# Patient Record
Sex: Female | Born: 1950
Health system: Southern US, Community
[De-identification: ages and names within clinical notes are randomized; demographics above are authoritative.]

## PROBLEM LIST (undated history)

## (undated) DIAGNOSIS — Z9989 Dependence on other enabling machines and devices: Secondary | ICD-10-CM

## (undated) DIAGNOSIS — R413 Other amnesia: Secondary | ICD-10-CM

## (undated) DIAGNOSIS — I10 Essential (primary) hypertension: Secondary | ICD-10-CM

## (undated) DIAGNOSIS — R531 Weakness: Secondary | ICD-10-CM

## (undated) DIAGNOSIS — K279 Peptic ulcer, site unspecified, unspecified as acute or chronic, without hemorrhage or perforation: Secondary | ICD-10-CM

## (undated) DIAGNOSIS — Z972 Presence of dental prosthetic device (complete) (partial): Secondary | ICD-10-CM

## (undated) DIAGNOSIS — J302 Other seasonal allergic rhinitis: Secondary | ICD-10-CM

## (undated) DIAGNOSIS — I5020 Unspecified systolic (congestive) heart failure: Secondary | ICD-10-CM

## (undated) DIAGNOSIS — E119 Type 2 diabetes mellitus without complications: Secondary | ICD-10-CM

## (undated) DIAGNOSIS — I2510 Atherosclerotic heart disease of native coronary artery without angina pectoris: Secondary | ICD-10-CM

## (undated) HISTORY — PX: CHOLECYSTECTOMY: SHX55

---

## 2019-09-02 DIAGNOSIS — I639 Cerebral infarction, unspecified: Secondary | ICD-10-CM

## 2019-09-02 DIAGNOSIS — I219 Acute myocardial infarction, unspecified: Secondary | ICD-10-CM

## 2019-09-02 HISTORY — DX: Cerebral infarction, unspecified: I63.9

## 2019-09-02 HISTORY — DX: Acute myocardial infarction, unspecified: I21.9

## 2019-09-26 ENCOUNTER — Other Ambulatory Visit: Payer: Self-pay

## 2019-09-26 ENCOUNTER — Emergency Department: Payer: Medicare Other

## 2019-09-26 ENCOUNTER — Inpatient Hospital Stay
Admission: EM | Admit: 2019-09-26 | Discharge: 2019-09-27 | DRG: 281 | Disposition: A | Discharge status: Other Acute Inpatient Hospital | Payer: Medicare Other | Attending: Internal Medicine | Admitting: Internal Medicine

## 2019-09-26 DIAGNOSIS — I252 Old myocardial infarction: Secondary | ICD-10-CM

## 2019-09-26 DIAGNOSIS — E876 Hypokalemia: Secondary | ICD-10-CM | POA: Diagnosis present

## 2019-09-26 DIAGNOSIS — I214 Non-ST elevation (NSTEMI) myocardial infarction: Secondary | ICD-10-CM | POA: Diagnosis present

## 2019-09-26 DIAGNOSIS — Z79899 Other long term (current) drug therapy: Secondary | ICD-10-CM | POA: Diagnosis not present

## 2019-09-26 DIAGNOSIS — Z20822 Contact with and (suspected) exposure to covid-19: Secondary | ICD-10-CM | POA: Diagnosis present

## 2019-09-26 DIAGNOSIS — Z823 Family history of stroke: Secondary | ICD-10-CM

## 2019-09-26 DIAGNOSIS — Z8711 Personal history of peptic ulcer disease: Secondary | ICD-10-CM

## 2019-09-26 DIAGNOSIS — Z9049 Acquired absence of other specified parts of digestive tract: Secondary | ICD-10-CM | POA: Diagnosis not present

## 2019-09-26 DIAGNOSIS — Z8249 Family history of ischemic heart disease and other diseases of the circulatory system: Secondary | ICD-10-CM

## 2019-09-26 DIAGNOSIS — R112 Nausea with vomiting, unspecified: Secondary | ICD-10-CM | POA: Diagnosis present

## 2019-09-26 DIAGNOSIS — I5031 Acute diastolic (congestive) heart failure: Secondary | ICD-10-CM

## 2019-09-26 DIAGNOSIS — R778 Other specified abnormalities of plasma proteins: Secondary | ICD-10-CM

## 2019-09-26 DIAGNOSIS — I5022 Chronic systolic (congestive) heart failure: Secondary | ICD-10-CM | POA: Diagnosis present

## 2019-09-26 DIAGNOSIS — I25118 Atherosclerotic heart disease of native coronary artery with other forms of angina pectoris: Secondary | ICD-10-CM | POA: Diagnosis not present

## 2019-09-26 DIAGNOSIS — K30 Functional dyspepsia: Secondary | ICD-10-CM | POA: Diagnosis present

## 2019-09-26 DIAGNOSIS — I2510 Atherosclerotic heart disease of native coronary artery without angina pectoris: Secondary | ICD-10-CM | POA: Diagnosis present

## 2019-09-26 DIAGNOSIS — Z807 Family history of other malignant neoplasms of lymphoid, hematopoietic and related tissues: Secondary | ICD-10-CM

## 2019-09-26 DIAGNOSIS — R0789 Other chest pain: Secondary | ICD-10-CM

## 2019-09-26 HISTORY — DX: Peptic ulcer, site unspecified, unspecified as acute or chronic, without hemorrhage or perforation: K27.9

## 2019-09-26 HISTORY — DX: Other seasonal allergic rhinitis: J30.2

## 2019-09-26 LAB — PROTIME-INR
INR: 1.1 (ref 0.8–1.2)
Prothrombin Time: 13.9 seconds (ref 11.4–15.2)

## 2019-09-26 LAB — CBC
HCT: 44.8 % (ref 36.0–46.0)
Hemoglobin: 15.3 g/dL — ABNORMAL HIGH (ref 12.0–15.0)
MCH: 31.7 pg (ref 26.0–34.0)
MCHC: 34.2 g/dL (ref 30.0–36.0)
MCV: 92.8 fL (ref 80.0–100.0)
Platelets: 229 10*3/uL (ref 150–400)
RBC: 4.83 MIL/uL (ref 3.87–5.11)
RDW: 13.8 % (ref 11.5–15.5)
WBC: 4.1 10*3/uL (ref 4.0–10.5)
nRBC: 0 % (ref 0.0–0.2)

## 2019-09-26 LAB — COMPREHENSIVE METABOLIC PANEL
ALT: 30 U/L (ref 0–44)
AST: 68 U/L — ABNORMAL HIGH (ref 15–41)
Albumin: 3.2 g/dL — ABNORMAL LOW (ref 3.5–5.0)
Alkaline Phosphatase: 85 U/L (ref 38–126)
Anion gap: 16 — ABNORMAL HIGH (ref 5–15)
BUN: 10 mg/dL (ref 8–23)
CO2: 18 mmol/L — ABNORMAL LOW (ref 22–32)
Calcium: 9.2 mg/dL (ref 8.9–10.3)
Chloride: 105 mmol/L (ref 98–111)
Creatinine, Ser: 0.77 mg/dL (ref 0.44–1.00)
GFR calc Af Amer: >60 mL/min (ref >60)
GFR calc non Af Amer: >60 mL/min (ref >60)
Glucose, Bld: 128 mg/dL — ABNORMAL HIGH (ref 70–99)
Potassium: 3 mmol/L — ABNORMAL LOW (ref 3.5–5.1)
Sodium: 139 mmol/L (ref 135–145)
Total Bilirubin: 1.2 mg/dL (ref 0.3–1.2)
Total Protein: 6.3 g/dL — ABNORMAL LOW (ref 6.5–8.1)

## 2019-09-26 LAB — TROPONIN I (HIGH SENSITIVITY)
Troponin I (High Sensitivity): 674 ng/L (ref <18)
Troponin I (High Sensitivity): 567 ng/L (ref <18)

## 2019-09-26 LAB — HEMOGLOBIN A1C
Hgb A1c MFr Bld: 5.9 % — ABNORMAL HIGH (ref 4.8–5.6)
Mean Plasma Glucose: 122.63 mg/dL

## 2019-09-26 LAB — LIPASE, BLOOD: Lipase: 25 U/L (ref 11–51)

## 2019-09-26 LAB — APTT: aPTT: 40 seconds — ABNORMAL HIGH (ref 24–36)

## 2019-09-26 MED ORDER — IOHEXOL 300 MG/ML  SOLN
100.0000 mL | Freq: Once | INTRAMUSCULAR | Status: AC | PRN
  Administered 2019-09-26: 100 mL via INTRAVENOUS

## 2019-09-26 MED ORDER — SODIUM CHLORIDE 0.9 % IV SOLN: INTRAVENOUS | Status: DC

## 2019-09-26 MED ORDER — HEPARIN (PORCINE) 25000 UT/250ML-% IV SOLN
1000.0000 [IU]/h | INTRAVENOUS | Status: DC
  Administered 2019-09-26: 1000 [IU]/h via INTRAVENOUS
  Filled 2019-09-26 (×2): qty 250

## 2019-09-26 MED ORDER — ALPRAZOLAM 0.5 MG PO TABS: 0.2500 mg | ORAL_TABLET | Freq: Two times a day (BID) | ORAL | Status: DC | PRN

## 2019-09-26 MED ORDER — ONDANSETRON HCL 4 MG/2ML IJ SOLN
4.0000 mg | Freq: Once | INTRAMUSCULAR | Status: AC
  Administered 2019-09-26: 4 mg via INTRAVENOUS
  Filled 2019-09-26: qty 2

## 2019-09-26 MED ORDER — NITROGLYCERIN 0.4 MG SL SUBL: 0.4000 mg | SUBLINGUAL_TABLET | SUBLINGUAL | Status: DC | PRN

## 2019-09-26 MED ORDER — SODIUM CHLORIDE 0.9% FLUSH
3.0000 mL | Freq: Once | INTRAVENOUS | Status: AC
  Administered 2019-09-26: 3 mL via INTRAVENOUS

## 2019-09-26 MED ORDER — METOPROLOL TARTRATE 5 MG/5ML IV SOLN
5.0000 mg | Freq: Once | INTRAVENOUS | Status: AC
  Administered 2019-09-26: 5 mg via INTRAVENOUS
  Filled 2019-09-26: qty 5

## 2019-09-26 MED ORDER — ASPIRIN EC 81 MG PO TBEC
81.0000 mg | DELAYED_RELEASE_TABLET | Freq: Every day | ORAL | Status: DC
  Administered 2019-09-27: 81 mg via ORAL
  Filled 2019-09-26: qty 1

## 2019-09-26 MED ORDER — ZOLPIDEM TARTRATE 5 MG PO TABS: 5.0000 mg | ORAL_TABLET | Freq: Every evening | ORAL | Status: DC | PRN

## 2019-09-26 MED ORDER — SODIUM CHLORIDE 0.9 % IV BOLUS
500.0000 mL | Freq: Once | INTRAVENOUS | Status: AC
  Administered 2019-09-26: 500 mL via INTRAVENOUS

## 2019-09-26 MED ORDER — METOPROLOL TARTRATE 25 MG PO TABS
12.5000 mg | ORAL_TABLET | Freq: Two times a day (BID) | ORAL | Status: DC
  Administered 2019-09-26 – 2019-09-27 (×2): 12.5 mg via ORAL
  Filled 2019-09-26 (×2): qty 1

## 2019-09-26 MED ORDER — ACETAMINOPHEN 325 MG PO TABS: 650.0000 mg | ORAL_TABLET | ORAL | Status: DC | PRN

## 2019-09-26 MED ORDER — ONDANSETRON HCL 4 MG/2ML IJ SOLN: 4.0000 mg | Freq: Four times a day (QID) | INTRAMUSCULAR | Status: DC | PRN

## 2019-09-26 MED ORDER — PROMETHAZINE HCL 25 MG/ML IJ SOLN: 12.5000 mg | Freq: Four times a day (QID) | INTRAMUSCULAR | Status: DC | PRN

## 2019-09-26 MED ORDER — HEPARIN BOLUS VIA INFUSION
3800.0000 [IU] | Freq: Once | INTRAVENOUS | Status: AC
  Administered 2019-09-26: 3800 [IU] via INTRAVENOUS
  Filled 2019-09-26: qty 3800

## 2019-09-26 MED ORDER — POTASSIUM CHLORIDE CRYS ER 20 MEQ PO TBCR
40.0000 meq | EXTENDED_RELEASE_TABLET | Freq: Once | ORAL | Status: AC
  Administered 2019-09-26: 40 meq via ORAL
  Filled 2019-09-26: qty 2

## 2019-09-26 MED ORDER — ASPIRIN 81 MG PO CHEW
324.0000 mg | CHEWABLE_TABLET | Freq: Once | ORAL | Status: AC
  Administered 2019-09-26: 324 mg via ORAL
  Filled 2019-09-26: qty 4

## 2019-09-26 MED ORDER — ATORVASTATIN CALCIUM 80 MG PO TABS
80.0000 mg | ORAL_TABLET | Freq: Every day | ORAL | Status: DC
  Administered 2019-09-26 – 2019-09-27 (×2): 80 mg via ORAL
  Filled 2019-09-26: qty 1
  Filled 2019-09-26: qty 4

## 2019-09-26 NOTE — H&P (Addendum)
[place] [facility]   PATIENT NAME: Brittney Kennedy    MR#:  [id number]  DATE OF BIRTH:  [birth year]  DATE OF ADMISSION:  09/26/2019  PRIMARY CARE PHYSICIAN: Patient, No Pcp Per   REQUESTING/REFERRING PHYSICIAN: Willy Eddy, MD  CHIEF COMPLAINT:   Chief Complaint  Patient presents with  . Nausea  . Emesis    HISTORY OF PRESENT ILLNESS:  Brittney Kennedy  is a 69 y.o. Caucasian female with no known medical problems, who presented to the emergency room with cancer of intermittent nausea and vomiting over the last week as well as epigastric pain that started about a couple weeks ago and has been intermittent as well.  Symptoms have been significantly worse since yesterday.  She had nausea and vomiting x3 yesterday with significant diminished p.o. intake today.  She admits to dry cough without wheezing or dyspnea.  Her pain has been epigastric and substernal area with no radiation.  She denies any leg pain, recent travels or surgeries but does have lower leg and ankle edema.   No fever or chills.  She admits to constipation and denies any melena or bright red bleeding per rectum.  No other bleeding diathesis.  Upon presentation to the emergency room, blood pressure was 154/66 with otherwise normal vital signs.  Labs revealed low potassium of 3 and serum lipase was 25.  Anion gap was 16 and troponin 6.3 with albumin of 3.2.  Initial troponin I was 674 and later 567.  CBC was unremarkable.  INR is 1.1.  COVID-19 PCR is currently pending.  Two-view chest x-ray showed borderline cardiomegaly and bronchitic changes with minimal atelectasis and small pleural effusion at the left base.  EKG showed Sinus tachycardia with rate 103 with Q waves anteroseptally and T wave inversion laterally.  The patient was given 40 mEq p.o. potassium chloride and 4 of aspirin as well as 4 mg of IV Zofran and 500 mill IV normal saline bolus.  She was then started on IV heparin with bolus and drip after  results of her troponin I.  She will be admitted to a telemetry bed for further evaluation and management. PAST MEDICAL HISTORY:  History reviewed. No pertinent past medical history.  She denied any previous medical problems.  PAST SURGICAL HISTORY:   Past Surgical History:  Procedure Laterality Date  . CHOLECYSTECTOMY    Left rotator cuff tear repair.  SOCIAL HISTORY:   Social History   Tobacco Use  . Smoking status: Never Smoker  . Smokeless tobacco: Never Used  Substance Use Topics  . Alcohol use: Never    FAMILY HISTORY:  No family history on file. Her mother died from CVA.  Paternal grandmother had MI. DRUG ALLERGIES:   Allergies  Allergen Reactions  . No [employer] Food Allergies Anaphylaxis and Hives    Raw onions    REVIEW OF SYSTEMS:   ROS As per history of present illness. All pertinent systems were reviewed above. Constitutional,  HEENT, cardiovascular, respiratory, GI, GU, musculoskeletal, neuro, psychiatric, endocrine,  integumentary and hematologic systems were reviewed and are otherwise  negative/unremarkable except for positive findings mentioned above in the HPI.   MEDICATIONS AT HOME:   Prior to Admission medications   Medication Sig Start Date End Date Taking? Authorizing Provider  Ascorbic Acid (VITAMIN C) 1000 MG tablet Take 1,000 mg by mouth daily.   Yes [provider]  cholecalciferol (VITAMIN D3) 25 MCG (1000 UNIT) tablet Take 1,000 Units by mouth daily.  Yes [provider]  Echinacea 125 MG CAPS Take 2 capsules by mouth in the morning and at bedtime.   Yes [provider]  loratadine (CLARITIN) 10 MG tablet Take 10 mg by mouth daily.   Yes [provider]  Multiple Vitamin (MULTIVITAMIN) capsule Take 1 capsule by mouth daily.   Yes [provider]      VITAL SIGNS:  Blood pressure (!) 156/93, pulse (!) 111, temperature 97.9 F (36.6 C), temperature source Oral, resp. rate 20, height 5\' 2"   (1.575 m), weight 68 kg, SpO2 100 %.  PHYSICAL EXAMINATION:  Physical Exam  GENERAL:  69 y.o.-year-old Caucasian female patient lying in the bed with no acute distress.  EYES: Pupils equal, round, reactive to light and accommodation. No scleral icterus. Extraocular muscles intact.  HEENT: Head atraumatic, normocephalic. Oropharynx and nasopharynx clear.  NECK:  Supple, no jugular venous distention. No thyroid enlargement, no tenderness.  LUNGS: Normal breath sounds bilaterally, no wheezing, rales,rhonchi or crepitation. No use of accessory muscles of respiration.  CARDIOVASCULAR: Regular rate and rhythm, S1, S2 normal. No murmurs, rubs, or gallops.  ABDOMEN: Soft, nondistended, nontender. Bowel sounds present. No organomegaly or mass.  EXTREMITIES: 1-2+ bilateral ankle and lower leg pitting edema, with no cyanosis, or clubbing.  NEUROLOGIC: Cranial nerves II through XII are intact. Muscle strength 5/5 in all extremities. Sensation intact. Gait not checked.  PSYCHIATRIC: The patient is alert and oriented x 3.  Normal affect and good eye contact. SKIN: No obvious rash, lesion, or ulcer.   LABORATORY PANEL:   CBC Recent Labs  Lab 09/26/19 1647  WBC 4.1  HGB 15.3*  HCT 44.8  PLT 229   ------------------------------------------------------------------------------------------------------------------  Chemistries  Recent Labs  Lab 09/26/19 1647  NA 139  K 3.0*  CL 105  CO2 18*  GLUCOSE 128*  BUN 10  CREATININE 0.77  CALCIUM 9.2  AST 68*  ALT 30  ALKPHOS 85  BILITOT 1.2   ------------------------------------------------------------------------------------------------------------------  Cardiac Enzymes No results for input(s): TROPONINI in the last 168 hours. ------------------------------------------------------------------------------------------------------------------  RADIOLOGY:  DG Chest 2 View  Result Date: 09/26/2019 CLINICAL DATA:  Upper epigastric pain,  nausea, vomiting for couple weeks, unable to keep much down EXAM: CHEST - 2 VIEW COMPARISON:  None FINDINGS: Borderline enlargement of cardiac silhouette. Mediastinal contours and pulmonary vascularity normal. Atherosclerotic calcification aorta. Bronchitic changes without pulmonary infiltrate. Minimal atelectasis and pleural effusion at LEFT base. No pneumothorax. Bones demineralized. IMPRESSION: Borderline enlargement of cardiac silhouette. Bronchitic changes with minimal atelectasis and small pleural effusion at LEFT base. Electronically Signed   By: Lavonia Dana M.D.   On: 09/26/2019 20:19   CT ABDOMEN PELVIS W CONTRAST  Result Date: 09/26/2019 CLINICAL DATA:  69 year old female with nausea vomiting. Concern for bowel obstruction. EXAM: CT ABDOMEN AND PELVIS WITH CONTRAST TECHNIQUE: Multidetector CT imaging of the abdomen and pelvis was performed using the standard protocol following bolus administration of intravenous contrast. CONTRAST:  114mL OMNIPAQUE IOHEXOL 300 MG/ML  SOLN COMPARISON:  None. FINDINGS: Lower chest: Partially visualized small bilateral pleural effusions with associated partial compressive atelectasis the lower lobes. Pneumonia is not excluded. Clinical correlation is recommended. There is mild cardiomegaly. No intra-abdominal free air or free fluid. Hepatobiliary: Fatty infiltration of the liver with findings of early cirrhosis. No intrahepatic biliary ductal dilatation. Cholecystectomy. No retained calcified stone noted the central CBD. Pancreas: Unremarkable. No pancreatic ductal dilatation or surrounding inflammatory changes. Spleen: Normal in size without focal abnormality. Adrenals/Urinary Tract: The adrenal glands are unremarkable.  There is no hydronephrosis on either side. There is symmetric enhancement and excretion of contrast by both kidneys. The visualized ureters and urinary bladder appear unremarkable. Stomach/Bowel: There is no bowel obstruction or active inflammation. The  appendix is normal. Vascular/Lymphatic: Moderate aortoiliac atherosclerotic disease. IVC is unremarkable. No portal venous gas. Mildly enlarged lymph node in the porta hepatic measuring 14 mm. Reproductive: The uterus and ovaries are grossly unremarkable. Other: None Musculoskeletal: Osteopenia with degenerative changes of the spine. There is a transitional anatomy with grade 1 anterolisthesis. No acute osseous pathology. IMPRESSION: 1. No acute intra-abdominal or pelvic pathology. No bowel obstruction. Normal appendix. 2. Fatty liver with findings of early cirrhosis. 3. Partially visualized small bilateral pleural effusions and partial compressive atelectasis of the lower lobes. Pneumonia is not excluded. Clinical correlation is recommended. 4. Aortic Atherosclerosis (ICD10-I70.0). Electronically Signed   By: Elgie Collard M.D.   On: 09/26/2019 19:21      IMPRESSION AND PLAN:   1.  Non-ST elevation myocardial infarction. -The patient will be admitted to a telemetry bed. -We will continue her on IV heparin drip. -We will place her on aspirin as well as as needed sublingual nitroglycerin and morphine sulfate for pain. -We will add high-dose statin therapy and give beta-blocker therapy with IV and p.o. -2D echo will be obtained in a.m. -Cardiology consult will be obtained in a.m.  with [facility].  I notified Dr. Chilton Si about the patient.  2.  Hypokalemia. -Potassium will be replaced and magnesium level will be checked.  3.  Recurrent nausea and vomiting. -The patient will be placed on p.r.n. Antiemetics.  4.  Possible new onset acute CHF likely diastolic. -The patient will be placed on diuresis with IV Lasix. -We will check  BNP. -2D echo will be obtained and cardiology consult as mentioned above.  5.  DVT prophylaxis. -The patient will be placed on IV heparin drip as mentioned above.  All the records are reviewed and case discussed with ED provider. The plan of care was discussed  in details with the patient (and family). I answered all questions. The patient agreed to proceed with the above mentioned plan. Further management will depend upon hospital course.   CODE STATUS: Full.    The patient is admitted to inpatient status due to the intensity of medical problems, potential risks involved and management requirement that will necessitate more than 2 midnights.  TOTAL TIME TAKING CARE OF THIS PATIENT: 55 minutes.    Hannah Beat M.D on 09/26/2019 at 10:51 PM  [facility]   From 7 PM-7 AM, contact night-coverage [email or web address]  CC: Primary care physician; Patient, No Pcp Per   Note: This dictation was prepared with Dragon dictation along with smaller phrase technology. Any transcriptional errors that result from this process are unintentional.

## 2019-09-26 NOTE — Progress Notes (Signed)
Report called to shari 2A

## 2019-09-26 NOTE — ED Notes (Signed)
This RN attempted IV x2 for second line, unable to get access. Pt is difficult stick. IV team consult placed.

## 2019-09-26 NOTE — ED Triage Notes (Signed)
Pt comes via POV from home with c/o upper gastric pain, N/V. Pt states this has been going on for couple weeks now.  Pt states she hasn't been able to keep much of anything down. Pt not sure if it is her ulcer or what.

## 2019-09-26 NOTE — ED Notes (Signed)
Pt to CT

## 2019-09-26 NOTE — ED Provider Notes (Signed)
[facility] Emergency Department Provider Note    First MD Initiated Contact with Patient 09/26/19 1722     (approximate)  I have reviewed the triage vital signs and the nursing notes.   HISTORY  Chief Complaint Nausea and Emesis    HPI Zen Felling is a 69 y.o. female history of cholecystectomy presents the ER for little over a week of progressively worsening nausea and vomiting having difficulty keeping anything down associated with epigastric pain.  No diarrhea.  She is still passing gas.  Denies any flank pain.  No pain with taking deep inspiration.  Denies any chest pain.  Has not taken anything for discomfort or nausea at home.    History reviewed. No pertinent past medical history. No family history on file. Past Surgical History:  Procedure Laterality Date  . CHOLECYSTECTOMY     Patient Active Problem List   Diagnosis Date Noted  . NSTEMI (non-ST elevated myocardial infarction) (HCC) 09/26/2019      Prior to Admission medications   Medication Sig Start Date End Date Taking? Authorizing Provider  Ascorbic Acid (VITAMIN C) 1000 MG tablet Take 1,000 mg by mouth daily.   Yes [provider]  cholecalciferol (VITAMIN D3) 25 MCG (1000 UNIT) tablet Take 1,000 Units by mouth daily.   Yes [provider]  Echinacea 125 MG CAPS Take 2 capsules by mouth in the morning and at bedtime.   Yes [provider]  loratadine (CLARITIN) 10 MG tablet Take 10 mg by mouth daily.   Yes [provider]  Multiple Vitamin (MULTIVITAMIN) capsule Take 1 capsule by mouth daily.   Yes [provider]    Allergies No [employer]alt[employer] food allergies    Social History Social History   Tobacco Use  . Smoking status: Never Smoker  . Smokeless tobacco: Never Used  Substance Use Topics  . Alcohol use: Never  . Drug use: Never    Review of Systems Patient denies headaches, rhinorrhea, blurry vision, numbness, shortness of  breath, chest pain, edema, cough, abdominal pain, nausea, vomiting, diarrhea, dysuria, fevers, rashes or hallucinations unless otherwise stated above in HPI. ____________________________________________   PHYSICAL EXAM:  VITAL SIGNS: Vitals:   09/26/19 2227 09/26/19 2336  BP: (!) 156/93 (!) 135/93  Pulse: (!) 111 92  Resp: 20   Temp: 97.9 F (36.6 C) 98.5 F (36.9 C)  SpO2:  100%    Constitutional: Alert and oriented.  Eyes: Conjunctivae are normal.  Head: Atraumatic. Nose: No congestion/rhinnorhea. Mouth/Throat: Mucous membranes are moist.   Neck: No stridor. Painless ROM.  Cardiovascular: Normal rate, regular rhythm. Grossly normal heart sounds.  Good peripheral circulation. Respiratory: Normal respiratory effort.  No retractions. Lungs CTAB. Gastrointestinal: Soft , limited exam 2/2 obesity. No distention. No abdominal bruits. No CVA tenderness. Genitourinary:  Musculoskeletal: No lower extremity tenderness nor edema.  No joint effusions. Neurologic:  Normal speech and language. No gross focal neurologic deficits are appreciated. No facial droop Skin:  Skin is warm, dry and intact. No rash noted. Psychiatric: Mood and affect are normal. Speech and behavior are normal.  ____________________________________________   LABS (all labs ordered are listed, but only abnormal results are displayed)  Results for orders placed or performed during the hospital encounter of 09/26/19 (from the past 24 hour(s))  Lipase, blood     Status: None   Collection Time: 09/26/19  4:47 PM  Result Value Ref Range   Lipase 25 11 - 51 U/L  Comprehensive metabolic panel  Status: Abnormal   Collection Time: 09/26/19  4:47 PM  Result Value Ref Range   Sodium 139 135 - 145 mmol/L   Potassium 3.0 (L) 3.5 - 5.1 mmol/L   Chloride 105 98 - 111 mmol/L   CO2 18 (L) 22 - 32 mmol/L   Glucose, Bld 128 (H) 70 - 99 mg/dL   BUN 10 8 - 23 mg/dL   Creatinine, Ser 0.77 0.44 - 1.00 mg/dL   Calcium 9.2 8.9  - 10.3 mg/dL   Total Protein 6.3 (L) 6.5 - 8.1 g/dL   Albumin 3.2 (L) 3.5 - 5.0 g/dL   AST 68 (H) 15 - 41 U/L   ALT 30 0 - 44 U/L   Alkaline Phosphatase 85 38 - 126 U/L   Total Bilirubin 1.2 0.3 - 1.2 mg/dL   GFR calc non Af Amer >60 >60 mL/min   GFR calc Af Amer >60 >60 mL/min   Anion gap 16 (H) 5 - 15  CBC     Status: Abnormal   Collection Time: 09/26/19  4:47 PM  Result Value Ref Range   WBC 4.1 4.0 - 10.5 K/uL   RBC 4.83 3.87 - 5.11 MIL/uL   Hemoglobin 15.3 (H) 12.0 - 15.0 g/dL   HCT 44.8 36.0 - 46.0 %   MCV 92.8 80.0 - 100.0 fL   MCH 31.7 26.0 - 34.0 pg   MCHC 34.2 30.0 - 36.0 g/dL   RDW 13.8 11.5 - 15.5 %   Platelets 229 150 - 400 K/uL   nRBC 0.0 0.0 - 0.2 %  Troponin I (High Sensitivity)     Status: Abnormal   Collection Time: 09/26/19  6:48 PM  Result Value Ref Range   Troponin I (High Sensitivity) 674 (HH) <18 ng/L  Troponin I (High Sensitivity)     Status: Abnormal   Collection Time: 09/26/19  8:48 PM  Result Value Ref Range   Troponin I (High Sensitivity) 567 (HH) <18 ng/L  Hemoglobin A1c     Status: Abnormal   Collection Time: 09/26/19  8:48 PM  Result Value Ref Range   Hgb A1c MFr Bld 5.9 (H) 4.8 - 5.6 %   Mean Plasma Glucose 122.63 mg/dL  Protime-INR     Status: None   Collection Time: 09/26/19  8:48 PM  Result Value Ref Range   Prothrombin Time 13.9 11.4 - 15.2 seconds   INR 1.1 0.8 - 1.2  APTT     Status: Abnormal   Collection Time: 09/26/19  8:48 PM  Result Value Ref Range   aPTT 40 (H) 24 - 36 seconds   ____________________________________________  EKG My review and personal interpretation at Time: 16:50   Indication: nausea  Rate: 100  Rhythm: sinus Axis: normal Other: poor r wave progression, non specific st abn ____________________________________________  RADIOLOGY  I personally reviewed all radiographic images ordered to evaluate for the above acute complaints and reviewed radiology reports and findings.  These findings were personally  discussed with the patient.  Please see medical record for radiology report.  ____________________________________________   PROCEDURES  Procedure(s) performed:  .Critical Care Performed by: Merlyn Lot, MD Authorized by: Merlyn Lot, MD   Critical care provider statement:    Critical care time (minutes):  30   Critical care time was exclusive of:  Separately billable procedures and treating other patients   Critical care was necessary to treat or prevent imminent or life-threatening deterioration of the following conditions:  Cardiac failure   Critical care was time  spent personally by me on the following activities:  Development of treatment plan with patient or surrogate, discussions with consultants, evaluation of patient's response to treatment, examination of patient, obtaining history from patient or surrogate, ordering and performing treatments and interventions, ordering and review of laboratory studies, ordering and review of radiographic studies, pulse oximetry, re-evaluation of patient's condition and review of old charts      Critical Care performed: yes ____________________________________________   INITIAL IMPRESSION / ASSESSMENT AND PLAN / ED COURSE  Pertinent labs & imaging results that were available during my care of the patient were reviewed by me and considered in my medical decision making (see chart for details).   DDX: enteritis, gastritis, pud, colitis, sbo, diverticulitis  Brittney Kennedy is a 69 y.o. who presents to the ED with symptoms as described above.  Patient presented initially with complaint nausea vomiting epigastric pain since yesterday.  Blood work reassuring.  She denied any chest pain on my initial evaluation therefore CT imaging was ordered to evaluate for acute intra-abdominal process.  Troponin was added on to blood work.  CT imaging does not show any evidence of acute abnormality troponin was elevated at 600.  Further questioning  she is stating that she was having some chest discomfort yesterday related to the nausea and epigastric discomfort.  I suspect this is some atypical presentation of ACS will give aspirin as well as heparinize.  She is currently chest pain-free.  Has some nonspecific changes on EKG initially.  Clinical Course as of Mar 26 0001  Thu Sep 26, 2019  5456 Repeat EKG does not show any significant changes from prior but there are some depressions in lateral leads concerning for NSTEMI given her presentation.  She is current pain-free.  States that she was having some chest pain and pressure yesterday.  CT imaging is reassuring.  Will order aspirin as well as heparin.  Will discuss with hospitalist.   [PR]    Clinical Course User Index [PR] Willy Eddy, MD    The patient was evaluated in Emergency Department today for the symptoms described in the history of present illness. He/she was evaluated in the context of the global COVID-19 pandemic, which necessitated consideration that the patient might be at risk for infection with the SARS-CoV-2 virus that causes COVID-19. Institutional protocols and algorithms that pertain to the evaluation of patients at risk for COVID-19 are in a state of rapid change based on information released by regulatory bodies including the CDC and federal and state organizations. These policies and algorithms were followed during the patient's care in the ED.  As part of my medical decision making, I reviewed the following data within the electronic MEDICAL RECORD NUMBER Nursing notes reviewed and incorporated, Labs reviewed, notes from prior ED visits and [place] Controlled Substance Database   ____________________________________________   FINAL CLINICAL IMPRESSION(S) / ED DIAGNOSES  Final diagnoses:  Nausea and vomiting, intractability of vomiting not specified, unspecified vomiting type  Atypical chest pain  Elevated troponin I level      NEW MEDICATIONS STARTED DURING  THIS VISIT:  Current Discharge Medication List       Note:  This document was prepared using Dragon voice recognition software and may include unintentional dictation errors.    Willy Eddy, MD 09/27/19 0001

## 2019-09-26 NOTE — Progress Notes (Signed)
ANTICOAGULATION CONSULT NOTE - Initial Consult  Pharmacy Consult for Heparin  Indication: chest pain/ACS  Not on File  Patient Measurements: Height: 5\' 2"  (157.5 cm) Weight: 150 lb (68 kg) IBW/kg (Calculated) : 50.1 Heparin Dosing Weight: 64.2 kg   Vital Signs: Temp: 97.9 F (36.6 C) (03/25 1644) BP: 144/90 (03/25 2028) Pulse Rate: 101 (03/25 2028)  Labs: Recent Labs    09/26/19 1647 09/26/19 1848  HGB 15.3*  --   HCT 44.8  --   PLT 229  --   CREATININE 0.77  --   TROPONINIHS  --  674*    Estimated Creatinine Clearance: 61.7 mL/min (by C-G formula based on SCr of 0.77 mg/dL).   Medical History: History reviewed. No pertinent past medical history.  Medications:  (Not in a hospital admission)   Assessment: Pharmacy consulted to dose heparin in this 69 year old female admitted with ACS/NSTEMI.  CrCl = 61.7 ml/min    No prior anticoag noted.   Goal of Therapy:  Heparin level 0.3-0.7 units/ml Monitor platelets by anticoagulation protocol: Yes   Plan:  Give 3800 units bolus x 1 Start heparin infusion at 1000 units/hr Check anti-Xa level in 8 hours and daily while on heparin Continue to monitor H&H and platelets  Agape Hardiman D 09/26/2019,8:35 PM

## 2019-09-27 ENCOUNTER — Inpatient Hospital Stay (HOSPITAL_COMMUNITY)
Admission: AD | Admit: 2019-09-27 | Discharge: 2019-10-16 | DRG: 246 | Disposition: A | Discharge status: Rehabilitation Facility | Payer: Medicare Other | Source: Other Acute Inpatient Hospital | Attending: Internal Medicine | Admitting: Internal Medicine

## 2019-09-27 ENCOUNTER — Encounter: Payer: Self-pay | Admitting: Internal Medicine

## 2019-09-27 ENCOUNTER — Inpatient Hospital Stay (HOSPITAL_COMMUNITY)
Admit: 2019-09-27 | Discharge: 2019-09-27 | Disposition: A | Discharge status: Home / Self Care | Payer: Medicare Other | Attending: Family Medicine | Admitting: Family Medicine

## 2019-09-27 ENCOUNTER — Encounter
Admission: EM | Disposition: A | Discharge status: Other Acute Inpatient Hospital | Payer: Self-pay | Source: Home / Self Care | Attending: Internal Medicine

## 2019-09-27 ENCOUNTER — Encounter: Payer: Self-pay | Admitting: Family Medicine

## 2019-09-27 DIAGNOSIS — I110 Hypertensive heart disease with heart failure: Secondary | ICD-10-CM | POA: Diagnosis present

## 2019-09-27 DIAGNOSIS — I63511 Cerebral infarction due to unspecified occlusion or stenosis of right middle cerebral artery: Secondary | ICD-10-CM | POA: Diagnosis not present

## 2019-09-27 DIAGNOSIS — R778 Other specified abnormalities of plasma proteins: Secondary | ICD-10-CM

## 2019-09-27 DIAGNOSIS — Z6829 Body mass index (BMI) 29.0-29.9, adult: Secondary | ICD-10-CM

## 2019-09-27 DIAGNOSIS — I361 Nonrheumatic tricuspid (valve) insufficiency: Secondary | ICD-10-CM | POA: Diagnosis not present

## 2019-09-27 DIAGNOSIS — R109 Unspecified abdominal pain: Secondary | ICD-10-CM

## 2019-09-27 DIAGNOSIS — Y92230 Patient room in hospital as the place of occurrence of the external cause: Secondary | ICD-10-CM | POA: Diagnosis not present

## 2019-09-27 DIAGNOSIS — R0989 Other specified symptoms and signs involving the circulatory and respiratory systems: Secondary | ICD-10-CM | POA: Diagnosis not present

## 2019-09-27 DIAGNOSIS — R350 Frequency of micturition: Secondary | ICD-10-CM | POA: Diagnosis not present

## 2019-09-27 DIAGNOSIS — R1312 Dysphagia, oropharyngeal phase: Secondary | ICD-10-CM | POA: Diagnosis present

## 2019-09-27 DIAGNOSIS — R414 Neurologic neglect syndrome: Secondary | ICD-10-CM | POA: Diagnosis not present

## 2019-09-27 DIAGNOSIS — R4701 Aphasia: Secondary | ICD-10-CM | POA: Diagnosis not present

## 2019-09-27 DIAGNOSIS — I69392 Facial weakness following cerebral infarction: Secondary | ICD-10-CM | POA: Diagnosis not present

## 2019-09-27 DIAGNOSIS — G4700 Insomnia, unspecified: Secondary | ICD-10-CM | POA: Diagnosis not present

## 2019-09-27 DIAGNOSIS — H16201 Unspecified keratoconjunctivitis, right eye: Secondary | ICD-10-CM | POA: Diagnosis present

## 2019-09-27 DIAGNOSIS — I2510 Atherosclerotic heart disease of native coronary artery without angina pectoris: Secondary | ICD-10-CM

## 2019-09-27 DIAGNOSIS — Z20822 Contact with and (suspected) exposure to covid-19: Secondary | ICD-10-CM | POA: Diagnosis not present

## 2019-09-27 DIAGNOSIS — E46 Unspecified protein-calorie malnutrition: Secondary | ICD-10-CM | POA: Diagnosis not present

## 2019-09-27 DIAGNOSIS — Z01810 Encounter for preprocedural cardiovascular examination: Secondary | ICD-10-CM | POA: Diagnosis not present

## 2019-09-27 DIAGNOSIS — Z951 Presence of aortocoronary bypass graft: Secondary | ICD-10-CM

## 2019-09-27 DIAGNOSIS — I10 Essential (primary) hypertension: Secondary | ICD-10-CM | POA: Diagnosis not present

## 2019-09-27 DIAGNOSIS — I214 Non-ST elevation (NSTEMI) myocardial infarction: Secondary | ICD-10-CM | POA: Diagnosis not present

## 2019-09-27 DIAGNOSIS — I25118 Atherosclerotic heart disease of native coronary artery with other forms of angina pectoris: Secondary | ICD-10-CM | POA: Diagnosis present

## 2019-09-27 DIAGNOSIS — E440 Moderate protein-calorie malnutrition: Secondary | ICD-10-CM | POA: Diagnosis present

## 2019-09-27 DIAGNOSIS — R7303 Prediabetes: Secondary | ICD-10-CM | POA: Diagnosis not present

## 2019-09-27 DIAGNOSIS — I472 Ventricular tachycardia: Secondary | ICD-10-CM | POA: Diagnosis present

## 2019-09-27 DIAGNOSIS — I5021 Acute systolic (congestive) heart failure: Secondary | ICD-10-CM | POA: Diagnosis present

## 2019-09-27 DIAGNOSIS — E785 Hyperlipidemia, unspecified: Secondary | ICD-10-CM | POA: Diagnosis present

## 2019-09-27 DIAGNOSIS — Z823 Family history of stroke: Secondary | ICD-10-CM

## 2019-09-27 DIAGNOSIS — R1319 Other dysphagia: Secondary | ICD-10-CM | POA: Diagnosis not present

## 2019-09-27 DIAGNOSIS — I69354 Hemiplegia and hemiparesis following cerebral infarction affecting left non-dominant side: Secondary | ICD-10-CM | POA: Diagnosis present

## 2019-09-27 DIAGNOSIS — K922 Gastrointestinal hemorrhage, unspecified: Secondary | ICD-10-CM | POA: Diagnosis not present

## 2019-09-27 DIAGNOSIS — Z4659 Encounter for fitting and adjustment of other gastrointestinal appliance and device: Secondary | ICD-10-CM

## 2019-09-27 DIAGNOSIS — Z8711 Personal history of peptic ulcer disease: Secondary | ICD-10-CM | POA: Diagnosis not present

## 2019-09-27 DIAGNOSIS — N390 Urinary tract infection, site not specified: Secondary | ICD-10-CM | POA: Diagnosis not present

## 2019-09-27 DIAGNOSIS — Z807 Family history of other malignant neoplasms of lymphoid, hematopoietic and related tissues: Secondary | ICD-10-CM

## 2019-09-27 DIAGNOSIS — I63413 Cerebral infarction due to embolism of bilateral middle cerebral arteries: Secondary | ICD-10-CM | POA: Diagnosis not present

## 2019-09-27 DIAGNOSIS — I959 Hypotension, unspecified: Secondary | ICD-10-CM | POA: Diagnosis not present

## 2019-09-27 DIAGNOSIS — G8194 Hemiplegia, unspecified affecting left nondominant side: Secondary | ICD-10-CM | POA: Diagnosis not present

## 2019-09-27 DIAGNOSIS — E8809 Other disorders of plasma-protein metabolism, not elsewhere classified: Secondary | ICD-10-CM | POA: Diagnosis not present

## 2019-09-27 DIAGNOSIS — K269 Duodenal ulcer, unspecified as acute or chronic, without hemorrhage or perforation: Secondary | ICD-10-CM | POA: Diagnosis not present

## 2019-09-27 DIAGNOSIS — B9689 Other specified bacterial agents as the cause of diseases classified elsewhere: Secondary | ICD-10-CM | POA: Diagnosis not present

## 2019-09-27 DIAGNOSIS — N179 Acute kidney failure, unspecified: Secondary | ICD-10-CM | POA: Diagnosis not present

## 2019-09-27 DIAGNOSIS — T40605A Adverse effect of unspecified narcotics, initial encounter: Secondary | ICD-10-CM | POA: Diagnosis not present

## 2019-09-27 DIAGNOSIS — I63411 Cerebral infarction due to embolism of right middle cerebral artery: Secondary | ICD-10-CM | POA: Diagnosis not present

## 2019-09-27 DIAGNOSIS — Z955 Presence of coronary angioplasty implant and graft: Secondary | ICD-10-CM | POA: Diagnosis not present

## 2019-09-27 DIAGNOSIS — I69322 Dysarthria following cerebral infarction: Secondary | ICD-10-CM | POA: Diagnosis not present

## 2019-09-27 DIAGNOSIS — E1151 Type 2 diabetes mellitus with diabetic peripheral angiopathy without gangrene: Secondary | ICD-10-CM | POA: Diagnosis not present

## 2019-09-27 DIAGNOSIS — Z9109 Other allergy status, other than to drugs and biological substances: Secondary | ICD-10-CM | POA: Diagnosis not present

## 2019-09-27 DIAGNOSIS — R112 Nausea with vomiting, unspecified: Secondary | ICD-10-CM

## 2019-09-27 DIAGNOSIS — R410 Disorientation, unspecified: Secondary | ICD-10-CM | POA: Diagnosis not present

## 2019-09-27 DIAGNOSIS — I749 Embolism and thrombosis of unspecified artery: Secondary | ICD-10-CM | POA: Diagnosis not present

## 2019-09-27 DIAGNOSIS — G9341 Metabolic encephalopathy: Secondary | ICD-10-CM | POA: Diagnosis not present

## 2019-09-27 DIAGNOSIS — I428 Other cardiomyopathies: Secondary | ICD-10-CM | POA: Diagnosis present

## 2019-09-27 DIAGNOSIS — Y838 Other surgical procedures as the cause of abnormal reaction of the patient, or of later complication, without mention of misadventure at the time of the procedure: Secondary | ICD-10-CM | POA: Diagnosis present

## 2019-09-27 DIAGNOSIS — K264 Chronic or unspecified duodenal ulcer with hemorrhage: Secondary | ICD-10-CM | POA: Diagnosis not present

## 2019-09-27 DIAGNOSIS — R7309 Other abnormal glucose: Secondary | ICD-10-CM | POA: Diagnosis not present

## 2019-09-27 DIAGNOSIS — E669 Obesity, unspecified: Secondary | ICD-10-CM | POA: Diagnosis present

## 2019-09-27 DIAGNOSIS — G8929 Other chronic pain: Secondary | ICD-10-CM | POA: Diagnosis not present

## 2019-09-27 DIAGNOSIS — K219 Gastro-esophageal reflux disease without esophagitis: Secondary | ICD-10-CM | POA: Diagnosis present

## 2019-09-27 DIAGNOSIS — K5900 Constipation, unspecified: Secondary | ICD-10-CM | POA: Diagnosis not present

## 2019-09-27 DIAGNOSIS — I5023 Acute on chronic systolic (congestive) heart failure: Secondary | ICD-10-CM | POA: Diagnosis not present

## 2019-09-27 DIAGNOSIS — R4182 Altered mental status, unspecified: Secondary | ICD-10-CM | POA: Diagnosis not present

## 2019-09-27 DIAGNOSIS — R471 Dysarthria and anarthria: Secondary | ICD-10-CM | POA: Diagnosis not present

## 2019-09-27 DIAGNOSIS — I69391 Dysphagia following cerebral infarction: Secondary | ICD-10-CM | POA: Diagnosis not present

## 2019-09-27 DIAGNOSIS — E870 Hyperosmolality and hypernatremia: Secondary | ICD-10-CM | POA: Diagnosis not present

## 2019-09-27 DIAGNOSIS — I700 Atherosclerosis of aorta: Secondary | ICD-10-CM | POA: Diagnosis present

## 2019-09-27 DIAGNOSIS — I340 Nonrheumatic mitral (valve) insufficiency: Secondary | ICD-10-CM

## 2019-09-27 DIAGNOSIS — R140 Abdominal distension (gaseous): Secondary | ICD-10-CM | POA: Diagnosis present

## 2019-09-27 DIAGNOSIS — Z781 Physical restraint status: Secondary | ICD-10-CM

## 2019-09-27 DIAGNOSIS — M19012 Primary osteoarthritis, left shoulder: Secondary | ICD-10-CM | POA: Diagnosis not present

## 2019-09-27 DIAGNOSIS — H16291 Other keratoconjunctivitis, right eye: Secondary | ICD-10-CM | POA: Diagnosis present

## 2019-09-27 DIAGNOSIS — E876 Hypokalemia: Secondary | ICD-10-CM | POA: Diagnosis not present

## 2019-09-27 DIAGNOSIS — E43 Unspecified severe protein-calorie malnutrition: Secondary | ICD-10-CM | POA: Diagnosis present

## 2019-09-27 DIAGNOSIS — K760 Fatty (change of) liver, not elsewhere classified: Secondary | ICD-10-CM | POA: Diagnosis present

## 2019-09-27 DIAGNOSIS — Z79899 Other long term (current) drug therapy: Secondary | ICD-10-CM

## 2019-09-27 DIAGNOSIS — W19XXXA Unspecified fall, initial encounter: Secondary | ICD-10-CM | POA: Diagnosis not present

## 2019-09-27 DIAGNOSIS — I639 Cerebral infarction, unspecified: Secondary | ICD-10-CM | POA: Diagnosis not present

## 2019-09-27 DIAGNOSIS — I6340 Cerebral infarction due to embolism of unspecified cerebral artery: Secondary | ICD-10-CM | POA: Diagnosis present

## 2019-09-27 DIAGNOSIS — J302 Other seasonal allergic rhinitis: Secondary | ICD-10-CM | POA: Diagnosis present

## 2019-09-27 DIAGNOSIS — K279 Peptic ulcer, site unspecified, unspecified as acute or chronic, without hemorrhage or perforation: Secondary | ICD-10-CM

## 2019-09-27 DIAGNOSIS — N3000 Acute cystitis without hematuria: Secondary | ICD-10-CM | POA: Diagnosis not present

## 2019-09-27 DIAGNOSIS — H16209 Unspecified keratoconjunctivitis, unspecified eye: Secondary | ICD-10-CM | POA: Diagnosis not present

## 2019-09-27 DIAGNOSIS — K7460 Unspecified cirrhosis of liver: Secondary | ICD-10-CM | POA: Diagnosis present

## 2019-09-27 DIAGNOSIS — I97638 Postprocedural hematoma of a circulatory system organ or structure following other circulatory system procedure: Secondary | ICD-10-CM | POA: Diagnosis not present

## 2019-09-27 DIAGNOSIS — D62 Acute posthemorrhagic anemia: Secondary | ICD-10-CM | POA: Diagnosis not present

## 2019-09-27 DIAGNOSIS — S7011XA Contusion of right thigh, initial encounter: Secondary | ICD-10-CM | POA: Diagnosis present

## 2019-09-27 DIAGNOSIS — M79609 Pain in unspecified limb: Secondary | ICD-10-CM | POA: Diagnosis not present

## 2019-09-27 DIAGNOSIS — I6601 Occlusion and stenosis of right middle cerebral artery: Secondary | ICD-10-CM | POA: Diagnosis not present

## 2019-09-27 DIAGNOSIS — I252 Old myocardial infarction: Secondary | ICD-10-CM

## 2019-09-27 DIAGNOSIS — M25512 Pain in left shoulder: Secondary | ICD-10-CM | POA: Diagnosis not present

## 2019-09-27 DIAGNOSIS — L7632 Postprocedural hematoma of skin and subcutaneous tissue following other procedure: Secondary | ICD-10-CM | POA: Diagnosis present

## 2019-09-27 DIAGNOSIS — E1165 Type 2 diabetes mellitus with hyperglycemia: Secondary | ICD-10-CM | POA: Diagnosis not present

## 2019-09-27 DIAGNOSIS — R29810 Facial weakness: Secondary | ICD-10-CM | POA: Diagnosis not present

## 2019-09-27 DIAGNOSIS — R443 Hallucinations, unspecified: Secondary | ICD-10-CM | POA: Diagnosis not present

## 2019-09-27 DIAGNOSIS — N319 Neuromuscular dysfunction of bladder, unspecified: Secondary | ICD-10-CM | POA: Diagnosis not present

## 2019-09-27 HISTORY — DX: Atherosclerotic heart disease of native coronary artery without angina pectoris: I25.10

## 2019-09-27 HISTORY — PX: LEFT HEART CATH AND CORS/GRAFTS ANGIOGRAPHY: CATH118250

## 2019-09-27 HISTORY — DX: Unspecified systolic (congestive) heart failure: I50.20

## 2019-09-27 LAB — URINALYSIS, COMPLETE (UACMP) WITH MICROSCOPIC
Bacteria, UA: NONE SEEN
Glucose, UA: NEGATIVE mg/dL
Leukocytes,Ua: NEGATIVE
Nitrite: NEGATIVE
Protein, ur: >300 mg/dL — AB
Specific Gravity, Urine: 1.02 (ref 1.005–1.030)
pH: 5.5 (ref 5.0–8.0)

## 2019-09-27 LAB — CBC WITH DIFFERENTIAL/PLATELET
Abs Immature Granulocytes: 0.03 10*3/uL (ref 0.00–0.07)
Basophils Absolute: 0 10*3/uL (ref 0.0–0.1)
Basophils Relative: 1 %
Eosinophils Absolute: 0.1 10*3/uL (ref 0.0–0.5)
Eosinophils Relative: 2 %
HCT: 39.7 % (ref 36.0–46.0)
Hemoglobin: 13.3 g/dL (ref 12.0–15.0)
Immature Granulocytes: 1 %
Lymphocytes Relative: 27 %
Lymphs Abs: 1.4 10*3/uL (ref 0.7–4.0)
MCH: 31.7 pg (ref 26.0–34.0)
MCHC: 33.5 g/dL (ref 30.0–36.0)
MCV: 94.5 fL (ref 80.0–100.0)
Monocytes Absolute: 0.7 10*3/uL (ref 0.1–1.0)
Monocytes Relative: 13 %
Neutro Abs: 2.9 10*3/uL (ref 1.7–7.7)
Neutrophils Relative %: 56 %
Platelets: 214 10*3/uL (ref 150–400)
RBC: 4.2 MIL/uL (ref 3.87–5.11)
RDW: 14 % (ref 11.5–15.5)
WBC: 5.1 10*3/uL (ref 4.0–10.5)
nRBC: 0 % (ref 0.0–0.2)

## 2019-09-27 LAB — BASIC METABOLIC PANEL
Anion gap: 14 (ref 5–15)
BUN: 10 mg/dL (ref 8–23)
CO2: 16 mmol/L — ABNORMAL LOW (ref 22–32)
Calcium: 8.6 mg/dL — ABNORMAL LOW (ref 8.9–10.3)
Chloride: 113 mmol/L — ABNORMAL HIGH (ref 98–111)
Creatinine, Ser: 0.71 mg/dL (ref 0.44–1.00)
GFR calc Af Amer: >60 mL/min (ref >60)
GFR calc non Af Amer: >60 mL/min (ref >60)
Glucose, Bld: 112 mg/dL — ABNORMAL HIGH (ref 70–99)
Potassium: 3.5 mmol/L (ref 3.5–5.1)
Sodium: 143 mmol/L (ref 135–145)

## 2019-09-27 LAB — TROPONIN I (HIGH SENSITIVITY): Troponin I (High Sensitivity): 583 ng/L (ref <18)

## 2019-09-27 LAB — LIPID PANEL
Cholesterol: 97 mg/dL (ref 0–200)
HDL: 21 mg/dL — ABNORMAL LOW (ref >40)
LDL Cholesterol: 21 mg/dL (ref 0–99)
Total CHOL/HDL Ratio: 4.6 RATIO
Triglycerides: 273 mg/dL — ABNORMAL HIGH (ref <150)
VLDL: 55 mg/dL — ABNORMAL HIGH (ref 0–40)

## 2019-09-27 LAB — HEPARIN LEVEL (UNFRACTIONATED)
Heparin Unfractionated: 0.45 IU/mL (ref 0.30–0.70)
Heparin Unfractionated: 0.45 IU/mL (ref 0.30–0.70)

## 2019-09-27 LAB — ECHOCARDIOGRAM COMPLETE
Height: 62 in
Weight: 2611.2 oz

## 2019-09-27 LAB — SARS CORONAVIRUS 2 (TAT 6-24 HRS): SARS Coronavirus 2: NEGATIVE

## 2019-09-27 LAB — HIV ANTIBODY (ROUTINE TESTING W REFLEX): HIV Screen 4th Generation wRfx: NONREACTIVE

## 2019-09-27 SURGERY — LEFT HEART CATH AND CORS/GRAFTS ANGIOGRAPHY
Anesthesia: Moderate Sedation

## 2019-09-27 MED ORDER — ONDANSETRON HCL 4 MG/2ML IJ SOLN: 4.0000 mg | Freq: Four times a day (QID) | INTRAMUSCULAR | Status: DC | PRN

## 2019-09-27 MED ORDER — ASPIRIN EC 81 MG PO TBEC
81.0000 mg | DELAYED_RELEASE_TABLET | Freq: Every day | ORAL | Status: DC
  Administered 2019-09-28 – 2019-09-30 (×3): 81 mg via ORAL
  Filled 2019-09-27 (×3): qty 1

## 2019-09-27 MED ORDER — SODIUM CHLORIDE 0.9 % IV SOLN: INTRAVENOUS | Status: DC

## 2019-09-27 MED ORDER — HEPARIN (PORCINE) IN NACL 1000-0.9 UT/500ML-% IV SOLN
INTRAVENOUS | Status: DC | PRN
  Administered 2019-09-27: 500 mL

## 2019-09-27 MED ORDER — SODIUM CHLORIDE 0.9% FLUSH
3.0000 mL | Freq: Two times a day (BID) | INTRAVENOUS | Status: DC
  Administered 2019-09-27 – 2019-10-16 (×27): 3 mL via INTRAVENOUS

## 2019-09-27 MED ORDER — MIDAZOLAM HCL 2 MG/2ML IJ SOLN
INTRAMUSCULAR | Status: AC
  Filled 2019-09-27: qty 2

## 2019-09-27 MED ORDER — FUROSEMIDE 10 MG/ML IJ SOLN
80.0000 mg | Freq: Two times a day (BID) | INTRAMUSCULAR | Status: DC
  Administered 2019-09-27 – 2019-09-28 (×2): 80 mg via INTRAVENOUS
  Filled 2019-09-27 (×2): qty 8

## 2019-09-27 MED ORDER — SODIUM CHLORIDE 0.9% FLUSH: 3.0000 mL | INTRAVENOUS | Status: DC | PRN

## 2019-09-27 MED ORDER — FENTANYL CITRATE (PF) 100 MCG/2ML IJ SOLN
INTRAMUSCULAR | Status: DC | PRN
  Administered 2019-09-27: 25 ug via INTRAVENOUS

## 2019-09-27 MED ORDER — SACUBITRIL-VALSARTAN 24-26 MG PO TABS
1.0000 | ORAL_TABLET | Freq: Two times a day (BID) | ORAL | Status: DC
  Administered 2019-09-27 – 2019-09-30 (×6): 1 via ORAL
  Filled 2019-09-27 (×6): qty 1

## 2019-09-27 MED ORDER — SODIUM CHLORIDE 0.9 % IV SOLN: 250.0000 mL | INTRAVENOUS | Status: DC | PRN

## 2019-09-27 MED ORDER — ATORVASTATIN CALCIUM 80 MG PO TABS
80.0000 mg | ORAL_TABLET | Freq: Every day | ORAL | Status: DC
  Administered 2019-09-28 – 2019-09-29 (×2): 80 mg via ORAL
  Filled 2019-09-27 (×2): qty 1

## 2019-09-27 MED ORDER — MIDAZOLAM HCL 2 MG/2ML IJ SOLN
INTRAMUSCULAR | Status: DC | PRN
  Administered 2019-09-27: 1 mg via INTRAVENOUS

## 2019-09-27 MED ORDER — ACETAMINOPHEN 325 MG PO TABS: 650.0000 mg | ORAL_TABLET | ORAL | Status: DC | PRN

## 2019-09-27 MED ORDER — FENTANYL CITRATE (PF) 100 MCG/2ML IJ SOLN
INTRAMUSCULAR | Status: AC
  Filled 2019-09-27: qty 2

## 2019-09-27 MED ORDER — SODIUM CHLORIDE 0.9% FLUSH: 3.0000 mL | Freq: Two times a day (BID) | INTRAVENOUS | Status: DC

## 2019-09-27 MED ORDER — POTASSIUM CHLORIDE CRYS ER 20 MEQ PO TBCR
40.0000 meq | EXTENDED_RELEASE_TABLET | Freq: Every day | ORAL | Status: DC
  Administered 2019-09-27 – 2019-10-02 (×5): 40 meq via ORAL
  Filled 2019-09-27 (×5): qty 2

## 2019-09-27 MED ORDER — HEPARIN (PORCINE) IN NACL 1000-0.9 UT/500ML-% IV SOLN
INTRAVENOUS | Status: AC
  Filled 2019-09-27: qty 1000

## 2019-09-27 MED ORDER — IOHEXOL 300 MG/ML  SOLN
INTRAMUSCULAR | Status: DC | PRN
  Administered 2019-09-27: 110 mL

## 2019-09-27 MED ORDER — HEPARIN (PORCINE) 25000 UT/250ML-% IV SOLN
1100.0000 [IU]/h | INTRAVENOUS | Status: DC
  Administered 2019-09-27: 1000 [IU]/h via INTRAVENOUS
  Filled 2019-09-27: qty 250

## 2019-09-27 SURGICAL SUPPLY — 9 items
CATH INFINITI 5FR ANG PIGTAIL (CATHETERS) ×3 IMPLANT
CATH INFINITI 5FR JL4 (CATHETERS) ×3 IMPLANT
CATH INFINITI JR4 5F (CATHETERS) ×3 IMPLANT
DEVICE CLOSURE MYNXGRIP 5F (Vascular Products) ×3 IMPLANT
KIT MANI 3VAL PERCEP (MISCELLANEOUS) ×3 IMPLANT
NEEDLE PERC 18GX7CM (NEEDLE) ×3 IMPLANT
PACK CARDIAC CATH (CUSTOM PROCEDURE TRAY) ×3 IMPLANT
SHEATH AVANTI 5FR X 11CM (SHEATH) ×3 IMPLANT
WIRE GUIDERIGHT .035X150 (WIRE) ×3 IMPLANT

## 2019-09-27 NOTE — Progress Notes (Signed)
Received page from nurse that patient arrived from [facility], VSS, no acute distress, sitting in bed playing on phone, no complaints. Orders were written by [facility] APP Ward Givens who had signed patient out to me. Did discuss plan for heparin with Dr. Mariah Milling who recommended start IV heparin without bolus around 9pm given cath finished around 3pm. Order written and nurse updated. Per sign-out earlier from [employer], patient will be seen by Dr. Vickey Sages tonight for CT surgery eval. Ronie Spies PA-C

## 2019-09-27 NOTE — Progress Notes (Signed)
Pt going to [facility], 6E, room 5. Report called to Gwynn, at 1630

## 2019-09-27 NOTE — Progress Notes (Signed)
*  PRELIMINARY RESULTS* Echocardiogram 2D Echocardiogram has been performed.  Brittney Kennedy 09/27/2019, 10:05 AM

## 2019-09-27 NOTE — Progress Notes (Signed)
ANTICOAGULATION CONSULT NOTE - Initial Consult  Pharmacy Consult for Heparin  Indication: chest pain/ACS  Allergies  Allergen Reactions  . No [employer] Food Allergies Anaphylaxis and Hives    Raw onions    Patient Measurements: Height: 5\' 2"  (157.5 cm) Weight: 163 lb 3.2 oz (74 kg) IBW/kg (Calculated) : 50.1 Heparin Dosing Weight: 64.2 kg   Vital Signs: Temp: 97.9 F (36.6 C) (03/26 0314) Temp Source: Oral (03/26 0314) BP: 136/74 (03/26 0314) Pulse Rate: 89 (03/26 0314)  Labs: Recent Labs    09/26/19 1647 09/26/19 1848 09/26/19 2048 09/27/19 0428  HGB 15.3*  --   --  13.3  HCT 44.8  --   --  39.7  PLT 229  --   --  214  APTT  --   --  40*  --   LABPROT  --   --  13.9  --   INR  --   --  1.1  --   HEPARINUNFRC  --   --   --  0.45  CREATININE 0.77  --   --  0.71  TROPONINIHS  --  674* 567*  --     Estimated Creatinine Clearance: 64.3 mL/min (by C-G formula based on SCr of 0.71 mg/dL).   Medical History: History reviewed. No pertinent past medical history.  Medications:  Medications Prior to Admission  Medication Sig Dispense Refill Last Dose  . Ascorbic Acid (VITAMIN C) 1000 MG tablet Take 1,000 mg by mouth daily.   09/25/2019 at 1800  . cholecalciferol (VITAMIN D3) 25 MCG (1000 UNIT) tablet Take 1,000 Units by mouth daily.   09/25/2019 at 1800  . Echinacea 125 MG CAPS Take 2 capsules by mouth in the morning and at bedtime.   09/25/2019 at Unknown time  . loratadine (CLARITIN) 10 MG tablet Take 10 mg by mouth daily.   09/26/2019 at 1200  . Multiple Vitamin (MULTIVITAMIN) capsule Take 1 capsule by mouth daily.   09/25/2019 at 1800    Assessment: Pharmacy consulted to dose heparin in this 69 year old female admitted with ACS/NSTEMI.  CrCl = 61.7 ml/min    No prior anticoag noted.   Goal of Therapy:  Heparin level 0.3-0.7 units/ml Monitor platelets by anticoagulation protocol: Yes   Plan:  03/26 @ 0430 HL 0.45 therapeutic. Will continue current rate and will  recheck HL at 1200 and continue to monitor.  4/26, PharmD, BCPS Clinical Pharmacist 09/27/2019,5:47 AM

## 2019-09-27 NOTE — Progress Notes (Signed)
Provided report to Dewayne Hatch, RN that patient can sit up at 1645 and to restart heparin per Mariah Milling, MD at 2100, same rate, no bolus.

## 2019-09-27 NOTE — Discharge Summary (Addendum)
Physician Discharge Summary  Brittney Kennedy JQZ:[id number] DOB: 26-Mar-1951 DOA: 09/26/2019  PCP: Patient, No Pcp Per  Admit date: 09/26/2019 Discharge date: 09/27/2019  Admitted From: Home Disposition:  [facility] for CABG evaluation   Discharge Condition: Stable CODE STATUS: Full Diet recommendation: Heart Healthy  Brief/Interim Summary: 69 y.o.Caucasian femalewith no known medical problems, who presented to the emergency room with cancer of intermittent nausea and vomiting over the last week as well as epigastric pain that started about a couple weeks ago and has been intermittent as well. Symptoms have been significantly worse since yesterday. She had nausea and vomiting x3 yesterday with significant diminished p.o. intake today. She admits to dry cough without wheezing or dyspnea. Her pain has been epigastric and substernal area with no radiation. She denies any leg pain, recent travels or surgeries but does have lower leg and ankle edema. No fever or chills. She admits to constipation and denies any melena or bright red bleeding per rectum. No other bleeding diathesis.  3/26: Patient seen and examined.  Chest pain-free at time of my evaluation.  Cardiology evaluated the patient this morning.  Given elevated troponin concern for primary ischemic event plan for diagnostic and potentially therapeutic cardiac catheterization today.  Patient not endorsing any abdominal pain at time of my evaluation.  She states that tea and crackers had treated her abdominal pain and nausea however she was able to eat tacos without issue.  3/26 update: Received message from more cardiology service for further discharge.  Patient during her cath was noted to have severe multivessel coronary artery disease and systolic dysfunction with EF of 25%.  Patient will need transfer to [facility] for CABG evaluation.  Cardiology will called [facility] and set up transfer.  Accepting physician at [facility]  will be Dr. Jacques Navy.  Discharge Diagnoses:  Active Problems:   NSTEMI (non-ST elevated myocardial infarction) (HCC)   Non-ST elevation myocardial infarction Multivessel coronary artery disease Newly found systolic dysfunction, EF 25% Patient has been evaluated by cardiology Left heart catheterization 09/27/2019 at [facility] revealed severe multivessel coronary artery disease and reduced EF 25% Message received from cardiology NP Nicolasa Ducking Plan to transfer to [facility] for CABG evaluation Accepting physician at [facility] will be cardiology team Dr. Jacques Navy  Nausea and vomiting Abdominal pain Unclear etiology Patient has a history of "ulcers" presented to ED for further Suspect functional dyspepsia Per the patient her consumption of tea and crackers triggered abdominal pain and nausea however she was able to eat tacos without issue Plan: Continue as needed antiemetics We will have further discussion after cardiac catheterization however I anticipate patient can see gastroenterology as outpatient Will likely initiate PPI and antiindigestion regimen on discharge  Hypokalemia Monitor and replace as needed  Discharge Instructions  Discharge Instructions    Diet - low sodium heart healthy   Complete by: As directed    Increase activity slowly   Complete by: As directed      Allergies as of 09/27/2019      Reactions   No [employer] Food Allergies Anaphylaxis, Hives   Raw onions      Medication List    TAKE these medications   cholecalciferol 25 MCG (1000 UNIT) tablet Commonly known as: VITAMIN D3 Take 1,000 Units by mouth daily.   Echinacea 125 MG Caps Take 2 capsules by mouth in the morning and at bedtime.   loratadine 10 MG tablet Commonly known as: CLARITIN Take 10 mg by mouth daily.  multivitamin capsule Take 1 capsule by mouth daily.   vitamin C 1000 MG tablet Take 1,000 mg by mouth daily.       Allergies  Allergen Reactions   . No [employer] Food Allergies Anaphylaxis and Hives    Raw onions    Consultations:  Cardiology-[facility]   Procedures/Studies: DG Chest 2 View  Result Date: 09/26/2019 CLINICAL DATA:  Upper epigastric pain, nausea, vomiting for couple weeks, unable to keep much down EXAM: CHEST - 2 VIEW COMPARISON:  None FINDINGS: Borderline enlargement of cardiac silhouette. Mediastinal contours and pulmonary vascularity normal. Atherosclerotic calcification aorta. Bronchitic changes without pulmonary infiltrate. Minimal atelectasis and pleural effusion at LEFT base. No pneumothorax. Bones demineralized. IMPRESSION: Borderline enlargement of cardiac silhouette. Bronchitic changes with minimal atelectasis and small pleural effusion at LEFT base. Electronically Signed   By: Lavonia Dana M.D.   On: 09/26/2019 20:19   CT ABDOMEN PELVIS W CONTRAST  Result Date: 09/26/2019 CLINICAL DATA:  69 year old female with nausea vomiting. Concern for bowel obstruction. EXAM: CT ABDOMEN AND PELVIS WITH CONTRAST TECHNIQUE: Multidetector CT imaging of the abdomen and pelvis was performed using the standard protocol following bolus administration of intravenous contrast. CONTRAST:  142mL OMNIPAQUE IOHEXOL 300 MG/ML  SOLN COMPARISON:  None. FINDINGS: Lower chest: Partially visualized small bilateral pleural effusions with associated partial compressive atelectasis the lower lobes. Pneumonia is not excluded. Clinical correlation is recommended. There is mild cardiomegaly. No intra-abdominal free air or free fluid. Hepatobiliary: Fatty infiltration of the liver with findings of early cirrhosis. No intrahepatic biliary ductal dilatation. Cholecystectomy. No retained calcified stone noted the central CBD. Pancreas: Unremarkable. No pancreatic ductal dilatation or surrounding inflammatory changes. Spleen: Normal in size without focal abnormality. Adrenals/Urinary Tract: The adrenal glands are unremarkable. There is no hydronephrosis on either  side. There is symmetric enhancement and excretion of contrast by both kidneys. The visualized ureters and urinary bladder appear unremarkable. Stomach/Bowel: There is no bowel obstruction or active inflammation. The appendix is normal. Vascular/Lymphatic: Moderate aortoiliac atherosclerotic disease. IVC is unremarkable. No portal venous gas. Mildly enlarged lymph node in the porta hepatic measuring 14 mm. Reproductive: The uterus and ovaries are grossly unremarkable. Other: None Musculoskeletal: Osteopenia with degenerative changes of the spine. There is a transitional anatomy with grade 1 anterolisthesis. No acute osseous pathology. IMPRESSION: 1. No acute intra-abdominal or pelvic pathology. No bowel obstruction. Normal appendix. 2. Fatty liver with findings of early cirrhosis. 3. Partially visualized small bilateral pleural effusions and partial compressive atelectasis of the lower lobes. Pneumonia is not excluded. Clinical correlation is recommended. 4. Aortic Atherosclerosis (ICD10-I70.0). Electronically Signed   By: Anner Crete M.D.   On: 09/26/2019 19:21    (Echo, Carotid, EGD, Colonoscopy, ERCP)    Subjective: Patient seen and examined No chest pain prior to cath Cath revealed severe CAD and EF of 25% Patient will transfer to [facility] under the care of the cardiology service Accepting physician will be Dr. Margaretann Loveless   Discharge Exam: Vitals:   09/27/19 1455 09/27/19 1500  BP: (!) 155/91 (!) 145/83  Pulse: 94 90  Resp: 20 17  Temp:    SpO2: 93% 94%   Vitals:   09/27/19 1246 09/27/19 1358 09/27/19 1455 09/27/19 1500  BP: 135/68  (!) 155/91 (!) 145/83  Pulse: 84  94 90  Resp: 18  20 17   Temp: 98.1 F (36.7 C)     TempSrc: Oral     SpO2: 97% 97% 93% 94%  Weight: 74 kg     Height:  5\' 2"  (1.575 m)       General: Pt is alert, awake, not in acute distress Cardiovascular: RRR, S1/S2 +, no rubs, no gallops Respiratory: CTA bilaterally, no wheezing, no rhonchi Abdominal:  Soft, NT, ND, bowel sounds + Extremities: no edema, no cyanosis    The results of significant diagnostics from this hospitalization (including imaging, microbiology, ancillary and laboratory) are listed below for reference.     Microbiology: Recent Results (from the past 240 hour(s))  SARS CORONAVIRUS 2 (TAT 6-24 HRS) Nasopharyngeal Nasopharyngeal Swab     Status: None   Collection Time: 09/26/19  8:29 PM   Specimen: Nasopharyngeal Swab  Result Value Ref Range Status   SARS Coronavirus 2 NEGATIVE NEGATIVE Final    Comment: (NOTE) SARS-CoV-2 target nucleic acids are NOT DETECTED. The SARS-CoV-2 RNA is generally detectable in upper and lower respiratory specimens during the acute phase of infection. Negative results do not preclude SARS-CoV-2 infection, do not rule out co-infections with other pathogens, and should not be used as the sole basis for treatment or other patient management decisions. Negative results must be combined with clinical observations, patient history, and epidemiological information. The expected result is Negative. Fact Sheet for Patients: 09/28/19 Fact Sheet for Healthcare Providers: HairSlick.no This test is not yet approved or cleared by the [place] FDA and  has been authorized for detection and/or diagnosis of SARS-CoV-2 by FDA under an Emergency Use Authorization (EUA). This EUA will remain  in effect (meaning this test can be used) for the duration of the COVID-19 declaration under Section 56 4(b)(1) of the Act, 21 U.S.C. section 360bbb-3(b)(1), unless the authorization is terminated or revoked sooner. Performed at [facility], [address] [place], [place]      Labs: BNP (last 3 results) No results for input(s): BNP in the last 8760 hours. Basic Metabolic Panel: Recent Labs  Lab 09/26/19 1647 09/27/19 0428  NA 139 143  K 3.0* 3.5  CL 105 113*  CO2  18* 16*  GLUCOSE 128* 112*  BUN 10 10  CREATININE 0.77 0.71  CALCIUM 9.2 8.6*   Liver Function Tests: Recent Labs  Lab 09/26/19 1647  AST 68*  ALT 30  ALKPHOS 85  BILITOT 1.2  PROT 6.3*  ALBUMIN 3.2*   Recent Labs  Lab 09/26/19 1647  LIPASE 25   No results for input(s): AMMONIA in the last 168 hours. CBC: Recent Labs  Lab 09/26/19 1647 09/27/19 0428  WBC 4.1 5.1  NEUTROABS  --  2.9  HGB 15.3* 13.3  HCT 44.8 39.7  MCV 92.8 94.5  PLT 229 214   Cardiac Enzymes: No results for input(s): CKTOTAL, CKMB, CKMBINDEX, TROPONINI in the last 168 hours. BNP: Invalid input(s): POCBNP CBG: No results for input(s): GLUCAP in the last 168 hours. D-Dimer No results for input(s): DDIMER in the last 72 hours. Hgb A1c Recent Labs    09/26/19 2048  HGBA1C 5.9*   Lipid Profile Recent Labs    09/27/19 0428  CHOL 97  HDL 21*  LDLCALC 21  TRIG 09/29/19*  CHOLHDL 4.6   Thyroid function studies No results for input(s): TSH, T4TOTAL, T3FREE, THYROIDAB in the last 72 hours.  Invalid input(s): FREET3 Anemia work up No results for input(s): VITAMINB12, FOLATE, FERRITIN, TIBC, IRON, RETICCTPCT in the last 72 hours. Urinalysis    Component Value Date/Time   COLORURINE YELLOW 09/26/2019 1848   APPEARANCEUR CLEAR 09/26/2019 1848   LABSPEC 1.020 09/26/2019 1848   PHURINE 5.5 09/26/2019  1848   GLUCOSEU NEGATIVE 09/26/2019 1848   HGBUR TRACE (A) 09/26/2019 1848   BILIRUBINUR SMALL (A) 09/26/2019 1848   KETONESUR TRACE (A) 09/26/2019 1848   PROTEINUR >300 (A) 09/26/2019 1848   NITRITE NEGATIVE 09/26/2019 1848   LEUKOCYTESUR NEGATIVE 09/26/2019 1848   Sepsis Labs Invalid input(s): PROCALCITONIN,  WBC,  LACTICIDVEN Microbiology Recent Results (from the past 240 hour(s))  SARS CORONAVIRUS 2 (TAT 6-24 HRS) Nasopharyngeal Nasopharyngeal Swab     Status: None   Collection Time: 09/26/19  8:29 PM   Specimen: Nasopharyngeal Swab  Result Value Ref Range Status   SARS Coronavirus 2  NEGATIVE NEGATIVE Final    Comment: (NOTE) SARS-CoV-2 target nucleic acids are NOT DETECTED. The SARS-CoV-2 RNA is generally detectable in upper and lower respiratory specimens during the acute phase of infection. Negative results do not preclude SARS-CoV-2 infection, do not rule out co-infections with other pathogens, and should not be used as the sole basis for treatment or other patient management decisions. Negative results must be combined with clinical observations, patient history, and epidemiological information. The expected result is Negative. Fact Sheet for Patients: HairSlick.no Fact Sheet for Healthcare Providers: quierodirigir.com This test is not yet approved or cleared by the [place] FDA and  has been authorized for detection and/or diagnosis of SARS-CoV-2 by FDA under an Emergency Use Authorization (EUA). This EUA will remain  in effect (meaning this test can be used) for the duration of the COVID-19 declaration under Section 56 4(b)(1) of the Act, 21 U.S.C. section 360bbb-3(b)(1), unless the authorization is terminated or revoked sooner. Performed at [facility], [address], [place], [place] [address]      Time coordinating discharge: Over 30 minutes  SIGNED:   Tresa Moore, MD  [facility] 09/27/2019, 3:04 PM Pager   If 7PM-7AM, please contact night-coverage

## 2019-09-27 NOTE — Progress Notes (Signed)
Initial Nutrition Assessment  DOCUMENTATION CODES:   Not applicable  INTERVENTION:   RD will add supplements once diet advanced if needed  NUTRITION DIAGNOSIS:   Inadequate oral intake related to acute illness as evidenced by NPO status.  GOAL:   Patient will meet greater than or equal to 90% of their needs  MONITOR:   PO intake, Supplement acceptance, Labs, Weight trends, Skin, I & O's  REASON FOR ASSESSMENT:   Malnutrition Screening Tool    ASSESSMENT:   69 y.o. female history of PUD and cholecystectomy who is admitted with nausea, vomiting and epigastric pain and was found to have NSTEMI   Unable to see patient today as pt in procedure at time of RD visit. Pt NPO today. Suspect poor appetite and oral intake pta r/t abdominal pain, nausea and vomiting. RD will obtain nutrition related exam and history at follow up and order supplements if needed. There is no weight history in chart to determine if any significant weight changes.   Medications reviewed and include: aspirin, NaCl @75ml /hr, heparin   Labs reviewed:   NUTRITION - FOCUSED PHYSICAL EXAM: Unable to perform at this time   Diet Order:   Diet Order            Diet NPO time specified  Diet effective now             EDUCATION NEEDS:   Not appropriate for education at this time  Skin:  Skin Assessment: Reviewed RN Assessment  Last BM:  3/25  Height:   Ht Readings from Last 1 Encounters:  09/27/19 5\' 2"  (1.575 m)    Weight:   Wt Readings from Last 1 Encounters:  09/27/19 74 kg    Ideal Body Weight:  50 kg  BMI:  Body mass index is 29.84 kg/m.  Estimated Nutritional Needs:   Kcal:  1500-1700kcal/day  Protein:  75-85g/day  Fluid:  >1.5L/day  MS, RD, LDN Contact information available in Amion

## 2019-09-27 NOTE — Consult Note (Signed)
Cardiology Consult    Patient ID: Brittney Kennedy MRN: [id number], DOB/AGE: [birth year]   Admit date: 09/26/2019 Date of Consult: 09/27/2019  Primary Physician: Patient, No Pcp Per Primary Cardiologist: Lynden Ang Requesting Provider: S. Priscella Mann, MD  Patient Profile    Brittney Kennedy is a 69 y.o. female with a history of seasonal allergies, peptic ulcer dzs, and cholecystectomy, who is being seen today for the evaluation of elevated HsTroponin in the setting of n/v at the request of Dr. Priscella Mann.  Past Medical History   Past Medical History:  Diagnosis Date  . PUD (peptic ulcer disease)   . Seasonal allergies     Past Surgical History:  Procedure Laterality Date  . CHOLECYSTECTOMY       Allergies  Allergies  Allergen Reactions  . No Healthtouch Food Allergies Anaphylaxis and Hives    Raw onions    History of Present Illness    69 y/o ? w/ a h/o seasonal allergies, PUD, and cholecystectomy.  She has no prior cardiac history or FH of premature coronary dzs.  She is originally from [place], [place], but moved to [place] in 2015.  She hasn't seen a healthcare provider since.  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  She does not routinely exercise but is able to perform ADL's, housework, and grocery shopping w/o symptoms or limitations.    She was in her USOH until ~ 2 wks ago, when she began to experience nausea that was worsened with eating.  As a result, she cut out breakfast and lunch and has only been eating 1 meal/day (tacos for dinner). Though she has been experiencing intermittent vomiting (small volume - clear to yellow) over the course of the past 2 wks, she has not had n/v after eating her evening meal.  Over the past few days, in addition to persistent nausea and intermittent vomiting, she also developed constipation w/ abdominal bloating and discomfort.  She also noted mild lower ext edema.  On 3/24, she had worsening n, v, and abd discomfort.   She felt poorly all day and took a stool softener followed by BM late in the evening.  She says that she felt a little better after the BM but her roommate advised that she seek care given symptoms over the past 2 wks.  At no point did she note c/p or dyspnea.  She presented to the ED on the afternoon of 3/25.  Here, ECG showed ? Old septal infarct w/ lateral ST/T changes.  HsTrop was 674 followed by 567  583.  CT Abd/Pelvis neg for acute intra-abd or pelvic pathology.  Small bilat pl effusions w/ partial compressive atx of lower lobes noted.  She was placed on heparin and admitted.  Nausea improved following zofran yesterday afternoon.  Inpatient Medications    . aspirin EC  81 mg Oral Daily  . atorvastatin  80 mg Oral q1800  . metoprolol tartrate  12.5 mg Oral BID  . sodium chloride flush  3 mL Intravenous Q12H    Family History    Family History  Problem Relation Age of Onset  . Stroke Mother        died @ 110  . Multiple myeloma Father        died @ 8  . Multiple myeloma Sister    She indicated that her mother is deceased. She indicated that her father is deceased. She indicated that her sister is deceased.   Social  History    Social History   Socioeconomic History  . Marital status: Unknown    Spouse name: Not on file  . Number of children: Not on file  . Years of education: Not on file  . Highest education level: Not on file  Occupational History  . Not on file  Tobacco Use  . Smoking status: Never Smoker  . Smokeless tobacco: Never Used  Substance and Sexual Activity  . Alcohol use: Never  . Drug use: Never  . Sexual activity: Not on file  Other Topics Concern  . Not on file  Social History Narrative  . Not on file   Social Determinants of Health   Financial Resource Strain:   . Difficulty of Paying Living Expenses:   Food Insecurity:   . Worried About Charity fundraiser in the Last Year:   . Arboriculturist in the Last Year:   Transportation Needs:   .  Film/video editor (Medical):   Marland Kitchen Lack of Transportation (Non-Medical):   Physical Activity:   . Days of Exercise per Week:   . Minutes of Exercise per Session:   Stress:   . Feeling of Stress :   Social Connections:   . Frequency of Communication with Friends and Family:   . Frequency of Social Gatherings with Friends and Family:   . Attends Religious Services:   . Active Member of Clubs or Organizations:   . Attends Archivist Meetings:   Marland Kitchen Marital Status:   Intimate Partner Violence:   . Fear of Current or Ex-Partner:   . Emotionally Abused:   Marland Kitchen Physically Abused:   . Sexually Abused:      Review of Systems    General:  No chills, fever, night sweats or weight changes.  Cardiovascular:  No chest pain, dyspnea on exertion, edema, orthopnea, palpitations, paroxysmal nocturnal dyspnea. Dermatological: No rash, lesions/masses Respiratory: No cough, dyspnea Urologic: No hematuria, dysuria Abdominal:   +++ abdominal discomfort w/ bloating/constipation.  +++ 2 wk h/o nausea, vomiting.  No diarrhea, bright red blood per rectum, melena, or hematemesis Neurologic:  No visual changes, wkns, changes in mental status. All other systems reviewed and are otherwise negative except as noted above.  Physical Exam    Blood pressure 140/77, pulse 88, temperature 98.4 F (36.9 C), temperature source Oral, resp. rate 18, height 5' 2"  (1.575 m), weight 74 kg, SpO2 93 %.  General: Pleasant, NAD Psych: Normal affect. Neuro: Alert and oriented X 3. Moves all extremities spontaneously. HEENT: Normal  Neck: Supple without bruits or JVD. Lungs:  Resp regular and unlabored, diminished breath sounds @ bilat bases. Heart: RRR no s3, s4, or murmurs. Abdomen: Soft, non-tender, non-distended, BS + x 4.  Extremities: No clubbing, cyanosis.  1+ bilat LE edema. DP/PT/Radials 2+ and equal bilaterally.  Labs    Cardiac Enzymes Recent Labs  Lab 09/26/19 1848 09/26/19 2048 09/27/19 0444    TROPONINIHS 674* 567* 583*      Lab Results  Component Value Date   WBC 5.1 09/27/2019   HGB 13.3 09/27/2019   HCT 39.7 09/27/2019   MCV 94.5 09/27/2019   PLT 214 09/27/2019    Recent Labs  Lab 09/26/19 1647 09/26/19 1647 09/27/19 0428  NA 139   < > 143  K 3.0*   < > 3.5  CL 105   < > 113*  CO2 18*   < > 16*  BUN 10   < > 10  CREATININE 0.77   < >  0.71  CALCIUM 9.2   < > 8.6*  PROT 6.3*  --   --   BILITOT 1.2  --   --   ALKPHOS 85  --   --   ALT 30  --   --   AST 68*  --   --   GLUCOSE 128*   < > 112*   < > = values in this interval not displayed.   Lab Results  Component Value Date   CHOL 97 09/27/2019   HDL 21 (L) 09/27/2019   LDLCALC 21 09/27/2019   TRIG 273 (H) 09/27/2019    Radiology Studies    DG Chest 2 View  Result Date: 09/26/2019 CLINICAL DATA:  Upper epigastric pain, nausea, vomiting for couple weeks, unable to keep much down EXAM: CHEST - 2 VIEW COMPARISON:  None FINDINGS: Borderline enlargement of cardiac silhouette. Mediastinal contours and pulmonary vascularity normal. Atherosclerotic calcification aorta. Bronchitic changes without pulmonary infiltrate. Minimal atelectasis and pleural effusion at LEFT base. No pneumothorax. Bones demineralized. IMPRESSION: Borderline enlargement of cardiac silhouette. Bronchitic changes with minimal atelectasis and small pleural effusion at LEFT base. Electronically Signed   By: Lavonia Dana M.D.   On: 09/26/2019 20:19   CT ABDOMEN PELVIS W CONTRAST  Result Date: 09/26/2019 CLINICAL DATA:  68 year old female with nausea vomiting. Concern for bowel obstruction. EXAM: CT ABDOMEN AND PELVIS WITH CONTRAST TECHNIQUE: Multidetector CT imaging of the abdomen and pelvis was performed using the standard protocol following bolus administration of intravenous contrast. CONTRAST:  150m OMNIPAQUE IOHEXOL 300 MG/ML  SOLN COMPARISON:  None. FINDINGS: Lower chest: Partially visualized small bilateral pleural effusions with associated  partial compressive atelectasis the lower lobes. Pneumonia is not excluded. Clinical correlation is recommended. There is mild cardiomegaly. No intra-abdominal free air or free fluid. Hepatobiliary: Fatty infiltration of the liver with findings of early cirrhosis. No intrahepatic biliary ductal dilatation. Cholecystectomy. No retained calcified stone noted the central CBD. Pancreas: Unremarkable. No pancreatic ductal dilatation or surrounding inflammatory changes. Spleen: Normal in size without focal abnormality. Adrenals/Urinary Tract: The adrenal glands are unremarkable. There is no hydronephrosis on either side. There is symmetric enhancement and excretion of contrast by both kidneys. The visualized ureters and urinary bladder appear unremarkable. Stomach/Bowel: There is no bowel obstruction or active inflammation. The appendix is normal. Vascular/Lymphatic: Moderate aortoiliac atherosclerotic disease. IVC is unremarkable. No portal venous gas. Mildly enlarged lymph node in the porta hepatic measuring 14 mm. Reproductive: The uterus and ovaries are grossly unremarkable. Other: None Musculoskeletal: Osteopenia with degenerative changes of the spine. There is a transitional anatomy with grade 1 anterolisthesis. No acute osseous pathology. IMPRESSION: 1. No acute intra-abdominal or pelvic pathology. No bowel obstruction. Normal appendix. 2. Fatty liver with findings of early cirrhosis. 3. Partially visualized small bilateral pleural effusions and partial compressive atelectasis of the lower lobes. Pneumonia is not excluded. Clinical correlation is recommended. 4. Aortic Atherosclerosis (ICD10-I70.0). Electronically Signed   By: AAnner CreteM.D.   On: 09/26/2019 19:21    ECG & Cardiac Imaging    ST, 103, septal infarct, lat ST dep w/ T flattening - no old for comparison - personally reviewed.  Assessment & Plan    1.  NSTEMI:  Pt w/o prior cardiac hx, admitted 3/25 w/ a 2 wk h/o persistent nausea,  intermittent vomiting, abd bloating, constipation, and recent lower ext edema.  She has not had any chest pain or dyspnea.  On presentation, HsTrop elevated @ 674  567  583.  ECG w/ prior  septal infarct and lateral ST dep/T flattening.  Nausea improved this AM after zofran last night.  No events on tele.  We are concerned about NSTEMI w/ atypical presentation and abnl ECG.  Options for eval discussed in detail w/ Brittney Kennedy, and we've collectively agreed to pursue diagnostic catheterization today.  The patient understands that risks include but are not limited to stroke (1 in 1000), death (1 in 77), kidney failure [usually temporary] (1 in 500), bleeding (1 in 200), allergic reaction [possibly serious] (1 in 200), and agrees to proceed. .Cont asa,  blocker, statin, and heparin.  2.  Abd pain/N/V:  Cardiac eval as above.  If cath unremarkable, will need GI eval.  Imaging thus far has been unrevealing.  3.  Bilateral pl effusions/Lower ext edema:  Pt denies prior h/o of edema but has noted it in the past few days.  Also increase salt intake - eating tacos for dinner every night for the past 2 wks.  CTA chest w/ small bilateral pl effusions.  With the exception of edema, she does not appear to be grossly volume overloaded.  She has been receiving NS @ 132m/hr overnight.  Will reduce to 75/hr in preparation for cath.  Will assess LVEDP @ time of cath and determine need for diuresis.  Echo pending.  4. Elevated BP:  No h/o HTN.  Pressures 130's to 150's.  Follow on  blocker and titrate as tolerated.  Signed, CMurray Hodgkins NP 09/27/2019, 9:24 AM  For questions or updates, please contact   Please consult [email or web address] for contact info under Cardiology/STEMI.

## 2019-09-27 NOTE — Progress Notes (Signed)
ANTICOAGULATION CONSULT NOTE - Initial Consult  Pharmacy Consult for Heparin Indication: Severe 3V CAD  Allergies  Allergen Reactions  . No Healthtouch Food Allergies Anaphylaxis and Hives    Raw onions    Patient Measurements: Height: 5\' 2"  (157.5 cm) Weight: 164 lb 9.6 oz (74.7 kg) IBW/kg (Calculated) : 50.1 Heparin Dosing Weight: 66 kg  Vital Signs: Temp: 98 F (36.7 C) (03/26 1742) Temp Source: Oral (03/26 1742) BP: 145/69 (03/26 1848) Pulse Rate: 89 (03/26 1742)  Labs: Recent Labs    09/26/19 1647 09/26/19 1848 09/26/19 2048 09/27/19 0428 09/27/19 0444 09/27/19 1213  HGB 15.3*  --   --  13.3  --   --   HCT 44.8  --   --  39.7  --   --   PLT 229  --   --  214  --   --   APTT  --   --  40*  --   --   --   LABPROT  --   --  13.9  --   --   --   INR  --   --  1.1  --   --   --   HEPARINUNFRC  --   --   --  0.45  --  0.45  CREATININE 0.77  --   --  0.71  --   --   TROPONINIHS  --  674* 567*  --  583*  --     Estimated Creatinine Clearance: 64.5 mL/min (by C-G formula based on SCr of 0.71 mg/dL).   Medical History: Past Medical History:  Diagnosis Date  . CAD (coronary artery disease)    a. 09/2019 NSTEMI/Cath: LM nl, LAD 90p, 30m, diffuse mid-dist dzs, LCX 60m AV groove/OM2, RI 90p, RCA dominant, nl, RPDA 50m, EF 25%, glob HK.  96m HFrEF (heart failure with reduced ejection fraction) (HCC)    a. 09/2019 LV gram: EF 25%, global HK.  10/2019 PUD (peptic ulcer disease)   . Seasonal allergies      Assessment: CC/HPI: From [facility] after cath to [facility] for for CHF optimization and CT surgical eval.  PMH: seasonal allergies, peptic ulcer dzs, and cholecystectomy, CAD  Significant events: Over the last 2 wks has developed nausea after eating with intermittent vomiting. Now has developed constipation with abdominal bloating. LE edema.  Anticoag: s/p cath 3/26. Start IV heparin post-cath with severe MV CAD and EF 25%. CBC WNL.  Goal of Therapy:  Heparin level 0.3-0.7  units/ml Monitor platelets by anticoagulation protocol: Yes   Plan:  At 2100 start IV heparin (no bolus) at 1000 units/hr Check heparin level 6-8 hrs after heparin starts. Daily HL and CBC   Kaylor Simenson S. [id number], PharmD, BCPS Clinical Staff Pharmacist [email or web address] Merilynn Finland, Tanaja Ganger Stillinger 09/27/2019,8:15 PM

## 2019-09-27 NOTE — Progress Notes (Signed)
PROGRESS NOTE    Brittney Kennedy  UMP:[id number] DOB: Jan 26, 1951 DOA: 09/26/2019 PCP: Patient, No Pcp Per   Brief Narrative: 69 y.o. Caucasian female with no known medical problems, who presented to the emergency room with cancer of intermittent nausea and vomiting over the last week as well as epigastric pain that started about a couple weeks ago and has been intermittent as well.  Symptoms have been significantly worse since yesterday.  She had nausea and vomiting x3 yesterday with significant diminished p.o. intake today.  She admits to dry cough without wheezing or dyspnea.  Her pain has been epigastric and substernal area with no radiation.  She denies any leg pain, recent travels or surgeries but does have lower leg and ankle edema.   No fever or chills.  She admits to constipation and denies any melena or bright red bleeding per rectum.  No other bleeding diathesis.  3/26: Patient seen and examined.  Chest pain-free at time of my evaluation.  Cardiology evaluated the patient this morning.  Given elevated troponin concern for primary ischemic event plan for diagnostic and potentially therapeutic cardiac catheterization today.  Patient not endorsing any abdominal pain at time of my evaluation.  She states that tea and crackers had treated her abdominal pain and nausea however she was able to eat tacos without issue.   Assessment & Plan:   Active Problems:   NSTEMI (non-ST elevated myocardial infarction) ([place])  Non-ST elevation myocardial infarction Patient has been evaluated by cardiology Given potential ischemic changes on EKG as well as elevated troponin plan for cardiac catheterization today 3/26 Plan: N.p.o. for cardiac cath Continue beta-blocker Continue heparin drip Sublingual nitro as needed High-dose statin therapy Further recommendations to follow cardiac catheterization  Nausea and vomiting Abdominal pain Unclear etiology Patient has a history of "ulcers" presented to ED  for further Suspect functional dyspepsia Per the patient her consumption of tea and crackers triggered abdominal pain and nausea however she was able to eat tacos without issue Plan: Continue as needed antiemetics We will have further discussion after cardiac catheterization however I anticipate patient can see gastroenterology as outpatient Will likely initiate PPI and antiindigestion regimen on discharge  Hypokalemia Monitor and replace as needed    DVT prophylaxis: Heparin GTT Code Status: Full Family Communication: None today Disposition Plan: Anticipate return to previous home environment.  Plan for cardiac catheterization today 09/27/2019.  The patient tolerates the catheter remained stable she may be able to discharge home on 09/28/2019  Consultants:   Cardiology-[facility]  Procedures:   Left heart catheterization-09/27/2019  2D echocardiogram  Antimicrobials:   None   Subjective: Patient seen and examined No complaints feels well Chest pain-free at time of my evaluation this morning  Objective: Vitals:   09/26/19 2227 09/26/19 2336 09/27/19 0314 09/27/19 0832  BP: (!) 156/93 (!) 135/93 136/74 140/77  Pulse: (!) 111 92 89 88  Resp: 20   18  Temp: 97.9 F (36.6 C) 98.5 F (36.9 C) 97.9 F (36.6 C) 98.4 F (36.9 C)  TempSrc: Oral Oral Oral Oral  SpO2:  100% 98% 93%  Weight:  73.8 kg 74 kg   Height:  5\' 2"  (1.575 m)      Intake/Output Summary (Last 24 hours) at 09/27/2019 1207 Last data filed at 09/27/2019 0326 Gross per 24 hour  Intake 500 ml  Output 300 ml  Net 200 ml   Filed Weights   09/26/19 1643 09/26/19 2336 09/27/19 0314  Weight: 68 kg 73.8 kg 74 kg  Examination:  General exam: Appears calm and comfortable  Respiratory system: Clear to auscultation. Respiratory effort normal. Cardiovascular system: S1 & S2 heard, RRR. No JVD, murmurs, rubs, gallops or clicks. No pedal edema. Gastrointestinal system: Abdomen is nondistended, soft and  nontender. No organomegaly or masses felt. Normal bowel sounds heard. Central nervous system: Alert and oriented. No focal neurological deficits. Extremities: Symmetric 5 x 5 power. Skin: No rashes, lesions or ulcers Psychiatry: Judgement and insight appear normal. Mood & affect appropriate.     Data Reviewed: I have personally reviewed following labs and imaging studies  CBC: Recent Labs  Lab 09/26/19 1647 09/27/19 0428  WBC 4.1 5.1  NEUTROABS  --  2.9  HGB 15.3* 13.3  HCT 44.8 39.7  MCV 92.8 94.5  PLT 229 214   Basic Metabolic Panel: Recent Labs  Lab 09/26/19 1647 09/27/19 0428  NA 139 143  K 3.0* 3.5  CL 105 113*  CO2 18* 16*  GLUCOSE 128* 112*  BUN 10 10  CREATININE 0.77 0.71  CALCIUM 9.2 8.6*   GFR: Estimated Creatinine Clearance: 64.3 mL/min (by C-G formula based on SCr of 0.71 mg/dL). Liver Function Tests: Recent Labs  Lab 09/26/19 1647  AST 68*  ALT 30  ALKPHOS 85  BILITOT 1.2  PROT 6.3*  ALBUMIN 3.2*   Recent Labs  Lab 09/26/19 1647  LIPASE 25   No results for input(s): AMMONIA in the last 168 hours. Coagulation Profile: Recent Labs  Lab 09/26/19 2048  INR 1.1   Cardiac Enzymes: No results for input(s): CKTOTAL, CKMB, CKMBINDEX, TROPONINI in the last 168 hours. BNP (last 3 results) No results for input(s): PROBNP in the last 8760 hours. HbA1C: Recent Labs    09/26/19 2048  HGBA1C 5.9*   CBG: No results for input(s): GLUCAP in the last 168 hours. Lipid Profile: Recent Labs    09/27/19 0428  CHOL 97  HDL 21*  LDLCALC 21  TRIG 353*  CHOLHDL 4.6   Thyroid Function Tests: No results for input(s): TSH, T4TOTAL, FREET4, T3FREE, THYROIDAB in the last 72 hours. Anemia Panel: No results for input(s): VITAMINB12, FOLATE, FERRITIN, TIBC, IRON, RETICCTPCT in the last 72 hours. Sepsis Labs: No results for input(s): PROCALCITON, LATICACIDVEN in the last 168 hours.  Recent Results (from the past 240 hour(s))  SARS CORONAVIRUS 2 (TAT  6-24 HRS) Nasopharyngeal Nasopharyngeal Swab     Status: None   Collection Time: 09/26/19  8:29 PM   Specimen: Nasopharyngeal Swab  Result Value Ref Range Status   SARS Coronavirus 2 NEGATIVE NEGATIVE Final    Comment: (NOTE) SARS-CoV-2 target nucleic acids are NOT DETECTED. The SARS-CoV-2 RNA is generally detectable in upper and lower respiratory specimens during the acute phase of infection. Negative results do not preclude SARS-CoV-2 infection, do not rule out co-infections with other pathogens, and should not be used as the sole basis for treatment or other patient management decisions. Negative results must be combined with clinical observations, patient history, and epidemiological information. The expected result is Negative. Fact Sheet for Patients: HairSlick.no Fact Sheet for Healthcare Providers: quierodirigir.com This test is not yet approved or cleared by the [place] FDA and  has been authorized for detection and/or diagnosis of SARS-CoV-2 by FDA under an Emergency Use Authorization (EUA). This EUA will remain  in effect (meaning this test can be used) for the duration of the COVID-19 declaration under Section 56 4(b)(1) of the Act, 21 U.S.C. section 360bbb-3(b)(1), unless the authorization is terminated or revoked sooner. Performed  at [facility], [address] [place], [place] [address]          Radiology Studies: DG Chest 2 View  Result Date: 09/26/2019 CLINICAL DATA:  Upper epigastric pain, nausea, vomiting for couple weeks, unable to keep much down EXAM: CHEST - 2 VIEW COMPARISON:  None FINDINGS: Borderline enlargement of cardiac silhouette. Mediastinal contours and pulmonary vascularity normal. Atherosclerotic calcification aorta. Bronchitic changes without pulmonary infiltrate. Minimal atelectasis and pleural effusion at LEFT base. No pneumothorax. Bones demineralized. IMPRESSION: Borderline  enlargement of cardiac silhouette. Bronchitic changes with minimal atelectasis and small pleural effusion at LEFT base. Electronically Signed   By: Ulyses Southward M.D.   On: 09/26/2019 20:19   CT ABDOMEN PELVIS W CONTRAST  Result Date: 09/26/2019 CLINICAL DATA:  69 year old female with nausea vomiting. Concern for bowel obstruction. EXAM: CT ABDOMEN AND PELVIS WITH CONTRAST TECHNIQUE: Multidetector CT imaging of the abdomen and pelvis was performed using the standard protocol following bolus administration of intravenous contrast. CONTRAST:  OMNIPAQUE IOHEXOL 300 MG/ML  SOLN COMPARISON:  None. FINDINGS: Lower chest: Partially visualized small bilateral pleural effusions with associated partial compressive atelectasis the lower lobes. Pneumonia is not excluded. Clinical correlation is recommended. There is mild cardiomegaly. No intra-abdominal free air or free fluid. Hepatobiliary: Fatty infiltration of the liver with findings of early cirrhosis. No intrahepatic biliary ductal dilatation. Cholecystectomy. No retained calcified stone noted the central CBD. Pancreas: Unremarkable. No pancreatic ductal dilatation or surrounding inflammatory changes. Spleen: Normal in size without focal abnormality. Adrenals/Urinary Tract: The adrenal glands are unremarkable. There is no hydronephrosis on either side. There is symmetric enhancement and excretion of contrast by both kidneys. The visualized ureters and urinary bladder appear unremarkable. Stomach/Bowel: There is no bowel obstruction or active inflammation. The appendix is normal. Vascular/Lymphatic: Moderate aortoiliac atherosclerotic disease. IVC is unremarkable. No portal venous gas. Mildly enlarged lymph node in the porta hepatic measuring 14 mm. Reproductive: The uterus and ovaries are grossly unremarkable. Other: None Musculoskeletal: Osteopenia with degenerative changes of the spine. There is a transitional anatomy with grade 1 anterolisthesis. No acute  osseous pathology. IMPRESSION: 1. No acute intra-abdominal or pelvic pathology. No bowel obstruction. Normal appendix. 2. Fatty liver with findings of early cirrhosis. 3. Partially visualized small bilateral pleural effusions and partial compressive atelectasis of the lower lobes. Pneumonia is not excluded. Clinical correlation is recommended. 4. Aortic Atherosclerosis (ICD10-I70.0). Electronically Signed   By: Elgie Collard M.D.   On: 09/26/2019 19:21        Scheduled Meds: . aspirin EC  81 mg Oral Daily  . atorvastatin  80 mg Oral q1800  . metoprolol tartrate  12.5 mg Oral BID  . sodium chloride flush  3 mL Intravenous Q12H   Continuous Infusions: . sodium chloride 100 mL/hr at 09/26/19 2359  . sodium chloride    . sodium chloride    . heparin 1,000 Units/hr (09/26/19 2121)     LOS: 1 day    Time spent: 35 minutes    Tresa Moore, MD [facility] Pager [phone]  If 7PM-7AM, please contact night-coverage 09/27/2019, 12:07 PM

## 2019-09-27 NOTE — Progress Notes (Signed)
Cardiac catheterization completed No complications Left ventricular end-diastolic pressure 35 Severely depressed ejection fraction estimated 25%, global hypokinesis  Images confirming severe three-vessel native coronary artery disease Severe proximal LAD, distal LAD disease Severe mid PDA disease Severe ramus/OM1 disease Severe mid OM 2/AV groove circumflex disease  Recommend further discussion with interventional cardiology/CT surgery for best options Also needs CHF management given EDP 35, severely depressed ejection fraction  Case has been discussed with on-call team in [facility] They have accepted, transfer orders placed We will look to restart heparin infusion 9 PM with no bolus --We will need gentle diuresis, Initiation of heart failure medications  Signed, Dossie Arbour, MD, Ph.D [facility]

## 2019-09-27 NOTE — H&P (Signed)
History & Physical    Patient ID: Brittney Kennedy MRN: [id number], DOB/AGE: 68-08-1950   Admit date: (Not on file)   Primary Physician: Patient, No Pcp Per Primary Cardiologist: Ida Rogue, MD  Patient Profile    Brittney Kennedy is a 69 y.o. female with a history of seasonal allergies, peptic ulcer dzs, and cholecystectomy, who is being seen today for the evaluation of elevated HsTroponin in the setting of n/v at the request of Dr. Priscella Mann.  Past Medical History    Past Medical History:  Diagnosis Date  . CAD (coronary artery disease)    a. 09/2019 NSTEMI/Cath: LM nl, LAD 90p, 77m diffuse mid-dist dzs, LCX 967mV groove/OM2, RI 90p, RCA dominant, nl, RPDA 9064mF 25%, glob HK.  . HMarland KitchenrEF (heart failure with reduced ejection fraction) [place]  a. 09/2019 LV gram: EF 25%, global HK.  . PMarland KitchenD (peptic ulcer disease)   . Seasonal allergies     Past Surgical History:  Procedure Laterality Date  . CHOLECYSTECTOMY       Allergies  Allergies  Allergen Reactions  . No Healtht[employer] Food Allergies Anaphylaxis and Hives    Raw onions    History of Present Illness    68 4o ? w/ a h/o seasonal allergies, PUD, and cholecystectomy.  She has no prior cardiac history or FH of premature coronary dzs.  [place].  She hasn't seen a healthcare provider since.  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  She does not routinely exercise but is able to perform ADL's, housework, and grocery shopping w/o symptoms or limitations.    She was in her USOH until ~ 2 wks ago, when she began to experience nausea that was worsened with eating.  As a result, she cut out breakfast and lunch and has only been eating 1 meal/day (tacos for dinner). Though she has been experiencing intermittent vomiting (small volume - clear to yellow) over the course of the past 2 wks, she has not had n/v after eating her evening meal.  Over the past  few days, in addition to persistent nausea and intermittent vomiting, she also developed constipation w/ abdominal bloating and discomfort.  She also noted mild lower ext edema.  On 3/24, she had worsening n, v, and abd discomfort.  She felt poorly all day and took a stool softener followed by BM late in the evening.  She says that she felt a little better after the BM but her roommate advised that she seek care given symptoms over the past 2 wks.  At no point did she note c/p or dyspnea.  She presented to the ED on the afternoon of 3/25.  Here, ECG showed ? Old septal infarct w/ lateral ST/T changes.  HsTrop was 674 followed by 567  583.  CT Abd/Pelvis neg for acute intra-abd or pelvic pathology.  Small bilat pl effusions w/ partial compressive atx of lower lobes noted.  She was placed on heparin and admitted.  Nausea improved following zofran yesterday afternoon.  Cath this afternoon revealed severe multivessel CAD and LV dysfxn (EF 25%)as outlined in the PMH.  As a result[facility].  Home Medications     Prior to Admission medications   Medication Sig Start Date End Date Taking? Authorizing Provider  Ascorbic Acid (VITAMIN C) 1000 MG tablet Take 1,000  mg by mouth daily.    [provider]  cholecalciferol (VITAMIN D3) 25 MCG (1000 UNIT) tablet Take 1,000 Units by mouth daily.    [provider]  Echinacea 125 MG CAPS Take 2 capsules by mouth in the morning and at bedtime.    [provider]  loratadine (CLARITIN) 10 MG tablet Take 10 mg by mouth daily.    [provider]  Multiple Vitamin (MULTIVITAMIN) capsule Take 1 capsule by mouth daily.    [provider]    Family History    Family History  Problem Relation Age of Onset  . Stroke Mother        died @ 72  . Multiple myeloma Father        died @ 81  . Multiple myeloma Sister    She indicated that her mother is deceased. She indicated  that her father is deceased. She indicated that her sister is deceased.   Social History    Social History   Socioeconomic History  . Marital status: Unknown    Spouse name: Not on file  . Number of children: Not on file  . Years of education: Not on file  . Highest education level: Not on file  Occupational History  . Not on file  Tobacco Use  . Smoking status: Never Smoker  . Smokeless tobacco: Never Used  Substance and Sexual Activity  . Alcohol use: Never  . Drug use: Never  . Sexual activity: Not on file  Other Topics Concern  . Not on file  Social History Narrative  . Not on file   Social Determinants of Health   Financial Resource Strain:   . Difficulty of Paying Living Expenses:   Food Insecurity:   . Worried About Charity fundraiser in the Last Year:   . Arboriculturist in the Last Year:   Transportation Needs:   . Film/video editor (Medical):   Marland Kitchen Lack of Transportation (Non-Medical):   Physical Activity:   . Days of Exercise per Week:   . Minutes of Exercise per Session:   Stress:   . Feeling of Stress :   Social Connections:   . Frequency of Communication with Friends and Family:   . Frequency of Social Gatherings with Friends and Family:   . Attends Religious Services:   . Active Member of Clubs or Organizations:   . Attends Archivist Meetings:   Marland Kitchen Marital Status:   Intimate Partner Violence:   . Fear of Current or Ex-Partner:   . Emotionally Abused:   Marland Kitchen Physically Abused:   . Sexually Abused:      Review of Systems    General:  No chills, fever, night sweats or weight changes.  Cardiovascular:  No chest pain, dyspnea on exertion, edema, orthopnea, palpitations, paroxysmal nocturnal dyspnea. Dermatological: No rash, lesions/masses Respiratory: No cough, dyspnea Urologic: No hematuria, dysuria Abdominal:   +++ abdominal discomfort w/ bloating/constipation.  +++ 2 wk h/o nausea, vomiting.  No diarrhea, bright red blood per  rectum, melena, or hematemesis Neurologic:  No visual changes, wkns, changes in mental status. All other systems reviewed and are otherwise negative except as noted above.  Physical Exam    Blood pressure 140/77, pulse 88, temperature 98.4 F (36.9 C), temperature source Oral, resp. rate 18, height 5' 2"  (1.575 m), weight 74 kg, SpO2 93 %.  General: Pleasant, NAD Psych: Normal affect. Neuro: Alert and oriented X 3. Moves all extremities spontaneously. HEENT:  Normal   Neck: Supple without bruits or JVD. Lungs:  Resp regular and unlabored, diminished breath sounds @ bilat bases. Heart: RRR no s3, s4, or murmurs. Abdomen: Soft, non-tender, non-distended, BS + x 4.  Extremities: No clubbing, cyanosis.  1+ bilat LE edema. DP/PT/Radials 2+ and equal bilaterally.  Labs    Cardiac Enzymes Recent Labs  Lab 09/26/19 1848 09/26/19 2048 09/27/19 0444  TROPONINIHS 674* 567* 583*      Lab Results  Component Value Date   WBC 5.1 09/27/2019   HGB 13.3 09/27/2019   HCT 39.7 09/27/2019   MCV 94.5 09/27/2019   PLT 214 09/27/2019    Recent Labs  Lab 09/26/19 1647 09/26/19 1647 09/27/19 0428  NA 139   < > 143  K 3.0*   < > 3.5  CL 105   < > 113*  CO2 18*   < > 16*  BUN 10   < > 10  CREATININE 0.77   < > 0.71  CALCIUM 9.2   < > 8.6*  PROT 6.3*  --   --   BILITOT 1.2  --   --   ALKPHOS 85  --   --   ALT 30  --   --   AST 68*  --   --   GLUCOSE 128*   < > 112*   < > = values in this interval not displayed.   Lab Results  Component Value Date   CHOL 97 09/27/2019   HDL 21 (L) 09/27/2019   LDLCALC 21 09/27/2019   TRIG 273 (H) 09/27/2019     Radiology Studies    DG Chest 2 View  Result Date: 09/26/2019 CLINICAL DATA:  Upper epigastric pain, nausea, vomiting for couple weeks, unable to keep much down EXAM: CHEST - 2 VIEW COMPARISON:  None FINDINGS: Borderline enlargement of cardiac silhouette. Mediastinal contours and pulmonary vascularity normal. Atherosclerotic  calcification aorta. Bronchitic changes without pulmonary infiltrate. Minimal atelectasis and pleural effusion at LEFT base. No pneumothorax. Bones demineralized. IMPRESSION: Borderline enlargement of cardiac silhouette. Bronchitic changes with minimal atelectasis and small pleural effusion at LEFT base. Electronically Signed   By: Lavonia Dana M.D.   On: 09/26/2019 20:19   CT ABDOMEN PELVIS W CONTRAST  Result Date: 09/26/2019 CLINICAL DATA:  69 year old female with nausea vomiting. Concern for bowel obstruction. EXAM: CT ABDOMEN AND PELVIS WITH CONTRAST TECHNIQUE: Multidetector CT imaging of the abdomen and pelvis was performed using the standard protocol following bolus administration of intravenous contrast. CONTRAST:  19m OMNIPAQUE IOHEXOL 300 MG/ML  SOLN COMPARISON:  None. FINDINGS: Lower chest: Partially visualized small bilateral pleural effusions with associated partial compressive atelectasis the lower lobes. Pneumonia is not excluded. Clinical correlation is recommended. There is mild cardiomegaly. No intra-abdominal free air or free fluid. Hepatobiliary: Fatty infiltration of the liver with findings of early cirrhosis. No intrahepatic biliary ductal dilatation. Cholecystectomy. No retained calcified stone noted the central CBD. Pancreas: Unremarkable. No pancreatic ductal dilatation or surrounding inflammatory changes. Spleen: Normal in size without focal abnormality. Adrenals/Urinary Tract: The adrenal glands are unremarkable. There is no hydronephrosis on either side. There is symmetric enhancement and excretion of contrast by both kidneys. The visualized ureters and urinary bladder appear unremarkable. Stomach/Bowel: There is no bowel obstruction or active inflammation. The appendix is normal. Vascular/Lymphatic: Moderate aortoiliac atherosclerotic disease. IVC is unremarkable. No portal venous gas. Mildly enlarged lymph node in the porta hepatic measuring 14 mm. Reproductive: The uterus and  ovaries are grossly unremarkable. Other: None Musculoskeletal:  Osteopenia with degenerative changes of the spine. There is a transitional anatomy with grade 1 anterolisthesis. No acute osseous pathology. IMPRESSION: 1. No acute intra-abdominal or pelvic pathology. No bowel obstruction. Normal appendix. 2. Fatty liver with findings of early cirrhosis. 3. Partially visualized small bilateral pleural effusions and partial compressive atelectasis of the lower lobes. Pneumonia is not excluded. Clinical correlation is recommended. 4. Aortic Atherosclerosis (ICD10-I70.0). Electronically Signed   By: Anner Crete M.D.   On: 09/26/2019 19:21   CARDIAC CATHETERIZATION  Result Date: 09/27/2019  The left ventricular ejection fraction is less than 25% by visual estimate.  LV end diastolic pressure is severely elevated.  There is severe left ventricular systolic dysfunction.  RPDA lesion is 90% stenosed.  Ramus lesion is 90% stenosed.  3rd Mrg lesion is 90% stenosed.  Dist LAD-1 lesion is 80% stenosed.  Dist LAD-2 lesion is 50% stenosed.  Prox LAD lesion is 90% stenosed.     ECG & Cardiac Imaging    ST, 103, septal infarct, lat ST dep w/ T flattening - no old for comparison - personally reviewed.  Assessment & Plan    1.  NSTEMI:  Pt w/o prior cardiac hx, admitted 3/25 w/ a 2 wk h/o persistent nausea, intermittent vomiting, abd bloating, constipation, and recent lower ext edema.  She has not had any chest pain or dyspnea.  On presentation, HsTrop elevated @ 674  567  583.  ECG w/ prior septal infarct and lateral ST dep/T flattening.  Nausea improved this AM after zofran last night.  No events on tele.  We are concerned about NSTEMI w/ atypical presentation and abnl ECG.  Cath today revealed severe multivessel CAD.  Tx to [facility] for CHF optimization and CT surgical eval.  Cont asa, statin.  Will hold off on  blocker in the setting of low output CHF requiring diuresis (LVEDP 38-41).  2.  Abd pain/N/V:   Cath w/ EF 25%.  Suspect GI symptoms largely 2/2 CHF/volume excess.  Follow w/ diuresis.  Cont prn zofran.  3.  Acute systolic CHF:  Pt denies prior h/o of edema but has noted it in the past few days.  Also increase salt intake - eating tacos for dinner every night for the past 2 wks.  CTA chest w/ small bilateral pl effusions.  LVEDP markedly elevated on cath (38-94mHg).  Will add lasix 80 IV BID.  Hold  blocker for now.  Will add entresto.  If renal fxn stable can look to add spiro as well.  CT surgical eval as above.  4. Elevated BP:  No h/o HTN.  Pressures 130's to 150's.  Hold  blocker.  Follow w/ diuresis/entresto.  Signed, CMurray Hodgkins NP 09/27/2019, 3:31 PM

## 2019-09-28 ENCOUNTER — Inpatient Hospital Stay (HOSPITAL_COMMUNITY): Payer: Medicare Other

## 2019-09-28 DIAGNOSIS — M79609 Pain in unspecified limb: Secondary | ICD-10-CM

## 2019-09-28 DIAGNOSIS — I5021 Acute systolic (congestive) heart failure: Secondary | ICD-10-CM

## 2019-09-28 DIAGNOSIS — Z01810 Encounter for preprocedural cardiovascular examination: Secondary | ICD-10-CM

## 2019-09-28 DIAGNOSIS — I428 Other cardiomyopathies: Secondary | ICD-10-CM

## 2019-09-28 LAB — BASIC METABOLIC PANEL
Anion gap: 20 — ABNORMAL HIGH (ref 5–15)
BUN: 10 mg/dL (ref 8–23)
CO2: 16 mmol/L — ABNORMAL LOW (ref 22–32)
Calcium: 8.9 mg/dL (ref 8.9–10.3)
Chloride: 106 mmol/L (ref 98–111)
Creatinine, Ser: 0.83 mg/dL (ref 0.44–1.00)
GFR calc Af Amer: >60 mL/min (ref >60)
GFR calc non Af Amer: >60 mL/min (ref >60)
Glucose, Bld: 107 mg/dL — ABNORMAL HIGH (ref 70–99)
Potassium: 3.2 mmol/L — ABNORMAL LOW (ref 3.5–5.1)
Sodium: 142 mmol/L (ref 135–145)

## 2019-09-28 LAB — CBC WITH DIFFERENTIAL/PLATELET
Abs Immature Granulocytes: 0.05 10*3/uL (ref 0.00–0.07)
Basophils Absolute: 0.1 10*3/uL (ref 0.0–0.1)
Basophils Relative: 1 %
Eosinophils Absolute: 0.1 10*3/uL (ref 0.0–0.5)
Eosinophils Relative: 1 %
HCT: 43.5 % (ref 36.0–46.0)
Hemoglobin: 14.5 g/dL (ref 12.0–15.0)
Immature Granulocytes: 1 %
Lymphocytes Relative: 23 %
Lymphs Abs: 2.3 10*3/uL (ref 0.7–4.0)
MCH: 31.4 pg (ref 26.0–34.0)
MCHC: 33.3 g/dL (ref 30.0–36.0)
MCV: 94.2 fL (ref 80.0–100.0)
Monocytes Absolute: 1.2 10*3/uL — ABNORMAL HIGH (ref 0.1–1.0)
Monocytes Relative: 11 %
Neutro Abs: 6.6 10*3/uL (ref 1.7–7.7)
Neutrophils Relative %: 63 %
Platelets: 240 10*3/uL (ref 150–400)
RBC: 4.62 MIL/uL (ref 3.87–5.11)
RDW: 13.8 % (ref 11.5–15.5)
WBC: 10.3 10*3/uL (ref 4.0–10.5)
nRBC: 0 % (ref 0.0–0.2)

## 2019-09-28 LAB — MAGNESIUM: Magnesium: 1.4 mg/dL — ABNORMAL LOW (ref 1.7–2.4)

## 2019-09-28 LAB — TSH: TSH: 0.048 u[IU]/mL — ABNORMAL LOW (ref 0.350–4.500)

## 2019-09-28 LAB — HEPARIN LEVEL (UNFRACTIONATED)
Heparin Unfractionated: 0.1 IU/mL — ABNORMAL LOW (ref 0.30–0.70)
Heparin Unfractionated: 0.23 IU/mL — ABNORMAL LOW (ref 0.30–0.70)

## 2019-09-28 MED ORDER — CARVEDILOL 3.125 MG PO TABS
3.1250 mg | ORAL_TABLET | Freq: Two times a day (BID) | ORAL | Status: DC
  Administered 2019-09-28 – 2019-09-30 (×3): 3.125 mg via ORAL
  Filled 2019-09-28 (×3): qty 1

## 2019-09-28 MED ORDER — OXYCODONE HCL 5 MG PO TABS
5.0000 mg | ORAL_TABLET | Freq: Four times a day (QID) | ORAL | Status: DC | PRN
  Administered 2019-09-28 (×2): 5 mg via ORAL
  Filled 2019-09-28 (×2): qty 1

## 2019-09-28 MED ORDER — MAGNESIUM SULFATE 4 GM/100ML IV SOLN
4.0000 g | Freq: Once | INTRAVENOUS | Status: AC
  Administered 2019-09-28: 4 g via INTRAVENOUS
  Filled 2019-09-28: qty 100

## 2019-09-28 MED ORDER — SPIRONOLACTONE 12.5 MG HALF TABLET
12.5000 mg | ORAL_TABLET | Freq: Every day | ORAL | Status: DC
  Administered 2019-09-28 – 2019-09-30 (×3): 12.5 mg via ORAL
  Filled 2019-09-28 (×3): qty 1

## 2019-09-28 MED ORDER — DIGOXIN 125 MCG PO TABS
0.1250 mg | ORAL_TABLET | Freq: Every day | ORAL | Status: DC
  Administered 2019-09-28 – 2019-09-30 (×3): 0.125 mg via ORAL
  Filled 2019-09-28 (×3): qty 1

## 2019-09-28 NOTE — Progress Notes (Signed)
   09/28/19 0209  What Happened  Was fall witnessed? No  Was patient injured? Unsure  Patient found on floor  Found by Staff-comment [facility], RN-was sitting outside room & heard fall)  Stated prior activity other (comment) (Bed to [facility])  Follow Up  MD notified yes  Time MD notified 0207  Family notified No - patient refusal (Pt stated she would tell son)  Additional tests No  Progress note created (see row info) Yes  Adult Fall Risk Assessment  Risk Factor Category (scoring not indicated) Not Applicable  Age 69  Fall History: Fall within 6 months prior to admission 5  Elimination; Bowel and/or Urine Incontinence 0  Elimination; Bowel and/or Urine Urgency/Frequency 2  Medications: includes PCA/Opiates, Anti-convulsants, Anti-hypertensives, Diuretics, Hypnotics, Laxatives, Sedatives, and Psychotropics 7  Patient Care Equipment 2  Mobility-Assistance 2  Mobility-Gait 0  Mobility-Sensory Deficit 0  Altered awareness of immediate physical environment 0  Impulsiveness 0  Lack of understanding of one's physical/cognitive limitations 0  Total Score 19  Patient Fall Risk Level High fall risk  Adult Fall Risk Interventions  Required Bundle Interventions *See Row Information* High fall risk - low, moderate, and high requirements implemented  Additional Interventions Use of appropriate toileting equipment (bedpan, BSC, etc.)  Screening for Fall Injury Risk (To be completed on HIGH fall risk patients) - Assessing Need for Low Bed  Risk For Fall Injury- Low Bed Criteria None identified - Continue screening  Screening for Fall Injury Risk (To be completed on HIGH fall risk patients who do not meet crieteria for Low Bed) - Assessing Need for Floor Mats Only  Risk For Fall Injury- Criteria for Floor Mats None identified - No additional interventions needed  Vitals  BP 133/75  MAP (mmHg) 92  Pulse Rate 91  ECG Heart Rate 92  Resp (!) 22  Oxygen Therapy  SpO2 99 %  O2 Device Room Air   Patient Activity (if Appropriate) In bed  Pain Assessment  Pain Scale 0-10  Pain Score 0  PCA/Epidural/Spinal Assessment  Respiratory Pattern Regular  Neurological  Neuro (WDL) X  Level of Consciousness Alert  Orientation Level Oriented X4  Cognition Follows commands  Speech Clear  Pupil Assessment  No  Neuro Symptoms None  Neuro Additional Assessments No  Glasgow Coma Scale  Eye Opening 4  Best Verbal Response (NON-intubated) 5  Best Motor Response 6  Glasgow Coma Scale Score 15  Musculoskeletal  Musculoskeletal (WDL) X  Assistive Device BSC  Weight Bearing Restrictions No  Integumentary  Integumentary (WDL) WDL

## 2019-09-28 NOTE — Consult Note (Signed)
TCTS Preliminary Consult Note  Pt seen and examined; chart reviewed.  Full report to follow pending additional studies. In brief, a 69 yo lady presented with a week of abdominal sx to [facility] ED. She was diagnosed with NSTEMI and EKG evidence of septal infarct. LHC yesterday shows severe multivessel CAD and severely depressed LV systolic function. Consult received for CABG.  Stable on exam this morning. Will obtain routine preoperative testing and have asked Dr. [name] to see her regarding optimization for surgical procedure.  Tentatively planning for Tuesday. 3/30.  Brittney Kennedy Z. Brittney Sages, MD [phone]

## 2019-09-28 NOTE — Consult Note (Addendum)
Advanced Heart Failure Team Consult Note   Primary Physician: Patient, No Pcp Per PCP-Cardiologist:  Ida Rogue, MD  Referring: Dr. Orvan Seen   Reason for Consultation: CHF.  HPI:    Philisha Weinel is seen today for evaluation of CAD/CHF at the request of Dr. Orvan Seen   69 y/o woman with obesity, seasonal allergies, PUD, and cholecystectomy. She is originally from [place], [place], but moved to [place] in 2015. She hasn't seen a healthcare provider since. 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 She does not routinely exercise but is able to perform ADL's, housework, and grocery shopping w/o symptoms or limitations.   She was in her USOH until ~ 2 wks ago, when she began to experience nausea that was worsened with eating. As a result, she cut out breakfast and lunch and has only been eating 1 meal/day (tacos for dinner). Though she has been experiencing intermittent vomiting (small volume - clear to yellow) over the course of the past 2 wks, she has not had n/v after eating her evening meal. She presented to the [facility] ED on the afternoon of 3/25 for worsening sympotms.   ECG showed Old septal infarct w/ lateral ST/T changes. HsTrop was 674 followed by 567 583. CT Abd/Pelvis neg for acute intra-abd or pelvic pathology. Small bilat pl effusions w/ partial compressive atx of lower lobes noted. She was placed on heparin and admitted.   Cath on 3/26 revealed severe multivessel CAD and LV dysfxn (EF 25%) she was transferred to [facility] for CHF optimization and CT surgical eval. - LAD 95% prox with severe diffuse disease throughout - RI 90% - OM-3 90%  - mPDA 90% small diffusely diseased  ECHO EF 25-30% RV ok. Mild MR. AoV sclerosis without stenosis.   Currently denies CP or SOB. Last night she fell while getting up to commode and developed large hematoma at R groin cath site. Now unable to raise or externally rotate hip. Vascular u/s haas been ordered by  general cards.   Review of Systems: [y] = yes, [ ]  = no   . General: Weight gain [ ] ; Weight loss [ ] ; Anorexia [ ] ; Fatigue Blue.Reese ]; Fever [ ] ; Chills [ ] ; Weakness [ ]   . Cardiac: Chest pain/pressure [ ] ; Resting SOB [ ] ; Exertional SOB [ ] ; Orthopnea [ ] ; Pedal Edema [ ] ; Palpitations [ ] ; Syncope [ ] ; Presyncope [ ] ; Paroxysmal nocturnal dyspnea[ ]   . Pulmonary: Cough [ ] ; Wheezing[ ] ; Hemoptysis[ ] ; Sputum [ ] ; Snoring [ ]   . GI: Vomiting[ y]; Dysphagia[ ] ; Melena[ ] ; Hematochezia [ ] ; Heartburn[ y]; Abdominal pain Blue.Reese ]; Constipation Blue.Reese ]; Diarrhea [ ] ; BRBPR [ ]   . GU: Hematuria[ ] ; Dysuria [ ] ; Nocturia[ ]   . Vascular: Pain in legs with walking [ ] ; Pain in feet with lying flat [ ] ; Non-healing sores [ ] ; Stroke [ ] ; TIA [ ] ; Slurred speech [ ] ;  . Neuro: Headaches[ ] ; Vertigo[ ] ; Seizures[ ] ; Paresthesias[ ] ;Blurred vision [ ] ; Diplopia [ ] ; Vision changes [ ]   . Ortho/Skin: Arthritis Blue.Reese ]; Joint pain [ y]; Muscle pain [ ] ; Joint swelling [ ] ; Back Pain [ ] ; Rash [ ]   . Psych: Depression[ ] ; Anxiety[ ]   . Heme: Bleeding problems [ ] ; Clotting disorders [ ] ; Anemia [ ]   . Endocrine: Diabetes [ ] ; Thyroid dysfunction[ ]   Home Medications Prior to Admission medications   Medication Sig Start Date End Date  Taking? Authorizing Provider  Ascorbic Acid (VITAMIN C) 1000 MG tablet Take 1,000 mg by mouth daily.    [provider]  cholecalciferol (VITAMIN D3) 25 MCG (1000 UNIT) tablet Take 1,000 Units by mouth daily.    [provider]  Echinacea 125 MG CAPS Take 2 capsules by mouth in the morning and at bedtime.    [provider]  loratadine (CLARITIN) 10 MG tablet Take 10 mg by mouth daily.    [provider]  Multiple Vitamin (MULTIVITAMIN) capsule Take 1 capsule by mouth daily.    [provider]    Past Medical History: Past Medical History:  Diagnosis Date  . CAD (coronary artery disease)    a. 09/2019 NSTEMI/Cath: LM nl, LAD 90p, 99m  diffuse mid-dist dzs, LCX 95mV groove/OM2, RI 90p, RCA dominant, nl, RPDA 9073mF 25%, glob HK.  . HMarland KitchenrEF (heart failure with reduced ejection fraction) ([place]  a. 09/2019 LV gram: EF 25%, global HK.  . PMarland KitchenD (peptic ulcer disease)   . Seasonal allergies     Past Surgical History: Past Surgical History:  Procedure Laterality Date  . CHOLECYSTECTOMY      Family History: Family History  Problem Relation Age of Onset  . Stroke Mother        died @ 89 28 Multiple myeloma Father        died @ 93 19 Multiple myeloma Sister     Social History: Social History   Socioeconomic History  . Marital status: Unknown    Spouse name: Not on file  . Number of children: Not on file  . Years of education: Not on file  . Highest education level: Not on file  Occupational History  . Not on file  Tobacco Use  . Smoking status: Never Smoker  . Smokeless tobacco: Never Used  Substance and Sexual Activity  . Alcohol use: Never  . Drug use: Never  . Sexual activity: Not on file  Other Topics Concern  . Not on file  Social History Narrative  . Not on file   Social Determinants of Health   Financial Resource Strain:   . Difficulty of Paying Living Expenses:   Food Insecurity:   . Worried About RunCharity fundraiser the Last Year:   . RanArboriculturist the Last Year:   Transportation Needs:   . LacFilm/video editoredical):   . LMarland Kitchenck of Transportation (Non-Medical):   Physical Activity:   . Days of Exercise per Week:   . Minutes of Exercise per Session:   Stress:   . Feeling of Stress :   Social Connections:   . Frequency of Communication with Friends and Family:   . Frequency of Social Gatherings with Friends and Family:   . Attends Religious Services:   . Active Member of Clubs or Organizations:   . Attends CluArchivistetings:   . MMarland Kitchenrital Status:     Allergies:  Allergies  Allergen Reactions  . No Healthtouch Food Allergies Anaphylaxis and Hives    Raw  onions    Objective:    Vital Signs:   Temp:  [97.7 F (36.5 C)-98.4 F (36.9 C)] 97.9 F (36.6 C) (03/27 1134) Pulse Rate:  [88-113] 103 (03/27 1134) Resp:  [13-22] 19 (03/27 1134) BP: (124-155)/(62-95) 138/67 (03/27 1134) SpO2:  [93 %-100 %] 97 % (03/27 1134) Weight:  [74.2 kg-74.7 kg] 74.2 kg (03/27 0407) Last BM Date: 09/26/19  Weight  change: Filed Weights   09/27/19 1848 09/28/19 0407  Weight: 74.7 kg 74.2 kg    Intake/Output:   Intake/Output Summary (Last 24 hours) at 09/28/2019 1623 Last data filed at 09/28/2019 1100 Gross per 24 hour  Intake 320.42 ml  Output 2800 ml  Net -2479.58 ml      Physical Exam    General:  Elderly obese woman lying in bed. No resp difficulty HEENT: normal Neck: supple. JVP 6 Carotids 2+ bilat; no bruits. No lymphadenopathy or thyromegaly appreciated. Cor: PMI nondisplaced. Regular rate & rhythm. No rubs, gallops or murmurs. Lungs: clear Abdomen: obese soft, nontender, nondistended. No hepatosplenomegaly. No bruits or masses. Good bowel sounds. Extremities: no cyanosis, clubbing, rash, edema Large hematoma/ecchymosis in right gorin. Tender to touch. No bruit. Limited ROM Neuro: alert & orientedx3, cranial nerves grossly intact. moves all 4 extremities w/o difficulty. Affect pleasant   Telemetry   Sinus tach 100-105 Personally reviewed   EKG    Sinus rhythm 84 anteroseptal qs Personally reviewed   Labs   Basic Metabolic Panel: Recent Labs  Lab 09/26/19 1647 09/27/19 0428 09/28/19 0408  NA 139 143 142  K 3.0* 3.5 3.2*  CL 105 113* 106  CO2 18* 16* 16*  GLUCOSE 128* 112* 107*  BUN 10 10 10   CREATININE 0.77 0.71 0.83  CALCIUM 9.2 8.6* 8.9  MG  --   --  1.4*    Liver Function Tests: Recent Labs  Lab 09/26/19 1647  AST 68*  ALT 30  ALKPHOS 85  BILITOT 1.2  PROT 6.3*  ALBUMIN 3.2*   Recent Labs  Lab 09/26/19 1647  LIPASE 25   No results for input(s): AMMONIA in the last 168 hours.  CBC: Recent Labs   Lab 09/26/19 1647 09/27/19 0428 09/28/19 0408  WBC 4.1 5.1 10.3  NEUTROABS  --  2.9 6.6  HGB 15.3* 13.3 14.5  HCT 44.8 39.7 43.5  MCV 92.8 94.5 94.2  PLT 229 214 240    Cardiac Enzymes: No results for input(s): CKTOTAL, CKMB, CKMBINDEX, TROPONINI in the last 168 hours.  BNP: BNP (last 3 results) No results for input(s): BNP in the last 8760 hours.  ProBNP (last 3 results) No results for input(s): PROBNP in the last 8760 hours.   CBG: No results for input(s): GLUCAP in the last 168 hours.  Coagulation Studies: Recent Labs    09/26/19 2048  LABPROT 13.9  INR 1.1     Imaging    No results found.   Medications:     Current Medications: . aspirin EC  81 mg Oral Daily  . atorvastatin  80 mg Oral q1800  . furosemide  80 mg Intravenous BID  . potassium chloride  40 mEq Oral Daily  . sacubitril-valsartan  1 tablet Oral BID  . sodium chloride flush  3 mL Intravenous Q12H     Infusions: . sodium chloride         Assessment/Plan   1. CAD with NSTEMI - cath with severe diffuse 3v CAD particularly on left side with severe LV dysfunction. Ideally will need CABG, however in looking at her films I worry about the quality of her LAD target and also not sure she would benefit much from a graft to the RCA - I d/w DR. Atkins  - Will plan cMRI to evaluate for viability in anterior wall - If viable will likely need RHC prior to surgery to make sure she is optimized - continue ASA, statin - start low-dose b-blocker and  digoxin  2. Acute systolic HF due to NICM - EF ~25% - volume status ok currently - continue entresto - add low-dose b-blocker and digoxin - start spiro 12.5   3. R groin ecchymosis and pain after fall - has hematoma. No bruit. U/s pending - off heparin - will get CT to exclude fracture - recheck H/H  4. Hypokalemia/Hypomag - supp  - add spiro     Length of Stay: 1  Glori Bickers, MD  09/28/2019, 4:23 PM  Advanced Heart Failure  Team Pager [phone] (M-F; 7a - 4p)  Please contact [facility] for night-coverage after hours (4p -7a ) and weekends on [email or web address]

## 2019-09-28 NOTE — Progress Notes (Signed)
Pts groin access site painful with new bruising and firmness noted, Dr Wyline Mood notified, pt assessed and new orders received.

## 2019-09-28 NOTE — Progress Notes (Signed)
Large thigh hematoma from [place], no pseudoanerusym. Hold heparin, resume tomorrow AM, f/u AM cbc   Dominga Ferry MD

## 2019-09-28 NOTE — Progress Notes (Signed)
VASCULAR LAB PRELIMINARY  PRELIMINARY  PRELIMINARY  PRELIMINARY  Right groin ultrasound to rule out pseudoaneurysm completed.    Preliminary report:  See CV proc for preliminary results.   Chandra Feger, RVT 09/28/2019, 5:04 PM

## 2019-09-28 NOTE — Plan of Care (Signed)
  Problem: Education: Goal: Knowledge of General Education information will improve Description: Including pain rating scale, medication(s)/side effects and non-pharmacologic comfort measures Outcome: Progressing   Problem: Clinical Measurements: Goal: Will remain free from infection Outcome: Progressing   

## 2019-09-28 NOTE — Progress Notes (Signed)
ANTICOAGULATION CONSULT NOTE   Pharmacy Consult for Heparin Indication: Severe 3V CAD  Allergies  Allergen Reactions  . No Healthtouch Food Allergies Anaphylaxis and Hives    Raw onions    Patient Measurements: Height: 5\' 2"  (157.5 cm) Weight: 163 lb 9.3 oz (74.2 kg) IBW/kg (Calculated) : 50.1 Heparin Dosing Weight: 66 kg  Vital Signs: Temp: 98.4 F (36.9 C) (03/27 0407) Temp Source: Oral (03/27 0407) BP: 127/65 (03/27 0407) Pulse Rate: 97 (03/27 0407)  Labs: Recent Labs    09/26/19 1647 09/26/19 1647 09/26/19 1848 09/26/19 2048 09/27/19 0428 09/27/19 0444 09/27/19 1213 09/28/19 0408  HGB 15.3*   < >  --   --  13.3  --   --  14.5  HCT 44.8  --   --   --  39.7  --   --  43.5  PLT 229  --   --   --  214  --   --  240  APTT  --   --   --  40*  --   --   --   --   LABPROT  --   --   --  13.9  --   --   --   --   INR  --   --   --  1.1  --   --   --   --   HEPARINUNFRC  --   --   --   --  0.45  --  0.45 0.10*  CREATININE 0.77  --   --   --  0.71  --   --   --   TROPONINIHS  --   --  674* 567*  --  583*  --   --    < > = values in this interval not displayed.    Estimated Creatinine Clearance: 64.3 mL/min (by C-G formula based on SCr of 0.71 mg/dL).   Medical History: Past Medical History:  Diagnosis Date  . CAD (coronary artery disease)    a. 09/2019 NSTEMI/Cath: LM nl, LAD 90p, 72m, diffuse mid-dist dzs, LCX 71m AV groove/OM2, RI 90p, RCA dominant, nl, RPDA 44m, EF 25%, glob HK.  96m HFrEF (heart failure with reduced ejection fraction) (HCC)    a. 09/2019 LV gram: EF 25%, global HK.  10/2019 PUD (peptic ulcer disease)   . Seasonal allergies      Assessment: CC/HPI: From [facility] after cath to [facility] for for CHF optimization and CT surgical eval.  PMH: seasonal allergies, peptic ulcer dzs, and cholecystectomy, CAD  Significant events: Over the last 2 wks has developed nausea after eating with intermittent vomiting. Now has developed constipation with abdominal bloating.  LE edema.  Anticoag: s/p cath 3/26. Start IV heparin post-cath with severe MV CAD and EF 25%. CBC WNL.  3/27 AM update:  Heparin level low No issues per RN  Goal of Therapy:  Heparin level 0.3-0.7 units/ml Monitor platelets by anticoagulation protocol: Yes   Plan:  Inc heparin to 1100 units/hr 1400 heparin level  4/27, PharmD, BCPS Clinical Pharmacist Phone: [phone]

## 2019-09-28 NOTE — Progress Notes (Signed)
Asked to evaluate cath site by nursing staff, appears Dr Clarise Cruz is to round on patient regardign pre CABG evaluation. She had a mecahnical fall yesterday falling onto left knee. Since that time pain in right groin at cath site. Today palpable hematoma, tender to palpation. We will hold heparin, obtain US to evaluate for possible hematoma vs pseudoaneurysm. Pending [place] results restart hep gtt.    Dominga Ferry MD

## 2019-09-28 NOTE — Progress Notes (Signed)
VASCULAR LAB PRELIMINARY  PRELIMINARY  PRELIMINARY  PRELIMINARY  Pre CABG Dopplers completed.    Preliminary report:  See CV proc for preliminary results.  Demisha Nokes, RVT 09/28/2019, 5:07 PM

## 2019-09-29 ENCOUNTER — Inpatient Hospital Stay (HOSPITAL_COMMUNITY): Payer: Medicare Other

## 2019-09-29 LAB — BASIC METABOLIC PANEL
Anion gap: 12 (ref 5–15)
BUN: 14 mg/dL (ref 8–23)
CO2: 21 mmol/L — ABNORMAL LOW (ref 22–32)
Calcium: 8.7 mg/dL — ABNORMAL LOW (ref 8.9–10.3)
Chloride: 107 mmol/L (ref 98–111)
Creatinine, Ser: 0.75 mg/dL (ref 0.44–1.00)
GFR calc Af Amer: >60 mL/min (ref >60)
GFR calc non Af Amer: >60 mL/min (ref >60)
Glucose, Bld: 150 mg/dL — ABNORMAL HIGH (ref 70–99)
Potassium: 3.5 mmol/L (ref 3.5–5.1)
Sodium: 140 mmol/L (ref 135–145)

## 2019-09-29 LAB — BLOOD GAS, ARTERIAL
Acid-base deficit: 2.1 mmol/L — ABNORMAL HIGH (ref 0.0–2.0)
Bicarbonate: 21.3 mmol/L (ref 20.0–28.0)
FIO2: 21
O2 Saturation: 97.1 %
Patient temperature: 36.8
pCO2 arterial: 30.3 mmHg — ABNORMAL LOW (ref 32.0–48.0)
pH, Arterial: 7.459 — ABNORMAL HIGH (ref 7.350–7.450)
pO2, Arterial: 82.1 mmHg — ABNORMAL LOW (ref 83.0–108.0)

## 2019-09-29 LAB — URINALYSIS, COMPLETE (UACMP) WITH MICROSCOPIC
Bacteria, UA: NONE SEEN
Bilirubin Urine: NEGATIVE
Glucose, UA: NEGATIVE mg/dL
Hgb urine dipstick: NEGATIVE
Ketones, ur: 5 mg/dL — AB
Nitrite: NEGATIVE
Protein, ur: 100 mg/dL — AB
Specific Gravity, Urine: 1.023 (ref 1.005–1.030)
pH: 5 (ref 5.0–8.0)

## 2019-09-29 LAB — CBC
HCT: 35 % — ABNORMAL LOW (ref 36.0–46.0)
Hemoglobin: 11.7 g/dL — ABNORMAL LOW (ref 12.0–15.0)
MCH: 31.5 pg (ref 26.0–34.0)
MCHC: 33.4 g/dL (ref 30.0–36.0)
MCV: 94.1 fL (ref 80.0–100.0)
Platelets: 262 10*3/uL (ref 150–400)
RBC: 3.72 MIL/uL — ABNORMAL LOW (ref 3.87–5.11)
RDW: 13.4 % (ref 11.5–15.5)
WBC: 9.6 10*3/uL (ref 4.0–10.5)
nRBC: 0 % (ref 0.0–0.2)

## 2019-09-29 LAB — RAPID URINE DRUG SCREEN, HOSP PERFORMED
Amphetamines: NOT DETECTED
Barbiturates: NOT DETECTED
Benzodiazepines: POSITIVE — AB
Cocaine: NOT DETECTED
Opiates: NOT DETECTED
Tetrahydrocannabinol: NOT DETECTED

## 2019-09-29 LAB — HEPARIN LEVEL (UNFRACTIONATED): Heparin Unfractionated: <0.1 IU/mL — ABNORMAL LOW (ref 0.30–0.70)

## 2019-09-29 LAB — MAGNESIUM: Magnesium: 2.4 mg/dL (ref 1.7–2.4)

## 2019-09-29 LAB — HEPATIC FUNCTION PANEL
ALT: 19 U/L (ref 0–44)
AST: 40 U/L (ref 15–41)
Albumin: 2.3 g/dL — ABNORMAL LOW (ref 3.5–5.0)
Alkaline Phosphatase: 75 U/L (ref 38–126)
Bilirubin, Direct: 0.5 mg/dL — ABNORMAL HIGH (ref 0.0–0.2)
Indirect Bilirubin: 0.5 mg/dL (ref 0.3–0.9)
Total Bilirubin: 1 mg/dL (ref 0.3–1.2)
Total Protein: 4.9 g/dL — ABNORMAL LOW (ref 6.5–8.1)

## 2019-09-29 LAB — AMMONIA: Ammonia: 16 umol/L (ref 9–35)

## 2019-09-29 LAB — GLUCOSE, CAPILLARY: Glucose-Capillary: 145 mg/dL — ABNORMAL HIGH (ref 70–99)

## 2019-09-29 MED ORDER — ENOXAPARIN SODIUM 40 MG/0.4ML ~~LOC~~ SOLN
40.0000 mg | SUBCUTANEOUS | Status: DC
  Administered 2019-09-29: 40 mg via SUBCUTANEOUS
  Filled 2019-09-29: qty 0.4

## 2019-09-29 NOTE — Progress Notes (Signed)
Run of SVT. Pt was asleep. Will continue to monitor

## 2019-09-29 NOTE — Significant Event (Addendum)
Rapid Response Event Note  Overview: Time Called: 1005 Arrival Time: 1010 Event Type: Other (Comment)(Lethargy, change in speech.) Unwitnessed fall yesterday. Hematoma formation to her right groin following fall. Pt received Oxycodone 5mg  at 2122. RN noticed pt to remain "groggy" throughout the morning.   Initial Focused Assessment: Pt opens eyes to voice, does not sustain eye contact. PERRLA, 63mm. EOMI. Pt oriented x4. Pt is able to follow commands. Face is symmetrical. No drift. Right leg is sore from hematoma, but strength appears equal to left. Mild ataxia in bilateral upper extremities, could also be associated with drowsiness. Sensation is equal. Mild intermittent dysarthria, speech is intelligible. Pt favors the right side by having her head turned to the right, but crosses midline voluntarily. No extinction, no inattention. Pt endorses pain to her right groin, hematoma appears unchanged based on markings.   VS: T 98.3 axillary, BP 123/71 (88), HR 90, RR 16, SpO2 97% on room air, CBG 145  Interventions: -MD at bedside to assess pt -Stat head CT ordered, CT results reported to L. 1m, NP -ABG  7.45/30.3/82.1/21.3  Plan of Care (if not transferred): -Follow-up with MD regarding ABG results -Reevaluation pt for changes in mentation -Avoid narcotics  Event Summary: Name of Physician Notified: Dr. 05-29-1972 at 1005 Outcome: Stayed in room and stabalized Event End Time: 1045 Code stroke not activated due to LKN being 09/28/2019 at approximately 1900. No LVO symptoms.    09/30/2019

## 2019-09-29 NOTE — Progress Notes (Signed)
Responded to Spiritual Consult for delivery of Advance Directive.  Chaplain found patient groggy and difficult to arouse.  Patient did speak softly but Chaplain unable to understand what she was saying.  Left AD on bedside table.  Chaplain stands ready to assist further at any time with Advance Directive.  Vernell Morgans Chaplain Resident

## 2019-09-29 NOTE — Progress Notes (Signed)
Late entry: This is am bedside report, patient was able to wake up but very groggy, MAE to command but speech garbled. Night shift RN reports that patient had Oxycodone and had been like this since receiving the medication. I continue to check on patient and vital signs remained stable and patient would wake with each time I assessed her this am. I felt concerned that patient was not staying awake as much as Id like. I called Mardella Layman, NP as well as Dr. Wyline Mood who saw patient yesterday. I paged Rapid Response just for a second opinion. Patient able to MAE to command and equal both side. Patient taken for head CT and ABG drawn. Plan to see what these results are. Patient has had a rew runs of SVT this am and I notified the MD and NP. Patient unaware at this time to safely take medications.WIl closely monitor

## 2019-09-29 NOTE — Progress Notes (Addendum)
Patient followed by CHF service and CT surgery, asked to evaluate patient for altered mental status. When I had seen her yesterday she was groggy/sedated but more alert than currently  She appears lethargic but arouseable, answers questions but requires asking multiple times. Follows instructions after asking multiple times. On my exam no focal neuro findings. Moves all 4 extremities, no acute findings on CN exam though there is some difficulty having her participate with exam.   She received oxycodone 5 mg at 242 pm, 922pm. Of note she did have a mechanical fall yesterday while on heparin, landing on her left knee by her report but unwitnessed  We will obtain stat CT head, hold any additional pain medications. If negative CT head would get ABG. BG this AM 150. Not on any other sedatives.    Dina Rich MD  Addendum  CT head benign. We will obtain ABG. Add ammonia on to prior labs, check UA.   Dominga Ferry  MD

## 2019-09-29 NOTE — Progress Notes (Signed)
7 beat run of v-tach at 21:38. Pt was asymptomatic. Will continue to monitor.

## 2019-09-29 NOTE — Progress Notes (Signed)
Advanced Heart Failure Rounding Note   Subjective:    Events  - cath 09/27/19 3v CAD. EF 20% - right groin hematoma. U/s 3/27 negative for PSA - altered mental status due to pain meds. Head CT, ABG and ammonia normal  Developed confusion and lethargy after pain meds last night. Seen by Dr. Wyline Mood. Head CT, ABG and ammonia all normal.   Now awake and conversant and following commands but seems a bit drunk. Denies CP or SOB. R groin pain improving.   Objective:   Weight Range:  Vital Signs:   Temp:  [97.7 F (36.5 C)-98.6 F (37 C)] 97.7 F (36.5 C) (03/28 1427) Pulse Rate:  [85-99] 94 (03/28 1345) Resp:  [14-23] 23 (03/28 1345) BP: (101-135)/(49-73) 132/49 (03/28 1427) SpO2:  [94 %-100 %] 100 % (03/28 1345) Weight:  [75.3 kg] 75.3 kg (03/28 0533) Last BM Date: 09/26/19  Weight change: Filed Weights   09/27/19 1848 09/28/19 0407 09/29/19 0533  Weight: 74.7 kg 74.2 kg 75.3 kg    Intake/Output:   Intake/Output Summary (Last 24 hours) at 09/29/2019 1440 Last data filed at 09/28/2019 2200 Gross per 24 hour  Intake 240 ml  Output --  Net 240 ml     Physical Exam: General:  Elderly obese female. Lying in bed No resp difficulty HEENT: normal Neck: supple. no JVD. Carotids 2+ bilat; no bruits. No lymphadenopathy or thryomegaly appreciated. Cor: PMI nondisplaced. Regular rate & rhythm. No rubs, gallops or murmurs. Lungs: clear Abdomen: obese soft, nontender, nondistended. No hepatosplenomegaly. No bruits or masses. Good bowel sounds. Extremities: no cyanosis, clubbing, rash, edema large ecchymosis in right groin. No bruit.  Neuro: alert & orientedx3, cranial nerves grossly intact. moves all 4 extremities w/o difficulty. Affect pleasant but appears a bit silly/drunk   Telemetry: NSR 90s Personally reviewed   Labs: Basic Metabolic Panel: Recent Labs  Lab 09/26/19 1647 09/26/19 1647 09/27/19 0428 09/28/19 0408 09/29/19 0526  NA 139  --  143 142 140  K 3.0*   --  3.5 3.2* 3.5  CL 105  --  113* 106 107  CO2 18*  --  16* 16* 21*  GLUCOSE 128*  --  112* 107* 150*  BUN 10  --  10 10 14   CREATININE 0.77  --  0.71 0.83 0.75  CALCIUM 9.2   < > 8.6* 8.9 8.7*  MG  --   --   --  1.4* 2.4   < > = values in this interval not displayed.    Liver Function Tests: Recent Labs  Lab 09/26/19 1647 09/29/19 1112  AST 68* 40  ALT 30 19  ALKPHOS 85 75  BILITOT 1.2 1.0  PROT 6.3* 4.9*  ALBUMIN 3.2* 2.3*   Recent Labs  Lab 09/26/19 1647  LIPASE 25   Recent Labs  Lab 09/29/19 1112  AMMONIA 16    CBC: Recent Labs  Lab 09/26/19 1647 09/27/19 0428 09/28/19 0408 09/29/19 0526  WBC 4.1 5.1 10.3 9.6  NEUTROABS  --  2.9 6.6  --   HGB 15.3* 13.3 14.5 11.7*  HCT 44.8 39.7 43.5 35.0*  MCV 92.8 94.5 94.2 94.1  PLT 229 214 240 262    Cardiac Enzymes: No results for input(s): CKTOTAL, CKMB, CKMBINDEX, TROPONINI in the last 168 hours.  BNP: BNP (last 3 results) No results for input(s): BNP in the last 8760 hours.  ProBNP (last 3 results) No results for input(s): PROBNP in the last 8760 hours.    Other  results:  Imaging: CT ABDOMEN PELVIS WO CONTRAST  Result Date: 09/28/2019 CLINICAL DATA:  Thoracic aortic disease. Pre operative planning for CABG. EXAM: CT CHEST, ABDOMEN AND PELVIS WITHOUT CONTRAST TECHNIQUE: Multidetector CT imaging of the chest, abdomen and pelvis was performed following the standard protocol without IV contrast. COMPARISON:  CT scan of the abdomen dated 09/26/2019 FINDINGS: CT CHEST FINDINGS Cardiovascular: Aortic atherosclerosis without dilatation of the thoracic aorta. Coronary artery calcifications. Heart is at the upper limits of normal in size. Small pericardial effusion. Mediastinum/Nodes: 9 mm nodule in the right lobe of the thyroid gland. Trachea is normal. Esophagus is normal. Lungs/Pleura: Small bilateral pleural effusions minimal secondary atelectasis at the bases. Lungs are otherwise clear. Musculoskeletal: No  chest wall mass or suspicious bone lesions identified. CT ABDOMEN PELVIS FINDINGS Hepatobiliary: Slight hepatic steatosis. Cholecystectomy. No biliary ductal dilatation. Pancreas: Normal. Spleen: Normal. Adrenals/Urinary Tract: Adrenal glands are normal. There are persistent nephrograms bilaterally without hydronephrosis. Tiny area of linear enhancement in the medial aspect of the upper pole is not felt to be significant. Stomach/Bowel: Stomach is within normal limits. Appendix appears normal. No evidence of bowel wall thickening, distention, or inflammatory changes. There are a few diverticula in the left side of the colon. No diverticulitis. Vascular/Lymphatic: Aortic atherosclerosis. No enlarged abdominal or pelvic lymph nodes. Reproductive: Uterus and ovaries appear normal. Patient's history reports abdominal hysterectomy but that does not appear to be accurate. Other: No abdominal wall hernia or abnormality. No abdominopelvic ascites. Musculoskeletal: There is an incompletely visualized 5 cm intramuscular hematoma in the right rectus femoris muscle. There is slight hemorrhage in the adjacent adductor longus muscle without a defined hematoma. IMPRESSION: 1. Aortic atherosclerosis.  No aneurysmal dilatation or dissection. 2. Incompletely visualized 5 cm intramuscular hematoma in the right rectus femoris muscle. Slight hemorrhage in the adjacent adductor longus muscle without a defined hematoma. 3. Persistent nephrograms bilaterally which could represent acute tubular necrosis. 4. Small bilateral pleural effusions with minimal secondary atelectasis at the bases. 5. Slight hepatic steatosis. 6. 9 mm nodule in the right lobe of the thyroid gland. No follow-up recommended. This recommendation follows ACR consensus guidelines: Managing Incidental Thyroid Nodules Detected on Imaging: White Paper of the ACR Incidental Thyroid Findings Committee. J Am Coll Radiol 2015; 12:143-150. Aortic Atherosclerosis (ICD10-I70.0).  Electronically Signed   By: Lorriane Shire M.D.   On: 09/28/2019 17:01   CT HEAD WO CONTRAST  Result Date: 09/29/2019 CLINICAL DATA:  Altered mental status EXAM: CT HEAD WITHOUT CONTRAST TECHNIQUE: Contiguous axial images were obtained from the base of the skull through the vertex without intravenous contrast. COMPARISON:  None. FINDINGS: Brain: No evidence of acute infarction, hemorrhage, extra-axial collection, ventriculomegaly, or mass effect. Generalized cerebral atrophy. Periventricular white matter low attenuation likely secondary to microangiopathy. Vascular: Cerebrovascular atherosclerotic calcifications are noted. Skull: Negative for fracture or focal lesion. Sinuses/Orbits: Visualized portions of the orbits are unremarkable. Visualized portions of the paranasal sinuses are unremarkable. Visualized portions of the mastoid air cells are unremarkable. Other: None. IMPRESSION: 1. No acute intracranial pathology. 2. Chronic microvascular disease and cerebral atrophy. Electronically Signed   By: Kathreen Devoid   On: 09/29/2019 10:45   CT CHEST WO CONTRAST  Result Date: 09/28/2019 CLINICAL DATA:  Thoracic aortic disease. Pre operative planning for CABG. EXAM: CT CHEST, ABDOMEN AND PELVIS WITHOUT CONTRAST TECHNIQUE: Multidetector CT imaging of the chest, abdomen and pelvis was performed following the standard protocol without IV contrast. COMPARISON:  CT scan of the abdomen dated 09/26/2019 FINDINGS: CT CHEST FINDINGS  Cardiovascular: Aortic atherosclerosis without dilatation of the thoracic aorta. Coronary artery calcifications. Heart is at the upper limits of normal in size. Small pericardial effusion. Mediastinum/Nodes: 9 mm nodule in the right lobe of the thyroid gland. Trachea is normal. Esophagus is normal. Lungs/Pleura: Small bilateral pleural effusions minimal secondary atelectasis at the bases. Lungs are otherwise clear. Musculoskeletal: No chest wall mass or suspicious bone lesions identified. CT  ABDOMEN PELVIS FINDINGS Hepatobiliary: Slight hepatic steatosis. Cholecystectomy. No biliary ductal dilatation. Pancreas: Normal. Spleen: Normal. Adrenals/Urinary Tract: Adrenal glands are normal. There are persistent nephrograms bilaterally without hydronephrosis. Tiny area of linear enhancement in the medial aspect of the upper pole is not felt to be significant. Stomach/Bowel: Stomach is within normal limits. Appendix appears normal. No evidence of bowel wall thickening, distention, or inflammatory changes. There are a few diverticula in the left side of the colon. No diverticulitis. Vascular/Lymphatic: Aortic atherosclerosis. No enlarged abdominal or pelvic lymph nodes. Reproductive: Uterus and ovaries appear normal. Patient's history reports abdominal hysterectomy but that does not appear to be accurate. Other: No abdominal wall hernia or abnormality. No abdominopelvic ascites. Musculoskeletal: There is an incompletely visualized 5 cm intramuscular hematoma in the right rectus femoris muscle. There is slight hemorrhage in the adjacent adductor longus muscle without a defined hematoma. IMPRESSION: 1. Aortic atherosclerosis.  No aneurysmal dilatation or dissection. 2. Incompletely visualized 5 cm intramuscular hematoma in the right rectus femoris muscle. Slight hemorrhage in the adjacent adductor longus muscle without a defined hematoma. 3. Persistent nephrograms bilaterally which could represent acute tubular necrosis. 4. Small bilateral pleural effusions with minimal secondary atelectasis at the bases. 5. Slight hepatic steatosis. 6. 9 mm nodule in the right lobe of the thyroid gland. No follow-up recommended. This recommendation follows ACR consensus guidelines: Managing Incidental Thyroid Nodules Detected on Imaging: White Paper of the ACR Incidental Thyroid Findings Committee. J Am Coll Radiol 2015; 12:143-150. Aortic Atherosclerosis (ICD10-I70.0). Electronically Signed   By: Francene Boyers M.D.   On:  09/28/2019 17:01   VAS [place] GROIN PSEUDOANEURYSM  Result Date: 09/28/2019  ARTERIAL PSEUDOANEURYSM  Exam: Right groin Indications: Patient complains of palpable knot and bruising. History: S/p catheterization. Comparison Study: No prior study Performing Technologist: Sherren Kerns RVS  Examination Guidelines: A complete evaluation includes B-mode imaging, spectral Doppler, color Doppler, and power Doppler as needed of all accessible portions of each vessel. Bilateral testing is considered an integral part of a complete examination. Limited examinations for reoccurring indications may be performed as noted.  Summary: No evidence of pseudoaneurysm, AVF or DVT Large hematoma noted in the proximal thigh measuring 4.46 X 4.65. No evidence of pseudoaneurysm or AV Fistula.   --------------------------------------------------------------------------------    Preliminary    VAS US DOPPLER PRE CABG  Result Date: 09/28/2019 PREOPERATIVE VASCULAR EVALUATION  Indications:      Pre-CABG. Risk Factors:     Coronary artery disease. Other Factors:    Heart failure. Limitations:      Arrythmia Comparison Study: No prior study Performing Technologist: Sherren Kerns RVS  Examination Guidelines: A complete evaluation includes B-mode imaging, spectral Doppler, color Doppler, and power Doppler as needed of all accessible portions of each vessel. Bilateral testing is considered an integral part of a complete examination. Limited examinations for reoccurring indications may be performed as noted.  Right Carotid Findings: +----------+--------+--------+--------+------------+------------------+           PSV cm/sEDV cm/sStenosisDescribe    Comments           +----------+--------+--------+--------+------------+------------------+ CCA Prox  70  24                          intimal thickening +----------+--------+--------+--------+------------+------------------+ CCA Distal79      30                          intimal  thickening +----------+--------+--------+--------+------------+------------------+ ICA Prox  75      34              heterogenousShadowing          +----------+--------+--------+--------+------------+------------------+ ICA Distal76      32                                             +----------+--------+--------+--------+------------+------------------+ ECA       77      16                                             +----------+--------+--------+--------+------------+------------------+ Portions of this table do not appear on this page. +----------+--------+-------+--------+------------+           PSV cm/sEDV cmsDescribeArm Pressure +----------+--------+-------+--------+------------+ [id number]                                  +----------+--------+-------+--------+------------+ +---------+--------+--+--------+--+ VertebralPSV cm/s52EDV cm/s17 +---------+--------+--+--------+--+ Left Carotid Findings: +----------+--------+--------+--------+--------+------------------+           PSV cm/sEDV cm/sStenosisDescribeComments           +----------+--------+--------+--------+--------+------------------+ CCA Prox  73      23                      intimal thickening +----------+--------+--------+--------+--------+------------------+ CCA Distal76      27                      intimal thickening +----------+--------+--------+--------+--------+------------------+ ICA Prox  65      32                                         +----------+--------+--------+--------+--------+------------------+ ICA Distal102     40                                         +----------+--------+--------+--------+--------+------------------+ ECA       104     17                                         +----------+--------+--------+--------+--------+------------------+ +----------+--------+--------+--------+------------+ SubclavianPSV cm/sEDV cm/sDescribeArm Pressure  +----------+--------+--------+--------+------------+           49                      137          +----------+--------+--------+--------+------------+  ABI Findings: +--------+------------------+-----+---------+--------+ Right   Rt Pressure (mmHg)IndexWaveform Comment  +--------+------------------+-----+---------+--------+ Brachial  triphasicIV       +--------+------------------+-----+---------+--------+ +--------+------------------+-----+---------+-------+ Left    Lt Pressure (mmHg)IndexWaveform Comment +--------+------------------+-----+---------+-------+ [id number]                    triphasic        +--------+------------------+-----+---------+-------+  Right Doppler Findings: +--------+--------+-----+---------+--------+ Site    PressureIndexDoppler  Comments +--------+--------+-----+---------+--------+ Brachial             triphasicIV       +--------+--------+-----+---------+--------+ Radial               triphasic         +--------+--------+-----+---------+--------+ Ulnar                triphasic         +--------+--------+-----+---------+--------+  Left Doppler Findings: +--------+--------+-----+---------+--------+ Site    PressureIndexDoppler  Comments +--------+--------+-----+---------+--------+ [id number]          triphasic         +--------+--------+-----+---------+--------+ Radial               triphasic         +--------+--------+-----+---------+--------+ Ulnar                triphasic         +--------+--------+-----+---------+--------+  Summary: Right Carotid: The extracranial vessels were near-normal with only minimal wall                thickening or plaque. Left Carotid: The extracranial vessels were near-normal with only minimal wall               thickening or plaque. Vertebrals:  Bilateral vertebral arteries demonstrate antegrade flow. Subclavians: Normal flow hemodynamics were seen in  bilateral subclavian              arteries. Right Upper Extremity: Doppler waveforms remain within normal limits with right radial compression. Doppler waveforms remain within normal limits with right ulnar compression. Left Upper Extremity: Doppler waveforms remain within normal limits with left radial compression. Doppler waveform obliterate with left ulnar compression.     Preliminary       Medications:     Scheduled Medications: . aspirin EC  81 mg Oral Daily  . atorvastatin  80 mg Oral q1800  . carvedilol  3.125 mg Oral BID WC  . digoxin  0.125 mg Oral Daily  . potassium chloride  40 mEq Oral Daily  . sacubitril-valsartan  1 tablet Oral BID  . sodium chloride flush  3 mL Intravenous Q12H  . spironolactone  12.5 mg Oral Daily     Infusions: . sodium chloride       PRN Medications:  sodium chloride, acetaminophen, ondansetron (ZOFRAN) IV, sodium chloride flush   Assessment/Plan:   1. CAD with NSTEMI - cath with severe diffuse 3v CAD particularly on left side with severe LV dysfunction. Ideally will need CABG, however in looking at her films I worry about the quality of her LAD target and also not sure she would benefit much from a graft to the RCA. I am also concerned about her mobility especially after recent fall an groin hematoma and low albumin - I d/w Dr. Vickey Sages  - Will plan cMRI to evaluate for viability in anterior wall. Ordered for am.  - If viable will likely need RHC prior to assess hemodynamics - continue ASA, statin, b-blocker  2. Acute systolic HF due to NICM - EF ~25% - volume status ok currently. - continue entresto  - low-dose b-blocker, spiro and digoxin started -  volume status ok. Lasix stopped yesterday  3. R groin hematoma after fall - has hematoma at cath site.  - u/s negative for PSA - CT + hematoma. No mention of fracture but not completely imaged. - Hgb 14 -> 12 - off heparin. Will switch to DVT dose lovenox.  No ongoing indication for  systemic heparin. . Follow hgb   4. Hypokalemia/Hypomag - K 3.5. will supp  - continue spiro   5. Altered mental status - likely due to narcotic pain meds - head CT 3/28 ok - ABG ok. NH3 16  6. Severe protein calorie malnutrition  - albumin 2.3  7. Physical deconditioning - consult PT/OT  Length of Stay: 2   Arvilla Meres MD 09/29/2019, 2:40 PM  Advanced Heart Failure Team Pager [phone] (M-F; 7a - 4p)  Please contact [facility] for night-coverage after hours (4p -7a ) and weekends on [email or web address]

## 2019-09-29 NOTE — Progress Notes (Signed)
I texted results of ABG to Laverda Page, NP

## 2019-09-29 NOTE — Progress Notes (Signed)
I spoke with Dr. [name] on rounds explaining my concerns for patients speech continuing to be slurred and rambling. Patients son at bedside and got an update from Dr. [name]. Patients vital signs still stable and although she has no focal deficits, her speech continues to be slurred and she continues to state "I feel drunk." MD felt oxycodone from last night was the culprit to this problem and d/c'd medication. Son at bedside and informed. Patient is alert and responsive with slurred/garbled speech with periods of rambling. Her grips and pushes on arms and legs continue to be normal, no drift. MAE to command, sensation equal in both sides of extremities. Will continue to monitor

## 2019-09-30 ENCOUNTER — Inpatient Hospital Stay (HOSPITAL_COMMUNITY): Payer: Medicare Other

## 2019-09-30 ENCOUNTER — Encounter: Payer: Self-pay | Admitting: Cardiology

## 2019-09-30 ENCOUNTER — Encounter (HOSPITAL_COMMUNITY)
Admission: AD | Disposition: A | Discharge status: Rehabilitation Facility | Payer: Self-pay | Source: Other Acute Inpatient Hospital | Attending: Internal Medicine

## 2019-09-30 DIAGNOSIS — I2510 Atherosclerotic heart disease of native coronary artery without angina pectoris: Secondary | ICD-10-CM

## 2019-09-30 DIAGNOSIS — I639 Cerebral infarction, unspecified: Secondary | ICD-10-CM

## 2019-09-30 DIAGNOSIS — I214 Non-ST elevation (NSTEMI) myocardial infarction: Principal | ICD-10-CM

## 2019-09-30 HISTORY — PX: CORONARY STENT INTERVENTION: CATH118234

## 2019-09-30 HISTORY — PX: LEFT HEART CATH AND CORONARY ANGIOGRAPHY: CATH118249

## 2019-09-30 LAB — BASIC METABOLIC PANEL
Anion gap: 14 (ref 5–15)
BUN: 12 mg/dL (ref 8–23)
CO2: 20 mmol/L — ABNORMAL LOW (ref 22–32)
Calcium: 8.8 mg/dL — ABNORMAL LOW (ref 8.9–10.3)
Chloride: 107 mmol/L (ref 98–111)
Creatinine, Ser: 0.9 mg/dL (ref 0.44–1.00)
GFR calc Af Amer: >60 mL/min (ref >60)
GFR calc non Af Amer: >60 mL/min (ref >60)
Glucose, Bld: 161 mg/dL — ABNORMAL HIGH (ref 70–99)
Potassium: 3.7 mmol/L (ref 3.5–5.1)
Sodium: 141 mmol/L (ref 135–145)

## 2019-09-30 LAB — TROPONIN I (HIGH SENSITIVITY)
Troponin I (High Sensitivity): 531 ng/L (ref <18)
Troponin I (High Sensitivity): 377 ng/L (ref <18)
Troponin I (High Sensitivity): 592 ng/L (ref <18)

## 2019-09-30 LAB — CBC
HCT: 33.5 % — ABNORMAL LOW (ref 36.0–46.0)
Hemoglobin: 11.2 g/dL — ABNORMAL LOW (ref 12.0–15.0)
MCH: 31.6 pg (ref 26.0–34.0)
MCHC: 33.4 g/dL (ref 30.0–36.0)
MCV: 94.6 fL (ref 80.0–100.0)
Platelets: 234 10*3/uL (ref 150–400)
RBC: 3.54 MIL/uL — ABNORMAL LOW (ref 3.87–5.11)
RDW: 13.1 % (ref 11.5–15.5)
WBC: 8.1 10*3/uL (ref 4.0–10.5)
nRBC: 0 % (ref 0.0–0.2)

## 2019-09-30 LAB — GLUCOSE, CAPILLARY
Glucose-Capillary: 157 mg/dL — ABNORMAL HIGH (ref 70–99)
Glucose-Capillary: 158 mg/dL — ABNORMAL HIGH (ref 70–99)

## 2019-09-30 LAB — HEPARIN LEVEL (UNFRACTIONATED): Heparin Unfractionated: <0.1 IU/mL — ABNORMAL LOW (ref 0.30–0.70)

## 2019-09-30 LAB — POCT ACTIVATED CLOTTING TIME: Activated Clotting Time: 334 seconds

## 2019-09-30 SURGERY — LEFT HEART CATH AND CORONARY ANGIOGRAPHY
Anesthesia: LOCAL

## 2019-09-30 MED ORDER — SODIUM CHLORIDE 0.9 % IV SOLN
750.0000 mg | INTRAVENOUS | Status: DC
  Filled 2019-09-30: qty 750

## 2019-09-30 MED ORDER — HEPARIN (PORCINE) IN NACL 1000-0.9 UT/500ML-% IV SOLN
INTRAVENOUS | Status: AC
  Filled 2019-09-30: qty 1000

## 2019-09-30 MED ORDER — SODIUM CHLORIDE 0.9 % IV SOLN
INTRAVENOUS | Status: AC | PRN
  Administered 2019-09-30: 4 ug/kg/min via INTRAVENOUS

## 2019-09-30 MED ORDER — GADOBUTROL 1 MMOL/ML IV SOLN
8.0000 mL | Freq: Once | INTRAVENOUS | Status: AC | PRN
  Administered 2019-09-30: 8 mL via INTRAVENOUS

## 2019-09-30 MED ORDER — HEPARIN SODIUM (PORCINE) 1000 UNIT/ML IJ SOLN
INTRAMUSCULAR | Status: AC
  Filled 2019-09-30: qty 1

## 2019-09-30 MED ORDER — LIDOCAINE HCL (PF) 1 % IJ SOLN
INTRAMUSCULAR | Status: AC
  Filled 2019-09-30: qty 30

## 2019-09-30 MED ORDER — NITROGLYCERIN IN D5W 200-5 MCG/ML-% IV SOLN
2.0000 ug/min | INTRAVENOUS | Status: DC
  Filled 2019-09-30: qty 250

## 2019-09-30 MED ORDER — MILRINONE LACTATE IN DEXTROSE 20-5 MG/100ML-% IV SOLN
0.3000 ug/kg/min | INTRAVENOUS | Status: DC
  Filled 2019-09-30: qty 100

## 2019-09-30 MED ORDER — PHENYLEPHRINE HCL-NACL 20-0.9 MG/250ML-% IV SOLN
30.0000 ug/min | INTRAVENOUS | Status: DC
  Filled 2019-09-30: qty 250

## 2019-09-30 MED ORDER — INSULIN REGULAR(HUMAN) IN NACL 100-0.9 UT/100ML-% IV SOLN
INTRAVENOUS | Status: DC
  Filled 2019-09-30: qty 100

## 2019-09-30 MED ORDER — DEXMEDETOMIDINE HCL IN NACL 400 MCG/100ML IV SOLN
0.1000 ug/kg/h | INTRAVENOUS | Status: DC
  Filled 2019-09-30: qty 100

## 2019-09-30 MED ORDER — SODIUM CHLORIDE 0.9 % IV SOLN
INTRAVENOUS | Status: DC
  Filled 2019-09-30: qty 30

## 2019-09-30 MED ORDER — VERAPAMIL HCL 2.5 MG/ML IV SOLN
INTRAVENOUS | Status: DC | PRN
  Administered 2019-09-30: 10 mL via INTRA_ARTERIAL

## 2019-09-30 MED ORDER — SODIUM CHLORIDE 0.9 % IV SOLN: 250.0000 mL | INTRAVENOUS | Status: DC | PRN

## 2019-09-30 MED ORDER — HEPARIN (PORCINE) IN NACL 1000-0.9 UT/500ML-% IV SOLN
INTRAVENOUS | Status: DC | PRN
  Administered 2019-09-30 (×2): 500 mL

## 2019-09-30 MED ORDER — CHLORHEXIDINE GLUCONATE CLOTH 2 % EX PADS
6.0000 | MEDICATED_PAD | Freq: Every day | CUTANEOUS | Status: DC
  Administered 2019-10-01 – 2019-10-05 (×5): 6 via TOPICAL

## 2019-09-30 MED ORDER — MANNITOL 20 % IV SOLN
Freq: Once | INTRAVENOUS | Status: DC
  Filled 2019-09-30: qty 13

## 2019-09-30 MED ORDER — ASPIRIN 81 MG PO CHEW: 81.0000 mg | CHEWABLE_TABLET | Freq: Every day | ORAL | Status: DC

## 2019-09-30 MED ORDER — TRANEXAMIC ACID (OHS) PUMP PRIME SOLUTION
2.0000 mg/kg | INTRAVENOUS | Status: DC
  Filled 2019-09-30: qty 1.46

## 2019-09-30 MED ORDER — TRANEXAMIC ACID 1000 MG/10ML IV SOLN
1.5000 mg/kg/h | INTRAVENOUS | Status: DC
  Filled 2019-09-30: qty 25

## 2019-09-30 MED ORDER — SODIUM CHLORIDE 0.9 % IV SOLN
1.5000 g | INTRAVENOUS | Status: DC
  Filled 2019-09-30: qty 1.5

## 2019-09-30 MED ORDER — CLOPIDOGREL BISULFATE 300 MG PO TABS
600.0000 mg | ORAL_TABLET | Freq: Once | ORAL | Status: AC
  Administered 2019-09-30: 600 mg
  Filled 2019-09-30: qty 2

## 2019-09-30 MED ORDER — SODIUM CHLORIDE 0.9 % IV SOLN
INTRAVENOUS | Status: AC | PRN
  Administered 2019-09-30: 20 mL via INTRAVENOUS

## 2019-09-30 MED ORDER — CLOPIDOGREL BISULFATE 75 MG PO TABS: 75.0000 mg | ORAL_TABLET | Freq: Every day | ORAL | Status: DC

## 2019-09-30 MED ORDER — CANGRELOR BOLUS VIA INFUSION
INTRAVENOUS | Status: DC | PRN
  Administered 2019-09-30: 2187 ug via INTRAVENOUS

## 2019-09-30 MED ORDER — NITROGLYCERIN 0.4 MG SL SUBL
0.4000 mg | SUBLINGUAL_TABLET | SUBLINGUAL | Status: DC | PRN
  Administered 2019-09-30 (×2): 0.4 mg via SUBLINGUAL
  Filled 2019-09-30: qty 1

## 2019-09-30 MED ORDER — ENOXAPARIN SODIUM 40 MG/0.4ML ~~LOC~~ SOLN
40.0000 mg | SUBCUTANEOUS | Status: DC
  Administered 2019-10-01 – 2019-10-07 (×7): 40 mg via SUBCUTANEOUS
  Filled 2019-09-30 (×7): qty 0.4

## 2019-09-30 MED ORDER — POTASSIUM CHLORIDE 2 MEQ/ML IV SOLN
80.0000 meq | INTRAVENOUS | Status: DC
  Filled 2019-09-30: qty 40

## 2019-09-30 MED ORDER — EPINEPHRINE HCL 5 MG/250ML IV SOLN IN NS
0.0000 ug/min | INTRAVENOUS | Status: DC
  Filled 2019-09-30: qty 250

## 2019-09-30 MED ORDER — IOHEXOL 350 MG/ML SOLN
INTRAVENOUS | Status: DC | PRN
  Administered 2019-09-30: 150 mL

## 2019-09-30 MED ORDER — CANGRELOR TETRASODIUM 50 MG IV SOLR
INTRAVENOUS | Status: AC
  Filled 2019-09-30: qty 50

## 2019-09-30 MED ORDER — HEPARIN SODIUM (PORCINE) 1000 UNIT/ML IJ SOLN
INTRAMUSCULAR | Status: DC | PRN
  Administered 2019-09-30: 8000 [IU] via INTRAVENOUS

## 2019-09-30 MED ORDER — VANCOMYCIN HCL 1250 MG/250ML IV SOLN
1250.0000 mg | INTRAVENOUS | Status: DC
  Filled 2019-09-30: qty 250

## 2019-09-30 MED ORDER — SODIUM CHLORIDE 0.9 % IV SOLN
4.0000 ug/kg/min | INTRAVENOUS | Status: DC
  Filled 2019-09-30: qty 50

## 2019-09-30 MED ORDER — LIDOCAINE HCL (PF) 1 % IJ SOLN
INTRAMUSCULAR | Status: DC | PRN
  Administered 2019-09-30: 2 mL

## 2019-09-30 MED ORDER — SODIUM CHLORIDE 0.9% FLUSH: 3.0000 mL | INTRAVENOUS | Status: DC | PRN

## 2019-09-30 MED ORDER — PLASMA-LYTE 148 IV SOLN
INTRAVENOUS | Status: DC
  Filled 2019-09-30: qty 2.5

## 2019-09-30 MED ORDER — TRANEXAMIC ACID (OHS) BOLUS VIA INFUSION: 15.0000 mg/kg | INTRAVENOUS | Status: DC

## 2019-09-30 MED ORDER — NITROGLYCERIN 1 MG/10 ML FOR IR/CATH LAB
INTRA_ARTERIAL | Status: DC | PRN
  Administered 2019-09-30: 200 ug via INTRACORONARY

## 2019-09-30 MED ORDER — VERAPAMIL HCL 2.5 MG/ML IV SOLN
INTRAVENOUS | Status: AC
  Filled 2019-09-30: qty 2

## 2019-09-30 MED ORDER — NITROGLYCERIN 1 MG/10 ML FOR IR/CATH LAB
INTRA_ARTERIAL | Status: AC
  Filled 2019-09-30: qty 10

## 2019-09-30 MED ORDER — SODIUM CHLORIDE 0.9% FLUSH
3.0000 mL | Freq: Two times a day (BID) | INTRAVENOUS | Status: DC
  Administered 2019-10-01 – 2019-10-05 (×5): 3 mL via INTRAVENOUS

## 2019-09-30 MED ORDER — IOHEXOL 350 MG/ML SOLN
100.0000 mL | Freq: Once | INTRAVENOUS | Status: AC | PRN
  Administered 2019-09-30: 100 mL via INTRAVENOUS

## 2019-09-30 MED ORDER — NOREPINEPHRINE 4 MG/250ML-% IV SOLN
0.0000 ug/min | INTRAVENOUS | Status: DC
  Filled 2019-09-30 (×2): qty 250

## 2019-09-30 MED ORDER — NOREPINEPHRINE 4 MG/250ML-% IV SOLN
0.0000 ug/min | INTRAVENOUS | Status: DC
  Filled 2019-09-30: qty 250

## 2019-09-30 SURGICAL SUPPLY — 21 items
BALLN SAPPHIRE 2.25X10 (BALLOONS) ×2
BALLN SAPPHIRE ~~LOC~~ 2.5X15 (BALLOONS) ×2 IMPLANT
BALLN SAPPHIRE ~~LOC~~ 3.0X10 (BALLOONS) ×2 IMPLANT
BALLOON SAPPHIRE 2.25X10 (BALLOONS) ×1 IMPLANT
CATH LAUNCHER 6FR EBU3.5 (CATHETERS) ×2 IMPLANT
DEVICE RAD COMP TR BAND LRG (VASCULAR PRODUCTS) ×2 IMPLANT
ELECT DEFIB PAD ADLT CADENCE (PAD) ×2 IMPLANT
GLIDESHEATH SLEND SS 6F .021 (SHEATH) ×2 IMPLANT
GUIDEWIRE INQWIRE 1.5J.035X260 (WIRE) ×1 IMPLANT
INQWIRE 1.5J .035X260CM (WIRE) ×2
KIT ENCORE 26 ADVANTAGE (KITS) ×2 IMPLANT
KIT HEART LEFT (KITS) ×2 IMPLANT
PACK CARDIAC CATHETERIZATION (CUSTOM PROCEDURE TRAY) ×2 IMPLANT
SHEATH PROBE COVER 6X72 (BAG) ×2 IMPLANT
STENT RESOLUTE ONYX 2.5X26 (Permanent Stent) ×2 IMPLANT
STENT RESOLUTE ONYX 3.0X12 (Permanent Stent) ×2 IMPLANT
TRANSDUCER W/STOPCOCK (MISCELLANEOUS) ×2 IMPLANT
TUBING CIL FLEX 10 FLL-RA (TUBING) ×2 IMPLANT
WIRE ASAHI PROWATER 180CM (WIRE) ×2 IMPLANT
WIRE HI TORQ VERSACORE-J 145CM (WIRE) ×2 IMPLANT
WIRE PT2 MS 185 (WIRE) ×2 IMPLANT

## 2019-09-30 NOTE — Interval H&P Note (Signed)
History and Physical Interval Note:  09/30/2019 4:31 PM  Brittney Kennedy  has presented today for surgery, with the diagnosis of urgent.  The various methods of treatment have been discussed with the patient and family. After consideration of risks, benefits and other options for treatment, the patient has consented to  Procedure(s): LEFT HEART CATH AND CORONARY ANGIOGRAPHY (N/A) as a surgical intervention.  The patient's history has been reviewed, patient examined, no change in status, stable for surgery.  I have reviewed the patient's chart and labs.  Questions were answered to the patient's satisfaction.   Cath Lab Visit (complete for each Cath Lab visit)  Clinical Evaluation Leading to the Procedure:   ACS: Yes.    Non-ACS:    Anginal Classification: CCS IV  Anti-ischemic medical therapy: Maximal Therapy (2 or more classes of medications)  Non-Invasive Test Results: No non-invasive testing performed  Prior CABG: Previous CABG        Brittney Kennedy [facility] 09/30/2019 4:31 PM

## 2019-09-30 NOTE — H&P (View-Only) (Signed)
Advanced Heart Failure Rounding Note   Subjective:    Events  - cath 09/27/19 3v CAD. EF 20% - right groin hematoma. U/s 3/27 negative for PSA - 3/28 altered mental status due to pain meds. Head CT, ABG and ammonia normal   Had CMRI today.   Earlier this morning 0500 rapid response was called due to AMS and left facial droop/left sided weakness CT of head completed 0459 am. Arby [facility] APP notified of change and orders received. CT findings communicated to C. Bodenheimer APP.    Drowsy but arousable.     Objective:   Weight Range:  Vital Signs:   Temp:  [97.7 F (36.5 C)-98.5 F (36.9 C)] 98.5 F (36.9 C) (03/29 0421) Pulse Rate:  [84-96] 96 (03/29 0834) Resp:  [17-23] 20 (03/29 0421) BP: (115-137)/(49-78) 137/78 (03/29 0834) SpO2:  [95 %-100 %] 99 % (03/29 0421) Weight:  [72.9 kg] 72.9 kg (03/29 0421) Last BM Date: 09/26/19  Weight change: Filed Weights   09/28/19 0407 09/29/19 0533 09/30/19 0421  Weight: 74.2 kg 75.3 kg 72.9 kg    Intake/Output:   Intake/Output Summary (Last 24 hours) at 09/30/2019 1137 Last data filed at 09/30/2019 0005 Gross per 24 hour  Intake 123 ml  Output 300 ml  Net -177 ml     Physical Exam: General:  Drowsy. No resp difficulty HEENT: normal Neck: supple. no JVD. Carotids 2+ bilat; no bruits. No lymphadenopathy or thryomegaly appreciated. Cor: PMI nondisplaced. Regular rate & rhythm. No rubs, gallops or murmurs. Lungs: clear was on room air.  Abdomen: soft, nontender, nondistended. No hepatosplenomegaly. No bruits or masses. Good bowel sounds. Extremities: no cyanosis, clubbing, rash, edema. R groin hematoma.  Neuro: Left sided facial droop. Drowsy. Alert & orientedx2. Moves all 4 extremities. LUE weaker.  Affect flat. Speech garbled.    Telemetry: NSR 70-80s   Labs: Basic Metabolic Panel: Recent Labs  Lab 09/26/19 1647 09/26/19 1647 09/27/19 0428 09/27/19 0428 09/28/19 0408 09/29/19 0526 09/30/19 0351  NA  139  --  143  --  142 140 141  K 3.0*  --  3.5  --  3.2* 3.5 3.7  CL 105  --  113*  --  106 107 107  CO2 18*  --  16*  --  16* 21* 20*  GLUCOSE 128*  --  112*  --  107* 150* 161*  BUN 10  --  10  --  10 14 12   CREATININE 0.77  --  0.71  --  0.83 0.75 0.90  CALCIUM 9.2   < > 8.6*   < > 8.9 8.7* 8.8*  MG  --   --   --   --  1.4* 2.4  --    < > = values in this interval not displayed.    Liver Function Tests: Recent Labs  Lab 09/26/19 1647 09/29/19 1112  AST 68* 40  ALT 30 19  ALKPHOS 85 75  BILITOT 1.2 1.0  PROT 6.3* 4.9*  ALBUMIN 3.2* 2.3*   Recent Labs  Lab 09/26/19 1647  LIPASE 25   Recent Labs  Lab 09/29/19 1112  AMMONIA 16    CBC: Recent Labs  Lab 09/26/19 1647 09/27/19 0428 09/28/19 0408 09/29/19 0526 09/30/19 0351  WBC 4.1 5.1 10.3 9.6 8.1  NEUTROABS  --  2.9 6.6  --   --   HGB 15.3* 13.3 14.5 11.7* 11.2*  HCT 44.8 39.7 43.5 35.0* 33.5*  MCV 92.8 94.5 94.2 94.1 94.6  PLT 229 214 240 262 234    Cardiac Enzymes: No results for input(s): CKTOTAL, CKMB, CKMBINDEX, TROPONINI in the last 168 hours.  BNP: BNP (last 3 results) No results for input(s): BNP in the last 8760 hours.  ProBNP (last 3 results) No results for input(s): PROBNP in the last 8760 hours.    Other results:  Imaging: CT ABDOMEN PELVIS WO CONTRAST  Result Date: 09/28/2019 CLINICAL DATA:  Thoracic aortic disease. Pre operative planning for CABG. EXAM: CT CHEST, ABDOMEN AND PELVIS WITHOUT CONTRAST TECHNIQUE: Multidetector CT imaging of the chest, abdomen and pelvis was performed following the standard protocol without IV contrast. COMPARISON:  CT scan of the abdomen dated 09/26/2019 FINDINGS: CT CHEST FINDINGS Cardiovascular: Aortic atherosclerosis without dilatation of the thoracic aorta. Coronary artery calcifications. Heart is at the upper limits of normal in size. Small pericardial effusion. Mediastinum/Nodes: 9 mm nodule in the right lobe of the thyroid gland. Trachea is normal.  Esophagus is normal. Lungs/Pleura: Small bilateral pleural effusions minimal secondary atelectasis at the bases. Lungs are otherwise clear. Musculoskeletal: No chest wall mass or suspicious bone lesions identified. CT ABDOMEN PELVIS FINDINGS Hepatobiliary: Slight hepatic steatosis. Cholecystectomy. No biliary ductal dilatation. Pancreas: Normal. Spleen: Normal. Adrenals/Urinary Tract: Adrenal glands are normal. There are persistent nephrograms bilaterally without hydronephrosis. Tiny area of linear enhancement in the medial aspect of the upper pole is not felt to be significant. Stomach/Bowel: Stomach is within normal limits. Appendix appears normal. No evidence of bowel wall thickening, distention, or inflammatory changes. There are a few diverticula in the left side of the colon. No diverticulitis. Vascular/Lymphatic: Aortic atherosclerosis. No enlarged abdominal or pelvic lymph nodes. Reproductive: Uterus and ovaries appear normal. Patient's history reports abdominal hysterectomy but that does not appear to be accurate. Other: No abdominal wall hernia or abnormality. No abdominopelvic ascites. Musculoskeletal: There is an incompletely visualized 5 cm intramuscular hematoma in the right rectus femoris muscle. There is slight hemorrhage in the adjacent adductor longus muscle without a defined hematoma. IMPRESSION: 1. Aortic atherosclerosis.  No aneurysmal dilatation or dissection. 2. Incompletely visualized 5 cm intramuscular hematoma in the right rectus femoris muscle. Slight hemorrhage in the adjacent adductor longus muscle without a defined hematoma. 3. Persistent nephrograms bilaterally which could represent acute tubular necrosis. 4. Small bilateral pleural effusions with minimal secondary atelectasis at the bases. 5. Slight hepatic steatosis. 6. 9 mm nodule in the right lobe of the thyroid gland. No follow-up recommended. This recommendation follows ACR consensus guidelines: Managing Incidental Thyroid  Nodules Detected on Imaging: White Paper of the ACR Incidental Thyroid Findings Committee. J Am Coll Radiol 2015; 12:143-150. Aortic Atherosclerosis (ICD10-I70.0). Electronically Signed   By: Lorriane Shire M.D.   On: 09/28/2019 17:01   CT HEAD WO CONTRAST  Result Date: 09/30/2019 CLINICAL DATA:  Change in mental status with left arm weakness EXAM: CT HEAD WITHOUT CONTRAST TECHNIQUE: Contiguous axial images were obtained from the base of the skull through the vertex without intravenous contrast. COMPARISON:  Head CT from yesterday FINDINGS: Brain: Low-density in the right caudate head and anterior body, also involving the upper right putamen. There is also indistinct appearance to the left putamen. Low-density in the right pons with fairly defined and flat appearance on reformats. No visible cortex infarct. No hemorrhage, hydrocephalus, or masslike finding. Vascular: No hyperdense vessel Skull: Negative Sinuses/Orbits: Anterior septal defect without nodularity. Other: These results will be called to the ordering clinician or representative by the Radiologist Assistant, and communication documented in the PACS or [employer].  IMPRESSION: 1. Right basal ganglia infarct, likely recent given the history. 2. Ischemic change also seen at the left putamen, age indeterminate by imaging. 3. Small remote appearing right pontine infarct. Electronically Signed   By: Monte Fantasia M.D.   On: 09/30/2019 04:59   CT HEAD WO CONTRAST  Result Date: 09/29/2019 CLINICAL DATA:  Altered mental status EXAM: CT HEAD WITHOUT CONTRAST TECHNIQUE: Contiguous axial images were obtained from the base of the skull through the vertex without intravenous contrast. COMPARISON:  None. FINDINGS: Brain: No evidence of acute infarction, hemorrhage, extra-axial collection, ventriculomegaly, or mass effect. Generalized cerebral atrophy. Periventricular white matter low attenuation likely secondary to microangiopathy. Vascular:  Cerebrovascular atherosclerotic calcifications are noted. Skull: Negative for fracture or focal lesion. Sinuses/Orbits: Visualized portions of the orbits are unremarkable. Visualized portions of the paranasal sinuses are unremarkable. Visualized portions of the mastoid air cells are unremarkable. Other: None. IMPRESSION: 1. No acute intracranial pathology. 2. Chronic microvascular disease and cerebral atrophy. Electronically Signed   By: Kathreen Devoid   On: 09/29/2019 10:45   CT CHEST WO CONTRAST  Result Date: 09/28/2019 CLINICAL DATA:  Thoracic aortic disease. Pre operative planning for CABG. EXAM: CT CHEST, ABDOMEN AND PELVIS WITHOUT CONTRAST TECHNIQUE: Multidetector CT imaging of the chest, abdomen and pelvis was performed following the standard protocol without IV contrast. COMPARISON:  CT scan of the abdomen dated 09/26/2019 FINDINGS: CT CHEST FINDINGS Cardiovascular: Aortic atherosclerosis without dilatation of the thoracic aorta. Coronary artery calcifications. Heart is at the upper limits of normal in size. Small pericardial effusion. Mediastinum/Nodes: 9 mm nodule in the right lobe of the thyroid gland. Trachea is normal. Esophagus is normal. Lungs/Pleura: Small bilateral pleural effusions minimal secondary atelectasis at the bases. Lungs are otherwise clear. Musculoskeletal: No chest wall mass or suspicious bone lesions identified. CT ABDOMEN PELVIS FINDINGS Hepatobiliary: Slight hepatic steatosis. Cholecystectomy. No biliary ductal dilatation. Pancreas: Normal. Spleen: Normal. Adrenals/Urinary Tract: Adrenal glands are normal. There are persistent nephrograms bilaterally without hydronephrosis. Tiny area of linear enhancement in the medial aspect of the upper pole is not felt to be significant. Stomach/Bowel: Stomach is within normal limits. Appendix appears normal. No evidence of bowel wall thickening, distention, or inflammatory changes. There are a few diverticula in the left side of the colon. No  diverticulitis. Vascular/Lymphatic: Aortic atherosclerosis. No enlarged abdominal or pelvic lymph nodes. Reproductive: Uterus and ovaries appear normal. Patient's history reports abdominal hysterectomy but that does not appear to be accurate. Other: No abdominal wall hernia or abnormality. No abdominopelvic ascites. Musculoskeletal: There is an incompletely visualized 5 cm intramuscular hematoma in the right rectus femoris muscle. There is slight hemorrhage in the adjacent adductor longus muscle without a defined hematoma. IMPRESSION: 1. Aortic atherosclerosis.  No aneurysmal dilatation or dissection. 2. Incompletely visualized 5 cm intramuscular hematoma in the right rectus femoris muscle. Slight hemorrhage in the adjacent adductor longus muscle without a defined hematoma. 3. Persistent nephrograms bilaterally which could represent acute tubular necrosis. 4. Small bilateral pleural effusions with minimal secondary atelectasis at the bases. 5. Slight hepatic steatosis. 6. 9 mm nodule in the right lobe of the thyroid gland. No follow-up recommended. This recommendation follows ACR consensus guidelines: Managing Incidental Thyroid Nodules Detected on Imaging: White Paper of the ACR Incidental Thyroid Findings Committee. J Am Coll Radiol 2015; 12:143-150. Aortic Atherosclerosis (ICD10-I70.0). Electronically Signed   By: Lorriane Shire M.D.   On: 09/28/2019 17:01   VAS [place] GROIN PSEUDOANEURYSM  Result Date: 09/29/2019  ARTERIAL PSEUDOANEURYSM  Exam: Right groin Indications:  Patient complains of palpable knot and bruising. History: S/p catheterization. Comparison Study: No prior study Performing Technologist: Sharion Dove RVS  Examination Guidelines: A complete evaluation includes B-mode imaging, spectral Doppler, color Doppler, and power Doppler as needed of all accessible portions of each vessel. Bilateral testing is considered an integral part of a complete examination. Limited examinations for reoccurring  indications may be performed as noted.  Summary: No evidence of pseudoaneurysm, AVF or DVT Large hematoma noted in the proximal thigh measuring 4.46 X 4.65. No evidence of pseudoaneurysm or AV Fistula. Diagnosing physician: Deitra Mayo MD Electronically signed by Deitra Mayo MD on 09/29/2019 at 6:36:42 PM.   --------------------------------------------------------------------------------    Final    VAS US DOPPLER PRE CABG  Result Date: 09/29/2019 PREOPERATIVE VASCULAR EVALUATION  Indications:      Pre-CABG. Risk Factors:     Coronary artery disease. Other Factors:    Heart failure. Limitations:      Arrythmia Comparison Study: No prior study Performing Technologist: Sharion Dove RVS  Examination Guidelines: A complete evaluation includes B-mode imaging, spectral Doppler, color Doppler, and power Doppler as needed of all accessible portions of each vessel. Bilateral testing is considered an integral part of a complete examination. Limited examinations for reoccurring indications may be performed as noted.  Right Carotid Findings: +----------+--------+--------+--------+------------+------------------+           PSV cm/sEDV cm/sStenosisDescribe    Comments           +----------+--------+--------+--------+------------+------------------+ CCA Prox  70      24                          intimal thickening +----------+--------+--------+--------+------------+------------------+ CCA Distal79      30                          intimal thickening +----------+--------+--------+--------+------------+------------------+ ICA Prox  75      34              heterogenousShadowing          +----------+--------+--------+--------+------------+------------------+ ICA Distal76      32                                             +----------+--------+--------+--------+------------+------------------+ ECA       77      16                                              +----------+--------+--------+--------+------------+------------------+ Portions of this table do not appear on this page. +----------+--------+-------+--------+------------+           PSV cm/sEDV cmsDescribeArm Pressure +----------+--------+-------+--------+------------+ [id number]                                  +----------+--------+-------+--------+------------+ +---------+--------+--+--------+--+ VertebralPSV cm/s52EDV cm/s17 +---------+--------+--+--------+--+ Left Carotid Findings: +----------+--------+--------+--------+--------+------------------+           PSV cm/sEDV cm/sStenosisDescribeComments           +----------+--------+--------+--------+--------+------------------+ CCA Prox  73      23                      intimal thickening +----------+--------+--------+--------+--------+------------------+  CCA Distal76      27                      intimal thickening +----------+--------+--------+--------+--------+------------------+ ICA Prox  65      32                                         +----------+--------+--------+--------+--------+------------------+ ICA Distal102     40                                         +----------+--------+--------+--------+--------+------------------+ ECA       104     17                                         +----------+--------+--------+--------+--------+------------------+ +----------+--------+--------+--------+------------+ SubclavianPSV cm/sEDV cm/sDescribeArm Pressure +----------+--------+--------+--------+------------+           49                      137          +----------+--------+--------+--------+------------+  ABI Findings: +--------+------------------+-----+---------+--------+ Right   Rt Pressure (mmHg)IndexWaveform Comment  +--------+------------------+-----+---------+--------+ Brachial                       triphasicIV        +--------+------------------+-----+---------+--------+ +--------+------------------+-----+---------+-------+ Left    Lt Pressure (mmHg)IndexWaveform Comment +--------+------------------+-----+---------+-------+ [id number]                    triphasic        +--------+------------------+-----+---------+-------+  Right Doppler Findings: +--------+--------+-----+---------+--------+ Site    PressureIndexDoppler  Comments +--------+--------+-----+---------+--------+ Brachial             triphasicIV       +--------+--------+-----+---------+--------+ Radial               triphasic         +--------+--------+-----+---------+--------+ Ulnar                triphasic         +--------+--------+-----+---------+--------+  Left Doppler Findings: +--------+--------+-----+---------+--------+ Site    PressureIndexDoppler  Comments +--------+--------+-----+---------+--------+ [id number]          triphasic         +--------+--------+-----+---------+--------+ Radial               triphasic         +--------+--------+-----+---------+--------+ Ulnar                triphasic         +--------+--------+-----+---------+--------+  Summary: Right Carotid: The extracranial vessels were near-normal with only minimal wall                thickening or plaque. Left Carotid: The extracranial vessels were near-normal with only minimal wall               thickening or plaque. Vertebrals:  Bilateral vertebral arteries demonstrate antegrade flow. Subclavians: Normal flow hemodynamics were seen in bilateral subclavian              arteries. Right Upper Extremity: Doppler waveforms remain within normal limits with right radial compression. Doppler waveforms remain within normal limits with right ulnar compression.  Left Upper Extremity: Doppler waveforms remain within normal limits with left radial compression. Doppler waveform obliterate with left ulnar compression.  Electronically signed by  Deitra Mayo MD on 09/29/2019 at 6:36:27 PM.    Final      Medications:     Scheduled Medications: . aspirin EC  81 mg Oral Daily  . atorvastatin  80 mg Oral q1800  . carvedilol  3.125 mg Oral BID WC  . digoxin  0.125 mg Oral Daily  . enoxaparin (LOVENOX) injection  40 mg Subcutaneous Q24H  . [START ON 10/01/2019] epinephrine  0-10 mcg/min Intravenous To OR  . [START ON 10/01/2019] heparin-papaverine-plasmalyte irrigation   Irrigation To OR  . [START ON 10/01/2019] insulin   Intravenous To OR  . [START ON 10/01/2019] [place] Blood Cardioplegia vial (lidocaine/magnesium/mannitol 0.26g-4g-6.4g)   Intracoronary Once  . [START ON 10/01/2019] phenylephrine  30-200 mcg/min Intravenous To OR  . [START ON 10/01/2019] potassium chloride  80 mEq Other To OR  . potassium chloride  40 mEq Oral Daily  . sacubitril-valsartan  1 tablet Oral BID  . sodium chloride flush  3 mL Intravenous Q12H  . spironolactone  12.5 mg Oral Daily  . [START ON 10/01/2019] tranexamic acid  15 mg/kg Intravenous To OR  . [START ON 10/01/2019] tranexamic acid  2 mg/kg Intracatheter To OR    Infusions: . sodium chloride    . [START ON 10/01/2019] cefUROXime (ZINACEF)  IV    . [START ON 10/01/2019] cefUROXime (ZINACEF)  IV    . [START ON 10/01/2019] dexmedetomidine    . [START ON 10/01/2019] heparin 30,000 units/NS 1000 mL solution for CELLSAVER    . [START ON 10/01/2019] milrinone    . [START ON 10/01/2019] nitroGLYCERIN    . [START ON 10/01/2019] norepinephrine    . [START ON 10/01/2019] tranexamic acid (CYKLOKAPRON) infusion ([facility])    . [START ON 10/01/2019] vancomycin      PRN Medications: sodium chloride, acetaminophen, nitroGLYCERIN, ondansetron (ZOFRAN) IV, sodium chloride flush   Assessment/Plan:   1. CAD with NSTEMI - cath with severe diffuse 3v CAD particularly on left side with severe LV dysfunction. Ideally will need CABG, however in looking at her films I worry about the quality of her LAD target and also  not sure she would benefit much from a graft to the RCA. Concerned about her mobility especially after recent fall an groin hematoma and low albumin -Had CMRI. today.   - If viable will likely need RHC prior to assess hemodynamics - continue ASA, statin, b-blocker  2. Acute systolic HF due to NICM - EF ~25% - Volume status stable.  - Stop entresto and spiro - Continue low dose bb.   3. R groin hematoma after fall - has hematoma at cath site.  - u/s negative for PSA - CT + hematoma. No mention of fracture but not completely imaged. - Hgb 14 -> 12->11.2  - off heparin.  - ON lovenox for DVT prophylaxis.  4. Hypokalemia/Hypomag - K 3.7  - continue spiro   5. Altered mental status - head CT 3/28 ok  6. Severe protein calorie malnutrition  - albumin 2.3  7. Physical deconditioning - consult PT/OT - Had acute chest pain after working with therapy.   8. Stroke, Acute  -CT 3/28 No acute findings  -CT 3/29 at 0500 R basal infact, small r pontine infact.  -Code Stroke was not activated earlier because unknown last normal.   - I called and discussed with Taurus Alamo then called  neurology--> Dr Asher Muir    Started having active chest pain after working with PT 1215.   12:16 Given SL NTG x1. EKG ST elevated V1, V2, V3, V4 changes noted  Repeat EKG 12:32  T  Wave inversion noted  V1, V2,V3, V4, V5  Dr Haroldine Laws at bedside and provided update to her son.  Additional plan per Dr Legrand Lasser/Dr Asher Muir.    Length of Stay: 3   Amy Clegg NP-C  09/30/2019, 11:37 AM  Advanced Heart Failure Team Pager [phone] (M-F; 7a - 4p)  Please contact [facility] for night-coverage after hours (4p -7a ) and weekends on [email or web address]  Called to see patient emergently for change of mental status with lethargy, fatiuge and slurred speech.   Overnight events noted with probable new CVA.   This am also developed CP when standing. ECG with new anterior ST elevation. CP now resolved. ECG with  resolution of anterior ST elevation but prominent anterior TWI.   On my exam patient lethargic but able to interact. + dysrarthria and dysconjugate gaze. Left facial droop on exam with weakness on left side and apparent + babinski in left foot.  Cardiac exam stable. Warm. No edema. Regular. Lungs clear  I paged Neurology personally and met with Dr. Cheral Marker at patient's bedside. Taken for stat head CT which showed severe intracranial atherosclerosis with out evidence for acute vessel occlusion suggestive of ischemic stroke.   Patient moved to ICU. No further CP. Still with stroke symptoms   cMRI EF 29% No LGE.   Will manage medically. Suspect ischemic cardiac symptoms related to low flow through known severe CAD and not plaque rupture.   Likely not CABG candidate with diffuse CAD and recent events.   Keep SBP >110. Continue ASA/statin. Will discuss heparin with Neuro.   CRITICAL CARE Performed by: Glori Bickers  Total critical care time: 55 minutes  Critical care time was exclusive of separately billable procedures and treating other patients.  Critical care was necessary to treat or prevent imminent or life-threatening deterioration.  Critical care was time spent personally by me (independent of midlevel providers or residents) on the following activities: development of treatment plan with patient and/or surrogate as well as nursing, discussions with consultants, evaluation of patient's response to treatment, examination of patient, obtaining history from patient or surrogate, ordering and performing treatments and interventions, ordering and review of laboratory studies, ordering and review of radiographic studies, pulse oximetry and re-evaluation of patient's condition.  Glori Bickers, MD  3:56 PM

## 2019-09-30 NOTE — Evaluation (Signed)
Physical Therapy Evaluation Patient Details Name: Brittney Kennedy MRN: [id number] DOB: October 28, 1950 Today's Date: 09/30/2019   History of Present Illness  69yo female with onset of N&V and epigastric pain intermittently for a week, admitted with NSTEMI, cath 3/26 shows severe multivessel CAD, to cone 3.27 with groin hematom.  AMS 3/28 from pain meds, RR 3/29 with AMS and R facial droop.  New CT with  R basal ganglia infarct in light of CABG being planned.  Newest EF is 29%.   PMHx:  R pontine infarct, ischemic changes on CT L putamen, aortic atherosclerosis, CAD. cholecystectomy, PUD, CHF,   Clinical Impression  Pt was seen for mobility on side of bed and standing with OT to assist, as pt is weak on L side and has sustained both L side neglect, L side visual loss and is partially aware of her need to turn her head and scan the L side.  Pt overall was able to be assisted with stepping with B HHA, and could be cued for attending to L side directional sidestepping.  Follow up will be with CIR screening to see if pt can qualify for this rehab intervention.  Focus of acute therapy will be to increase her strength and control of L side, to increase both standing and sitting balance control and to work on her awareness of her L side.    Follow Up Recommendations CIR    Equipment Recommendations  None recommended by PT    Recommendations for Other Services Rehab consult     Precautions / Restrictions Precautions Precautions: Fall Precaution Comments: monitor pulse, BP and sats Restrictions Weight Bearing Restrictions: No      Mobility  Bed Mobility Overal bed mobility: Needs Assistance Bed Mobility: Supine to Sit;Sit to Sidelying;Rolling Rolling: Mod assist   Supine to sit: +2 for physical assistance;+2 for safety/equipment;Min assist   Sit to sidelying: Max assist;+2 for physical assistance;+2 for safety/equipment General bed mobility comments: patient requires support for trunk to ascend  and scoot towards EOB, returned to supine with support for trunk and LEs due to fatigue   Transfers Overall transfer level: Needs assistance Equipment used: 2 person hand held assist Transfers: Sit to/from Stand Sit to Stand: +2 safety/equipment;+2 physical assistance;Min assist         General transfer comment: 2 Hand held assist to power up and steady from EOB; min assist +2 for side stepping to [facility]  Ambulation/Gait Ambulation/Gait assistance: Min assist;+2 physical assistance;+2 safety/equipment Gait Distance (Feet): 9 Feet Assistive device: 2 person hand held assist Gait Pattern/deviations: Decreased stride length;Step-to pattern;Wide base of support Gait velocity: slow, controlled   General Gait Details: sidesteps on side of bed with pt following with small steps as cued, prompts to get to top of bed to sit down.  Stairs            Wheelchair Mobility    Modified Rankin (Stroke Patients Only) Modified Rankin (Stroke Patients Only) Pre-Morbid Rankin Score: No symptoms Modified Rankin: Moderately severe disability     Balance Overall balance assessment: Needs assistance Sitting-balance support: No upper extremity supported;Feet supported;Single extremity supported Sitting balance-Leahy Scale: Fair Sitting balance - Comments: min assist to min guard statically   Standing balance support: Bilateral upper extremity supported;During functional activity Standing balance-Leahy Scale: Poor Standing balance comment: relaint on external support                             Pertinent Vitals/Pain Pain  Assessment: Faces Faces Pain Scale: Hurts little more Pain Location: chest pain after activity, R groin pain before Pain Descriptors / Indicators: Discomfort;Pressure;Grimacing Pain Intervention(s): Limited activity within patient's tolerance;Monitored during session(RN called )    Home Living Family/patient expects to be discharged to:: Private  residence Living Arrangements: Non-relatives/Friends Available Help at Discharge: Friend(s) Type of Home: House Home Access: Stairs to enter Entrance Stairs-Rails: None Secretary/administrator of Steps: curb Home Layout: One level Home Equipment: None      Prior Function Level of Independence: Independent         Comments: per chart pt has not been limited physically     Hand Dominance   Dominant Hand: Right    Extremity/Trunk Assessment   Upper Extremity Assessment Upper Extremity Assessment: Defer to OT evaluation LUE Deficits / Details: grossly 3-/5, decreased coordination  LUE Sensation: (TBD) LUE Coordination: decreased fine motor;decreased gross motor    Lower Extremity Assessment Lower Extremity Assessment: LLE deficits/detail LLE Deficits / Details: weakness and struggle to follow commands on L ankle esp LLE Coordination: decreased gross motor;decreased fine motor    Cervical / Trunk Assessment Cervical / Trunk Assessment: Kyphotic(milf)  Communication   Communication: Expressive difficulties(slurred speech)  Cognition Arousal/Alertness: Lethargic Behavior During Therapy: Flat affect Overall Cognitive Status: Difficult to assess Area of Impairment: Following commands;Problem solving;Awareness;Attention                   Current Attention Level: Focused   Following Commands: Follows one step commands with increased time;Follows multi-step commands inconsistently   Awareness: Emergent Problem Solving: Slow processing;Decreased initiation;Difficulty sequencing;Requires verbal cues General Comments: with increased time pt able to follow commands, pt difficult to undersatnd at times due to slurred speech; decreased problem solving       General Comments General comments (skin integrity, edema, etc.): pt VSS during session (supine BO 114/61 HR 76; EOB 127/60 HR 81; max HR standing 90). Once supine patient reports chest pain, RN notified and present  immediately    Exercises     Assessment/Plan    PT Assessment Patient needs continued PT services  PT Problem List Decreased strength;Decreased range of motion;Decreased activity tolerance;Decreased balance;Decreased mobility;Decreased coordination;Decreased knowledge of use of DME;Cardiopulmonary status limiting activity;Pain;Decreased skin integrity       PT Treatment Interventions DME instruction;Gait training;Functional mobility training;Stair training;Therapeutic activities;Therapeutic exercise;Balance training;Neuromuscular re-education;Patient/family education    PT Goals (Current goals can be found in the Care Plan section)  Acute Rehab PT Goals Patient Stated Goal: to speak better  PT Goal Formulation: With patient/family Time For Goal Achievement: 10/14/19 Potential to Achieve Goals: Good    Frequency Min 3X/week   Barriers to discharge Inaccessible home environment;Decreased caregiver support home with a friend who receives care from pt    Co-evaluation PT/OT/SLP Co-Evaluation/Treatment: Yes Reason for Co-Treatment: For patient/therapist safety;To address functional/ADL transfers PT goals addressed during session: Mobility/safety with mobility;Balance OT goals addressed during session: ADL's and self-care       AM-PAC PT "6 Clicks" Mobility  Outcome Measure Help needed turning from your back to your side while in a flat bed without using bedrails?: A Lot Help needed moving from lying on your back to sitting on the side of a flat bed without using bedrails?: A Lot Help needed moving to and from a bed to a chair (including a wheelchair)?: A Lot Help needed standing up from a chair using your arms (e.g., wheelchair or bedside chair)?: A Little Help needed to walk in hospital room?: A  Lot Help needed climbing 3-5 steps with a railing? : Total 6 Click Score: 12    End of Session Equipment Utilized During Treatment: Oxygen Activity Tolerance: Patient limited by  fatigue;Treatment limited secondary to medical complications (Comment) Patient left: in bed;with call bell/phone within reach Nurse Communication: Mobility status;Other (comment)(called nurse for chest pain complaint) PT Visit Diagnosis: Unsteadiness on feet (R26.81);Muscle weakness (generalized) (M62.81);Difficulty in walking, not elsewhere classified (R26.2);Hemiplegia and hemiparesis Hemiplegia - Right/Left: Left Hemiplegia - dominant/non-dominant: Non-dominant Hemiplegia - caused by: Cerebral infarction    Time: 1674-2552 PT Time Calculation (min) (ACUTE ONLY): 25 min   Charges:   PT Evaluation $PT Eval Moderate Complexity: 1 Mod PT Treatments $Therapeutic Activity: 8-22 mins       Ivar Drape 09/30/2019, 2:08 PM   Samul Dada, PT MS Acute Rehab Dept. Number: [facility] [id number] and [facility] [phone]

## 2019-09-30 NOTE — Progress Notes (Addendum)
Advanced Heart Failure Rounding Note   Subjective:    Events  - cath 09/27/19 3v CAD. EF 20% - right groin hematoma. U/s 3/27 negative for PSA - 3/28 altered mental status due to pain meds. Head CT, ABG and ammonia normal   Had CMRI today.   Earlier this morning 0500 rapid response was called due to AMS and left facial droop/left sided weakness CT of head completed 0459 am. Arby [facility]rette APP notified of change and orders received. CT findings communicated to C. Bodenheimer APP.    Drowsy but arousable.     Objective:   Weight Range:  Vital Signs:   Temp:  [97.7 F (36.5 C)-98.5 F (36.9 C)] 98.5 F (36.9 C) (03/29 0421) Pulse Rate:  [84-96] 96 (03/29 0834) Resp:  [17-23] 20 (03/29 0421) BP: (115-137)/(49-78) 137/78 (03/29 0834) SpO2:  [95 %-100 %] 99 % (03/29 0421) Weight:  [72.9 kg] 72.9 kg (03/29 0421) Last BM Date: 09/26/19  Weight change: Filed Weights   09/28/19 0407 09/29/19 0533 09/30/19 0421  Weight: 74.2 kg 75.3 kg 72.9 kg    Intake/Output:   Intake/Output Summary (Last 24 hours) at 09/30/2019 1137 Last data filed at 09/30/2019 0005 Gross per 24 hour  Intake 123 ml  Output 300 ml  Net -177 ml     Physical Exam: General:  Drowsy. No resp difficulty HEENT: normal Neck: supple. no JVD. Carotids 2+ bilat; no bruits. No lymphadenopathy or thryomegaly appreciated. Cor: PMI nondisplaced. Regular rate & rhythm. No rubs, gallops or murmurs. Lungs: clear was on room air.  Abdomen: soft, nontender, nondistended. No hepatosplenomegaly. No bruits or masses. Good bowel sounds. Extremities: no cyanosis, clubbing, rash, edema. R groin hematoma.  Neuro: Left sided facial droop. Drowsy. Alert & orientedx2. Moves all 4 extremities. LUE weaker.  Affect flat. Speech garbled.    Telemetry: NSR 70-80s   Labs: Basic Metabolic Panel: Recent Labs  Lab 09/26/19 1647 09/26/19 1647 09/27/19 0428 09/27/19 0428 09/28/19 0408 09/29/19 0526 09/30/19 0351  NA  139  --  143  --  142 140 141  K 3.0*  --  3.5  --  3.2* 3.5 3.7  CL 105  --  113*  --  106 107 107  CO2 18*  --  16*  --  16* 21* 20*  GLUCOSE 128*  --  112*  --  107* 150* 161*  BUN 10  --  10  --  10 14 12   CREATININE 0.77  --  0.71  --  0.83 0.75 0.90  CALCIUM 9.2   < > 8.6*   < > 8.9 8.7* 8.8*  MG  --   --   --   --  1.4* 2.4  --    < > = values in this interval not displayed.    Liver Function Tests: Recent Labs  Lab 09/26/19 1647 09/29/19 1112  AST 68* 40  ALT 30 19  ALKPHOS 85 75  BILITOT 1.2 1.0  PROT 6.3* 4.9*  ALBUMIN 3.2* 2.3*   Recent Labs  Lab 09/26/19 1647  LIPASE 25   Recent Labs  Lab 09/29/19 1112  AMMONIA 16    CBC: Recent Labs  Lab 09/26/19 1647 09/27/19 0428 09/28/19 0408 09/29/19 0526 09/30/19 0351  WBC 4.1 5.1 10.3 9.6 8.1  NEUTROABS  --  2.9 6.6  --   --   HGB 15.3* 13.3 14.5 11.7* 11.2*  HCT 44.8 39.7 43.5 35.0* 33.5*  MCV 92.8 94.5 94.2 94.1 94.6  PLT 229 214 240 262 234    Cardiac Enzymes: No results for input(s): CKTOTAL, CKMB, CKMBINDEX, TROPONINI in the last 168 hours.  BNP: BNP (last 3 results) No results for input(s): BNP in the last 8760 hours.  ProBNP (last 3 results) No results for input(s): PROBNP in the last 8760 hours.    Other results:  Imaging: CT ABDOMEN PELVIS WO CONTRAST  Result Date: 09/28/2019 CLINICAL DATA:  Thoracic aortic disease. Pre operative planning for CABG. EXAM: CT CHEST, ABDOMEN AND PELVIS WITHOUT CONTRAST TECHNIQUE: Multidetector CT imaging of the chest, abdomen and pelvis was performed following the standard protocol without IV contrast. COMPARISON:  CT scan of the abdomen dated 09/26/2019 FINDINGS: CT CHEST FINDINGS Cardiovascular: Aortic atherosclerosis without dilatation of the thoracic aorta. Coronary artery calcifications. Heart is at the upper limits of normal in size. Small pericardial effusion. Mediastinum/Nodes: 9 mm nodule in the right lobe of the thyroid gland. Trachea is normal.  Esophagus is normal. Lungs/Pleura: Small bilateral pleural effusions minimal secondary atelectasis at the bases. Lungs are otherwise clear. Musculoskeletal: No chest wall mass or suspicious bone lesions identified. CT ABDOMEN PELVIS FINDINGS Hepatobiliary: Slight hepatic steatosis. Cholecystectomy. No biliary ductal dilatation. Pancreas: Normal. Spleen: Normal. Adrenals/Urinary Tract: Adrenal glands are normal. There are persistent nephrograms bilaterally without hydronephrosis. Tiny area of linear enhancement in the medial aspect of the upper pole is not felt to be significant. Stomach/Bowel: Stomach is within normal limits. Appendix appears normal. No evidence of bowel wall thickening, distention, or inflammatory changes. There are a few diverticula in the left side of the colon. No diverticulitis. Vascular/Lymphatic: Aortic atherosclerosis. No enlarged abdominal or pelvic lymph nodes. Reproductive: Uterus and ovaries appear normal. Patient's history reports abdominal hysterectomy but that does not appear to be accurate. Other: No abdominal wall hernia or abnormality. No abdominopelvic ascites. Musculoskeletal: There is an incompletely visualized 5 cm intramuscular hematoma in the right rectus femoris muscle. There is slight hemorrhage in the adjacent adductor longus muscle without a defined hematoma. IMPRESSION: 1. Aortic atherosclerosis.  No aneurysmal dilatation or dissection. 2. Incompletely visualized 5 cm intramuscular hematoma in the right rectus femoris muscle. Slight hemorrhage in the adjacent adductor longus muscle without a defined hematoma. 3. Persistent nephrograms bilaterally which could represent acute tubular necrosis. 4. Small bilateral pleural effusions with minimal secondary atelectasis at the bases. 5. Slight hepatic steatosis. 6. 9 mm nodule in the right lobe of the thyroid gland. No follow-up recommended. This recommendation follows ACR consensus guidelines: Managing Incidental Thyroid  Nodules Detected on Imaging: White Paper of the ACR Incidental Thyroid Findings Committee. J Am Coll Radiol 2015; 12:143-150. Aortic Atherosclerosis (ICD10-I70.0). Electronically Signed   By: Lorriane Shire M.D.   On: 09/28/2019 17:01   CT HEAD WO CONTRAST  Result Date: 09/30/2019 CLINICAL DATA:  Change in mental status with left arm weakness EXAM: CT HEAD WITHOUT CONTRAST TECHNIQUE: Contiguous axial images were obtained from the base of the skull through the vertex without intravenous contrast. COMPARISON:  Head CT from yesterday FINDINGS: Brain: Low-density in the right caudate head and anterior body, also involving the upper right putamen. There is also indistinct appearance to the left putamen. Low-density in the right pons with fairly defined and flat appearance on reformats. No visible cortex infarct. No hemorrhage, hydrocephalus, or masslike finding. Vascular: No hyperdense vessel Skull: Negative Sinuses/Orbits: Anterior septal defect without nodularity. Other: These results will be called to the ordering clinician or representative by the Radiologist Assistant, and communication documented in the PACS or [employer].  IMPRESSION: 1. Right basal ganglia infarct, likely recent given the history. 2. Ischemic change also seen at the left putamen, age indeterminate by imaging. 3. Small remote appearing right pontine infarct. Electronically Signed   By: Monte Fantasia M.D.   On: 09/30/2019 04:59   CT HEAD WO CONTRAST  Result Date: 09/29/2019 CLINICAL DATA:  Altered mental status EXAM: CT HEAD WITHOUT CONTRAST TECHNIQUE: Contiguous axial images were obtained from the base of the skull through the vertex without intravenous contrast. COMPARISON:  None. FINDINGS: Brain: No evidence of acute infarction, hemorrhage, extra-axial collection, ventriculomegaly, or mass effect. Generalized cerebral atrophy. Periventricular white matter low attenuation likely secondary to microangiopathy. Vascular:  Cerebrovascular atherosclerotic calcifications are noted. Skull: Negative for fracture or focal lesion. Sinuses/Orbits: Visualized portions of the orbits are unremarkable. Visualized portions of the paranasal sinuses are unremarkable. Visualized portions of the mastoid air cells are unremarkable. Other: None. IMPRESSION: 1. No acute intracranial pathology. 2. Chronic microvascular disease and cerebral atrophy. Electronically Signed   By: Kathreen Devoid   On: 09/29/2019 10:45   CT CHEST WO CONTRAST  Result Date: 09/28/2019 CLINICAL DATA:  Thoracic aortic disease. Pre operative planning for CABG. EXAM: CT CHEST, ABDOMEN AND PELVIS WITHOUT CONTRAST TECHNIQUE: Multidetector CT imaging of the chest, abdomen and pelvis was performed following the standard protocol without IV contrast. COMPARISON:  CT scan of the abdomen dated 09/26/2019 FINDINGS: CT CHEST FINDINGS Cardiovascular: Aortic atherosclerosis without dilatation of the thoracic aorta. Coronary artery calcifications. Heart is at the upper limits of normal in size. Small pericardial effusion. Mediastinum/Nodes: 9 mm nodule in the right lobe of the thyroid gland. Trachea is normal. Esophagus is normal. Lungs/Pleura: Small bilateral pleural effusions minimal secondary atelectasis at the bases. Lungs are otherwise clear. Musculoskeletal: No chest wall mass or suspicious bone lesions identified. CT ABDOMEN PELVIS FINDINGS Hepatobiliary: Slight hepatic steatosis. Cholecystectomy. No biliary ductal dilatation. Pancreas: Normal. Spleen: Normal. Adrenals/Urinary Tract: Adrenal glands are normal. There are persistent nephrograms bilaterally without hydronephrosis. Tiny area of linear enhancement in the medial aspect of the upper pole is not felt to be significant. Stomach/Bowel: Stomach is within normal limits. Appendix appears normal. No evidence of bowel wall thickening, distention, or inflammatory changes. There are a few diverticula in the left side of the colon. No  diverticulitis. Vascular/Lymphatic: Aortic atherosclerosis. No enlarged abdominal or pelvic lymph nodes. Reproductive: Uterus and ovaries appear normal. Patient's history reports abdominal hysterectomy but that does not appear to be accurate. Other: No abdominal wall hernia or abnormality. No abdominopelvic ascites. Musculoskeletal: There is an incompletely visualized 5 cm intramuscular hematoma in the right rectus femoris muscle. There is slight hemorrhage in the adjacent adductor longus muscle without a defined hematoma. IMPRESSION: 1. Aortic atherosclerosis.  No aneurysmal dilatation or dissection. 2. Incompletely visualized 5 cm intramuscular hematoma in the right rectus femoris muscle. Slight hemorrhage in the adjacent adductor longus muscle without a defined hematoma. 3. Persistent nephrograms bilaterally which could represent acute tubular necrosis. 4. Small bilateral pleural effusions with minimal secondary atelectasis at the bases. 5. Slight hepatic steatosis. 6. 9 mm nodule in the right lobe of the thyroid gland. No follow-up recommended. This recommendation follows ACR consensus guidelines: Managing Incidental Thyroid Nodules Detected on Imaging: White Paper of the ACR Incidental Thyroid Findings Committee. J Am Coll Radiol 2015; 12:143-150. Aortic Atherosclerosis (ICD10-I70.0). Electronically Signed   By: Lorriane Shire M.D.   On: 09/28/2019 17:01   VAS [place] GROIN PSEUDOANEURYSM  Result Date: 09/29/2019  ARTERIAL PSEUDOANEURYSM  Exam: Right groin Indications:  Patient complains of palpable knot and bruising. History: S/p catheterization. Comparison Study: No prior study Performing Technologist: Sharion Dove RVS  Examination Guidelines: A complete evaluation includes B-mode imaging, spectral Doppler, color Doppler, and power Doppler as needed of all accessible portions of each vessel. Bilateral testing is considered an integral part of a complete examination. Limited examinations for reoccurring  indications may be performed as noted.  Summary: No evidence of pseudoaneurysm, AVF or DVT Large hematoma noted in the proximal thigh measuring 4.46 X 4.65. No evidence of pseudoaneurysm or AV Fistula. Diagnosing physician: Deitra Mayo MD Electronically signed by Deitra Mayo MD on 09/29/2019 at 6:36:42 PM.   --------------------------------------------------------------------------------    Final    VAS US DOPPLER PRE CABG  Result Date: 09/29/2019 PREOPERATIVE VASCULAR EVALUATION  Indications:      Pre-CABG. Risk Factors:     Coronary artery disease. Other Factors:    Heart failure. Limitations:      Arrythmia Comparison Study: No prior study Performing Technologist: Sharion Dove RVS  Examination Guidelines: A complete evaluation includes B-mode imaging, spectral Doppler, color Doppler, and power Doppler as needed of all accessible portions of each vessel. Bilateral testing is considered an integral part of a complete examination. Limited examinations for reoccurring indications may be performed as noted.  Right Carotid Findings: +----------+--------+--------+--------+------------+------------------+           PSV cm/sEDV cm/sStenosisDescribe    Comments           +----------+--------+--------+--------+------------+------------------+ CCA Prox  70      24                          intimal thickening +----------+--------+--------+--------+------------+------------------+ CCA Distal79      30                          intimal thickening +----------+--------+--------+--------+------------+------------------+ ICA Prox  75      34              heterogenousShadowing          +----------+--------+--------+--------+------------+------------------+ ICA Distal76      32                                             +----------+--------+--------+--------+------------+------------------+ ECA       77      16                                              +----------+--------+--------+--------+------------+------------------+ Portions of this table do not appear on this page. +----------+--------+-------+--------+------------+           PSV cm/sEDV cmsDescribeArm Pressure +----------+--------+-------+--------+------------+ [id number]                                  +----------+--------+-------+--------+------------+ +---------+--------+--+--------+--+ VertebralPSV cm/s52EDV cm/s17 +---------+--------+--+--------+--+ Left Carotid Findings: +----------+--------+--------+--------+--------+------------------+           PSV cm/sEDV cm/sStenosisDescribeComments           +----------+--------+--------+--------+--------+------------------+ CCA Prox  73      23                      intimal thickening +----------+--------+--------+--------+--------+------------------+  CCA Distal76      27                      intimal thickening +----------+--------+--------+--------+--------+------------------+ ICA Prox  65      32                                         +----------+--------+--------+--------+--------+------------------+ ICA Distal102     40                                         +----------+--------+--------+--------+--------+------------------+ ECA       104     17                                         +----------+--------+--------+--------+--------+------------------+ +----------+--------+--------+--------+------------+ SubclavianPSV cm/sEDV cm/sDescribeArm Pressure +----------+--------+--------+--------+------------+           49                      137          +----------+--------+--------+--------+------------+  ABI Findings: +--------+------------------+-----+---------+--------+ Right   Rt Pressure (mmHg)IndexWaveform Comment  +--------+------------------+-----+---------+--------+ Brachial                       triphasicIV        +--------+------------------+-----+---------+--------+ +--------+------------------+-----+---------+-------+ Left    Lt Pressure (mmHg)IndexWaveform Comment +--------+------------------+-----+---------+-------+ [id number]                    triphasic        +--------+------------------+-----+---------+-------+  Right Doppler Findings: +--------+--------+-----+---------+--------+ Site    PressureIndexDoppler  Comments +--------+--------+-----+---------+--------+ Brachial             triphasicIV       +--------+--------+-----+---------+--------+ Radial               triphasic         +--------+--------+-----+---------+--------+ Ulnar                triphasic         +--------+--------+-----+---------+--------+  Left Doppler Findings: +--------+--------+-----+---------+--------+ Site    PressureIndexDoppler  Comments +--------+--------+-----+---------+--------+ [id number]          triphasic         +--------+--------+-----+---------+--------+ Radial               triphasic         +--------+--------+-----+---------+--------+ Ulnar                triphasic         +--------+--------+-----+---------+--------+  Summary: Right Carotid: The extracranial vessels were near-normal with only minimal wall                thickening or plaque. Left Carotid: The extracranial vessels were near-normal with only minimal wall               thickening or plaque. Vertebrals:  Bilateral vertebral arteries demonstrate antegrade flow. Subclavians: Normal flow hemodynamics were seen in bilateral subclavian              arteries. Right Upper Extremity: Doppler waveforms remain within normal limits with right radial compression. Doppler waveforms remain within normal limits with right ulnar compression.  Left Upper Extremity: Doppler waveforms remain within normal limits with left radial compression. Doppler waveform obliterate with left ulnar compression.  Electronically signed by  Deitra Mayo MD on 09/29/2019 at 6:36:27 PM.    Final      Medications:     Scheduled Medications: . aspirin EC  81 mg Oral Daily  . atorvastatin  80 mg Oral q1800  . carvedilol  3.125 mg Oral BID WC  . digoxin  0.125 mg Oral Daily  . enoxaparin (LOVENOX) injection  40 mg Subcutaneous Q24H  . [START ON 10/01/2019] epinephrine  0-10 mcg/min Intravenous To OR  . [START ON 10/01/2019] heparin-papaverine-plasmalyte irrigation   Irrigation To OR  . [START ON 10/01/2019] insulin   Intravenous To OR  . [START ON 10/01/2019] [place] Blood Cardioplegia vial (lidocaine/magnesium/mannitol 0.26g-4g-6.4g)   Intracoronary Once  . [START ON 10/01/2019] phenylephrine  30-200 mcg/min Intravenous To OR  . [START ON 10/01/2019] potassium chloride  80 mEq Other To OR  . potassium chloride  40 mEq Oral Daily  . sacubitril-valsartan  1 tablet Oral BID  . sodium chloride flush  3 mL Intravenous Q12H  . spironolactone  12.5 mg Oral Daily  . [START ON 10/01/2019] tranexamic acid  15 mg/kg Intravenous To OR  . [START ON 10/01/2019] tranexamic acid  2 mg/kg Intracatheter To OR    Infusions: . sodium chloride    . [START ON 10/01/2019] cefUROXime (ZINACEF)  IV    . [START ON 10/01/2019] cefUROXime (ZINACEF)  IV    . [START ON 10/01/2019] dexmedetomidine    . [START ON 10/01/2019] heparin 30,000 units/NS 1000 mL solution for CELLSAVER    . [START ON 10/01/2019] milrinone    . [START ON 10/01/2019] nitroGLYCERIN    . [START ON 10/01/2019] norepinephrine    . [START ON 10/01/2019] tranexamic acid (CYKLOKAPRON) infusion ([facility])    . [START ON 10/01/2019] vancomycin      PRN Medications: sodium chloride, acetaminophen, nitroGLYCERIN, ondansetron (ZOFRAN) IV, sodium chloride flush   Assessment/Plan:   1. CAD with NSTEMI - cath with severe diffuse 3v CAD particularly on left side with severe LV dysfunction. Ideally will need CABG, however in looking at her films I worry about the quality of her LAD target and also  not sure she would benefit much from a graft to the RCA. Concerned about her mobility especially after recent fall an groin hematoma and low albumin -Had CMRI. today.   - If viable will likely need RHC prior to assess hemodynamics - continue ASA, statin, b-blocker  2. Acute systolic HF due to NICM - EF ~25% - Volume status stable.  - Stop entresto and spiro - Continue low dose bb.   3. R groin hematoma after fall - has hematoma at cath site.  - u/s negative for PSA - CT + hematoma. No mention of fracture but not completely imaged. - Hgb 14 -> 12->11.2  - off heparin.  - ON lovenox for DVT prophylaxis.  4. Hypokalemia/Hypomag - K 3.7  - continue spiro   5. Altered mental status - head CT 3/28 ok  6. Severe protein calorie malnutrition  - albumin 2.3  7. Physical deconditioning - consult PT/OT - Had acute chest pain after working with therapy.   8. Stroke, Acute  -CT 3/28 No acute findings  -CT 3/29 at 0500 R basal infact, small r pontine infact.  -Code Stroke was not activated earlier because unknown last normal.   - I called and discussed with Skylar Priest then called  neurology--> Dr Asher Muir    Started having active chest pain after working with PT 1215.   12:16 Given SL NTG x1. EKG ST elevated V1, V2, V3, V4 changes noted  Repeat EKG 12:32  T  Wave inversion noted  V1, V2,V3, V4, V5  Dr Haroldine Laws at bedside and provided update to her son.  Additional plan per Dr Lexianna Weinrich/Dr Asher Muir.    Length of Stay: 3   Amy Clegg NP-C  09/30/2019, 11:37 AM  Advanced Heart Failure Team Pager [phone] (M-F; 7a - 4p)  Please contact [facility] for night-coverage after hours (4p -7a ) and weekends on [email or web address]  Called to see patient emergently for change of mental status with lethargy, fatiuge and slurred speech.   Overnight events noted with probable new CVA.   This am also developed CP when standing. ECG with new anterior ST elevation. CP now resolved. ECG with  resolution of anterior ST elevation but prominent anterior TWI.   On my exam patient lethargic but able to interact. + dysrarthria and dysconjugate gaze. Left facial droop on exam with weakness on left side and apparent + babinski in left foot.  Cardiac exam stable. Warm. No edema. Regular. Lungs clear  I paged Neurology personally and met with Dr. Cheral Marker at patient's bedside. Taken for stat head CT which showed severe intracranial atherosclerosis with out evidence for acute vessel occlusion suggestive of ischemic stroke.   Patient moved to ICU. No further CP. Still with stroke symptoms   cMRI EF 29% No LGE.   Will manage medically. Suspect ischemic cardiac symptoms related to low flow through known severe CAD and not plaque rupture.   Likely not CABG candidate with diffuse CAD and recent events.   Keep SBP >110. Continue ASA/statin. Will discuss heparin with Neuro.   CRITICAL CARE Performed by: Glori Bickers  Total critical care time: 55 minutes  Critical care time was exclusive of separately billable procedures and treating other patients.  Critical care was necessary to treat or prevent imminent or life-threatening deterioration.  Critical care was time spent personally by me (independent of midlevel providers or residents) on the following activities: development of treatment plan with patient and/or surrogate as well as nursing, discussions with consultants, evaluation of patient's response to treatment, examination of patient, obtaining history from patient or surrogate, ordering and performing treatments and interventions, ordering and review of laboratory studies, ordering and review of radiographic studies, pulse oximetry and re-evaluation of patient's condition.  Glori Bickers, MD  3:56 PM

## 2019-09-30 NOTE — Evaluation (Addendum)
Occupational Therapy Evaluation Patient Details Name: Brittney Kennedy MRN: [id number] DOB: 08-Jan-1951 Today's Date: 09/30/2019    History of Present Illness Pt is a 69 y/o female with hx of CAD, cholecystectomy, presented to ED with nausea/vomiting, epigastric pain for 1 week intermittently.  Admitted for NSTEMI, cath 3/26 revealed severee multivessel CAD and transferred to [facility].  Fall 3/27 with R groin hematoma.  Found AMS 3/28 due to pain meds.  Rapid repsonse 3/29 due to AMS and L facial droop/weakness. CT reveals "Right basal ganglia infarct".    Clinical Impression   PTA patient independent. Admitted for above and limited by problem list below, including L sided weakness, impaired balance, L visual inattention/loss, L inattention, decreased activity tolerance, and impaired cognition.  Pt oriented and cooperative, follows simple commands with increased time and difficulty following multiple step commands, attending to tasks. She has slurred speech and at times is difficult to understand. Patient requires min assist +2 for transfers, min assist +2 for side stepping at EOB, and min-max assist for ADLs. She presents with R gaze preference, difficulty scanning past midline--further assessment recommended.  She fatigues easily and once supine, reports chest pain-- RN notified and present to assess. Son present and supportive throughout session. She will benefit from further OT services while admitted and after dc at CIR level to optimize independence and safety with ADLs, IADLs and mobility. Will follow acutely.     Follow Up Recommendations  CIR;Supervision/Assistance - 24 hour    Equipment Recommendations  Other (comment)(TBD at next venue of care)    Recommendations for Other Services       Precautions / Restrictions Precautions Precautions: Fall Restrictions Weight Bearing Restrictions: No      Mobility Bed Mobility Overal bed mobility: Needs Assistance Bed Mobility: Supine to Sit;Sit  to Sidelying;Rolling Rolling: Mod assist   Supine to sit: +2 for physical assistance;+2 for safety/equipment;Min assist   Sit to sidelying: Max assist;+2 for physical assistance;+2 for safety/equipment General bed mobility comments: patient requires support for trunk to ascend and scoot towards EOB, returned to supine with support for trunk and LEs due to fatigue   Transfers Overall transfer level: Needs assistance Equipment used: 2 person hand held assist Transfers: Sit to/from Stand Sit to Stand: +2 safety/equipment;+2 physical assistance;Min assist         General transfer comment: 2 Hand held assist to power up and steady from EOB; min assist +2 for side stepping to [facility]    Balance Overall balance assessment: Needs assistance Sitting-balance support: No upper extremity supported;Feet supported;Single extremity supported Sitting balance-Leahy Scale: Fair Sitting balance - Comments: min assist to min guard statically   Standing balance support: Bilateral upper extremity supported;During functional activity Standing balance-Leahy Scale: Poor Standing balance comment: relaint on external support                           ADL either performed or assessed with clinical judgement   ADL Overall ADL's : Needs assistance/impaired     Grooming: Sitting;Moderate assistance   Upper Body Bathing: Sitting;Moderate assistance   Lower Body Bathing: Sit to/from stand;Maximal assistance;+2 for physical assistance   Upper Body Dressing : Moderate assistance;Sitting   Lower Body Dressing: Maximal assistance;Sit to/from stand;+2 for physical assistance     Toilet Transfer Details (indicate cue type and reason): simulated side stepping to [facility] with min assist +2         Functional mobility during ADLs: Minimal assistance;Moderate assistance;+2 for safety/equipment;+2  for physical assistance General ADL Comments: pt limited by L sided weakness, decreased activity tolerance  and impaired balance     Vision Baseline Vision/History: Wears glasses Wears Glasses: At all times Patient Visual Report: No change from baseline Vision Assessment?: Yes Ocular Range of Motion: Restricted on the left Alignment/Gaze Preference: Gaze right Tracking/Visual Pursuits: (unable to track past midline ) Visual Fields: Left visual field deficit Additional Comments: difficult to assesss due to cognition and following mulitple step commands, but appears to have deficits in L visual field      Perception     Praxis      Pertinent Vitals/Pain Pain Assessment: Faces Faces Pain Scale: Hurts little more Pain Location: chest pain after activity (no pain before)  Pain Descriptors / Indicators: Discomfort;Pressure Pain Intervention(s): Limited activity within patient's tolerance;Monitored during session(RN notified and present )     Hand Dominance Right   Extremity/Trunk Assessment Upper Extremity Assessment Upper Extremity Assessment: LUE deficits/detail LUE Deficits / Details: grossly 3-/5, decreased coordination  LUE Sensation: (TBD) LUE Coordination: decreased fine motor;decreased gross motor   Lower Extremity Assessment Lower Extremity Assessment: Defer to PT evaluation       Communication Communication Communication: Expressive difficulties(slurred speech)   Cognition Arousal/Alertness: Lethargic Behavior During Therapy: Flat affect Overall Cognitive Status: Difficult to assess Area of Impairment: Following commands;Problem solving;Awareness;Attention                   Current Attention Level: Focused   Following Commands: Follows one step commands with increased time;Follows multi-step commands inconsistently   Awareness: Emergent Problem Solving: Slow processing;Decreased initiation;Difficulty sequencing;Requires verbal cues General Comments: with increased time pt able to follow commands, pt difficult to undersatnd at times due to slurred speech;  decreased problem solving    General Comments  pt VSS during session (supine BO 114/61 HR 76; EOB 127/60 HR 81; max HR standing 90). Once supine patient reports chest pain, RN notified and present immediately    Exercises     Shoulder Instructions      Home Living Family/patient expects to be discharged to:: Private residence Living Arrangements: Non-relatives/Friends Available Help at Discharge: Friend(s) Type of Home: House Home Access: Stairs to enter [employer] of Steps: curb   Home Layout: One level     Bathroom Shower/Tub: Occupational psychologist: Standard     Home Equipment: None          Prior Functioning/Environment Level of Independence: Independent                 OT Problem List: Decreased strength;Decreased activity tolerance;Pain;Decreased coordination;Decreased cognition;Impaired balance (sitting and/or standing);Impaired vision/perception;Decreased safety awareness;Decreased knowledge of use of DME or AE;Decreased knowledge of precautions;Cardiopulmonary status limiting activity      OT Treatment/Interventions: Self-care/ADL training;DME and/or AE instruction;Therapeutic exercise;Neuromuscular education;Balance training;Cognitive remediation/compensation;Therapeutic activities;Patient/family education;Visual/perceptual remediation/compensation    OT Goals(Current goals can be found in the care plan section) Acute Rehab OT Goals Patient Stated Goal: to speak better  OT Goal Formulation: With patient Time For Goal Achievement: 10/14/19 Potential to Achieve Goals: Good  OT Frequency: Min 2X/week   Barriers to D/C:            Co-evaluation PT/OT/SLP Co-Evaluation/Treatment: Yes Reason for Co-Treatment: For patient/therapist safety;To address functional/ADL transfers;Necessary to address cognition/behavior during functional activity   OT goals addressed during session: ADL's and self-care      AM-PAC OT "6 Clicks" Daily  Activity     Outcome Measure Help from another person  eating meals?: Total Help from another person taking care of personal grooming?: A Lot Help from another person toileting, which includes using toliet, bedpan, or urinal?: A Lot Help from another person bathing (including washing, rinsing, drying)?: A Lot Help from another person to put on and taking off regular upper body clothing?: A Little Help from another person to put on and taking off regular lower body clothing?: A Lot 6 Click Score: 12   End of Session Nurse Communication: Mobility status;Other (comment)(chest pain )  Activity Tolerance: Patient limited by lethargy Patient left: in bed;with call bell/phone within reach;with bed alarm set;with nursing/sitter in room;with family/visitor present  OT Visit Diagnosis: Other abnormalities of gait and mobility (R26.89);Muscle weakness (generalized) (M62.81);Pain;History of falling (Z91.81);Cognitive communication deficit (R41.841);Other symptoms and signs involving the nervous system (R29.898);Low vision, both eyes (H54.2) Pain - part of body: (chest)                Time: 4832-3468 OT Time Calculation (min): 25 min Charges:  OT General Charges $OT Visit: 1 Visit OT Evaluation $OT Eval Moderate Complexity: 1 Mod  Barry Brunner, OT Acute Rehabilitation Services Pager [phone] Office [phone]   Chancy Milroy 09/30/2019, 1:25 PM

## 2019-09-30 NOTE — Significant Event (Addendum)
Rapid Response Event Note  Overview: AMS LSW: Unknown, pt with dysarthria and lethargy since 3/28 am  (Code Stroke was not initiated due to unknown LSW and no LVO symptoms)  Initial Focused Assessment: Notified by nursing staff regarding new Left droop and left arm weakness in addition to the continued dysarthria and lethargy. No narcotics given since Oxycodone dc'd. Upon my arrival, Ms. Hochstein was lethargic, new left droop and Left arm weakness. CBG 157.  Stevie Kern APP notified of change and orders received. CT findings communicated to C. Bodenheimer APP.  NIHSS 1a  Level of consciousness: 1  1b. LOC questions:  0  1c. LOC commands: 0  2.  Best Gaze: 0  3.  Visual: 0  4. Facial Palsy:1  5a.  Motor left arm: 1  5b.  Motor right arm: 0  6a. motor left leg: 0  6b  Motor right leg: 0  7. Limb Ataxia: 0  8.  Sensory: 0  9. Best Language: 0  10. Dysarthria:1  11. Extinction and Inattention: 0   Total: 4   Also upon return from CT, pt c/o right sided chest pressure. SL NTG x1 given and pain resolved. 12 lead EKG done with no acute STE. Troponins ordered.   Interventions: -Stat CT head - 1. Right basal ganglia infarct, likely recent given the history. 2. Ischemic change also seen at the left putamen, age indeterminate by imaging. 3. Small remote appearing right pontine infarct.   Plan of Care (if not transferred): -Monitor neuro status with VS checks -Notify primary service and/or RRRN for further assistance  Event Summary: Call received 0348 Arrived at call [phone] Call ended [phone]  Rose Fillers

## 2019-09-30 NOTE — Progress Notes (Signed)
OT Cancellation Note  Patient Details Name: Cambre Matson MRN: [id number] DOB: 1951-01-31   Cancelled Treatment:    Reason Eval/Treat Not Completed: Patient at procedure or test/ unavailable- pt off unit at MRI, will follow and see as able.   Barry Brunner, OT Acute Rehabilitation Services Pager [phone] Office [phone]   Chancy Milroy 09/30/2019, 10:22 AM

## 2019-09-30 NOTE — Code Documentation (Signed)
69 yo female with hx of CAD, NSTEMI, and left Heart Cath on 3/26 coming from [address] where her LKW is unclear. Pt had been acting lethargic with slowed speech since surgery. Pt had been given opioids for pain and lethargy was attributed to this.   On 3/28,  Pt was asked to be evaluated by Rapid Response due to continued drowsiness after medication had not been given. Noted to have no weakness and only some lethargy and slurred speech. It continued throughout the day. At 2000, the son left for the night and patient was to be at the baseline of lethargic with some "slow speech." Per RRT Note, Pt was evaluated on 3/29 at 0408, where she was noted to have some left facial droop, left upper arm weakness, dysarthria, and continued lethargy. Repeat CT was completed, but no LVO noted.   At 1300, Code Stroke was called after patient was noted to prefer gazing right and continued to have left arm weakness, left facial droop, dysarthria, and mild aphasia. Pt taken down for CT. CT/CTA completed. CTA showed "Moderate stenosis distal right M1 segment. Moderate to severe stenosis inferior branch of right middle cerebral artery. No large vessel occlusion. Moderate stenosis right PCA proximally and distally." Pt taken back to her room on [address] with complaints of increasing Chest pain.   MD Lindzen to speak with [place]. Pt to be transferred to [facility]. Q2 Hour mNIHSS and VS. Handoff given to [place], [place] RRt due to another emergency.

## 2019-09-30 NOTE — Progress Notes (Addendum)
Noted c/o chest pain, EKG done, 1 Ntg SL given with relief, Tonye Becket NP updated. SBP 127.

## 2019-09-30 NOTE — Consult Note (Signed)
NEURO HOSPITALIST CONSULT NOTE   Requestig physician: Dr. Margaretann Loveless  Reason for Consult: Lethargy, new left facial droop and LUE weakness  History obtained from: Son, Cardiology Team and Chart  HPI:                                                                                                                                          Brittney Kennedy is an 69 y.o. female with a history of cholecystectomy and left rotator cuff repair who was presented to [facility] on 3/25 with chief complaints of intermittent N/V x 1 week in the setting of intermittent epigastric pain for about 2 weeks. Initial work up revealed a non-ST elevation myocardial infarction. She was starteed on an IV heparin drip, admitted to a telemetry bed, started on ASA and cardiac work up was initiated, including a 2D echocardiogram. During her stay here she has also been diagnosed with CAD (cath 09/27/19 showed 3v CAD) and HFrEF (possible new onset acute CHF, likely diastolic, with EF 84%). She has been placed on diuresis with IV Lasix.   Dr. Haroldine Laws saw the patient at about 5 PM yesterday. At that time the patient was with AMS, overall appearance being most consistent with sedation. At 8 PM the son left and notes today that the patient still was with AMS at that time.   CT head obtained on 3/28 at 10:31 AM showed no acute intracranial pathology. Chronic microvascular disease and cerebral atrophy were noted.  Overnight, Rapid Response was called for left facial droop, LUE weakness, dysarthria and lethargy. Per RRT note from 4:08 AM, the dysarthria and lethargy components of her symptoms had been present since "since 3/28 AM" but exact LKN was not known. The initial confusion/lethargy symptoms were felt most likely to have been due to pain meds. Head CT and Code Stroke was not initiated due to unknown LSW and no LVO symptoms. CBG was 157. NIHSS at the time of Rapid Response RN evaluation overnight was 4.   CT head at that  time showed a right basal ganglia infarct, likely recent given the history. Ischemic change also was seen at the left putamen, age indeterminate by imaging. A small remote appearing right pontine infarct was also noted.   The first and second CT scans have some slight differences in appearance, which may be due to incidental variation in protocol parameters and slice locations, versus evolving recent strokes and/or new strokes occurring in the time interval between the two scans.  Upon discussion at 0446the time of this consult with Dr. Haroldine Laws and son, from a risk/benefit standpoint, Brittney Kennedy is best classifiable as 8 PM when her son last interacted with her, as her AMS is significantly more likely to be attributable to medication side effect than stroke. Her LUE weakness and  facial droop onset occurred sometime between 8 PM last night and 4 AM today.   Her exam is now worse relative to her NIHSS of 4, which was not consistent with an LVO per documentation provided at the time of RRT RN initial evaluation at 4:08 AM. Her exam at this time is now best classifiable as being consistent with an LVO.   CT head obtained at that time revealed the following: -- Low-density in the right caudate head and anterior body, also involving the upper right putamen.  -- Ischemic change also seen at the left putamen, age indeterminate by imaging. -- Small remote appearing right pontine infarct.  Past Medical History:  Diagnosis Date  . CAD (coronary artery disease)    a. 09/2019 NSTEMI/Cath: LM nl, LAD 90p, 47m diffuse mid-dist dzs, LCX 934mV groove/OM2, RI 90p, RCA dominant, nl, RPDA 9013mF 25%, glob HK.  . H[place]land KitchenrEF (heart failure with reduced [place] fraction[place]  a. 09/2019 LV gram: EF 25%, global HK.  . PMarland KitchenD (peptic ulcer disease)   . Seasonal allergies     Past Surgical History:  Procedure Laterality Date  . CHOLECYSTECTOMY    . LEFT HEART CATH AND CORS/GRAFTS ANGIOGRAPHY N/A 09/27/2019   Procedure:  LEFT HEART CATH AND CORS/GRAFTS ANGIOGRAPHY;  Surgeon: GolMinna MerrittsD;  [facility];  Service: Cardiovascular;  Laterality: N/A;    Family History  Problem Relation Age of Onset  . Stroke Mother        died @ 89 56 Multiple myeloma Father        died @ 93 78 Multiple myeloma Sister               Social History:  reports that she has never smoked. She has never used smokeless tobacco. She reports that she does not drink alcohol or use drugs.  Allergies  Allergen Reactions  . No [employer] Food Allergies Anaphylaxis and Hives    Raw onions    MEDICATIONS:                                                                                                                     Scheduled: . aspirin EC  81 mg Oral Daily  . atorvastatin  80 mg Oral q1800  . carvedilol  3.125 mg Oral BID WC  . digoxin  0.125 mg Oral Daily  . enoxaparin (LOVENOX) injection  40 mg Subcutaneous Q24H  . potassium chloride  40 mEq Oral Daily  . sodium chloride flush  3 mL Intravenous Q12H   Continuous: . sodium chloride     PRNNUU:VOZDGUloride, acetaminophen, nitroGLYCERIN, ondansetron (ZOFRAN) IV, sodium chloride flush   ROS:  History obtained from Patient son  General ROS: negative for - chills, fatigue, fever, night sweats, weight gain or weight loss Psychological ROS: negative for - behavioral disorder, hallucinations, memory difficulties, mood swings or suicidal ideation Ophthalmic ROS: negative for - blurry vision, double vision, eye pain or loss of vision ENT ROS: negative for - epistaxis, nasal discharge, oral lesions, sore throat, tinnitus or vertigo Allergy and Immunology ROS: negative for - hives or itchy/watery eyes Hematological and Lymphatic ROS: negative for - bleeding problems, bruising or swollen lymph nodes Endocrine ROS: negative  for - galactorrhea, hair pattern changes, polydipsia/polyuria or temperature intolerance Respiratory ROS: negative for - cough, hemoptysis, shortness of breath or wheezing Cardiovascular ROS: Positive for - chest pain,  Gastrointestinal ROS: negative for - abdominal pain, diarrhea, hematemesis, nausea/vomiting or stool incontinence Genito-Urinary ROS: negative for - dysuria, hematuria, incontinence or urinary frequency/urgency Musculoskeletal ROS: Positive for - muscular weakness Neurological ROS: as noted in HPI Dermatological ROS: negative for rash and skin lesion changes   Blood pressure 137/78, pulse 96, temperature 98.5 F (36.9 C), temperature source Axillary, resp. rate 20, height 5' 2"  (1.575 m), weight 72.9 kg, SpO2 99 %.   General Examination:                                                                                                       Physical Exam  HEENT-  Normocephalic, no lesions, without obvious abnormality.  Normal external eye and conjunctiva.   Cardiovascular- S1-S2 audible, pulses palpable throughout   Lungs-no rhonchi or wheezing noted, no excessive working breathing.  Saturations within normal limits Abdomen- All 4 quadrants palpated and nontender Extremities- Warm, dry and intact Musculoskeletal-no joint tenderness, deformity or swelling Skin-warm and dry, no hyperpigmentation, vitiligo, or suspicious lesions  Neurological Examination Mental Status: Decreased level of consciousness; will respond to voice and follow simple commands slowly. Decreased attention. Speech is hypophonic and dysarthric. Unable to test orientation due to decreased level of consciousness.  Cranial Nerves: II: Visual fields intact when tested individually. There is neglect in the left temporal visual field with double simultaneous stimulation. PERRL. III,IV, VI: No ptosis. Has right gaze preference, but able to cross the midline to the left.    V,VII: Left facial droop, facial light  touch sensation grossly intact bilaterally VIII: hearing intact to voice IX,X: Unable to visualize palate. Hypophonic speech.  XI: Head preferentially rotated to the right.  XII: midline tongue extension Motor: Right : Upper extremity   5/5    Left:     Upper extremity   4-/5  Lower extremity   5/5     Lower extremity   4/5 No atrophy noted Sensory: Pinprick and light touch intact throughout, bilaterally when tested individually. However, does have neglect to the left with DSS Deep Tendon Reflexes: 2+ and symmetric throughout Plantars: Right: Downgoing   Left: Upgoing Cerebellar: Normal finger-to-nose on the right.  Difficult to attempt secondary to weakness on the left but some mild dyssinergia is noted.  Gait: Deferred due to acuity of presentation.     Lab Results: Basic  Metabolic Panel: Recent Labs  Lab 09/26/19 1647 09/26/19 1647 09/27/19 0428 09/27/19 0428 09/28/19 0408 09/29/19 0526 09/30/19 0351  NA 139  --  143  --  142 140 141  K 3.0*  --  3.5  --  3.2* 3.5 3.7  CL 105  --  113*  --  106 107 107  CO2 18*  --  16*  --  16* 21* 20*  GLUCOSE 128*  --  112*  --  107* 150* 161*  BUN 10  --  10  --  10 14 12   CREATININE 0.77  --  0.71  --  0.83 0.75 0.90  CALCIUM 9.2   < > 8.6*   < > 8.9 8.7* 8.8*  MG  --   --   --   --  1.4* 2.4  --    < > = values in this interval not displayed.    CBC: Recent Labs  Lab 09/26/19 1647 09/27/19 0428 09/28/19 0408 09/29/19 0526 09/30/19 0351  WBC 4.1 5.1 10.3 9.6 8.1  NEUTROABS  --  2.9 6.6  --   --   HGB 15.3* 13.3 14.5 11.7* 11.2*  HCT 44.8 39.7 43.5 35.0* 33.5*  MCV 92.8 94.5 94.2 94.1 94.6  PLT 229 214 240 262 234    Cardiac Enzymes: No results for input(s): CKTOTAL, CKMB, CKMBINDEX, TROPONINI in the last 168 hours.  Lipid Panel: Recent Labs  Lab 09/27/19 0428  CHOL 97  TRIG 273*  HDL 21*  CHOLHDL 4.6  VLDL 55*  LDLCALC 21    Imaging:  CT HEAD WO CONTRAST Result Date: 09/30/2019 CLINICAL DATA:  Change in  mental status with left arm weakness EXAM: CT HEAD WITHOUT CONTRAST TECHNIQUE: Contiguous axial images were obtained from the base of the skull through the vertex without intravenous contrast. COMPARISON:  Head CT from yesterday FINDINGS: Brain: Low-density in the right caudate head and anterior body, also involving the upper right putamen. There is also indistinct appearance to the left putamen. Low-density in the right pons with fairly defined and flat appearance on reformats. No visible cortex infarct. No hemorrhage, hydrocephalus, or masslike finding. Vascular: No hyperdense vessel Skull: Negative Sinuses/Orbits: Anterior septal defect without nodularity. Other: These results will be called to the ordering clinician or representative by the Radiologist Assistant, and communication documented in the PACS or [employer]. IMPRESSION: 1. Right basal ganglia infarct, likely recent given the history. 2. Ischemic change also seen at the left putamen, age indeterminate by imaging. 3. Small remote appearing right pontine infarct. Electronically Signed   By: Monte Fantasia M.D.   On: 09/30/2019 04:59   CT HEAD WO CONTRAST Result Date: 09/29/2019 CLINICAL DATA:  Altered mental status EXAM: CT HEAD WITHOUT CONTRAST TECHNIQUE: Contiguous axial images were obtained from the base of the skull through the vertex without intravenous contrast. COMPARISON:  None. FINDINGS: Brain: No evidence of acute infarction, hemorrhage, extra-axial collection, ventriculomegaly, or mass effect. Generalized cerebral atrophy. Periventricular white matter low attenuation likely secondary to microangiopathy. Vascular: Cerebrovascular atherosclerotic calcifications are noted. Skull: Negative for fracture or focal lesion. Sinuses/Orbits: Visualized portions of the orbits are unremarkable. Visualized portions of the paranasal sinuses are unremarkable. Visualized portions of the mastoid air cells are unremarkable. Other: None. IMPRESSION: 1.  No acute intracranial pathology. 2. Chronic microvascular disease and cerebral atrophy. Electronically Signed   By: Kathreen Devoid   On: 09/29/2019 10:45   CTA head and neck with CTP: 1. 29 mL of delayed perfusion right  parietal lobe. No fixed infarct on CT perfusion. 2. Moderate stenosis distal right M1 segment. Moderate to severe stenosis inferior branch of right middle cerebral artery. No large vessel occlusion. 3. Moderate stenosis right PCA proximally and distally. 4. No significant carotid or vertebral artery stenosis in the neck. 5. These results were called by telephone at the time of interpretation on 09/30/2019 at 1:50 pm to provider Brnadon Eoff, who verbally acknowledged these results.  Assessment: 69 year old female with acute onset of left sided weakness and facial droop with AMS. Complex timeline of symptoms pertaining to [facility] and ineligibility for tPA as outlined in the HPI.  1. CTP reveals 29 mL of delayed perfusion right parietal lobe. No fixed infarct on CT perfusion. 2. CTA of head reveals moderate stenosis of the distal right M1 segment, moderate to severe stenosis of the inferior branch of right middle cerebral artery with no LVO, moderate stenosis of the right PCA proximally and distally. 3. CTA of neck reveals no significant carotid or vertebral artery stenosis.   Recommendations: 1. Not a tPA candidate due to time criteria 2. Not an endovascular candidate due to no LVO.  3. Antiplatelet therapy should include ASA with or without Plavix.  4. Statin 5. MRI brain when possible.  6. Given acute MI, there would be significant potential risk of cardiac worsening if full permissive HTN protocol is employed. Would hydrate well within the limits allowable by her CHF and have SBP goal of 120-160 for improved cerebral perfusion in the region of her acute perfusion deficit.  7. Discussed with Dr. Haroldine Laws.  8. The patient's stroke, neurological prognosis and plan of care also  discussed at length with the patient's son.   60 minutes spent in the emergent neurological evaluation and management of this critically ill patient.   Electronically signed: Dr. Kerney Elbe 09/30/2019, 12:20 PM

## 2019-09-30 NOTE — Progress Notes (Signed)
Rehab Admissions Coordinator Note:  Per PT/OT recommendation, patient was screened by Domingo Pulse for appropriateness for an Inpatient Acute Rehab Consult.  At this time, we are recommending Inpatient Rehab consult.  I will page MD for CIR consult.  Brittney Kennedy 09/30/2019, 3:21 PM  I can be reached at [phone].

## 2019-09-30 NOTE — Progress Notes (Signed)
Checked on patient around 4:00 and changed bed pads that were damp with sweat. Pt stated she sounded drunk and that her head " felt funny". Asked pt to smile and lift her arms. Left sided facial droop w/ smile and left arm drift. Rapid response nurse called and CT scan of head ordered. Rapid RN, Onalee Hua, and myself traveled with patient to CT. CT scan results called to MD by Rapid RN. When entering elevator after CT scan pt stated she was having chest pain/tightness. Upon return to floor 1 nitro SL given by Onalee Hua and EKG obtained with no acute changes. Chest pain relieved & pt resting. Will continue to monitor closely.

## 2019-10-01 ENCOUNTER — Encounter: Payer: Self-pay | Admitting: Cardiology

## 2019-10-01 ENCOUNTER — Inpatient Hospital Stay (HOSPITAL_COMMUNITY): Payer: Medicare Other

## 2019-10-01 DIAGNOSIS — R4182 Altered mental status, unspecified: Secondary | ICD-10-CM

## 2019-10-01 DIAGNOSIS — I6340 Cerebral infarction due to embolism of unspecified cerebral artery: Secondary | ICD-10-CM | POA: Diagnosis present

## 2019-10-01 LAB — BASIC METABOLIC PANEL
Anion gap: 16 — ABNORMAL HIGH (ref 5–15)
BUN: 14 mg/dL (ref 8–23)
CO2: 17 mmol/L — ABNORMAL LOW (ref 22–32)
Calcium: 9.1 mg/dL (ref 8.9–10.3)
Chloride: 111 mmol/L (ref 98–111)
Creatinine, Ser: 1.11 mg/dL — ABNORMAL HIGH (ref 0.44–1.00)
GFR calc Af Amer: 60 mL/min — ABNORMAL LOW (ref >60)
GFR calc non Af Amer: 51 mL/min — ABNORMAL LOW (ref >60)
Glucose, Bld: 118 mg/dL — ABNORMAL HIGH (ref 70–99)
Potassium: 4.3 mmol/L (ref 3.5–5.1)
Sodium: 144 mmol/L (ref 135–145)

## 2019-10-01 LAB — CBC
HCT: 33.6 % — ABNORMAL LOW (ref 36.0–46.0)
Hemoglobin: 10.9 g/dL — ABNORMAL LOW (ref 12.0–15.0)
MCH: 31.4 pg (ref 26.0–34.0)
MCHC: 32.4 g/dL (ref 30.0–36.0)
MCV: 96.8 fL (ref 80.0–100.0)
Platelets: 324 10*3/uL (ref 150–400)
RBC: 3.47 MIL/uL — ABNORMAL LOW (ref 3.87–5.11)
RDW: 13.4 % (ref 11.5–15.5)
WBC: 11.3 10*3/uL — ABNORMAL HIGH (ref 4.0–10.5)
nRBC: 0 % (ref 0.0–0.2)

## 2019-10-01 LAB — LIPID PANEL
Cholesterol: 99 mg/dL (ref 0–200)
HDL: 27 mg/dL — ABNORMAL LOW (ref >40)
LDL Cholesterol: 37 mg/dL (ref 0–99)
Total CHOL/HDL Ratio: 3.7 RATIO
Triglycerides: 174 mg/dL — ABNORMAL HIGH (ref <150)
VLDL: 35 mg/dL (ref 0–40)

## 2019-10-01 LAB — GLUCOSE, CAPILLARY: Glucose-Capillary: 113 mg/dL — ABNORMAL HIGH (ref 70–99)

## 2019-10-01 LAB — MAGNESIUM
Magnesium: 1.8 mg/dL (ref 1.7–2.4)
Magnesium: 1.9 mg/dL (ref 1.7–2.4)

## 2019-10-01 LAB — PHOSPHORUS
Phosphorus: 3 mg/dL (ref 2.5–4.6)
Phosphorus: 3.1 mg/dL (ref 2.5–4.6)

## 2019-10-01 MED ORDER — METOPROLOL TARTRATE 5 MG/5ML IV SOLN
2.5000 mg | Freq: Four times a day (QID) | INTRAVENOUS | Status: DC
  Administered 2019-10-01 – 2019-10-02 (×4): 2.5 mg via INTRAVENOUS
  Filled 2019-10-01 (×4): qty 5

## 2019-10-01 MED ORDER — ACETAMINOPHEN 325 MG PO TABS
650.0000 mg | ORAL_TABLET | ORAL | Status: DC | PRN
  Administered 2019-10-02: 650 mg
  Filled 2019-10-01 (×2): qty 2

## 2019-10-01 MED ORDER — ASPIRIN 300 MG RE SUPP
300.0000 mg | Freq: Every day | RECTAL | Status: DC
  Administered 2019-10-01: 300 mg via RECTAL
  Filled 2019-10-01: qty 1

## 2019-10-01 MED ORDER — ACETAMINOPHEN 325 MG PO TABS: 650.0000 mg | ORAL_TABLET | ORAL | Status: DC | PRN

## 2019-10-01 MED ORDER — PRO-STAT SUGAR FREE PO LIQD: 30.0000 mL | Freq: Two times a day (BID) | ORAL | Status: DC

## 2019-10-01 MED ORDER — DIGOXIN 125 MCG PO TABS
0.1250 mg | ORAL_TABLET | Freq: Every day | ORAL | Status: DC
  Administered 2019-10-02 – 2019-10-03 (×2): 0.125 mg
  Filled 2019-10-01 (×2): qty 1

## 2019-10-01 MED ORDER — ACETAMINOPHEN 650 MG RE SUPP
650.0000 mg | RECTAL | Status: DC | PRN
  Administered 2019-10-01 – 2019-10-03 (×3): 650 mg via RECTAL
  Filled 2019-10-01 (×3): qty 1

## 2019-10-01 MED ORDER — CLOPIDOGREL BISULFATE 75 MG PO TABS
75.0000 mg | ORAL_TABLET | Freq: Every day | ORAL | Status: DC
  Administered 2019-10-01 – 2019-10-06 (×6): 75 mg
  Filled 2019-10-01 (×6): qty 1

## 2019-10-01 MED ORDER — ATORVASTATIN CALCIUM 80 MG PO TABS
80.0000 mg | ORAL_TABLET | Freq: Every day | ORAL | Status: DC
  Administered 2019-10-02 – 2019-10-05 (×3): 80 mg
  Filled 2019-10-01 (×3): qty 1

## 2019-10-01 MED ORDER — ASPIRIN 81 MG PO CHEW
81.0000 mg | CHEWABLE_TABLET | Freq: Every day | ORAL | Status: DC
  Administered 2019-10-02 – 2019-10-06 (×5): 81 mg
  Filled 2019-10-01 (×5): qty 1

## 2019-10-01 MED ORDER — OSMOLITE 1.2 CAL PO LIQD
1000.0000 mL | ORAL | Status: DC
  Administered 2019-10-02 – 2019-10-04 (×2): 1000 mL
  Filled 2019-10-01 (×8): qty 1000

## 2019-10-01 MED ORDER — VITAL HIGH PROTEIN PO LIQD: 1000.0000 mL | ORAL | Status: DC

## 2019-10-01 NOTE — Progress Notes (Signed)
Pt. Reported that her head began hurting around 4 am. During that time nurse assessed neuro status and discovered that pt seemed to be more drowsy/lethargic and speech even more incomprehensible than the 2am neuro assessment. Left sided weakness appeared to be more prominent as well. Nurse paged the night shift neuro doctor, Dr. Amada Jupiter, at 0510 and received an immediate response to do a STAT CT. CT called nurse at [facility] informing nurse that they were ready to perform the CT scan and nurse took pt down for scan.

## 2019-10-01 NOTE — Progress Notes (Signed)
EEG completed, results pending. 

## 2019-10-01 NOTE — Progress Notes (Signed)
STROKE TEAM PROGRESS NOTE   INTERVAL HISTORY I personally reviewed history of presenting illness, electronic medical records and imaging films in PACS.  Patient remains drowsy with left face and upper extremity weakness.  Vital signs are stable.  CT scan of the brain was done this morning due to severe headache and neurological worsening and shows no right parietal hypodensity compatible with the perfusion defect seen on the CT perfusion earlier.  MRI of heart shows diminished ejection fraction of 29%.  Hemoglobin A1c is 5.9.  Lipid profile is pending  Vitals:   10/01/19 0700 10/01/19 0730 10/01/19 0745 10/01/19 0800  BP: (!) 151/57 (!) 143/61  136/65  Pulse: (!) 109 (!) 109  (!) 110  Resp: 20 20  (!) 26  Temp:   (!) 97.4 F (36.3 C)   TempSrc:   Oral   SpO2: 100% 100%  100%  Weight:      Height:        CBC:  Recent Labs  Lab 09/27/19 0428 09/27/19 0428 09/28/19 0408 09/29/19 0526 09/30/19 0351 10/01/19 0306  WBC 5.1   < > 10.3   < > 8.1 11.3*  NEUTROABS 2.9  --  6.6  --   --   --   HGB 13.3   < > 14.5   < > 11.2* 10.9*  HCT 39.7   < > 43.5   < > 33.5* 33.6*  MCV 94.5   < > 94.2   < > 94.6 96.8  PLT 214   < > 240   < > 234 324   < > = values in this interval not displayed.    Basic Metabolic Panel:  Recent Labs  Lab 09/28/19 0408 09/28/19 0408 09/29/19 0526 09/29/19 0526 09/30/19 0351 10/01/19 0306  NA 142   < > 140   < > 141 144  K 3.2*   < > 3.5   < > 3.7 4.3  CL 106   < > 107   < > 107 111  CO2 16*   < > 21*   < > 20* 17*  GLUCOSE 107*   < > 150*   < > 161* 118*  BUN 10   < > 14   < > 12 14  CREATININE 0.83   < > 0.75   < > 0.90 1.11*  CALCIUM 8.9   < > 8.7*   < > 8.8* 9.1  MG 1.4*  --  2.4  --   --   --    < > = values in this interval not displayed.   Lipid Panel:     Component Value Date/Time   CHOL 99 10/01/2019 0306   TRIG 174 (H) 10/01/2019 0306   HDL 27 (L) 10/01/2019 0306   CHOLHDL 3.7 10/01/2019 0306   VLDL 35 10/01/2019 0306   LDLCALC 37  10/01/2019 0306   HgbA1c:  Lab Results  Component Value Date   HGBA1C 5.9 (H) 09/26/2019   Urine Drug Screen:     Component Value Date/Time   LABOPIA NONE DETECTED 09/29/2019 1433   COCAINSCRNUR NONE DETECTED 09/29/2019 1433   LABBENZ POSITIVE (A) 09/29/2019 1433   AMPHETMU NONE DETECTED 09/29/2019 1433   THCU NONE DETECTED 09/29/2019 1433   LABBARB NONE DETECTED 09/29/2019 1433    Alcohol Level No results found for: ETH  IMAGING past 24 hours CT ANGIO HEAD W OR WO CONTRAST  Result Date: 09/30/2019 CLINICAL DATA:  Stroke. Left facial droop and left arm weakness.  Slurred speech. EXAM: CT ANGIOGRAPHY HEAD AND NECK CT PERFUSION BRAIN TECHNIQUE: Multidetector CT imaging of the head and neck was performed using the standard protocol during bolus administration of intravenous contrast. Multiplanar CT image reconstructions and MIPs were obtained to evaluate the vascular anatomy. Carotid stenosis measurements (when applicable) are obtained utilizing NASCET criteria, using the distal internal carotid diameter as the denominator. Multiphase CT imaging of the brain was performed following IV bolus contrast injection. Subsequent parametric perfusion maps were calculated using RAPID software. CONTRAST:  OMNIPAQUE IOHEXOL 350 MG/ML SOLN COMPARISON:  CT head 09/30/2019 FINDINGS: CTA NECK FINDINGS Aortic arch: Atherosclerotic aortic arch. Three-vessel arch. Proximal great vessels widely patent. Right carotid system: Atherosclerotic calcification distal right common carotid artery without significant stenosis. Atherosclerotic disease right carotid bifurcation without significant stenosis. Tortuous right internal carotid artery. Left carotid system: Mild atherosclerotic disease left common carotid artery without significant stenosis. Left carotid bifurcation widely patent without stenosis. Vertebral arteries: Left vertebral artery dominant. Both vertebral arteries widely patent to the basilar without  stenosis. Skeleton: No acute skeletal abnormality. Cervical spine degenerative changes relatively mild. Other neck: Negative for mass or adenopathy. 1 cm right thyroid nodule. No further imaging necessary. Upper chest: Lung apices clear bilaterally. Small left pleural effusion. Review of the MIP images confirms the above findings CTA HEAD FINDINGS Anterior circulation: Cavernous carotid patent bilaterally without significant stenosis. Moderate stenosis distal right M1 segment. Moderate to severe focal stenosis inferior division of right middle cerebral artery. This corresponds to the area of decreased perfusion on the perfusion study. No vessel occlusion or thrombus identified. Both anterior cerebral arteries patent without stenosis. Left MCA patent without significant stenosis. Posterior circulation: Both vertebral arteries patent to the basilar. PICA patent bilaterally. Basilar widely patent. Superior cerebellar and posterior cerebral arteries patent bilaterally. Mild to moderate stenosis proximal right posterior cerebral artery. Moderate stenosis distal right posterior cerebral artery. Fetal origin of the left posterior cerebral artery. Venous sinuses: Normal venous enhancement. Anatomic variants: None Review of the MIP images confirms the above findings CT Brain Perfusion Findings: ASPECTS: 9 CBF (<30%) Volume: 17mL Perfusion (Tmax>6.0s) volume: 70mL Mismatch Volume: 38mL Infarction Location:Right parietal lobe. IMPRESSION: 1. 29 mL of delayed perfusion right parietal lobe. No fixed infarct on CT perfusion. 2. Moderate stenosis distal right M1 segment. Moderate to severe stenosis inferior branch of right middle cerebral artery. No large vessel occlusion. 3. Moderate stenosis right PCA proximally and distally. 4. No significant carotid or vertebral artery stenosis in the neck. 5. These results were called by telephone at the time of interpretation on 09/30/2019 at 1:50 pm to provider Lindzen, who verbally  acknowledged these results. Electronically Signed   By: Marlan Palau M.D.   On: 09/30/2019 13:51   CT HEAD WO CONTRAST  Result Date: 10/01/2019 CLINICAL DATA:  Stroke follow-up. Lethargy and confusion with right temporal headache. EXAM: CT HEAD WITHOUT CONTRAST TECHNIQUE: Contiguous axial images were obtained from the base of the skull through the vertex without intravenous contrast. COMPARISON:  CT and CTA from yesterday FINDINGS: Brain: Loss of gray-white differentiation centered in the right parietal lobe correlating with the perfusion defect on prior. No hemorrhagic conversion. There has been age-indeterminate infarction of the right caudate head, stable from yesterday. No hydrocephalus or masslike finding Vascular: No hyperdense vessel. Skull: Normal. Negative for fracture or focal lesion. Sinuses/Orbits: No acute finding Other: Motion degraded IMPRESSION: 1. Acute infarct centered in the right parietal lobe correlating with perfusion defects yesterday. 2. Age-indeterminate infarct at the right caudate  that is stable from yesterday. 3. Motion degraded Electronically Signed   By: Marnee Spring M.D.   On: 10/01/2019 06:56   CT ANGIO NECK W OR WO CONTRAST  Result Date: 09/30/2019 CLINICAL DATA:  Stroke. Left facial droop and left arm weakness. Slurred speech. EXAM: CT ANGIOGRAPHY HEAD AND NECK CT PERFUSION BRAIN TECHNIQUE: Multidetector CT imaging of the head and neck was performed using the standard protocol during bolus administration of intravenous contrast. Multiplanar CT image reconstructions and MIPs were obtained to evaluate the vascular anatomy. Carotid stenosis measurements (when applicable) are obtained utilizing NASCET criteria, using the distal internal carotid diameter as the denominator. Multiphase CT imaging of the brain was performed following IV bolus contrast injection. Subsequent parametric perfusion maps were calculated using RAPID software. CONTRAST:  OMNIPAQUE IOHEXOL 350  MG/ML SOLN COMPARISON:  CT head 09/30/2019 FINDINGS: CTA NECK FINDINGS Aortic arch: Atherosclerotic aortic arch. Three-vessel arch. Proximal great vessels widely patent. Right carotid system: Atherosclerotic calcification distal right common carotid artery without significant stenosis. Atherosclerotic disease right carotid bifurcation without significant stenosis. Tortuous right internal carotid artery. Left carotid system: Mild atherosclerotic disease left common carotid artery without significant stenosis. Left carotid bifurcation widely patent without stenosis. Vertebral arteries: Left vertebral artery dominant. Both vertebral arteries widely patent to the basilar without stenosis. Skeleton: No acute skeletal abnormality. Cervical spine degenerative changes relatively mild. Other neck: Negative for mass or adenopathy. 1 cm right thyroid nodule. No further imaging necessary. Upper chest: Lung apices clear bilaterally. Small left pleural effusion. Review of the MIP images confirms the above findings CTA HEAD FINDINGS Anterior circulation: Cavernous carotid patent bilaterally without significant stenosis. Moderate stenosis distal right M1 segment. Moderate to severe focal stenosis inferior division of right middle cerebral artery. This corresponds to the area of decreased perfusion on the perfusion study. No vessel occlusion or thrombus identified. Both anterior cerebral arteries patent without stenosis. Left MCA patent without significant stenosis. Posterior circulation: Both vertebral arteries patent to the basilar. PICA patent bilaterally. Basilar widely patent. Superior cerebellar and posterior cerebral arteries patent bilaterally. Mild to moderate stenosis proximal right posterior cerebral artery. Moderate stenosis distal right posterior cerebral artery. Fetal origin of the left posterior cerebral artery. Venous sinuses: Normal venous enhancement. Anatomic variants: None Review of the MIP images confirms the  above findings CT Brain Perfusion Findings: ASPECTS: 9 CBF (<30%) Volume: 0mL Perfusion (Tmax>6.0s) volume: 29mL Mismatch Volume: 29mL Infarction Location:Right parietal lobe. IMPRESSION: 1. 29 mL of delayed perfusion right parietal lobe. No fixed infarct on CT perfusion. 2. Moderate stenosis distal right M1 segment. Moderate to severe stenosis inferior branch of right middle cerebral artery. No large vessel occlusion. 3. Moderate stenosis right PCA proximally and distally. 4. No significant carotid or vertebral artery stenosis in the neck. 5. These results were called by telephone at the time of interpretation on 09/30/2019 at 1:50 pm to provider Lindzen, who verbally acknowledged these results. Electronically Signed   By: Marlan Palau M.D.   On: 09/30/2019 13:51   CARDIAC CATHETERIZATION  Result Date: 09/30/2019  Proximal LAD is 90% stenosed  A drug-eluting stent was successfully placed using a STENT RESOLUTE ONYX 3.0X12.  Post intervention, there is a 0% residual stenosis.  Mid Cx lesion is 95% stenosed.  A drug-eluting stent was successfully placed using a STENT RESOLUTE ONYX 2.5X26.  Post intervention, there is a 0% residual stenosis.  1. Successful PCI of the proximal LAD with DES x 1 2. Successful PCI of the mid LCx with DES x  1. Plan: DAPT for one year. Will continue IV Cangrelor until NG in place and patient can be loaded with oral Plavix.   CT CEREBRAL PERFUSION W CONTRAST  Result Date: 09/30/2019 CLINICAL DATA:  Stroke. Left facial droop and left arm weakness. Slurred speech. EXAM: CT ANGIOGRAPHY HEAD AND NECK CT PERFUSION BRAIN TECHNIQUE: Multidetector CT imaging of the head and neck was performed using the standard protocol during bolus administration of intravenous contrast. Multiplanar CT image reconstructions and MIPs were obtained to evaluate the vascular anatomy. Carotid stenosis measurements (when applicable) are obtained utilizing NASCET criteria, using the distal internal carotid  diameter as the denominator. Multiphase CT imaging of the brain was performed following IV bolus contrast injection. Subsequent parametric perfusion maps were calculated using RAPID software. CONTRAST:  OMNIPAQUE IOHEXOL 350 MG/ML SOLN COMPARISON:  CT head 09/30/2019 FINDINGS: CTA NECK FINDINGS Aortic arch: Atherosclerotic aortic arch. Three-vessel arch. Proximal great vessels widely patent. Right carotid system: Atherosclerotic calcification distal right common carotid artery without significant stenosis. Atherosclerotic disease right carotid bifurcation without significant stenosis. Tortuous right internal carotid artery. Left carotid system: Mild atherosclerotic disease left common carotid artery without significant stenosis. Left carotid bifurcation widely patent without stenosis. Vertebral arteries: Left vertebral artery dominant. Both vertebral arteries widely patent to the basilar without stenosis. Skeleton: No acute skeletal abnormality. Cervical spine degenerative changes relatively mild. Other neck: Negative for mass or adenopathy. 1 cm right thyroid nodule. No further imaging necessary. Upper chest: Lung apices clear bilaterally. Small left pleural effusion. Review of the MIP images confirms the above findings CTA HEAD FINDINGS Anterior circulation: Cavernous carotid patent bilaterally without significant stenosis. Moderate stenosis distal right M1 segment. Moderate to severe focal stenosis inferior division of right middle cerebral artery. This corresponds to the area of decreased perfusion on the perfusion study. No vessel occlusion or thrombus identified. Both anterior cerebral arteries patent without stenosis. Left MCA patent without significant stenosis. Posterior circulation: Both vertebral arteries patent to the basilar. PICA patent bilaterally. Basilar widely patent. Superior cerebellar and posterior cerebral arteries patent bilaterally. Mild to moderate stenosis proximal right posterior  cerebral artery. Moderate stenosis distal right posterior cerebral artery. Fetal origin of the left posterior cerebral artery. Venous sinuses: Normal venous enhancement. Anatomic variants: None Review of the MIP images confirms the above findings CT Brain Perfusion Findings: ASPECTS: 9 CBF (<30%) Volume: 87mL Perfusion (Tmax>6.0s) volume: 65mL Mismatch Volume: 16mL Infarction Location:Right parietal lobe. IMPRESSION: 1. 29 mL of delayed perfusion right parietal lobe. No fixed infarct on CT perfusion. 2. Moderate stenosis distal right M1 segment. Moderate to severe stenosis inferior branch of right middle cerebral artery. No large vessel occlusion. 3. Moderate stenosis right PCA proximally and distally. 4. No significant carotid or vertebral artery stenosis in the neck. 5. These results were called by telephone at the time of interpretation on 09/30/2019 at 1:50 pm to provider Lindzen, who verbally acknowledged these results. Electronically Signed   By: Marlan Palau M.D.   On: 09/30/2019 13:51   MR CARDIAC MORPHOLOGY W WO CONTRAST  Result Date: 09/30/2019 CLINICAL DATA:  Patient hx of CAD, HFrEF. Cardiac MR performed to evaluate viability EXAM: CARDIAC MRI TECHNIQUE: The patient was scanned on a 1.5 Tesla GE magnet. A dedicated cardiac coil was used. Functional imaging was done using Fiesta sequences. 2,3, and 4 chamber views were done to assess for RWMA's. Modified Simpson's rule using a short axis stack was used to calculate an ejection fraction on a dedicated work [employer]. The patient  received cc of Gadavist. After 10 minutes inversion recovery sequences were used to assess for infiltration and scar tissue. Due to uncooperative patient, free breathing techniques implemented. CONTRAST:  7 cc  of Gadavist FINDINGS: 1. Normal left ventricular size and thickness. The systolic function is severely reduced (LVEF =29%). There is global hypokinesis. There is no late gadolinium enhancement in the  left ventricular myocardium. LVEDV:171 ml LVESV: 121 ml SV: 50 ml CO: 3.98L/min Myocardial mass: 180g 2. Normal right ventricular size and thickness. RV systolic function is mild to moderately reduced (RVEF =37%). There is global hypokinesis. 3.  Left atrium is mildly dilated, normal right atrial size. 4. Normal size of the aortic root, ascending aorta and pulmonary artery. 5.  No significant valvular abnormalities. 6.  Normal pericardium. Mild circumferencial pericardial effusion. IMPRESSION: 1. Severely reduced LV systolic function, global hypokinesis, LVEF of 29% 2. No evidence of Scar/late gadolinium enhancement. Entire LV myocardium appears viable 3.  Mild to moderately reduced RVEF 4.  Mild circuferential pericardial effusion 5.  Normal LV and RV chamber sizes and thickness 6. Due to uncooperative patient, free breathing techniques implemented which hinders image quality. Electronically Signed   By: Debbe Odea M.D.   On: 09/30/2019 12:33    PHYSICAL EXAM [name] not in distress. . Afebrile. Head is nontraumatic. Neck is supple without bruit.    Cardiac exam no murmur or gallop. Lungs are clear to auscultation. Distal pulses are well felt. Neurological Exam :  She is lethargic, drowsy but can be aroused with some difficulty.  She will respond to simple midline and one-step commands.  But attention is diminished.  Speech is dysarthric and hypophonic but can be understood with some difficulty.  Right gaze preference.  Unable to look all the way to the left.  Blinks to threat on the right but not on the left.  Fundi not visualized.  Left lower facial weakness.  Tongue midline.  Motor system exam shows left upper extremity weakness 3/5 strength with more weakness distally in the grip and intrinsic hand muscles.  Mild left lower extremity weakness.  Normal strength on the right.  Sensation appears symmetric.  Plantars downgoing.  Gait not tested. ASSESSMENT/PLAN [name] is a 69 y.o. female with history of cholecystectomy and left rotator cuff repair presenting to [place] 3/25 with intermittent N/V x 1 wk and intermittent epigastric pain x 2 weeks. Found to have a NSTEMI. Admitted for workup, which revealed CAD, HFrEF who developed AMS. CT head neg. Developed L facial droop, LUE weakness, dysarthria and LUE weakness. CT now w/ R basal ganglia infarct and possible L putamen abnormality d/t LVO. AMS felt d/t medication side effect.  .   Stroke:   R parietal infarct embolic in setting of acute MI s/p cath w stent placement  CT head 3/28 No acute abnormality. Small vessel disease. Atrophy.   CT head 3/29 R basal ganglia infarct. L putamen infarct, age indeterminate. Small old R pontine infarct   CTA head & neck distal R M1 moderate stenosis, R MCA branch inferior branch moderate to severe stenosis. Proximal and distal R PCA moderate stenosis.   CT perfusion 6ml penumbra R parietal love  CT head 3/30 R basal ganglia infarct. R caudate infarct.  2D Echo EF 25-30%.   EEG nonspecific R slowing, no sz  LDL 37  HgbA1c 5.9  Lovenox 40 mg sq daily for VTE prophylaxis  No antithrombotic prior to admission, now ordered  aspirin 81 mg daily and clopidogrel 75 mg daily but unable to swallow. Changed aspirin to suppository and  Per Dr. Pearlean Brownie added IV heparin. If pt can swallow or tube placed, ok to continue aspirin and plavix and stop IV heparin.   Therapy recommendations:  CIR  Disposition:  pending   CAD w/ NSTEMI  Severe diffuse 3v dz w/ severe LV dysfunction  Possible CABG  ACS  S/p stent placement proximal mid LAD and mid CX  Loaded on plavix  Acute systolic HF d/t NICM  EF 25%  Off BP meds to allow for permissive HTN  R groin hematoma following fall  Hematoma at cath site  CT w. Hematoma  Off IV heparin  On lovenox  Tachycardia  Per Dr. Pearlean Brownie started metoprolol 2.5 q6h   Blood Pressure . Permissive  hypertension (OK if < 220/120) but gradually normalize in 5-7 days . Long-term BP goal normotensive  Hyperlipidemia  Home meds:  No statin listed  LDL 37, goal < 70  Now on lipitor 80  Continue statin at discharge  Dysphagia Severe protein calorie malnutrition . Secondary to stroke . NPO . Speech consulted to assess  . Unable to swallow . Coretrak and TF ordered to start  Other Stroke Risk Factors  Advanced age  Overweight, Body mass index is 29.4 kg/m., recommend weight loss, diet and exercise as appropriate   Family hx stroke (mother)  New onset Congestive heart failure - HFrEF  Other Active Problems  Hypokalemia/hypomagnesemia    Hospital day # 4  Patient developed sudden onset of headache and left face and arm weakness secondary to right parietal embolic infarct in the setting of acute MI as well as cardiomyopathy with diminished ejection fraction.  Recommend continue aspirin and Plavix use patient is able to swallow but if not switch to IV heparin and rectal aspirin.  Check lipid profile.  Physical occupational therapy as well as speech therapy consults.  Discussed with nurse at the bedside and answered questions.  Greater than 50% time during this 35-minute visit was spent on counseling and coordination of care about her embolic stroke and discussion about evaluation treatment and answering questions. Delia Heady, MD To contact Stroke Continuity provider, please refer to [email or web address]. After hours, contact General Neurology

## 2019-10-01 NOTE — Progress Notes (Signed)
Initial Nutrition Assessment  DOCUMENTATION CODES:   Non-severe (moderate) malnutrition in context of acute illness/injury  INTERVENTION:   Tube Feeding:  Osmolite 1.2 at 60 ml/hr Begin TF at 20 ml/hr; titrate by 10 mL q 4 hours until goal rate of 60 ml/hr Provides 1728 kcals, 80 g of protein and 1166 mL of free water flush   NUTRITION DIAGNOSIS:   Moderate Malnutrition related to acute illness as evidenced by mild muscle depletion, mild fat depletion.  GOAL:   Patient will meet greater than or equal to 90% of their needs  MONITOR:   Diet advancement, TF tolerance, Labs, Weight trends  REASON FOR ASSESSMENT:   Consult Enteral/tube feeding initiation and management  ASSESSMENT:   69 yo female admitted with NSTEMI, acute CHF and later had a change in mental status and found to have acute stroke. PMH includes PUD, seasonal allergies, cholecystectomy and left rotator cuff repair   3/26 Cardiac Cath, 3v CAD, EF 20% (new onset acute CHF) 3/28 Rapid response due to lethargy, slurred speech, CT head no acute findings 3/29 CVA: R basal infarct, small r pontine infarct 3/30 CT acute infact centered in r. Parietal lobe  Nodding appropriately today per MD notes but very lethargic on visit. Moaning during Nutrition Focused Physical Exam  Per son, pt's appetite has been decreased and eating less and pt indicating she has lost weight but unsure how much. At baseline, pt lives with her best friend. She is mobile and does her own grocery shopping and cooking  NPO, SLP attempted bedside swallow today but lethargy impeded exam.   Order for Cortrak placement; service available on Mondays, Wednesdays and Fridays. Plan for RN to place NG tube in interim today  Labs: phosphorus 3.0 (wdl), magnesium 18 (wdl Meds: KCl  NUTRITION - FOCUSED PHYSICAL EXAM:    Most Recent Value  Orbital Region  Mild depletion  Upper Arm Region  No depletion  Thoracic and Lumbar Region  No depletion   Buccal Region  Mild depletion  Temple Region  Mild depletion  Clavicle Bone Region  Mild depletion  Clavicle and Acromion Bone Region  Mild depletion  Scapular Bone Region  No depletion  Dorsal Hand  No depletion  Patellar Region  Mild depletion  Anterior Thigh Region  Mild depletion  Posterior Calf Region  Mild depletion  Edema (RD Assessment)  Mild       Diet Order:   Diet Order            Diet NPO time specified  Diet effective now              EDUCATION NEEDS:   Not appropriate for education at this time  Skin:  Skin Assessment: Reviewed RN Assessment  Last BM:  3/30  Height:   Ht Readings from Last 1 Encounters:  09/27/19 5\' 2"  (1.575 m)    Weight:   Wt Readings from Last 1 Encounters:  09/30/19 72.9 kg   BMI:  Body mass index is 29.4 kg/m.  Estimated Nutritional Needs:   Kcal:  1500-1700 kcals  Protein:  75-85 g  Fluid:  >/= 1.5 L   10/02/19 MS, RDN, LDN, CNSC RD Pager Number and Weekend/On-Call After Hours Pager Located in [place]

## 2019-10-01 NOTE — Evaluation (Signed)
Clinical/Bedside Swallow Evaluation Patient Details  Name: Brittney Kennedy MRN: [id number] Date of Birth: 27-May-1951  Today's Date: 10/01/2019 Time: SLP Start Time (ACUTE ONLY): 1023 SLP Stop Time (ACUTE ONLY): 1035 SLP Time Calculation (min) (ACUTE ONLY): 12 min  Past Medical History:  Past Medical History:  Diagnosis Date  . CAD (coronary artery disease)    a. 09/2019 NSTEMI/Cath: LM nl, LAD 90p, 66m, diffuse mid-dist dzs, LCX 52m AV groove/OM2, RI 90p, RCA dominant, nl, RPDA 10m, EF 25%, glob HK.  Marland Kitchen HFrEF (heart failure with reduced ejection fraction) (HCC)    a. 09/2019 LV gram: EF 25%, global HK.  Marland Kitchen PUD (peptic ulcer disease)   . Seasonal allergies    Past Surgical History:  Past Surgical History:  Procedure Laterality Date  . CHOLECYSTECTOMY    . CORONARY STENT INTERVENTION N/A 09/30/2019   Procedure: CORONARY STENT INTERVENTION;  Surgeon: Swaziland, Peter M, MD;  Location: [facility] INVASIVE CV LAB;  Service: Cardiovascular;  Laterality: N/A;  . LEFT HEART CATH AND CORONARY ANGIOGRAPHY N/A 09/30/2019   Procedure: LEFT HEART CATH AND CORONARY ANGIOGRAPHY;  Surgeon: Swaziland, Peter M, MD;  Location: [facility] INVASIVE CV LAB;  Service: Cardiovascular;  Laterality: N/A;  . LEFT HEART CATH AND CORS/GRAFTS ANGIOGRAPHY N/A 09/27/2019   Procedure: LEFT HEART CATH AND CORS/GRAFTS ANGIOGRAPHY;  Surgeon: Antonieta Iba, MD;  Location: [facility] INVASIVE CV LAB;  Service: Cardiovascular;  Laterality: N/A;   HPI:  69 y/o female with hx of CAD, cholecystectomy, presented to ED with nausea/vomiting, epigastric pain for 1 week intermittently.  Admitted for NSTEMI, cath 3/26 revealed severe multivessel CAD and transferred to [facility].  Fall 3/27 with R groin hematoma.  Found AMS 3/28 due to pain meds.  Rapid response 3/29 due to AMS and L facial droop/weakness. CT revealed right basal ganglia infarct.  3/29 pm with worsening deficits; STAT CT head with right parietal infarct.    Assessment / Plan / Recommendation Clinical  Impression  Pt lethargic, therefore clinical swallow exam was limited.  Pt intermittently followed simple commands; there was mild right central deficit CN VII, tongue midline but did not extend beyond lower teeth.  Pt maintained eyes closed for duration, requiring constant verbal/tactile cues to stay awake and open her eyes.  She consumed limited bites of pudding, which she held orally, and eventually swallowed with max verbal cues.  Thin liquids were consumed from a straw followed by immediate coughing.  For now, given current lethargy, she is a high risk for aspiration. Continue NPO, allowing ice chips only.  Pt may benefit from cortrak.  D/W RN.  SLP will follow for PO readiness, cognitive-linguistic assessment.  SLP Visit Diagnosis: Dysphagia, oropharyngeal phase (R13.12)    Aspiration Risk  Moderate aspiration risk    Diet Recommendation    Npo except ice chips      Other  Recommendations Oral Care Recommendations: Oral care QID;Oral care prior to ice chip/H20   Follow up Recommendations Other (comment)(tba)      Frequency and Duration min 3x week  2 weeks       Prognosis Prognosis for Safe Diet Advancement: Good      Swallow Study   General HPI: 69 y/o female with hx of CAD, cholecystectomy, presented to ED with nausea/vomiting, epigastric pain for 1 week intermittently.  Admitted for NSTEMI, cath 3/26 revealed severe multivessel CAD and transferred to [facility].  Fall 3/27 with R groin hematoma.  Found AMS 3/28 due to pain meds.  Rapid response 3/29 due to AMS  and L facial droop/weakness. CT revealed right basal ganglia infarct.  3/29 pm with worsening deficits; STAT CT head with right parietal infarct.  Type of Study: Bedside Swallow Evaluation Previous Swallow Assessment: no Diet Prior to this Study: NPO Temperature Spikes Noted: No Respiratory Status: Room air History of Recent Intubation: No Behavior/Cognition: Lethargic/Drowsy Oral Cavity Assessment: Dry Oral Care Completed  by SLP: Recent completion by staff Vision: Impaired for self-feeding Self-Feeding Abilities: Needs assist Patient Positioning: Upright in bed Baseline Vocal Quality: Normal Volitional Cough: Cognitively unable to elicit Volitional Swallow: Unable to elicit    Oral/Motor/Sensory Function Overall Oral Motor/Sensory Function: Mild impairment Facial Symmetry: Suspected CN VII (facial) dysfunction;Abnormal symmetry left   Ice Chips Ice chips: Within functional limits   Thin Liquid Thin Liquid: Impaired Oral Phase Functional Implications: Left anterior spillage Pharyngeal  Phase Impairments: Cough - Immediate    Nectar Thick Nectar Thick Liquid: Not tested   Honey Thick Honey Thick Liquid: Not tested   Puree Puree: Impaired Presentation: Spoon Oral Phase Functional Implications: Prolonged oral transit;Oral holding Pharyngeal Phase Impairments: Suspected delayed Swallow   Solid     Solid: Not tested      Brittney Kennedy 10/01/2019,10:49 AM    Estill Bamberg L. Tivis Ringer, [place] Office number [phone] Pager [phone]

## 2019-10-01 NOTE — Procedures (Signed)
Patient Name: Brittney Kennedy  MRN: [id number]  Epilepsy Attending: Charlsie Quest  Referring Physician/Provider: Annie Main, NP Date: 10/01/2019 Duration: 24.40 minutes  Patient history: 69 year old female presented with acute left-sided weakness and facial droop.  CT head showed right parietal lobe infarct.  EEG to evaluate for seizures.  Level of alertness: awake  AEDs during EEG study: None  Technical aspects: This EEG study was done with scalp electrodes positioned according to the 10-20 International system of electrode placement. Electrical activity was acquired at a sampling rate of 500Hz  and reviewed with a high frequency filter of 70Hz  and a low frequency filter of 1Hz . EEG data were recorded continuously and digitally stored.   Description: During awake state, no clear posterior dominant rhythm was seen.  EEG showed continuous 2 to 3 Hz amplitude delta slowing in the right hemisphere as well as 3 to 6 Hz theta-delta slowing in left hemisphere.  Hyperventilation and photic stimulation were not performed.  Abnormality -Continuous slow, generalized and lateralized right hemisphere  IMPRESSION: This study is suggestive of cortical dysfunction in the right hemisphere likely secondary to underlying infarct as well as moderate to severe diffuse encephalopathy, nonspecific to etiology. No seizures or epileptiform discharges were seen throughout the recording.  Aldric Wenzler 

## 2019-10-01 NOTE — Progress Notes (Addendum)
Advanced Heart Failure Rounding Note   Subjective:    Events  - cath 09/27/19 3v CAD. EF 20% - right groin hematoma. U/s 3/27 negative for PSA - 3/28 altered mental status due to pain meds. Head CT, ABG and ammonia normal - 09/30/19 CVA, ACS with DES LAD and mid CX - 3/30 Headache with stat CT.    Overnight complaining of headache. Able to nod appropriately. Denies chest pain.    Objective:   Weight Range:  Vital Signs:   Temp:  [97.4 F (36.3 C)-98.8 F (37.1 C)] 98.8 F (37.1 C) (03/30 0400) Pulse Rate:  [81-110] 109 (03/30 0730) Resp:  [7-27] 20 (03/30 0730) BP: (118-151)/(51-101) 143/61 (03/30 0730) SpO2:  [97 %-100 %] 100 % (03/30 0730) Last BM Date: 09/26/19  Weight change: Filed Weights   09/28/19 0407 09/29/19 0533 09/30/19 0421  Weight: 74.2 kg 75.3 kg 72.9 kg    Intake/Output:   Intake/Output Summary (Last 24 hours) at 10/01/2019 0737 Last data filed at 10/01/2019 0600 Gross per 24 hour  Intake --  Output 350 ml  Net -350 ml     Physical Exam: General: Pale  HEENT: normal Neck: supple. no JVD. Carotids 2+ bilat; no bruits. No lymphadenopathy or thryomegaly appreciated. Cor: PMI nondisplaced. Regular rate & rhythm. No rubs, gallops or murmurs. Lungs: clear Abdomen: soft, nontender, nondistended. No hepatosplenomegaly. No bruits or masses. Good bowel sounds. Extremities: no cyanosis, clubbing, rash, edema Neuro: Speech garbled.  LUE weakness. L facial droop. .    Telemetry: Sinus Tach 100s  Labs: Basic Metabolic Panel: Recent Labs  Lab 09/27/19 0428 09/27/19 0428 09/28/19 0408 09/28/19 0408 09/29/19 0526 09/30/19 0351 10/01/19 0306  NA 143  --  142  --  140 141 144  K 3.5  --  3.2*  --  3.5 3.7 4.3  CL 113*  --  106  --  107 107 111  CO2 16*  --  16*  --  21* 20* 17*  GLUCOSE 112*  --  107*  --  150* 161* 118*  BUN 10  --  10  --  14 12 14   CREATININE 0.71  --  0.83  --  0.75 0.90 1.11*  CALCIUM 8.6*   < > 8.9   < > 8.7* 8.8* 9.1   MG  --   --  1.4*  --  2.4  --   --    < > = values in this interval not displayed.    Liver Function Tests: Recent Labs  Lab 09/26/19 1647 09/29/19 1112  AST 68* 40  ALT 30 19  ALKPHOS 85 75  BILITOT 1.2 1.0  PROT 6.3* 4.9*  ALBUMIN 3.2* 2.3*   Recent Labs  Lab 09/26/19 1647  LIPASE 25   Recent Labs  Lab 09/29/19 1112  AMMONIA 16    CBC: Recent Labs  Lab 09/27/19 0428 09/28/19 0408 09/29/19 0526 09/30/19 0351 10/01/19 0306  WBC 5.1 10.3 9.6 8.1 11.3*  NEUTROABS 2.9 6.6  --   --   --   HGB 13.3 14.5 11.7* 11.2* 10.9*  HCT 39.7 43.5 35.0* 33.5* 33.6*  MCV 94.5 94.2 94.1 94.6 96.8  PLT 214 240 262 234 324    Cardiac Enzymes: No results for input(s): CKTOTAL, CKMB, CKMBINDEX, TROPONINI in the last 168 hours.  BNP: BNP (last 3 results) No results for input(s): BNP in the last 8760 hours.  ProBNP (last 3 results) No results for input(s): PROBNP in the last 8760  hours.    Other results:  Imaging: CT ANGIO HEAD W OR WO CONTRAST  Result Date: 09/30/2019 CLINICAL DATA:  Stroke. Left facial droop and left arm weakness. Slurred speech. EXAM: CT ANGIOGRAPHY HEAD AND NECK CT PERFUSION BRAIN TECHNIQUE: Multidetector CT imaging of the head and neck was performed using the standard protocol during bolus administration of intravenous contrast. Multiplanar CT image reconstructions and MIPs were obtained to evaluate the vascular anatomy. Carotid stenosis measurements (when applicable) are obtained utilizing NASCET criteria, using the distal internal carotid diameter as the denominator. Multiphase CT imaging of the brain was performed following IV bolus contrast injection. Subsequent parametric perfusion maps were calculated using RAPID software. CONTRAST:  117mL OMNIPAQUE IOHEXOL 350 MG/ML SOLN COMPARISON:  CT head 09/30/2019 FINDINGS: CTA NECK FINDINGS Aortic arch: Atherosclerotic aortic arch. Three-vessel arch. Proximal great vessels widely patent. Right carotid system:  Atherosclerotic calcification distal right common carotid artery without significant stenosis. Atherosclerotic disease right carotid bifurcation without significant stenosis. Tortuous right internal carotid artery. Left carotid system: Mild atherosclerotic disease left common carotid artery without significant stenosis. Left carotid bifurcation widely patent without stenosis. Vertebral arteries: Left vertebral artery dominant. Both vertebral arteries widely patent to the basilar without stenosis. Skeleton: No acute skeletal abnormality. Cervical spine degenerative changes relatively mild. Other neck: Negative for mass or adenopathy. 1 cm right thyroid nodule. No further imaging necessary. Upper chest: Lung apices clear bilaterally. Small left pleural effusion. Review of the MIP images confirms the above findings CTA HEAD FINDINGS Anterior circulation: Cavernous carotid patent bilaterally without significant stenosis. Moderate stenosis distal right M1 segment. Moderate to severe focal stenosis inferior division of right middle cerebral artery. This corresponds to the area of decreased perfusion on the perfusion study. No vessel occlusion or thrombus identified. Both anterior cerebral arteries patent without stenosis. Left MCA patent without significant stenosis. Posterior circulation: Both vertebral arteries patent to the basilar. PICA patent bilaterally. Basilar widely patent. Superior cerebellar and posterior cerebral arteries patent bilaterally. Mild to moderate stenosis proximal right posterior cerebral artery. Moderate stenosis distal right posterior cerebral artery. Fetal origin of the left posterior cerebral artery. Venous sinuses: Normal venous enhancement. Anatomic variants: None Review of the MIP images confirms the above findings CT Brain Perfusion Findings: ASPECTS: 9 CBF (<30%) Volume: 78mL Perfusion (Tmax>6.0s) volume: 45mL Mismatch Volume: 68mL Infarction Location:Right parietal lobe. IMPRESSION: 1. 29  mL of delayed perfusion right parietal lobe. No fixed infarct on CT perfusion. 2. Moderate stenosis distal right M1 segment. Moderate to severe stenosis inferior branch of right middle cerebral artery. No large vessel occlusion. 3. Moderate stenosis right PCA proximally and distally. 4. No significant carotid or vertebral artery stenosis in the neck. 5. These results were called by telephone at the time of interpretation on 09/30/2019 at 1:50 pm to provider Lindzen, who verbally acknowledged these results. Electronically Signed   By: Franchot Gallo M.D.   On: 09/30/2019 13:51   CT HEAD WO CONTRAST  Result Date: 10/01/2019 CLINICAL DATA:  Stroke follow-up. Lethargy and confusion with right temporal headache. EXAM: CT HEAD WITHOUT CONTRAST TECHNIQUE: Contiguous axial images were obtained from the base of the skull through the vertex without intravenous contrast. COMPARISON:  CT and CTA from yesterday FINDINGS: Brain: Loss of gray-white differentiation centered in the right parietal lobe correlating with the perfusion defect on prior. No hemorrhagic conversion. There has been age-indeterminate infarction of the right caudate head, stable from yesterday. No hydrocephalus or masslike finding Vascular: No hyperdense vessel. Skull: Normal. Negative for fracture or focal  lesion. Sinuses/Orbits: No acute finding Other: Motion degraded IMPRESSION: 1. Acute infarct centered in the right parietal lobe correlating with perfusion defects yesterday. 2. Age-indeterminate infarct at the right caudate that is stable from yesterday. 3. Motion degraded Electronically Signed   By: Marnee Spring M.D.   On: 10/01/2019 06:56   CT HEAD WO CONTRAST  Result Date: 09/30/2019 CLINICAL DATA:  Change in mental status with left arm weakness EXAM: CT HEAD WITHOUT CONTRAST TECHNIQUE: Contiguous axial images were obtained from the base of the skull through the vertex without intravenous contrast. COMPARISON:  Head CT from yesterday FINDINGS:  Brain: Low-density in the right caudate head and anterior body, also involving the upper right putamen. There is also indistinct appearance to the left putamen. Low-density in the right pons with fairly defined and flat appearance on reformats. No visible cortex infarct. No hemorrhage, hydrocephalus, or masslike finding. Vascular: No hyperdense vessel Skull: Negative Sinuses/Orbits: Anterior septal defect without nodularity. Other: These results will be called to the ordering clinician or representative by the Radiologist Assistant, and communication documented in the PACS or [employer]. IMPRESSION: 1. Right basal ganglia infarct, likely recent given the history. 2. Ischemic change also seen at the left putamen, age indeterminate by imaging. 3. Small remote appearing right pontine infarct. Electronically Signed   By: Marnee Spring M.D.   On: 09/30/2019 04:59   CT HEAD WO CONTRAST  Result Date: 09/29/2019 CLINICAL DATA:  Altered mental status EXAM: CT HEAD WITHOUT CONTRAST TECHNIQUE: Contiguous axial images were obtained from the base of the skull through the vertex without intravenous contrast. COMPARISON:  None. FINDINGS: Brain: No evidence of acute infarction, hemorrhage, extra-axial collection, ventriculomegaly, or mass effect. Generalized cerebral atrophy. Periventricular white matter low attenuation likely secondary to microangiopathy. Vascular: Cerebrovascular atherosclerotic calcifications are noted. Skull: Negative for fracture or focal lesion. Sinuses/Orbits: Visualized portions of the orbits are unremarkable. Visualized portions of the paranasal sinuses are unremarkable. Visualized portions of the mastoid air cells are unremarkable. Other: None. IMPRESSION: 1. No acute intracranial pathology. 2. Chronic microvascular disease and cerebral atrophy. Electronically Signed   By: Elige Ko   On: 09/29/2019 10:45   CT ANGIO NECK W OR WO CONTRAST  Result Date: 09/30/2019 CLINICAL DATA:  Stroke.  Left facial droop and left arm weakness. Slurred speech. EXAM: CT ANGIOGRAPHY HEAD AND NECK CT PERFUSION BRAIN TECHNIQUE: Multidetector CT imaging of the head and neck was performed using the standard protocol during bolus administration of intravenous contrast. Multiplanar CT image reconstructions and MIPs were obtained to evaluate the vascular anatomy. Carotid stenosis measurements (when applicable) are obtained utilizing NASCET criteria, using the distal internal carotid diameter as the denominator. Multiphase CT imaging of the brain was performed following IV bolus contrast injection. Subsequent parametric perfusion maps were calculated using RAPID software. CONTRAST:  OMNIPAQUE IOHEXOL 350 MG/ML SOLN COMPARISON:  CT head 09/30/2019 FINDINGS: CTA NECK FINDINGS Aortic arch: Atherosclerotic aortic arch. Three-vessel arch. Proximal great vessels widely patent. Right carotid system: Atherosclerotic calcification distal right common carotid artery without significant stenosis. Atherosclerotic disease right carotid bifurcation without significant stenosis. Tortuous right internal carotid artery. Left carotid system: Mild atherosclerotic disease left common carotid artery without significant stenosis. Left carotid bifurcation widely patent without stenosis. Vertebral arteries: Left vertebral artery dominant. Both vertebral arteries widely patent to the basilar without stenosis. Skeleton: No acute skeletal abnormality. Cervical spine degenerative changes relatively mild. Other neck: Negative for mass or adenopathy. 1 cm right thyroid nodule. No further imaging necessary. Upper chest: Lung  apices clear bilaterally. Small left pleural effusion. Review of the MIP images confirms the above findings CTA HEAD FINDINGS Anterior circulation: Cavernous carotid patent bilaterally without significant stenosis. Moderate stenosis distal right M1 segment. Moderate to severe focal stenosis inferior division of right middle  cerebral artery. This corresponds to the area of decreased perfusion on the perfusion study. No vessel occlusion or thrombus identified. Both anterior cerebral arteries patent without stenosis. Left MCA patent without significant stenosis. Posterior circulation: Both vertebral arteries patent to the basilar. PICA patent bilaterally. Basilar widely patent. Superior cerebellar and posterior cerebral arteries patent bilaterally. Mild to moderate stenosis proximal right posterior cerebral artery. Moderate stenosis distal right posterior cerebral artery. Fetal origin of the left posterior cerebral artery. Venous sinuses: Normal venous enhancement. Anatomic variants: None Review of the MIP images confirms the above findings CT Brain Perfusion Findings: ASPECTS: 9 CBF (<30%) Volume: 0mL Perfusion (Tmax>6.0s) volume: 29mL Mismatch Volume: 29mL Infarction Location:Right parietal lobe. IMPRESSION: 1. 29 mL of delayed perfusion right parietal lobe. No fixed infarct on CT perfusion. 2. Moderate stenosis distal right M1 segment. Moderate to severe stenosis inferior branch of right middle cerebral artery. No large vessel occlusion. 3. Moderate stenosis right PCA proximally and distally. 4. No significant carotid or vertebral artery stenosis in the neck. 5. These results were called by telephone at the time of interpretation on 09/30/2019 at 1:50 pm to provider Lindzen, who verbally acknowledged these results. Electronically Signed   By: Marlan Palau M.D.   On: 09/30/2019 13:51   CARDIAC CATHETERIZATION  Result Date: 09/30/2019  Proximal LAD is 90% stenosed  A drug-eluting stent was successfully placed using a STENT RESOLUTE ONYX 3.0X12.  Post intervention, there is a 0% residual stenosis.  Mid Cx lesion is 95% stenosed.  A drug-eluting stent was successfully placed using a STENT RESOLUTE ONYX 2.5X26.  Post intervention, there is a 0% residual stenosis.  1. Successful PCI of the proximal LAD with DES x 1 2. Successful  PCI of the mid LCx with DES x 1. Plan: DAPT for one year. Will continue IV Cangrelor until NG in place and patient can be loaded with oral Plavix.   CT CEREBRAL PERFUSION W CONTRAST  Result Date: 09/30/2019 CLINICAL DATA:  Stroke. Left facial droop and left arm weakness. Slurred speech. EXAM: CT ANGIOGRAPHY HEAD AND NECK CT PERFUSION BRAIN TECHNIQUE: Multidetector CT imaging of the head and neck was performed using the standard protocol during bolus administration of intravenous contrast. Multiplanar CT image reconstructions and MIPs were obtained to evaluate the vascular anatomy. Carotid stenosis measurements (when applicable) are obtained utilizing NASCET criteria, using the distal internal carotid diameter as the denominator. Multiphase CT imaging of the brain was performed following IV bolus contrast injection. Subsequent parametric perfusion maps were calculated using RAPID software. CONTRAST:  OMNIPAQUE IOHEXOL 350 MG/ML SOLN COMPARISON:  CT head 09/30/2019 FINDINGS: CTA NECK FINDINGS Aortic arch: Atherosclerotic aortic arch. Three-vessel arch. Proximal great vessels widely patent. Right carotid system: Atherosclerotic calcification distal right common carotid artery without significant stenosis. Atherosclerotic disease right carotid bifurcation without significant stenosis. Tortuous right internal carotid artery. Left carotid system: Mild atherosclerotic disease left common carotid artery without significant stenosis. Left carotid bifurcation widely patent without stenosis. Vertebral arteries: Left vertebral artery dominant. Both vertebral arteries widely patent to the basilar without stenosis. Skeleton: No acute skeletal abnormality. Cervical spine degenerative changes relatively mild. Other neck: Negative for mass or adenopathy. 1 cm right thyroid nodule. No further imaging necessary. Upper chest: Lung apices clear  bilaterally. Small left pleural effusion. Review of the MIP images confirms the  above findings CTA HEAD FINDINGS Anterior circulation: Cavernous carotid patent bilaterally without significant stenosis. Moderate stenosis distal right M1 segment. Moderate to severe focal stenosis inferior division of right middle cerebral artery. This corresponds to the area of decreased perfusion on the perfusion study. No vessel occlusion or thrombus identified. Both anterior cerebral arteries patent without stenosis. Left MCA patent without significant stenosis. Posterior circulation: Both vertebral arteries patent to the basilar. PICA patent bilaterally. Basilar widely patent. Superior cerebellar and posterior cerebral arteries patent bilaterally. Mild to moderate stenosis proximal right posterior cerebral artery. Moderate stenosis distal right posterior cerebral artery. Fetal origin of the left posterior cerebral artery. Venous sinuses: Normal venous enhancement. Anatomic variants: None Review of the MIP images confirms the above findings CT Brain Perfusion Findings: ASPECTS: 9 CBF (<30%) Volume: 28mL Perfusion (Tmax>6.0s) volume: 49mL Mismatch Volume: 65mL Infarction Location:Right parietal lobe. IMPRESSION: 1. 29 mL of delayed perfusion right parietal lobe. No fixed infarct on CT perfusion. 2. Moderate stenosis distal right M1 segment. Moderate to severe stenosis inferior branch of right middle cerebral artery. No large vessel occlusion. 3. Moderate stenosis right PCA proximally and distally. 4. No significant carotid or vertebral artery stenosis in the neck. 5. These results were called by telephone at the time of interpretation on 09/30/2019 at 1:50 pm to provider Lindzen, who verbally acknowledged these results. Electronically Signed   By: Marlan Palau M.D.   On: 09/30/2019 13:51   MR CARDIAC MORPHOLOGY W WO CONTRAST  Result Date: 09/30/2019 CLINICAL DATA:  Patient hx of CAD, HFrEF. Cardiac MR performed to evaluate viability EXAM: CARDIAC MRI TECHNIQUE: The patient was scanned on a 1.5 Tesla GE  magnet. A dedicated cardiac coil was used. Functional imaging was done using [employer] sequences. 2,3, and 4 chamber views were done to assess for RWMA's. Modified Simpson's rule using a short axis stack was used to calculate an ejection fraction on a dedicated work [employer]. The patient received cc of Gadavist. After 10 minutes inversion recovery sequences were used to assess for infiltration and scar tissue. Due to uncooperative patient, free breathing techniques implemented. CONTRAST:  7 cc  of Gadavist FINDINGS: 1. Normal left ventricular size and thickness. The systolic function is severely reduced (LVEF =29%). There is global hypokinesis. There is no late gadolinium enhancement in the left ventricular myocardium. LVEDV:171 ml LVESV: 121 ml SV: 50 ml CO: 3.98L/min Myocardial mass: 180g 2. Normal right ventricular size and thickness. RV systolic function is mild to moderately reduced (RVEF =37%). There is global hypokinesis. 3.  Left atrium is mildly dilated, normal right atrial size. 4. Normal size of the aortic root, ascending aorta and pulmonary artery. 5.  No significant valvular abnormalities. 6.  Normal pericardium. Mild circumferencial pericardial effusion. IMPRESSION: 1. Severely reduced LV systolic function, global hypokinesis, LVEF of 29% 2. No evidence of Scar/late gadolinium enhancement. Entire LV myocardium appears viable 3.  Mild to moderately reduced RVEF 4.  Mild circuferential pericardial effusion 5.  Normal LV and RV chamber sizes and thickness 6. Due to uncooperative patient, free breathing techniques implemented which hinders image quality. Electronically Signed   By: Debbe Odea M.D.   On: 09/30/2019 12:33     Medications:     Scheduled Medications: . aspirin  81 mg Oral Daily  . atorvastatin  80 mg Oral q1800  . Chlorhexidine Gluconate Cloth  6 each Topical Daily  . clopidogrel  75 mg Per  Tube Q breakfast  . digoxin  0.125 mg Oral Daily  . enoxaparin  (LOVENOX) injection  40 mg Subcutaneous Q24H  . potassium chloride  40 mEq Oral Daily  . sodium chloride flush  3 mL Intravenous Q12H  . sodium chloride flush  3 mL Intravenous Q12H    Infusions: . sodium chloride    . sodium chloride      PRN Medications: sodium chloride, sodium chloride, acetaminophen, nitroGLYCERIN, ondansetron (ZOFRAN) IV, sodium chloride flush, sodium chloride flush   Assessment/Plan:   1. CAD with NSTEMI - cath with severe diffuse 3v CAD particularly on left side with severe LV dysfunction. Ideally will need CABG, however in looking at her films I worry about the quality of her LAD target and also not sure she would benefit much from a graft to the RCA. Concerned about her mobility especially after recent fall an groin hematoma and low albumin CMRI- LVEF 29% RV 37% Global hypokinesis.  - continue ASA, statin  2. Acute systolic HF due to NICM - EF ~25% - Volume status stable.  - Off entresto, spiro, bb and allow permissive HTN.   3. R groin hematoma after fall - has hematoma at cath site.  - u/s negative for PSA - CT + hematoma. No mention of fracture but not completely imaged. - Hgb 14 -> 12->11.2 >10.0  - off heparin.  - ON lovenox for DVT prophylaxis.  4. Hypokalemia/Hypomag Hold spiro for now.    5. Severe protein calorie malnutrition  - albumin 2.3  6.  Physical deconditioning - consult PT/OT/Speech   7 . Stroke, Acute  -CT 3/28 No acute findings  -CT 3/29 at 0500 R basal infact, small r pontine infact.  -CT 3/30 - Acute infarct centered in the right parietal lobe correlating with perfusion defects yesterday.2. Age-indeterminate infarct at the right caudate that is stable from yesterday. - Neuro following.  - Consult PT/OT/Speech and CIR.   9. ACS  Take  Urgently to the cath lab 3/29.  - DES to prox LAD and mid CX - Loaded on plavix via NG  - Given CVA will need speech to assess. Suspect she will need Cortrak for meds.  - Continue  plavix, aspirin, statin.     Length of Stay: 4   Amy Clegg NP-C  10/01/2019, 7:37 AM  Advanced Heart Failure Team Pager [phone] (M-F; 7a - 4p)  Please contact [facility] for night-coverage after hours (4p -7a ) and weekends on [email or web address]  Agree with above  C/o HA early this am. Aphasia and left-sided weakness worse. Neurology contacted by nursing team. Underwent repeat head CT. With no change in R parietal infarct  This am she is awake but much more dense aphasia and left-sided weakness.   Underwent emergent stenting of LAD and LCX yesterday. Hs trop 592. Currently no CP. Received 600 plavix via NGT which was immediately removed due to discomfort.   On exam General:  Ill appearing. No resp difficulty HEENT: normal left facial droop  Neck: supple. no JVD. Carotids 2+ bilat; no bruits. No lymphadenopathy or thryomegaly appreciated. Cor: PMI nondisplaced. Regular rate & rhythm. No rubs, gallops or murmurs. Lungs: clear Abdomen: obese soft, nontender, nondistended. No hepatosplenomegaly. No bruits or masses. Good bowel sounds. Extremities: no cyanosis, clubbing, rash, edema + R groin ecchymosis Neuro: awake aphasic weak on left side  Neuro exam much worse tonight. Await Neurology f/u to help guide therapy. For now continue with permissive HTN.   Clearly unable to swallow.  Will need to replace NGT or get Cor-trak to give meds including plavix, asa and statin.  CRITICAL CARE Performed by: Arvilla Meres  Total critical care time: 35 minutes  Critical care time was exclusive of separately billable procedures and treating other patients.  Critical care was necessary to treat or prevent imminent or life-threatening deterioration.  Critical care was time spent personally by me (independent of midlevel providers or residents) on the following activities: development of treatment plan with patient and/or surrogate as well as nursing, discussions with consultants, evaluation of  patient's response to treatment, examination of patient, obtaining history from patient or surrogate, ordering and performing treatments and interventions, ordering and review of laboratory studies, ordering and review of radiographic studies, pulse oximetry and re-evaluation of patient's condition.  Arvilla Meres, MD  10:47 AM

## 2019-10-02 ENCOUNTER — Inpatient Hospital Stay (HOSPITAL_COMMUNITY): Payer: Medicare Other

## 2019-10-02 DIAGNOSIS — E440 Moderate protein-calorie malnutrition: Secondary | ICD-10-CM | POA: Diagnosis present

## 2019-10-02 DIAGNOSIS — I5021 Acute systolic (congestive) heart failure: Secondary | ICD-10-CM

## 2019-10-02 DIAGNOSIS — I63511 Cerebral infarction due to unspecified occlusion or stenosis of right middle cerebral artery: Secondary | ICD-10-CM

## 2019-10-02 LAB — BASIC METABOLIC PANEL
Anion gap: 17 — ABNORMAL HIGH (ref 5–15)
BUN: 17 mg/dL (ref 8–23)
CO2: 15 mmol/L — ABNORMAL LOW (ref 22–32)
Calcium: 8.8 mg/dL — ABNORMAL LOW (ref 8.9–10.3)
Chloride: 111 mmol/L (ref 98–111)
Creatinine, Ser: 1.47 mg/dL — ABNORMAL HIGH (ref 0.44–1.00)
GFR calc Af Amer: 42 mL/min — ABNORMAL LOW (ref >60)
GFR calc non Af Amer: 37 mL/min — ABNORMAL LOW (ref >60)
Glucose, Bld: 80 mg/dL (ref 70–99)
Potassium: 4.2 mmol/L (ref 3.5–5.1)
Sodium: 143 mmol/L (ref 135–145)

## 2019-10-02 LAB — MAGNESIUM: Magnesium: 1.8 mg/dL (ref 1.7–2.4)

## 2019-10-02 LAB — CBC
HCT: 32.2 % — ABNORMAL LOW (ref 36.0–46.0)
Hemoglobin: 10.1 g/dL — ABNORMAL LOW (ref 12.0–15.0)
MCH: 31.6 pg (ref 26.0–34.0)
MCHC: 31.4 g/dL (ref 30.0–36.0)
MCV: 100.6 fL — ABNORMAL HIGH (ref 80.0–100.0)
Platelets: 321 10*3/uL (ref 150–400)
RBC: 3.2 MIL/uL — ABNORMAL LOW (ref 3.87–5.11)
RDW: 13.7 % (ref 11.5–15.5)
WBC: 14.1 10*3/uL — ABNORMAL HIGH (ref 4.0–10.5)
nRBC: 0 % (ref 0.0–0.2)

## 2019-10-02 LAB — PHOSPHORUS: Phosphorus: 3.9 mg/dL (ref 2.5–4.6)

## 2019-10-02 LAB — ECHOCARDIOGRAM COMPLETE
Height: 62 in
Weight: 2419.77 oz

## 2019-10-02 LAB — GLUCOSE, CAPILLARY
Glucose-Capillary: 102 mg/dL — ABNORMAL HIGH (ref 70–99)
Glucose-Capillary: 157 mg/dL — ABNORMAL HIGH (ref 70–99)
Glucose-Capillary: 169 mg/dL — ABNORMAL HIGH (ref 70–99)
Glucose-Capillary: 85 mg/dL (ref 70–99)
Glucose-Capillary: 96 mg/dL (ref 70–99)

## 2019-10-02 MED ORDER — METOPROLOL TARTRATE 5 MG/5ML IV SOLN
2.5000 mg | Freq: Four times a day (QID) | INTRAVENOUS | Status: DC | PRN
  Administered 2019-10-03: 2.5 mg via INTRAVENOUS
  Filled 2019-10-02 (×2): qty 5

## 2019-10-02 NOTE — Procedures (Signed)
Cortrak  Person Inserting Tube:  King, Myrakle Wingler E, RD Tube Type:  Cortrak - 43 inches Tube Location:  Left nare Initial Placement:  Stomach Secured by: Bridle Technique Used to Measure Tube Placement:  Documented cm marking at nare/ corner of mouth Cortrak Secured At:  64 cm    Cortrak Tube Team Note:  Consult received to place a Cortrak feeding tube.   No x-ray is required. RN may begin using tube.   If the tube becomes dislodged please keep the tube and contact the Cortrak team at [email or web address] (password [email or web address]) for replacement.  If after hours and replacement cannot be delayed, place a NG tube and confirm placement with an abdominal x-ray.    Reaghan Kawa King, MS, RD, LDN Pager number available on Amion 

## 2019-10-02 NOTE — Progress Notes (Signed)
  Echocardiogram 2D Echocardiogram has been performed.  Brittney Kennedy 10/02/2019, 11:56 AM

## 2019-10-02 NOTE — Consult Note (Signed)
Physical Medicine and Rehabilitation Consult Reason for Consult: Left side weakness with facial droop Referring Physician: Dr. Haroldine Laws   HPI: Brittney Kennedy is a 69 y.o. right-handed female with unremarkable past medical history.  Per chart review patient independent prior to admission.  1 level home.  She lives in the [place] area with a friend.  Presented 09/27/2019 with intermittent nausea and vomiting over the past week as well as epigastric/substernal pain.  Admission chemistries potassium 3.0, troponin high-sensitivity 674, SARS coronavirus negative.  EKG question old septal infarct with lateral ST-T changes.  CT abdomen pelvis negative for acute intra-abdominal or pelvic pathology.  She was placed on intravenous heparin.  Cardiac catheterization revealed severe multivessel CAD with LV dysfunction ejection fraction 25% and patient was stented.    Acute systolic heart failure due to NICM and follow-up management per cardiology services.  Patient did develop right groin hematoma after catheterization and currently maintained on Lovenox as well as low-dose aspirin.  Neurology services consulted 09/30/2019 for lethargy new left facial droop left upper extremity weakness.  Cranial CT scan showed right basal ganglia infarction as well as small remote appearing right pontine infarct.  CT angiogram of the neck moderate stenosis distal right M1 segment.  Moderate to severe stenosis inferior branch of right middle cerebral artery.  No large vessel occlusion.  Follow-up CT of the head 10/01/2019 shows acute infarct right parietal lobe age-indeterminate infarct right caudate.  EEG negative for seizure.  Plavix was added to patient's prophylaxis for CVA.  Currently n.p.o. with alternative means of nutritional support.  Therapy evaluations completed with recommendations of physical medicine rehab consult.   Review of Systems  Unable to perform ROS: Acuity of condition   Past Medical History:    Diagnosis Date  . CAD (coronary artery disease)    a. 09/2019 NSTEMI/Cath: LM nl, LAD 90p, 70m diffuse mid-dist dzs, LCX 91mV groove/OM2, RI 90p, RCA dominant, nl, RPDA 9040mF 25%, glob HK.  . HMarland KitchenrEF (heart failure with reduced ejection fraction) ([facility]  a. 09/2019 LV gram: EF 25%, global HK.  . PMarland KitchenD (peptic ulcer disease)   . Seasonal allergies    Past Surgical History:  Procedure Laterality Date  . CHOLECYSTECTOMY    . CORONARY STENT INTERVENTION N/A 09/30/2019   Procedure: CORONARY STENT INTERVENTION;  Surgeon: JorMartiniqueeter M, MD;  Location: [facility];  Service: Cardiovascular;  Laterality: N/A;  . LEFT HEART CATH AND CORONARY ANGIOGRAPHY N/A 09/30/2019   Procedure: LEFT HEART CATH AND CORONARY ANGIOGRAPHY;  Surgeon: JorMartiniqueeter M, MD;  Location: [facility];  Service: Cardiovascular;  Laterality: N/A;  . LEFT HEART CATH AND CORS/GRAFTS ANGIOGRAPHY N/A 09/27/2019   Procedure: LEFT HEART CATH AND CORS/GRAFTS ANGIOGRAPHY;  Surgeon: GolMinna MerrittsD;  [facility];  Service: Cardiovascular;  Laterality: N/A;   Family History  Problem Relation Age of Onset  . Stroke Mother        died @ 89 16 Multiple myeloma Father        died @ 93 24 Multiple myeloma Sister    Social History:  reports that she has never smoked. She has never used smokeless tobacco. She reports that she does not drink alcohol or use drugs. Allergies:  Allergies  Allergen Reactions  . No [employer]alt[employer] Food Allergies Anaphylaxis and Hives    Raw onions   Medications Prior to Admission  Medication Sig Dispense Refill  . Ascorbic Acid (  VITAMIN C) 1000 MG tablet Take 1,000 mg by mouth daily.    . cholecalciferol (VITAMIN D3) 25 MCG (1000 UNIT) tablet Take 1,000 Units by mouth daily.    . Echinacea 125 MG CAPS Take 2 capsules by mouth in the morning and at bedtime.    Marland Kitchen loratadine (CLARITIN) 10 MG tablet Take 10 mg by mouth daily.    . Multiple Vitamin (MULTIVITAMIN) capsule  Take 1 capsule by mouth daily.      Home: Home Living Family/patient expects to be discharged to:: Private residence Living Arrangements: Non-relatives/Friends Available Help at Discharge: Friend(s) Type of Home: House Home Access: Stairs to enter Technical brewer of Steps: curb Entrance Stairs-Rails: None Home Layout: One level Bathroom Shower/Tub: Multimedia programmer: Standard Home Equipment: None  Functional History: Prior Function Level of Independence: Independent Comments: per chart pt has not been limited physically Functional Status:  Mobility: Bed Mobility Overal bed mobility: Needs Assistance Bed Mobility: Supine to Sit, Sit to Sidelying, Rolling Rolling: Mod assist Supine to sit: +2 for physical assistance, +2 for safety/equipment, Min assist Sit to sidelying: Max assist, +2 for physical assistance, +2 for safety/equipment General bed mobility comments: patient requires support for trunk to ascend and scoot towards EOB, returned to supine with support for trunk and LEs due to fatigue  Transfers Overall transfer level: Needs assistance Equipment used: 2 person hand held assist Transfers: Sit to/from Stand Sit to Stand: +2 safety/equipment, +2 physical assistance, Min assist General transfer comment: 2 Hand held assist to power up and steady from EOB; min assist +2 for side stepping to [facility] Ambulation/Gait Ambulation/Gait assistance: Min assist, +2 physical assistance, +2 safety/equipment Gait Distance (Feet): 9 Feet Assistive device: 2 person hand held assist Gait Pattern/deviations: Decreased stride length, Step-to pattern, Wide base of support General Gait Details: sidesteps on side of bed with pt following with small steps as cued, prompts to get to top of bed to sit down. Gait velocity: slow, controlled    ADL: ADL Overall ADL's : Needs assistance/impaired Grooming: Sitting, Moderate assistance Upper Body Bathing: Sitting, Moderate  assistance Lower Body Bathing: Sit to/from stand, Maximal assistance, +2 for physical assistance Upper Body Dressing : Moderate assistance, Sitting Lower Body Dressing: Maximal assistance, Sit to/from stand, +2 for physical assistance Toilet Transfer Details (indicate cue type and reason): simulated side stepping to [facility] with min assist +2 Functional mobility during ADLs: Minimal assistance, Moderate assistance, +2 for safety/equipment, +2 for physical assistance General ADL Comments: pt limited by L sided weakness, decreased activity tolerance and impaired balance  Cognition: Cognition Overall Cognitive Status: Difficult to assess Orientation Level: Oriented to person, Disoriented to place, Disoriented to time, Disoriented to situation Cognition Arousal/Alertness: Lethargic Behavior During Therapy: Flat affect Overall Cognitive Status: Difficult to assess Area of Impairment: Following commands, Problem solving, Awareness, Attention Current Attention Level: Focused Following Commands: Follows one step commands with increased time, Follows multi-step commands inconsistently Awareness: Emergent Problem Solving: Slow processing, Decreased initiation, Difficulty sequencing, Requires verbal cues General Comments: with increased time pt able to follow commands, pt difficult to undersatnd at times due to slurred speech; decreased problem solving  Difficult to assess due to: Impaired communication  Blood pressure (!) 101/49, pulse 86, temperature 99.6 F (37.6 C), resp. rate 20, height 5' 2"  (1.575 m), weight 68.6 kg, SpO2 100 %.  Pt somnolent but opens eyes to command, will attend briefly and follow simple commands on RIght side with MMT Anarthric General: No acute distress Mood and affect are appropriate  Heart: Regular rate and rhythm no rubs murmurs or extra sounds Lungs: Clear to auscultation, breathing unlabored, no rales or wheezes Abdomen: Positive bowel sounds, soft nontender to  palpation, nondistended Extremities: No clubbing, cyanosis, or edema Skin: No evidence of breakdown, no evidence of rash Neurologic:  motor strength is 4/5 in Right and 0/5 deltoid, bicep, tricep, grip,4/5 Right hip flexor, knee extensors, ankle dorsiflexor and plantar flexor 0/5 left HF, 2- KE (with HE synergy), 0/5 ankle DF/PF Tone mildly increased Left quad and hamstrings Sensory exam normal nods yes that she feels pinch on Left fingers and toes , LT testing Cerebellar pt could not cooperate with this Musculoskeletal: no pain with UE or LE ROM  Results for orders placed or performed during the hospital encounter of 09/27/19 (from the past 24 hour(s))  Glucose, capillary     Status: Abnormal   Collection Time: 10/01/19  7:47 AM  Result Value Ref Range   Glucose-Capillary 113 (H) 70 - 99 mg/dL  Magnesium     Status: None   Collection Time: 10/01/19 11:07 AM  Result Value Ref Range   Magnesium 1.8 1.7 - 2.4 mg/dL  Phosphorus     Status: None   Collection Time: 10/01/19 11:07 AM  Result Value Ref Range   Phosphorus 3.0 2.5 - 4.6 mg/dL  Magnesium     Status: None   Collection Time: 10/01/19  5:02 PM  Result Value Ref Range   Magnesium 1.9 1.7 - 2.4 mg/dL  Phosphorus     Status: None   Collection Time: 10/01/19  5:02 PM  Result Value Ref Range   Phosphorus 3.1 2.5 - 4.6 mg/dL   CT ANGIO HEAD W OR WO CONTRAST  Result Date: 09/30/2019 CLINICAL DATA:  Stroke. Left facial droop and left arm weakness. Slurred speech. EXAM: CT ANGIOGRAPHY HEAD AND NECK CT PERFUSION BRAIN TECHNIQUE: Multidetector CT imaging of the head and neck was performed using the standard protocol during bolus administration of intravenous contrast. Multiplanar CT image reconstructions and MIPs were obtained to evaluate the vascular anatomy. Carotid stenosis measurements (when applicable) are obtained utilizing NASCET criteria, using the distal internal carotid diameter as the denominator. Multiphase CT imaging of the  brain was performed following IV bolus contrast injection. Subsequent parametric perfusion maps were calculated using RAPID software. CONTRAST:  167m OMNIPAQUE IOHEXOL 350 MG/ML SOLN COMPARISON:  CT head 09/30/2019 FINDINGS: CTA NECK FINDINGS Aortic arch: Atherosclerotic aortic arch. Three-vessel arch. Proximal great vessels widely patent. Right carotid system: Atherosclerotic calcification distal right common carotid artery without significant stenosis. Atherosclerotic disease right carotid bifurcation without significant stenosis. Tortuous right internal carotid artery. Left carotid system: Mild atherosclerotic disease left common carotid artery without significant stenosis. Left carotid bifurcation widely patent without stenosis. Vertebral arteries: Left vertebral artery dominant. Both vertebral arteries widely patent to the basilar without stenosis. Skeleton: No acute skeletal abnormality. Cervical spine degenerative changes relatively mild. Other neck: Negative for mass or adenopathy. 1 cm right thyroid nodule. No further imaging necessary. Upper chest: Lung apices clear bilaterally. Small left pleural effusion. Review of the MIP images confirms the above findings CTA HEAD FINDINGS Anterior circulation: Cavernous carotid patent bilaterally without significant stenosis. Moderate stenosis distal right M1 segment. Moderate to severe focal stenosis inferior division of right middle cerebral artery. This corresponds to the area of decreased perfusion on the perfusion study. No vessel occlusion or thrombus identified. Both anterior cerebral arteries patent without stenosis. Left MCA patent without significant stenosis. Posterior circulation: Both vertebral arteries patent to the  basilar. PICA patent bilaterally. Basilar widely patent. Superior cerebellar and posterior cerebral arteries patent bilaterally. Mild to moderate stenosis proximal right posterior cerebral artery. Moderate stenosis distal right posterior  cerebral artery. Fetal origin of the left posterior cerebral artery. Venous sinuses: Normal venous enhancement. Anatomic variants: None Review of the MIP images confirms the above findings CT Brain Perfusion Findings: ASPECTS: 9 CBF (<30%) Volume: 60m Perfusion (Tmax>6.0s) volume: 244mMismatch Volume: 2938mnfarction Location:Right parietal lobe. IMPRESSION: 1. 29 mL of delayed perfusion right parietal lobe. No fixed infarct on CT perfusion. 2. Moderate stenosis distal right M1 segment. Moderate to severe stenosis inferior branch of right middle cerebral artery. No large vessel occlusion. 3. Moderate stenosis right PCA proximally and distally. 4. No significant carotid or vertebral artery stenosis in the neck. 5. These results were called by telephone at the time of interpretation on 09/30/2019 at 1:50 pm to provider Lindzen, who verbally acknowledged these results. Electronically Signed   By: ChaFranchot GalloD.   On: 09/30/2019 13:51   DG Abd 1 View  Result Date: 10/01/2019 CLINICAL DATA:  NG tube placement. EXAM: ABDOMEN - 1 VIEW COMPARISON:  None. FINDINGS: The NG tube tip is seen in the lower chest at the T10 level, likely in the distal esophagus. Bowel gas pattern is normal. Small amount of contrast in the kidneys and bladder. No acute bone abnormality. IMPRESSION: NG tube tip in the distal esophagus. Electronically Signed   By: JamLorriane ShireD.   On: 10/01/2019 12:34   CT HEAD WO CONTRAST  Result Date: 10/01/2019 CLINICAL DATA:  Stroke follow-up. Lethargy and confusion with right temporal headache. EXAM: CT HEAD WITHOUT CONTRAST TECHNIQUE: Contiguous axial images were obtained from the base of the skull through the vertex without intravenous contrast. COMPARISON:  CT and CTA from yesterday FINDINGS: Brain: Loss of gray-white differentiation centered in the right parietal lobe correlating with the perfusion defect on prior. No hemorrhagic conversion. There has been age-indeterminate infarction of  the right caudate head, stable from yesterday. No hydrocephalus or masslike finding Vascular: No hyperdense vessel. Skull: Normal. Negative for fracture or focal lesion. Sinuses/Orbits: No acute finding Other: Motion degraded IMPRESSION: 1. Acute infarct centered in the right parietal lobe correlating with perfusion defects yesterday. 2. Age-indeterminate infarct at the right caudate that is stable from yesterday. 3. Motion degraded Electronically Signed   By: JonMonte FantasiaD.   On: 10/01/2019 06:56   CT ANGIO NECK W OR WO CONTRAST  Result Date: 09/30/2019 CLINICAL DATA:  Stroke. Left facial droop and left arm weakness. Slurred speech. EXAM: CT ANGIOGRAPHY HEAD AND NECK CT PERFUSION BRAIN TECHNIQUE: Multidetector CT imaging of the head and neck was performed using the standard protocol during bolus administration of intravenous contrast. Multiplanar CT image reconstructions and MIPs were obtained to evaluate the vascular anatomy. Carotid stenosis measurements (when applicable) are obtained utilizing NASCET criteria, using the distal internal carotid diameter as the denominator. Multiphase CT imaging of the brain was performed following IV bolus contrast injection. Subsequent parametric perfusion maps were calculated using RAPID software. CONTRAST:  100m79mNIPAQUE IOHEXOL 350 MG/ML SOLN COMPARISON:  CT head 09/30/2019 FINDINGS: CTA NECK FINDINGS Aortic arch: Atherosclerotic aortic arch. Three-vessel arch. Proximal great vessels widely patent. Right carotid system: Atherosclerotic calcification distal right common carotid artery without significant stenosis. Atherosclerotic disease right carotid bifurcation without significant stenosis. Tortuous right internal carotid artery. Left carotid system: Mild atherosclerotic disease left common carotid artery without significant stenosis. Left carotid bifurcation widely patent without stenosis. Vertebral  arteries: Left vertebral artery dominant. Both vertebral  arteries widely patent to the basilar without stenosis. Skeleton: No acute skeletal abnormality. Cervical spine degenerative changes relatively mild. Other neck: Negative for mass or adenopathy. 1 cm right thyroid nodule. No further imaging necessary. Upper chest: Lung apices clear bilaterally. Small left pleural effusion. Review of the MIP images confirms the above findings CTA HEAD FINDINGS Anterior circulation: Cavernous carotid patent bilaterally without significant stenosis. Moderate stenosis distal right M1 segment. Moderate to severe focal stenosis inferior division of right middle cerebral artery. This corresponds to the area of decreased perfusion on the perfusion study. No vessel occlusion or thrombus identified. Both anterior cerebral arteries patent without stenosis. Left MCA patent without significant stenosis. Posterior circulation: Both vertebral arteries patent to the basilar. PICA patent bilaterally. Basilar widely patent. Superior cerebellar and posterior cerebral arteries patent bilaterally. Mild to moderate stenosis proximal right posterior cerebral artery. Moderate stenosis distal right posterior cerebral artery. Fetal origin of the left posterior cerebral artery. Venous sinuses: Normal venous enhancement. Anatomic variants: None Review of the MIP images confirms the above findings CT Brain Perfusion Findings: ASPECTS: 9 CBF (<30%) Volume: 13m Perfusion (Tmax>6.0s) volume: 22mMismatch Volume: 2990mnfarction Location:Right parietal lobe. IMPRESSION: 1. 29 mL of delayed perfusion right parietal lobe. No fixed infarct on CT perfusion. 2. Moderate stenosis distal right M1 segment. Moderate to severe stenosis inferior branch of right middle cerebral artery. No large vessel occlusion. 3. Moderate stenosis right PCA proximally and distally. 4. No significant carotid or vertebral artery stenosis in the neck. 5. These results were called by telephone at the time of interpretation on 09/30/2019 at 1:50  pm to provider Lindzen, who verbally acknowledged these results. Electronically Signed   By: ChaFranchot GalloD.   On: 09/30/2019 13:51   CARDIAC CATHETERIZATION  Result Date: 09/30/2019  Proximal LAD is 90% stenosed  A drug-eluting stent was successfully placed using a STENT RESOLUTE ONYX 3.0X12.  Post intervention, there is a 0% residual stenosis.  Mid Cx lesion is 95% stenosed.  A drug-eluting stent was successfully placed using a STENT RESOLUTE ONYX 2.5X26.  Post intervention, there is a 0% residual stenosis.  1. Successful PCI of the proximal LAD with DES x 1 2. Successful PCI of the mid LCx with DES x 1. Plan: DAPT for one year. Will continue IV Cangrelor until NG in place and patient can be loaded with oral Plavix.   CT CEREBRAL PERFUSION W CONTRAST  Result Date: 09/30/2019 CLINICAL DATA:  Stroke. Left facial droop and left arm weakness. Slurred speech. EXAM: CT ANGIOGRAPHY HEAD AND NECK CT PERFUSION BRAIN TECHNIQUE: Multidetector CT imaging of the head and neck was performed using the standard protocol during bolus administration of intravenous contrast. Multiplanar CT image reconstructions and MIPs were obtained to evaluate the vascular anatomy. Carotid stenosis measurements (when applicable) are obtained utilizing NASCET criteria, using the distal internal carotid diameter as the denominator. Multiphase CT imaging of the brain was performed following IV bolus contrast injection. Subsequent parametric perfusion maps were calculated using RAPID software. CONTRAST:  100m66mNIPAQUE IOHEXOL 350 MG/ML SOLN COMPARISON:  CT head 09/30/2019 FINDINGS: CTA NECK FINDINGS Aortic arch: Atherosclerotic aortic arch. Three-vessel arch. Proximal great vessels widely patent. Right carotid system: Atherosclerotic calcification distal right common carotid artery without significant stenosis. Atherosclerotic disease right carotid bifurcation without significant stenosis. Tortuous right internal carotid artery.  Left carotid system: Mild atherosclerotic disease left common carotid artery without significant stenosis. Left carotid bifurcation widely patent without stenosis. Vertebral arteries:  Left vertebral artery dominant. Both vertebral arteries widely patent to the basilar without stenosis. Skeleton: No acute skeletal abnormality. Cervical spine degenerative changes relatively mild. Other neck: Negative for mass or adenopathy. 1 cm right thyroid nodule. No further imaging necessary. Upper chest: Lung apices clear bilaterally. Small left pleural effusion. Review of the MIP images confirms the above findings CTA HEAD FINDINGS Anterior circulation: Cavernous carotid patent bilaterally without significant stenosis. Moderate stenosis distal right M1 segment. Moderate to severe focal stenosis inferior division of right middle cerebral artery. This corresponds to the area of decreased perfusion on the perfusion study. No vessel occlusion or thrombus identified. Both anterior cerebral arteries patent without stenosis. Left MCA patent without significant stenosis. Posterior circulation: Both vertebral arteries patent to the basilar. PICA patent bilaterally. Basilar widely patent. Superior cerebellar and posterior cerebral arteries patent bilaterally. Mild to moderate stenosis proximal right posterior cerebral artery. Moderate stenosis distal right posterior cerebral artery. Fetal origin of the left posterior cerebral artery. Venous sinuses: Normal venous enhancement. Anatomic variants: None Review of the MIP images confirms the above findings CT Brain Perfusion Findings: ASPECTS: 9 CBF (<30%) Volume: 64m Perfusion (Tmax>6.0s) volume: 293mMismatch Volume: 2978mnfarction Location:Right parietal lobe. IMPRESSION: 1. 29 mL of delayed perfusion right parietal lobe. No fixed infarct on CT perfusion. 2. Moderate stenosis distal right M1 segment. Moderate to severe stenosis inferior branch of right middle cerebral artery. No large  vessel occlusion. 3. Moderate stenosis right PCA proximally and distally. 4. No significant carotid or vertebral artery stenosis in the neck. 5. These results were called by telephone at the time of interpretation on 09/30/2019 at 1:50 pm to provider Lindzen, who verbally acknowledged these results. Electronically Signed   By: ChaFranchot GalloD.   On: 09/30/2019 13:51   MR CARDIAC MORPHOLOGY W WO CONTRAST  Result Date: 09/30/2019 CLINICAL DATA:  Patient hx of CAD, HFrEF. Cardiac MR performed to evaluate viability EXAM: CARDIAC MRI TECHNIQUE: The patient was scanned on a 1.5 Tesla GE magnet. A dedicated cardiac coil was used. Functional imaging was done using Fiesta sequences. 2,3, and 4 chamber views were done to assess for RWMA's. Modified Simpson's rule using a short axis stack was used to calculate an ejection fraction on a dedicated work staConservation officer, naturehe patient received cc of Gadavist. After 10 minutes inversion recovery sequences were used to assess for infiltration and scar tissue. Due to uncooperative patient, free breathing techniques implemented. CONTRAST:  7 cc  of Gadavist FINDINGS: 1. Normal left ventricular size and thickness. The systolic function is severely reduced (LVEF =29%). There is global hypokinesis. There is no late gadolinium enhancement in the left ventricular myocardium. LVEDV:171 ml LVESV: 121 ml SV: 50 ml CO: 3.98L/min Myocardial mass: 180g 2. Normal right ventricular size and thickness. RV systolic function is mild to moderately reduced (RVEF =37%). There is global hypokinesis. 3.  Left atrium is mildly dilated, normal right atrial size. 4. Normal size of the aortic root, ascending aorta and pulmonary artery. 5.  No significant valvular abnormalities. 6.  Normal pericardium. Mild circumferencial pericardial effusion. IMPRESSION: 1. Severely reduced LV systolic function, global hypokinesis, LVEF of 29% 2. No evidence of Scar/late gadolinium enhancement. Entire LV  myocardium appears viable 3.  Mild to moderately reduced RVEF 4.  Mild circuferential pericardial effusion 5.  Normal LV and RV chamber sizes and thickness 6. Due to uncooperative patient, free breathing techniques implemented which hinders image quality. Electronically Signed   By: BriClaud Kelp  On: 09/30/2019 12:33   EEG adult  Result Date: 10/01/2019 Brittney Havens, MD     10/01/2019  2:33 PM Patient Name: Brittney Kennedy MRN: [id number] Epilepsy Attending: Lora Kennedy Referring Physician/Provider: Burnetta Sabin, NP Date: 10/01/2019 Duration: 24.40 minutes Patient history: 69 year old female presented with acute left-sided weakness and facial droop.  CT head showed right parietal lobe infarct.  EEG to evaluate for seizures. Level of alertness: awake AEDs during EEG study: None Technical aspects: This EEG study was done with scalp electrodes positioned according to the 10-20 International system of electrode placement. Electrical activity was acquired at a sampling rate of 500Hz  and reviewed with a high frequency filter of 70Hz  and a low frequency filter of 1Hz . EEG data were recorded continuously and digitally stored. Description: During awake state, no clear posterior dominant rhythm was seen.  EEG showed continuous 2 to 3 Hz amplitude delta slowing in the right hemisphere as well as 3 to 6 Hz theta-delta slowing in left hemisphere.  Hyperventilation and photic stimulation were not performed. Abnormality -Continuous slow, generalized and lateralized right hemisphere IMPRESSION: This study is suggestive of cortical dysfunction in the right hemisphere likely secondary to underlying infarct as well as moderate to severe diffuse encephalopathy, nonspecific to etiology. No seizures or epileptiform discharges were seen throughout the recording. Brittney Kennedy     Assessment/Plan: Diagnosis: Large Right MCA infarct with left hemiplegia, neglect, anarthria, dysphagia and cognitive  deficits 1. Does the need for close, 24 hr/day medical supervision in concert with the patient's rehab needs make it unreasonable for this patient to be served in a less intensive setting? Potentially 2. Co-Morbidities requiring supervision/potential complications: Cardiomyopathy, CAD s/p STEMI 3. Due to bladder management, bowel management, safety, skin/wound care, disease management, medication administration, pain management and patient education, does the patient require 24 hr/day rehab nursing? Yes 4. Does the patient require coordinated care of a physician, rehab nurse, therapy disciplines of PT, OT, SLP to address physical and functional deficits in the context of the above medical diagnosis(es)? Potentially Addressing deficits in the following areas: balance, endurance, locomotion, strength, transferring, bowel/bladder control, bathing, dressing, feeding, grooming, toileting, cognition, swallowing and psychosocial support 5. Can the patient actively participate in an intensive therapy program of at least 3 hrs of therapy per day at least 5 days per week? No 6. The potential for patient to make measurable gains while on inpatient rehab is curently poor but will monitor for improvements 7. Anticipated functional outcomes upon discharge from inpatient rehab are n/a  with PT, n/a with OT, n/a with SLP. 8. Estimated rehab length of stay to reach the above functional goals is: NA 9. Anticipated discharge destination: Home vs SNF 10. Overall Rehab/Functional Prognosis: fair  RECOMMENDATIONS: This patient's condition is appropriate for continued rehabilitative care in the following setting: Currently not able to tolerate CIR, acute care PT, OT, SLP will cont to follow Patient has agreed to participate in recommended program. N/A Note that insurance prior authorization may be required for reimbursement for recommended care.  Comment: Discussed with Pt's son and son in law , admission coordinator to  follow  Brittney Parsons, PA-C 10/02/2019  "I have personally performed a face to face diagnostic evaluation of this patient.  Additionally, I have reviewed and concur with the physician assistant's documentation above."  Brittney Kennedy M.D. [facility] FAAPM&R (Neuromuscular Med) Diplomate Am Board of Electrodiagnostic Med Fellow Am Board of Interventional Pain

## 2019-10-02 NOTE — Evaluation (Signed)
Speech Language Pathology Evaluation Patient Details Name: Brittney Kennedy MRN: [id number] DOB: 25-Oct-1950 Today's Date: 10/02/2019 Time: 6546-5035 SLP Time Calculation (min) (ACUTE ONLY): 10 min  Problem List:  Patient Active Problem List   Diagnosis Date Noted  . Malnutrition of moderate degree 10/02/2019  . Cerebral embolism with cerebral infarction 10/01/2019  . CAD (coronary artery disease) 09/27/2019  . Acute systolic CHF (congestive heart failure) ([place]) 09/27/2019  . NSTEMI (non-ST elevated myocardial infarction) ([place]) 09/26/2019   Past Medical History:  Past Medical History:  Diagnosis Date  . CAD (coronary artery disease)    a. 09/2019 NSTEMI/Cath: LM nl, LAD 90p, 55m, diffuse mid-dist dzs, LCX 52m AV groove/OM2, RI 90p, RCA dominant, nl, RPDA 30m, EF 25%, glob HK.  Marland Kitchen HFrEF (heart failure with reduced ejection fraction) ([place])    a. 09/2019 LV gram: EF 25%, global HK.  Marland Kitchen PUD (peptic ulcer disease)   . Seasonal allergies    Past Surgical History:  Past Surgical History:  Procedure Laterality Date  . CHOLECYSTECTOMY    . CORONARY STENT INTERVENTION N/A 09/30/2019   Procedure: CORONARY STENT INTERVENTION;  Surgeon: Martinique, Peter M, MD;  Location: [place] [facility];  Service: Cardiovascular;  Laterality: N/A;  . LEFT HEART CATH AND CORONARY ANGIOGRAPHY N/A 09/30/2019   Procedure: LEFT HEART CATH AND CORONARY ANGIOGRAPHY;  Surgeon: Martinique, Peter M, MD;  Location: [place] [facility];  Service: Cardiovascular;  Laterality: N/A;  . LEFT HEART CATH AND CORS/GRAFTS ANGIOGRAPHY N/A 09/27/2019   Procedure: LEFT HEART CATH AND CORS/GRAFTS ANGIOGRAPHY;  Surgeon: Minna Merritts, MD;  Location: [place] [facility];  Service: Cardiovascular;  Laterality: N/A;   HPI:  69 y/o female with hx of CAD, cholecystectomy, presented to ED with nausea/vomiting, epigastric pain for 1 week intermittently.  Admitted for NSTEMI, cath 3/26 revealed severe multivessel CAD and transferred to [facility].  Fall 3/27  with R groin hematoma.  Found AMS 3/28 due to pain meds.  Rapid response 3/29 due to AMS and L facial droop/weakness. CT revealed right basal ganglia infarct.  3/29 pm with worsening deficits; STAT CT head with right parietal infarct.    Assessment / Plan / Recommendation Clinical Impression  Pt was seen for speech/language/cognitive evaluation. She was cooperative but her level of alertness was variable and the elevation was ultimately terminated due to pt's increasing difficulty maintaining alertness. The impact on her alertness on her performance is considered. She presented with moderate-severe dysarthria characterized by reduced articulatory precision and reduced vocal intensity which negatively impacted speech intelligibility at the word and phrase levels. She was able to follow 1-step commands and answer simple questions. However, she exhibited difficulty with complex auditory comprehension, attention, orientation, and memory. Skilled SLP services are clinically indicated at this time to further assess her cognitive-linguistic function and to initiate treatment as clinically indicated.     SLP Assessment  SLP Recommendation/Assessment: Patient needs continued Speech Lanaguage Pathology Services SLP Visit Diagnosis: Cognitive communication deficit (R41.841)    Follow Up Recommendations  Inpatient Rehab    Frequency and Duration min 3x week  2 weeks      SLP Evaluation Cognition  Overall Cognitive Status: Impaired/Different from baseline Arousal/Alertness: Lethargic Orientation Level: Oriented to person;Oriented to place;Disoriented to time Attention: Focused;Sustained Focused Attention: Impaired Focused Attention Impairment: Verbal basic Sustained Attention: Impaired Sustained Attention Impairment: Verbal basic;Verbal complex Memory: Impaired Memory Impairment: Retrieval deficit;Decreased recall of new information       Comprehension  Auditory Comprehension Overall Auditory  Comprehension: Other (  comment)(Difficult to assess) Yes/No Questions: Impaired Basic Biographical Questions: (5/5) Complex Questions: (3/5) Commands: Impaired One Step Basic Commands: (4/4) Two Step Basic Commands: Not tested Conversation: Simple Interfering Components: Attention;Other (comment)(Alertness) Visual Recognition/Discrimination Discrimination: Not tested Reading Comprehension Reading Status: Not tested    Expression Expression Primary Mode of Expression: Verbal Verbal Expression Overall Verbal Expression: Other (comment)(Difficult to assess) Automatic Speech: Name Level of Generative/Spontaneous Verbalization: Phrase Pragmatics: Impairment Impairments: Abnormal affect;Eye contact Interfering Components: Attention;Other (comment)(Alertness)   Oral / Motor  Oral Motor/Sensory Function Overall Oral Motor/Sensory Function: Mild impairment Facial Symmetry: Suspected CN VII (facial) dysfunction;Abnormal symmetry left Motor Speech Overall Motor Speech: Impaired Respiration: Impaired Level of Impairment: Sentence Phonation: Hoarse;Low vocal intensity Resonance: Within functional limits Articulation: Impaired Level of Impairment: Phrase Intelligibility: Intelligibility reduced Word: 75-100% accurate Phrase: 25-49% accurate Sentence: 25-49% accurate Conversation: Not tested Motor Planning: Witnin functional limits Motor Speech Errors: Aware;Consistent   Luretha Eberly I. Vear Clock, MS, CCC-SLP Acute Rehabilitation Services Office number [phone] Pager [phone]                    Scheryl Marten 10/02/2019, 9:54 AM

## 2019-10-02 NOTE — Progress Notes (Signed)
STROKE TEAM PROGRESS NOTE   INTERVAL HISTORY Patient continues to have dysphagia and had NG tube inserted yesterday which she pulled out.  She is presently getting core track tube placed by RN at bedside.  She continues to have facial and significant left upper extremity weakness.  Vitals:   10/02/19 1100 10/02/19 1143 10/02/19 1200 10/02/19 1300  BP: (!) 158/62  (!) 151/57 (!) 117/43  Pulse: 100  96 91  Resp: (!) 25  18 19   Temp:  97.7 F (36.5 C)    TempSrc:  Oral    SpO2: 100%  100% 100%  Weight:      Height:        CBC:  Recent Labs  Lab 09/27/19 0428 09/27/19 0428 09/28/19 0408 09/29/19 0526 10/01/19 0306 10/02/19 0604  WBC 5.1   < > 10.3   < > 11.3* 14.1*  NEUTROABS 2.9  --  6.6  --   --   --   HGB 13.3   < > 14.5   < > 10.9* 10.1*  HCT 39.7   < > 43.5   < > 33.6* 32.2*  MCV 94.5   < > 94.2   < > 96.8 100.6*  PLT 214   < > 240   < > 324 321   < > = values in this interval not displayed.    Basic Metabolic Panel:  Recent Labs  Lab 10/01/19 0306 10/01/19 1107 10/01/19 1702 10/02/19 0604  NA 144  --   --  143  K 4.3  --   --  4.2  CL 111  --   --  111  CO2 17*  --   --  15*  GLUCOSE 118*  --   --  80  BUN 14  --   --  17  CREATININE 1.11*  --   --  1.47*  CALCIUM 9.1  --   --  8.8*  MG  --    < > 1.9 1.8  PHOS  --    < > 3.1 3.9   < > = values in this interval not displayed.   Lipid Panel:     Component Value Date/Time   CHOL 99 10/01/2019 0306   TRIG 174 (H) 10/01/2019 0306   HDL 27 (L) 10/01/2019 0306   CHOLHDL 3.7 10/01/2019 0306   VLDL 35 10/01/2019 0306   LDLCALC 37 10/01/2019 0306   HgbA1c:  Lab Results  Component Value Date   HGBA1C 5.9 (H) 09/26/2019   Urine Drug Screen:     Component Value Date/Time   LABOPIA NONE DETECTED 09/29/2019 1433   COCAINSCRNUR NONE DETECTED 09/29/2019 1433   LABBENZ POSITIVE (A) 09/29/2019 1433   AMPHETMU NONE DETECTED 09/29/2019 1433   THCU NONE DETECTED 09/29/2019 1433   LABBARB NONE DETECTED  09/29/2019 1433    Alcohol Level No results found for: [facility]  IMAGING past 24 hours EEG adult  Result Date: 10/01/2019 Lora Havens, MD     10/01/2019  2:33 PM Patient Name: Brittney Kennedy MRN: [id number] Epilepsy Attending: Lora Havens Referring Physician/Provider: Burnetta Sabin, NP Date: 10/01/2019 Duration: 24.40 minutes Patient history: 69 year old female presented with acute left-sided weakness and facial droop.  CT head showed right parietal lobe infarct.  EEG to evaluate for seizures. Level of alertness: awake AEDs during EEG study: None Technical aspects: This EEG study was done with scalp electrodes positioned according to the 10-20 International system of electrode placement. Electrical activity was acquired at a  sampling rate of 500Hz  and reviewed with a high frequency filter of 70Hz  and a low frequency filter of 1Hz . EEG data were recorded continuously and digitally stored. Description: During awake state, no clear posterior dominant rhythm was seen.  EEG showed continuous 2 to 3 Hz amplitude delta slowing in the right hemisphere as well as 3 to 6 Hz theta-delta slowing in left hemisphere.  Hyperventilation and photic stimulation were not performed. Abnormality -Continuous slow, generalized and lateralized right hemisphere IMPRESSION: This study is suggestive of cortical dysfunction in the right hemisphere likely secondary to underlying infarct as well as moderate to severe diffuse encephalopathy, nonspecific to etiology. No seizures or epileptiform discharges were seen throughout the recording. Brittney Kennedy    PHYSICAL EXAM [name] not in distress. . Afebrile. Head is nontraumatic. Neck is supple without bruit.    Cardiac exam no murmur or gallop. Lungs are clear to auscultation. Distal pulses are well felt. Neurological Exam :  She is lethargic, drowsy but can be aroused with some difficulty.  She will respond to simple midline and one-step commands.  attention is diminished.  Speech is dysarthric and hypophonic but can be understood with some difficulty.  Right gaze preference.  Unable to look all the way to the left.  Blinks to threat on the right but not on the left.  Fundi not visualized.  Left lower facial weakness.  Tongue midline.  Motor system exam shows left upper extremity weakness 3/5 strength with more weakness distally in the grip and intrinsic hand muscles.  Mild left lower extremity weakness.  Normal strength on the right.  Sensation appears symmetric.  Plantars downgoing.  Gait not tested. ASSESSMENT/PLAN [name] is a 69 y.o. female with history of cholecystectomy and left rotator cuff repair presenting to [place]. [facility] 3/25 with intermittent N/V x 1 wk and intermittent epigastric pain x 2 weeks. Found to have a NSTEMI. Admitted for workup, which revealed CAD, HFrEF who developed AMS. CT head neg. Developed L facial droop, LUE weakness, dysarthria and LUE weakness. CT now w/ R basal ganglia infarct and possible L putamen abnormality d/t LVO. AMS felt d/t medication side effect.  .   Stroke:   R parietal infarct embolic in setting of acute MI s/p cath w stent placement  CT head 3/28 No acute abnormality. Small vessel disease. Atrophy.   CT head 3/29 R basal ganglia infarct. L putamen infarct, age indeterminate. Small old R pontine infarct   CTA head & neck distal R M1 moderate stenosis, R MCA branch inferior branch moderate to severe stenosis. Proximal and distal R PCA moderate stenosis.   CT perfusion 7ml penumbra R parietal love  CT head 3/30 R basal ganglia infarct. R caudate infarct.  2D Echo EF 25-30%.   EEG nonspecific R slowing, no sz  LDL 37  HgbA1c 5.9  Lovenox 40 mg sq daily for VTE prophylaxis  No antithrombotic prior to admission, now ordered aspirin 81 mg daily and clopidogrel 75 mg daily but unable to swallow. Changed aspirin to suppository and  Per Dr. 4/29 added IV heparin. If  pt can swallow or tube placed, ok to continue aspirin and plavix and stop IV heparin.   Therapy recommendations:  CIR  Disposition:  pending   CAD w/ NSTEMI  Severe diffuse 3v dz w/ severe LV dysfunction  Possible CABG  ACS  S/p stent placement proximal mid LAD and mid CX  Loaded on plavix  Acute systolic HF  d/t NICM  EF 25%  Off BP meds to allow for permissive HTN  R groin hematoma following fall  Hematoma at cath site  CT w. Hematoma  Off IV heparin  On lovenox  Tachycardia  Per Dr. Pearlean Brownie started metoprolol 2.5 q6h   Blood Pressure . Permissive hypertension (OK if < 220/120) but gradually normalize in 5-7 days . Long-term BP goal normotensive  Hyperlipidemia  Home meds:  No statin listed  LDL 37, goal < 70  Now on lipitor 80  Continue statin at discharge  Dysphagia Severe protein calorie malnutrition . Secondary to stroke . NPO . Speech consulted to assess  . Unable to swallow . Coretrak and TF ordered to start  Other Stroke Risk Factors  Advanced age  Overweight, Body mass index is 27.66 kg/m., recommend weight loss, diet and exercise as appropriate   Family hx stroke (mother)  New onset Congestive heart failure - HFrEF  Other Active Problems  Hypokalemia/hypomagnesemia    Hospital day # 5  Patient developed sudden onset of headache and left face and arm weakness secondary to right parietal embolic infarct in the setting of acute MI as well as cardiomyopathy with diminished ejection fraction.  Recommend continue aspirin and Plavix via core track tube as patient is unable to swallow yet.  Continue speech therapy for swallow eval and physical occupational therapy as well/Discussed with Dr. [name] plan of care discussed with nurse at the bedside and answered questions.  Greater than 50% time during this 25-minute visit was spent on counseling and coordination of care about her embolic stroke and discussion about evaluation treatment  and answering questions. Delia Heady, MD To contact Stroke Continuity provider, please refer to [email or web address]. After hours, contact General Neurology

## 2019-10-02 NOTE — Progress Notes (Signed)
Inpatient Rehabilitation-Admissions Coordinator   Please see consult note from PM&R MD, Dr. Letta Pate for details.I met with pt and her son bedside as follow up from consult.  Pt somnolent upon entering but able to follow commands for me on the right side. Pt kept eyes closed most of the conversation but was interacting some with her family bedside. AC discussed current recommendations from PM&R MD, stating that at this time, pt is not able to tolerate the intensity of CIR program but that we will follow for greater tolerance and participation over the next few days. They are aware that her dispo location will be determined based off of her tolerance level when deemed medically ready for DC as well as having a stable DC plan with 24/7 A. Family to continue to firm up a DC plan.   Family is requesting to speak with LCSW. AC will notify them of their request.   Raechel Ache, OTR/L  Rehab Admissions Coordinator  [phone] 10/02/2019 2:58 PM

## 2019-10-02 NOTE — Progress Notes (Signed)
Physical Therapy Treatment Patient Details Name: Brittney Kennedy MRN: [id number] DOB: 1951/02/11 Today's Date: 10/02/2019    History of Present Illness 69yo female with onset of N&V and epigastric pain intermittently for a week, admitted with NSTEMI, cath 3/26 shows severe multivessel CAD, to [place] 3.27 with groin hematom.  AMS 3/28 from pain meds, RR 3/29 with AMS and R facial droop.  New CT with  R basal ganglia infarct in light of CABG being planned.  Newest EF is 29%.   PMHx:  R pontine infarct, ischemic changes on CT L putamen, aortic atherosclerosis, CAD. cholecystectomy, PUD, CHF,     PT Comments    Pt lying in bed relatively lethargic on arrival.  She progressively aroused interacting with her son and son in law.  At EOB, pt spent approx 15 min working on balance, reaching for and using mouth swabs.  Afterward, pt transitioned sit to stand and transferred bed to chair with face to face assist.   Continue to recommend CIR.   Follow Up Recommendations  CIR     Equipment Recommendations  None recommended by PT    Recommendations for Other Services Rehab consult     Precautions / Restrictions Precautions Precautions: Fall Precaution Comments: pulls at NG tube Restrictions Weight Bearing Restrictions: No    Mobility  Bed Mobility Overal bed mobility: Needs Assistance Bed Mobility: Supine to Sit     Supine to sit: Max assist;+2 for physical assistance;+2 for safety/equipment     General bed mobility comments: max A +2 and cueing to guide BLEs to EOB And rise at trunk with support to bring hips to EOB  Transfers Overall transfer level: Needs assistance Equipment used: 2 person hand held assist Transfers: Sit to/from [employer] Sit to Stand: +2 safety/equipment;+2 physical assistance;Min assist Stand pivot transfers: Mod assist;+2 physical assistance;+2 safety/equipment       General transfer comment: min A +2 to rise and steady to stand; mod A +2 to  stand pivot to recliner.  pt need help pivoting R foot due to pt not offloading enough  Ambulation/Gait                 Stairs             Wheelchair Mobility    Modified Rankin (Stroke Patients Only) Modified Rankin (Stroke Patients Only) Pre-Morbid Rankin Score: No symptoms Modified Rankin: Moderately severe disability     Balance Overall balance assessment: Needs assistance Sitting-balance support: No upper extremity supported;Feet supported;Single extremity supported Sitting balance-Leahy Scale: Poor Sitting balance - Comments: min A to min guard   Standing balance support: Bilateral upper extremity supported;During functional activity Standing balance-Leahy Scale: Poor Standing balance comment: relaint on external support                            Cognition Arousal/Alertness: Lethargic Behavior During Therapy: Flat affect Overall Cognitive Status: Impaired/Different from baseline Area of Impairment: Attention;Following commands;Awareness;Problem solving                   Current Attention Level: Focused   Following Commands: Follows one step commands with increased time;Follows multi-step commands inconsistently   Awareness: Emergent Problem Solving: Slow processing;Decreased initiation;Difficulty sequencing;Requires verbal cues;Requires tactile cues General Comments: increased time and repitition to follow simple commands. Making gestures for water and shaking head yes/no at times      Exercises      General Comments General comments (skin integrity,  edema, etc.): son and son in law present to observe and assist      Pertinent Vitals/Pain Pain Assessment: No/denies pain Pain Intervention(s): Monitored during session    Home Living                      Prior Function            PT Goals (current goals can now be found in the care plan section) Acute Rehab PT Goals Patient Stated Goal: return to  independence PT Goal Formulation: With patient/family Time For Goal Achievement: 10/14/19 Potential to Achieve Goals: Good Progress towards PT goals: Progressing toward goals    Frequency    Min 3X/week      PT Plan Current plan remains appropriate    Co-evaluation PT/OT/SLP Co-Evaluation/Treatment: Yes Reason for Co-Treatment: For patient/therapist safety PT goals addressed during session: Mobility/safety with mobility OT goals addressed during session: ADL's and self-care      AM-PAC PT "6 Clicks" Mobility   Outcome Measure  Help needed turning from your back to your side while in a flat bed without using bedrails?: A Lot Help needed moving from lying on your back to sitting on the side of a flat bed without using bedrails?: A Lot Help needed moving to and from a bed to a chair (including a wheelchair)?: A Lot Help needed standing up from a chair using your arms (e.g., wheelchair or bedside chair)?: A Little Help needed to walk in hospital room?: A Lot Help needed climbing 3-5 steps with a railing? : Total 6 Click Score: 12    End of Session   Activity Tolerance: Patient limited by fatigue;Treatment limited secondary to medical complications (Comment) Patient left: with call bell/phone within reach;in chair;with family/visitor present Nurse Communication: Mobility status;Other (comment) PT Visit Diagnosis: Unsteadiness on feet (R26.81);Muscle weakness (generalized) (M62.81);Difficulty in walking, not elsewhere classified (R26.2);Hemiplegia and hemiparesis Hemiplegia - Right/Left: Left Hemiplegia - dominant/non-dominant: Non-dominant Hemiplegia - caused by: Cerebral infarction     Time: 1441-1530 PT Time Calculation (min) (ACUTE ONLY): 49 min  Charges:  $Neuromuscular Re-education: 8-22 mins                     10/02/2019  Ginger Carne., PT Acute Rehabilitation Services [phone]  (pager) [phone]  (office)   Brittney Kennedy 10/02/2019, 6:01 PM

## 2019-10-02 NOTE — Progress Notes (Addendum)
Occupational Therapy Treatment Patient Details Name: Brittney Kennedy MRN: [id number] DOB: May 02, 1951 Today's Date: 10/02/2019    History of present illness 69yo female with onset of N&V and epigastric pain intermittently for a week, admitted with NSTEMI, cath 3/26 shows severe multivessel CAD, to cone 3.27 with groin hematom.  AMS 3/28 from pain meds, RR 3/29 with AMS and R facial droop.  New CT with  R basal ganglia infarct in light of CABG being planned.  Newest EF is 29%.   PMHx:  R pontine infarct, ischemic changes on CT L putamen, aortic atherosclerosis, CAD. cholecystectomy, PUD, CHF,    OT comments  Pt making good progress toward OT goals. Focused session on OOB mobility with L sided neuro facilitation. Son and son in law present and supportive throughout session. Pt sat EOB with max A +2 and completed sit <> stand with mod A +2. Pt keeping eyes closed most of session unless otherwise cued. She continues to show L sided sensory and visual deficits. Pt was able to locate and grab mouth sponge on L side with R hand while turning head. Trace contraction noted in L trap and shoulder girdle. Pt continue to present with cognitive deficits described below. Pt able to tolerate ~15 minutes of sitting EOB this session followed by transfer to chair. Continue to recommend CIR for neuro facilitation and to progress independence in BADL. Will continue to follow.   Follow Up Recommendations  CIR;Supervision/Assistance - 24 hour    Equipment Recommendations  Other (comment)(TBD)    Recommendations for Other Services      Precautions / Restrictions Precautions Precautions: Fall Precaution Comments: pulls at NG tube Restrictions Weight Bearing Restrictions: No       Mobility Bed Mobility Overal bed mobility: Needs Assistance Bed Mobility: Supine to Sit     Supine to sit: Max assist;+2 for physical assistance;+2 for safety/equipment     General bed mobility comments: max A +2 and cueing to  guide BLEs to EOB And rise at trunk with support to bring hips to EOB  Transfers Overall transfer level: Needs assistance Equipment used: 2 person hand held assist Transfers: Sit to/from [facility] Sit to Stand: +2 safety/equipment;+2 physical assistance;Min assist Stand pivot transfers: Mod assist;+2 physical assistance;+2 safety/equipment       General transfer comment: min A +2 to rise and steady to stand; mod A +2 to stand pivot to recliner    Balance Overall balance assessment: Needs assistance Sitting-balance support: No upper extremity supported;Feet supported;Single extremity supported Sitting balance-Leahy Scale: Fair Sitting balance - Comments: min A to min guard   Standing balance support: Bilateral upper extremity supported;During functional activity Standing balance-Leahy Scale: Poor Standing balance comment: relaint on external support                           ADL either performed or assessed with clinical judgement   ADL Overall ADL's : Needs assistance/impaired                         Toilet Transfer: Moderate assistance;+2 for safety/equipment;Stand-pivot Toilet Transfer Details (indicate cue type and reason): simulated to recliner         Functional mobility during ADLs: Moderate assistance;+2 for safety/equipment;+2 for physical assistance(to recliner)       Vision Baseline Vision/History: Wears glasses Wears Glasses: At all times Patient Visual Report: No change from baseline Vision Assessment?: Yes Ocular Range of Motion: Restricted  on the left Alignment/Gaze Preference: Gaze right Tracking/Visual Pursuits: Impaired - to be further tested in functional context Visual Fields: Left visual field deficit Additional Comments: will continue to assess due to cognition and ability to follow commands and keep eyes open. Pt able to attend to sponge brush in L with head turning   Perception     Praxis       Cognition Arousal/Alertness: Lethargic Behavior During Therapy: Flat affect Overall Cognitive Status: Impaired/Different from baseline Area of Impairment: Attention;Following commands;Awareness;Problem solving                   Current Attention Level: Focused   Following Commands: Follows one step commands with increased time;Follows multi-step commands inconsistently   Awareness: Emergent Problem Solving: Slow processing;Decreased initiation;Difficulty sequencing;Requires verbal cues;Requires tactile cues General Comments: increased time and repitition to follow simple commands. Making gestures for water and shaking head yes/no at times        Exercises     Shoulder Instructions       General Comments      Pertinent Vitals/ Pain       Pain Assessment: No/denies pain  Home Living                                          Prior Functioning/Environment              Frequency  Min 2X/week        Progress Toward Goals  OT Goals(current goals can now be found in the care plan section)  Progress towards OT goals: Progressing toward goals  Acute Rehab OT Goals Patient Stated Goal: return to independence OT Goal Formulation: With patient/family Time For Goal Achievement: 10/14/19 Potential to Achieve Goals: Good  Plan Discharge plan remains appropriate;Frequency remains appropriate    Co-evaluation    PT/OT/SLP Co-Evaluation/Treatment: Yes Reason for Co-Treatment: For patient/therapist safety;To address functional/ADL transfers   OT goals addressed during session: ADL's and self-care;Strengthening/ROM      AM-PAC OT "6 Clicks" Daily Activity     Outcome Measure   Help from another person eating meals?: Total Help from another person taking care of personal grooming?: A Lot Help from another person toileting, which includes using toliet, bedpan, or urinal?: A Lot Help from another person bathing (including washing, rinsing,  drying)?: A Lot Help from another person to put on and taking off regular upper body clothing?: A Lot Help from another person to put on and taking off regular lower body clothing?: A Lot 6 Click Score: 11    End of Session    OT Visit Diagnosis: Other abnormalities of gait and mobility (R26.89);Muscle weakness (generalized) (M62.81);History of falling (Z91.81);Cognitive communication deficit (R41.841);Other symptoms and signs involving the nervous system (R29.898);Low vision, both eyes (H54.2) Symptoms and signs involving cognitive functions: Cerebral infarction   Activity Tolerance Patient tolerated treatment well   Patient Left in bed;with call bell/phone within reach;with bed alarm set;with family/visitor present;with chair alarm set   Nurse Communication Mobility status        Time: 1441-1530 OT Time Calculation (min): 49 min  Charges: OT General Charges $OT Visit: 1 Visit OT Treatments $Self Care/Home Management : 8-22 mins $Neuromuscular Re-education: 8-22 mins  Dalphine Handing, MSOT, OTR/L Acute Rehabilitation Services [facility] Office Number: [phone] Pager: [phone]  Dalphine Handing 10/02/2019, 5:48 PM

## 2019-10-02 NOTE — TOC Progression Note (Signed)
Transition of Care [facility]) - Progression Note    Patient Details  Name: Brittney Kennedy MRN: [id number] Date of Birth: [birth year]  [facility]) CM/SW Contact  Levada Schilling Phone Number: 10/02/2019, 3:51 PM  Clinical Narrative:    CSW was consulted by Tresa Endo OTR/L to speak with family on possible discharge planning to SNF due to pt not able to tolerate the intensity of CIR program CIR will follow for greater tolerance and participation over the next few days. Pt's son was given a list for SNF from medicare.gov to review.  CSW provided son with medicare coverage.  CSW will continue to follow for disposition planning.       Expected Discharge Plan and Services                                                 Social Determinants of Health (SDOH) Interventions    Readmission Risk Interventions No flowsheet data found.

## 2019-10-02 NOTE — Progress Notes (Signed)
Patient restrained and had mittens on after son left for the night. While RN was in other patient's room, patient took off her mitten on her right hand and was successful in pulling her Cortrak out.   Dr. [name] made aware and son was called and notified. Son was pleasant over the phone and thanked Charity fundraiser for updating.   Tube feeds turned off for the night, will re-assess in the AM.   RN will continue to monitor.  Brittney Kennedy Brittney Kennedy

## 2019-10-02 NOTE — Progress Notes (Signed)
  Speech Language Pathology Treatment: Dysphagia  Patient Details Name: Brittney Kennedy MRN: [id number] DOB: 08/22/1950 Today's Date: 10/02/2019 Time: 4081-4481 SLP Time Calculation (min) (ACUTE ONLY): 8 min  Assessment / Plan / Recommendation Clinical Impression  Pt was seen for dysphagia treatment to assess improvement in swallow function. She was cooperative throughout the session with variable levels of alertness. Pt tolerated ice chips and puree solids without overt s/sx of aspiration. However, she required mod verbal cues for bolus manipulation and inconsistently for deglutition. Mild lingual oral residue was noted with puree and which was cleared with secondary swallows. Pt is at moderate-high risk for aspiration at this time due to her variable level of alertness and reduced bolus awareness. It is recommended that the pt's NPO status be maintained and that short-term non-oral alimentation be initiated. SLP will continue to follow pt.    HPI HPI: 69 y/o female with hx of CAD, cholecystectomy, presented to ED with nausea/vomiting, epigastric pain for 1 week intermittently.  Admitted for NSTEMI, cath 3/26 revealed severe multivessel CAD and transferred to [facility].  Fall 3/27 with R groin hematoma.  Found AMS 3/28 due to pain meds.  Rapid response 3/29 due to AMS and L facial droop/weakness. CT revealed right basal ganglia infarct.  3/29 pm with worsening deficits; STAT CT head with right parietal infarct.       SLP Plan  Continue with current plan of care       Recommendations  Diet recommendations: NPO Medication Administration: Via alternative means                Oral Care Recommendations: Oral care QID;Oral care prior to ice chip/H20 Follow up Recommendations: Other (comment)(tba) SLP Visit Diagnosis: Dysphagia, oropharyngeal phase (R13.12) Plan: Continue with current plan of care       Aracelie Addis I. Vear Clock, MS, CCC-SLP Acute Rehabilitation Services Office number  [phone] Pager [phone]      Scheryl Marten 10/02/2019, 9:40 AM

## 2019-10-02 NOTE — Progress Notes (Addendum)
Advanced Heart Failure Rounding Note   Subjective:    Events  - cath 09/27/19 3v CAD. EF 20% - right groin hematoma. U/s 3/27 negative for PSA - 3/28 altered mental status due to pain meds. Head CT, ABG and ammonia normal - 09/30/19 CVA, ACS with DES LAD and mid CX - 3/30 Headache with stat CT. EEG- no seizure, cortical dysfunction in the right hemisphere likely secondary to underlying infarct as well as moderate to severe diffuse encephalopathy, nonspecific to etiology.   No changes over night. Slept some over night.    Objective:   Weight Range:  Vital Signs:   Temp:  [98.3 F (36.8 C)-101.3 F (38.5 C)] 98.3 F (36.8 C) (03/31 0747) Pulse Rate:  [80-113] 80 (03/31 0600) Resp:  [15-27] 15 (03/31 0600) BP: (96-164)/(43-74) 125/68 (03/31 0600) SpO2:  [97 %-100 %] 100 % (03/31 0600) Weight:  [68.6 kg] 68.6 kg (03/31 0500) Last BM Date: 10/01/19  Weight change: Filed Weights   09/29/19 0533 09/30/19 0421 10/02/19 0500  Weight: 75.3 kg 72.9 kg 68.6 kg    Intake/Output:   Intake/Output Summary (Last 24 hours) at 10/02/2019 0751 Last data filed at 10/01/2019 1200 Gross per 24 hour  Intake --  Output 100 ml  Net -100 ml     Physical Exam: General:  Pale. Drowsy  No resp difficulty HEENT: normal Neck: supple. no JVD. Carotids 2+ bilat; no bruits. No lymphadenopathy or thryomegaly appreciated. Cor: PMI nondisplaced. Regular rate & rhythm. No rubs, gallops or murmurs. Lungs: clear Abdomen: soft, nontender, nondistended. No hepatosplenomegaly. No bruits or masses. Good bowel sounds. Extremities: no cyanosis, clubbing, rash, edema. R groin ecchymotic. Soft mittens on R and L hand.  Neuro: L facial droop. LUE flaccid. LLE weak.   Arouses and responds to her name.   Telemetry: Labs: Basic Metabolic Panel: Recent Labs  Lab 09/28/19 0408 09/28/19 0408 09/29/19 4098 09/29/19 1191 09/30/19 0351 10/01/19 0306 10/01/19 1107 10/01/19 1702 10/02/19 0604  NA 142  --   140  --  141 144  --   --  143  K 3.2*  --  3.5  --  3.7 4.3  --   --  4.2  CL 106  --  107  --  107 111  --   --  111  CO2 16*  --  21*  --  20* 17*  --   --  15*  GLUCOSE 107*  --  150*  --  161* 118*  --   --  80  BUN 10  --  14  --  12 14  --   --  17  CREATININE 0.83  --  0.75  --  0.90 1.11*  --   --  1.47*  CALCIUM 8.9   < > 8.7*   < > 8.8* 9.1  --   --  8.8*  MG 1.4*  --  2.4  --   --   --  1.8 1.9 1.8  PHOS  --   --   --   --   --   --  3.0 3.1 3.9   < > = values in this interval not displayed.    Liver Function Tests: Recent Labs  Lab 09/26/19 1647 09/29/19 1112  AST 68* 40  ALT 30 19  ALKPHOS 85 75  BILITOT 1.2 1.0  PROT 6.3* 4.9*  ALBUMIN 3.2* 2.3*   Recent Labs  Lab 09/26/19 1647  LIPASE 25   Recent Labs  Lab 09/29/19 1112  AMMONIA 16    CBC: Recent Labs  Lab 09/27/19 0428 09/27/19 0428 09/28/19 0408 09/29/19 0526 09/30/19 0351 10/01/19 0306 10/02/19 0604  WBC 5.1   < > 10.3 9.6 8.1 11.3* 14.1*  NEUTROABS 2.9  --  6.6  --   --   --   --   HGB 13.3   < > 14.5 11.7* 11.2* 10.9* 10.1*  HCT 39.7   < > 43.5 35.0* 33.5* 33.6* 32.2*  MCV 94.5   < > 94.2 94.1 94.6 96.8 100.6*  PLT 214   < > 240 262 234 324 321   < > = values in this interval not displayed.    Cardiac Enzymes: No results for input(s): CKTOTAL, CKMB, CKMBINDEX, TROPONINI in the last 168 hours.  BNP: BNP (last 3 results) No results for input(s): BNP in the last 8760 hours.  ProBNP (last 3 results) No results for input(s): PROBNP in the last 8760 hours.    Other results:  Imaging: CT ANGIO HEAD W OR WO CONTRAST  Result Date: 09/30/2019 CLINICAL DATA:  Stroke. Left facial droop and left arm weakness. Slurred speech. EXAM: CT ANGIOGRAPHY HEAD AND NECK CT PERFUSION BRAIN TECHNIQUE: Multidetector CT imaging of the head and neck was performed using the standard protocol during bolus administration of intravenous contrast. Multiplanar CT image reconstructions and MIPs were obtained  to evaluate the vascular anatomy. Carotid stenosis measurements (when applicable) are obtained utilizing NASCET criteria, using the distal internal carotid diameter as the denominator. Multiphase CT imaging of the brain was performed following IV bolus contrast injection. Subsequent parametric perfusion maps were calculated using RAPID software. CONTRAST:  OMNIPAQUE IOHEXOL 350 MG/ML SOLN COMPARISON:  CT head 09/30/2019 FINDINGS: CTA NECK FINDINGS Aortic arch: Atherosclerotic aortic arch. Three-vessel arch. Proximal great vessels widely patent. Right carotid system: Atherosclerotic calcification distal right common carotid artery without significant stenosis. Atherosclerotic disease right carotid bifurcation without significant stenosis. Tortuous right internal carotid artery. Left carotid system: Mild atherosclerotic disease left common carotid artery without significant stenosis. Left carotid bifurcation widely patent without stenosis. Vertebral arteries: Left vertebral artery dominant. Both vertebral arteries widely patent to the basilar without stenosis. Skeleton: No acute skeletal abnormality. Cervical spine degenerative changes relatively mild. Other neck: Negative for mass or adenopathy. 1 cm right thyroid nodule. No further imaging necessary. Upper chest: Lung apices clear bilaterally. Small left pleural effusion. Review of the MIP images confirms the above findings CTA HEAD FINDINGS Anterior circulation: Cavernous carotid patent bilaterally without significant stenosis. Moderate stenosis distal right M1 segment. Moderate to severe focal stenosis inferior division of right middle cerebral artery. This corresponds to the area of decreased perfusion on the perfusion study. No vessel occlusion or thrombus identified. Both anterior cerebral arteries patent without stenosis. Left MCA patent without significant stenosis. Posterior circulation: Both vertebral arteries patent to the basilar. PICA patent  bilaterally. Basilar widely patent. Superior cerebellar and posterior cerebral arteries patent bilaterally. Mild to moderate stenosis proximal right posterior cerebral artery. Moderate stenosis distal right posterior cerebral artery. Fetal origin of the left posterior cerebral artery. Venous sinuses: Normal venous enhancement. Anatomic variants: None Review of the MIP images confirms the above findings CT Brain Perfusion Findings: ASPECTS: 9 CBF (<30%) Volume: 54mL Perfusion (Tmax>6.0s) volume: 56mL Mismatch Volume: 61mL Infarction Location:Right parietal lobe. IMPRESSION: 1. 29 mL of delayed perfusion right parietal lobe. No fixed infarct on CT perfusion. 2. Moderate stenosis distal right M1 segment. Moderate to severe stenosis inferior branch of right middle cerebral  artery. No large vessel occlusion. 3. Moderate stenosis right PCA proximally and distally. 4. No significant carotid or vertebral artery stenosis in the neck. 5. These results were called by telephone at the time of interpretation on 09/30/2019 at 1:50 pm to provider Lindzen, who verbally acknowledged these results. Electronically Signed   By: Marlan Palau M.D.   On: 09/30/2019 13:51   DG Abd 1 View  Result Date: 10/01/2019 CLINICAL DATA:  NG tube placement. EXAM: ABDOMEN - 1 VIEW COMPARISON:  None. FINDINGS: The NG tube tip is seen in the lower chest at the T10 level, likely in the distal esophagus. Bowel gas pattern is normal. Small amount of contrast in the kidneys and bladder. No acute bone abnormality. IMPRESSION: NG tube tip in the distal esophagus. Electronically Signed   By: Francene Boyers M.D.   On: 10/01/2019 12:34   CT HEAD WO CONTRAST  Result Date: 10/01/2019 CLINICAL DATA:  Stroke follow-up. Lethargy and confusion with right temporal headache. EXAM: CT HEAD WITHOUT CONTRAST TECHNIQUE: Contiguous axial images were obtained from the base of the skull through the vertex without intravenous contrast. COMPARISON:  CT and CTA from  yesterday FINDINGS: Brain: Loss of gray-white differentiation centered in the right parietal lobe correlating with the perfusion defect on prior. No hemorrhagic conversion. There has been age-indeterminate infarction of the right caudate head, stable from yesterday. No hydrocephalus or masslike finding Vascular: No hyperdense vessel. Skull: Normal. Negative for fracture or focal lesion. Sinuses/Orbits: No acute finding Other: Motion degraded IMPRESSION: 1. Acute infarct centered in the right parietal lobe correlating with perfusion defects yesterday. 2. Age-indeterminate infarct at the right caudate that is stable from yesterday. 3. Motion degraded Electronically Signed   By: Marnee Spring M.D.   On: 10/01/2019 06:56   CT ANGIO NECK W OR WO CONTRAST  Result Date: 09/30/2019 CLINICAL DATA:  Stroke. Left facial droop and left arm weakness. Slurred speech. EXAM: CT ANGIOGRAPHY HEAD AND NECK CT PERFUSION BRAIN TECHNIQUE: Multidetector CT imaging of the head and neck was performed using the standard protocol during bolus administration of intravenous contrast. Multiplanar CT image reconstructions and MIPs were obtained to evaluate the vascular anatomy. Carotid stenosis measurements (when applicable) are obtained utilizing NASCET criteria, using the distal internal carotid diameter as the denominator. Multiphase CT imaging of the brain was performed following IV bolus contrast injection. Subsequent parametric perfusion maps were calculated using RAPID software. CONTRAST:  OMNIPAQUE IOHEXOL 350 MG/ML SOLN COMPARISON:  CT head 09/30/2019 FINDINGS: CTA NECK FINDINGS Aortic arch: Atherosclerotic aortic arch. Three-vessel arch. Proximal great vessels widely patent. Right carotid system: Atherosclerotic calcification distal right common carotid artery without significant stenosis. Atherosclerotic disease right carotid bifurcation without significant stenosis. Tortuous right internal carotid artery. Left carotid  system: Mild atherosclerotic disease left common carotid artery without significant stenosis. Left carotid bifurcation widely patent without stenosis. Vertebral arteries: Left vertebral artery dominant. Both vertebral arteries widely patent to the basilar without stenosis. Skeleton: No acute skeletal abnormality. Cervical spine degenerative changes relatively mild. Other neck: Negative for mass or adenopathy. 1 cm right thyroid nodule. No further imaging necessary. Upper chest: Lung apices clear bilaterally. Small left pleural effusion. Review of the MIP images confirms the above findings CTA HEAD FINDINGS Anterior circulation: Cavernous carotid patent bilaterally without significant stenosis. Moderate stenosis distal right M1 segment. Moderate to severe focal stenosis inferior division of right middle cerebral artery. This corresponds to the area of decreased perfusion on the perfusion study. No vessel occlusion or thrombus identified. Both  anterior cerebral arteries patent without stenosis. Left MCA patent without significant stenosis. Posterior circulation: Both vertebral arteries patent to the basilar. PICA patent bilaterally. Basilar widely patent. Superior cerebellar and posterior cerebral arteries patent bilaterally. Mild to moderate stenosis proximal right posterior cerebral artery. Moderate stenosis distal right posterior cerebral artery. Fetal origin of the left posterior cerebral artery. Venous sinuses: Normal venous enhancement. Anatomic variants: None Review of the MIP images confirms the above findings CT Brain Perfusion Findings: ASPECTS: 9 CBF (<30%) Volume: 71mL Perfusion (Tmax>6.0s) volume: 68mL Mismatch Volume: 57mL Infarction Location:Right parietal lobe. IMPRESSION: 1. 29 mL of delayed perfusion right parietal lobe. No fixed infarct on CT perfusion. 2. Moderate stenosis distal right M1 segment. Moderate to severe stenosis inferior branch of right middle cerebral artery. No large vessel occlusion.  3. Moderate stenosis right PCA proximally and distally. 4. No significant carotid or vertebral artery stenosis in the neck. 5. These results were called by telephone at the time of interpretation on 09/30/2019 at 1:50 pm to provider Lindzen, who verbally acknowledged these results. Electronically Signed   By: Franchot Gallo M.D.   On: 09/30/2019 13:51   CARDIAC CATHETERIZATION  Result Date: 09/30/2019  Proximal LAD is 90% stenosed  A drug-eluting stent was successfully placed using a STENT RESOLUTE ONYX 3.0X12.  Post intervention, there is a 0% residual stenosis.  Mid Cx lesion is 95% stenosed.  A drug-eluting stent was successfully placed using a STENT RESOLUTE ONYX 2.5X26.  Post intervention, there is a 0% residual stenosis.  1. Successful PCI of the proximal LAD with DES x 1 2. Successful PCI of the mid LCx with DES x 1. Plan: DAPT for one year. Will continue IV Cangrelor until NG in place and patient can be loaded with oral Plavix.   CT CEREBRAL PERFUSION W CONTRAST  Result Date: 09/30/2019 CLINICAL DATA:  Stroke. Left facial droop and left arm weakness. Slurred speech. EXAM: CT ANGIOGRAPHY HEAD AND NECK CT PERFUSION BRAIN TECHNIQUE: Multidetector CT imaging of the head and neck was performed using the standard protocol during bolus administration of intravenous contrast. Multiplanar CT image reconstructions and MIPs were obtained to evaluate the vascular anatomy. Carotid stenosis measurements (when applicable) are obtained utilizing NASCET criteria, using the distal internal carotid diameter as the denominator. Multiphase CT imaging of the brain was performed following IV bolus contrast injection. Subsequent parametric perfusion maps were calculated using RAPID software. CONTRAST:  142mL OMNIPAQUE IOHEXOL 350 MG/ML SOLN COMPARISON:  CT head 09/30/2019 FINDINGS: CTA NECK FINDINGS Aortic arch: Atherosclerotic aortic arch. Three-vessel arch. Proximal great vessels widely patent. Right carotid system:  Atherosclerotic calcification distal right common carotid artery without significant stenosis. Atherosclerotic disease right carotid bifurcation without significant stenosis. Tortuous right internal carotid artery. Left carotid system: Mild atherosclerotic disease left common carotid artery without significant stenosis. Left carotid bifurcation widely patent without stenosis. Vertebral arteries: Left vertebral artery dominant. Both vertebral arteries widely patent to the basilar without stenosis. Skeleton: No acute skeletal abnormality. Cervical spine degenerative changes relatively mild. Other neck: Negative for mass or adenopathy. 1 cm right thyroid nodule. No further imaging necessary. Upper chest: Lung apices clear bilaterally. Small left pleural effusion. Review of the MIP images confirms the above findings CTA HEAD FINDINGS Anterior circulation: Cavernous carotid patent bilaterally without significant stenosis. Moderate stenosis distal right M1 segment. Moderate to severe focal stenosis inferior division of right middle cerebral artery. This corresponds to the area of decreased perfusion on the perfusion study. No vessel occlusion or thrombus identified. Both anterior cerebral  arteries patent without stenosis. Left MCA patent without significant stenosis. Posterior circulation: Both vertebral arteries patent to the basilar. PICA patent bilaterally. Basilar widely patent. Superior cerebellar and posterior cerebral arteries patent bilaterally. Mild to moderate stenosis proximal right posterior cerebral artery. Moderate stenosis distal right posterior cerebral artery. Fetal origin of the left posterior cerebral artery. Venous sinuses: Normal venous enhancement. Anatomic variants: None Review of the MIP images confirms the above findings CT Brain Perfusion Findings: ASPECTS: 9 CBF (<30%) Volume: 86mL Perfusion (Tmax>6.0s) volume: 37mL Mismatch Volume: 89mL Infarction Location:Right parietal lobe. IMPRESSION: 1. 29  mL of delayed perfusion right parietal lobe. No fixed infarct on CT perfusion. 2. Moderate stenosis distal right M1 segment. Moderate to severe stenosis inferior branch of right middle cerebral artery. No large vessel occlusion. 3. Moderate stenosis right PCA proximally and distally. 4. No significant carotid or vertebral artery stenosis in the neck. 5. These results were called by telephone at the time of interpretation on 09/30/2019 at 1:50 pm to provider Lindzen, who verbally acknowledged these results. Electronically Signed   By: Marlan Palau M.D.   On: 09/30/2019 13:51   MR CARDIAC MORPHOLOGY W WO CONTRAST  Result Date: 09/30/2019 CLINICAL DATA:  Patient hx of CAD, HFrEF. Cardiac MR performed to evaluate viability EXAM: CARDIAC MRI TECHNIQUE: The patient was scanned on a 1.5 Tesla GE magnet. A dedicated cardiac coil was used. Functional imaging was done using Fiesta sequences. 2,3, and 4 chamber views were done to assess for RWMA's. Modified Simpson's rule using a short axis stack was used to calculate an ejection fraction on a dedicated work [employer]. The patient received cc of Gadavist. After 10 minutes inversion recovery sequences were used to assess for infiltration and scar tissue. Due to uncooperative patient, free breathing techniques implemented. CONTRAST:  7 cc  of Gadavist FINDINGS: 1. Normal left ventricular size and thickness. The systolic function is severely reduced (LVEF =29%). There is global hypokinesis. There is no late gadolinium enhancement in the left ventricular myocardium. LVEDV:171 ml LVESV: 121 ml SV: 50 ml CO: 3.98L/min Myocardial mass: 180g 2. Normal right ventricular size and thickness. RV systolic function is mild to moderately reduced (RVEF =37%). There is global hypokinesis. 3.  Left atrium is mildly dilated, normal right atrial size. 4. Normal size of the aortic root, ascending aorta and pulmonary artery. 5.  No significant valvular abnormalities. 6.   Normal pericardium. Mild circumferencial pericardial effusion. IMPRESSION: 1. Severely reduced LV systolic function, global hypokinesis, LVEF of 29% 2. No evidence of Scar/late gadolinium enhancement. Entire LV myocardium appears viable 3.  Mild to moderately reduced RVEF 4.  Mild circuferential pericardial effusion 5.  Normal LV and RV chamber sizes and thickness 6. Due to uncooperative patient, free breathing techniques implemented which hinders image quality. Electronically Signed   By: Debbe Odea M.D.   On: 09/30/2019 12:33   EEG adult  Result Date: 10/01/2019 Charlsie Quest, MD     10/01/2019  2:33 PM Patient Name: Brittney Kennedy MRN: [id number] Epilepsy Attending: Charlsie Quest Referring Physician/Provider: Annie Main, NP Date: 10/01/2019 Duration: 24.40 minutes Patient history: 69 year old female presented with acute left-sided weakness and facial droop.  CT head showed right parietal lobe infarct.  EEG to evaluate for seizures. Level of alertness: awake AEDs during EEG study: None Technical aspects: This EEG study was done with scalp electrodes positioned according to the 10-20 International system of electrode placement. Electrical activity was acquired at a sampling rate of 500Hz  and reviewed  with a high frequency filter of  and a low frequency filter of . EEG data were recorded continuously and digitally stored. Description: During awake state, no clear posterior dominant rhythm was seen.  EEG showed continuous 2 to 3 Hz amplitude delta slowing in the right hemisphere as well as 3 to 6 Hz theta-delta slowing in left hemisphere.  Hyperventilation and photic stimulation were not performed. Abnormality -Continuous slow, generalized and lateralized right hemisphere IMPRESSION: This study is suggestive of cortical dysfunction in the right hemisphere likely secondary to underlying infarct as well as moderate to severe diffuse encephalopathy, nonspecific to etiology. No seizures or  epileptiform discharges were seen throughout the recording. Priyanka Annabelle Harman     Medications:     Scheduled Medications: . aspirin  300 mg Rectal Daily   Or  . aspirin  81 mg Per Tube Daily  . atorvastatin  80 mg Per Tube q1800  . Chlorhexidine Gluconate Cloth  6 each Topical Daily  . clopidogrel  75 mg Per Tube Q breakfast  . digoxin  0.125 mg Per Tube Daily  . enoxaparin (LOVENOX) injection  40 mg Subcutaneous Q24H  . metoprolol tartrate  2.5 mg Intravenous Q6H  . potassium chloride  40 mEq Oral Daily  . sodium chloride flush  3 mL Intravenous Q12H  . sodium chloride flush  3 mL Intravenous Q12H    Infusions: . sodium chloride    . sodium chloride    . feeding supplement (OSMOLITE 1.2 CAL)      PRN Medications: sodium chloride, sodium chloride, acetaminophen **OR** acetaminophen, nitroGLYCERIN, ondansetron (ZOFRAN) IV, sodium chloride flush, sodium chloride flush   Assessment/Plan:   1. CAD with NSTEMI - cath with severe diffuse 3v CAD particularly on left side with severe LV dysfunction. Ideally will need CABG, however in looking at her films I worry about the quality of her LAD target and also not sure she would benefit much from a graft to the RCA. Concerned about her mobility especially after recent fall an groin hematoma and low albumin CMRI- LVEF 29% RV 37% Global hypokinesis.  - continue ASA, statin  2. Acute systolic HF due to NICM - EF ~25% - Volume status stable.  - Off entresto, spiro, bb and allow permissive HTN.   3. R groin hematoma after fall - has hematoma at cath site.  - u/s negative for PSA - CT + hematoma. No mention of fracture but not completely imaged. - Hgb 14 -> 12->11.2 >10.0 >10.1 - off heparin.  - ON lovenox for DVT prophylaxis.  4. Hypokalemia/Hypomag Hold spiro for now.    5. Severe protein calorie malnutrition  - albumin 2.3  6.  Physical deconditioning - consult PT/OT/Speech   7 . Stroke, Acute  -CT 3/28 No acute  findings  -CT 3/29 at 0500 R basal infact, small r pontine infact.  -CT 3/30 - Acute infarct centered in the right parietal lobe correlating with perfusion defects yesterday.2. Age-indeterminate infarct at the right caudate that is stable from yesterday. - Neuro following.  - Consult PT/OT/Speech and CIR.  - Failed swallow evaluation. Cortrak today.   9. ACS  Take  Urgently to the cath lab 3/29.  - DES to prox LAD and mid CX - Loaded on plavix via NG  - Given CVA will need speech to assess. Suspect she will need Cortrak for meds.  - Continue plavix, aspirin, statin.     Length of Stay: 5   Amy Clegg NP-C  10/02/2019, 7:51 AM  Advanced Heart Failure Team Pager [phone] (M-F; 7a - 4p)  Please contact [facility] for night-coverage after hours (4p -7a ) and weekends on [email or web address]  Patient seen and examined with the above-signed Advanced Practice Provider and/or Housestaff. I personally reviewed laboratory data, imaging studies and relevant notes. I independently examined the patient and formulated the important aspects of the plan. I have edited the note to reflect any of my changes or salient points. I have personally discussed the plan with the patient and/or family.  Remains lethargic and aphasic. Still with fairly dense LUE weakness. Cor-trak in place. Was able to get Plavix. Now on TFs. No CP or SOB.   General:  Lethargic weak appearing aphasic. No resp difficulty HEENT: normal + cor-trak + left facial droop Neck: supple. no JVD. Carotids 2+ bilat; no bruits. No lymphadenopathy or thryomegaly appreciated. Cor: PMI nondisplaced. Regular rate & rhythm. No rubs, gallops or murmurs. Lungs: clear Abdomen: soft, nontender, nondistended. No hepatosplenomegaly. No bruits or masses. Good bowel sounds. Extremities: no cyanosis, clubbing, rash, edema large right groin ecchymosis Neuro: aphasic can speak a few words LUE very weak. Mild weakness in LLE  Still with fairly dense stroke  symptoms. Spoke with Neurology who agrees that stroke symptoms seem out of proportion to infarct size and hopefully she will have significant recovery. Have consulted CIR.   Cardiac symptoms much improved. Need to continue DAPT and statin. Hopefully can pass swallow study soon but I am not overly optimistic about this in the near future.  Arvilla Meres, MD  10:49 PM

## 2019-10-03 ENCOUNTER — Encounter (HOSPITAL_COMMUNITY): Payer: Self-pay | Admitting: Anesthesiology

## 2019-10-03 ENCOUNTER — Other Ambulatory Visit: Payer: Self-pay

## 2019-10-03 LAB — CBC
HCT: 32.8 % — ABNORMAL LOW (ref 36.0–46.0)
Hemoglobin: 10.5 g/dL — ABNORMAL LOW (ref 12.0–15.0)
MCH: 31.3 pg (ref 26.0–34.0)
MCHC: 32 g/dL (ref 30.0–36.0)
MCV: 97.9 fL (ref 80.0–100.0)
Platelets: 387 10*3/uL (ref 150–400)
RBC: 3.35 MIL/uL — ABNORMAL LOW (ref 3.87–5.11)
RDW: 13.6 % (ref 11.5–15.5)
WBC: 14.7 10*3/uL — ABNORMAL HIGH (ref 4.0–10.5)
nRBC: 0 % (ref 0.0–0.2)

## 2019-10-03 LAB — BASIC METABOLIC PANEL
Anion gap: 13 (ref 5–15)
BUN: 19 mg/dL (ref 8–23)
CO2: 19 mmol/L — ABNORMAL LOW (ref 22–32)
Calcium: 9.2 mg/dL (ref 8.9–10.3)
Chloride: 115 mmol/L — ABNORMAL HIGH (ref 98–111)
Creatinine, Ser: 1.26 mg/dL — ABNORMAL HIGH (ref 0.44–1.00)
GFR calc Af Amer: 51 mL/min — ABNORMAL LOW (ref >60)
GFR calc non Af Amer: 44 mL/min — ABNORMAL LOW (ref >60)
Glucose, Bld: 105 mg/dL — ABNORMAL HIGH (ref 70–99)
Potassium: 4.5 mmol/L (ref 3.5–5.1)
Sodium: 147 mmol/L — ABNORMAL HIGH (ref 135–145)

## 2019-10-03 LAB — GLUCOSE, CAPILLARY
Glucose-Capillary: 106 mg/dL — ABNORMAL HIGH (ref 70–99)
Glucose-Capillary: 108 mg/dL — ABNORMAL HIGH (ref 70–99)
Glucose-Capillary: 116 mg/dL — ABNORMAL HIGH (ref 70–99)
Glucose-Capillary: 121 mg/dL — ABNORMAL HIGH (ref 70–99)
Glucose-Capillary: 123 mg/dL — ABNORMAL HIGH (ref 70–99)

## 2019-10-03 LAB — MAGNESIUM: Magnesium: 1.8 mg/dL (ref 1.7–2.4)

## 2019-10-03 LAB — PHOSPHORUS: Phosphorus: 3.6 mg/dL (ref 2.5–4.6)

## 2019-10-03 MED ORDER — POTASSIUM CHLORIDE 2 MEQ/ML IV SOLN
80.0000 meq | INTRAVENOUS | Status: DC
  Filled 2019-10-03: qty 40

## 2019-10-03 MED ORDER — PLASMA-LYTE 148 IV SOLN
INTRAVENOUS | Status: DC
  Filled 2019-10-03: qty 2.5

## 2019-10-03 MED ORDER — VANCOMYCIN HCL 1250 MG/250ML IV SOLN
1250.0000 mg | INTRAVENOUS | Status: DC
  Filled 2019-10-03: qty 250

## 2019-10-03 MED ORDER — NOREPINEPHRINE 4 MG/250ML-% IV SOLN
0.0000 ug/min | INTRAVENOUS | Status: DC
  Filled 2019-10-03: qty 250

## 2019-10-03 MED ORDER — SODIUM CHLORIDE 0.9 % IV SOLN
1.5000 g | INTRAVENOUS | Status: DC
  Filled 2019-10-03: qty 1.5

## 2019-10-03 MED ORDER — MILRINONE LACTATE IN DEXTROSE 20-5 MG/100ML-% IV SOLN
0.3000 ug/kg/min | INTRAVENOUS | Status: DC
  Filled 2019-10-03: qty 100

## 2019-10-03 MED ORDER — TRANEXAMIC ACID (OHS) PUMP PRIME SOLUTION
2.0000 mg/kg | INTRAVENOUS | Status: DC
  Filled 2019-10-03: qty 1.39

## 2019-10-03 MED ORDER — SODIUM CHLORIDE 0.9 % IV SOLN
INTRAVENOUS | Status: DC
  Filled 2019-10-03: qty 30

## 2019-10-03 MED ORDER — ORAL CARE MOUTH RINSE
15.0000 mL | Freq: Two times a day (BID) | OROMUCOSAL | Status: DC
  Administered 2019-10-03 – 2019-10-16 (×26): 15 mL via OROMUCOSAL

## 2019-10-03 MED ORDER — PHENYLEPHRINE HCL-NACL 20-0.9 MG/250ML-% IV SOLN
30.0000 ug/min | INTRAVENOUS | Status: DC
  Filled 2019-10-03: qty 250

## 2019-10-03 MED ORDER — TRANEXAMIC ACID 1000 MG/10ML IV SOLN
1.5000 mg/kg/h | INTRAVENOUS | Status: DC
  Filled 2019-10-03: qty 25

## 2019-10-03 MED ORDER — EPINEPHRINE HCL 5 MG/250ML IV SOLN IN NS
0.0000 ug/min | INTRAVENOUS | Status: DC
  Filled 2019-10-03: qty 250

## 2019-10-03 MED ORDER — INSULIN REGULAR(HUMAN) IN NACL 100-0.9 UT/100ML-% IV SOLN
INTRAVENOUS | Status: DC
  Filled 2019-10-03: qty 100

## 2019-10-03 MED ORDER — MAGNESIUM SULFATE 50 % IJ SOLN
40.0000 meq | INTRAMUSCULAR | Status: DC
  Filled 2019-10-03: qty 9.85

## 2019-10-03 MED ORDER — DEXMEDETOMIDINE HCL IN NACL 400 MCG/100ML IV SOLN
0.1000 ug/kg/h | INTRAVENOUS | Status: DC
  Filled 2019-10-03: qty 100

## 2019-10-03 MED ORDER — CHLORHEXIDINE GLUCONATE 0.12 % MT SOLN
15.0000 mL | Freq: Two times a day (BID) | OROMUCOSAL | Status: DC
  Administered 2019-10-03 – 2019-10-16 (×26): 15 mL via OROMUCOSAL
  Filled 2019-10-03 (×24): qty 15

## 2019-10-03 MED ORDER — SODIUM CHLORIDE 0.9 % IV SOLN
750.0000 mg | INTRAVENOUS | Status: DC
  Filled 2019-10-03: qty 750

## 2019-10-03 MED ORDER — TRANEXAMIC ACID (OHS) BOLUS VIA INFUSION
15.0000 mg/kg | INTRAVENOUS | Status: DC
  Filled 2019-10-03: qty 1043

## 2019-10-03 MED ORDER — NITROGLYCERIN IN D5W 200-5 MCG/ML-% IV SOLN
2.0000 ug/min | INTRAVENOUS | Status: DC
  Filled 2019-10-03: qty 250

## 2019-10-03 NOTE — Progress Notes (Signed)
Provided her son Riley Lam with an update.   Anticipate transfer to [facility] tomorrow. Discussed with department director of [facility].   Kunal Levario NP-C  5:51 PM

## 2019-10-03 NOTE — Progress Notes (Signed)
Advanced Heart Failure Rounding Note   Subjective:    Events  - cath 09/27/19 3v CAD. EF 20% - right groin hematoma. U/s 3/27 negative for PSA - 3/28 altered mental status due to pain meds. Head CT, ABG and ammonia normal - 09/30/19 CVA, ACS with DES LAD and mid CX - 3/30 Headache with stat CT. EEG- no seizure, cortical dysfunction in the right hemisphere likely secondary to underlying infarct as well as moderate to severe diffuse encephalopathy, nonspecific to etiology.   Pulled out Cor-trak overnight. Remains lethargic and aphasic. Will respond to commands  Objective:   Weight Range:  Vital Signs:   Temp:  [97.7 F (36.5 C)-98.5 F (36.9 C)] 98.5 F (36.9 C) (04/01 0400) Pulse Rate:  [86-112] 102 (04/01 0600) Resp:  [17-34] 26 (04/01 0600) BP: (103-168)/(41-112) 164/79 (04/01 0600) SpO2:  [97 %-100 %] 100 % (04/01 0600) Weight:  [69.5 kg] 69.5 kg (04/01 0452) Last BM Date: 10/01/19  Weight change: Filed Weights   09/30/19 0421 10/02/19 0500 10/03/19 0452  Weight: 72.9 kg 68.6 kg 69.5 kg    Intake/Output:   Intake/Output Summary (Last 24 hours) at 10/03/2019 0743 Last data filed at 10/03/2019 0600 Gross per 24 hour  Intake 224 ml  Output 50 ml  Net 174 ml     Physical Exam: General:  Pale. Drowsy  No resp difficulty HEENT: normal left facial droop  Neck: supple. no JVD. Carotids 2+ bilat; no bruits. No lymphadenopathy or thryomegaly appreciated. Cor: PMI nondisplaced. Regular rate & rhythm. No rubs, gallops or murmurs. Lungs: clear Abdomen: soft, nontender, nondistended. No hepatosplenomegaly. No bruits or masses. Good bowel sounds. Extremities: no cyanosis, clubbing, rash, edema. R groin ecchymotic. Soft mittens on R and L hand.  Neuro: L facial droop. LUE flaccid. LLE weak.   Arouses and responds to her name.   Telemetry: Labs: Basic Metabolic Panel: Recent Labs  Lab 09/28/19 0408 09/28/19 0408 09/29/19 1601 09/29/19 0932 09/30/19 3557 10/01/19 0306  10/01/19 1107 10/01/19 1702 10/02/19 0604 10/03/19 0258  NA 142  --  140  --  141 144  --   --  143  --   K 3.2*  --  3.5  --  3.7 4.3  --   --  4.2  --   CL 106  --  107  --  107 111  --   --  111  --   CO2 16*  --  21*  --  20* 17*  --   --  15*  --   GLUCOSE 107*  --  150*  --  161* 118*  --   --  80  --   BUN 10  --  14  --  12 14  --   --  17  --   CREATININE 0.83  --  0.75  --  0.90 1.11*  --   --  1.47*  --   CALCIUM 8.9   < > 8.7*   < > 8.8* 9.1  --   --  8.8*  --   MG 1.4*   < > 2.4  --   --   --  1.8 1.9 1.8 1.8  PHOS  --   --   --   --   --   --  3.0 3.1 3.9 3.6   < > = values in this interval not displayed.    Liver Function Tests: Recent Labs  Lab 09/26/19 1647 09/29/19 1112  AST 68*  40  ALT 30 19  ALKPHOS 85 75  BILITOT 1.2 1.0  PROT 6.3* 4.9*  ALBUMIN 3.2* 2.3*   Recent Labs  Lab 09/26/19 1647  LIPASE 25   Recent Labs  Lab 09/29/19 1112  AMMONIA 16    CBC: Recent Labs  Lab 09/27/19 0428 09/27/19 0428 09/28/19 0408 09/28/19 0408 09/29/19 0526 09/30/19 0351 10/01/19 0306 10/02/19 0604 10/03/19 0258  WBC 5.1   < > 10.3   < > 9.6 8.1 11.3* 14.1* 14.7*  NEUTROABS 2.9  --  6.6  --   --   --   --   --   --   HGB 13.3   < > 14.5   < > 11.7* 11.2* 10.9* 10.1* 10.5*  HCT 39.7   < > 43.5   < > 35.0* 33.5* 33.6* 32.2* 32.8*  MCV 94.5   < > 94.2   < > 94.1 94.6 96.8 100.6* 97.9  PLT 214   < > 240   < > 262 234 324 321 387   < > = values in this interval not displayed.    Cardiac Enzymes: No results for input(s): CKTOTAL, CKMB, CKMBINDEX, TROPONINI in the last 168 hours.  BNP: BNP (last 3 results) No results for input(s): BNP in the last 8760 hours.  ProBNP (last 3 results) No results for input(s): PROBNP in the last 8760 hours.    Other results:  Imaging: DG Abd 1 View  Result Date: 10/01/2019 CLINICAL DATA:  NG tube placement. EXAM: ABDOMEN - 1 VIEW COMPARISON:  None. FINDINGS: The NG tube tip is seen in the lower chest at the T10  level, likely in the distal esophagus. Bowel gas pattern is normal. Small amount of contrast in the kidneys and bladder. No acute bone abnormality. IMPRESSION: NG tube tip in the distal esophagus. Electronically Signed   By: Francene Boyers M.D.   On: 10/01/2019 12:34   EEG adult  Result Date: 10/01/2019 Charlsie Quest, MD     10/01/2019  2:33 PM Patient Name: Brittney Kennedy MRN: [id number] Epilepsy Attending: Charlsie Quest Referring Physician/Provider: Annie Main, NP Date: 10/01/2019 Duration: 24.40 minutes Patient history: 69 year old female presented with acute left-sided weakness and facial droop.  CT head showed right parietal lobe infarct.  EEG to evaluate for seizures. Level of alertness: awake AEDs during EEG study: None Technical aspects: This EEG study was done with scalp electrodes positioned according to the 10-20 International system of electrode placement. Electrical activity was acquired at a sampling rate of 500Hz  and reviewed with a high frequency filter of 70Hz  and a low frequency filter of 1Hz . EEG data were recorded continuously and digitally stored. Description: During awake state, no clear posterior dominant rhythm was seen.  EEG showed continuous 2 to 3 Hz amplitude delta slowing in the right hemisphere as well as 3 to 6 Hz theta-delta slowing in left hemisphere.  Hyperventilation and photic stimulation were not performed. Abnormality -Continuous slow, generalized and lateralized right hemisphere IMPRESSION: This study is suggestive of cortical dysfunction in the right hemisphere likely secondary to underlying infarct as well as moderate to severe diffuse encephalopathy, nonspecific to etiology. No seizures or epileptiform discharges were seen throughout the recording.   ECHOCARDIOGRAM COMPLETE  Result Date: 10/02/2019    ECHOCARDIOGRAM REPORT   Patient Name:   Brittney Kennedy Date of Exam: 10/02/2019 Medical Rec #:  10/04/2019     Height:       62.0 in Accession #:  [id number]    Weight:       151.2 lb Date of Birth:  [birth year]     BSA:          1.698 m Patient Age:    69 years      BP:           158/62 mmHg Patient Gender: F             HR:           88 bpm. Exam Location:  Inpatient Procedure: 2D Echo, Cardiac Doppler, Color Doppler and Strain Analysis Indications:    I50.40* Unspecified combined systolic (congestive) and diastolic                 (congestive) heart failure  History:        Patient has prior history of Echocardiogram examinations, most                 recent 09/27/2019. CHF, Acute MI and CAD, Abnormal ECG; Stroke.                 Post cardiac cath.  Sonographer:    Roseanna Rainbow RDCS Referring Phys: [id number] AMY D CLEGG IMPRESSIONS  1. Left ventricular ejection fraction, by estimation, is 50 to 55%. The left ventricle has low normal function. The left ventricle demonstrates regional wall motion abnormalities (see scoring diagram/findings for description). There is mild concentric left ventricular hypertrophy. Left ventricular diastolic parameters are indeterminate. Elevated left ventricular end-diastolic pressure. There is mild hypokinesis of the left ventricular, entire anteroseptal wall. There is mild hypokinesis of the left ventricular, mid-apical anterior wall.  2. Right ventricular systolic function is normal. The right ventricular size is normal. Tricuspid regurgitation signal is inadequate for assessing PA pressure.  3. The mitral valve is normal in structure. Trivial mitral valve regurgitation. No evidence of mitral stenosis.  4. The aortic valve is tricuspid. Aortic valve regurgitation is not visualized. No aortic stenosis is present. Comparison(s): Changes from prior study are noted. Conclusion(s)/Recommendation(s): LVEF significantly improved, with only trivial hypokinesis remaining in anterior/anteroseptal walls. FINDINGS  Left Ventricle: Left ventricular ejection fraction, by estimation, is 50 to 55%. The left ventricle has low normal function. The  left ventricle demonstrates regional wall motion abnormalities. Mild hypokinesis of the left ventricular, entire anteroseptal wall. Mild hypokinesis of the left ventricular, mid-apical anterior wall. The left ventricular internal cavity size was normal in size. There is mild concentric left ventricular hypertrophy. Left ventricular diastolic parameters are indeterminate. Elevated left ventricular end-diastolic pressure. Right Ventricle: The right ventricular size is normal. No increase in right ventricular wall thickness. Right ventricular systolic function is normal. Tricuspid regurgitation signal is inadequate for assessing PA pressure. Left Atrium: Left atrial size was normal in size. Right Atrium: Right atrial size was normal in size. Pericardium: There is no evidence of pericardial effusion. Mitral Valve: The mitral valve is normal in structure. Trivial mitral valve regurgitation. No evidence of mitral valve stenosis. Tricuspid Valve: The tricuspid valve is normal in structure. Tricuspid valve regurgitation is trivial. No evidence of tricuspid stenosis. Aortic Valve: The aortic valve is tricuspid. Aortic valve regurgitation is not visualized. No aortic stenosis is present. Pulmonic Valve: The pulmonic valve was not well visualized. Pulmonic valve regurgitation is trivial. No evidence of pulmonic stenosis. Aorta: The aortic root and ascending aorta are structurally normal, with no evidence of dilitation. Venous: The inferior vena cava was not well visualized. IAS/Shunts: The atrial septum is grossly normal.  LEFT VENTRICLE PLAX  2D LVIDd:         4.00 cm      Diastology LVIDs:         2.90 cm      LV e' lateral:   5.66 cm/s LV PW:         1.40 cm      LV E/e' lateral: 18.9 LV IVS:        1.50 cm      LV e' medial:    4.35 cm/s LVOT diam:     1.90 cm      LV E/e' medial:  24.6 LV SV:         70 LV SV Index:   41 LVOT Area:     2.84 cm  LV Volumes (MOD) LV vol d, MOD A2C: 101.0 ml LV vol d, MOD A4C: 93.9 ml LV vol  s, MOD A2C: 40.4 ml LV vol s, MOD A4C: 50.4 ml LV SV MOD A2C:     60.6 ml LV SV MOD A4C:     93.9 ml LV SV MOD BP:      54.7 ml RIGHT VENTRICLE             IVC RV S prime:     17.20 cm/s  IVC diam: 1.60 cm TAPSE (M-mode): 2.5 cm LEFT ATRIUM             Index       RIGHT ATRIUM           Index LA diam:        2.80 cm 1.65 cm/m  RA Area:     14.70 cm LA Vol (A2C):   47.4 ml 27.92 ml/m RA Volume:   38.30 ml  22.56 ml/m LA Vol (A4C):   26.7 ml 15.73 ml/m LA Biplane Vol: 37.1 ml 21.85 ml/m  AORTIC VALVE LVOT Vmax:   153.00 cm/s LVOT Vmean:  97.200 cm/s LVOT VTI:    0.246 m  AORTA Ao Root diam: 3.20 cm Ao Asc diam:  3.20 cm MITRAL VALVE MV Area (PHT): 2.62 cm     SHUNTS MV Decel Time: 289 msec     Systemic VTI:  0.25 m MV E velocity: 107.00 cm/s  Systemic Diam: 1.90 cm MV A velocity: 148.00 cm/s MV E/A ratio:  0.72 Jodelle Red MD Electronically signed by Jodelle Red MD Signature Date/Time: 10/02/2019/6:00:57 PM    Final      Medications:     Scheduled Medications: . aspirin  300 mg Rectal Daily   Or  . aspirin  81 mg Per Tube Daily  . atorvastatin  80 mg Per Tube q1800  . Chlorhexidine Gluconate Cloth  6 each Topical Daily  . clopidogrel  75 mg Per Tube Q breakfast  . digoxin  0.125 mg Per Tube Daily  . enoxaparin (LOVENOX) injection  40 mg Subcutaneous Q24H  . potassium chloride  40 mEq Oral Daily  . sodium chloride flush  3 mL Intravenous Q12H  . sodium chloride flush  3 mL Intravenous Q12H    Infusions: . sodium chloride    . sodium chloride    . feeding supplement (OSMOLITE 1.2 CAL) 1,000 mL (10/02/19 1157)    PRN Medications: sodium chloride, sodium chloride, acetaminophen **OR** acetaminophen, metoprolol tartrate, nitroGLYCERIN, ondansetron (ZOFRAN) IV, sodium chloride flush, sodium chloride flush   Assessment/Plan:   1. CAD with NSTEMI - cath with severe diffuse 3v CAD particularly on left side with severe LV dysfunction. Ideally will need CABG, however  in  looking at her films worry about the quality of her LAD target and also not sure she would benefit much from a graft to the RCA. Concerned about her mobility especially after recent fall an groin hematoma and low albumin CMRI- LVEF 29% RV 37% Global hypokinesis.  - continue ASA, statin  2. Acute systolic HF due to NICM - EF ~25% - Volume status stable.  - Off entresto, spiro, bb and allow permissive HTN.   3. R groin hematoma after fall - has hematoma at cath site.  - u/s negative for PSA - CT + hematoma. No mention of fracture but not completely imaged. - Hgb stable 10.5 .  - ON lovenox for DVT prophylaxis.  4. Hypokalemia/Hypomag Hold spiro for now.    5. Severe protein calorie malnutrition  - albumin 2.3  6.  Physical deconditioning - consult PT/OT/Speech   7 . Stroke, Acute  -CT 3/28 No acute findings  -CT 3/29 at 0500 R basal infact, small r pontine infact.  -CT 3/30 - Acute infarct centered in the right parietal lobe correlating with perfusion defects yesterday.2. Age-indeterminate infarct at the right caudate that is stable from yesterday. - Neuro following.  - Consult PT/OT/Speech and CIR.  - Failed swallow evaluation. Cortrak today.   9. ACS  Take  Urgently to the cath lab 3/29.  - DES to prox LAD and mid CX - Loaded on plavix via NG  - Given CVA will need speech to assess. Suspect she will need Cortrak for meds.  - Continue plavix, aspirin, statin.   -CIR consulted. PT/OT recommendations for CIR.    Length of Stay: 6   Amy Clegg NP-C  10/03/2019, 7:43 AM  Advanced Heart Failure Team Pager [phone] (M-F; 7a - 4p)  Please contact [facility] for night-coverage after hours (4p -7a ) and weekends on [email or web address]  Patient seen and examined with the above-signed Advanced Practice Provider and/or Housestaff. I personally reviewed laboratory data, imaging studies and relevant notes. I independently examined the patient and formulated the important aspects of  the plan. I have edited the note to reflect any of my changes or salient points. I have personally discussed the plan with the patient and/or family.  Pulled out Cor-trak last night. Remains lethargic and aphasic. Can follow commands. Unable to swallow. Wants a glass of water  General:  Weak. lethargic resp difficulty HEENT: normal L facial droop  Neck: supple. no JVD. Carotids 2+ bilat; no bruits. No lymphadenopathy or thryomegaly appreciated. Cor: PMI nondisplaced. Regular rate & rhythm. No rubs, gallops or murmurs. Lungs: clear Abdomen: soft, nontender, nondistended. No hepatosplenomegaly. No bruits or masses. Good bowel sounds. Extremities: no cyanosis, clubbing, rash, edema large left groin ecchymosis  Neuro: alert & orientedx3, LUE flaccid. LLE weak.  L facial droop   She continues with marked Neuro deficits. Given size of stroke on CT; we remain hopeful for good recovery. Will need Cor-trak placed back today. PT/OT to see. CIR consult placed.   Echo with EF 50-55% this am. No LV clot. No HF or CP. Continue DAPT/statin.   Arvilla Meres, MD  9:18 AM

## 2019-10-03 NOTE — Progress Notes (Addendum)
  Speech Language Pathology Treatment: Dysphagia  Patient Details Name: Brittney Kennedy MRN: [id number] DOB: 1951/01/31 Today's Date: 10/03/2019 Time: 0912-0925 SLP Time Calculation (min) (ACUTE ONLY): 13 min  Assessment / Plan / Recommendation Clinical Impression  Pt was seen for dysphagia treatment to assess any further improvement in swallow function. Cortrak was placed on 3/31 but pt has removed it. Pt's level of alertness at the onset of the session was improved compared to yesterday but she exhibited difficulty maintaining this level of alertness. She tolerated 6/8 boluses of puree solids and ice chips without overt s/sx of aspiration. She tolerated individual sips of thin liquids via straw and cup but exhibited coughing following consecutive swallows via straw. Pt did not require cueing to initiate swallowing with puree today but mild-mod oral residue was noted with full-tsp boluses and she consistently required cues for secondary swallows. Dry secondary swallows effectively cleared residue to a functional level. Pt's overall swallow function is improved compared to yesterday but she is still at notable risk of aspiration due to her highly variable level of alertness. It is recommended that the pt's NPO status be maintained and that short-term non-oral alimentation be initiated again. However, any critical meds may be given crushed in half-tsp boluses of puree. An instrumental swallow study will likely be needed when pt's level of alertness improves. SLP will continue to follow pt.    HPI HPI: 69 y/o female with hx of CAD, cholecystectomy, presented to ED with nausea/vomiting, epigastric pain for 1 week intermittently.  Admitted for NSTEMI, cath 3/26 revealed severe multivessel CAD and transferred to [facility].  Fall 3/27 with R groin hematoma.  Found AMS 3/28 due to pain meds.  Rapid response 3/29 due to AMS and L facial droop/weakness. CT revealed right basal ganglia infarct.  3/29 pm with worsening  deficits; STAT CT head with right parietal infarct.       SLP Plan  Continue with current plan of care       Recommendations  Diet recommendations: NPO Medication Administration: Crushed with puree (1/2 tsp bites only)                Oral Care Recommendations: Oral care QID;Oral care prior to ice chip/H20 Follow up Recommendations: Other (comment)(TBD) SLP Visit Diagnosis: Dysphagia, oropharyngeal phase (R13.12) Plan: Continue with current plan of care       Carletta Feasel I. Vear Clock, MS, CCC-SLP Acute Rehabilitation Services Office number [phone] Pager [phone]       Scheryl Marten 10/03/2019, 9:40 AM

## 2019-10-03 NOTE — Progress Notes (Signed)
STROKE TEAM PROGRESS NOTE   INTERVAL HISTORY Patient continues to have dysphagia and had cortrack tube inserted yesterday which she pulled out last night.  Pulled out Cor-trak last night. Remains with eye opening apraxia but follows commands on right and has left hemineglect Unable to swallow. Wants a glass of water She continues to have facial and significant left upper extremity weakness.  Vitals:   10/03/19 1230 10/03/19 1300 10/03/19 1301 10/03/19 1400  BP: (!) 141/84  (!) 161/62 (!) 131/98  Pulse: (!) 107 (!) 105 (!) 103 (!) 105  Resp: 17 20 20 16   Temp:      TempSrc:      SpO2: 100% 100% 100% 100%  Weight:      Height:        CBC:  Recent Labs  Lab 09/27/19 0428 09/27/19 0428 09/28/19 0408 09/29/19 0526 10/02/19 0604 10/03/19 0258  WBC 5.1   < > 10.3   < > 14.1* 14.7*  NEUTROABS 2.9  --  6.6  --   --   --   HGB 13.3   < > 14.5   < > 10.1* 10.5*  HCT 39.7   < > 43.5   < > 32.2* 32.8*  MCV 94.5   < > 94.2   < > 100.6* 97.9  PLT 214   < > 240   < > 321 387   < > = values in this interval not displayed.    Basic Metabolic Panel:  Recent Labs  Lab 10/02/19 0604 10/03/19 0258  NA 143 147*  K 4.2 4.5  CL 111 115*  CO2 15* 19*  GLUCOSE 80 105*  BUN 17 19  CREATININE 1.47* 1.26*  CALCIUM 8.8* 9.2  MG 1.8 1.8  PHOS 3.9 3.6   Lipid Panel:     Component Value Date/Time   CHOL 99 10/01/2019 0306   TRIG 174 (H) 10/01/2019 0306   HDL 27 (L) 10/01/2019 0306   CHOLHDL 3.7 10/01/2019 0306   VLDL 35 10/01/2019 0306   LDLCALC 37 10/01/2019 0306   HgbA1c:  Lab Results  Component Value Date   HGBA1C 5.9 (H) 09/26/2019   Urine Drug Screen:     Component Value Date/Time   LABOPIA NONE DETECTED 09/29/2019 1433   COCAINSCRNUR NONE DETECTED 09/29/2019 1433   LABBENZ POSITIVE (A) 09/29/2019 1433   AMPHETMU NONE DETECTED 09/29/2019 1433   THCU NONE DETECTED 09/29/2019 1433   LABBARB NONE DETECTED 09/29/2019 1433    Alcohol Level No results found for:  ETH  IMAGING past 24 hours No results found.  PHYSICAL EXAM [name] not in distress. . Afebrile. Head is nontraumatic. Neck is supple without bruit.    Cardiac exam no murmur or gallop. Lungs are clear to auscultation. Distal pulses are well felt. Neurological Exam :  She is awake but eyes closed with eye opening apraxia  She will respond to simple midline and one-step commands. attention is diminished.  Speech is dysarthric and hypophonic but can be understood with some difficulty.  Right gaze preference.  Unable to look all the way to the left.  Blinks to threat on the right but not on the left.  Fundi not visualized.  Left lower facial weakness.  Tongue midline.  Motor system exam shows left upper extremity weakness 3/5 strength with more weakness distally in the grip and intrinsic hand muscles.  Mild left lower extremity weakness.  Normal strength on the right.  Sensation appears symmetric.  Plantars downgoing.  Gait not tested. ASSESSMENT/PLAN [name] is a 69 y.o. female with history of cholecystectomy and left rotator cuff repair presenting to [place]. [facility] 3/25 with intermittent N/V x 1 wk and intermittent epigastric pain x 2 weeks. Found to have a NSTEMI. Admitted for workup, which revealed CAD, HFrEF who developed AMS. CT head neg. Developed L facial droop, LUE weakness, dysarthria and LUE weakness. CT now w/ R basal ganglia infarct and possible L putamen abnormality d/t LVO. AMS felt d/t medication side effect.  .   Stroke:   R parietal infarct embolic in setting of acute MI s/p cath w stent placement  CT head 3/28 No acute abnormality. Small vessel disease. Atrophy.   CT head 3/29 R basal ganglia infarct. L putamen infarct, age indeterminate. Small old R pontine infarct   CTA head & neck distal R M1 moderate stenosis, R MCA branch inferior branch moderate to severe stenosis. Proximal and distal R PCA moderate stenosis.   CT perfusion  21ml penumbra R parietal love  CT head 3/30 R basal ganglia infarct. R caudate infarct.  2D Echo EF 25-30%.   EEG nonspecific R slowing, no sz  LDL 37  HgbA1c 5.9  Lovenox 40 mg sq daily for VTE prophylaxis  No antithrombotic prior to admission, now ordered aspirin 81 mg daily and clopidogrel 75 mg daily but unable to swallow. Changed aspirin to suppository and  Per Dr. Pearlean Brownie added IV heparin. If pt can swallow or tube placed, ok to continue aspirin and plavix and stop IV heparin.   Therapy recommendations:  CIR  Disposition:  pending   CAD w/ NSTEMI  Severe diffuse 3v dz w/ severe LV dysfunction  Possible CABG  ACS  S/p stent placement proximal mid LAD and mid CX  Loaded on plavix  Acute systolic HF d/t NICM  EF 25%  Off BP meds to allow for permissive HTN  R groin hematoma following fall  Hematoma at cath site  CT w. Hematoma  Off IV heparin  On lovenox  Tachycardia  Per Dr. Pearlean Brownie started metoprolol 2.5 q6h   Blood Pressure . Permissive hypertension (OK if < 220/120) but gradually normalize in 5-7 days . Long-term BP goal normotensive  Hyperlipidemia  Home meds:  No statin listed  LDL 37, goal < 70  Now on lipitor 80  Continue statin at discharge  Dysphagia Severe protein calorie malnutrition . Secondary to stroke . NPO . Speech consulted to assess  . Unable to swallow . Coretrak and TF ordered to start  Other Stroke Risk Factors  Advanced age  Overweight, Body mass index is 28.02 kg/m., recommend weight loss, diet and exercise as appropriate   Family hx stroke (mother)  New onset Congestive heart failure - HFrEF  Other Active Problems  Hypokalemia/hypomagnesemia    Hospital day # 6   Continue speech therapy for swallow eval and physical occupational therapy as well/Discussed with patient and son at bedside and answered questions.Dr. [name] plan of care discussed with nurse at the bedside and answered questions.  Consider transfer to floor bed later today if of from cardiac standpoint. Greater than 50% time during this 25-minute visit was spent on counseling and coordination of care about her embolic stroke and discussion about evaluation treatment and answering questions. Delia Heady, MD To contact Stroke Continuity provider, please refer to [email or web address]. After hours, contact General Neurology

## 2019-10-03 NOTE — Plan of Care (Signed)
  Problem: Clinical Measurements: Goal: Ability to maintain clinical measurements within normal limits will improve Outcome: Progressing Goal: Respiratory complications will improve Outcome: Progressing   Problem: Elimination: Goal: Will not experience complications related to urinary retention Outcome: Progressing   Problem: Pain Managment: Goal: General experience of comfort will improve Outcome: Progressing   Problem: Safety: Goal: Ability to remain free from injury will improve Outcome: Progressing   Problem: Nutrition: Goal: Adequate nutrition will be maintained Outcome: Not Progressing   Problem: Self-Care: Goal: Ability to communicate needs accurately will improve Outcome: Not Progressing

## 2019-10-04 ENCOUNTER — Encounter (HOSPITAL_COMMUNITY)
Admission: AD | Disposition: A | Discharge status: Rehabilitation Facility | Payer: Self-pay | Source: Other Acute Inpatient Hospital | Attending: Internal Medicine

## 2019-10-04 DIAGNOSIS — I472 Ventricular tachycardia: Secondary | ICD-10-CM

## 2019-10-04 DIAGNOSIS — E785 Hyperlipidemia, unspecified: Secondary | ICD-10-CM

## 2019-10-04 LAB — URINALYSIS, ROUTINE W REFLEX MICROSCOPIC
Bilirubin Urine: NEGATIVE
Glucose, UA: NEGATIVE mg/dL
Hgb urine dipstick: NEGATIVE
Ketones, ur: 20 mg/dL — AB
Nitrite: NEGATIVE
Protein, ur: 100 mg/dL — AB
Specific Gravity, Urine: 1.025 (ref 1.005–1.030)
pH: 5 (ref 5.0–8.0)

## 2019-10-04 LAB — GLUCOSE, CAPILLARY
Glucose-Capillary: 107 mg/dL — ABNORMAL HIGH (ref 70–99)
Glucose-Capillary: 118 mg/dL — ABNORMAL HIGH (ref 70–99)
Glucose-Capillary: 118 mg/dL — ABNORMAL HIGH (ref 70–99)
Glucose-Capillary: 100 mg/dL — ABNORMAL HIGH (ref 70–99)
Glucose-Capillary: 125 mg/dL — ABNORMAL HIGH (ref 70–99)

## 2019-10-04 LAB — CBC
HCT: 35.4 % — ABNORMAL LOW (ref 36.0–46.0)
Hemoglobin: 11 g/dL — ABNORMAL LOW (ref 12.0–15.0)
MCH: 31.3 pg (ref 26.0–34.0)
MCHC: 31.1 g/dL (ref 30.0–36.0)
MCV: 100.6 fL — ABNORMAL HIGH (ref 80.0–100.0)
Platelets: 385 10*3/uL (ref 150–400)
RBC: 3.52 MIL/uL — ABNORMAL LOW (ref 3.87–5.11)
RDW: 13.9 % (ref 11.5–15.5)
WBC: 12.2 10*3/uL — ABNORMAL HIGH (ref 4.0–10.5)
nRBC: 0 % (ref 0.0–0.2)

## 2019-10-04 LAB — BASIC METABOLIC PANEL
Anion gap: 14 (ref 5–15)
BUN: 18 mg/dL (ref 8–23)
CO2: 21 mmol/L — ABNORMAL LOW (ref 22–32)
Calcium: 9.4 mg/dL (ref 8.9–10.3)
Chloride: 115 mmol/L — ABNORMAL HIGH (ref 98–111)
Creatinine, Ser: 1.17 mg/dL — ABNORMAL HIGH (ref 0.44–1.00)
GFR calc Af Amer: 56 mL/min — ABNORMAL LOW (ref >60)
GFR calc non Af Amer: 48 mL/min — ABNORMAL LOW (ref >60)
Glucose, Bld: 109 mg/dL — ABNORMAL HIGH (ref 70–99)
Potassium: 4.3 mmol/L (ref 3.5–5.1)
Sodium: 150 mmol/L — ABNORMAL HIGH (ref 135–145)

## 2019-10-04 LAB — MAGNESIUM: Magnesium: 1.8 mg/dL (ref 1.7–2.4)

## 2019-10-04 LAB — PHOSPHORUS: Phosphorus: 3.9 mg/dL (ref 2.5–4.6)

## 2019-10-04 SURGERY — CORONARY ARTERY BYPASS GRAFTING (CABG)
Anesthesia: General | Site: Chest

## 2019-10-04 MED ORDER — AMANTADINE HCL 100 MG PO CAPS
100.0000 mg | ORAL_CAPSULE | Freq: Two times a day (BID) | ORAL | Status: DC
  Administered 2019-10-05 – 2019-10-16 (×21): 100 mg via ORAL
  Filled 2019-10-04 (×26): qty 1

## 2019-10-04 NOTE — Progress Notes (Signed)
STROKE TEAM PROGRESS NOTE   INTERVAL HISTORY Patient continues to have dysphagia and had cortrack tube inserted yesterday  . Remains with eye opening apraxia but follows commands on right and has left hemineglect Unable to swallow.   She continues to have facial and significant left upper extremity weakness.  Vitals:   10/04/19 1100 10/04/19 1200 10/04/19 1425 10/04/19 1433  BP: (!) 172/87 (!) 160/92 (!) 171/72 (!) 162/73  Pulse: (!) 111 (!) 106 (!) 108 (!) 109  Resp: 13 10 16 16   Temp: 98.5 F (36.9 C)  99 F (37.2 C)   TempSrc: Oral  Oral   SpO2: 100% 100% 100%   Weight:      Height:        CBC:  Recent Labs  Lab 09/28/19 0408 09/29/19 0526 10/03/19 0258 10/04/19 0400  WBC 10.3   < > 14.7* 12.2*  NEUTROABS 6.6  --   --   --   HGB 14.5   < > 10.5* 11.0*  HCT 43.5   < > 32.8* 35.4*  MCV 94.2   < > 97.9 100.6*  PLT 240   < > 387 385   < > = values in this interval not displayed.    Basic Metabolic Panel:  Recent Labs  Lab 10/03/19 0258 10/04/19 0400  NA 147* 150*  K 4.5 4.3  CL 115* 115*  CO2 19* 21*  GLUCOSE 105* 109*  BUN 19 18  CREATININE 1.26* 1.17*  CALCIUM 9.2 9.4  MG 1.8 1.8  PHOS 3.6 3.9   Lipid Panel:     Component Value Date/Time   CHOL 99 10/01/2019 0306   TRIG 174 (H) 10/01/2019 0306   HDL 27 (L) 10/01/2019 0306   CHOLHDL 3.7 10/01/2019 0306   VLDL 35 10/01/2019 0306   LDLCALC 37 10/01/2019 0306   HgbA1c:  Lab Results  Component Value Date   HGBA1C 5.9 (H) 09/26/2019   Urine Drug Screen:     Component Value Date/Time   LABOPIA NONE DETECTED 09/29/2019 1433   COCAINSCRNUR NONE DETECTED 09/29/2019 1433   LABBENZ POSITIVE (A) 09/29/2019 1433   AMPHETMU NONE DETECTED 09/29/2019 1433   THCU NONE DETECTED 09/29/2019 1433   LABBARB NONE DETECTED 09/29/2019 1433    Alcohol Level No results found for: ETH  IMAGING past 24 hours No results found.  PHYSICAL EXAM [name] not in distress. . Afebrile. Head is  nontraumatic. Neck is supple without bruit.    Cardiac exam no murmur or gallop. Lungs are clear to auscultation. Distal pulses are well felt. Neurological Exam :  She is awake but eyes closed with eye opening apraxia  She will respond to simple midline and one-step commands. attention is diminished.  Speech is dysarthric and hypophonic but can be understood with some difficulty.  Right gaze preference.  Unable to look all the way to the left.  Blinks to threat on the right but not on the left.  Fundi not visualized.  Left lower facial weakness.  Tongue midline.  Motor system exam shows left upper extremity weakness 3/5 strength with more weakness distally in the grip and intrinsic hand muscles.  Mild left lower extremity weakness.  Normal strength on the right.  Sensation appears symmetric.  Plantars downgoing.  Gait not tested. ASSESSMENT/PLAN [name] is a 69 y.o. female with history of cholecystectomy and left rotator cuff repair presenting to [facility] 3/25 with intermittent N/V x 1 wk and intermittent epigastric  pain x 2 weeks. Found to have a NSTEMI. Admitted for workup, which revealed CAD, HFrEF who developed AMS. CT head neg. Developed L facial droop, LUE weakness, dysarthria and LUE weakness. CT now w/ R basal ganglia infarct and possible L putamen abnormality d/t LVO. AMS felt d/t medication side effect.  .   Stroke:   R parietal infarct embolic in setting of acute MI s/p cath w stent placement  CT head 3/28 No acute abnormality. Small vessel disease. Atrophy.   CT head 3/29 R basal ganglia infarct. L putamen infarct, age indeterminate. Small old R pontine infarct   CTA head & neck distal R M1 moderate stenosis, R MCA branch inferior branch moderate to severe stenosis. Proximal and distal R PCA moderate stenosis.   CT perfusion 63ml penumbra R parietal love  CT head 3/30 R basal ganglia infarct. R caudate infarct.  2D Echo EF 25-30%.   EEG nonspecific R  slowing, no sz  LDL 37  HgbA1c 5.9  Lovenox 40 mg sq daily for VTE prophylaxis  No antithrombotic prior to admission, now ordered aspirin 81 mg daily and clopidogrel 75 mg daily but unable to swallow. Changed aspirin to suppository and  Per Dr. Leonie Man added IV heparin. If pt can swallow or tube placed, ok to continue aspirin and plavix and stop IV heparin.   Therapy recommendations:  CIR  Disposition:  pending   CAD w/ NSTEMI  Severe diffuse 3v dz w/ severe LV dysfunction  Possible CABG  ACS  S/p stent placement proximal mid LAD and mid CX  Loaded on plavix  Acute systolic HF d/t NICM  EF 04%  Off BP meds to allow for permissive HTN  R groin hematoma following fall  Hematoma at cath site  CT w. Hematoma  Off IV heparin  On lovenox  Tachycardia  Per Dr. Leonie Man started metoprolol 2.5 q6h   Blood Pressure . Permissive hypertension (OK if < 220/120) but gradually normalize in 5-7 days . Long-term BP goal normotensive  Hyperlipidemia  Home meds:  No statin listed  LDL 37, goal < 70  Now on lipitor 80  Continue statin at discharge  Dysphagia Severe protein calorie malnutrition . Secondary to stroke . NPO . Speech consulted to assess  . Unable to swallow . Coretrak and TF ordered to start  Other Stroke Risk Factors  Advanced age  Overweight, Body mass index is 27.86 kg/m., recommend weight loss, diet and exercise as appropriate   Family hx stroke (mother)  New onset Congestive heart failure - HFrEF  Other Active Problems  Hypokalemia/hypomagnesemia    Hospital day # 7   Continue speech therapy for swallow eval and physical occupational therapy as well.Start Amatadine 100 mg bid to improve arousal.Discussed with RN and pharmacist at bedside and answered questions Greater than 50% time during this 25-minute visit was spent on counseling and coordination of care about her embolic stroke and discussion about evaluation treatment and answering  questions. Antony Contras, MD To contact Stroke Continuity provider, please refer to [email or web address] After hours, contact General Neurology

## 2019-10-04 NOTE — Progress Notes (Signed)
The chaplain responded to a request for prayer. The chaplain prayed with the patient while family was present. The patient was sleeping and was not alert during the prayer. The chaplain will follow-up later.  Lavone Neri Chaplain Resident For questions concerning this note please contact me by pager [phone]

## 2019-10-04 NOTE — Progress Notes (Addendum)
Advanced Heart Failure Rounding Note   Subjective:    Events  - cath 09/27/19 3v CAD. EF 20% - right groin hematoma. U/s 3/27 negative for PSA - 3/28 altered mental status due to pain meds. Head CT, ABG and ammonia normal - 09/30/19 CVA, ACS with DES LAD and mid CX - 3/30 Headache with stat CT. EEG- no seizure, cortical dysfunction in the right hemisphere likely secondary to underlying infarct as well as moderate to severe diffuse encephalopathy, nonspecific to etiology. - 3/30 Echo EF 50-55%  Alert and responds to some commands on right but remains nonverbal. Unable to move left sided.   Pulled out IV overnight. IV access has been reestablished.   Pulled out cor trak yesterday. Plan replacement today    No other events overnight   Objective:   Weight Range:  Vital Signs:   Temp:  [98.1 F (36.7 C)-99.6 F (37.6 C)] 99.3 F (37.4 C) (04/02 0700) Pulse Rate:  [90-116] 100 (04/02 0800) Resp:  [0-27] 0 (04/02 0800) BP: (106-171)/(38-98) 164/71 (04/02 0800) SpO2:  [93 %-100 %] 100 % (04/02 0800) Weight:  [69.1 kg] 69.1 kg (04/02 0500) Last BM Date: 10/01/19  Weight change: Filed Weights   10/02/19 0500 10/03/19 0452 10/04/19 0500  Weight: 68.6 kg 69.5 kg 69.1 kg    Intake/Output:   Intake/Output Summary (Last 24 hours) at 10/04/2019 0829 Last data filed at 10/04/2019 0500 Gross per 24 hour  Intake --  Output 505 ml  Net -505 ml    PHYSICAL EXAM: General:  Chronically ill appearing WF, dentition in poor repair. No respiratory difficulty HEENT: left facial drop  Neck: supple. no JVD. Carotids 2+ bilat; no bruits. No lymphadenopathy or thyromegaly appreciated. Cor: PMI nondisplaced. Regular rhythm, mildly tachy rate. No rubs, gallops or murmurs. Lungs: clear Abdomen: soft, nontender, nondistended. No hepatosplenomegaly. No bruits or masses. Good bowel sounds. Extremities: no cyanosis, clubbing, rash, edema Neuro: alert w/ left facial drop, LUE flaccid, nonverbal     Telemetry: sinus tachycardia low 100s    Labs: Basic Metabolic Panel: Recent Labs  Lab 09/29/19 0526 09/30/19 0351 09/30/19 0351 10/01/19 0306 10/01/19 0306 10/01/19 1107 10/01/19 1702 10/02/19 0604 10/03/19 0258 10/04/19 0400  NA  --  141  --  144  --   --   --  143 147* 150*  K  --  3.7  --  4.3  --   --   --  4.2 4.5 4.3  CL  --  107  --  111  --   --   --  111 115* 115*  CO2  --  20*  --  17*  --   --   --  15* 19* 21*  GLUCOSE  --  161*  --  118*  --   --   --  80 105* 109*  BUN  --  12  --  14  --   --   --  17 19 18   CREATININE  --  0.90  --  1.11*  --   --   --  1.47* 1.26* 1.17*  CALCIUM  --  8.8*   < > 9.1   < >  --   --  8.8* 9.2 9.4  MG   < >  --   --   --   --  1.8 1.9 1.8 1.8 1.8  PHOS  --   --   --   --   --  3.0 3.1 3.9  3.6 3.9   < > = values in this interval not displayed.    Liver Function Tests: Recent Labs  Lab 09/29/19 1112  AST 40  ALT 19  ALKPHOS 75  BILITOT 1.0  PROT 4.9*  ALBUMIN 2.3*   No results for input(s): LIPASE, AMYLASE in the last 168 hours. Recent Labs  Lab 09/29/19 1112  AMMONIA 16    CBC: Recent Labs  Lab 09/28/19 0408 09/29/19 0526 09/30/19 0351 10/01/19 0306 10/02/19 0604 10/03/19 0258 10/04/19 0400  WBC 10.3   < > 8.1 11.3* 14.1* 14.7* 12.2*  NEUTROABS 6.6  --   --   --   --   --   --   HGB 14.5   < > 11.2* 10.9* 10.1* 10.5* 11.0*  HCT 43.5   < > 33.5* 33.6* 32.2* 32.8* 35.4*  MCV 94.2   < > 94.6 96.8 100.6* 97.9 100.6*  PLT 240   < > 234 324 321 387 385   < > = values in this interval not displayed.    Cardiac Enzymes: No results for input(s): CKTOTAL, CKMB, CKMBINDEX, TROPONINI in the last 168 hours.  BNP: BNP (last 3 results) No results for input(s): BNP in the last 8760 hours.  ProBNP (last 3 results) No results for input(s): PROBNP in the last 8760 hours.    Other results:  Imaging: ECHOCARDIOGRAM COMPLETE  Result Date: 10/02/2019    ECHOCARDIOGRAM REPORT   Patient Name:   TAMU GOLZ Date of Exam: 10/02/2019 Medical Rec #:  [id number]     Height:       62.0 in Accession #:    [id number]    Weight:       151.2 lb Date of Birth:  1950-12-23     BSA:          1.698 m Patient Age:    68 years      BP:           158/62 mmHg Patient Gender: F             HR:           88 bpm. Exam Location:  Inpatient Procedure: 2D Echo, Cardiac Doppler, Color Doppler and Strain Analysis Indications:    I50.40* Unspecified combined systolic (congestive) and diastolic                 (congestive) heart failure  History:        Patient has prior history of Echocardiogram examinations, most                 recent 09/27/2019. CHF, Acute MI and CAD, Abnormal ECG; Stroke.                 Post cardiac cath.  Sonographer:    Sheralyn Boatman RDCS Referring Phys: [phone] AMY D CLEGG IMPRESSIONS  1. Left ventricular ejection fraction, by estimation, is 50 to 55%. The left ventricle has low normal function. The left ventricle demonstrates regional wall motion abnormalities (see scoring diagram/findings for description). There is mild concentric left ventricular hypertrophy. Left ventricular diastolic parameters are indeterminate. Elevated left ventricular end-diastolic pressure. There is mild hypokinesis of the left ventricular, entire anteroseptal wall. There is mild hypokinesis of the left ventricular, mid-apical anterior wall.  2. Right ventricular systolic function is normal. The right ventricular size is normal. Tricuspid regurgitation signal is inadequate for assessing PA pressure.  3. The mitral valve is normal in structure. Trivial mitral valve regurgitation. No evidence  of mitral stenosis.  4. The aortic valve is tricuspid. Aortic valve regurgitation is not visualized. No aortic stenosis is present. Comparison(s): Changes from prior study are noted. Conclusion(s)/Recommendation(s): LVEF significantly improved, with only trivial hypokinesis remaining in anterior/anteroseptal walls. FINDINGS  Left Ventricle: Left ventricular  ejection fraction, by estimation, is 50 to 55%. The left ventricle has low normal function. The left ventricle demonstrates regional wall motion abnormalities. Mild hypokinesis of the left ventricular, entire anteroseptal wall. Mild hypokinesis of the left ventricular, mid-apical anterior wall. The left ventricular internal cavity size was normal in size. There is mild concentric left ventricular hypertrophy. Left ventricular diastolic parameters are indeterminate. Elevated left ventricular end-diastolic pressure. Right Ventricle: The right ventricular size is normal. No increase in right ventricular wall thickness. Right ventricular systolic function is normal. Tricuspid regurgitation signal is inadequate for assessing PA pressure. Left Atrium: Left atrial size was normal in size. Right Atrium: Right atrial size was normal in size. Pericardium: There is no evidence of pericardial effusion. Mitral Valve: The mitral valve is normal in structure. Trivial mitral valve regurgitation. No evidence of mitral valve stenosis. Tricuspid Valve: The tricuspid valve is normal in structure. Tricuspid valve regurgitation is trivial. No evidence of tricuspid stenosis. Aortic Valve: The aortic valve is tricuspid. Aortic valve regurgitation is not visualized. No aortic stenosis is present. Pulmonic Valve: The pulmonic valve was not well visualized. Pulmonic valve regurgitation is trivial. No evidence of pulmonic stenosis. Aorta: The aortic root and ascending aorta are structurally normal, with no evidence of dilitation. Venous: The inferior vena cava was not well visualized. IAS/Shunts: The atrial septum is grossly normal.  LEFT VENTRICLE PLAX 2D LVIDd:         4.00 cm      Diastology LVIDs:         2.90 cm      LV e' lateral:   5.66 cm/s LV PW:         1.40 cm      LV E/e' lateral: 18.9 LV IVS:        1.50 cm      LV e' medial:    4.35 cm/s LVOT diam:     1.90 cm      LV E/e' medial:  24.6 LV SV:         70 LV SV Index:   41 LVOT  Area:     2.84 cm  LV Volumes (MOD) LV vol d, MOD A2C: 101.0 ml LV vol d, MOD A4C: 93.9 ml LV vol s, MOD A2C: 40.4 ml LV vol s, MOD A4C: 50.4 ml LV SV MOD A2C:     60.6 ml LV SV MOD A4C:     93.9 ml LV SV MOD BP:      54.7 ml RIGHT VENTRICLE             IVC RV S prime:     17.20 cm/s  IVC diam: 1.60 cm TAPSE (M-mode): 2.5 cm LEFT ATRIUM             Index       RIGHT ATRIUM           Index LA diam:        2.80 cm 1.65 cm/m  RA Area:     14.70 cm LA Vol (A2C):   47.4 ml 27.92 ml/m RA Volume:   38.30 ml  22.56 ml/m LA Vol (A4C):   26.7 ml 15.73 ml/m LA Biplane Vol: 37.1 ml 21.85 ml/m  AORTIC  VALVE LVOT Vmax:   153.00 cm/s LVOT Vmean:  97.200 cm/s LVOT VTI:    0.246 m  AORTA Ao Root diam: 3.20 cm Ao Asc diam:  3.20 cm MITRAL VALVE MV Area (PHT): 2.62 cm     SHUNTS MV Decel Time: 289 msec     Systemic VTI:  0.25 m MV E velocity: 107.00 cm/s  Systemic Diam: 1.90 cm MV A velocity: 148.00 cm/s MV E/A ratio:  0.72 Buford Dresser MD Electronically signed by Buford Dresser MD Signature Date/Time: 10/02/2019/6:00:57 PM    Final      Medications:     Scheduled Medications: . aspirin  300 mg Rectal Daily   Or  . aspirin  81 mg Per Tube Daily  . atorvastatin  80 mg Per Tube q1800  . chlorhexidine  15 mL Mouth Rinse BID  . Chlorhexidine Gluconate Cloth  6 each Topical Daily  . clopidogrel  75 mg Per Tube Q breakfast  . enoxaparin (LOVENOX) injection  40 mg Subcutaneous Q24H  . mouth rinse  15 mL Mouth Rinse q12n4p  . sodium chloride flush  3 mL Intravenous Q12H  . sodium chloride flush  3 mL Intravenous Q12H    Infusions: . sodium chloride    . sodium chloride    . feeding supplement (OSMOLITE 1.2 CAL) 1,000 mL (10/02/19 1157)    PRN Medications: sodium chloride, sodium chloride, acetaminophen **OR** acetaminophen, metoprolol tartrate, nitroGLYCERIN, ondansetron (ZOFRAN) IV, sodium chloride flush, sodium chloride flush   Assessment/Plan:   1. CAD with NSTEMI - cath with severe  diffuse 3v CAD particularly on left side with severe LV dysfunction. Ideally will need CABG, however in looking at her films worry about the quality of her LAD target and also not sure she would benefit much from a graft to the RCA. Concerned about her mobility especially after recent fall an groin hematoma and low albumin - s/p PCI of pLAD w DES x 1 + PCI of mLCX w/ DES x 1 - CMRI- LVEF 29% RV 37% Global hypokinesis.  - 2D echo 4/1 showed improved EF, up to 50-55% - continue ASA + Plavix and statin  2. Acute systolic HF due to NICM - Initial EF ~25% - repeat echo 3/31, EF normalized, 50-55% - Volume status stable.  - Off entresto, spiro, bb and allow permissive HTN.   3. R groin hematoma after fall - has hematoma at cath site.  - u/s negative for PSA - CT + hematoma. No mention of fracture but not completely imaged. - Hgb stable 11 .  - ON lovenox for DVT prophylaxis.  4. Hypokalemia/Hypomag - K 4.3, Mg 1.8 today  - Holding spiro for now to allow permissive HTN   5. Severe protein calorie malnutrition  - albumin 2.3 - plan cor-trak today   6.  Physical deconditioning - PT/OT/Speech following   7 . Stroke, Acute  -CT 3/28 No acute findings  -CT 3/29 at 0500 R basal infact, small r pontine infact.  -CT 3/30 - Acute infarct centered in the right parietal lobe correlating with perfusion defects yesterday.2. Age-indeterminate infarct at the right caudate that is stable from yesterday. - Neuro following.  - PT/OT/Speech and CIR following   - Failed swallow evaluation. Cortrak today.   8. Hypernatremia - Na 150. Suspect due to lack of intake - plan cor trak insertion today - reestablish IV access   Plan transfer to 3W today    Length of Stay: [place] PA-C  10/04/2019, 8:29 AM  Advanced Heart Failure Team Pager [phone] (M-F; 7a - 4p)  Please contact [facility] for night-coverage after hours (4p -7a ) and weekends on [email or web address]  Patient seen and  examined with the above-signed Advanced Practice Provider and/or Housestaff. I personally reviewed laboratory data, imaging studies and relevant notes. I independently examined the patient and formulated the important aspects of the plan. I have edited the note to reflect any of my changes or salient points. I have personally discussed the plan with the patient and/or family.  More alert today and using R side. Remains with dense aphasia and left-sided weakness UE >>LE.  Denies CP or SOB.   Exam as above (with my edits)  Replace cor-trak today. Hopefull transfer to 3w (stroke unit) until approved for CIR. Continue PT/OT/speech.   CAD and HF stable. EF improved. Wil start adding cardiac meds back soon.   Arvilla Meres, MD  10:14 AM

## 2019-10-04 NOTE — Progress Notes (Signed)
Pt transferred to 3W with all belongings including phone and charger, dentures, and misc. Hygiene items. Son called and notified of transfer.

## 2019-10-04 NOTE — Procedures (Signed)
Cortrak  Person Inserting Tube:  Saretta Dahlem, Verdon Cummins, RD Tube Type:  Cortrak - 43 inches Tube Location:  Left nare Initial Placement:  Stomach Secured by: Clip Technique Used to Measure Tube Placement:  Documented cm marking at nare/ corner of mouth Cortrak Secured At:  67 cm    Cortrak Tube Team Note:  Consult received to place a Cortrak feeding tube.   No x-ray is required. RN may begin using tube.    If the tube becomes dislodged please keep the tube and contact the Cortrak team at [email or web address] ([email or web address]) for replacement.  If after hours and replacement cannot be delayed, place a NG tube and confirm placement with an abdominal x-ray.    Eugene Gavia, MS, RD, LDN RD pager number and weekend/on-call pager number located in [place].

## 2019-10-04 NOTE — Progress Notes (Signed)
Patient arrived to 3W18 from 2heart ICU.  In transfer, patient removed her NG securement and NG was taped to left nare. Patient with incomphrensible speech, moaning, and not following commands upon arrival.  Pt with purewick in place.  Linens were soiled and changed.  In process of cleaning patient, patient pulled onto NGT causing slightly bleeding to left nare, but NGT still secured with tape. VSS. Tele placed and confirmed with [facility].    10/04/19 1425  Vitals  Temp 99 F (37.2 C)  Temp Source Oral  BP (!) 171/72  MAP (mmHg) 100  BP Location Left Arm  BP Method Automatic  Patient Position (if appropriate) Lying  Pulse Rate (!) 108  Resp 16  Oxygen Therapy  SpO2 100 %  O2 Device Room Air  MEWS Score  MEWS Temp 0  MEWS Systolic 0  MEWS Pulse 1  MEWS RR 0  MEWS LOC 1  MEWS Score 2  MEWS Score Color Yellow

## 2019-10-04 NOTE — Progress Notes (Signed)
Physical Therapy Treatment Patient Details Name: Brittney Kennedy MRN: [id number] DOB: Jun 13, 1951 Today's Date: 10/04/2019    History of Present Illness 69yo female with onset of N&V and epigastric pain intermittently for a week, admitted with NSTEMI, cath 3/26 shows severe multivessel CAD, to cone 3.27 with groin hematom.  AMS 3/28 from pain meds, RR 3/29 with AMS and R facial droop.  New CT with  R basal ganglia infarct in light of CABG being planned.  Newest EF is 29%.   PMHx:  R pontine infarct, ischemic changes on CT L putamen, aortic atherosclerosis, CAD. cholecystectomy, PUD, CHF,     PT Comments    Patient slow to initiate participation in sitting and standing activities today.  Eventually able to stand with +1 max A but initially needing max +2 A and lifting help with pad under hips.  More lethargic keeping eyes closed despite attempting to engage with her cell phone and combing her hair at EOB.  She seems has limited awareness of her L side.  Feel she will need SNF level rehab at d/c due to limited help at home and still flaccid with limited L side awareness.  PT to follow.  Follow Up Recommendations  SNF      Equipment Recommendations  None recommended by PT    Recommendations for Other Services       Precautions / Restrictions Precautions Precautions: Fall    Mobility  Bed Mobility Overal bed mobility: Needs Assistance Bed Mobility: Rolling;Sidelying to Sit;Sit to Supine Rolling: Max assist Sidelying to sit: Mod assist;+2 for safety/equipment   Sit to supine: Mod assist;+2 for safety/equipment   General bed mobility comments: placed L hand across lap and flexed L knee for rolling to R, assist from side to sit with legs off bed, cues to use L UE to push up and mod A for righting trunk; to supine assist for legs onto bed and positioning  Transfers Overall transfer level: Needs assistance Equipment used: 2 person hand held assist Transfers: Sit to/from Stand   Stand  pivot transfers: Max assist;+2 physical assistance;Mod assist       General transfer comment: lifting with pad under hips z 2 to stand, pt not engaging with L and anterior lean; stood for changing pads under her with +1 max A and pt more engaged and supporting trunk; deferred OOB for nursing due to pending transfer to 3W and needing R hand restrained for preventing dislodging panda  Ambulation/Gait                 Stairs             Wheelchair Mobility    Modified Rankin (Stroke Patients Only) Modified Rankin (Stroke Patients Only) Pre-Morbid Rankin Score: No symptoms Modified Rankin: Severe disability     Balance Overall balance assessment: Needs assistance   Sitting balance-Leahy Scale: Poor Sitting balance - Comments: mod A for L lateral lean, assist for head righting, attempted to engage in using phone sitting EOB and in combing her hair, with little participation, eyes mostly shut, assist for combing hair about 7 minutes at EOB with mod A for balance     Standing balance-Leahy Scale: Zero Standing balance comment: +2 max A for standing                            Cognition Arousal/Alertness: Lethargic Behavior During Therapy: Flat affect Overall Cognitive Status: Difficult to assess Area of Impairment: Attention;Safety/judgement;Problem solving  Current Attention Level: Focused   Following Commands: Follows one step commands inconsistently;Follows one step commands with increased time Safety/Judgement: Decreased awareness of deficits;Decreased awareness of safety   Problem Solving: Slow processing;Decreased initiation;Difficulty sequencing;Requires verbal cues;Requires tactile cues General Comments: L neglect      Exercises      General Comments        Pertinent Vitals/Pain Faces Pain Scale: No hurt    Home Living                      Prior Function            PT Goals (current goals can now  be found in the care plan section) Progress towards PT goals: Progressing toward goals;Goals downgraded-see care plan    Frequency    Min 3X/week      PT Plan Discharge plan needs to be updated    Co-evaluation              AM-PAC PT "6 Clicks" Mobility   Outcome Measure  Help needed turning from your back to your side while in a flat bed without using bedrails?: Total Help needed moving from lying on your back to sitting on the side of a flat bed without using bedrails?: A Lot Help needed moving to and from a bed to a chair (including a wheelchair)?: Total Help needed standing up from a chair using your arms (e.g., wheelchair or bedside chair)?: Total Help needed to walk in hospital room?: Total Help needed climbing 3-5 steps with a railing? : Total 6 Click Score: 7    End of Session Equipment Utilized During Treatment: Gait belt Activity Tolerance: Patient limited by lethargy Patient left: in bed;with call bell/phone within reach;with bed alarm set;with restraints reapplied Nurse Communication: Mobility status PT Visit Diagnosis: Other abnormalities of gait and mobility (R26.89);Muscle weakness (generalized) (M62.81);Hemiplegia and hemiparesis;Other symptoms and signs involving the nervous system (R29.898) Hemiplegia - Right/Left: Left Hemiplegia - dominant/non-dominant: Non-dominant Hemiplegia - caused by: Cerebral infarction     Time: 1202-1230 PT Time Calculation (min) (ACUTE ONLY): 28 min  Charges:  $Therapeutic Activity: 8-22 mins $Neuromuscular Re-education: 8-22 mins                     Brittney Kennedy, Brittney Kennedy Acute Rehabilitation Services [phone] 10/04/2019    Brittney Kennedy 10/04/2019, 1:00 PM

## 2019-10-04 NOTE — Progress Notes (Signed)
RD on call with Cortrak team paged regarding patient removing new NGT placed today. RD reports that she is booked until end of day and patient has now upon multiple occasions x4, removed her NGT.  Consulting civil engineer notified.  Will notify MD tube feeds on hold, NGT removed by patient.

## 2019-10-04 NOTE — Progress Notes (Signed)
Son and brother's patient at bedside, discussed with the son regarding pt's care, soft restraint was d/c as pt had already pulled the cortrak off, mouth care rendered, reminded the pt that she is still NPO but will increase mouth care frequencies, will continue to monitor

## 2019-10-05 ENCOUNTER — Inpatient Hospital Stay (HOSPITAL_COMMUNITY): Payer: Medicare Other

## 2019-10-05 ENCOUNTER — Encounter (HOSPITAL_COMMUNITY): Payer: Medicare Other

## 2019-10-05 LAB — BASIC METABOLIC PANEL
Anion gap: 14 (ref 5–15)
BUN: 17 mg/dL (ref 8–23)
CO2: 23 mmol/L (ref 22–32)
Calcium: 9.4 mg/dL (ref 8.9–10.3)
Chloride: 116 mmol/L — ABNORMAL HIGH (ref 98–111)
Creatinine, Ser: 1.14 mg/dL — ABNORMAL HIGH (ref 0.44–1.00)
GFR calc Af Amer: 58 mL/min — ABNORMAL LOW (ref >60)
GFR calc non Af Amer: 50 mL/min — ABNORMAL LOW (ref >60)
Glucose, Bld: 128 mg/dL — ABNORMAL HIGH (ref 70–99)
Potassium: 3.8 mmol/L (ref 3.5–5.1)
Sodium: 153 mmol/L — ABNORMAL HIGH (ref 135–145)

## 2019-10-05 LAB — CBC
HCT: 34.2 % — ABNORMAL LOW (ref 36.0–46.0)
Hemoglobin: 10.7 g/dL — ABNORMAL LOW (ref 12.0–15.0)
MCH: 31.7 pg (ref 26.0–34.0)
MCHC: 31.3 g/dL (ref 30.0–36.0)
MCV: 101.2 fL — ABNORMAL HIGH (ref 80.0–100.0)
Platelets: 325 10*3/uL (ref 150–400)
RBC: 3.38 MIL/uL — ABNORMAL LOW (ref 3.87–5.11)
RDW: 14 % (ref 11.5–15.5)
WBC: 10.3 10*3/uL (ref 4.0–10.5)
nRBC: 0 % (ref 0.0–0.2)

## 2019-10-05 LAB — GLUCOSE, CAPILLARY
Glucose-Capillary: 110 mg/dL — ABNORMAL HIGH (ref 70–99)
Glucose-Capillary: 153 mg/dL — ABNORMAL HIGH (ref 70–99)
Glucose-Capillary: 134 mg/dL — ABNORMAL HIGH (ref 70–99)
Glucose-Capillary: 143 mg/dL — ABNORMAL HIGH (ref 70–99)

## 2019-10-05 LAB — MAGNESIUM: Magnesium: 1.8 mg/dL (ref 1.7–2.4)

## 2019-10-05 LAB — PHOSPHORUS: Phosphorus: 3.6 mg/dL (ref 2.5–4.6)

## 2019-10-05 MED ORDER — POTASSIUM CL IN DEXTROSE 5% 20 MEQ/L IV SOLN
20.0000 meq | INTRAVENOUS | Status: AC
  Administered 2019-10-05: 20 meq via INTRAVENOUS
  Filled 2019-10-05: qty 1000

## 2019-10-05 MED ORDER — RESOURCE THICKENUP CLEAR PO POWD
ORAL | Status: DC | PRN
  Filled 2019-10-05: qty 125

## 2019-10-05 MED ORDER — ACETAMINOPHEN 325 MG PO TABS
650.0000 mg | ORAL_TABLET | ORAL | Status: DC | PRN
  Administered 2019-10-05 – 2019-10-12 (×3): 650 mg via ORAL
  Filled 2019-10-05 (×2): qty 2

## 2019-10-05 MED ORDER — ACETAMINOPHEN 650 MG RE SUPP: 650.0000 mg | RECTAL | Status: DC | PRN

## 2019-10-05 NOTE — Progress Notes (Signed)
  Speech Language Pathology Treatment: Dysphagia  Patient Details Name: Brittney Kennedy MRN: [id number] DOB: Sep 04, 1950 Today's Date: 10/05/2019 Time: 2395-3202 SLP Time Calculation (min) (ACUTE ONLY): 25 min  Assessment / Plan / Recommendation Clinical Impression  Patient seen to assess for readiness for objective swallow study. Patient was lethargic but able to follow commands and respond verbally to questions. When SLP asked her how she was feeling she said "Im dying of thirst". Mouth was moist and clear of secretions, etc. Patient consumed 6 ice chips without exhibiting any overt s/s of aspiration or penetration. Dentures not in place during this session, and patient did not attempt any mastication of ice chips (she sucked on them until they melted). As plan for this session was mainly to determine ability for patient to participate in MBS, no further PO's were given so as not to fatigue her.    HPI HPI: 69 y/o female with hx of CAD, cholecystectomy, presented to ED with nausea/vomiting, epigastric pain for 1 week intermittently.  Admitted for NSTEMI, cath 3/26 revealed severe multivessel CAD and transferred to [facility].  Fall 3/27 with R groin hematoma.  Found AMS 3/28 due to pain meds.  Rapid response 3/29 due to AMS and L facial droop/weakness. CT revealed right basal ganglia infarct.  3/29 pm with worsening deficits; STAT CT head with right parietal infarct.       SLP Plan  Continue with current plan of care;MBS       Recommendations  Diet recommendations: NPO Medication Administration: Crushed with puree                Oral Care Recommendations: Oral care QID;Oral care prior to ice chip/H20 Follow up Recommendations: 24 hour supervision/assistance;Other (comment)(TBD pending progress) SLP Visit Diagnosis: Dysphagia, oropharyngeal phase (R13.12) Plan: Continue with current plan of care;MBS         Angela Nevin, MA, CCC-SLP Speech Therapy [facility] Acute Rehab Pager:  [phone]

## 2019-10-05 NOTE — Progress Notes (Signed)
Advanced Heart Failure Rounding Note   Subjective:    Events  - cath 09/27/19 3v CAD. EF 20% - right groin hematoma. U/s 3/27 negative for PSA - 3/28 altered mental status due to pain meds. Head CT, ABG and ammonia normal - 09/30/19 CVA, ACS with DES LAD and mid CX - 3/30 Headache with stat CT. EEG- no seizure, cortical dysfunction in the right hemisphere likely secondary to underlying infarct as well as moderate to severe diffuse encephalopathy, nonspecific to etiology. - 3/30 Echo EF 50-55%  Continues to be more alert and more verbal.  Unfortunately pulled out her second cor- track tube yesterday.   Had a bowel movement this morning in bed and was throwing the poop around the room.  She continues to say she is very thirsty and wants a glass of water.  The nurses are swabbing her mouth.  Still with some aphasia and marked left upper extremity weakness.  Left lower extremity seems to be improving.  No chest pain or shortness of breath.   Objective:   Weight Range:  Vital Signs:   Temp:  [98.1 F (36.7 C)-99 F (37.2 C)] 98.2 F (36.8 C) (04/03 0937) Pulse Rate:  [102-112] 108 (04/03 0937) Resp:  [10-22] 17 (04/03 0937) BP: (141-195)/(65-106) 141/88 (04/03 0937) SpO2:  [95 %-100 %] 96 % (04/03 0937) Weight:  [75.3 kg] 75.3 kg (04/03 0500) Last BM Date: 10/01/19  Weight change: Filed Weights   10/03/19 0452 10/04/19 0500 10/05/19 0500  Weight: 69.5 kg 69.1 kg 75.3 kg    Intake/Output:   Intake/Output Summary (Last 24 hours) at 10/05/2019 1107 Last data filed at 10/05/2019 0942 Gross per 24 hour  Intake 3 ml  Output --  Net 3 ml    PHYSICAL EXAM: General:  Lying in bed. Somewhat agitated. Asking for water No resp difficulty HEENT: normal + left facial droop  Neck: supple. no JVD. Carotids 2+ bilat; no bruits. No lymphadenopathy or thryomegaly appreciated. Cor: PMI nondisplaced.  Tachycardic and regular. No rubs, gallops or murmurs. Lungs: clear Abdomen: soft,  nontender, nondistended. No hepatosplenomegaly. No bruits or masses. Good bowel sounds. Extremities: no cyanosis, clubbing, rash, edema large right groin ecchymosis Neuro: alert & orientedx3, cranial nerves grossly intact.  Left facial droop.  Left upper extremity flaccid.  Can move left lower extremity but weak.    Telemetry: sinus tachycardia low 100-110.  Personally reviewed   Labs: Basic Metabolic Panel: Recent Labs  Lab 10/01/19 0306 10/01/19 0306 10/01/19 1107 10/01/19 1702 10/02/19 0604 10/02/19 0604 10/03/19 0258 10/04/19 0400 10/05/19 0350  NA 144  --   --   --  143  --  147* 150* 153*  K 4.3  --   --   --  4.2  --  4.5 4.3 3.8  CL 111  --   --   --  111  --  115* 115* 116*  CO2 17*  --   --   --  15*  --  19* 21* 23  GLUCOSE 118*  --   --   --  80  --  105* 109* 128*  BUN 14  --   --   --  17  --  19 18 17   CREATININE 1.11*  --   --   --  1.47*  --  1.26* 1.17* 1.14*  CALCIUM 9.1   < >  --   --  8.8*   < > 9.2 9.4 9.4  MG  --   --    < >  1.9 1.8  --  1.8 1.8 1.8  PHOS  --   --    < > 3.1 3.9  --  3.6 3.9 3.6   < > = values in this interval not displayed.    Liver Function Tests: Recent Labs  Lab 09/29/19 1112  AST 40  ALT 19  ALKPHOS 75  BILITOT 1.0  PROT 4.9*  ALBUMIN 2.3*   No results for input(s): LIPASE, AMYLASE in the last 168 hours. Recent Labs  Lab 09/29/19 1112  AMMONIA 16    CBC: Recent Labs  Lab 10/01/19 0306 10/02/19 0604 10/03/19 0258 10/04/19 0400 10/05/19 0350  WBC 11.3* 14.1* 14.7* 12.2* 10.3  HGB 10.9* 10.1* 10.5* 11.0* 10.7*  HCT 33.6* 32.2* 32.8* 35.4* 34.2*  MCV 96.8 100.6* 97.9 100.6* 101.2*  PLT 324 321 387 385 325    Cardiac Enzymes: No results for input(s): CKTOTAL, CKMB, CKMBINDEX, TROPONINI in the last 168 hours.  BNP: BNP (last 3 results) No results for input(s): BNP in the last 8760 hours.  ProBNP (last 3 results) No results for input(s): PROBNP in the last 8760 hours.    Other  results:  Imaging: No results found.   Medications:     Scheduled Medications: . amantadine  100 mg Oral BID  . aspirin  300 mg Rectal Daily   Or  . aspirin  81 mg Per Tube Daily  . atorvastatin  80 mg Per Tube q1800  . chlorhexidine  15 mL Mouth Rinse BID  . Chlorhexidine Gluconate Cloth  6 each Topical Daily  . clopidogrel  75 mg Per Tube Q breakfast  . enoxaparin (LOVENOX) injection  40 mg Subcutaneous Q24H  . mouth rinse  15 mL Mouth Rinse q12n4p  . sodium chloride flush  3 mL Intravenous Q12H  . sodium chloride flush  3 mL Intravenous Q12H    Infusions: . sodium chloride    . sodium chloride    . feeding supplement (OSMOLITE 1.2 CAL) 1,000 mL (10/04/19 1122)    PRN Medications: sodium chloride, sodium chloride, acetaminophen **OR** acetaminophen, metoprolol tartrate, nitroGLYCERIN, ondansetron (ZOFRAN) IV, sodium chloride flush, sodium chloride flush   Assessment/Plan:   1. CAD with NSTEMI - cath with severe diffuse 3v CAD particularly on left side with severe LV dysfunction. Ideally will need CABG, however in looking at her films worry about the quality of her LAD target and also not sure she would benefit much from a graft to the RCA. Concerned about her mobility especially after recent fall an groin hematoma and low albumin - s/p PCI of pLAD w DES x 1 + PCI of mLCX w/ DES x 1 - CMRI- LVEF 29% RV 37% Global hypokinesis.  - 2D echo 4/1 showed improved EF, up to 50-55% - No evidence of heart failure on exam.  Denies chest pain. - continue ASA + Plavix and statin -We will need to continue giving Plavix crushed in applesauce until we have formal enteral access where she passes her swallow study.  2. Acute systolic HF due to NICM - Initial EF ~25% - repeat echo 3/31, EF normalized, 50-55% - Volume status stable.  - Off entresto, spiro, bb and allow permissive HTN.  - We will begin to restart meds soon once enteral access is established  3. R groin hematoma  after fall - has hematoma at cath site.  - u/s negative for PSA - CT + hematoma. No mention of fracture but not completely imaged. - Hgb stable 9.7 - ON  lovenox for DVT prophylaxis.  4. Hypokalemia/Hypomag - K 3.8, Mg 1.8 today  -Can supplement IV as needed   5. Severe protein calorie malnutrition  - albumin 2.3 - plan cor-trak today   6.  Physical deconditioning - PT/OT/Speech following  -Hopefully can go to CIR soon.  7 . Stroke, Acute  -CT 3/28 No acute findings  -CT 3/29 at 0500 R basal infact, small r pontine infact.  -CT 3/30 - Acute infarct centered in the right parietal lobe correlating with perfusion defects yesterday.2. Age-indeterminate infarct at the right caudate that is stable from yesterday. - Neuro following.  - PT/OT/Speech and CIR following   -Seems to be improving slowly.  Hopefully can transfer to CIR soon. -I spoke to speech therapy personally today.  We will proceed with barium swallow.  They will update her swallow Rex after the study this afternoon.  8. Hypernatremia - Na 150 -> 153.  This is likely driving her thirst. -We will give some D5W today -Await enteral access.   Length of Stay: 8   Arvilla Meres MD 10/05/2019, 11:07 AM  Advanced Heart Failure Team Pager [phone] (M-F; 7a - 4p)  Please contact [facility] for night-coverage after hours (4p -7a ) and weekends on [email or web address]

## 2019-10-05 NOTE — Progress Notes (Addendum)
Modified Barium Swallow Progress Note  Patient Details  Name: Brittney Kennedy MRN: [id number] Date of Birth: 1950-09-29  Today's Date: 10/05/2019  Modified Barium Swallow completed.  Full report located under Chart Review in the Imaging Section.  Brief recommendations include the following:  Clinical Impression  Patient presents with a primary sensory-based dysphagia with swallow initiation delays to level of vallecular sinus with honey thick liquids and puree solids and delays to the pyriform sinus with thin liquids and nectar thick liquids. She had silent aspiration or trace-minimal amount with thin liquids before swallow and during swallow. She had flash penetration with nectar thick liquids, thin liquids and one time with honey thick liquids, but will full clearance from laryngeal vestibule with nectar and honey thick. Patient did exhibit two instances of cough response after aspiration had already occured but this was not effecitve to transit aspirate out. No significant amount of phraryngeal residuals were observed s/p  swallows.    Swallow Evaluation Recommendations       SLP Diet Recommendations: Nectar thick liquid;Ice chips PRN after oral care;Free water protocol after oral care;Dysphagia 1 (Puree) solids   Liquid Administration via: Cup;Spoon   Medication Administration: Whole meds with puree   Supervision: Staff to assist with self feeding;Full supervision/cueing for compensatory strategies   Compensations: Minimize environmental distractions;Slow rate;Small sips/bites   Postural Changes: Seated upright at 90 degrees   Oral Care Recommendations: Oral care BID;Staff/trained caregiver to provide oral care   Other Recommendations: Order thickener from pharmacy;Prohibited food (jello, ice cream, thin soups);Remove water pitcher;Have oral suction available;Clarify dietary restrictions    Pablo Lawrence 10/05/2019,2:42 PM    Angela Nevin, MA, CCC-SLP Speech  Therapy [facility] Acute Rehab Pager: [phone]

## 2019-10-06 ENCOUNTER — Encounter (HOSPITAL_COMMUNITY): Payer: Medicare Other

## 2019-10-06 LAB — BASIC METABOLIC PANEL
Anion gap: 11 (ref 5–15)
BUN: 14 mg/dL (ref 8–23)
CO2: 24 mmol/L (ref 22–32)
Calcium: 9.3 mg/dL (ref 8.9–10.3)
Chloride: 113 mmol/L — ABNORMAL HIGH (ref 98–111)
Creatinine, Ser: 1.01 mg/dL — ABNORMAL HIGH (ref 0.44–1.00)
GFR calc Af Amer: >60 mL/min (ref >60)
GFR calc non Af Amer: 58 mL/min — ABNORMAL LOW (ref >60)
Glucose, Bld: 123 mg/dL — ABNORMAL HIGH (ref 70–99)
Potassium: 3.5 mmol/L (ref 3.5–5.1)
Sodium: 148 mmol/L — ABNORMAL HIGH (ref 135–145)

## 2019-10-06 LAB — CBC
HCT: 35.4 % — ABNORMAL LOW (ref 36.0–46.0)
Hemoglobin: 10.9 g/dL — ABNORMAL LOW (ref 12.0–15.0)
MCH: 31.1 pg (ref 26.0–34.0)
MCHC: 30.8 g/dL (ref 30.0–36.0)
MCV: 100.9 fL — ABNORMAL HIGH (ref 80.0–100.0)
Platelets: 357 10*3/uL (ref 150–400)
RBC: 3.51 MIL/uL — ABNORMAL LOW (ref 3.87–5.11)
RDW: 14 % (ref 11.5–15.5)
WBC: 10.7 10*3/uL — ABNORMAL HIGH (ref 4.0–10.5)
nRBC: 0 % (ref 0.0–0.2)

## 2019-10-06 LAB — GLUCOSE, CAPILLARY
Glucose-Capillary: 112 mg/dL — ABNORMAL HIGH (ref 70–99)
Glucose-Capillary: 118 mg/dL — ABNORMAL HIGH (ref 70–99)
Glucose-Capillary: 128 mg/dL — ABNORMAL HIGH (ref 70–99)
Glucose-Capillary: 133 mg/dL — ABNORMAL HIGH (ref 70–99)
Glucose-Capillary: 153 mg/dL — ABNORMAL HIGH (ref 70–99)
Glucose-Capillary: 155 mg/dL — ABNORMAL HIGH (ref 70–99)

## 2019-10-06 LAB — URINE CULTURE: Culture: >=100000 — AB

## 2019-10-06 LAB — MAGNESIUM: Magnesium: 1.7 mg/dL (ref 1.7–2.4)

## 2019-10-06 LAB — PHOSPHORUS: Phosphorus: 2.2 mg/dL — ABNORMAL LOW (ref 2.5–4.6)

## 2019-10-06 MED ORDER — ASPIRIN 81 MG PO CHEW
81.0000 mg | CHEWABLE_TABLET | Freq: Every day | ORAL | Status: DC
  Administered 2019-10-07: 81 mg via ORAL
  Filled 2019-10-06: qty 1

## 2019-10-06 MED ORDER — ASPIRIN 300 MG RE SUPP: 300.0000 mg | Freq: Every day | RECTAL | Status: DC

## 2019-10-06 MED ORDER — BOOST / RESOURCE BREEZE PO LIQD CUSTOM
1.0000 | Freq: Two times a day (BID) | ORAL | Status: DC
  Administered 2019-10-06 – 2019-10-16 (×15): 1 via ORAL

## 2019-10-06 MED ORDER — SACUBITRIL-VALSARTAN 24-26 MG PO TABS
1.0000 | ORAL_TABLET | Freq: Two times a day (BID) | ORAL | Status: DC
  Administered 2019-10-06 – 2019-10-07 (×4): 1 via ORAL
  Filled 2019-10-06 (×5): qty 1

## 2019-10-06 MED ORDER — CARVEDILOL 3.125 MG PO TABS
3.1250 mg | ORAL_TABLET | Freq: Two times a day (BID) | ORAL | Status: DC
  Administered 2019-10-06: 3.125 mg via ORAL
  Filled 2019-10-06 (×2): qty 1

## 2019-10-06 MED ORDER — CLOPIDOGREL BISULFATE 75 MG PO TABS
75.0000 mg | ORAL_TABLET | Freq: Every day | ORAL | Status: DC
  Administered 2019-10-07 – 2019-10-16 (×10): 75 mg via ORAL
  Filled 2019-10-06 (×10): qty 1

## 2019-10-06 MED ORDER — ATORVASTATIN CALCIUM 80 MG PO TABS
80.0000 mg | ORAL_TABLET | Freq: Every day | ORAL | Status: DC
  Administered 2019-10-06 – 2019-10-15 (×9): 80 mg via ORAL
  Filled 2019-10-06 (×10): qty 1

## 2019-10-06 NOTE — Progress Notes (Signed)
  Speech Language Pathology Treatment: Dysphagia  Patient Details Name: Brittney Kennedy MRN: [id number] DOB: 10/05/1950 Today's Date: 10/06/2019 Time: 7412-8786 SLP Time Calculation (min) (ACUTE ONLY): 13 min  Assessment / Plan / Recommendation Clinical Impression  F/u after yesterday's MBS. Pt's friend and son-in-law present.  Reviewed basic results of MBS with Ms. Harm, particularly decreased sensation and attention to left side, characteristic of R CVA.  Pt consumed several sips of nectar-thick liquid from a cup, able to hold cup with RUE. She demonstrated good toleration, no overt s/s of aspiration, only occasional left anterior bolus loss.  Required intermittent verbal cues to attend to left side of mouth for spillage and to use tongue sweep in left cheek to reduce residue when eating solids. Pt verbalized understanding.  There is persisting dysarthria; intelligibility fluctuates.  Reviewed precautions with family, including waiting at least 30 minutes after eating/drinking and performing oral care prior to giving water/ice chips. They verbalize understanding.  SLP will follow for dysphagia, communication.    HPI HPI: 69 y/o female with hx of CAD, cholecystectomy, presented to ED with nausea/vomiting, epigastric pain for 1 week intermittently.  Admitted for NSTEMI, cath 3/26 revealed severe multivessel CAD and transferred to [facility].  Fall 3/27 with R groin hematoma.  Found AMS 3/28 due to pain meds.  Rapid response 3/29 due to AMS and L facial droop/weakness. CT revealed right basal ganglia infarct.  3/29 pm with worsening deficits; STAT CT head with right parietal infarct.       SLP Plan  Continue with current plan of care       Recommendations  Diet recommendations: Dysphagia 1 (puree);Nectar-thick liquid Liquids provided via: Cup Medication Administration: Crushed with puree Supervision: Staff to assist with self feeding Compensations: Minimize environmental distractions;Slow  rate;Small sips/bites Postural Changes and/or Swallow Maneuvers: Seated upright 90 degrees                Oral Care Recommendations: Oral care QID;Oral care prior to ice chip/H20 Follow up Recommendations: 24 hour supervision/assistance SLP Visit Diagnosis: Dysphagia, oropharyngeal phase (R13.12) Plan: Continue with current plan of care                     Ragan Duhon L. Samson Frederic, MA CCC/SLP Acute Rehabilitation Services Office number [phone] Pager [phone]   [name] 10/06/2019, 11:25 AM

## 2019-10-06 NOTE — Progress Notes (Addendum)
Advanced Heart Failure Rounding Note   Subjective:    Events  - cath 09/27/19 3v CAD. EF 20% - right groin hematoma. U/s 3/27 negative for PSA - 3/28 altered mental status due to pain meds. Head CT, ABG and ammonia normal - 09/30/19 CVA, ACS with DES LAD and mid CX - 3/30 Headache with stat CT. EEG- no seizure, cortical dysfunction in the right hemisphere likely secondary to underlying infarct as well as moderate to severe diffuse encephalopathy, nonspecific to etiology. - 3/30 Echo EF 50-55%  Continues to be more alert and more verbal. Still very weak on left.   Passed swallow study and now on D1 diet. Will not talk much but says she is still thirsty.   8 beat run NSVT last night.    Objective:   Weight Range:  Vital Signs:   Temp:  [97.4 F (36.3 C)-99 F (37.2 C)] 97.5 F (36.4 C) (04/04 1203) Pulse Rate:  [99-106] 99 (04/04 1203) Resp:  [15-20] 18 (04/04 1203) BP: (137-188)/(70-100) 180/96 (04/04 1203) SpO2:  [94 %-100 %] 99 % (04/04 1203) Weight:  [72.6 kg] 72.6 kg (04/04 0528) Last BM Date: 10/05/19  Weight change: Filed Weights   10/04/19 0500 10/05/19 0500 10/06/19 0528  Weight: 69.1 kg 75.3 kg 72.6 kg    Intake/Output:   Intake/Output Summary (Last 24 hours) at 10/06/2019 1248 Last data filed at 10/06/2019 0909 Gross per 24 hour  Intake 1089.96 ml  Output 950 ml  Net 139.96 ml    PHYSICAL EXAM: General:  Lying in bed feeding herself ice. No resp difficulty HEENT: normal + left facial droop Neck: supple. no JVD. Carotids 2+ bilat; no bruits. No lymphadenopathy or thryomegaly appreciated. Cor: PMI nondisplaced. Regular rate & rhythm. No rubs, gallops or murmurs. Lungs: clear Abdomen: soft, nontender, nondistended. No hepatosplenomegaly. No bruits or masses. Good bowel sounds. Extremities: no cyanosis, clubbing, rash, edema Neuro: alert. follows commands. Still somewhat aphasic Weak on left   Telemetry: sinus 90-100   8 beat run NSVT  Personally  reviewed   Labs: Basic Metabolic Panel: Recent Labs  Lab 10/02/19 0604 10/02/19 0604 10/03/19 0258 10/03/19 0258 10/04/19 0400 10/05/19 0350 10/06/19 0439  NA 143  --  147*  --  150* 153* 148*  K 4.2  --  4.5  --  4.3 3.8 3.5  CL 111  --  115*  --  115* 116* 113*  CO2 15*  --  19*  --  21* 23 24  GLUCOSE 80  --  105*  --  109* 128* 123*  BUN 17  --  19  --  18 17 14   CREATININE 1.47*  --  1.26*  --  1.17* 1.14* 1.01*  CALCIUM 8.8*   < > 9.2   < > 9.4 9.4 9.3  MG 1.8  --  1.8  --  1.8 1.8 1.7  PHOS 3.9  --  3.6  --  3.9 3.6 2.2*   < > = values in this interval not displayed.    Liver Function Tests: No results for input(s): AST, ALT, ALKPHOS, BILITOT, PROT, ALBUMIN in the last 168 hours. No results for input(s): LIPASE, AMYLASE in the last 168 hours. No results for input(s): AMMONIA in the last 168 hours.  CBC: Recent Labs  Lab 10/02/19 0604 10/03/19 0258 10/04/19 0400 10/05/19 0350 10/06/19 0439  WBC 14.1* 14.7* 12.2* 10.3 10.7*  HGB 10.1* 10.5* 11.0* 10.7* 10.9*  HCT 32.2* 32.8* 35.4* 34.2* 35.4*  MCV  100.6* 97.9 100.6* 101.2* 100.9*  PLT 321 387 385 325 357    Cardiac Enzymes: No results for input(s): CKTOTAL, CKMB, CKMBINDEX, TROPONINI in the last 168 hours.  BNP: BNP (last 3 results) No results for input(s): BNP in the last 8760 hours.  ProBNP (last 3 results) No results for input(s): PROBNP in the last 8760 hours.    Other results:  Imaging: DG Swallowing Func-Speech Pathology  Result Date: 10/05/2019 Objective Swallowing Evaluation: Type of Study: MBS-Modified Barium Swallow Study  Patient Details Name: Brittney Kennedy MRN: [id number] Date of Birth: 06-11-1951 Today's Date: 10/05/2019 Time: SLP Start Time (ACUTE ONLY): 1115 -SLP Stop Time (ACUTE ONLY): 1145 SLP Time Calculation (min) (ACUTE ONLY): 30 min Past Medical History: Past Medical History: Diagnosis Date . CAD (coronary artery disease)   a. 09/2019 NSTEMI/Cath: LM nl, LAD 90p, 3m, diffuse  mid-dist dzs, LCX 68m AV groove/OM2, RI 90p, RCA dominant, nl, RPDA 70m, EF 25%, glob HK. Marland Kitchen HFrEF (heart failure with reduced ejection fraction) (HCC)   a. 09/2019 LV gram: EF 25%, global HK. Marland Kitchen PUD (peptic ulcer disease)  . Seasonal allergies  Past Surgical History: Past Surgical History: Procedure Laterality Date . CHOLECYSTECTOMY   . CORONARY STENT INTERVENTION N/A 09/30/2019  Procedure: CORONARY STENT INTERVENTION;  Surgeon: Swaziland, Peter M, MD;  Location[facility] INVASIVE CV LAB;  Service: Cardiovascular;  Laterality: N/A; . LEFT HEART CATH AND CORONARY ANGIOGRAPHY N/A 09/30/2019  Procedure: LEFT HEART CATH AND CORONARY ANGIOGRAPHY;  Surgeon: Swaziland, Peter M, MD;  Location: [facility] INVASIVE CV LAB;  Service: Cardiovascular;  Laterality: N/A; . LEFT HEART CATH AND CORS/GRAFTS ANGIOGRAPHY N/A 09/27/2019  Procedure: LEFT HEART CATH AND CORS/GRAFTS ANGIOGRAPHY;  Surgeon: Antonieta Iba, MD;  Location: [facility] INVASIVE CV LAB;  Service: Cardiovascular;  Laterality: N/A; HPI: 69 y/o female with hx of CAD, cholecystectomy, presented to ED with nausea/vomiting, epigastric pain for 1 week intermittently.  Admitted for NSTEMI, cath 3/26 revealed severe multivessel CAD and transferred to [facility].  Fall 3/27 with R groin hematoma.  Found AMS 3/28 due to pain meds.  Rapid response 3/29 due to AMS and L facial droop/weakness. CT revealed right basal ganglia infarct.  3/29 pm with worsening deficits; STAT CT head with right parietal infarct.  Subjective: frequently needed cues to keep head still during exam Assessment / Plan / Recommendation CHL IP CLINICAL IMPRESSIONS 10/05/2019 Clinical Impression Patient presents with a primary sensory-based dysphagia with swallow initiation delays to level of vallecular sinus with honey thick liquids and puree solids and delays to the pyriform sinus with thin liquids and nectar thick liquids. She had silent aspiration or trace-minimal amount before swallow and during swallow. She had flash penetration with  nectar thick liquids, thin liquids and one time with honey thick liquids, but will full clearance from laryngeal vestibule with nectar and honey thick. Patient did exhibit two instances of cough response after aspiration had already occured but this was not effecitve to transit aspirate out. No significant amount of phraryngeal residuals were observed s/p  swallows.  SLP Visit Diagnosis Dysphagia, oropharyngeal phase (R13.12) Attention and concentration deficit following -- Frontal lobe and executive function deficit following -- Impact on safety and function Moderate aspiration risk;Mild aspiration risk   CHL IP TREATMENT RECOMMENDATION 10/05/2019 Treatment Recommendations Therapy as outlined in treatment plan below   Prognosis 10/05/2019 Prognosis for Safe Diet Advancement Good Barriers to Reach Goals Severity of deficits Barriers/Prognosis Comment -- CHL IP DIET RECOMMENDATION 10/05/2019 SLP Diet Recommendations Nectar thick liquid;Ice chips PRN after  oral care;Free water protocol after oral care;Dysphagia 1 (Puree) solids Liquid Administration via Cup;Spoon Medication Administration Whole meds with puree Compensations Minimize environmental distractions;Slow rate;Small sips/bites Postural Changes Seated upright at 90 degrees   CHL IP OTHER RECOMMENDATIONS 10/05/2019 Recommended Consults -- Oral Care Recommendations Oral care BID;Staff/trained caregiver to provide oral care Other Recommendations Order thickener from pharmacy;Prohibited food (jello, ice cream, thin soups);Remove water pitcher;Have oral suction available;Clarify dietary restrictions   CHL IP FOLLOW UP RECOMMENDATIONS 10/05/2019 Follow up Recommendations 24 hour supervision/assistance;Other (comment)   CHL IP FREQUENCY AND DURATION 10/05/2019 Speech Therapy Frequency (ACUTE ONLY) min 3x week Treatment Duration 2 weeks      CHL IP ORAL PHASE 10/05/2019 Oral Phase Impaired Oral - Pudding Teaspoon -- Oral - Pudding Cup -- Oral - Honey Teaspoon -- Oral - Honey Cup --  Oral - Nectar Teaspoon -- Oral - Nectar Cup -- Oral - Nectar Straw -- Oral - Thin Teaspoon -- Oral - Thin Cup -- Oral - Thin Straw -- Oral - Puree Reduced posterior propulsion;Delayed oral transit Oral - Mech Soft -- Oral - Regular -- Oral - Multi-Consistency -- Oral - Pill Reduced posterior propulsion;Decreased bolus cohesion;Delayed oral transit;Other (Comment) Oral Phase - Comment --  CHL IP PHARYNGEAL PHASE 10/05/2019 Pharyngeal Phase Impaired Pharyngeal- Pudding Teaspoon -- Pharyngeal -- Pharyngeal- Pudding Cup -- Pharyngeal -- Pharyngeal- Honey Teaspoon Delayed swallow initiation-vallecula Pharyngeal -- Pharyngeal- Honey Cup Delayed swallow initiation-vallecula;Penetration/Aspiration during swallow Pharyngeal Material enters airway, remains ABOVE vocal cords then ejected out Pharyngeal- Nectar Teaspoon Delayed swallow initiation-pyriform sinuses;Delayed swallow initiation-vallecula Pharyngeal Material does not enter airway Pharyngeal- Nectar Cup Delayed swallow initiation-vallecula;Delayed swallow initiation-pyriform sinuses;Penetration/Aspiration before swallow;Penetration/Aspiration during swallow Pharyngeal Material enters airway, remains ABOVE vocal cords then ejected out Pharyngeal- Nectar Straw NT Pharyngeal -- Pharyngeal- Thin Teaspoon Delayed swallow initiation-pyriform sinuses;Delayed swallow initiation-vallecula;Penetration/Aspiration before swallow Pharyngeal Material enters airway, remains ABOVE vocal cords then ejected out Pharyngeal- Thin Cup Delayed swallow initiation-vallecula;Delayed swallow initiation-pyriform sinuses;Penetration/Aspiration before swallow;Penetration/Aspiration during swallow;Trace aspiration Pharyngeal Material enters airway, passes BELOW cords without attempt by patient to eject out (silent aspiration) Pharyngeal- Thin Straw Delayed swallow initiation-pyriform sinuses;Penetration/Aspiration before swallow;Trace aspiration Pharyngeal Material enters airway, passes BELOW cords  and not ejected out despite cough attempt by patient Pharyngeal- Puree Delayed swallow initiation-vallecula Pharyngeal -- Pharyngeal- Mechanical Soft -- Pharyngeal -- Pharyngeal- Regular -- Pharyngeal -- Pharyngeal- Multi-consistency -- Pharyngeal -- Pharyngeal- Pill Delayed swallow initiation-vallecula Pharyngeal -- Pharyngeal Comment --  CHL IP CERVICAL ESOPHAGEAL PHASE 10/05/2019 Cervical Esophageal Phase WFL Pudding Teaspoon -- Pudding Cup -- Honey Teaspoon -- Honey Cup -- Nectar Teaspoon -- Nectar Cup -- Nectar Straw -- Thin Teaspoon -- Thin Cup -- Thin Straw -- Puree -- Mechanical Soft -- Regular -- Multi-consistency -- Pill -- Cervical Esophageal Comment -- Pablo Lawrence 10/05/2019, 2:41 PM Angela Nevin, MA, CCC-SLP Speech Therapy [facility] Acute Rehab Pager: [phone]               Medications:     Scheduled Medications: . amantadine  100 mg Oral BID  . [START ON 10/07/2019] aspirin  81 mg Oral Daily   Or  . [START ON 10/07/2019] aspirin  300 mg Rectal Daily  . atorvastatin  80 mg Oral q1800  . chlorhexidine  15 mL Mouth Rinse BID  . [START ON 10/07/2019] clopidogrel  75 mg Oral Q breakfast  . enoxaparin (LOVENOX) injection  40 mg Subcutaneous Q24H  . mouth rinse  15 mL Mouth Rinse q12n4p  . sodium chloride flush  3 mL Intravenous Q12H  Infusions: . sodium chloride      PRN Medications: sodium chloride, acetaminophen **OR** acetaminophen, metoprolol tartrate, nitroGLYCERIN, ondansetron (ZOFRAN) IV, Resource ThickenUp Clear   Assessment/Plan:   1. CAD with NSTEMI - cath with severe diffuse 3v CAD particularly on left side with severe LV dysfunction. Ideally will need CABG, however in looking at her films worry about the quality of her LAD target and also not sure she would benefit much from a graft to the RCA. Concerned about her mobility especially after recent fall an groin hematoma and low albumin - s/p PCI of pLAD w DES x 1 + PCI of mLCX w/ DES x 1 - CMRI- LVEF 29% RV 37%  Global hypokinesis.  - 2D echo 4/1 showed improved EF, up to 50-55% - No evidence of heart failure on exam.  Denies chest pain. - continue ASA + Plavix and statin. Now able to take po so getting Plavix crushed in applesauce  2. Acute systolic HF due to NICM - Initial EF ~25% - repeat echo 3/31, EF normalized, 50-55% - Volume status stable.  - Off entresto, spiro, bb and allow permissive HTN.  - BP high will start Entresto and carvedilol now that enteral access established  3. R groin hematoma after fall - has hematoma at cath site.  - u/s negative for PSA - CT + hematoma. No mention of fracture but not completely imaged. - Hgb stable 9.7 -> 10.9 - ON lovenox for DVT prophylaxis.  4. Hypokalemia/Hypomag - K 3.5, Mg 1.7 today  - Will supplement    5. Severe protein calorie malnutrition  - albumin 2.3 - plan cor-trak today   6.  Physical deconditioning - PT/OT/Speech following  -Hopefully can go to CIR soon.  7 . Stroke, Acute  -CT 3/28 No acute findings  -CT 3/29 at 0500 R basal infact, small r pontine infact.  -CT 3/30 - Acute infarct centered in the right parietal lobe correlating with perfusion defects yesterday.2. Age-indeterminate infarct at the right caudate that is stable from yesterday. - Neuro has not seen over the weekend. Will touch base with them again tomorrow.  - PT/OT/Speech and CIR following   - Seems to be improving slowly.  Hopefully can transfer to CIR soon. - Now on D1 diet  8. Hypernatremia - Improving with access to ice and also with IVF yesterday  9. NSVT - start b-blocker and suppl K & Mg   Length of Stay: 9   Maryiah Olvey MD 10/06/2019, 12:48 PM  Advanced Heart Failure Team Pager [phone] (M-F; 7a - 4p)  Please contact [facility] for night-coverage after hours (4p -7a ) and weekends on [email or web address]

## 2019-10-06 NOTE — Progress Notes (Addendum)
CCMD reported 8 beats of V tach, pt asymptomatic, will let MD know, continue to monitor MD paged regarding the rhythm

## 2019-10-07 ENCOUNTER — Inpatient Hospital Stay (HOSPITAL_COMMUNITY): Payer: Medicare Other

## 2019-10-07 DIAGNOSIS — R0989 Other specified symptoms and signs involving the circulatory and respiratory systems: Secondary | ICD-10-CM

## 2019-10-07 LAB — GLUCOSE, CAPILLARY
Glucose-Capillary: 118 mg/dL — ABNORMAL HIGH (ref 70–99)
Glucose-Capillary: 163 mg/dL — ABNORMAL HIGH (ref 70–99)
Glucose-Capillary: 166 mg/dL — ABNORMAL HIGH (ref 70–99)
Glucose-Capillary: 176 mg/dL — ABNORMAL HIGH (ref 70–99)
Glucose-Capillary: 188 mg/dL — ABNORMAL HIGH (ref 70–99)
Glucose-Capillary: 202 mg/dL — ABNORMAL HIGH (ref 70–99)

## 2019-10-07 LAB — CBC
HCT: 35.8 % — ABNORMAL LOW (ref 36.0–46.0)
Hemoglobin: 11.1 g/dL — ABNORMAL LOW (ref 12.0–15.0)
MCH: 31.1 pg (ref 26.0–34.0)
MCHC: 31 g/dL (ref 30.0–36.0)
MCV: 100.3 fL — ABNORMAL HIGH (ref 80.0–100.0)
Platelets: 362 10*3/uL (ref 150–400)
RBC: 3.57 MIL/uL — ABNORMAL LOW (ref 3.87–5.11)
RDW: 13.8 % (ref 11.5–15.5)
WBC: 9.5 10*3/uL (ref 4.0–10.5)
nRBC: 0.2 % (ref 0.0–0.2)

## 2019-10-07 LAB — BASIC METABOLIC PANEL
Anion gap: 12 (ref 5–15)
BUN: 13 mg/dL (ref 8–23)
CO2: 23 mmol/L (ref 22–32)
Calcium: 8.8 mg/dL — ABNORMAL LOW (ref 8.9–10.3)
Chloride: 110 mmol/L (ref 98–111)
Creatinine, Ser: 0.9 mg/dL (ref 0.44–1.00)
GFR calc Af Amer: >60 mL/min (ref >60)
GFR calc non Af Amer: >60 mL/min (ref >60)
Glucose, Bld: 115 mg/dL — ABNORMAL HIGH (ref 70–99)
Potassium: 3.3 mmol/L — ABNORMAL LOW (ref 3.5–5.1)
Sodium: 145 mmol/L (ref 135–145)

## 2019-10-07 MED ORDER — CARVEDILOL 6.25 MG PO TABS
6.2500 mg | ORAL_TABLET | Freq: Two times a day (BID) | ORAL | Status: DC
  Administered 2019-10-07 (×2): 6.25 mg via ORAL
  Filled 2019-10-07: qty 1

## 2019-10-07 MED ORDER — POTASSIUM CHLORIDE CRYS ER 20 MEQ PO TBCR
40.0000 meq | EXTENDED_RELEASE_TABLET | Freq: Two times a day (BID) | ORAL | Status: AC
  Administered 2019-10-07 (×2): 40 meq via ORAL
  Filled 2019-10-07 (×2): qty 2

## 2019-10-07 MED ORDER — INSULIN ASPART 100 UNIT/ML ~~LOC~~ SOLN
0.0000 [IU] | Freq: Three times a day (TID) | SUBCUTANEOUS | Status: DC
  Administered 2019-10-07: 3 [IU] via SUBCUTANEOUS
  Administered 2019-10-08: 5 [IU] via SUBCUTANEOUS
  Administered 2019-10-08 (×2): 3 [IU] via SUBCUTANEOUS
  Administered 2019-10-09 – 2019-10-10 (×2): 2 [IU] via SUBCUTANEOUS
  Administered 2019-10-11 – 2019-10-12 (×2): 3 [IU] via SUBCUTANEOUS
  Administered 2019-10-12 – 2019-10-13 (×3): 2 [IU] via SUBCUTANEOUS
  Administered 2019-10-14: 3 [IU] via SUBCUTANEOUS
  Administered 2019-10-15: 2 [IU] via SUBCUTANEOUS
  Administered 2019-10-15 – 2019-10-16 (×2): 3 [IU] via SUBCUTANEOUS

## 2019-10-07 NOTE — Progress Notes (Signed)
Inpatient Rehabilitation-Admissions Coordinator   Noted continued recommendation for CIR. AC has followed up with pt's son this AM regarding discharge support. Pt's son is working on her DC plan now and will follow up with me tomorrow. He is aware that if there is not sufficient caregiver support planned, she will need to go SNF once medically ready.  AC will follow up tomorrow with more details.  Cheri Rous, OTR/L  Rehab Admissions Coordinator  [phone] 10/07/2019 10:02 AM

## 2019-10-07 NOTE — Progress Notes (Signed)
ABI completed. Refer to "CV Proc" under chart review to view preliminary results.  10/07/2019 10:31 AM Eula Fried., MHA, RVT, RDCS, RDMS

## 2019-10-07 NOTE — Progress Notes (Addendum)
Advanced Heart Failure Rounding Note   Subjective:    Events  - cath 09/27/19 3v CAD. EF 20% - right groin hematoma. U/s 3/27 negative for PSA - 3/28 altered mental status due to pain meds. Head CT, ABG and ammonia normal - 09/30/19 CVA, ACS with DES LAD and mid CX - 3/30 Headache with stat CT. EEG- no seizure, cortical dysfunction in the right hemisphere likely secondary to underlying infarct as well as moderate to severe diffuse encephalopathy, nonspecific to etiology. - 3/30 Echo EF 50-55%  Passed swallow study and now on D1 diet.   Denies SOB. Wants water.    Objective:   Weight Range:  Vital Signs:   Temp:  [97.5 F (36.4 C)-99.3 F (37.4 C)] 99.3 F (37.4 C) (04/05 0323) Pulse Rate:  [92-104] 93 (04/05 0323) Resp:  [18-20] 18 (04/05 0323) BP: (141-184)/(69-96) 145/75 (04/05 0323) SpO2:  [96 %-100 %] 96 % (04/05 0323) Weight:  [74.1 kg] 74.1 kg (04/05 0431) Last BM Date: 10/06/19  Weight change: Filed Weights   10/05/19 0500 10/06/19 0528 10/07/19 0431  Weight: 75.3 kg 72.6 kg 74.1 kg    Intake/Output:   Intake/Output Summary (Last 24 hours) at 10/07/2019 0820 Last data filed at 10/07/2019 0601 Gross per 24 hour  Intake 1083 ml  Output 1250 ml  Net -167 ml    PHYSICAL EXAM: General:   No resp difficulty. Left facial droop  HEENT: normal left facial droop Neck: supple. no JVD. Carotids 2+ bilat; no bruits. No lymphadenopathy or thryomegaly appreciated. Cor: PMI nondisplaced. Regular rate & rhythm. No rubs, gallops or murmurs. Lungs: clear Abdomen: soft, nontender, nondistended. No hepatosplenomegaly. No bruits or masses. Good bowel sounds. Extremities: R groin ecchymotic, no cyanosis, clubbing, rash, edema. LUE flaccid. LLE 4/5  Neuro: alert. Aphasic. Moves all extremities except LUE flaccid and LLE weak.  Affect pleasant  Telemetry: SR with PVCs and brief NSVT   Labs: Basic Metabolic Panel: Recent Labs  Lab 10/02/19 0604 10/02/19 0604  10/03/19 0258 10/03/19 0258 10/04/19 0400 10/04/19 0400 10/05/19 0350 10/06/19 0439 10/07/19 0446  NA 143   < > 147*  --  150*  --  153* 148* 145  K 4.2   < > 4.5  --  4.3  --  3.8 3.5 3.3*  CL 111   < > 115*  --  115*  --  116* 113* 110  CO2 15*   < > 19*  --  21*  --  23 24 23   GLUCOSE 80   < > 105*  --  109*  --  128* 123* 115*  BUN 17   < > 19  --  18  --  17 14 13   CREATININE 1.47*   < > 1.26*  --  1.17*  --  1.14* 1.01* 0.90  CALCIUM 8.8*   < > 9.2   < > 9.4   < > 9.4 9.3 8.8*  MG 1.8  --  1.8  --  1.8  --  1.8 1.7  --   PHOS 3.9  --  3.6  --  3.9  --  3.6 2.2*  --    < > = values in this interval not displayed.    Liver Function Tests: No results for input(s): AST, ALT, ALKPHOS, BILITOT, PROT, ALBUMIN in the last 168 hours. No results for input(s): LIPASE, AMYLASE in the last 168 hours. No results for input(s): AMMONIA in the last 168 hours.  CBC: Recent Labs  Lab 10/03/19 0258 10/04/19 0400 10/05/19 0350 10/06/19 0439 10/07/19 0446  WBC 14.7* 12.2* 10.3 10.7* 9.5  HGB 10.5* 11.0* 10.7* 10.9* 11.1*  HCT 32.8* 35.4* 34.2* 35.4* 35.8*  MCV 97.9 100.6* 101.2* 100.9* 100.3*  PLT 387 385 325 357 362    Cardiac Enzymes: No results for input(s): CKTOTAL, CKMB, CKMBINDEX, TROPONINI in the last 168 hours.  BNP: BNP (last 3 results) No results for input(s): BNP in the last 8760 hours.  ProBNP (last 3 results) No results for input(s): PROBNP in the last 8760 hours.    Other results:  Imaging: DG Swallowing Func-Speech Pathology  Result Date: 10/05/2019 Objective Swallowing Evaluation: Type of Study: MBS-Modified Barium Swallow Study  Patient Details Name: Brittney Kennedy MRN: [id number] Date of Birth: 29-Nov-1950 Today's Date: 10/05/2019 Time: SLP Start Time (ACUTE ONLY): 1115 -SLP Stop Time (ACUTE ONLY): 1145 SLP Time Calculation (min) (ACUTE ONLY): 30 min Past Medical History: Past Medical History: Diagnosis Date . CAD (coronary artery disease)   a. 09/2019 NSTEMI/Cath:  LM nl, LAD 90p, 59m, diffuse mid-dist dzs, LCX 85m AV groove/OM2, RI 90p, RCA dominant, nl, RPDA 61m, EF 25%, glob HK. Marland Kitchen HFrEF (heart failure with reduced ejection fraction) (HCC)   a. 09/2019 LV gram: EF 25%, global HK. Marland Kitchen PUD (peptic ulcer disease)  . Seasonal allergies  Past Surgical History: Past Surgical History: Procedure Laterality Date . CHOLECYSTECTOMY   . CORONARY STENT INTERVENTION N/A 09/30/2019  Procedure: CORONARY STENT INTERVENTION;  Surgeon: Swaziland, Peter M, MD;  Location: [facility] INVASIVE CV LAB;  Service: Cardiovascular;  Laterality: N/A; . LEFT HEART CATH AND CORONARY ANGIOGRAPHY N/A 09/30/2019  Procedure: LEFT HEART CATH AND CORONARY ANGIOGRAPHY;  Surgeon: Swaziland, Peter M, MD;  Location: [facility] INVASIVE CV LAB;  Service: Cardiovascular;  Laterality: N/A; . LEFT HEART CATH AND CORS/GRAFTS ANGIOGRAPHY N/A 09/27/2019  Procedure: LEFT HEART CATH AND CORS/GRAFTS ANGIOGRAPHY;  Surgeon: Antonieta Iba, MD;  Location: [facility] INVASIVE CV LAB;  Service: Cardiovascular;  Laterality: N/A; HPI: 69 y/o female with hx of CAD, cholecystectomy, presented to ED with nausea/vomiting, epigastric pain for 1 week intermittently.  Admitted for NSTEMI, cath 3/26 revealed severe multivessel CAD and transferred to [facility].  Fall 3/27 with R groin hematoma.  Found AMS 3/28 due to pain meds.  Rapid response 3/29 due to AMS and L facial droop/weakness. CT revealed right basal ganglia infarct.  3/29 pm with worsening deficits; STAT CT head with right parietal infarct.  Subjective: frequently needed cues to keep head still during exam Assessment / Plan / Recommendation CHL IP CLINICAL IMPRESSIONS 10/05/2019 Clinical Impression Patient presents with a primary sensory-based dysphagia with swallow initiation delays to level of vallecular sinus with honey thick liquids and puree solids and delays to the pyriform sinus with thin liquids and nectar thick liquids. She had silent aspiration or trace-minimal amount before swallow and during swallow. She  had flash penetration with nectar thick liquids, thin liquids and one time with honey thick liquids, but will full clearance from laryngeal vestibule with nectar and honey thick. Patient did exhibit two instances of cough response after aspiration had already occured but this was not effecitve to transit aspirate out. No significant amount of phraryngeal residuals were observed s/p  swallows.  SLP Visit Diagnosis Dysphagia, oropharyngeal phase (R13.12) Attention and concentration deficit following -- Frontal lobe and executive function deficit following -- Impact on safety and function Moderate aspiration risk;Mild aspiration risk   CHL IP TREATMENT RECOMMENDATION 10/05/2019 Treatment Recommendations Therapy as outlined in treatment plan below  Prognosis 10/05/2019 Prognosis for Safe Diet Advancement Good Barriers to Reach Goals Severity of deficits Barriers/Prognosis Comment -- CHL IP DIET RECOMMENDATION 10/05/2019 SLP Diet Recommendations Nectar thick liquid;Ice chips PRN after oral care;Free water protocol after oral care;Dysphagia 1 (Puree) solids Liquid Administration via Cup;Spoon Medication Administration Whole meds with puree Compensations Minimize environmental distractions;Slow rate;Small sips/bites Postural Changes Seated upright at 90 degrees   CHL IP OTHER RECOMMENDATIONS 10/05/2019 Recommended Consults -- Oral Care Recommendations Oral care BID;Staff/trained caregiver to provide oral care Other Recommendations Order thickener from pharmacy;Prohibited food (jello, ice cream, thin soups);Remove water pitcher;Have oral suction available;Clarify dietary restrictions   CHL IP FOLLOW UP RECOMMENDATIONS 10/05/2019 Follow up Recommendations 24 hour supervision/assistance;Other (comment)   CHL IP FREQUENCY AND DURATION 10/05/2019 Speech Therapy Frequency (ACUTE ONLY) min 3x week Treatment Duration 2 weeks      CHL IP ORAL PHASE 10/05/2019 Oral Phase Impaired Oral - Pudding Teaspoon -- Oral - Pudding Cup -- Oral - Honey  Teaspoon -- Oral - Honey Cup -- Oral - Nectar Teaspoon -- Oral - Nectar Cup -- Oral - Nectar Straw -- Oral - Thin Teaspoon -- Oral - Thin Cup -- Oral - Thin Straw -- Oral - Puree Reduced posterior propulsion;Delayed oral transit Oral - Mech Soft -- Oral - Regular -- Oral - Multi-Consistency -- Oral - Pill Reduced posterior propulsion;Decreased bolus cohesion;Delayed oral transit;Other (Comment) Oral Phase - Comment --  CHL IP PHARYNGEAL PHASE 10/05/2019 Pharyngeal Phase Impaired Pharyngeal- Pudding Teaspoon -- Pharyngeal -- Pharyngeal- Pudding Cup -- Pharyngeal -- Pharyngeal- Honey Teaspoon Delayed swallow initiation-vallecula Pharyngeal -- Pharyngeal- Honey Cup Delayed swallow initiation-vallecula;Penetration/Aspiration during swallow Pharyngeal Material enters airway, remains ABOVE vocal cords then ejected out Pharyngeal- Nectar Teaspoon Delayed swallow initiation-pyriform sinuses;Delayed swallow initiation-vallecula Pharyngeal Material does not enter airway Pharyngeal- Nectar Cup Delayed swallow initiation-vallecula;Delayed swallow initiation-pyriform sinuses;Penetration/Aspiration before swallow;Penetration/Aspiration during swallow Pharyngeal Material enters airway, remains ABOVE vocal cords then ejected out Pharyngeal- Nectar Straw NT Pharyngeal -- Pharyngeal- Thin Teaspoon Delayed swallow initiation-pyriform sinuses;Delayed swallow initiation-vallecula;Penetration/Aspiration before swallow Pharyngeal Material enters airway, remains ABOVE vocal cords then ejected out Pharyngeal- Thin Cup Delayed swallow initiation-vallecula;Delayed swallow initiation-pyriform sinuses;Penetration/Aspiration before swallow;Penetration/Aspiration during swallow;Trace aspiration Pharyngeal Material enters airway, passes BELOW cords without attempt by patient to eject out (silent aspiration) Pharyngeal- Thin Straw Delayed swallow initiation-pyriform sinuses;Penetration/Aspiration before swallow;Trace aspiration Pharyngeal Material  enters airway, passes BELOW cords and not ejected out despite cough attempt by patient Pharyngeal- Puree Delayed swallow initiation-vallecula Pharyngeal -- Pharyngeal- Mechanical Soft -- Pharyngeal -- Pharyngeal- Regular -- Pharyngeal -- Pharyngeal- Multi-consistency -- Pharyngeal -- Pharyngeal- Pill Delayed swallow initiation-vallecula Pharyngeal -- Pharyngeal Comment --  CHL IP CERVICAL ESOPHAGEAL PHASE 10/05/2019 Cervical Esophageal Phase WFL Pudding Teaspoon -- Pudding Cup -- Honey Teaspoon -- Honey Cup -- Nectar Teaspoon -- Nectar Cup -- Nectar Straw -- Thin Teaspoon -- Thin Cup -- Thin Straw -- Puree -- Mechanical Soft -- Regular -- Multi-consistency -- Pill -- Cervical Esophageal Comment -- Dannial Monarch 10/05/2019, 2:41 PM Sonia Baller, MA, CCC-SLP Speech Therapy [facility] Acute Rehab Pager: [phone]               Medications:     Scheduled Medications: . amantadine  100 mg Oral BID  . aspirin  81 mg Oral Daily   Or  . aspirin  300 mg Rectal Daily  . atorvastatin  80 mg Oral q1800  . carvedilol  3.125 mg Oral BID WC  . chlorhexidine  15 mL Mouth Rinse BID  . clopidogrel  75 mg Oral  Q breakfast  . enoxaparin (LOVENOX) injection  40 mg Subcutaneous Q24H  . feeding supplement  1 Container Oral BID BM  . mouth rinse  15 mL Mouth Rinse q12n4p  . sacubitril-valsartan  1 tablet Oral BID  . sodium chloride flush  3 mL Intravenous Q12H    Infusions: . sodium chloride      PRN Medications: sodium chloride, acetaminophen **OR** acetaminophen, metoprolol tartrate, nitroGLYCERIN, ondansetron (ZOFRAN) IV, Resource ThickenUp Clear   Assessment/Plan:   1. CAD with NSTEMI - cath with severe diffuse 3v CAD particularly on left side with severe LV dysfunction. Ideally will need CABG, however in looking at her films worry about the quality of her LAD target and also not sure she would benefit much from a graft to the RCA. Concerned about her mobility especially after recent fall an groin  hematoma and low albumin - s/p PCI of pLAD w DES x 1 + PCI of mLCX w/ DES x 1 - CMRI- LVEF 29% RV 37% Global hypokinesis.  - 2D echo 10/02/19 showed improved EF, up to 50-55% - No chest pain. - continue ASA + Plavix and statin. Now able to take po so getting Plavix crushed in applesauce  2. Acute systolic HF due to NICM - Initial EF ~25% - repeat echo 3/31, EF normalized, 50-55% - Continue entresto 24-26 mg twice a day - Increase carvedilol 6.25  mg twice a day.  - 3. R groin hematoma after fall - has hematoma at cath site.  - u/s negative for PSA - CT + hematoma. No mention of fracture but not completely imaged. - Hgb stable 9.7 -> 10.9->11.1  - ON lovenox for DVT prophylaxis.  4. Hypokalemia/Hypomag - K 3.3.  - Supp K.  - Check mag in am.    5. Severe protein calorie malnutrition  - albumin 2.3  6.  Physical deconditioning - PT/OT/Speech following   7 . Stroke, Acute  -CT 3/28 No acute findings  -CT 3/29 at 0500 R basal infact, small r pontine infact.  -CT 3/30 - Acute infarct centered in the right parietal lobe correlating with perfusion defects yesterday.2. Age-indeterminate infarct at the right caudate that is stable from yesterday. - Improving. Neuro appreciated.  - PT/OT/Speech and CIR following.  - Hopefully can transfer to CIR soon. - Now on D1 diet  8. Hypernatremia - Resolved.   9. NSVT -Supp K. Check MAg in am.  - Increase carvedilol 6.25 mg twice a day.   Hopefully can go to CIR soon. Son working disposition. Hopefully he can work out 24 hour so she can go to [facility]. If not will need SNF.   Length of Stay: 10   Amy Clegg NP-C  10/07/2019, 8:20 AM  Advanced Heart Failure Team Pager [phone] (M-F; 7a - 4p)  Please contact [facility] for night-coverage after hours (4p -7a ) and weekends on [email or web address]  Patient seen and examined with the above-signed Advanced Practice Provider and/or Housestaff. I personally reviewed laboratory data, imaging  studies and relevant notes. I independently examined the patient and formulated the important aspects of the plan. I have edited the note to reflect any of my changes or salient points. I have personally discussed the plan with the patient and/or family.  Remains with significant aphasia and dense left-sided weakness. Now on D1 diet but not eating much.   Denies CP or SOB. ABIs with LLE PAD  General:  Weak appearing  aphasic HEENT: normal + left facial droop  Neck: supple. no  JVD. Carotids 2+ bilat; no bruits. No lymphadenopathy or thryomegaly appreciated. Cor: PMI nondisplaced. Regular rate & rhythm. No rubs, gallops or murmurs. Lungs: clear Abdomen: soft, nontender, nondistended. No hepatosplenomegaly. No bruits or masses. Good bowel sounds. Extremities: no cyanosis, clubbing, rash, edema Neuro: alert & oriented weak on left LUE flaccid   Stable from cardiac perspective. Still with significant CVA symptoms. I have reached out to Neuro for f/u. Will need CIR vs SNF.   Continue cardiac meds including DAPT, statin. BP improved on Entresto.   Arvilla Meres, MD  4:12 PM

## 2019-10-07 NOTE — TOC Initial Note (Addendum)
Transition of Care [facility]) - Initial/Assessment Note    Patient Details  Name: Brittney Kennedy MRN: [id number] Date of Birth: January 15, 1951  [facility] CM/SW Contact:    Geralynn Ochs, LCSW Phone Number: 10/07/2019, 2:44 PM  Clinical Narrative:      CSW met with patient's son, Marden Noble, per his request to discuss discharge plans. Marden Noble indicated that he was thinking about CIR but would need to have 24 hour care at discharge, and that SNF would be an option if 24 hour care is not available. Doug asked about whether insurance would cover any caregiver support, and CSW explained insurance coverage. CSW explained how to apply for Medicaid, as well, if patient would qualify. CSW discussed differences between CIR and SNF, and insurance coverage for SNF. Doug also asked about completing patient's advanced directive, and CSW informed Marden Noble to complete the paperwork and let RN know when it was done to page the chaplains. CSW and Marden Noble attempted to engage patient in discussion several times, but patient kept eyes mostly closed and only said "tired". CSW completed FL2 and faxed out in preparation of disposition choice to have any bed offers available to assist in planning. CSW to follow up tomorrow on family's plan for discharge.    Expected Discharge Plan: IP Rehab Facility Barriers to Discharge: Continued Medical Work up   Patient Goals and CMS Choice Patient states their goals for this hospitalization and ongoing recovery are:: patient unable to state goals due to lethargy, not engaging in discussion CMS Medicare.gov Compare Post Acute Care list provided to:: Patient Represenative (must comment) Choice offered to / list presented to : Adult Children  Expected Discharge Plan and Services Expected Discharge Plan: [place]       Living arrangements for the past 2 months: Single Family Home                                      Prior Living Arrangements/Services Living  arrangements for the past 2 months: Single Family Home Lives with:: Self, Friends Patient language and need for interpreter reviewed:: No Do you feel safe going back to the place where you live?: Yes      Need for Family Participation in Patient Care: Yes (Comment) Care giver support system in place?: No (comment)   Criminal Activity/Legal Involvement Pertinent to Current Situation/Hospitalization: No - Comment as needed  Activities of Daily Living      Permission Sought/Granted Permission sought to share information with : Facility Sport and exercise psychologist, Family Supports Permission granted to share information with : Yes, Verbal Permission Granted  Share Information with NAME: Marden Noble  Permission granted to share info w AGENCY: SNF  Permission granted to share info w Relationship: Son     Emotional Assessment Appearance:: Appears stated age Attitude/Demeanor/Rapport: Unresponsive Affect (typically observed): Withdrawn, Quiet Orientation: : Oriented to Self, Oriented to Place, Oriented to Situation Alcohol / Substance Use: Not Applicable Psych Involvement: No (comment)  Admission diagnosis:  Acute systolic CHF (congestive heart failure) ([place]) [I50.21] Patient Active Problem List   Diagnosis Date Noted  . Malnutrition of moderate degree 10/02/2019  . Cerebral embolism with cerebral infarction 10/01/2019  . CAD (coronary artery disease) 09/27/2019  . Acute systolic CHF (congestive heart failure) ([place]) 09/27/2019  . NSTEMI (non-ST elevated myocardial infarction) ([place]) 09/26/2019   PCP:  Patient, No Pcp Per Pharmacy:   [place], [place] -  [address] [place] [address] Phone: [phone] Fax: [phone]     Social Determinants of Health (SDOH) Interventions    Readmission Risk Interventions No flowsheet data found.

## 2019-10-07 NOTE — Progress Notes (Signed)
Physical Therapy Treatment Patient Details Name: Brittney Kennedy MRN: [id number] DOB: 10/13/1950 Today's Date: 10/07/2019    History of Present Illness 69 year old female to the hospital for nausea, vomiting, epigastric pain x 1 week, admitted 09/27/19 for NSTEMI. cath 3/26 revealed severe multivessel CAD. Patient transferred to [facility]. Fall 3/27 with R groin hematoma. 3/28 patient with AMS due to pain meds and rapid response 3/29 due to AMS and L facial droop with weakness. CT: R basal ganglia infarct. PMH: CAD, cholecystectomy    PT Comments    Patient required maxA for supine<>sit with HOB elevated and use of bedrail on R side. Sitting balance at EOB with RUE support on bedrail or on bed with close supervision to minA. With fatigue, patient with posterior and R lean. MaxA for sit<>stand from EOB with bed height increased. Patient only able to advance LLE to sidestep along EOB with assistance but unable to unweight and advance RLE to sidestep along EOB. Recommend continued skilled PT services and discharge to inpatient rehab. Patient confirms she was independent prior to admission.    Follow Up Recommendations  CIR     Equipment Recommendations  Other (comment)(TBD at next level of care)    Recommendations for Other Services Rehab consult     Precautions / Restrictions Precautions Precautions: Fall;Other (comment) Precaution Comments: aspiration precautions (dysphagia 1 with nectar), HOB >30 degrees Restrictions Weight Bearing Restrictions: No    Mobility  Bed Mobility Overal bed mobility: Needs Assistance   Rolling: Mod assist((roll to right))   Supine to sit: Max assist;HOB elevated(use of bedrail with RUE) Sit to supine: Max assist   General bed mobility comments: Boosting with maxA, draw sheet, and boost function of bed  Transfers Overall transfer level: Needs assistance Equipment used: None Transfers: Sit to/from Stand Sit to Stand: Max assist;From elevated surface          General transfer comment: sit<>stand from EOB with maxA and bed height increased + RUE on bedrail x 2 trials.   Ambulation/Gait Ambulation/Gait assistance: Max assist Gait Distance (Feet): 1 Feet Assistive device: None       General Gait Details: 1 step with LLE to R, unable to advance RLE to sidestep along EOB   Modified Rankin (Stroke Patients Only) Modified Rankin (Stroke Patients Only) Pre-Morbid Rankin Score: No symptoms Modified Rankin: Severe disability     Balance Overall balance assessment: Needs assistance Sitting-balance support: Single extremity supported;Feet supported Sitting balance-Leahy Scale: (Fair- to Poor) Sitting balance - Comments: RUE on bed or bedrail to assist, R and posterior lean especially with fatigue, head/trunk angled toward right Postural control: Posterior lean Standing balance support: Single extremity supported Standing balance-Leahy Scale: Zero Standing balance comment: maxA for standing balance with RUE holding bedrail, standing tolerance less than 10 seconds    Cognition Arousal/Alertness: Awake/alert;Lethargic Behavior During Therapy: Flat affect     Following Commands: Follows one step commands with increased time     Problem Solving: Slow processing General Comments: L inattention/neglect             Pertinent Vitals/Pain Pain Assessment: Faces Faces Pain Scale: No hurt       Prior Function independent   PT Goals (current goals can now be found in the care plan section) Progress towards PT goals: Progressing toward goals    Frequency    Min 3X/week      PT Plan Discharge plan needs to be updated       AM-PAC PT "6 Clicks" Mobility  Outcome Measure  Help needed turning from your back to your side while in a flat bed without using bedrails?: A Lot Help needed moving from lying on your back to sitting on the side of a flat bed without using bedrails?: A Lot Help needed moving to and from a bed to a  chair (including a wheelchair)?: Total Help needed standing up from a chair using your arms (e.g., wheelchair or bedside chair)?: A Lot Help needed to walk in hospital room?: Total Help needed climbing 3-5 steps with a railing? : Total 6 Click Score: 9    End of Session Equipment Utilized During Treatment: Gait belt Activity Tolerance: Patient limited by lethargy Patient left: in bed;with call bell/phone within reach;with bed alarm set Nurse Communication: Mobility status PT Visit Diagnosis: Other abnormalities of gait and mobility (R26.89);Muscle weakness (generalized) (M62.81);Hemiplegia and hemiparesis;Other symptoms and signs involving the nervous system (R29.898) Hemiplegia - Right/Left: Left Hemiplegia - dominant/non-dominant: Non-dominant     Time: 9622-2979 PT Time Calculation (min) (ACUTE ONLY): 27 min  Charges:  $Therapeutic Activity: 23-37 mins                    Birdie Hopes, PT, DPT Acute Rehab [phone] office     Birdie Hopes 10/07/2019, 9:39 AM

## 2019-10-07 NOTE — NC FL2 (Signed)
[place] MEDICAID FL2 LEVEL OF CARE SCREENING TOOL     IDENTIFICATION  Patient Name: Brittney Kennedy Birthdate: December 07, 1950 Sex: female Admission Date (Current Location): 09/27/2019  [facility] and [place] Number:  Chiropodist and Address:  [facility], [address], [place], [place] [address]      Provider Number: [id number]  Attending Physician Name and Address:  Dolores Patty, MD  Relative Name and Phone Number:       Current Level of Care: Hospital Recommended Level of Care: Skilled Nursing Facility Prior Approval Number:    Date Approved/Denied:   PASRR Number: [id number]  Discharge Plan: SNF    Current Diagnoses: Patient Active Problem List   Diagnosis Date Noted  . Malnutrition of moderate degree 10/02/2019  . Cerebral embolism with cerebral infarction 10/01/2019  . CAD (coronary artery disease) 09/27/2019  . Acute systolic CHF (congestive heart failure) (HCC) 09/27/2019  . NSTEMI (non-ST elevated myocardial infarction) (HCC) 09/26/2019    Orientation RESPIRATION BLADDER Height & Weight     Self, Situation, Place  Normal Incontinent Weight: 163 lb 5.8 oz (74.1 kg) Height:  5\' 2"  (157.5 cm)  BEHAVIORAL SYMPTOMS/MOOD NEUROLOGICAL BOWEL NUTRITION STATUS      Incontinent Diet(see DC summary)  AMBULATORY STATUS COMMUNICATION OF NEEDS Skin   Extensive Assist Verbally Normal                       Personal Care Assistance Level of Assistance  Bathing, Feeding, Dressing Bathing Assistance: Maximum assistance Feeding assistance: Limited assistance Dressing Assistance: Maximum assistance     Functional Limitations Info  Speech     Speech Info: Impaired(delayed responses, dysarthria)    SPECIAL CARE FACTORS FREQUENCY  PT (By licensed PT), OT (By licensed OT), Speech therapy     PT Frequency: 5x/wk OT Frequency: 5x/wk     Speech Therapy Frequency: 5x/wk      Contractures Contractures Info: Not present     Additional Factors Info  Allergies, Code Status, Insulin Sliding Scale Code Status Info: Full Allergies Info: No [employer]alt[employer] Food Allergies   Insulin Sliding Scale Info: 0-15 units 3x/day with meals       Current Medications (10/07/2019):  This is the current hospital active medication list Current Facility-Administered Medications  Medication Dose Route Frequency Provider Last Rate Last Admin  . 0.9 %  sodium chloride infusion  250 mL Intravenous PRN 12/07/2019, Peter M, MD      . acetaminophen (TYLENOL) tablet 650 mg  650 mg Oral Q4H PRN Bensimhon, [place], MD   650 mg at 10/05/19 1447   Or  . acetaminophen (TYLENOL) suppository 650 mg  650 mg Rectal Q4H PRN Bensimhon, 12/05/19, MD      . amantadine (SYMMETREL) capsule 100 mg  100 mg Oral BID Bevelyn Buckles, MD   100 mg at 10/07/19 0834  . aspirin chewable tablet 81 mg  81 mg Oral Daily Bensimhon, 12/07/19, MD   81 mg at 10/07/19 12/07/19   Or  . aspirin suppository 300 mg  300 mg Rectal Daily Bensimhon, 1017, MD      . atorvastatin (LIPITOR) tablet 80 mg  80 mg Oral q1800 Bensimhon, Bevelyn Buckles, MD   80 mg at 10/06/19 1734  . carvedilol (COREG) tablet 6.25 mg  6.25 mg Oral BID WC Clegg, Amy D, NP   6.25 mg at 10/07/19 0835  . chlorhexidine (PERIDEX) 0.12 % solution 15 mL  15 mL  Mouth Rinse BID Bensimhon, Shaune Pascal, MD   15 mL at 10/07/19 0835  . clopidogrel (PLAVIX) tablet 75 mg  75 mg Oral Q breakfast Bensimhon, Shaune Pascal, MD   75 mg at 10/07/19 0835  . enoxaparin (LOVENOX) injection 40 mg  40 mg Subcutaneous Q24H [place], Peter M, MD   40 mg at 10/07/19 1914  . feeding supplement (BOOST / RESOURCE BREEZE) liquid 1 Container  1 Container Oral BID BM Bensimhon, Shaune Pascal, MD   1 Container at 10/07/19 1344  . insulin aspart (novoLOG) injection 0-15 Units  0-15 Units Subcutaneous TID WC Simmons, Brittainy M, PA-C      . MEDLINE mouth rinse  15 mL Mouth Rinse q12n4p Bensimhon, Shaune Pascal, MD   15 mL at 10/07/19 1344  . metoprolol tartrate  (LOPRESSOR) injection 2.5 mg  2.5 mg Intravenous Q6H PRN Clegg, Amy D, NP   2.5 mg at 10/03/19 0450  . nitroGLYCERIN (NITROSTAT) SL tablet 0.4 mg  0.4 mg Sublingual Q5 min PRN [place], Peter M, MD   0.4 mg at 09/30/19 1216  . ondansetron (ZOFRAN) injection 4 mg  4 mg Intravenous Q6H PRN [place], Peter M, MD      . potassium chloride SA (KLOR-CON) CR tablet 40 mEq  40 mEq Oral BID Ronna Polio, RPH   40 mEq at 10/07/19 0834  . Resource ThickenUp Clear   Oral PRN Bensimhon, Shaune Pascal, MD      . sacubitril-valsartan (ENTRESTO) 24-26 mg per tablet  1 tablet Oral BID Bensimhon, Shaune Pascal, MD   1 tablet at 10/07/19 [phone]  . sodium chloride flush (NS) 0.9 % injection 3 mL  3 mL Intravenous Q12H [place], Peter M, MD   3 mL at 10/07/19 5621     Discharge Medications: Please see discharge summary for a list of discharge medications.  Relevant Imaging Results:  Relevant Lab Results:   Additional Information SS#: [id number]  Geralynn Ochs, LCSW

## 2019-10-07 NOTE — Progress Notes (Signed)
1134 Referral for CRP 2 [place] sent to meet requirement for pt having stent placed. Pt is not appropriate for program at this time due to recent stroke. We will follow PT progress and begin seeing pt when appropriate. Luetta Nutting RN BSN 10/07/2019 11:36 AM

## 2019-10-08 ENCOUNTER — Encounter (HOSPITAL_COMMUNITY)
Admission: AD | Disposition: A | Discharge status: Rehabilitation Facility | Payer: Self-pay | Source: Other Acute Inpatient Hospital | Attending: Internal Medicine

## 2019-10-08 ENCOUNTER — Inpatient Hospital Stay (HOSPITAL_COMMUNITY): Payer: Medicare Other

## 2019-10-08 ENCOUNTER — Inpatient Hospital Stay (HOSPITAL_COMMUNITY): Payer: Medicare Other | Admitting: Anesthesiology

## 2019-10-08 ENCOUNTER — Encounter (HOSPITAL_COMMUNITY): Payer: Self-pay | Admitting: Internal Medicine

## 2019-10-08 DIAGNOSIS — Z955 Presence of coronary angioplasty implant and graft: Secondary | ICD-10-CM

## 2019-10-08 DIAGNOSIS — K264 Chronic or unspecified duodenal ulcer with hemorrhage: Secondary | ICD-10-CM

## 2019-10-08 DIAGNOSIS — K922 Gastrointestinal hemorrhage, unspecified: Secondary | ICD-10-CM | POA: Diagnosis present

## 2019-10-08 DIAGNOSIS — D62 Acute posthemorrhagic anemia: Secondary | ICD-10-CM | POA: Diagnosis present

## 2019-10-08 HISTORY — PX: ESOPHAGOGASTRODUODENOSCOPY (EGD) WITH PROPOFOL: SHX5813

## 2019-10-08 HISTORY — PX: HEMOSTASIS CONTROL: SHX6838

## 2019-10-08 HISTORY — PX: HEMOSTASIS CLIP PLACEMENT: SHX6857

## 2019-10-08 LAB — BASIC METABOLIC PANEL
Anion gap: 12 (ref 5–15)
BUN: 49 mg/dL — ABNORMAL HIGH (ref 8–23)
CO2: 22 mmol/L (ref 22–32)
Calcium: 8.9 mg/dL (ref 8.9–10.3)
Chloride: 110 mmol/L (ref 98–111)
Creatinine, Ser: 1.06 mg/dL — ABNORMAL HIGH (ref 0.44–1.00)
GFR calc Af Amer: >60 mL/min (ref >60)
GFR calc non Af Amer: 54 mL/min — ABNORMAL LOW (ref >60)
Glucose, Bld: 178 mg/dL — ABNORMAL HIGH (ref 70–99)
Potassium: 4.3 mmol/L (ref 3.5–5.1)
Sodium: 144 mmol/L (ref 135–145)

## 2019-10-08 LAB — CBC
HCT: 30.2 % — ABNORMAL LOW (ref 36.0–46.0)
HCT: 27.4 % — ABNORMAL LOW (ref 36.0–46.0)
Hemoglobin: 9.5 g/dL — ABNORMAL LOW (ref 12.0–15.0)
Hemoglobin: 8.7 g/dL — ABNORMAL LOW (ref 12.0–15.0)
MCH: 31.8 pg (ref 26.0–34.0)
MCH: 31.9 pg (ref 26.0–34.0)
MCHC: 31.5 g/dL (ref 30.0–36.0)
MCHC: 31.8 g/dL (ref 30.0–36.0)
MCV: 101 fL — ABNORMAL HIGH (ref 80.0–100.0)
MCV: 100.4 fL — ABNORMAL HIGH (ref 80.0–100.0)
Platelets: 380 10*3/uL (ref 150–400)
Platelets: 349 10*3/uL (ref 150–400)
RBC: 2.99 MIL/uL — ABNORMAL LOW (ref 3.87–5.11)
RBC: 2.73 MIL/uL — ABNORMAL LOW (ref 3.87–5.11)
RDW: 13.9 % (ref 11.5–15.5)
RDW: 14 % (ref 11.5–15.5)
WBC: 16.1 10*3/uL — ABNORMAL HIGH (ref 4.0–10.5)
WBC: 17.7 10*3/uL — ABNORMAL HIGH (ref 4.0–10.5)
nRBC: 0.3 % — ABNORMAL HIGH (ref 0.0–0.2)
nRBC: 0.3 % — ABNORMAL HIGH (ref 0.0–0.2)

## 2019-10-08 LAB — OCCULT BLOOD X 1 CARD TO LAB, STOOL: Fecal Occult Bld: POSITIVE — AB

## 2019-10-08 LAB — ABO/RH: ABO/RH(D): A POS

## 2019-10-08 LAB — MAGNESIUM: Magnesium: 1.4 mg/dL — ABNORMAL LOW (ref 1.7–2.4)

## 2019-10-08 LAB — GLUCOSE, CAPILLARY
Glucose-Capillary: 150 mg/dL — ABNORMAL HIGH (ref 70–99)
Glucose-Capillary: 172 mg/dL — ABNORMAL HIGH (ref 70–99)
Glucose-Capillary: 218 mg/dL — ABNORMAL HIGH (ref 70–99)
Glucose-Capillary: 175 mg/dL — ABNORMAL HIGH (ref 70–99)
Glucose-Capillary: 153 mg/dL — ABNORMAL HIGH (ref 70–99)

## 2019-10-08 LAB — PREPARE RBC (CROSSMATCH)

## 2019-10-08 SURGERY — ESOPHAGOGASTRODUODENOSCOPY (EGD) WITH PROPOFOL
Anesthesia: General

## 2019-10-08 MED ORDER — SODIUM CHLORIDE 0.9 % IV SOLN: Freq: Once | INTRAVENOUS | Status: AC

## 2019-10-08 MED ORDER — PROPOFOL 500 MG/50ML IV EMUL
INTRAVENOUS | Status: DC | PRN
  Administered 2019-10-08: 50 ug/kg/min via INTRAVENOUS

## 2019-10-08 MED ORDER — PHENYLEPHRINE 40 MCG/ML (10ML) SYRINGE FOR IV PUSH (FOR BLOOD PRESSURE SUPPORT)
PREFILLED_SYRINGE | INTRAVENOUS | Status: DC | PRN
  Administered 2019-10-08 (×2): 120 ug via INTRAVENOUS
  Administered 2019-10-08 (×2): 80 ug via INTRAVENOUS

## 2019-10-08 MED ORDER — PANTOPRAZOLE SODIUM 40 MG IV SOLR
40.0000 mg | Freq: Two times a day (BID) | INTRAVENOUS | Status: DC
  Administered 2019-10-08: 40 mg via INTRAVENOUS
  Filled 2019-10-08: qty 40

## 2019-10-08 MED ORDER — PROPOFOL 10 MG/ML IV BOLUS
INTRAVENOUS | Status: DC | PRN
  Administered 2019-10-08: 70 mg via INTRAVENOUS

## 2019-10-08 MED ORDER — SODIUM CHLORIDE 0.9 % IV BOLUS
500.0000 mL | Freq: Once | INTRAVENOUS | Status: AC
  Administered 2019-10-08: 500 mL via INTRAVENOUS

## 2019-10-08 MED ORDER — MAGNESIUM SULFATE 4 GM/100ML IV SOLN
4.0000 g | Freq: Once | INTRAVENOUS | Status: AC
  Administered 2019-10-08: 4 g via INTRAVENOUS
  Filled 2019-10-08: qty 100

## 2019-10-08 MED ORDER — ALBUMIN HUMAN 5 % IV SOLN: INTRAVENOUS | Status: DC | PRN

## 2019-10-08 MED ORDER — SUCCINYLCHOLINE CHLORIDE 200 MG/10ML IV SOSY
PREFILLED_SYRINGE | INTRAVENOUS | Status: DC | PRN
  Administered 2019-10-08: 120 mg via INTRAVENOUS

## 2019-10-08 MED ORDER — PHENYLEPHRINE HCL-NACL 10-0.9 MG/250ML-% IV SOLN
INTRAVENOUS | Status: DC | PRN
  Administered 2019-10-08: 40 ug/min via INTRAVENOUS

## 2019-10-08 MED ORDER — SODIUM CHLORIDE 0.9 % IV SOLN
8.0000 mg/h | INTRAVENOUS | Status: DC
  Administered 2019-10-08 – 2019-10-11 (×8): 8 mg/h via INTRAVENOUS
  Filled 2019-10-08 (×8): qty 80

## 2019-10-08 MED ORDER — SODIUM CHLORIDE 0.9% IV SOLUTION: Freq: Once | INTRAVENOUS | Status: DC

## 2019-10-08 MED ORDER — SODIUM CHLORIDE (PF) 0.9 % IJ SOLN
PREFILLED_SYRINGE | INTRAMUSCULAR | Status: DC | PRN
  Administered 2019-10-08: 6 mL

## 2019-10-08 MED ORDER — DEXAMETHASONE SODIUM PHOSPHATE 4 MG/ML IJ SOLN
INTRAMUSCULAR | Status: DC | PRN
  Administered 2019-10-08: 4 mg via INTRAVENOUS

## 2019-10-08 MED ORDER — LACTATED RINGERS IV SOLN: INTRAVENOUS | Status: DC | PRN

## 2019-10-08 MED ORDER — FENTANYL CITRATE (PF) 100 MCG/2ML IJ SOLN
INTRAMUSCULAR | Status: DC | PRN
  Administered 2019-10-08: 50 ug via INTRAVENOUS

## 2019-10-08 MED ORDER — ONDANSETRON HCL 4 MG/2ML IJ SOLN
INTRAMUSCULAR | Status: DC | PRN
  Administered 2019-10-08: 4 mg via INTRAVENOUS

## 2019-10-08 MED ORDER — EPINEPHRINE 1 MG/10ML IJ SOSY
PREFILLED_SYRINGE | INTRAMUSCULAR | Status: AC
  Filled 2019-10-08: qty 10

## 2019-10-08 MED ORDER — LIDOCAINE 2% (20 MG/ML) 5 ML SYRINGE
INTRAMUSCULAR | Status: DC | PRN
  Administered 2019-10-08: 40 mg via INTRAVENOUS

## 2019-10-08 SURGICAL SUPPLY — 15 items

## 2019-10-08 NOTE — Progress Notes (Signed)
Nutrition Follow-up  DOCUMENTATION CODES:   Non-severe (moderate) malnutrition in context of acute illness/injury  INTERVENTION:  When pt's diet is advanced: Vital Cuisine shake PO TID, each supplement provides 500 kcal and 22 grams of protein    NUTRITION DIAGNOSIS:   Moderate Malnutrition related to acute illness as evidenced by mild muscle depletion, mild fat depletion.  Ongoing.  GOAL:   Patient will meet greater than or equal to 90% of their needs  Progressing.  MONITOR:   Diet advancement, TF tolerance, Labs, Weight trends  REASON FOR ASSESSMENT:   Consult Enteral/tube feeding initiation and management  ASSESSMENT:   69 yo female admitted with NSTEMI, acute CHF and later had a change in mental status and found to have acute stroke. PMH includes PUD, seasonal allergies, cholecystectomy and left rotator cuff repair  3/26 Cardiac Cath, 3v CAD, EF 20% (new onset acute CHF) 3/28 Rapid response due to lethargy, slurred speech, CT head no acute findings 3/29 CVA: R basal infarct, small r pontine infarct 3/30 CT acute infact centered in r. Parietal lobe  4/3 MBS, Dysphagia 1, Nectar thick liquids 4/6 Rapid Response due to hypotension and dark tarry liquid stools  Per MD, pt C/O abdominal pain, pt with GI bleed; GI following. Pt to have EGD today.   Discussed pt with her son, who is an Charity fundraiser in [place]. Pt's son has brought in his mother's favorite [place] yogurt for her to consume in addition to some Naked juices (which she typically enjoys) with added protein. These juices are being thickened appropriately. Yesterday, pt consumed nearly 1/2 of a Naked Juice. Discussed methods for increasing pt's protein/calorie intake with her son. Pt's son is agreeable to pt receiving Vital Cuisine shakes (pre-thickened to nectar consistency).  PO Intake: 5-65% x4 recorded meals (36% average intake)  Admission wt: 74.7 kg Current wt: 74.1 kg  Medications reviewed and include: Novolog,  IV Magnesium sulfate  Labs reviewed: Mg 1.4 (L), CBGs 150-202   Diet Order:   Diet Order            Diet NPO time specified  Diet effective now              EDUCATION NEEDS:   Not appropriate for education at this time  Skin:  Skin Assessment: Reviewed RN Assessment  Last BM:  10/07/19  Height:   Ht Readings from Last 1 Encounters:  09/27/19 5\' 2"  (1.575 m)    Weight:   Wt Readings from Last 1 Encounters:  10/07/19 74.1 kg    BMI:  Body mass index is 29.88 kg/m.  Estimated Nutritional Needs:   Kcal:  1500-1700 kcals  Protein:  75-85 g  Fluid:  >/= 1.5 L    12/07/19, MS, RD, LDN RD pager number and weekend/on-call pager number located in [place].

## 2019-10-08 NOTE — Plan of Care (Signed)
  Problem: Clinical Measurements: Goal: Diagnostic test results will improve Outcome: Progressing   Problem: Elimination: Goal: Will not experience complications related to urinary retention Outcome: Progressing   Problem: Pain Managment: Goal: General experience of comfort will improve Outcome: Progressing   

## 2019-10-08 NOTE — Anesthesia Preprocedure Evaluation (Addendum)
Anesthesia Evaluation  Patient identified by MRN, date of birth, ID band Patient awake    Reviewed: Allergy & Precautions, NPO status , Patient's Chart, lab work & pertinent test results  Airway Mallampati: II  TM Distance: >3 FB Neck ROM: Full    Dental  (+) Edentulous Upper, Dental Advisory Given   Pulmonary neg pulmonary ROS,    Pulmonary exam normal        Cardiovascular + CAD, + Past MI, + Cardiac Stents and +CHF   Rhythm:Regular Rate:Normal     Neuro/Psych CVA negative psych ROS   GI/Hepatic Neg liver ROS, PUD,   Endo/Other  negative endocrine ROS  Renal/GU Renal InsufficiencyRenal disease     Musculoskeletal negative musculoskeletal ROS (+)   Abdominal Normal abdominal exam  (+)   Peds  Hematology negative hematology ROS (+)   Anesthesia Other Findings   Reproductive/Obstetrics                            Echo:  1. Left ventricular ejection fraction, by estimation, is 50 to 55%. The  left ventricle has low normal function. The left ventricle demonstrates  regional wall motion abnormalities (see scoring diagram/findings for  description). There is mild concentric  left ventricular hypertrophy. Left ventricular diastolic parameters are  indeterminate. Elevated left ventricular end-diastolic pressure. There is  mild hypokinesis of the left ventricular, entire anteroseptal wall. There  is mild hypokinesis of the left  ventricular, mid-apical anterior wall.  2. Right ventricular systolic function is normal. The right ventricular  size is normal. Tricuspid regurgitation signal is inadequate for assessing  PA pressure.  3. The mitral valve is normal in structure. Trivial mitral valve  regurgitation. No evidence of mitral stenosis.  4. The aortic valve is tricuspid. Aortic valve regurgitation is not  visualized. No aortic stenosis is present.   Anesthesia Physical Anesthesia  Plan  ASA: III  Anesthesia Plan: General   Post-op Pain Management:    Induction: Intravenous, Rapid sequence and Cricoid pressure planned  PONV Risk Score and Plan: Propofol infusion  Airway Management Planned: Oral ETT  Additional Equipment: None  Intra-op Plan:   Post-operative Plan: Extubation in OR  Informed Consent: I have reviewed the patients History and Physical, chart, labs and discussed the procedure including the risks, benefits and alternatives for the proposed anesthesia with the patient or authorized representative who has indicated his/her understanding and acceptance.       Plan Discussed with: CRNA  Anesthesia Plan Comments:       Anesthesia Quick Evaluation

## 2019-10-08 NOTE — Op Note (Signed)
[facility] Patient Name: Brittney Kennedy Procedure Date : 10/08/2019 MRN: [id number] Attending MD: Willaim Rayas. Adela Lank , MD Date of Birth: 1951-04-06 CSN: [id number] Age: 69 Admit Type: Inpatient Procedure:                Upper GI endoscopy Indications:              Active gastrointestinal bleeding - history of CAD                            s/p NSTEMI with coronary stenting and recent CVA,                            on Plavix and aspirin (cannot stop Plavix), active                            upper GI Bleeding today. Patient electively                            intubated for this case per anesthesia, Providers:                Willaim Rayas. Adela Lank, MD, [place] RN, RN,                            Harrington Challenger, Technician, Romie Minus, CRNA Referring MD:              Medicines:                Monitored Anesthesia Care Complications:            No immediate complications. Estimated blood loss:                            Minimal. Estimated Blood Loss:     Estimated blood loss was minimal. Procedure:                Pre-Anesthesia Assessment:                           - Prior to the procedure, a History and Physical                            was performed, and patient medications and                            allergies were reviewed. The patient's tolerance of                            previous anesthesia was also reviewed. The risks                            and benefits of the procedure and the sedation                            options and risks were discussed with the patient.  All questions were answered, and informed consent                            was obtained. Prior Anticoagulants: The patient has                            taken Plavix (clopidogrel), last dose was day of                            procedure. ASA Grade Assessment: III - A patient                            with severe systemic disease. After reviewing the                       risks and benefits, the patient was deemed in                            satisfactory condition to undergo the procedure.                           After obtaining informed consent, the endoscope was                            passed under direct vision. Throughout the                            procedure, the patient's blood pressure, pulse, and                            oxygen saturations were monitored continuously. The                            GIF-H190 ([id number] Olympus gastroscope was                            introduced through the mouth, and advanced to the                            second part of duodenum. The upper GI endoscopy was                            accomplished without difficulty. The patient                            tolerated the procedure well. Scope In: Scope Out: Findings:      Esophagogastric landmarks were identified: the Z-line was found at 40       cm, the gastroesophageal junction was found at 40 cm and the upper       extent of the gastric folds was found at 40 cm from the incisors.      The exam of the esophagus was otherwise normal.      Red and clotted blood was found in the gastric fundus and in the gastric  body. Most of this was able to be suctioned and removed with exception       of large clot in the fundus. Nothing actively bleeding in the stomach,       given pathology in the duodenal bulb most of the time of this exam was       spent treating the ulcer.      The exam of the stomach was otherwise normal, no obvious pathology to       cause bleeding, but proximal stomach views were limited in some areas       due to clot.      One oozing duodenal ulcer with a visible vessel was found in the       duodenal bulb. The lesion was roughly 8 mm in largest dimension. Area       was successfully injected with 6 mL of a 1:10,000 solution of       epinephrine for drug delivery to help slow the bleeding and improve        visualization. To stop active bleeding, five hemostatic clips were       successfully placed across the ulcer bed / vessel. The lesion was       somewhat fibrotic and difficult to approximate the edges of the ulcer, 4       clips were in good place across the vessel and mid portion of the ulcer       bed, while one clip did not attach well and was on the periphery of the       lesion. Following endoscopic therapy the lesion was observed for several       minutes. There was no bleeding at the end of the procedure.      Red blood was found in the second portion of the duodenum and lavaged.      The exam of the duodenum was otherwise normal. Impression:               - Esophagogastric landmarks identified.                           - Normal esophagus otherwise                           - Red and clotted blood in the gastric fundus and                            in the gastric body but no obvious pathology in the                            stomach causing bleeding.                           - Oozing duodenal ulcer with a visible vessel.                            Injected with epinephrine. 5 clips were placed with                            eventual hemostasis  Lesion is high risk for rebleeding in the setting                            of ongoing Plavix use, would keep NPO tonigh and                            monitor closely for rebleeding. Recommendation:           - Return patient to hospital ward for ongoing care.                           - NPO.                           - Continue present medications.                           - IV protonix drip                           - Serial Hgb, transfuse as needed                           - Monitor for recurrent bleeding, please contact us                            if this occurs, would recommend IR consult                            embolization in the setting of recurrent bleeding Procedure Code(s):        ---  Professional ---                           [id number], Esophagogastroduodenoscopy, flexible,                            transoral; with control of bleeding, any method                           43236, 59, Esophagogastroduodenoscopy, flexible,                            transoral; with directed submucosal injection(s),                            any substance Diagnosis Code(s):        --- Professional ---                           K92.2, Gastrointestinal hemorrhage, unspecified                           K26.4, Chronic or unspecified duodenal ulcer with                            hemorrhage CPT copyright 2019 American Medical Association. All rights reserved. The codes documented  in this report are preliminary and upon coder review may  be revised to meet current compliance requirements. Viviann Spare P. Adela Lank, MD 10/08/2019 5:26:47 PM This report has been signed electronically. Number of Addenda: 0

## 2019-10-08 NOTE — Significant Event (Signed)
Rapid Response Event Note  Overview: Hypotension and dark tarry liquid stools  Initial Focused Assessment: Notified by nursing staff related to Ms. Que's low BP 76/60. Pt has had some stools overnight but recently had 2 melenic liquid stools. Ms. Caban is pale, warm and dry. Abdomen is soft, nontender. Dr. Deforest Hoyles notified by the nursing staff and came to bedside. Orders previously received for labs, IVF bolus. Marcy Salvo RN to inform pt's son. POC discussed with primary nurse Arvella Nigh.  0415- HR 91 SR, 105/60, RR 18 with sats 100% on RA  Interventions: -NS bolus 500 cc -STAT CBC (9.5g/dl)  Plan of Care: -Recheck VS q17mins x1 hour, q 30 x2 then q1 while having frequent stools - Notify primary svc for any further liquid stools or SBP less than 90 mmHg.  -Notify primary svc and/or RRRN if any further assistance needed.   Event Summary: Call received [phone] Arrived 0340 Call ended [phone]  Rose Fillers

## 2019-10-08 NOTE — Progress Notes (Addendum)
Advanced Heart Failure Rounding Note   Subjective:    Events  - cath 09/27/19 3v CAD. EF 20% - right groin hematoma. U/s 3/27 negative for PSA - 3/28 altered mental status due to pain meds. Head CT, ABG and ammonia normal - 09/30/19 CVA, ACS with DES LAD and mid CX - 3/30 Headache with stat CT. EEG- no seizure, cortical dysfunction in the right hemisphere likely secondary to underlying infarct as well as moderate to severe diffuse encephalopathy, nonspecific to etiology. - 3/30 Echo EF 50-55%  Passed swallow study and now on D1 diet.   Earlier this morning had hypotension and  2 tarry stools. Given 500 cc NS. Hgb has gone down from 11>9.5>8.7.   Rapid response called and cardiology evaluated.  Entresto and carvedilol held.   Complaining of abdominal pain. Pointing to mid epigastric area. Denies chest pain.     Objective:   Weight Range:  Vital Signs:   Temp:  [97.6 F (36.4 C)-98.9 F (37.2 C)] 98.5 F (36.9 C) (04/06 0545) Pulse Rate:  [90-100] 100 (04/06 0627) Resp:  [16-22] 18 (04/06 0545) BP: (75-158)/(50-87) 131/73 (04/06 0627) SpO2:  [97 %-100 %] 100 % (04/06 0326) Last BM Date: 10/07/19  Weight change: Filed Weights   10/05/19 0500 10/06/19 0528 10/07/19 0431  Weight: 75.3 kg 72.6 kg 74.1 kg    Intake/Output:   Intake/Output Summary (Last 24 hours) at 10/08/2019 0719 Last data filed at 10/08/2019 0504 Gross per 24 hour  Intake 500 ml  Output --  Net 500 ml    PHYSICAL EXAM: General:  Pale.  No resp difficulty HEENT: Left facial droop.  Neck: supple. no JVD. Carotids 2+ bilat; no bruits. No lymphadenopathy or thryomegaly appreciated. Cor: PMI nondisplaced. Regular rate & rhythm. No rubs, gallops or murmurs. Lungs: clear Abdomen: soft, mid epigastric area tender, nondistended. No hepatosplenomegaly. No bruits or masses. Good bowel sounds. Extremities: no cyanosis, clubbing, rash, edema. R groin ecchymotic.  LUE flacid. LLE weak.  Neuro: Aphasic. LUE  flaccid. LLE weak.    Telemetry: ST 100s   Labs: Basic Metabolic Panel: Recent Labs  Lab 10/02/19 0604 10/02/19 0604 10/03/19 0258 10/03/19 0258 10/04/19 0400 10/04/19 0400 10/05/19 0350 10/05/19 0350 10/06/19 0439 10/07/19 0446 10/08/19 0319  NA 143   < > 147*   < > 150*  --  153*  --  148* 145 144  K 4.2   < > 4.5   < > 4.3  --  3.8  --  3.5 3.3* 4.3  CL 111   < > 115*   < > 115*  --  116*  --  113* 110 110  CO2 15*   < > 19*   < > 21*  --  23  --  24 23 22   GLUCOSE 80   < > 105*   < > 109*  --  128*  --  123* 115* 178*  BUN 17   < > 19   < > 18  --  17  --  14 13 49*  CREATININE 1.47*   < > 1.26*   < > 1.17*  --  1.14*  --  1.01* 0.90 1.06*  CALCIUM 8.8*   < > 9.2   < > 9.4   < > 9.4   < > 9.3 8.8* 8.9  MG 1.8   < > 1.8  --  1.8  --  1.8  --  1.7  --  1.4*  PHOS 3.9  --  3.6  --  3.9  --  3.6  --  2.2*  --   --    < > = values in this interval not displayed.    Liver Function Tests: No results for input(s): AST, ALT, ALKPHOS, BILITOT, PROT, ALBUMIN in the last 168 hours. No results for input(s): LIPASE, AMYLASE in the last 168 hours. No results for input(s): AMMONIA in the last 168 hours.  CBC: Recent Labs  Lab 10/05/19 0350 10/06/19 0439 10/07/19 0446 10/08/19 0352 10/08/19 0648  WBC 10.3 10.7* 9.5 16.1* 17.7*  HGB 10.7* 10.9* 11.1* 9.5* 8.7*  HCT 34.2* 35.4* 35.8* 30.2* 27.4*  MCV 101.2* 100.9* 100.3* 101.0* 100.4*  PLT 325 357 362 380 349    Cardiac Enzymes: No results for input(s): CKTOTAL, CKMB, CKMBINDEX, TROPONINI in the last 168 hours.  BNP: BNP (last 3 results) No results for input(s): BNP in the last 8760 hours.  ProBNP (last 3 results) No results for input(s): PROBNP in the last 8760 hours.    Other results:  Imaging: VAS [place] ABI WITH/WO TBI  Result Date: 10/07/2019 LOWER EXTREMITY DOPPLER STUDY Indications: Blue toes on left. High Risk Factors: Coronary artery disease.  Comparison Study: No prior study Performing Technologist:  Gertie Fey MHA, RVT, RDCS, RDMS  Examination Guidelines: A complete evaluation includes at minimum, Doppler waveform signals and systolic blood pressure reading at the level of bilateral brachial, anterior tibial, and posterior tibial arteries, when vessel segments are accessible. Bilateral testing is considered an integral part of a complete examination. Photoelectric Plethysmograph (PPG) waveforms and toe systolic pressure readings are included as required and additional duplex testing as needed. Limited examinations for reoccurring indications may be performed as noted.  ABI Findings: +---------+------------------+-----+----------+--------+ Right    Rt Pressure (mmHg)IndexWaveform  Comment  +---------+------------------+-----+----------+--------+ Brachial 143                    monophasic         +---------+------------------+-----+----------+--------+ PTA      151               0.97 triphasic          +---------+------------------+-----+----------+--------+ DP       133               0.86 triphasic          +---------+------------------+-----+----------+--------+ Great Toe138               0.89                    +---------+------------------+-----+----------+--------+ +---------+------------------+-----+----------+-------+ Left     Lt Pressure (mmHg)IndexWaveform  Comment +---------+------------------+-----+----------+-------+ Brachial 155                    triphasic         +---------+------------------+-----+----------+-------+ PTA      119               0.77 monophasic        +---------+------------------+-----+----------+-------+ DP       127               0.82 monophasic        +---------+------------------+-----+----------+-------+ Great Toe69                0.45                   +---------+------------------+-----+----------+-------+ +-------+-----------+-----------+------------+------------+ ABI/TBIToday's ABIToday's TBIPrevious  ABIPrevious TBI +-------+-----------+-----------+------------+------------+ Right  0.97  0.89                                +-------+-----------+-----------+------------+------------+ Left   0.82       0.45                                +-------+-----------+-----------+------------+------------+  Summary: Right: Resting right ankle-brachial index is within normal range. No evidence of significant right lower extremity arterial disease. The right toe-brachial index is normal. Left: Resting left ankle-brachial index indicates mild left lower extremity arterial disease. The left toe-brachial index is abnormal.  *See table(s) above for measurements and observations.  Electronically signed by Monica Martinez MD on 10/07/2019 at 5:32:13 PM.    Final      Medications:     Scheduled Medications: . amantadine  100 mg Oral BID  . aspirin  81 mg Oral Daily   Or  . aspirin  300 mg Rectal Daily  . atorvastatin  80 mg Oral q1800  . chlorhexidine  15 mL Mouth Rinse BID  . clopidogrel  75 mg Oral Q breakfast  . enoxaparin (LOVENOX) injection  40 mg Subcutaneous Q24H  . feeding supplement  1 Container Oral BID BM  . insulin aspart  0-15 Units Subcutaneous TID WC  . mouth rinse  15 mL Mouth Rinse q12n4p  . pantoprazole (PROTONIX) IV  40 mg Intravenous Q12H  . sodium chloride flush  3 mL Intravenous Q12H    Infusions: . sodium chloride      PRN Medications: sodium chloride, acetaminophen **OR** acetaminophen, metoprolol tartrate, nitroGLYCERIN, ondansetron (ZOFRAN) IV, Resource ThickenUp Clear   Assessment/Plan:   1. CAD with NSTEMI - cath with severe diffuse 3v CAD particularly on left side with severe LV dysfunction. Ideally will need CABG, however in looking at her films worry about the quality of her LAD target and also not sure she would benefit much from a graft to the RCA. Concerned about her mobility especially after recent fall an groin hematoma and low albumin - s/p PCI  of pLAD w DES x 1 + PCI of mLCX w/ DES x 1 - CMRI- LVEF 29% RV 37% Global hypokinesis.  - 2D echo 10/02/19 showed improved EF, up to 50-55% - No chest pain. - continue Plavix and statin. Hold aspirin with GI bleed. Discuss interventional team.  Now able to take po so getting Plavix crushed in applesauce  2. Acute systolic HF due to NICM - Initial EF ~25% - repeat echo 3/31, EF normalized, 50-55% - Hold arni and bb for now with GI bleed.   3. R groin hematoma after fall - has hematoma at cath site.  - u/s negative for PSA - CT + hematoma. No mention of fracture but not completely imaged. - Hgb has been trending down 11>9.5>8.7  - Hold lovenox   4. Hypokalemia/Hypomag - K 4.3 - Mag 1.4  Give 4 grams Mag now.   5. Severe protein calorie malnutrition  - albumin 2.3  6.  Physical deconditioning - PT/OT/Speech following   7 . Stroke, Acute  -CT 3/28 No acute findings  -CT 3/29 at 0500 R basal infact, small r pontine infact.  -CT 3/30 - Acute infarct centered in the right parietal lobe correlating with perfusion defects yesterday.2. Age-indeterminate infarct at the right caudate that is stable from yesterday. - Improving. Neuro appreciated.  - PT/OT/Speech and CIR  following.  - Hopefully can transfer to CIR soon. - Now on D1 diet  8. Hypernatremia - Resolved.   9. NSVT -K 4.3  - Mag 1.4 - Give 4 grams Mag now.  - Hold bb for now with bleed.   10. Anemia, GI bleed.  H/O gastric ulcers years ago per son.  +FOBT  -Hgb down 11>9.5>8.7 - Give IV Protonix now.  - Hold aspirin - Consult GI.   I personally called her son to provide an update. GI consulted. Dr [name] plans to talk to the interventional team regarding GI bleed and recent stents. For now hold asa and give protonix.   BP stable for now. Given another 500 cc bolus now. Watch closely. May need to move to ICU if SBP < 90.   Length of Stay: 11   Amy Clegg NP-C  10/08/2019, 7:19 AM  Advanced Heart Failure  Team Pager [phone] (M-F; 7a - 4p)  Please contact [facility] for night-coverage after hours (4p -7a ) and weekends on [email or web address]  Patient seen and examined with the above-signed Advanced Practice Provider and/or Housestaff. I personally reviewed laboratory data, imaging studies and relevant notes. I independently examined the patient and formulated the important aspects of the plan. I have edited the note to reflect any of my changes or salient points. I have personally discussed the plan with the patient and/or family.  Developed acute UGI bleed last night with melena and drop in hgb. SBPs dropped into 70s. Responded to NS bolus. I started IV protonix. ASA and anti-HTN meds held. Lovenox held. Plavix needs to continue  Now c/o belly pain. No further melena this am.   General:  Pale weak appearing HEENT: normal left facial droop Neck: supple. no JVD. Carotids 2+ bilat; no bruits. No lymphadenopathy or thryomegaly appreciated. Cor: PMI nondisplaced. Regular rate & rhythm. No rubs, gallops or murmurs. Lungs: clear Abdomen: soft, tender in epigastrum nondistended. No hepatosplenomegaly. No bruits or masses. Good bowel sounds. Extremities: no cyanosis, clubbing, rash, edema Neuro: alert & orientedx3, left facial droop. Left side weak/plegic moves all 4 extremities w/o difficulty. Affect pleasant  She has acute UGI bleed eith significant drop in hgb. Has h/o gastric ulcers. BP now stable. Have consulted GI. Will give 1u RBCs. On IV protonix. ASA held. Need to continue plavix for recent stent.  Given majority of issues are now non-cardiac will ask [facility] to assume care and we will stay on to manage cardiac issues.   Arvilla Meres, MD  8:33 AM

## 2019-10-08 NOTE — Progress Notes (Signed)
Inpatient Rehabilitation-Admissions Coordinator ]  Met with pt and son bedside. Son reports the patient did not have a good night. Noted rapid response this AM as well as plan for EGD. Will continue to follow up with pt and son for more concrete DC plans as that will help determine if pt will need CIR vs SNF.   Raechel Ache, OTR/L  Rehab Admissions Coordinator  [phone] 10/08/2019 2:50 PM

## 2019-10-08 NOTE — Progress Notes (Signed)
Pt had 2 loose stools blackish in color , paged MD and gave order for stool occult, sample was collected, awaiting results. Checked Pt's BP 70/50,  Attempted both arms with allow BP, paged MD who gave new orders.  Will continue to monitor and update MD. Pt is lethargic and sleepy at the moment.

## 2019-10-08 NOTE — Progress Notes (Signed)
PT Cancellation Note  Patient Details Name: Brittney Kennedy MRN: [id number] DOB: [birth year]   Cancelled Treatment:    Reason Eval/Treat Not Completed: Medical issues which prohibited therapy.  Pt is anemic, lethargic and presently getting blood. Pt's son asks to hold today. 10/08/2019  Jacinto Halim., PT Acute Rehabilitation Services [phone]  (pager) [phone]  (office)   Brittney Kennedy 10/08/2019, 2:52 PM

## 2019-10-08 NOTE — Progress Notes (Signed)
SLP Cancellation Note  Patient Details Name: Brittney Kennedy MRN: [id number] DOB: 15-Dec-1950   Cancelled treatment: Pt preparing for EGD. Spoke with son at bedside.  SLP will continue to follow for speech/swallowing.  Jrake Rodriquez L. Samson Frederic, MA CCC/SLP Acute Rehabilitation Services Office number [phone] Pager [phone]         [name] 10/08/2019, 1:52 PM

## 2019-10-08 NOTE — Transfer of Care (Signed)
Immediate Anesthesia Transfer of Care Note  Patient: Brittney Kennedy  Procedure(s) Performed: ESOPHAGOGASTRODUODENOSCOPY (EGD) WITH PROPOFOL (N/A ) HEMOSTASIS CONTROL  Patient Location: PACU  Anesthesia Type:General  Level of Consciousness: drowsy and patient cooperative  Airway & Oxygen Therapy: Patient Spontanous Breathing and Patient connected to face mask oxygen  Post-op Assessment: Report given to RN and Post -op Vital signs reviewed and stable  Post vital signs: Reviewed and stable  Last Vitals:  Vitals Value Taken Time  BP 156/63 10/08/19 1723  Temp    Pulse 87 10/08/19 1726  Resp 28 10/08/19 1726  SpO2 100 % 10/08/19 1726  Vitals shown include unvalidated device data.  Last Pain:  Vitals:   10/08/19 1558  TempSrc: Temporal  PainSc:       Patients Stated Pain Goal: 0 (09/30/19 1500)  Complications: No apparent anesthesia complications

## 2019-10-08 NOTE — Anesthesia Procedure Notes (Signed)
Procedure Name: Intubation Date/Time: 10/08/2019 4:29 PM Performed by: Lovie Chol, CRNA Pre-anesthesia Checklist: Patient identified, Emergency Drugs available, Suction available and Patient being monitored Patient Re-evaluated:Patient Re-evaluated prior to induction Oxygen Delivery Method: Circle System Utilized Preoxygenation: Pre-oxygenation with 100% oxygen Induction Type: IV induction, Cricoid Pressure applied and Rapid sequence Laryngoscope Size: Miller and 2 Grade View: Grade I Tube type: Oral Tube size: 7.0 mm Number of attempts: 1 Airway Equipment and Method: Stylet and Oral airway Placement Confirmation: ETT inserted through vocal cords under direct vision,  positive ETCO2 and breath sounds checked- equal and bilateral Secured at: 21 cm Tube secured with: Tape Dental Injury: Teeth and Oropharynx as per pre-operative assessment

## 2019-10-08 NOTE — Consult Note (Signed)
Medical Consultation   Brittney Kennedy  ESL:[id number]  DOB: [birth year]  DOA: 09/27/2019  PCP: Patient, No Pcp Per   Outpatient Specialists: None   Requesting physician: [place]  Reason for consultation: Transferred here for ?CABG but not a candidate.  R groin hematoma followed by large MCA stroke.  Son is a Marine scientist.  Moved to ICU and then had STEMI, stented x 2, EF normalized.  Transferred to 3W, on Plavix, ASA.  GI bleed last night, Hgb down 3 units.  Started Plavix, continued ASA.  Major issues are stroke and GI bleeding.  Cards can continue to follow.  Needs TRH to assume care.   GI and neuro requested to see patient this AM.  History of Present Illness: Brittney Kennedy is an 69 y.o. female HFrEF and CAD who was admitted from 3/25-26 at [facility] for  NSTEMI with cath revealing severe multivessel CAD, EF 20%.  She was transferred to [facility] for consideration of CABG.   She was able to report feeling weak at the time of my evaluation but generally had little to say.  She appeared weak, tired, and had limited L-sided mobility.    Review of Systems:  ROS As per HPI otherwise 10 point review of systems negative.  Limited by lethargy.   Past Medical History: Past Medical History:  Diagnosis Date  . CAD (coronary artery disease)    a. 09/2019 NSTEMI/Cath: LM nl, LAD 90p, 58m diffuse mid-dist dzs, LCX 914mV groove/OM2, RI 90p, RCA dominant, nl, RPDA 9068mF 25%, glob HK.  . HMarland KitchenrEF (heart failure with reduced ejection fraction) ([place]  a. 09/2019 LV gram: EF 25%, global HK.  . PMarland KitchenD (peptic ulcer disease)   . Seasonal allergies     Past Surgical History: Past Surgical History:  Procedure Laterality Date  . CHOLECYSTECTOMY    . CORONARY STENT INTERVENTION N/A 09/30/2019   Procedure: CORONARY STENT INTERVENTION;  Surgeon: JorMartiniqueeter M, MD;  Location: [facility];  Service: Cardiovascular;  Laterality: N/A;  . LEFT HEART CATH AND CORONARY ANGIOGRAPHY N/A 09/30/2019   Procedure: LEFT HEART CATH AND CORONARY ANGIOGRAPHY;  Surgeon: JorMartiniqueeter M, MD;  Location: [facility];  Service: Cardiovascular;  Laterality: N/A;  . LEFT HEART CATH AND CORS/GRAFTS ANGIOGRAPHY N/A 09/27/2019   Procedure: LEFT HEART CATH AND CORS/GRAFTS ANGIOGRAPHY;  Surgeon: GolMinna MerrittsD;  Location: [facility];  Service: Cardiovascular;  Laterality: N/A;     Allergies:   Allergies  Allergen Reactions  . No [employer] Food Allergies Anaphylaxis and Hives    Raw onions     Social History:  reports that she has never smoked. She has never used smokeless tobacco. She reports that she does not drink alcohol or use drugs.   Family History: Family History  Problem Relation Age of Onset  . Stroke Mother        died @ 89 70 Multiple myeloma Father        died @ 93 88 Multiple myeloma Sister       Physical Exam: Vitals:   10/08/19 1723 10/08/19 1730 10/08/19 1745 10/08/19 1814  BP: (!) 156/63 (!) 160/70 (!) 159/61 140/65  Pulse:  88 90 87  Resp: 20 (!) 25 (!) 22 (!) 22  Temp: 97.9 F (36.6 C)  97.9 F (36.6 C) 98.6 F (37 C)  TempSrc:    Axillary  SpO2:  100% 99% 99%  Weight:      Height:        Constitutional: Fatigued, not in any acute distress. Eyes:  EOMI, irises appear normal, anicteric sclera,  ENMT: external ears and nose appear normal, normal hearing, Lips appear normal, oropharynx mucosa, tongue appear normal  Neck: neck appears normal, no masses, normal ROM, no thyromegaly, no JVD  CVS: S1-S2 clear, no murmur rubs or gallops, no LE edema, normal pedal pulses  Respiratory:  clear to auscultation bilaterally, no wheezing, rales or rhonchi. Respiratory effort normal. No accessory muscle use.  Abdomen: soft nontender, nondistended Musculoskeletal: : no cyanosis, clubbing or edema noted bilaterally; LLE with minimal movement, LUE flaccid Neuro: unable to successfully perform Psych:  Very flat mood and affect Skin: no rashes or  lesions or ulcers, no induration or nodules; persistent R groin hematoma        Data reviewed:  I have personally reviewed the recent labs and imaging studies  Pertinent Labs:   Glucose 178 BUN 49/Creatinine 1.06/GFR 54 WBC 17.7 Hgb 11.1 -> 9.5 -> 8.7 Heme positive   Inpatient Medications:   Scheduled Meds: . sodium chloride   Intravenous Once  . amantadine  100 mg Oral BID  . atorvastatin  80 mg Oral q1800  . chlorhexidine  15 mL Mouth Rinse BID  . clopidogrel  75 mg Oral Q breakfast  . feeding supplement  1 Container Oral BID BM  . insulin aspart  0-15 Units Subcutaneous TID WC  . mouth rinse  15 mL Mouth Rinse q12n4p  . sodium chloride flush  3 mL Intravenous Q12H   Continuous Infusions: . sodium chloride Stopped (10/08/19 1620)  . pantoprozole (PROTONIX) infusion       Radiological Exams on Admission: DG Chest Port 1 View  Result Date: 10/08/2019 CLINICAL DATA:  Pain EXAM: PORTABLE CHEST 1 VIEW COMPARISON:  September 26, 2019 FINDINGS: Lungs are clear. Heart is upper normal in size with pulmonary vascularity normal. No adenopathy. No pneumothorax. There is aortic atherosclerosis. There is degenerative change in the thoracic spine. IMPRESSION: Lungs clear. Heart upper normal in size. Aortic Atherosclerosis (ICD10-I70.0). Electronically Signed   By: Lowella Grip III M.D.   On: 10/08/2019 13:26   DG Abd Portable 1V  Result Date: 10/08/2019 CLINICAL DATA:  Abdominal pain EXAM: PORTABLE ABDOMEN - 1 VIEW COMPARISON:  October 01, 2019 FINDINGS: Mild stool in colon. No bowel dilatation or air-fluid level to suggest bowel obstruction. No free air evident on supine examination. Visualized lung bases clear. Surgical clips noted in right upper quadrant. IMPRESSION: No bowel obstruction or free air evident on supine examination. Electronically Signed   By: Lowella Grip III M.D.   On: 10/08/2019 13:26   VAS [place] ABI WITH/WO TBI  Result Date: 10/07/2019 LOWER EXTREMITY DOPPLER  STUDY Indications: Blue toes on left. High Risk Factors: Coronary artery disease.  Comparison Study: No prior study Performing Technologist: Maudry Mayhew MHA, RVT, RDCS, RDMS  Examination Guidelines: A complete evaluation includes at minimum, Doppler waveform signals and systolic blood pressure reading at the level of bilateral brachial, anterior tibial, and posterior tibial arteries, when vessel segments are accessible. Bilateral testing is considered an integral part of a complete examination. Photoelectric Plethysmograph (PPG) waveforms and toe systolic pressure readings are included as required and additional duplex testing as needed. Limited examinations for reoccurring indications may be performed as noted.  ABI Findings: +---------+------------------+-----+----------+--------+ Right    Rt Pressure (mmHg)IndexWaveform  Comment  +---------+------------------+-----+----------+--------+ Brachial 143  monophasic         +---------+------------------+-----+----------+--------+ PTA      151               0.97 triphasic          +---------+------------------+-----+----------+--------+ DP       133               0.86 triphasic          +---------+------------------+-----+----------+--------+ Great Toe138               0.89                    +---------+------------------+-----+----------+--------+ +---------+------------------+-----+----------+-------+ Left     Lt Pressure (mmHg)IndexWaveform  Comment +---------+------------------+-----+----------+-------+ Brachial 155                    triphasic         +---------+------------------+-----+----------+-------+ PTA      119               0.77 monophasic        +---------+------------------+-----+----------+-------+ DP       127               0.82 monophasic        +---------+------------------+-----+----------+-------+ Great Toe69                0.45                    +---------+------------------+-----+----------+-------+ +-------+-----------+-----------+------------+------------+ ABI/TBIToday's ABIToday's TBIPrevious ABIPrevious TBI +-------+-----------+-----------+------------+------------+ Right  0.97       0.89                                +-------+-----------+-----------+------------+------------+ Left   0.82       0.45                                +-------+-----------+-----------+------------+------------+  Summary: Right: Resting right ankle-brachial index is within normal range. No evidence of significant right lower extremity arterial disease. The right toe-brachial index is normal. Left: Resting left ankle-brachial index indicates mild left lower extremity arterial disease. The left toe-brachial index is abnormal.  *See table(s) above for measurements and observations.  Electronically signed by Monica Martinez MD on 10/07/2019 at 5:32:13 PM.    Final     Impression/Recommendations Principal Problem:   NSTEMI (non-ST elevated myocardial infarction) [facility]) Active Problems:   Acute systolic CHF (congestive heart failure) ([place])   Cerebral embolism with cerebral infarction   Malnutrition of moderate degree   Upper GI bleed   Status post coronary artery stent placement   Acute blood loss anemia  NSTEMI -Patient was admitted from 3/25-26 at [facility] for  NSTEMI with cath revealing severe multivessel CAD, EF 20%.   -She was transferred to [facility] for consideration of CABG.   -Patient was not thought to be a good candidate for CABG and instead had stenting with good result -She did develop post-procedure groin hematoma  MCA CVA with L hemiplegia -Patient with acute MCA CVA on 3/29 -Plavix was added to ASA; both have been stopped due to acute GI bleed -Has dysphagia, ST is following and she is on dysphagia 1 diet at this time -Started on amantadine -Started on Lipitor, although LDL is low -Given significant deficits, she is likely to  require SNF Rehab  Upper GI bleeding  with ABLA -Overnight, she developed worsening anemia with heme positive stools, concerning for UGI bleed -GI was consulted and she is planned to have EGD later today -Hold ASA and Plavix for now  Acute systolic CHF -Improved s/p stenting -Appears to be compensated at this time -She was started on Coreg and Entresto but these were held in the setting of ABLA-associated hypotension     Thank you for this consultation.  [facility] hospitalist team will assume care of the patient.  Time Spent: 61 minutes  Karmen Bongo M.D. [facility] 10/08/2019, 7:29 PM

## 2019-10-08 NOTE — Progress Notes (Signed)
   10/08/19 0343  Provider Notification  Provider Name/Title Dr Deforest Hoyles  Date Provider Notified 10/08/19  Time Provider Notified 0330  Notification Type Page  Notification Reason Change in status (Pt having bloody stools, BP 70/50)  Response See new orders  Date of Provider Response 10/08/19  Time of Provider Response [phone]

## 2019-10-08 NOTE — Progress Notes (Addendum)
STROKE TEAM PROGRESS NOTE   INTERVAL HISTORY Pt was seen after coming back from endo for EGD. Noted that duodenal ulcer found and treated.  Her BP much improved from am and s/p PRBC transfusion. However, she was very drowsy sleepy and neuro exam difficult to perform but noted that she has significant weakness on the left UE and LE. MRI brain pending   Vitals:   10/08/19 0527 10/08/19 0545 10/08/19 0627 10/08/19 0804  BP: (!) 138/56 127/85 131/73 122/65  Pulse:  99 100 (!) 104  Resp: 16 18    Temp:  98.5 F (36.9 C)  100.1 F (37.8 C)  TempSrc:  Oral  Axillary  SpO2:    100%  Weight:      Height:       CBC:  Recent Labs  Lab 10/08/19 0352 10/08/19 0648  WBC 16.1* 17.7*  HGB 9.5* 8.7*  HCT 30.2* 27.4*  MCV 101.0* 100.4*  PLT 380 349   Basic Metabolic Panel:  Recent Labs  Lab 10/05/19 0350 10/05/19 0350 10/06/19 0439 10/06/19 0439 10/07/19 0446 10/08/19 0319  NA 153*   < > 148*   < > 145 144  K 3.8   < > 3.5   < > 3.3* 4.3  CL 116*   < > 113*   < > 110 110  CO2 23   < > 24   < > 23 22  GLUCOSE 128*   < > 123*   < > 115* 178*  BUN 17   < > 14   < > 13 49*  CREATININE 1.14*   < > 1.01*   < > 0.90 1.06*  CALCIUM 9.4   < > 9.3   < > 8.8* 8.9  MG 1.8   < > 1.7  --   --  1.4*  PHOS 3.6  --  2.2*  --   --   --    < > = values in this interval not displayed.    IMAGING past 24 hours VAS [place] ABI WITH/WO TBI  Result Date: 10/07/2019 LOWER EXTREMITY DOPPLER STUDY Indications: Blue toes on left. High Risk Factors: Coronary artery disease.  Comparison Study: No prior study Performing Technologist: Gertie Fey MHA, RVT, RDCS, RDMS  Examination Guidelines: A complete evaluation includes at minimum, Doppler waveform signals and systolic blood pressure reading at the level of bilateral brachial, anterior tibial, and posterior tibial arteries, when vessel segments are accessible. Bilateral testing is considered an integral part of a complete examination. Photoelectric  Plethysmograph (PPG) waveforms and toe systolic pressure readings are included as required and additional duplex testing as needed. Limited examinations for reoccurring indications may be performed as noted.  ABI Findings: +---------+------------------+-----+----------+--------+ Right    Rt Pressure (mmHg)IndexWaveform  Comment  +---------+------------------+-----+----------+--------+ Brachial 143                    monophasic         +---------+------------------+-----+----------+--------+ PTA      151               0.97 triphasic          +---------+------------------+-----+----------+--------+ DP       133               0.86 triphasic          +---------+------------------+-----+----------+--------+ Great Toe138               0.89                    +---------+------------------+-----+----------+--------+ +---------+------------------+-----+----------+-------+  Left     Lt Pressure (mmHg)IndexWaveform  Comment +---------+------------------+-----+----------+-------+ Brachial 155                    triphasic         +---------+------------------+-----+----------+-------+ PTA      119               0.77 monophasic        +---------+------------------+-----+----------+-------+ DP       127               0.82 monophasic        +---------+------------------+-----+----------+-------+ Great Toe69                0.45                   +---------+------------------+-----+----------+-------+ +-------+-----------+-----------+------------+------------+ ABI/TBIToday's ABIToday's TBIPrevious ABIPrevious TBI +-------+-----------+-----------+------------+------------+ Right  0.97       0.89                                +-------+-----------+-----------+------------+------------+ Left   0.82       0.45                                +-------+-----------+-----------+------------+------------+  Summary: Right: Resting right ankle-brachial index is within  normal range. No evidence of significant right lower extremity arterial disease. The right toe-brachial index is normal. Left: Resting left ankle-brachial index indicates mild left lower extremity arterial disease. The left toe-brachial index is abnormal.  *See table(s) above for measurements and observations.  Electronically signed by Sherald Hess MD on 10/07/2019 at 5:32:13 PM.    Final     PHYSICAL EXAM  [name] not in distress. . Afebrile. Head is nontraumatic. Neck is supple without bruit.    Cardiac exam no murmur or gallop. Lungs are clear to auscultation. Distal pulses are well felt.  Neurological Exam :  She is very drowsy sleepy post procedure with anesthesia. Able to briefly open eyes on voice, but not able to maintain opening. With brief eye opening, eye right gaze preference but spontaneous rolling over to the left but not complete. Not blinking to visual threat bilaterally, not tracking consistently. PERRL. Left nasolabial fold flattening. Tongue protrusion not cooperative. RUE able to hold against gravity but LUE flaccid. RLE able to hold with knee flexion and foot on bed position but LLE flaccid slight withdraw to pain. Right DTR diminished, and no babinski. Sensation, coordination and gait not tested.   ASSESSMENT/PLAN Ms. [place] Gustafson is a 69 y.o. female with history of cholecystectomy and left rotator cuff repair presenting to [facility] 3/25 with intermittent N/V x 1 wk and intermittent epigastric pain x 2 weeks. Found to have a NSTEMI. Admitted for workup, which revealed CAD, HFrEF who developed AMS. CT head neg. Developed L facial droop, LUE weakness, dysarthria and LUE weakness. CT now w/ R basal ganglia infarct and possible L putamen abnormality d/t LVO. AMS felt d/t medication side effect.  .   Stroke: R parietal moderate infarct in setting of acute MI s/p cath w stent placement, transient cardiomyopathy with low EF, right M1  and M2 tandem stenosis with hypotension and severe anemia  CT head 3/28 No acute abnormality. Small vessel disease. Atrophy.   CT head 3/29 R basal ganglia infarct and L putamen infarct, age indeterminate.  Small old R pontine infarct   CTA head & neck distal R M1 moderate stenosis, R M2 inferior branch moderate to severe stenosis. Proximal and distal R PCA moderate stenosis.   CT perfusion 41ml penumbra R parietal love  CT head 3/30 acute R parietal lobe infarct.   MRI 4/6 pending   2D Echo 3/26 EF 25-30%   2D echo 3/31 EF 50-55%  EEG nonspecific R slowing, no sz  LDL 37  HgbA1c 5.9  SCDs for VTE prophylaxis  No antithrombotic prior to admission, now on clopidogrel 75 mg daily. Aspirin stopped d/t UGIB with severe anemia.    Therapy recommendations:  CIR vs. SNF  Disposition:  pending   UGIB with severe Anemia  Hx PUD on antacids prn PTA   Developed melena on aspirin and plavix  HGB 11->9.5->8.7   Transfuse PRBCs  On IV PPI  EGD - duodenal ulcer bleeding s/p treatment    CAD w/ NSTEMI  Severe diffuse 3v dz w/ severe LV dysfunction  S/p stent placement proximal mid LAD and mid CX  on plavix given sten  Aspirin stopped d/t melena and anemia  Acute systolic HF d/t NICM, resolved   EF 25-30% on 3/26  Put on coreg and entresto  EF 50-55% on 3/31  Coreg and entresto on hold given hypotension  R groin hematoma following fall  Hematoma at cath site  CT confirmed Hematoma  HGB 11->9.5->8.7   Hypotension . BP low at 70s in the setting of melena and severe anemia . Improved on IVF resuscitation . S/p PRBC transfusion . GI for endo today . BP goal normotensive . Long term goal 130-150 given severe right MCA tendam high grade stenosis  Lipid management  Home meds:  No statin listed  LDL 37, at goal < 70  Now on lipitor 80  Continue statin at discharge  Dysphagia Severe protein calorie malnutrition . Secondary to stroke . Speech  onboard, continues to follow . SLP cleared for D1 (puree) nectar thick liquid diet  Other Stroke Risk Factors  Advanced age  Overweight, Body mass index is 29.88 kg/m., recommend weight loss, diet and exercise as appropriate   Family hx stroke (mother)  New onset Congestive heart failure - HFrEF  NSVT  Other Active Problems  Hypokalemia/hypomagnesemia  Leukocytosis WBC 16.1->17.7  Encephalopathy - on amantadine   Hospital day # 11  I spent  35 minutes in total face-to-face time with the patient, more than 50% of which was spent in counseling and coordination of care, reviewing test results, images and medication, and discussing the diagnosis of NSTEMI s/p stent, right MCA stroke, right MCA stenosis, UGIB, severe anemia, hypotension, treatment plan and potential prognosis. This patient's care requiresreview of multiple databases, neurological assessment, discussion with family, other specialists and medical decision making of high complexity. I had long discussion with son over the phone, updated pt current condition, treatment plan and potential prognosis, and answered all the questions. He expressed understanding and appreciation.    Rosalin Hawking, MD PhD Stroke Neurology 10/08/2019 5:24 PM   To contact Stroke Continuity provider, please refer to [email or web address] After hours, contact General Neurology

## 2019-10-08 NOTE — Interval H&P Note (Signed)
History and Physical Interval Note:  10/08/2019 3:54 PM  Brittney Kennedy  has presented today for surgery, with the diagnosis of upper gi bleed.  The various methods of treatment have been discussed with the patient and family. After consideration of risks, benefits and other options for treatment, the patient has consented to  Procedure(s): ESOPHAGOGASTRODUODENOSCOPY (EGD) WITH PROPOFOL (N/A) as a surgical intervention.  The patient's history has been reviewed, patient examined, no change in status, stable for surgery.  I have reviewed the patient's chart and labs.  Questions were answered to the patient's satisfaction.     Viviann Spare P Aulden Calise

## 2019-10-08 NOTE — Progress Notes (Signed)
Pt taken to Endo for procedure. Patient receiving blood when patient taken to procedure. Blood product started at 1215 and VS taken after 15 minutes with no sign or symptoms of any adverse reactions. Blood product stopped with patient was in Endo and Endo RN document a stop time of 1558.

## 2019-10-08 NOTE — Progress Notes (Signed)
Son was made aware of the current situation with the patient, MD at bedside
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
## 2019-10-08 NOTE — Progress Notes (Signed)
Brief note:  Patient with loose melanotic stools. Drop in blood pressure (systolic in the 70s) and the patient noted to be more lethargic. Rapid response called to assess.  -Stool guaic -STAT CBC -Type and cross  -Normal saline bolus -GI consult  Will continue to re-assess for need to transfer to medical ICU if patient does not improve hemodynamically.  Brittney Kennedy Milinda Pointer, MD, [facility] Overnight coverage - Cardiology

## 2019-10-09 ENCOUNTER — Inpatient Hospital Stay (HOSPITAL_COMMUNITY): Payer: Medicare Other

## 2019-10-09 DIAGNOSIS — D62 Acute posthemorrhagic anemia: Secondary | ICD-10-CM

## 2019-10-09 LAB — PREPARE RBC (CROSSMATCH)

## 2019-10-09 LAB — GLUCOSE, CAPILLARY
Glucose-Capillary: 106 mg/dL — ABNORMAL HIGH (ref 70–99)
Glucose-Capillary: 113 mg/dL — ABNORMAL HIGH (ref 70–99)
Glucose-Capillary: 124 mg/dL — ABNORMAL HIGH (ref 70–99)
Glucose-Capillary: 124 mg/dL — ABNORMAL HIGH (ref 70–99)
Glucose-Capillary: 159 mg/dL — ABNORMAL HIGH (ref 70–99)

## 2019-10-09 LAB — CBC
HCT: 22.9 % — ABNORMAL LOW (ref 36.0–46.0)
HCT: 30 % — ABNORMAL LOW (ref 36.0–46.0)
Hemoglobin: 7.3 g/dL — ABNORMAL LOW (ref 12.0–15.0)
Hemoglobin: 9.4 g/dL — ABNORMAL LOW (ref 12.0–15.0)
MCH: 31.2 pg (ref 26.0–34.0)
MCH: 30.1 pg (ref 26.0–34.0)
MCHC: 31.9 g/dL (ref 30.0–36.0)
MCHC: 31.3 g/dL (ref 30.0–36.0)
MCV: 97.9 fL (ref 80.0–100.0)
MCV: 96.2 fL (ref 80.0–100.0)
Platelets: 239 10*3/uL (ref 150–400)
Platelets: 197 10*3/uL (ref 150–400)
RBC: 2.34 MIL/uL — ABNORMAL LOW (ref 3.87–5.11)
RBC: 3.12 MIL/uL — ABNORMAL LOW (ref 3.87–5.11)
RDW: 16.4 % — ABNORMAL HIGH (ref 11.5–15.5)
RDW: 19.2 % — ABNORMAL HIGH (ref 11.5–15.5)
WBC: 16.8 10*3/uL — ABNORMAL HIGH (ref 4.0–10.5)
WBC: 17.4 10*3/uL — ABNORMAL HIGH (ref 4.0–10.5)
nRBC: 0.7 % — ABNORMAL HIGH (ref 0.0–0.2)
nRBC: 0.7 % — ABNORMAL HIGH (ref 0.0–0.2)

## 2019-10-09 LAB — BASIC METABOLIC PANEL
Anion gap: 9 (ref 5–15)
BUN: 50 mg/dL — ABNORMAL HIGH (ref 8–23)
CO2: 22 mmol/L (ref 22–32)
Calcium: 8.5 mg/dL — ABNORMAL LOW (ref 8.9–10.3)
Chloride: 117 mmol/L — ABNORMAL HIGH (ref 98–111)
Creatinine, Ser: 1.17 mg/dL — ABNORMAL HIGH (ref 0.44–1.00)
GFR calc Af Amer: 56 mL/min — ABNORMAL LOW (ref >60)
GFR calc non Af Amer: 48 mL/min — ABNORMAL LOW (ref >60)
Glucose, Bld: 143 mg/dL — ABNORMAL HIGH (ref 70–99)
Potassium: 4.3 mmol/L (ref 3.5–5.1)
Sodium: 148 mmol/L — ABNORMAL HIGH (ref 135–145)

## 2019-10-09 LAB — HEMOGLOBIN AND HEMATOCRIT, BLOOD
HCT: 28.6 % — ABNORMAL LOW (ref 36.0–46.0)
Hemoglobin: 9.1 g/dL — ABNORMAL LOW (ref 12.0–15.0)

## 2019-10-09 LAB — MAGNESIUM: Magnesium: 2.2 mg/dL (ref 1.7–2.4)

## 2019-10-09 MED ORDER — SODIUM CHLORIDE 0.9% IV SOLUTION: Freq: Once | INTRAVENOUS | Status: AC

## 2019-10-09 MED ORDER — DEXTROSE 5 % IV SOLN: INTRAVENOUS | Status: AC

## 2019-10-09 NOTE — Progress Notes (Signed)
      Progress Note   Subjective  Patient lethargic this AM. Overnight report discussed with nursing staff. No bowel movements or bleeding overnight. Patient was turned this AM, no evidence of bleeding / stool.   Objective   Vital signs in last 24 hours: Temp:  [97.5 F (36.4 C)-98.9 F (37.2 C)] 98 F (36.7 C) (04/07 0459) Pulse Rate:  [74-99] 74 (04/07 0459) Resp:  [16-25] 18 (04/07 0459) BP: (111-160)/(44-70) 111/44 (04/07 0459) SpO2:  [99 %-100 %] 100 % (04/07 0459) Weight:  [72.3 kg] 72.3 kg (04/07 0500) Last BM Date: 10/08/19 General:    white female in NAD, in restraints Abdomen:  Soft, nondistended.    Intake/Output from previous day: 04/06 0701 - 04/07 0700 In: 1114.9 [I.V.:449.9; Blood:315; IV Piggyback:350] Out: -  Intake/Output this shift: No intake/output data recorded.  Lab Results: Recent Labs    10/08/19 0352 10/08/19 0648 10/09/19 0428  WBC 16.1* 17.7* 16.8*  HGB 9.5* 8.7* 7.3*  HCT 30.2* 27.4* 22.9*  PLT 380 349 239   BMET Recent Labs    10/07/19 0446 10/08/19 0319 10/09/19 0428  NA 145 144 148*  K 3.3* 4.3 4.3  CL 110 110 117*  CO2 23 22 22   GLUCOSE 115* 178* 143*  BUN 13 49* 50*  CREATININE 0.90 1.06* 1.17*  CALCIUM 8.8* 8.9 8.5*   LFT No results for input(s): PROT, ALBUMIN, AST, ALT, ALKPHOS, BILITOT, BILIDIR, IBILI in the last 72 hours. PT/INR No results for input(s): LABPROT, INR in the last 72 hours.  Studies/Results:     Assessment / Plan:   69 y/o female with history of NSTEMI s/p coronary stenting, CVA, who is on Plavix / aspirin and developed a severe upper GI bleed yesterday. Urgent EGD yesterday afternoon showed an actively bleeding duodenal bulb ulcer with visible vessel, treated with epi and hemostasis clips x 5. Hemostasis achieved at the time.   Overnight she has not had any bowel movements reported per nursing. Hgb has drifted to the 7s, BUN remains about the same. Large hematoma noted on right hip as well.    Given we can't stop the Plavix she is at risk for recurrent bleeding. If she is bleeding she should be passing it from below but haven't seen anything yet overnight per nursing. Would keep NPO this AM, monitor for recurrent bleeding, continue IV protonix drip. Not sure if Hgb in 7s is equilibration after transfusion and significant bleeding yesterday, versus recurrent bleeding, but would give another unit PRBC this AM and recheck post transfusion to ensure appropriate bump.  If she has recurrent / persistent bleeding despite endoscopic therapy yesterday, next step would be IR for embolization of the ulcer. Hopefully that is not needed but would monitor closely today.   Please call with any questions or changes in her status, will follow. Of note, I checked H pylori IgG serology, will need treatment if positive.  80, MD [facility] Gastroenterology

## 2019-10-09 NOTE — Progress Notes (Signed)
Pharmacy asked to evaluate PTA supplements/over the counter medications:  On pepcid complete, omeprazole, and ranitidine PTA - follow up on GI recommendations, currently on duplicate therapies   Vick's Nyquil + Dayquil - currently listed as frequency daily; would advise to only take as needed for cold/flu symptoms  Ginkgo - would advise against given increased risk for bleeding, especially on antiplatelet therapies  Echinacea/goldenseal - would advise against given increased risk for bleeding, especially on antiplatelet therapies  Phenylephrine (Sudafed PE) - currently listed as frequency daily; would advise to only take as needed for congestion   Other vitamins (B complex, biotin, bioflavonoid, calcium carbonate/vitamin D, and multivitamin) safe to continue at discretion of MD.   Please do no hesitate to call if any further questions.  Thanks,  Sherron Monday, PharmD, BCCCP Clinical Pharmacist  Phone: [phone] 10/09/2019 4:07 PM  Please check AMION for all [facility] Pharmacy phone numbers After 10:00 PM, call Main Pharmacy [phone]

## 2019-10-09 NOTE — Progress Notes (Signed)
SLP Cancellation Note  Patient Details Name: Gene Colee MRN: [id number] DOB: 28-Jun-1951   Cancelled treatment:       Reason Eval/Treat Not Completed: Medical issues which prohibited therapy. Pt remains NPO per GI recommendations. Results of EGD reviewed. SLP will f/u as able.    Mahala Menghini., M.A. CCC-SLP Acute Rehabilitation Services Pager [phone] Office [phone]  10/09/2019, 3:03 PM

## 2019-10-09 NOTE — Progress Notes (Signed)
PROGRESS NOTE   Brittney Kennedy  IPJ:[id number]    DOB: 03/05/1951    DOA: 09/27/2019  PCP: Patient, No Pcp Per   I have briefly reviewed patients previous medical records in [facility].  Chief Complaint:     Brief Narrative:  69 year old female with PMH of chronic systolic CHF and CAD who was admitted from 3/25-3/26 at [facility] for NSTEMI with cath revealing severe multivessel CAD, EF 20%.  She was transferred to [facility] for CHF optimization and CT surgical evaluation for CABG.  Hospital course complicated by fall and right groin hematoma, acute encephalopathy, acute CVA, acute upper GI bleed due to duodenal ulcer s/p EGD 4/6, acute blood loss anemia requiring blood transfusions.  Cardiology, neurology, [facility] on board.  Care transferred to [facility] on 4/7.   Assessment & Plan:  Principal Problem:   NSTEMI (non-ST elevated myocardial infarction) [facility] Active Problems:   Acute systolic CHF (congestive heart failure) (HCC)   Cerebral embolism with cerebral infarction   Malnutrition of moderate degree   Upper GI bleed   Status post coronary artery stent placement   Acute blood loss anemia   CAD with NSTEMI: Cath 3/26 with severe diffuse three-vessel CAD, EF 20%.  Transferred from [facility] to [facility] for CABG evaluation but not felt to be an appropriate candidate.  S/p PCI of proximal LAD with DES x1 and PCI of mid circumflex with DES x1 on 3/29.  TTE 10/02/2019 showed improved EF up to 50-55%.  No anginal symptoms.  Aspirin on hold due to GI bleed.  Continuing Plavix and statins.  Cardiology following.  Acute systolic CHF due to NICM: Initial LVEF 25%.  Repeat TTE 3/31 showed LVEF has normalized to 50-55%.  Cardiology following and managing, plan to resume ARNI and beta-blocker soon.  Right groin hematoma sustained status post fall: Had hematoma at cath site.  Ultrasound negative for pseudoaneurysm.  CT positive for hematoma.  Site appears stable.  Does not seem to be the cause for blood loss anemia  at this time.  Holding Lovenox.  Hypokalemia and hypomagnesemia: Replaced.  Severe protein calorie malnutrition:  Physical deconditioning: Therapies to continue to work with her.  Acute MCA stroke with left hemiplegia: CT head 3/28 without acute findings.  CT head 3/29 with right basilar infarct, small right pontine infarct.  CT head 3/30: Acute infarct centered in the right parietal lobe.  Mental status changes improving per son at bedside.  Still has dysarthria and aphasia.  Currently n.p.o. due to GI bleed.  When able, resume D1 diet.  DC to CIR when able.  Remains on Plavix.  Repeat MRI brain 4/7: Large infarct in the posterior right MCA territory, including confluent ischemia in the deep right MCA white matter, and the splenium of the corpus callosum.  Aside from the splenium, these areas were abnormal on CTP last week.  There is also evidence of patchy infarcts in the deep left MCA white matter, not evident last week.  No significant intracranial mass-effect. Dr. Roda Shutters, neurology aware of these results.  Hypernatremia: Sodium 148.  Since patient NPO and currently hypernatremic, will place her on small dose of IV D5 infusion, reviewed with neurology and okay.  Also EF has normalized so should be able to tolerate.  NSVT: Holding beta-blockers due to recent acute upper GI bleed.  Hypomagnesemia and hypokalemia replaced.  Acute upper GI bleed due to duodenal ulcer: S/p urgent EGD 4/6 which showed duodenal bulb ulcer with visible blood vessel, endoclips x5 placed.  No BM for the last 24 hours.  [facility] following.  Remains NPO IV Protonix infusion.  Aspirin discontinued but remains on Plavix due to recently placed coronary DES.  Acute blood loss anemia: Secondary to GI bleed.  Hemoglobin dropped to 7.3 on 4/7 despite 2 units PRBCs on 4/6.  Transfuse additional 1 unit PRBC (total 3 units thus far) and hemoglobin up to 9.1.  In the absence of any other overt sources of bleeding, hemoglobin drop of 7.3  may be equilibration from prior versus recurrent bleeding.  Follow CBCs closely tonight and then a.m.  Transfuse if hemoglobin 8 g or less.  Leukocytosis: Likely stress response.  No overt signs or symptoms of infection.  Urine culture positive but no urinary symptoms.  Unless develops fever or clear signs and symptoms of infection, hold off antibiotics.  Acute encephalopathy: Likely multifactorial related to recent acute stroke, medications and others.  Better today per patient's son at bedside.  Dysphagia: Currently NPO.  Prediabetes: A1c 5.9 on 3/25.  SSI.   Body mass index is 29.15 kg/m.  Nutritional Status Nutrition Problem: Moderate Malnutrition Etiology: acute illness Signs/Symptoms: mild muscle depletion, mild fat depletion Interventions: Tube feeding  DVT prophylaxis: SCDs Code Status: Full Family Communication: Discussed in detail with patient's son who is an Char[employer] fundraiser in [place], at bedside, updated care and answered questions.  Patient's best friend was also at bedside. Disposition:  . Patient came from: Home           . Anticipated d/c place: CIR . Barriers to d/c: Recent acute upper GI bleed due to bleeding duodenal ulcer, acute blood loss anemia that requires close monitoring and intervention   Consultants:   Cardiology Neurology [facility] MD  Procedures:   EGD 4/6  Antimicrobials:   None   Subjective:  Patient seen this morning.  Patient's son and her best friend were at bedside.  Patient sleepy but easily arousable, appeared lethargic, oriented to self and to her son.  Significantly dysarthric and unable to understand her speech.  As per son, patient is more alert and less confused compared to yesterday earlier today but not when I visited her.  No BM in the last 24 hours up to this morning.  Objective:   Vitals:   10/09/19 0940 10/09/19 1205 10/09/19 1215 10/09/19 1636  BP: (!) 119/39  (!) 150/78 139/74  Pulse: 80  86 85  Resp: 16   18  Temp: 98.2  F (36.8 C) 98.2 F (36.8 C) 98.2 F (36.8 C) 98.4 F (36.9 C)  TempSrc: Oral  Oral Oral  SpO2: 100%  100% 100%  Weight:      Height:       Patient was examined along with her female nurse as chaperone in the room General exam: Middle-aged female, moderately built and overweight sitting up comfortably in reclining chair this morning without distress. Respiratory system: Clear to auscultation. Respiratory effort normal. Cardiovascular system: S1 & S2 heard, RRR. No JVD, murmurs, rubs, gallops or clicks. No pedal edema.  Telemetry personally reviewed: Sinus rhythm.  Occasional episodes of NS SVT. Gastrointestinal system: Abdomen is nondistended, soft and nontender. No organomegaly or masses felt. Normal bowel sounds heard.  Right groin with subacute bruising/hematoma, does not appear new. Central nervous system: Mental status as noted above.  Dysarthria.  Facial asymmetry +. Extremities: Spontaneously moves right upper extremity.  Right hand in mittens.  Left-sided hemiplegia. Skin: No rashes, lesions or ulcers Psychiatry: Judgement and insight impaired. Mood &  affect cannot be assessed at this time.     Data Reviewed:   I have personally reviewed following labs and imaging studies   CBC: Recent Labs  Lab 10/08/19 0352 10/08/19 0352 10/08/19 0648 10/09/19 0428 10/09/19 1439  WBC 16.1*  --  17.7* 16.8*  --   HGB 9.5*   < > 8.7* 7.3* 9.1*  HCT 30.2*   < > 27.4* 22.9* 28.6*  MCV 101.0*  --  100.4* 97.9  --   PLT 380  --  349 239  --    < > = values in this interval not displayed.    Basic Metabolic Panel: Recent Labs  Lab 10/04/19 0400 10/04/19 0400 10/05/19 0350 10/05/19 0350 10/06/19 0439 10/06/19 0439 10/07/19 0446 10/08/19 0319 10/09/19 0428  NA 150*   < > 153*   < > 148*   < > 145 144 148*  K 4.3   < > 3.8   < > 3.5   < > 3.3* 4.3 4.3  CL 115*   < > 116*   < > 113*   < > 110 110 117*  CO2 21*   < > 23   < > 24   < > 23 22 22   GLUCOSE 109*   < > 128*   < >  123*   < > 115* 178* 143*  BUN 18   < > 17   < > 14   < > 13 49* 50*  CREATININE 1.17*   < > 1.14*   < > 1.01*   < > 0.90 1.06* 1.17*  CALCIUM 9.4   < > 9.4   < > 9.3   < > 8.8* 8.9 8.5*  MG 1.8   < > 1.8   < > 1.7  --   --  1.4* 2.2  PHOS 3.9  --  3.6  --  2.2*  --   --   --   --    < > = values in this interval not displayed.    Liver Function Tests: No results for input(s): AST, ALT, ALKPHOS, BILITOT, PROT, ALBUMIN in the last 168 hours.  CBG: Recent Labs  Lab 10/09/19 0316 10/09/19 0818 10/09/19 1204  GLUCAP 124* 124* 113*    Microbiology Studies:   Recent Results (from the past 240 hour(s))  Culture, Urine     Status: Abnormal   Collection Time: 10/04/19  2:50 AM   Specimen: Urine, Clean Catch  Result Value Ref Range Status   Specimen Description URINE, CLEAN CATCH  Final   Special Requests   Final    NONE Performed at [facility], [address] [place], [place]    Culture >=100,000 COLONIES/mL AEROCOCCUS VIRIDANS (A)  Final   Report Status 10/06/2019 FINAL  Final     Radiology Studies:  MR BRAIN WO CONTRAST  Addendum Date: 10/09/2019   ADDENDUM REPORT: 10/09/2019 17:24 ADDENDUM: Study discussed by telephone with Dr. 12/09/2019 on 10/09/2019 at 1715 hours. We discussed the heterogeneous appearance of the brainstem and cerebellum on axial DWI. But I favor that is artifact, with no convincing infratentorial diffusion abnormality on the coronal images. However, both sets of images do suggest a small area of susceptibility in the central lower pons likely from chronic microhemorrhage (series 3, image 18 and series 4, image 19). Electronically Signed   By: 12/09/2019 M.D.   On: 10/09/2019 17:24   Result Date: 10/09/2019 CLINICAL DATA:  69 year old female,  code stroke presentation on 09/30/2019. Posterior right hemisphere cytotoxic edema evident by CT the next day. EXAM: MRI HEAD WITHOUT CONTRAST TECHNIQUE: Multiplanar, multiecho pulse sequences of the brain and  surrounding structures were obtained without intravenous contrast. COMPARISON:  CT head 10/01/2019, CTA and CTP 09/30/2019 FINDINGS: The examination had to be discontinued prior to completion due to patient inability to tolerate. Axial and coronal DWI imaging only was obtained. There is a large 6 cm area of confluent restricted diffusion in the right parietal lobe, with contiguous additional restricted diffusion in the deep right MCA territory white matter (series 3, image 38). Much of this corresponds to abnormal perfusion T-max on the comparison. Additionally there is confluent diffusion restriction in the splenium of the corpus callosum on series 3, image 32. There is also abnormal diffusion in the contralateral left MCA periventricular white matter (also on image 38) although affecting much smaller area. Furthermore, there is probably artifactual signal in the occipital lobes on series 3, which is not correlated on coronal diffusion series 4 (series 3, image 28 versus series 4, image 9). No midline shift or significant intracranial mass effect. Basilar cisterns appear patent. No definite ventriculomegaly. IMPRESSION: 1. Exam limited to DWI secondary to patient inability to tolerate. 2. Large infarct in the posterior Right MCA territory, including confluent ischemia in the deep Right MCA white matter, and the splenium of the corpus callosum. Aside from the splenium, these areas were abnormal on CTP last week. 3. But there is also evidence of patchy infarcts in the deep Left MCA white matter, not evident last week. 4. No significant intracranial mass effect. Electronically Signed: By: Genevie Ann M.D. On: 10/09/2019 16:08     Scheduled Meds:   . sodium chloride   Intravenous Once  . amantadine  100 mg Oral BID  . atorvastatin  80 mg Oral q1800  . chlorhexidine  15 mL Mouth Rinse BID  . clopidogrel  75 mg Oral Q breakfast  . feeding supplement  1 Container Oral BID BM  . insulin aspart  0-15 Units  Subcutaneous TID WC  . mouth rinse  15 mL Mouth Rinse q12n4p  . sodium chloride flush  3 mL Intravenous Q12H    Continuous Infusions:   . sodium chloride Stopped (10/08/19 1620)  . pantoprozole (PROTONIX) infusion Stopped (10/09/19 1500)     LOS: 12 days     Vernell Leep, MD, [place], [facility]. [facility]    To contact the attending provider between 7A-7P or the covering provider during after hours 7P-7A, please log into the web site [email or web address] and access using universal [place] password for that web site. If you do not have the password, please call the hospital operator.  10/09/2019, 7:12 PM

## 2019-10-09 NOTE — Anesthesia Postprocedure Evaluation (Signed)
Anesthesia Post Note  Patient: Ascencion Stegner  Procedure(s) Performed: ESOPHAGOGASTRODUODENOSCOPY (EGD) WITH PROPOFOL (N/A ) HEMOSTASIS CONTROL     Patient location during evaluation: PACU Anesthesia Type: General Level of consciousness: awake and alert Pain management: pain level controlled Vital Signs Assessment: post-procedure vital signs reviewed and stable Respiratory status: spontaneous breathing, nonlabored ventilation, respiratory function stable and patient connected to nasal cannula oxygen Cardiovascular status: blood pressure returned to baseline and stable Postop Assessment: no apparent nausea or vomiting Anesthetic complications: no    Last Vitals:  Vitals:   10/09/19 1205 10/09/19 1215  BP:  (!) 150/78  Pulse:  86  Resp:    Temp: 36.8 C 36.8 C  SpO2:  100%    Last Pain:  Vitals:   10/09/19 1215  TempSrc: Oral  PainSc:                  Shelton Silvas

## 2019-10-09 NOTE — Social Work (Signed)
CSW was unable to complete sbirt due to pt only being oriented x2. Sbirt may attempt to complete at more appropriate time.   Jimmy Picket, Theresia Majors, Bridget Hartshorn Licensed Clinical Social Worker [phone]

## 2019-10-09 NOTE — Progress Notes (Signed)
Physical Therapy Treatment Patient Details Name: Brittney Kennedy MRN: [id number] DOB: 05-08-1951 Today's Date: 10/09/2019    History of Present Illness 69 year old female to the hospital for nausea, vomiting, epigastric pain x 1 week, admitted 09/27/19 for NSTEMI. cath 3/26 revealed severe multivessel CAD. Patient transferred to [facility] Fall 3/27 with R groin hematoma. 3/28 patient with AMS due to pain meds and rapid response 3/29 due to AMS and L facial droop with weakness. CT: R basal ganglia infarct. PMH: CAD, cholecystectomy    PT Comments    PT and OT saw pt together for mobility progression and safety during transfers. Pt agreeable to OOB mobility this session, but is highly focused on getting ice chips. Pt required max-total +2 for bed mobility and transfer to recliner at bedside, with emphasis placed on standing balance and LLE blocking during stance and facilitation during LLE pivotal steps to recliner. Pt continues to present with significant L neglect, and does not cross visual midline towards left even with max cuing. PT feels pt would be a great CIR candidate, as pt motivated to participate in therapies, and CIR would provide an intensive program to maximize pt mobility and functional outcomes post-acutely. Will continue to follow.    Follow Up Recommendations  CIR     Equipment Recommendations  Other (comment)(TBD at next level of care)    Recommendations for Other Services Rehab consult     Precautions / Restrictions Precautions Precautions: Fall;Other (comment) Precaution Comments: aspiration precautions (dysphagia 1 with nectar), HOB >30 degrees Restrictions Weight Bearing Restrictions: No    Mobility  Bed Mobility Overal bed mobility: Needs Assistance Bed Mobility: Supine to Sit     Supine to sit: Total assist;+2 for safety/equipment;+2 for physical assistance;HOB elevated     General bed mobility comments: Total +2 for LE management, scooting to EOB with use of bed  pad, and trunk elevation. Special attention to LUE/LLE due to hemiparesis.  Transfers Overall transfer level: Needs assistance Equipment used: 2 person hand held assist Transfers: Sit to/from [facility] Sit to Stand: Max assist;+2 physical assistance;+2 safety/equipment;From elevated surface Stand pivot transfers: Max assist;+2 physical assistance;+2 safety/equipment;From elevated surface       General transfer comment: Sit to stand with PT/OT HHA x2, max assist for power up, hip extension to neutral, steadying, and LLE blocking due to paresis. Second stand attempt more successful with more hip facilitation from PT and truncal/RUE support from OT. Pt with pivot on RLE to reach recliner, with max +2 for steadying, guiding pt trajectory to recliner, LLE blocking in stance and facilitation during LLE translation, and slow eccentric lowering into recliner.  Ambulation/Gait             General Gait Details: unable this day   Stairs             Wheelchair Mobility    Modified Rankin (Stroke Patients Only) Modified Rankin (Stroke Patients Only) Pre-Morbid Rankin Score: No symptoms Modified Rankin: Severe disability     Balance Overall balance assessment: Needs assistance Sitting-balance support: Single extremity supported;Feet supported Sitting balance-Leahy Scale: Fair Sitting balance - Comments: fair to poor - able to sit unassisted for up to 1 minute with use of RUE on bedrail, heavy posterior and L lateral leaning without external support. Pt sat EOB x10 minutes, working on sitting balance, LUE propping, finding midline, and working on crossing midline to L with cues with ice chips. Postural control: Posterior lean;Left lateral lean Standing balance support: Bilateral upper extremity  supported Standing balance-Leahy Scale: Zero Standing balance comment: max +2 for transfer to recliner                            Cognition Arousal/Alertness:  Awake/alert(drowsy, in and out of true alertness) Behavior During Therapy: Flat affect Overall Cognitive Status: Difficult to assess Area of Impairment: Attention                   Current Attention Level: Sustained           General Comments: A&O to self, location. Pt aware of LUE and LLE weakness today. Pt perseverating on ice chips, incorporated to work on L inattention vs neglect.      Exercises General Exercises - Lower Extremity Ankle Circles/Pumps: AROM;Both;10 reps;Seated Other Exercises Other Exercises: positioning in recliner with LUE supported, bilateral heels floating, and pillows wedged behind R shoulder to keep pt in midline of chair    General Comments General comments (skin integrity, edema, etc.): Pt receiving blood, VSS and IV intact pre-, during, and post-session. Mitt reapplied to R hand post-session      Pertinent Vitals/Pain Pain Assessment: Faces Faces Pain Scale: Hurts little more Pain Location: RLE secondary to hematoma Pain Descriptors / Indicators: Grimacing;Discomfort Pain Intervention(s): Limited activity within patient's tolerance;Monitored during session;Repositioned    Home Living                      Prior Function            PT Goals (current goals can now be found in the care plan section) Acute Rehab PT Goals Patient Stated Goal: return to independence PT Goal Formulation: With patient/family Time For Goal Achievement: 10/14/19 Potential to Achieve Goals: Good Progress towards PT goals: Progressing toward goals    Frequency    Min 4X/week      PT Plan Current plan remains appropriate    Co-evaluation PT/OT/SLP Co-Evaluation/Treatment: Yes Reason for Co-Treatment: For patient/therapist safety;To address functional/ADL transfers PT goals addressed during session: Mobility/safety with mobility;Balance        AM-PAC PT "6 Clicks" Mobility   Outcome Measure  Help needed turning from your back to your  side while in a flat bed without using bedrails?: A Lot Help needed moving from lying on your back to sitting on the side of a flat bed without using bedrails?: Total Help needed moving to and from a bed to a chair (including a wheelchair)?: Total Help needed standing up from a chair using your arms (e.g., wheelchair or bedside chair)?: A Lot Help needed to walk in hospital room?: Total Help needed climbing 3-5 steps with a railing? : Total 6 Click Score: 8    End of Session Equipment Utilized During Treatment: Gait belt Activity Tolerance: Patient limited by lethargy Patient left: in bed;with call bell/phone within reach;with bed alarm set Nurse Communication: Mobility status PT Visit Diagnosis: Other abnormalities of gait and mobility (R26.89);Muscle weakness (generalized) (M62.81);Hemiplegia and hemiparesis;Other symptoms and signs involving the nervous system (R29.898) Hemiplegia - Right/Left: Left Hemiplegia - dominant/non-dominant: Non-dominant     Time: 4818-5631 PT Time Calculation (min) (ACUTE ONLY): 27 min  Charges:  $Therapeutic Activity: 8-22 mins                    Andree Heeg E, PT Acute Rehabilitation Services Pager [phone]  Office [phone]    Leone Putman D Bryttney Netzer 10/09/2019, 11:26 AM

## 2019-10-09 NOTE — Progress Notes (Signed)
Attempted to notarize AD for patient.  Before arousing patient checked for completed AD in room which could not be found.  Checking with nurse ascertained patient was restless and at times incoherent through the night.  Hands were covered in mittens.  Nurse reported if she were patient's family she would not want her to sign anything at this time.  Nurse aroused patient and asked if she had knowledge of an AD.  In a semi-aroused state patient denied knowing what that was. Nurse suggested Notary return at a later time when son was present (who may also have the AD).  Chaplain will attempt notary at a later time.  Vernell Morgans Chaplain Resident

## 2019-10-09 NOTE — Progress Notes (Signed)
STROKE TEAM PROGRESS NOTE   INTERVAL HISTORY Son at bedside. Pt lying in bed, more awake alert today, able to follow simplecommands right, however still has severe dysarthria, left neglect, right gaze preference, right hemiplegia.  MRI showed right MCA large infarct.  Still has anemia, received 1 unit PRBC transfusion.  Currently still n.p.o.  I had long discussion with son at bedside, updated pt current condition, treatment plan and potential prognosis, and answered all the questions. He expressed understanding and appreciation.     Vitals:   10/09/19 0925 10/09/19 0940 10/09/19 1205 10/09/19 1215  BP: (!) 139/49 (!) 119/39  (!) 150/78  Pulse: 87 80  86  Resp: 20 16    Temp: 98.4 F (36.9 C) 98.2 F (36.8 C) 98.2 F (36.8 C) 98.2 F (36.8 C)  TempSrc: Oral Oral  Oral  SpO2: 100% 100%  100%  Weight:      Height:       CBC:  Recent Labs  Lab 10/08/19 0648 10/08/19 0648 10/09/19 0428 10/09/19 1439  WBC 17.7*  --  16.8*  --   HGB 8.7*   < > 7.3* 9.1*  HCT 27.4*   < > 22.9* 28.6*  MCV 100.4*  --  97.9  --   PLT 349  --  239  --    < > = values in this interval not displayed.   Basic Metabolic Panel:  Recent Labs  Lab 10/05/19 0350 10/05/19 0350 10/06/19 0439 10/07/19 0446 10/08/19 0319 10/09/19 0428  NA 153*   < > 148*   < > 144 148*  K 3.8   < > 3.5   < > 4.3 4.3  CL 116*   < > 113*   < > 110 117*  CO2 23   < > 24   < > 22 22  GLUCOSE 128*   < > 123*   < > 178* 143*  BUN 17   < > 14   < > 49* 50*  CREATININE 1.14*   < > 1.01*   < > 1.06* 1.17*  CALCIUM 9.4   < > 9.3   < > 8.9 8.5*  MG 1.8   < > 1.7  --  1.4* 2.2  PHOS 3.6  --  2.2*  --   --   --    < > = values in this interval not displayed.    IMAGING past 24 hours No results found.  PHYSICAL EXAM  [name] not in distress. Afebrile. Head is nontraumatic. Neck is supple without bruit.    Cardiac exam no murmur or gallop. Lungs are clear to auscultation. Distal pulses are well  felt.  Neurological Exam :  She is more awake alert and interactive, eyes open but still lethargic. With eye opening, eye right gaze preference and barely cross midline. Not blinking to visual threat on the left, but blinking to visual threat on the right, able to track on the right. PERRL. Left facial droop. Tongue protrusion not corporative. RUE 4/5 but LUE flaccid. RLE at least 3/5 but LLE mild withdraw proximally on pain stimulation, but 3 to 4/5 foot DF/PF spontaneously.  Left DTR diminished, and no babinski. Sensation, coordination not corporative and gait not tested.   ASSESSMENT/PLAN [name] is a 69 y.o. female with history of cholecystectomy and left rotator cuff repair presenting to [place]. [facility] 3/25 with intermittent N/V x 1 wk and intermittent epigastric pain x 2 weeks. Found  to have a NSTEMI. Admitted for workup, which revealed CAD, HFrEF who developed AMS. CT head neg. Developed L facial droop, LUE weakness, dysarthria and LUE weakness. CT now w/ R basal ganglia infarct and possible L putamen abnormality d/t LVO. AMS felt d/t medication side effect.  .   Stroke: R MCA large infarct, along with small left CR and splenium infarcts, embolic pattern more likely due to transient cardiomyopathy with low EF, but right M1 and M2 tandem stenosis with hypotension and severe anemia also contributes  CT head 3/28 No acute abnormality. Small vessel disease. Atrophy.   CT head 3/29 R basal ganglia infarct and L putamen infarct, age indeterminate. Small old R pontine infarct   CTA head & neck distal R M1 moderate stenosis, R M2 inferior branch moderate to severe stenosis. Proximal and distal R PCA moderate stenosis.   CT perfusion 47ml penumbra R parietal love  CT head 3/30 acute R parietal lobe infarct.   MRI 4/6 right MCA large infarct, small left CR and splenium infarcts  2D Echo 3/26 EF 25-30%   2D echo 3/31 EF 50-55%  EEG nonspecific R slowing, no sz  LDL  37  HgbA1c 5.9  SCDs for VTE prophylaxis  No antithrombotic prior to admission, now on clopidogrel 75 mg daily. Aspirin stopped d/t UGIB with severe anemia.    Therapy recommendations:  CIR vs. SNF  Disposition:  pending   UGIB with severe Anemia  Hx PUD on antacids prn PTA   Developed melena on aspirin and plavix  HGB 11->9.5->8.7->7.3->PRBC->9.1  Transfuse PRBCs  On IV PPI  EGD - duodenal ulcer bleeding s/p treatment    NPO for now as per GI  CAD w/ NSTEMI  Severe diffuse 3v dz w/ severe LV dysfunction  S/p stent placement proximal mid LAD and mid CX  on plavix given sten  Aspirin stopped d/t melena and anemia  Acute systolic HF d/t NICM, resolved   EF 25-30% on 3/26  Put on coreg and entresto  EF 50-55% on 3/31  Coreg and entresto were on hold due to hypotension  R groin hematoma following fall  Hematoma at cath site  CT confirmed Hematoma  HGB 11->9.5->8.7->7.3->9.1  Hypotension, resolved . BP low at 70s in the setting of melena and severe anemia . Improved on IVF resuscitation . S/p PRBC transfusion . BP much improved . Long term goal 130-150 given severe right MCA tendam high grade stenosis  Lipid management  Home meds:  No statin listed  LDL 37, at goal < 70  Now on lipitor 80  Continue statin at discharge  Dysphagia Severe protein calorie malnutrition . Secondary to stroke . Speech onboard, continues to follow . SLP cleared for D1 (puree) nectar thick liquid diet . Currently n.p.o. due to GI bleeding  Other Stroke Risk Factors  Advanced age  Overweight, Body mass index is 29.15 kg/m., recommend weight loss, diet and exercise as appropriate   Family hx stroke (mother)  New onset Congestive heart failure - HFrEF  NSVT  Other Active Problems  Hypokalemia/hypomagnesemia  Leukocytosis WBC 16.1->17.7->16.8  Encephalopathy - on amantadine   Hypernatremia -> IVF with [facility] day # 12  I spent  35 minutes in  total face-to-face time with the patient, more than 50% of which was spent in counseling and coordination of care, reviewing test results, images and medication, and discussing the diagnosis of NSTEMI s/p stent, right MCA stroke, right MCA stenosis, UGIB, severe anemia, hypotension, treatment plan and  potential prognosis. This patient's care requiresreview of multiple databases, neurological assessment, discussion with family, other specialists and medical decision making of high complexity. I had long discussion with son at bedside, updated pt current condition, treatment plan and potential prognosis, and answered all the questions. He expressed understanding and appreciation.    Marvel Plan, MD PhD Stroke Neurology 10/09/2019 4:03 PM   To contact Stroke Continuity provider, please refer to [email or web address]. After hours, contact General Neurology

## 2019-10-09 NOTE — Progress Notes (Addendum)
Advanced Heart Failure Rounding Note   Subjective:    Events  - cath 09/27/19 3v CAD. EF 20% - right groin hematoma. U/s 3/27 negative for PSA - 3/28 altered mental status due to pain meds. Head CT, ABG and ammonia normal - 09/30/19 CVA, ACS with DES LAD and mid CX - 3/30 Headache with stat CT. EEG- no seizure, cortical dysfunction in the right hemisphere likely secondary to underlying infarct as well as moderate to severe diffuse encephalopathy, nonspecific to etiology. - 3/30 Echo EF 50-55% - 4/6 developed GIB. Hgb 7.3. Transfused x 2 units. Urgent EGD 4/6 showed an actively bleeding duodenal bulb ulcer with visible vessel, treated with epi and hemostasis clips x 5. Hemostasis achieved   Got another unit of RBCs today,. Hgb up to 9.1 today. No further melena  Much more talkative but remains dysarthris   Objective:   Weight Range:  Vital Signs:   Temp:  [97.9 F (36.6 C)-98.8 F (37.1 C)] 98.2 F (36.8 C) (04/07 1215) Pulse Rate:  [74-90] 86 (04/07 1215) Resp:  [16-25] 16 (04/07 0940) BP: (111-160)/(39-78) 150/78 (04/07 1215) SpO2:  [98 %-100 %] 100 % (04/07 1215) Weight:  [72.3 kg] 72.3 kg (04/07 0500) Last BM Date: 10/08/19  Weight change: Filed Weights   10/06/19 0528 10/07/19 0431 10/09/19 0500  Weight: 72.6 kg 74.1 kg 72.3 kg    Intake/Output:   Intake/Output Summary (Last 24 hours) at 10/09/2019 1632 Last data filed at 10/09/2019 1500 Gross per 24 hour  Intake 1215.02 ml  Output 100 ml  Net 1115.02 ml    PHYSICAL EXAM: General:  WF sitting up in bed  No resp difficulty HEENT: Left facial droop.  Neck: supple. no JVD. Carotids 2+ bilat; no bruits. No lymphadenopathy or thryomegaly appreciated. Cor: PMI nondisplaced. Regular rate & rhythm. No rubs, gallops or murmurs. Lungs: clear, no wheezing  Abdomen: soft, nontender, nondistended. No hepatosplenomegaly. No bruits or masses. Good bowel sounds. Extremities: no cyanosis, clubbing, rash, edema. R groin  ecchymotic.  LUE flacid. LLE weak. + bilateral SCDs Neuro: Aphasic. LUE flaccid. LLE weak.    Telemetry: NSR/ sinus tach 90s-low 100s   Labs: Basic Metabolic Panel: Recent Labs  Lab 10/03/19 0258 10/03/19 0258 10/04/19 0400 10/04/19 0400 10/05/19 0350 10/05/19 0350 10/06/19 0439 10/06/19 0439 10/07/19 0446 10/08/19 0319 10/09/19 0428  NA 147*   < > 150*   < > 153*  --  148*  --  145 144 148*  K 4.5   < > 4.3   < > 3.8  --  3.5  --  3.3* 4.3 4.3  CL 115*   < > 115*   < > 116*  --  113*  --  110 110 117*  CO2 19*   < > 21*   < > 23  --  24  --  23 22 22   GLUCOSE 105*   < > 109*   < > 128*  --  123*  --  115* 178* 143*  BUN 19   < > 18   < > 17  --  14  --  13 49* 50*  CREATININE 1.26*   < > 1.17*   < > 1.14*  --  1.01*  --  0.90 1.06* 1.17*  CALCIUM 9.2   < > 9.4   < > 9.4   < > 9.3   < > 8.8* 8.9 8.5*  MG 1.8   < > 1.8  --  1.8  --  1.7  --   --  1.4* 2.2  PHOS 3.6  --  3.9  --  3.6  --  2.2*  --   --   --   --    < > = values in this interval not displayed.    Liver Function Tests: No results for input(s): AST, ALT, ALKPHOS, BILITOT, PROT, ALBUMIN in the last 168 hours. No results for input(s): LIPASE, AMYLASE in the last 168 hours. No results for input(s): AMMONIA in the last 168 hours.  CBC: Recent Labs  Lab 10/06/19 0439 10/06/19 0439 10/07/19 0446 10/08/19 0352 10/08/19 0648 10/09/19 0428 10/09/19 1439  WBC 10.7*  --  9.5 16.1* 17.7* 16.8*  --   HGB 10.9*   < > 11.1* 9.5* 8.7* 7.3* 9.1*  HCT 35.4*   < > 35.8* 30.2* 27.4* 22.9* 28.6*  MCV 100.9*  --  100.3* 101.0* 100.4* 97.9  --   PLT 357  --  362 380 349 239  --    < > = values in this interval not displayed.    Cardiac Enzymes: No results for input(s): CKTOTAL, CKMB, CKMBINDEX, TROPONINI in the last 168 hours.  BNP: BNP (last 3 results) No results for input(s): BNP in the last 8760 hours.  ProBNP (last 3 results) No results for input(s): PROBNP in the last 8760 hours.    Other  results:  Imaging: MR BRAIN WO CONTRAST  Result Date: 10/09/2019 CLINICAL DATA:  69 year old female, code stroke presentation on 09/30/2019. Posterior right hemisphere cytotoxic edema evident by CT the next day. EXAM: MRI HEAD WITHOUT CONTRAST TECHNIQUE: Multiplanar, multiecho pulse sequences of the brain and surrounding structures were obtained without intravenous contrast. COMPARISON:  CT head 10/01/2019, CTA and CTP 09/30/2019 FINDINGS: The examination had to be discontinued prior to completion due to patient inability to tolerate. Axial and coronal DWI imaging only was obtained. There is a large 6 cm area of confluent restricted diffusion in the right parietal lobe, with contiguous additional restricted diffusion in the deep right MCA territory white matter (series 3, image 38). Much of this corresponds to abnormal perfusion T-max on the comparison. Additionally there is confluent diffusion restriction in the splenium of the corpus callosum on series 3, image 32. There is also abnormal diffusion in the contralateral left MCA periventricular white matter (also on image 38) although affecting much smaller area. Furthermore, there is probably artifactual signal in the occipital lobes on series 3, which is not correlated on coronal diffusion series 4 (series 3, image 28 versus series 4, image 9). No midline shift or significant intracranial mass effect. Basilar cisterns appear patent. No definite ventriculomegaly. IMPRESSION: 1. Exam limited to DWI secondary to patient inability to tolerate. 2. Large infarct in the posterior Right MCA territory, including confluent ischemia in the deep Right MCA white matter, and the splenium of the corpus callosum. Aside from the splenium, these areas were abnormal on CTP last week. 3. But there is also evidence of patchy infarcts in the deep Left MCA white matter, not evident last week. 4. No significant intracranial mass effect. Electronically Signed   By: Genevie Ann M.D.   On:  10/09/2019 16:08   DG Chest Port 1 View  Result Date: 10/08/2019 CLINICAL DATA:  Pain EXAM: PORTABLE CHEST 1 VIEW COMPARISON:  September 26, 2019 FINDINGS: Lungs are clear. Heart is upper normal in size with pulmonary vascularity normal. No adenopathy. No pneumothorax. There is aortic atherosclerosis. There is degenerative change in the thoracic spine. IMPRESSION:  Lungs clear. Heart upper normal in size. Aortic Atherosclerosis (ICD10-I70.0). Electronically Signed   By: Bretta Bang III M.D.   On: 10/08/2019 13:26   DG Abd Portable 1V  Result Date: 10/08/2019 CLINICAL DATA:  Abdominal pain EXAM: PORTABLE ABDOMEN - 1 VIEW COMPARISON:  October 01, 2019 FINDINGS: Mild stool in colon. No bowel dilatation or air-fluid level to suggest bowel obstruction. No free air evident on supine examination. Visualized lung bases clear. Surgical clips noted in right upper quadrant. IMPRESSION: No bowel obstruction or free air evident on supine examination. Electronically Signed   By: Bretta Bang III M.D.   On: 10/08/2019 13:26     Medications:     Scheduled Medications: . sodium chloride   Intravenous Once  . amantadine  100 mg Oral BID  . atorvastatin  80 mg Oral q1800  . chlorhexidine  15 mL Mouth Rinse BID  . clopidogrel  75 mg Oral Q breakfast  . feeding supplement  1 Container Oral BID BM  . insulin aspart  0-15 Units Subcutaneous TID WC  . mouth rinse  15 mL Mouth Rinse q12n4p  . sodium chloride flush  3 mL Intravenous Q12H    Infusions: . sodium chloride Stopped (10/08/19 1620)  . pantoprozole (PROTONIX) infusion Stopped (10/09/19 1500)    PRN Medications: sodium chloride, acetaminophen **OR** acetaminophen, metoprolol tartrate, nitroGLYCERIN, ondansetron (ZOFRAN) IV, Resource ThickenUp Clear   Assessment/Plan:   1. CAD with NSTEMI - cath with severe diffuse 3v CAD particularly on left side with severe LV dysfunction. Ideally will need CABG, however in looking at her films worry about  the quality of her LAD target and also not sure she would benefit much from a graft to the RCA. Concerned about her mobility especially after recent fall an groin hematoma and low albumin - s/p PCI of pLAD w/ DES x 1 + PCI of mLCX w/ DES x 1 - CMRI- LVEF 29% RV 37% Global hypokinesis.  - 2D echo 10/02/19 showed improved EF, up to 50-55% - No chest pain. - continue Plavix and statin. Hold aspirin with GI bleed. Discuss interventional team.  Now able to take po so getting Plavix crushed in applesauce  2. Acute systolic HF due to NICM - Initial EF ~25% - repeat echo 3/31, EF normalized, 50-55% - can plan to resume arni and bb soon now that BP more stable   3. R groin hematoma after fall - has hematoma at cath site.  - u/s negative for PSA - CT + hematoma. No mention of fracture but not completely imaged. - Hgb has been trending down 11>9.5>8.7>>7.3 (GIB). Transfused. Hgb 9.1 - Hold lovenox   4. Hypokalemia/Hypomag - K 4.3 - Mag 2.2    5. Severe protein calorie malnutrition  - albumin 2.3  6.  Physical deconditioning - PT/OT/Speech following   7 . Stroke, Acute  -CT 3/28 No acute findings  -CT 3/29 at 0500 R basal infact, small r pontine infact.  -CT 3/30 - Acute infarct centered in the right parietal lobe correlating with perfusion defects yesterday.2. Age-indeterminate infarct at the right caudate that is stable from yesterday. - Improving. Neuro appreciated.  - PT/OT/Speech and CIR following.  - Hopefully can transfer to CIR soon. - Now on D1 diet  8. Hypernatremia - Na 148 today .   9. NSVT -K 4.3  - Mag 2.2  - Hold bb for now with bleed.   10. Anemia, GI bleed.  H/O gastric ulcers years ago per son.  +  FOBT  -Hgb down 11>9.5>8.7>>7.3 - S/p transfusion x 2 units on 4/6. Hgb up to 9.1 today - Urgent EGD 4/6 showed an actively bleeding duodenal bulb ulcer with visible vessel, treated with epi and hemostasis clips x 5. Hemostasis achieved  - Continue IV Protonix -  ASA discontinued. Remains on Plavix for recently placed coronary DES - Monitor H/H   Length of Stay: 12   Brittainy Delmer Islam  10/09/2019, 4:32 PM  Advanced Heart Failure Team Pager [phone] (M-F; 7a - 4p)  Please contact [facility] for night-coverage after hours (4p -7a ) and weekends on [email or web address]  Patient seen and examined with the above-signed Advanced Practice Provider and/or Housestaff. I personally reviewed laboratory data, imaging studies and relevant notes. I independently examined the patient and formulated the important aspects of the plan. I have edited the note to reflect any of my changes or salient points. I have personally discussed the plan with the patient and/or family.  Underwent EGD yesterday with clipping of large ulcer. No further melena but hgb down this am and received another unit of RBCs. Remains on protonix gtt. Denies CP or SOB. Remains on Plavix. ASA off. More talkative today but remains dysarthric. Stood with PT. LUE remains flaccid. LLE weak.   Brain MRI reviewed with Neuro and has large R MCA stroke and small left MCA CVA  General:  Lying in bed. No resp difficulty HEENT: normal + left facial drrop Neck: supple. no JVD. Carotids 2+ bilat; no bruits. No lymphadenopathy or thryomegaly appreciated. Cor: PMI nondisplaced. Regular rate & rhythm. No rubs, gallops or murmurs. Lungs: clear Abdomen: soft, nontender, nondistended. No hepatosplenomegaly. No bruits or masses. Good bowel sounds. Extremities: no cyanosis, clubbing, rash, edema + r groin ecchymosis Neuro: alert & orientedx3, dysarthric Flaccid LUE. Weak LLE   She is stabilizing after large GI bleed. Ulcer now clipped Have transfused 2u RBCs. D/w GI and TRH,. Need to continue Plavix. Restart cardiac meds as tolerated, Still with dense R MCA stroke. I reviewed MRI with Neuro at bedside. Suspect she will have protracted recovery.   Arvilla Meres, MD  10:56 PM

## 2019-10-09 NOTE — Progress Notes (Signed)
Occupational Therapy Treatment Patient Details Name: Brittney Kennedy MRN: [id number] DOB: 03-26-1951 Today's Date: 10/09/2019    History of present illness 69 year old female to the hospital for nausea, vomiting, epigastric pain x 1 week, admitted 09/27/19 for NSTEMI. cath 3/26 revealed severe multivessel CAD. Patient transferred to [facility]. Fall 3/27 with R groin hematoma. 3/28 patient with AMS due to pain meds and rapid response 3/29 due to AMS and L facial droop with weakness. CT: R basal ganglia infarct. PMH: CAD, cholecystectomy   OT comments  Pt completed oob to chair this session with total (A) blocking LLE total +2 max (A) with pivot only. Recommend RN staff hoyer lift back or sara stedy total +2 max (A). Pt lacks awareness to L side deficits and needs encouragement for L visual scanning. Pt up in chair helps promote patient to rotate neck L to see whom might enter the room. Consider use of the medium givmohr sling for L UE if progressing to transfer more than pivot next session,   Follow Up Recommendations  CIR;Supervision/Assistance - 24 hour    Equipment Recommendations  Wheelchair (measurements OT);Wheelchair cushion (measurements OT);Hospital bed    Recommendations for Other Services      Precautions / Restrictions Precautions Precautions: Fall;Other (comment) Precaution Comments: aspiration precautions (dysphagia 1 with nectar), HOB >30 degrees Restrictions Weight Bearing Restrictions: No       Mobility Bed Mobility Overal bed mobility: Needs Assistance Bed Mobility: Supine to Sit     Supine to sit: Total assist;+2 for safety/equipment;+2 for physical assistance;HOB elevated     General bed mobility comments: Total +2 for LE management, scooting to EOB with use of bed pad, and trunk elevation. Special attention to LUE/LLE due to hemiparesis.. Pt lateral lean onto L UE for weight bearing input and work on core activation for return to sitting  Transfers Overall transfer  level: Needs assistance Equipment used: 2 person hand held assist Transfers: Sit to/from [employer] Sit to Stand: Max assist;+2 physical assistance;+2 safety/equipment;From elevated surface Stand pivot transfers: Max assist;+2 physical assistance;+2 safety/equipment;From elevated surface       General transfer comment: Sit to stand with PT/OT HHA x2, max assist for power up, hip extension to neutral, steadying, and LLE blocking due to paresis. Second stand attempt more successful with more hip facilitation from PT and truncal/RUE support from OT. Pt with pivot on RLE to reach recliner, with max +2 for steadying, guiding pt trajectory to recliner, LLE blocking in stance and facilitation during LLE translation, and slow eccentric lowering into recliner.    Balance Overall balance assessment: Needs assistance Sitting-balance support: Single extremity supported;Feet supported Sitting balance-Leahy Scale: Fair Sitting balance - Comments: fair to poor - able to sit unassisted for up to 1 minute with use of RUE on bedrail, heavy posterior and L lateral leaning without external support. Pt sat EOB x10 minutes, working on sitting balance, LUE propping, finding midline, and working on crossing midline to L with cues with ice chips. Postural control: Posterior lean;Left lateral lean Standing balance support: Bilateral upper extremity supported Standing balance-Leahy Scale: Zero Standing balance comment: max +2 for transfer to recliner                           ADL either performed or assessed with clinical judgement   ADL Overall ADL's : Needs assistance/impaired Eating/Feeding: Minimal assistance;Sitting Eating/Feeding Details (indicate cue type and reason): pt motioning to simulate use of a spoon  to further encourage staff to give her ice chips Grooming: Moderate assistance;Sitting Grooming Details (indicate cue type and reason): wash face. pt terminates task prematurely  and perseverating on ice chips again             Lower Body Dressing: Total assistance   Toilet Transfer: +2 for physical assistance;Maximal assistance;Stand-pivot Toilet Transfer Details (indicate cue type and reason): simulated OOB to chair           General ADL Comments: pt unable to progress past stand pivot to chair with total (A) to block L LE. pt reliant on staff for transfer     Vision   Vision Assessment?: Yes Ocular Range of Motion: Restricted on the left Alignment/Gaze Preference: Gaze right Additional Comments: pt will track to midline but not past it   Perception     Praxis      Cognition Arousal/Alertness: Awake/alert(drowsy, in and out of true alertness) Behavior During Therapy: Flat affect Overall Cognitive Status: Difficult to assess Area of Impairment: Attention                   Current Attention Level: Sustained           General Comments: A&O to self, location. Pt aware of LUE and LLE weakness today. Pt perseverating on ice chips, incorporated to work on L inattention vs neglect.        Exercises General Exercises - Lower Extremity Ankle Circles/Pumps: AROM;Both;10 reps;Seated Other Exercises Other Exercises: positioning in recliner with LUE supported, bilateral heels floating, and pillows wedged behind R shoulder to keep pt in midline of chair   Shoulder Instructions       General Comments VSS, recieving IV blood at this time with no symptoms, mittens applied     Pertinent Vitals/ Pain       Pain Assessment: Faces Faces Pain Scale: Hurts little more Pain Location: RLE secondary to hematoma Pain Descriptors / Indicators: Grimacing;Discomfort Pain Intervention(s): Limited activity within patient's tolerance  Home Living                                          Prior Functioning/Environment              Frequency  Min 2X/week        Progress Toward Goals  OT Goals(current goals can now be  found in the care plan section)  Progress towards OT goals: Progressing toward goals  Acute Rehab OT Goals Patient Stated Goal: get some ice OT Goal Formulation: With patient/family Time For Goal Achievement: 10/14/19 Potential to Achieve Goals: Good ADL Goals Pt Will Perform Grooming: with min assist;sitting Pt Will Perform Upper Body Bathing: with min assist;sitting Pt Will Transfer to Toilet: with min assist;bedside commode;stand pivot transfer Pt/caregiver will Perform Home Exercise Program: Left upper extremity;With written HEP provided;Increased strength Additional ADL Goal #1: Pt will scan and locate items on L side of tray table with no more than min verbal cueing.  Plan Discharge plan remains appropriate    Co-evaluation    PT/OT/SLP Co-Evaluation/Treatment: Yes Reason for Co-Treatment: For patient/therapist safety;To address functional/ADL transfers PT goals addressed during session: Mobility/safety with mobility;Balance OT goals addressed during session: ADL's and self-care;Strengthening/ROM      AM-PAC OT "6 Clicks" Daily Activity     Outcome Measure   Help from another person eating meals?: A Lot Help from another person taking  care of personal grooming?: A Lot Help from another person toileting, which includes using toliet, bedpan, or urinal?: A Lot Help from another person bathing (including washing, rinsing, drying)?: A Lot Help from another person to put on and taking off regular upper body clothing?: A Lot Help from another person to put on and taking off regular lower body clothing?: A Lot 6 Click Score: 12    End of Session Equipment Utilized During Treatment: Gait belt  OT Visit Diagnosis: Other abnormalities of gait and mobility (R26.89);Muscle weakness (generalized) (M62.81);History of falling (Z91.81);Cognitive communication deficit (R41.841);Other symptoms and signs involving the nervous system (R29.898);Low vision, both eyes (H54.2) Symptoms and  signs involving cognitive functions: Cerebral infarction   Activity Tolerance Patient tolerated treatment well   Patient Left in chair;with call bell/phone within reach;with chair alarm set;with family/visitor present   Nurse Communication Mobility status;Precautions        Time: 1005-1031 OT Time Calculation (min): 26 min  Charges: OT General Charges $OT Visit: 1 Visit OT Treatments $Self Care/Home Management : 8-22 mins   Brynn, OTR/L  Acute Rehabilitation Services Pager: [phone] Office: [phone] .    Mateo Flow 10/09/2019, 11:44 AM

## 2019-10-10 ENCOUNTER — Encounter (HOSPITAL_COMMUNITY): Payer: Self-pay | Admitting: *Deleted

## 2019-10-10 ENCOUNTER — Inpatient Hospital Stay (HOSPITAL_COMMUNITY): Payer: Medicare Other

## 2019-10-10 DIAGNOSIS — K269 Duodenal ulcer, unspecified as acute or chronic, without hemorrhage or perforation: Secondary | ICD-10-CM

## 2019-10-10 DIAGNOSIS — I6601 Occlusion and stenosis of right middle cerebral artery: Secondary | ICD-10-CM

## 2019-10-10 DIAGNOSIS — E870 Hyperosmolality and hypernatremia: Secondary | ICD-10-CM

## 2019-10-10 DIAGNOSIS — K279 Peptic ulcer, site unspecified, unspecified as acute or chronic, without hemorrhage or perforation: Secondary | ICD-10-CM

## 2019-10-10 LAB — CBC
HCT: 23.5 % — ABNORMAL LOW (ref 36.0–46.0)
HCT: 26 % — ABNORMAL LOW (ref 36.0–46.0)
HCT: 27.8 % — ABNORMAL LOW (ref 36.0–46.0)
Hemoglobin: 6.9 g/dL — CL (ref 12.0–15.0)
Hemoglobin: 8 g/dL — ABNORMAL LOW (ref 12.0–15.0)
Hemoglobin: 8.7 g/dL — ABNORMAL LOW (ref 12.0–15.0)
MCH: 30.9 pg (ref 26.0–34.0)
MCH: 29.7 pg (ref 26.0–34.0)
MCH: 30.9 pg (ref 26.0–34.0)
MCHC: 29.4 g/dL — ABNORMAL LOW (ref 30.0–36.0)
MCHC: 30.8 g/dL (ref 30.0–36.0)
MCHC: 31.3 g/dL (ref 30.0–36.0)
MCV: 105.4 fL — ABNORMAL HIGH (ref 80.0–100.0)
MCV: 96.7 fL (ref 80.0–100.0)
MCV: 98.6 fL (ref 80.0–100.0)
Platelets: 170 10*3/uL (ref 150–400)
Platelets: 208 10*3/uL (ref 150–400)
Platelets: 209 10*3/uL (ref 150–400)
RBC: 2.23 MIL/uL — ABNORMAL LOW (ref 3.87–5.11)
RBC: 2.69 MIL/uL — ABNORMAL LOW (ref 3.87–5.11)
RBC: 2.82 MIL/uL — ABNORMAL LOW (ref 3.87–5.11)
RDW: 20.4 % — ABNORMAL HIGH (ref 11.5–15.5)
RDW: 19.2 % — ABNORMAL HIGH (ref 11.5–15.5)
RDW: 18.9 % — ABNORMAL HIGH (ref 11.5–15.5)
WBC: 11 10*3/uL — ABNORMAL HIGH (ref 4.0–10.5)
WBC: 14.3 10*3/uL — ABNORMAL HIGH (ref 4.0–10.5)
WBC: 13.3 10*3/uL — ABNORMAL HIGH (ref 4.0–10.5)
nRBC: 0.7 % — ABNORMAL HIGH (ref 0.0–0.2)
nRBC: 0.5 % — ABNORMAL HIGH (ref 0.0–0.2)
nRBC: 0.6 % — ABNORMAL HIGH (ref 0.0–0.2)

## 2019-10-10 LAB — BASIC METABOLIC PANEL
Anion gap: 10 (ref 5–15)
BUN: 33 mg/dL — ABNORMAL HIGH (ref 8–23)
CO2: 23 mmol/L (ref 22–32)
Calcium: 8.6 mg/dL — ABNORMAL LOW (ref 8.9–10.3)
Chloride: 114 mmol/L — ABNORMAL HIGH (ref 98–111)
Creatinine, Ser: 1.12 mg/dL — ABNORMAL HIGH (ref 0.44–1.00)
GFR calc Af Amer: 59 mL/min — ABNORMAL LOW (ref >60)
GFR calc non Af Amer: 51 mL/min — ABNORMAL LOW (ref >60)
Glucose, Bld: 118 mg/dL — ABNORMAL HIGH (ref 70–99)
Potassium: 3.6 mmol/L (ref 3.5–5.1)
Sodium: 147 mmol/L — ABNORMAL HIGH (ref 135–145)

## 2019-10-10 LAB — MAGNESIUM: Magnesium: 1.9 mg/dL (ref 1.7–2.4)

## 2019-10-10 LAB — GLUCOSE, CAPILLARY
Glucose-Capillary: 88 mg/dL (ref 70–99)
Glucose-Capillary: 128 mg/dL — ABNORMAL HIGH (ref 70–99)
Glucose-Capillary: 111 mg/dL — ABNORMAL HIGH (ref 70–99)
Glucose-Capillary: 102 mg/dL — ABNORMAL HIGH (ref 70–99)

## 2019-10-10 LAB — H. PYLORI ANTIBODY, IGG: H Pylori IgG: 0.18 Index Value (ref 0.00–0.79)

## 2019-10-10 MED ORDER — DEXTROSE 5 % IV SOLN: INTRAVENOUS | Status: DC

## 2019-10-10 MED ORDER — POTASSIUM CHLORIDE CRYS ER 20 MEQ PO TBCR
40.0000 meq | EXTENDED_RELEASE_TABLET | Freq: Once | ORAL | Status: AC
  Administered 2019-10-10: 40 meq via ORAL
  Filled 2019-10-10: qty 2

## 2019-10-10 NOTE — Progress Notes (Signed)
Chaplain called by nurse to notarize AD.  No volunteers available so unable to accomplish notary. Because this family has been trying unsuccessfully for many days to get the AD accomplished, Chaplain visited with the son to explain the difficulties and barriers to getting an AD notarized; to express apologies for the time it is taking  And to assure son it is not from lack of care and concern for his mom.    Son kindly expressed his frustration with the AD process and some other situations surrounding his mother's hospitalization, especially not being notified by the staff when there have been significant changes in his mother's health.  Chaplain acknowledge son's frustration and empathized with the difficulties that are sometimes conducive to a stressful situation such as being in the hospital.  Chaplain and son will meet at 8:30 tomorrow while the day is young and hopefully there will be available volunteers to witness his mother signing the AD.  Chaplain spoke briefly with the patient and her friend who was at her bedside.  Patient spoke lovingly of her son and recounted what a good son he is.  Chaplain ventured a guess that is because he had a good mom who taught him well.  Son is appropriately concerned about his mother and treats her in a loving manner.    Chaplain bid family a restful night with a promise to return in the morning.  Vernell Morgans Chaplain Resident

## 2019-10-10 NOTE — Progress Notes (Addendum)
Advanced Heart Failure Rounding Note   Subjective:    Events  - cath 09/27/19 3v CAD. EF 20% - right groin hematoma. U/s 3/27 negative for PSA - 3/28 altered mental status due to pain meds. Head CT, ABG and ammonia normal - 09/30/19 CVA, ACS with DES LAD and mid CX - 3/30 Headache with stat CT. EEG- no seizure, cortical dysfunction in the right hemisphere likely secondary to underlying infarct as well as moderate to severe diffuse encephalopathy, nonspecific to etiology. - 3/30 Echo EF 50-55% - 4/6 developed GIB. Hgb 7.3. Transfused x 2 units. Urgent EGD 4/6 showed an actively bleeding duodenal bulb ulcer with visible vessel, treated with epi and hemostasis clips x 5. Hemostasis achieved  -4/7 Got another unit of RBCs today,. Hgb up to 9.1   Labs redrawn this morning --pending.   No melena over night.   Denies SOB. Denies pain.    Objective:   Weight Range:  Vital Signs:   Temp:  [97.6 F (36.4 C)-98.8 F (37.1 C)] 98.2 F (36.8 C) (04/08 0509) Pulse Rate:  [79-87] 79 (04/08 0509) Resp:  [16-20] 16 (04/08 0509) BP: (115-154)/(31-78) 152/60 (04/08 0509) SpO2:  [57 %-100 %] 57 % (04/08 0509) Last BM Date: 10/09/19  Weight change: Filed Weights   10/06/19 0528 10/07/19 0431 10/09/19 0500  Weight: 72.6 kg 74.1 kg 72.3 kg    Intake/Output:   Intake/Output Summary (Last 24 hours) at 10/10/2019 0713 Last data filed at 10/10/2019 0600 Gross per 24 hour  Intake 841.03 ml  Output 750 ml  Net 91.03 ml    PHYSICAL EXAM: General:  In bed. Pale.  No resp difficulty HEENT: Left facial droop  Neck: supple. JVP 6-7 . Carotids 2+ bilat; no bruits. No lymphadenopathy or thryomegaly appreciated. Cor: PMI nondisplaced. Regular rate & rhythm. No rubs, gallops or murmurs. Lungs: clear Abdomen: soft, nontender, nondistended. No hepatosplenomegaly. No bruits or masses. Good bowel sounds. Extremities: no cyanosis, clubbing, rash, LUE faccid. R groin ecchymotic Neuro: aphasic, cranial  nerves grossly intact. LUE flaccid.   Telemetry: NSR 70-80s  occasional PVCs  Labs: Basic Metabolic Panel: Recent Labs  Lab 10/04/19 0400 10/04/19 0400 10/05/19 0350 10/05/19 0350 10/06/19 0439 10/06/19 0439 10/07/19 0446 10/08/19 0319 10/09/19 0428  NA 150*   < > 153*  --  148*  --  145 144 148*  K 4.3   < > 3.8  --  3.5  --  3.3* 4.3 4.3  CL 115*   < > 116*  --  113*  --  110 110 117*  CO2 21*   < > 23  --  24  --  23 22 22   GLUCOSE 109*   < > 128*  --  123*  --  115* 178* 143*  BUN 18   < > 17  --  14  --  13 49* 50*  CREATININE 1.17*   < > 1.14*  --  1.01*  --  0.90 1.06* 1.17*  CALCIUM 9.4   < > 9.4   < > 9.3   < > 8.8* 8.9 8.5*  MG 1.8  --  1.8  --  1.7  --   --  1.4* 2.2  PHOS 3.9  --  3.6  --  2.2*  --   --   --   --    < > = values in this interval not displayed.    Liver Function Tests: No results for input(s): AST, ALT, ALKPHOS, BILITOT,  PROT, ALBUMIN in the last 168 hours. No results for input(s): LIPASE, AMYLASE in the last 168 hours. No results for input(s): AMMONIA in the last 168 hours.  CBC: Recent Labs  Lab 10/08/19 0352 10/08/19 0352 10/08/19 0648 10/09/19 0428 10/09/19 1439 10/09/19 2101 10/10/19 0408  WBC 16.1*  --  17.7* 16.8*  --  17.4* 11.0*  HGB 9.5*   < > 8.7* 7.3* 9.1* 9.4* 6.9*  HCT 30.2*   < > 27.4* 22.9* 28.6* 30.0* 23.5*  MCV 101.0*  --  100.4* 97.9  --  96.2 105.4*  PLT 380  --  349 239  --  197 170   < > = values in this interval not displayed.    Cardiac Enzymes: No results for input(s): CKTOTAL, CKMB, CKMBINDEX, TROPONINI in the last 168 hours.  BNP: BNP (last 3 results) No results for input(s): BNP in the last 8760 hours.  ProBNP (last 3 results) No results for input(s): PROBNP in the last 8760 hours.    Other results:  Imaging: MR BRAIN WO CONTRAST  Addendum Date: 10/09/2019   ADDENDUM REPORT: 10/09/2019 17:24 ADDENDUM: Study discussed by telephone with Dr. Marvel Plan on 10/09/2019 at 1715 hours. We discussed the  heterogeneous appearance of the brainstem and cerebellum on axial DWI. But I favor that is artifact, with no convincing infratentorial diffusion abnormality on the coronal images. However, both sets of images do suggest a small area of susceptibility in the central lower pons likely from chronic microhemorrhage (series 3, image 18 and series 4, image 19). Electronically Signed   By: Odessa Fleming M.D.   On: 10/09/2019 17:24   Result Date: 10/09/2019 CLINICAL DATA:  69 year old female, code stroke presentation on 09/30/2019. Posterior right hemisphere cytotoxic edema evident by CT the next day. EXAM: MRI HEAD WITHOUT CONTRAST TECHNIQUE: Multiplanar, multiecho pulse sequences of the brain and surrounding structures were obtained without intravenous contrast. COMPARISON:  CT head 10/01/2019, CTA and CTP 09/30/2019 FINDINGS: The examination had to be discontinued prior to completion due to patient inability to tolerate. Axial and coronal DWI imaging only was obtained. There is a large 6 cm area of confluent restricted diffusion in the right parietal lobe, with contiguous additional restricted diffusion in the deep right MCA territory white matter (series 3, image 38). Much of this corresponds to abnormal perfusion T-max on the comparison. Additionally there is confluent diffusion restriction in the splenium of the corpus callosum on series 3, image 32. There is also abnormal diffusion in the contralateral left MCA periventricular white matter (also on image 38) although affecting much smaller area. Furthermore, there is probably artifactual signal in the occipital lobes on series 3, which is not correlated on coronal diffusion series 4 (series 3, image 28 versus series 4, image 9). No midline shift or significant intracranial mass effect. Basilar cisterns appear patent. No definite ventriculomegaly. IMPRESSION: 1. Exam limited to DWI secondary to patient inability to tolerate. 2. Large infarct in the posterior Right MCA  territory, including confluent ischemia in the deep Right MCA white matter, and the splenium of the corpus callosum. Aside from the splenium, these areas were abnormal on CTP last week. 3. But there is also evidence of patchy infarcts in the deep Left MCA white matter, not evident last week. 4. No significant intracranial mass effect. Electronically Signed: By: Odessa Fleming M.D. On: 10/09/2019 16:08   DG Chest Port 1 View  Result Date: 10/08/2019 CLINICAL DATA:  Pain EXAM: PORTABLE CHEST 1 VIEW COMPARISON:  September 26, 2019 FINDINGS: Lungs are clear. Heart is upper normal in size with pulmonary vascularity normal. No adenopathy. No pneumothorax. There is aortic atherosclerosis. There is degenerative change in the thoracic spine. IMPRESSION: Lungs clear. Heart upper normal in size. Aortic Atherosclerosis (ICD10-I70.0). Electronically Signed   By: Lowella Grip III M.D.   On: 10/08/2019 13:26   DG Abd Portable 1V  Result Date: 10/08/2019 CLINICAL DATA:  Abdominal pain EXAM: PORTABLE ABDOMEN - 1 VIEW COMPARISON:  October 01, 2019 FINDINGS: Mild stool in colon. No bowel dilatation or air-fluid level to suggest bowel obstruction. No free air evident on supine examination. Visualized lung bases clear. Surgical clips noted in right upper quadrant. IMPRESSION: No bowel obstruction or free air evident on supine examination. Electronically Signed   By: Lowella Grip III M.D.   On: 10/08/2019 13:26     Medications:     Scheduled Medications: . amantadine  100 mg Oral BID  . atorvastatin  80 mg Oral q1800  . chlorhexidine  15 mL Mouth Rinse BID  . clopidogrel  75 mg Oral Q breakfast  . feeding supplement  1 Container Oral BID BM  . insulin aspart  0-15 Units Subcutaneous TID WC  . mouth rinse  15 mL Mouth Rinse q12n4p  . sodium chloride flush  3 mL Intravenous Q12H    Infusions: . sodium chloride Stopped (10/08/19 1620)  . dextrose 50 mL/hr at 10/10/19 0200  . pantoprozole (PROTONIX) infusion 8 mg/hr  (10/10/19 0200)    PRN Medications: sodium chloride, acetaminophen **OR** acetaminophen, metoprolol tartrate, nitroGLYCERIN, ondansetron (ZOFRAN) IV, Resource ThickenUp Clear   Assessment/Plan:   1. CAD with NSTEMI - cath with severe diffuse 3v CAD particularly on left side with severe LV dysfunction. Ideally will need CABG, however in looking at her films worry about the quality of her LAD target and also not sure she would benefit much from a graft to the RCA. Concerned about her mobility especially after recent fall an groin hematoma and low albumin - s/p PCI of pLAD w/ DES x 1 + PCI of mLCX w/ DES x 1 - CMRI- LVEF 29% RV 37% Global hypokinesis.  - 2D echo 10/02/19 showed improved EF, up to 50-55% - No chest pain.  - continue Plavix and statin. Hold aspirin with GI bleed. Discuss interventional team.  Now able to take po so getting Plavix crushed in applesauce  2. Acute systolic HF due to NICM - Initial EF ~25% - repeat echo 3/31, EF normalized, 50-55% - Restart entresto today if renal function stable.  - Add bb soon now that BP more stable   3. R groin hematoma after fall - has hematoma at cath site.  - u/s negative for PSA - CT + hematoma. No mention of fracture but not completely imaged. - Urgent EGD 4/6 showed an actively bleeding duodenal bulb ulcer with visible vessel, treated with epi and hemostasis clips x 5. Hemostasis achieved  - Hold lovenox   4. Hypokalemia/Hypomag - K /Mag pending.   5. Severe protein calorie malnutrition  - albumin 2.3  6.  Physical deconditioning - PT/OT/Speech following   7 . Stroke, Acute  -CT 3/28 No acute findings  -CT 3/29 at 0500 R basal infact, small r pontine infact.  -CT 3/30 - Acute infarct centered in the right parietal lobe correlating with perfusion defects yesterday.2. Age-indeterminate infarct at the right caudate that is stable from yesterday. - Improving. Neuro appreciated.  - PT/OT/Speech and CIR following.  - Now on  D1 diet  8. Hypernatremia -Labs peind.g   9. NSVT -Labs pending.  - Hold bb for now with bleed.   10. Anemia, GI bleed.  H/O gastric ulcers years ago per son.  +FOBT  - S/p transfusion x 2 units on 4/6. Hgb up to 9.1 today - Urgent EGD 4/6 showed an actively bleeding duodenal bulb ulcer with visible vessel, treated with epi and hemostasis clips x 5. Hemostasis achieved  - Receiving protonix drip.  - ASA discontinued. Remains on Plavix for recently placed coronary DES - Monitor H/H  -CBC this morning has been repeated--due to lab error results pending. No bleeding over night.   Length of Stay: 13   Amy Clegg NP-C  10/10/2019, 7:13 AM  Advanced Heart Failure Team Pager [phone] (M-F; 7a - 4p)  Please contact [facility] for night-coverage after hours (4p -7a ) and weekends on [email or web address]  Patient seen and examined with the above-signed Advanced Practice Provider and/or Housestaff. I personally reviewed laboratory data, imaging studies and relevant notes. I independently examined the patient and formulated the important aspects of the plan. I have edited the note to reflect any of my changes or salient points. I have personally discussed the plan with the patient and/or family.  She is more alert today. Was able to stand with PT. Moving left side more.  No CP or SOb. No further melena. hgb down a bit. Now 8.7.   Neuro wants to maintain SBP 130-150 with R MCA stenosis.   Continue Plavix and statin. Would avoid entresto given low BP. Add back low dose b-blocker first as BP tolerates.   Arvilla Meres, MD  7:44 PM

## 2019-10-10 NOTE — Progress Notes (Signed)
FMLA forms for pt's son, Loleta Books, completed and faxed to [employer] at [phone]. He is aware this has been done, copy taken up to him in pt's room.

## 2019-10-10 NOTE — Progress Notes (Signed)
Progress Note   Subjective  Patient resting in bed. Per nursing she has had no bloody output per rectum overnight. Passed a very small smear of old blood yesterday. Hgb fluctuating on lab draws last 24 hours   Objective   Vital signs in last 24 hours: Temp:  [97.6 F (36.4 C)-98.4 F (36.9 C)] 97.9 F (36.6 C) (04/08 0746) Pulse Rate:  [79-86] 81 (04/08 0746) Resp:  [16-18] 16 (04/08 0746) BP: (115-152)/(31-78) 144/53 (04/08 0746) SpO2:  [57 %-100 %] 100 % (04/08 0746) Last BM Date: 10/09/19 General:    white female in NAD, resting in bed Abdomen:  Soft, nontender and nondistended.    Intake/Output from previous day: 04/07 0701 - 04/08 0700 In: 841 [I.V.:493.1; Blood:347.9] Out: 750 [Urine:750] Intake/Output this shift: Total I/O In: 437.3 [P.O.:30; I.V.:407.3] Out: 125 [Urine:125]  Lab Results: Recent Labs    10/09/19 2101 10/10/19 0408 10/10/19 0811  WBC 17.4* 11.0* 14.3*  HGB 9.4* 6.9* 8.0*  HCT 30.0* 23.5* 26.0*  PLT 197 170 208   BMET Recent Labs    10/08/19 0319 10/09/19 0428 10/10/19 0811  NA 144 148* 147*  K 4.3 4.3 3.6  CL 110 117* 114*  CO2 22 22 23   GLUCOSE 178* 143* 118*  BUN 49* 50* 33*  CREATININE 1.06* 1.17* 1.12*  CALCIUM 8.9 8.5* 8.6*   LFT No results for input(s): PROT, ALBUMIN, AST, ALT, ALKPHOS, BILITOT, BILIDIR, IBILI in the last 72 hours. PT/INR No results for input(s): LABPROT, INR in the last 72 hours.  Studies/Results: MR BRAIN WO CONTRAST  Addendum Date: 10/09/2019   ADDENDUM REPORT: 10/09/2019 17:24 ADDENDUM: Study discussed by telephone with Dr. Rosalin Hawking on 10/09/2019 at 1715 hours. We discussed the heterogeneous appearance of the brainstem and cerebellum on axial DWI. But I favor that is artifact, with no convincing infratentorial diffusion abnormality on the coronal images. However, both sets of images do suggest a small area of susceptibility in the central lower pons likely from chronic microhemorrhage (series 3,  image 18 and series 4, image 19). Electronically Signed   By: Genevie Ann M.D.   On: 10/09/2019 17:24   Result Date: 10/09/2019 CLINICAL DATA:  69 year old female, code stroke presentation on 09/30/2019. Posterior right hemisphere cytotoxic edema evident by CT the next day. EXAM: MRI HEAD WITHOUT CONTRAST TECHNIQUE: Multiplanar, multiecho pulse sequences of the brain and surrounding structures were obtained without intravenous contrast. COMPARISON:  CT head 10/01/2019, CTA and CTP 09/30/2019 FINDINGS: The examination had to be discontinued prior to completion due to patient inability to tolerate. Axial and coronal DWI imaging only was obtained. There is a large 6 cm area of confluent restricted diffusion in the right parietal lobe, with contiguous additional restricted diffusion in the deep right MCA territory white matter (series 3, image 38). Much of this corresponds to abnormal perfusion T-max on the comparison. Additionally there is confluent diffusion restriction in the splenium of the corpus callosum on series 3, image 32. There is also abnormal diffusion in the contralateral left MCA periventricular white matter (also on image 38) although affecting much smaller area. Furthermore, there is probably artifactual signal in the occipital lobes on series 3, which is not correlated on coronal diffusion series 4 (series 3, image 28 versus series 4, image 9). No midline shift or significant intracranial mass effect. Basilar cisterns appear patent. No definite ventriculomegaly. IMPRESSION: 1. Exam limited to DWI secondary to patient inability to tolerate. 2. Large infarct in the posterior Right  MCA territory, including confluent ischemia in the deep Right MCA white matter, and the splenium of the corpus callosum. Aside from the splenium, these areas were abnormal on CTP last week. 3. But there is also evidence of patchy infarcts in the deep Left MCA white matter, not evident last week. 4. No significant intracranial mass  effect. Electronically Signed: By: Odessa Fleming M.D. On: 10/09/2019 16:08   DG Chest Port 1 View  Result Date: 10/08/2019 CLINICAL DATA:  Pain EXAM: PORTABLE CHEST 1 VIEW COMPARISON:  September 26, 2019 FINDINGS: Lungs are clear. Heart is upper normal in size with pulmonary vascularity normal. No adenopathy. No pneumothorax. There is aortic atherosclerosis. There is degenerative change in the thoracic spine. IMPRESSION: Lungs clear. Heart upper normal in size. Aortic Atherosclerosis (ICD10-I70.0). Electronically Signed   By: Bretta Bang III M.D.   On: 10/08/2019 13:26   DG Abd Portable 1V  Result Date: 10/08/2019 CLINICAL DATA:  Abdominal pain EXAM: PORTABLE ABDOMEN - 1 VIEW COMPARISON:  October 01, 2019 FINDINGS: Mild stool in colon. No bowel dilatation or air-fluid level to suggest bowel obstruction. No free air evident on supine examination. Visualized lung bases clear. Surgical clips noted in right upper quadrant. IMPRESSION: No bowel obstruction or free air evident on supine examination. Electronically Signed   By: Bretta Bang III M.D.   On: 10/08/2019 13:26       Assessment / Plan:   69 y/o female with history of NSTEMI s/p coronary stenting, CVA, who is on Plavix / aspirin and developed a severe upper GI bleed. Urgent EGD 4/6 showed an actively bleeding duodenal bulb ulcer with visible vessel, treated with epi and hemostasis clips x 5. Hemostasis achieved at the time.   Since the exam she has not had any significant overt bleeding. Had a small smear of old dark stool yesterday, nothing else overnight or today so far. Hgb has fluctuated from 7s after one unit to 9s (rose higher than expected) and now back to 8s - BUN has downtrended which is reassuring. If she is having any significant bleeding from the ulcer we should see some output from her bowel. Large hematoma noted on right hip as well.   Given we can't stop the Plavix she is at risk for recurrent bleeding but I don't think she has had  any significant bleeding since the procedure. Would continue IV protonix drip for total 72 hours from the EGD, and then 40mg  BID IV thereafter while inpatient. H pylori IgG remains pending, will treat if positive.   Would keep on full liquid diet today and monitor closely. Could repeat Hgb this afternoon to ensure stable. If she has any significant rebleeding would recommend IR embolization for treatment, hopefully that is not needed.   We will follow, please call with questions or changes in her status.  , MD [facility]

## 2019-10-10 NOTE — Progress Notes (Signed)
STROKE TEAM PROGRESS NOTE   INTERVAL HISTORY Son at bedside. Pt seems more awake alert and interactive. On full liquid diet now, Hb stable at 8.0. PT/OT recommend CIR. Pt left sided weakness improving.     Vitals:   10/09/19 2051 10/10/19 0040 10/10/19 0509 10/10/19 0746  BP: (!) 115/31 (!) 140/47 (!) 152/60 (!) 144/53  Pulse: 79 79 79 81  Resp: 16 18 16 16   Temp: 98 F (36.7 C) 97.6 F (36.4 C) 98.2 F (36.8 C) 97.9 F (36.6 C)  TempSrc: Oral Oral Oral Oral  SpO2: 100% 100% (!) 57% 100%  Weight:      Height:       CBC:  Recent Labs  Lab 10/10/19 0408 10/10/19 0811  WBC 11.0* 14.3*  HGB 6.9* 8.0*  HCT 23.5* 26.0*  MCV 105.4* 96.7  PLT 170 208   Basic Metabolic Panel:  Recent Labs  Lab 10/05/19 0350 10/05/19 0350 10/06/19 0439 10/07/19 0446 10/09/19 0428 10/10/19 0811  NA 153*   < > 148*   < > 148* 147*  K 3.8   < > 3.5   < > 4.3 3.6  CL 116*   < > 113*   < > 117* 114*  CO2 23   < > 24   < > 22 23  GLUCOSE 128*   < > 123*   < > 143* 118*  BUN 17   < > 14   < > 50* 33*  CREATININE 1.14*   < > 1.01*   < > 1.17* 1.12*  CALCIUM 9.4   < > 9.3   < > 8.5* 8.6*  MG 1.8   < > 1.7   < > 2.2 1.9  PHOS 3.6  --  2.2*  --   --   --    < > = values in this interval not displayed.   IMAGING past 24 hours MR BRAIN WO CONTRAST  Addendum Date: 10/09/2019   ADDENDUM REPORT: 10/09/2019 17:24 ADDENDUM: Study discussed by telephone with Dr. 12/09/2019 on 10/09/2019 at 1715 hours. We discussed the heterogeneous appearance of the brainstem and cerebellum on axial DWI. But I favor that is artifact, with no convincing infratentorial diffusion abnormality on the coronal images. However, both sets of images do suggest a small area of susceptibility in the central lower pons likely from chronic microhemorrhage (series 3, image 18 and series 4, image 19). Electronically Signed   By: 12/09/2019 M.D.   On: 10/09/2019 17:24   Result Date: 10/09/2019 CLINICAL DATA:  69 year old female, code stroke  presentation on 09/30/2019. Posterior right hemisphere cytotoxic edema evident by CT the next day. EXAM: MRI HEAD WITHOUT CONTRAST TECHNIQUE: Multiplanar, multiecho pulse sequences of the brain and surrounding structures were obtained without intravenous contrast. COMPARISON:  CT head 10/01/2019, CTA and CTP 09/30/2019 FINDINGS: The examination had to be discontinued prior to completion due to patient inability to tolerate. Axial and coronal DWI imaging only was obtained. There is a large 6 cm area of confluent restricted diffusion in the right parietal lobe, with contiguous additional restricted diffusion in the deep right MCA territory white matter (series 3, image 38). Much of this corresponds to abnormal perfusion T-max on the comparison. Additionally there is confluent diffusion restriction in the splenium of the corpus callosum on series 3, image 32. There is also abnormal diffusion in the contralateral left MCA periventricular white matter (also on image 38) although affecting much smaller area. Furthermore, there is probably artifactual signal  in the occipital lobes on series 3, which is not correlated on coronal diffusion series 4 (series 3, image 28 versus series 4, image 9). No midline shift or significant intracranial mass effect. Basilar cisterns appear patent. No definite ventriculomegaly. IMPRESSION: 1. Exam limited to DWI secondary to patient inability to tolerate. 2. Large infarct in the posterior Right MCA territory, including confluent ischemia in the deep Right MCA white matter, and the splenium of the corpus callosum. Aside from the splenium, these areas were abnormal on CTP last week. 3. But there is also evidence of patchy infarcts in the deep Left MCA white matter, not evident last week. 4. No significant intracranial mass effect. Electronically Signed: By: Genevie Ann M.D. On: 10/09/2019 16:08    PHYSICAL EXAM  [name] not in distress. Afebrile. Head is nontraumatic.  Neck is supple without bruit.    Cardiac exam no murmur or gallop. Lungs are clear to auscultation. Distal pulses are well felt.  Neurological Exam :  She is awake alert and interactive, eyes open, less lethargic than yesterday. With eye opening, eye right gaze preference and barely cross midline. Not blinking to visual threat on the left, but blinking to visual threat on the right, able to track on the right. PERRL. Right neglect. Left facial droop. Tongue protrusion not corporative. RUE 4/5 at least, LUE improved today to bicep 2/5 but proximal 0/5 and tricep 0/5, no finger movement. RLE at least 3/5 but LLE3/5 knee extension, 3-4/5 foot DF/PF.  Left DTR diminished, and no babinski. Sensation, coordination not corporative and gait not tested.   ASSESSMENT/PLAN [name] is a 69 y.o. female with history of cholecystectomy and left rotator cuff repair presenting to [place] 3/25 with intermittent N/V x 1 wk and intermittent epigastric pain x 2 weeks. Found to have a NSTEMI. Admitted for workup, which revealed CAD, HFrEF who developed AMS. CT head neg. Developed L facial droop, LUE weakness, dysarthria and LUE weakness. CT now w/ R basal ganglia infarct and possible L putamen abnormality d/t LVO. AMS felt d/t medication side effect.  .   Stroke: R MCA large infarct, along with small left CR and splenium infarcts, embolic pattern more likely due to transient cardiomyopathy with low EF, but right M1 and M2 tandem stenosis with hypotension and severe anemia also contributes  CT head 3/28 No acute abnormality. Small vessel disease. Atrophy.   CT head 3/29 R basal ganglia infarct and L putamen infarct, age indeterminate. Small old R pontine infarct   CTA head & neck distal R M1 moderate stenosis, R M2 inferior branch moderate to severe stenosis. Proximal and distal R PCA moderate stenosis.   CT perfusion 12ml penumbra R parietal love  CT head 3/30 acute R parietal lobe  infarct.   MRI 4/6 right MCA large infarct, small left CR and splenium infarcts  2D Echo 3/26 EF 25-30%   2D echo 3/31 EF 50-55%  EEG nonspecific R slowing, no sz  LDL 37  HgbA1c 5.9  SCDs for VTE prophylaxis  No antithrombotic prior to admission, now on clopidogrel 75 mg daily. Aspirin stopped d/t UGIB with severe anemia.    Therapy recommendations:  CIR vs. SNF  Disposition:  pending   UGIB with severe Anemia  Hx PUD on antacids prn PTA   Developed melena on aspirin and plavix  HGB 11->9.5->8.7->7.3->PRBC->9.1->9.4->8.0   Transfused PRBCs  On IV PPI  EGD - duodenal ulcer bleeding s/p treatment  Full liquid diet for now as per GI  If rebleeding, will need IR embolization  R groin hematoma following fall  Hematoma at cath site  CT confirmed Hematoma  HGB 11->9.5->8.7->7.3->9.1->9.4->6.9->8.0   CAD w/ NSTEMI  Severe diffuse 3v dz w/ severe LV dysfunction  S/p stent placement proximal mid LAD and mid CX  on plavix given stent  Aspirin stopped d/t melena and anemia  Acute systolic HF d/t NICM, resolved   EF 25-30% on 3/26  Put on coreg and entresto  EF 50-55% on 3/31  Coreg and entresto were on hold due to hypotension -> resume enestro today w/ plans to add BB back soon  Hypotension, resolved . BP low at 70s in the setting of melena and severe anemia . Improved on IVF resuscitation . S/p PRBC transfusion . BP much improved . Long term goal 130-150 given severe right MCA tendam high grade stenosis  Lipid management  Home meds:  No statin listed  LDL 37, at goal < 70  Now on lipitor 80  Continue statin at discharge  Dysphagia Severe protein calorie malnutrition . Secondary to stroke . Speech onboard, continues to follow . SLP cleared for D1 (puree) nectar thick liquid diet - GI wants to keep on full liquid diet today . Currently n.p.o. due to GI bleeding  Other Stroke Risk Factors  Advanced age  Overweight, Body mass index  is 29.15 kg/m., recommend weight loss, diet and exercise as appropriate   Family hx stroke (mother)  New onset Congestive heart failure - HFrEF  NSVT  Other Active Problems  Hypokalemia/hypomagnesemia  Leukocytosis WBC 16.1->17.7->16.8->17.4->11.0->14.3  Encephalopathy - on amantadine   Hypernatremia -> Na 147, IVF with D5  Hospital day # 13  I had long discussion with son at bedside, updated pt current condition, treatment plan and potential prognosis, and answered all the questions. Son expressed understanding and appreciation.   Neurology will sign off. Please call with questions. Pt will follow up with stroke clinic Dr. Pearlean Brownie at [facility] in about 4 weeks. Thanks for the consult.  Marvel Plan, MD PhD Stroke Neurology 10/10/2019 11:57 AM   To contact Stroke Continuity provider, please refer to [email or web address]. After hours, contact General Neurology

## 2019-10-10 NOTE — Progress Notes (Addendum)
PROGRESS NOTE   Brittney Kennedy  BSW:[id number]    DOB: 05-03-1951    DOA: 09/27/2019  PCP: Patient, No Pcp Per   I have briefly reviewed patients previous medical records in [facility].  Chief Complaint:     Brief Narrative:  70 year old female with PMH of chronic systolic CHF and CAD who was admitted from 3/25-3/26 at [facility] for NSTEMI with cath revealing severe multivessel CAD, EF 20%.  She was transferred to [facility] for CHF optimization and CT surgical evaluation for CABG.  Hospital course complicated by fall and right groin hematoma, acute encephalopathy, acute CVA, acute upper GI bleed due to duodenal ulcer s/p EGD 4/6, acute blood loss anemia requiring blood transfusions.  Cardiology, neurology, [facility] on board.  Care transferred to [facility] on 4/7.   Assessment & Plan:  Principal Problem:   NSTEMI (non-ST elevated myocardial infarction) [facility]) Active Problems:   Acute systolic CHF (congestive heart failure) (HCC)   Cerebral embolism with cerebral infarction   Malnutrition of moderate degree   Upper GI bleed   Status post coronary artery stent placement   Acute blood loss anemia   Duodenal ulcer   CAD with NSTEMI: Cath 3/26 with severe diffuse three-vessel CAD, EF 20%.  Transferred from [facility] to [facility] for CABG evaluation but not felt to be an appropriate candidate.  S/p PCI of proximal LAD with DES x1 and PCI of mid circumflex with DES x1 on 3/29.  TTE 10/02/2019 showed improved EF up to 50-55%.  No anginal symptoms.  Aspirin on hold due to GI bleed.  Continuing Plavix and statins.  Cardiology following and will await their input regarding starting beta-blockers.  Acute systolic CHF due to NICM: Initial LVEF 25%.  Repeat TTE 3/31 showed LVEF has normalized to 50-55%.  Cardiology follow-up appreciated and considering restarting Entresto.  Right groin hematoma sustained status post fall: Had hematoma at cath site.  Ultrasound negative for pseudoaneurysm.  CT positive for hematoma.  Site  appears stable.  Does not seem to be the cause for blood loss anemia at this time.  Holding Lovenox.  Hypokalemia and hypomagnesemia: Replaced.  Severe protein calorie malnutrition: Advance diet as tolerated and based on stability of GI bleed.  Physical deconditioning: Therapies to continue to work with her.  Likely CIR when stabilized.  Acute MCA stroke with left hemiplegia: CT head 3/28 without acute findings.  CT head 3/29 with right basilar infarct, small right pontine infarct.  CT head 3/30: Acute infarct centered in the right parietal lobe.  Mental status changes improving.  Still has dysarthria and aphasia.  Currently on nectar thickened liquids, when able advance to dysphagia 1 diet.  DC to CIR when able.  Remains on Plavix.  Repeat MRI brain 4/7: Large infarct in the posterior right MCA territory, including confluent ischemia in the deep right MCA white matter, and the splenium of the corpus callosum.  Aside from the splenium, these areas were abnormal on CTP last week.  There is also evidence of patchy infarcts in the deep left MCA white matter, not evident last week.  No significant intracranial mass-effect. Dr. Roda Shutters, neurology aware of these results.  Hypernatremia: Sodium 148 >147.  Due to poor oral intake, currently hypernatremic, will continue additional 24 hours of gentle D5 IV infusion, reviewed with neurology 4/7 and okay.  Also EF has normalized so should be able to tolerate.  NSVT: Cardiology considering beta-blockers.  Hypomagnesemia and hypokalemia replaced.  Acute upper GI bleed due to duodenal ulcer:  S/p urgent EGD 4/6 which showed duodenal bulb ulcer with visible blood vessel, endoclips x5 placed.  Had a smidge of old blood PR in the last 48 hours but no large melena or rectal bleed.  [facility] following, discussed with Dr. Adela Lank.  Aspirin discontinued but remains on Plavix due to recently placed coronary DES.  Continue IV Protonix infusion for 72 hours post EGD then IV  twice daily.  Some drop in hemoglobin from 9.4-8 today.  Reassess later this afternoon.  If has recurrent bleeding, will need IR to evaluate for embolization.  Currently on nectar thickened liquids.  Acute blood loss anemia: Secondary to GI bleed.  Has been transfused total of 3 units PRBCs thus far, last transfusion on 4/7.  Hemoglobin had improved from 7.3-9.4.  This morning's labs of 6.9 was likely an error.  Repeat hemoglobin 8 g per DL.  Follow CBC later this afternoon and transfuse if less than 8 g per DL given recent NSTEMI.  Hemoglobin up this afternoon from 8-8.7.  Leukocytosis: Likely stress response.  No overt signs or symptoms of infection.  Urine culture positive but no urinary symptoms.  Unless develops fever or clear signs and symptoms of infection, hold off antibiotics.  WBC down to 14 K.  Acute encephalopathy: Likely multifactorial related to recent acute stroke, medications and others.  Improving.  Dysphagia: Currently on nectar thickened liquids, advance to D1 diet when able.  Prediabetes: A1c 5.9 on 3/25.  SSI.   Body mass index is 29.15 kg/m.  Nutritional Status Nutrition Problem: Moderate Malnutrition Etiology: acute illness Signs/Symptoms: mild muscle depletion, mild fat depletion Interventions: Tube feeding  DVT prophylaxis: SCDs Code Status: Full Family Communication: Discussed in detail with patient's son again today, updated care and answered questions.  Patient's best friend was also at bedside.  None at bedside this morning. Disposition:  . Patient came from: Home           . Anticipated d/c place: CIR . Barriers to d/c: Recent acute upper GI bleed due to bleeding duodenal ulcer, acute blood loss anemia that requires close monitoring and intervention.   Consultants:   Cardiology Neurology [facility] MD  Procedures:   EGD 4/6  Antimicrobials:   None   Subjective:  Patient seen in room alone.  Alert and oriented to self and place.   Significant dysarthria but responds appropriately.  Indicates that she is thirsty.  Tells me her son's and friends name who are here yesterday.  Follows simple instructions.  No pain or dyspnea reported as per RN, no acute issues noted.  Objective:   Vitals:   10/10/19 0040 10/10/19 0509 10/10/19 0746 10/10/19 1205  BP: (!) 140/47 (!) 152/60 (!) 144/53 135/88  Pulse: 79 79 81 80  Resp: 18 16 16 15   Temp: 97.6 F (36.4 C) 98.2 F (36.8 C) 97.9 F (36.6 C) 98.4 F (36.9 C)  TempSrc: Oral Oral Oral Oral  SpO2: 100% (!) 57% 100% 100%  Weight:      Height:        General exam: Middle-aged female, moderately built and overweight lying comfortably propped up in bed without distress.  On room air. Respiratory system: Clear to auscultation.  No increased work of breathing. Cardiovascular system: S1 and S2 heard, RRR.  No JVD, murmurs or pedal edema.  Telemetry personally reviewed: Sinus rhythm.  Occasional nonsustained SVT. Gastrointestinal system: Abdomen is nondistended, soft and nontender. No organomegaly or masses felt. Normal bowel sounds heard.  Right groin  with subacute bruising/hematoma, does not appear new, as examined on 4/7. Central nervous system: Alert and oriented x2.  Severe dysarthria.  Facial asymmetry +. Extremities: Right upper extremity 5 x 5 power, right lower extremity at least grade 4 x 5 power.  Left extremities grade 1 x 5 power. Skin: No rashes, lesions or ulcers Psychiatry: Judgement and insight impaired. Mood & affect cannot be assessed at this time.     Data Reviewed:   I have personally reviewed following labs and imaging studies   CBC: Recent Labs  Lab 10/09/19 2101 10/10/19 0408 10/10/19 0811  WBC 17.4* 11.0* 14.3*  HGB 9.4* 6.9* 8.0*  HCT 30.0* 23.5* 26.0*  MCV 96.2 105.4* 96.7  PLT 197 170 208    Basic Metabolic Panel: Recent Labs  Lab 10/04/19 0400 10/04/19 0400 10/05/19 0350 10/05/19 0350 10/06/19 0439 10/07/19 0446 10/08/19 0319  10/09/19 0428 10/10/19 0811  NA 150*   < > 153*   < > 148*   < > 144 148* 147*  K 4.3   < > 3.8   < > 3.5   < > 4.3 4.3 3.6  CL 115*   < > 116*   < > 113*   < > 110 117* 114*  CO2 21*   < > 23   < > 24   < > 22 22 23   GLUCOSE 109*   < > 128*   < > 123*   < > 178* 143* 118*  BUN 18   < > 17   < > 14   < > 49* 50* 33*  CREATININE 1.17*   < > 1.14*   < > 1.01*   < > 1.06* 1.17* 1.12*  CALCIUM 9.4   < > 9.4   < > 9.3   < > 8.9 8.5* 8.6*  MG 1.8   < > 1.8   < > 1.7  --  1.4* 2.2 1.9  PHOS 3.9  --  3.6  --  2.2*  --   --   --   --    < > = values in this interval not displayed.    Liver Function Tests: No results for input(s): AST, ALT, ALKPHOS, BILITOT, PROT, ALBUMIN in the last 168 hours.  CBG: Recent Labs  Lab 10/09/19 2145 10/10/19 0619 10/10/19 1152  GLUCAP 106* 88 128*    Microbiology Studies:   Recent Results (from the past 240 hour(s))  Culture, Urine     Status: Abnormal   Collection Time: 10/04/19  2:50 AM   Specimen: Urine, Clean Catch  Result Value Ref Range Status   Specimen Description URINE, CLEAN CATCH  Final   Special Requests   Final    NONE Performed at [facility], [address], [place], [place]    Culture >=100,000 COLONIES/mL AEROCOCCUS VIRIDANS (A)  Final   Report Status 10/06/2019 FINAL  Final     Radiology Studies:  MR BRAIN WO CONTRAST  Addendum Date: 10/09/2019   ADDENDUM REPORT: 10/09/2019 17:24 ADDENDUM: Study discussed by telephone with Dr. 12/09/2019 on 10/09/2019 at 1715 hours. We discussed the heterogeneous appearance of the brainstem and cerebellum on axial DWI. But I favor that is artifact, with no convincing infratentorial diffusion abnormality on the coronal images. However, both sets of images do suggest a small area of susceptibility in the central lower pons likely from chronic microhemorrhage (series 3, image 18 and series 4, image 19). Electronically Signed   By:  Genevie Ann M.D.   On: 10/09/2019 17:24   Result Date:  10/09/2019 CLINICAL DATA:  69 year old female, code stroke presentation on 09/30/2019. Posterior right hemisphere cytotoxic edema evident by CT the next day. EXAM: MRI HEAD WITHOUT CONTRAST TECHNIQUE: Multiplanar, multiecho pulse sequences of the brain and surrounding structures were obtained without intravenous contrast. COMPARISON:  CT head 10/01/2019, CTA and CTP 09/30/2019 FINDINGS: The examination had to be discontinued prior to completion due to patient inability to tolerate. Axial and coronal DWI imaging only was obtained. There is a large 6 cm area of confluent restricted diffusion in the right parietal lobe, with contiguous additional restricted diffusion in the deep right MCA territory white matter (series 3, image 38). Much of this corresponds to abnormal perfusion T-max on the comparison. Additionally there is confluent diffusion restriction in the splenium of the corpus callosum on series 3, image 32. There is also abnormal diffusion in the contralateral left MCA periventricular white matter (also on image 38) although affecting much smaller area. Furthermore, there is probably artifactual signal in the occipital lobes on series 3, which is not correlated on coronal diffusion series 4 (series 3, image 28 versus series 4, image 9). No midline shift or significant intracranial mass effect. Basilar cisterns appear patent. No definite ventriculomegaly. IMPRESSION: 1. Exam limited to DWI secondary to patient inability to tolerate. 2. Large infarct in the posterior Right MCA territory, including confluent ischemia in the deep Right MCA white matter, and the splenium of the corpus callosum. Aside from the splenium, these areas were abnormal on CTP last week. 3. But there is also evidence of patchy infarcts in the deep Left MCA white matter, not evident last week. 4. No significant intracranial mass effect. Electronically Signed: By: Genevie Ann M.D. On: 10/09/2019 16:08     Scheduled Meds:   . amantadine  100  mg Oral BID  . atorvastatin  80 mg Oral q1800  . chlorhexidine  15 mL Mouth Rinse BID  . clopidogrel  75 mg Oral Q breakfast  . feeding supplement  1 Container Oral BID BM  . insulin aspart  0-15 Units Subcutaneous TID WC  . mouth rinse  15 mL Mouth Rinse q12n4p  . sodium chloride flush  3 mL Intravenous Q12H    Continuous Infusions:   . sodium chloride Stopped (10/08/19 1620)  . dextrose 50 mL/hr at 10/10/19 1158  . pantoprozole (PROTONIX) infusion 8 mg/hr (10/10/19 0750)     LOS: 13 days     Vernell Leep, MD, [place], [facility]. [facility]    To contact the attending provider between 7A-7P or the covering provider during after hours 7P-7A, please log into the web site [email or web address] and access using universal [place] password for that web site. If you do not have the password, please call the hospital operator.  10/10/2019, 12:19 PM

## 2019-10-10 NOTE — Progress Notes (Signed)
  Speech Language Pathology Treatment: Dysphagia  Patient Details Name: Brittney Kennedy MRN: [id number] DOB: [birth year] Today's Date: 10/10/2019 Time: 6269-4854 SLP Time Calculation (min) (ACUTE ONLY): 31 min  Assessment / Plan / Recommendation Clinical Impression  Pt was seen for skilled ST targeting dysphagia and dysarthria.  She was encountered awake/alert with RN present at bedside.  RN reported that pt's hemoglobin level had improved enough for MD to clear the pt for a full liquid diet.  She was seen with trials of preferred yogurt, nectar-thick liquid, thin liquid, and ice chips.  Pt demonstrated good bolus acceptance of all trials and she was able to feed herself given min assistance and set up.  AP transport appeared timely with all trials, but suspect delayed swallow initiation with liquid and ice chip trials.  Pt independently completed oral care with suction swab prior to ice chip and thin liquid trials.  She exhibited an immediate cough following 3/5 small cup sips of thin liquid.  No overt s/sx of aspiration were observed with ice chips, tsp of thin liquid, nectar-thick liquid, or puree.  Re-educated pt and RN regarding the [employer] Protocol which allows the pt to have small sips of water and ice chips before or 30 minutes after a meal following thorough oral care.  Recommend upgrade to Dysphagia 1 (puree) solids and nectar-thick liquids when pt is medically appropriate.    Pt was re-educated regarding speech intelligibility strategies, in particular "over-articulation".   She verbalized understanding and demonstrated understanding in word repetition and conversational speech tasks.  Her speech was approximately 75% intelligible to an unfamiliar listener without the use of speech intelligibility strategies, and it improved to approximately 85% given the use of the over-articulation.  SLP encouraged pt to practice the speech intelligibly strategies in conversational speech throughout the  day.  Recommend additional ST 5x weekly high intensity (CIR) to continue targeting dysphagia and cognitive-linguistic deficits.  SLP will f/u per POC.     HPI HPI: 69 y/o female with hx of CAD, cholecystectomy, presented to ED with nausea/vomiting, epigastric pain for 1 week intermittently.  Admitted for NSTEMI, cath 3/26 revealed severe multivessel CAD and transferred to [facility].  Fall 3/27 with R groin hematoma.  Found AMS 3/28 due to pain meds.  Rapid response 3/29 due to AMS and L facial droop/weakness. CT revealed right basal ganglia infarct.  3/29 pm with worsening deficits; STAT CT head with right parietal infarct.       SLP Plan  Continue with current plan of care       Recommendations  Diet recommendations: Nectar-thick liquid;Dysphagia 1 (puree)(When cleared by MD ) Liquids provided via: Cup Medication Administration: Crushed with puree Supervision: Staff to assist with self feeding Compensations: Minimize environmental distractions;Slow rate;Small sips/bites Postural Changes and/or Swallow Maneuvers: Seated upright 90 degrees;Upright 30-60 min after meal                Oral Care Recommendations: Oral care QID;Oral care prior to ice chip/H20 Follow up Recommendations: Inpatient Rehab SLP Visit Diagnosis: Dysphagia, oropharyngeal phase (R13.12) Plan: Continue with current plan of care       GO               Villa Herb., M.S., CCC-SLP Acute Rehabilitation Services Office: [phone]  Brittney Kennedy [facility] 10/10/2019, 9:42 AM

## 2019-10-10 NOTE — Progress Notes (Signed)
Physical Therapy Treatment Patient Details Name: Brittney Kennedy MRN: [id number] DOB: December 07, 1950 Today's Date: 10/10/2019    History of Present Illness 69 year old female to the hospital for nausea, vomiting, epigastric pain x 1 week, admitted 09/27/19 for NSTEMI. cath 3/26 revealed severe multivessel CAD. Patient transferred to [facility]. Fall 3/27 with R groin hematoma. 3/28 patient with AMS due to pain meds and rapid response 3/29 due to AMS and L facial droop with weakness. CT: R basal ganglia infarct, MRI on 10/09/19 reveals large R MCA infarct. PMH: CAD, cholecystectomy    PT Comments    Pt motivated to participate in PT this session. Pt with improved activity tolerance today, participating in EOB dynamic sitting tasks, as well as repeated transfer training. Pt still requiring mod-max +2 assist for mobility, and LUE>LLE hemiparesis is limiting. Pt may benefit from LUE sling to approximate shoulder, as mild subluxation noted during session. PT continuing to recommend CIR, will continue to follow acutely.    Follow Up Recommendations  CIR     Equipment Recommendations  Other (comment)(TBD at next level of care)    Recommendations for Other Services Rehab consult     Precautions / Restrictions Precautions Precautions: Fall;Other (comment) Restrictions Weight Bearing Restrictions: No    Mobility  Bed Mobility Overal bed mobility: Needs Assistance Bed Mobility: Rolling;Sidelying to Sit Rolling: Mod assist Sidelying to sit: Max assist;+2 for physical assistance;+2 for safety/equipment;HOB elevated       General bed mobility comments: mod assist for roll to R with L shoulder girdle and truncal facilitation. Max +2 for sidelying to sit for trunk righting, LE management, scooting pt to EOB.  Transfers Overall transfer level: Needs assistance Equipment used: Ambulation equipment used Transfers: Sit to/from Stand Sit to Stand: Max assist;+2 physical assistance;+2 safety/equipment;From  elevated surface         General transfer comment: Max assist +2 for initial power up, hip extension via PT facilitation at pt pelvis, and steadying. Verbal cuing for R hand placement on stedy for facilitating power up. Repeated sit to stands in stedy for strengthening, repeated transfer training.  Ambulation/Gait             General Gait Details: unable this day   Stairs             Wheelchair Mobility    Modified Rankin (Stroke Patients Only) Modified Rankin (Stroke Patients Only) Pre-Morbid Rankin Score: No symptoms Modified Rankin: Severe disability     Balance Overall balance assessment: Needs assistance Sitting-balance support: Single extremity supported;Feet supported Sitting balance-Leahy Scale: Fair Sitting balance - Comments: fair to poor - able to sit unassisted for up to 1 minute with use of RUE on bedrail, heavy posterior and L lateral leaning without external support. Tolerated L lateral leaning to prop on elbow, followed by elbow extension to upright with min PT assist x5. Rest breaks required for pt fatigue. Postural control: Posterior lean;Left lateral lean Standing balance support: Bilateral upper extremity supported Standing balance-Leahy Scale: Zero Standing balance comment: max +2 for transfer to recliner                            Cognition Arousal/Alertness: Awake/alert(drowsy, in and out of true alertness) Behavior During Therapy: Flat affect Overall Cognitive Status: Difficult to assess Area of Impairment: Attention;Orientation;Memory;Following commands;Safety/judgement                   Current Attention Level: Sustained Memory: Decreased short-term memory  Following Commands: Follows one step commands with increased time Safety/Judgement: Decreased awareness of deficits;Decreased awareness of safety Awareness: Emergent Problem Solving: Slow processing;Decreased initiation;Difficulty sequencing;Requires verbal  cues;Requires tactile cues General Comments: Pt explaining how one of her sons passed away, fact-checked with pt's son who was present and he states this is not true. Requires multimodal cuing, especially for attending to left.      Exercises Other Exercises Other Exercises: positioning in recliner with LUE supported, bilateral heels floating, and pillows wedged behind R shoulder to keep pt in midline of chair Other Exercises: pregait tasks in standing - toe tapping x5, heel rise x5 during 2/5 standing trials    General Comments        Pertinent Vitals/Pain Pain Assessment: Faces Faces Pain Scale: Hurts a little bit Pain Location: generalized Pain Descriptors / Indicators: Grimacing;Discomfort Pain Intervention(s): Limited activity within patient's tolerance;Monitored during session;Repositioned    Home Living                      Prior Function            PT Goals (current goals can now be found in the care plan section) Acute Rehab PT Goals Patient Stated Goal: get some ice PT Goal Formulation: With patient/family Time For Goal Achievement: 10/14/19 Potential to Achieve Goals: Good Progress towards PT goals: Progressing toward goals    Frequency    Min 4X/week      PT Plan Current plan remains appropriate    Co-evaluation              AM-PAC PT "6 Clicks" Mobility   Outcome Measure  Help needed turning from your back to your side while in a flat bed without using bedrails?: A Lot Help needed moving from lying on your back to sitting on the side of a flat bed without using bedrails?: Total Help needed moving to and from a bed to a chair (including a wheelchair)?: Total Help needed standing up from a chair using your arms (e.g., wheelchair or bedside chair)?: Total Help needed to walk in hospital room?: Total Help needed climbing 3-5 steps with a railing? : Total 6 Click Score: 7    End of Session Equipment Utilized During Treatment: Gait  belt Activity Tolerance: Patient tolerated treatment well Patient left: with call bell/phone within reach;in chair;with family/visitor present(family to stay in room with pt 100% of time until pt goes back to bed) Nurse Communication: Mobility status PT Visit Diagnosis: Other abnormalities of gait and mobility (R26.89);Muscle weakness (generalized) (M62.81);Hemiplegia and hemiparesis;Other symptoms and signs involving the nervous system (R29.898) Hemiplegia - Right/Left: Left Hemiplegia - dominant/non-dominant: Non-dominant Hemiplegia - caused by: Cerebral infarction     Time: 7353-2992 PT Time Calculation (min) (ACUTE ONLY): 29 min  Charges:  $Therapeutic Activity: 8-22 mins $Neuromuscular Re-education: 8-22 mins                    Zanita Millman E, PT Acute Rehabilitation Services Pager [phone]  Office [phone]   Navid Lenzen D Elonda Husky 10/10/2019, 4:28 PM

## 2019-10-11 ENCOUNTER — Inpatient Hospital Stay (HOSPITAL_COMMUNITY): Payer: Medicare Other

## 2019-10-11 DIAGNOSIS — K269 Duodenal ulcer, unspecified as acute or chronic, without hemorrhage or perforation: Secondary | ICD-10-CM

## 2019-10-11 DIAGNOSIS — I639 Cerebral infarction, unspecified: Secondary | ICD-10-CM

## 2019-10-11 LAB — BASIC METABOLIC PANEL
Anion gap: 9 (ref 5–15)
BUN: 23 mg/dL (ref 8–23)
CO2: 22 mmol/L (ref 22–32)
Calcium: 8.6 mg/dL — ABNORMAL LOW (ref 8.9–10.3)
Chloride: 110 mmol/L (ref 98–111)
Creatinine, Ser: 1.02 mg/dL — ABNORMAL HIGH (ref 0.44–1.00)
GFR calc Af Amer: >60 mL/min (ref >60)
GFR calc non Af Amer: 57 mL/min — ABNORMAL LOW (ref >60)
Glucose, Bld: 126 mg/dL — ABNORMAL HIGH (ref 70–99)
Potassium: 3.5 mmol/L (ref 3.5–5.1)
Sodium: 141 mmol/L (ref 135–145)

## 2019-10-11 LAB — CBC
HCT: 28.2 % — ABNORMAL LOW (ref 36.0–46.0)
Hemoglobin: 8.7 g/dL — ABNORMAL LOW (ref 12.0–15.0)
MCH: 30.2 pg (ref 26.0–34.0)
MCHC: 30.9 g/dL (ref 30.0–36.0)
MCV: 97.9 fL (ref 80.0–100.0)
Platelets: 198 10*3/uL (ref 150–400)
RBC: 2.88 MIL/uL — ABNORMAL LOW (ref 3.87–5.11)
RDW: 18.7 % — ABNORMAL HIGH (ref 11.5–15.5)
WBC: 15.1 10*3/uL — ABNORMAL HIGH (ref 4.0–10.5)
nRBC: 0.3 % — ABNORMAL HIGH (ref 0.0–0.2)

## 2019-10-11 LAB — GLUCOSE, CAPILLARY
Glucose-Capillary: 106 mg/dL — ABNORMAL HIGH (ref 70–99)
Glucose-Capillary: 139 mg/dL — ABNORMAL HIGH (ref 70–99)
Glucose-Capillary: 155 mg/dL — ABNORMAL HIGH (ref 70–99)
Glucose-Capillary: 101 mg/dL — ABNORMAL HIGH (ref 70–99)

## 2019-10-11 LAB — MAGNESIUM: Magnesium: 1.6 mg/dL — ABNORMAL LOW (ref 1.7–2.4)

## 2019-10-11 MED ORDER — HYPROMELLOSE (GONIOSCOPIC) 2.5 % OP SOLN: 2.0000 [drp] | Freq: Three times a day (TID) | OPHTHALMIC | Status: DC | PRN

## 2019-10-11 MED ORDER — MELATONIN 3 MG PO TABS
3.0000 mg | ORAL_TABLET | Freq: Every day | ORAL | Status: DC
  Administered 2019-10-11 – 2019-10-15 (×5): 3 mg via ORAL
  Filled 2019-10-11 (×5): qty 1

## 2019-10-11 MED ORDER — PANTOPRAZOLE SODIUM 40 MG IV SOLR
40.0000 mg | Freq: Two times a day (BID) | INTRAVENOUS | Status: DC
  Administered 2019-10-11 – 2019-10-16 (×10): 40 mg via INTRAVENOUS
  Filled 2019-10-11 (×10): qty 40

## 2019-10-11 MED ORDER — POTASSIUM CHLORIDE CRYS ER 20 MEQ PO TBCR
40.0000 meq | EXTENDED_RELEASE_TABLET | Freq: Once | ORAL | Status: AC
  Administered 2019-10-11: 40 meq via ORAL
  Filled 2019-10-11: qty 2

## 2019-10-11 MED ORDER — MAGNESIUM SULFATE 2 GM/50ML IV SOLN
2.0000 g | Freq: Once | INTRAVENOUS | Status: AC
  Administered 2019-10-11: 2 g via INTRAVENOUS
  Filled 2019-10-11: qty 50

## 2019-10-11 MED ORDER — POLYVINYL ALCOHOL 1.4 % OP SOLN
1.0000 [drp] | Freq: Three times a day (TID) | OPHTHALMIC | Status: DC | PRN
  Administered 2019-10-11 – 2019-10-15 (×6): 1 [drp] via OPHTHALMIC
  Filled 2019-10-11: qty 15

## 2019-10-11 MED ORDER — CARVEDILOL 3.125 MG PO TABS
3.1250 mg | ORAL_TABLET | Freq: Two times a day (BID) | ORAL | Status: DC
  Administered 2019-10-12 – 2019-10-16 (×9): 3.125 mg via ORAL
  Filled 2019-10-11 (×9): qty 1

## 2019-10-11 NOTE — Progress Notes (Signed)
Purwic became misplaced and leaked. Patient was provided incontinent care and a new pur wic was placed. Breeona Waid RN

## 2019-10-11 NOTE — Progress Notes (Signed)
Chaplain on return visit for AD notary to fulfill promise to Mrs. Alisan's son to meet at 8:30am today.  Chaplain was able to notarize AD for Mrs. Gaytha.  Prayed with Mrs. Shermika, her son and her physician.  Vernell Morgans Chaplain Resident

## 2019-10-11 NOTE — Progress Notes (Signed)
Physical Therapy Treatment Patient Details Name: Brittney Kennedy MRN: [id number] DOB: [birth year] Today's Date: 10/11/2019    History of Present Illness 69 year old female to the hospital for nausea, vomiting, epigastric pain x 1 week, admitted 09/27/19 for NSTEMI. cath 3/26 revealed severe multivessel CAD. Patient transferred to [facility]. Fall 3/27 with R groin hematoma. 3/28 patient with AMS due to pain meds and rapid response 3/29 due to AMS and L facial droop with weakness. CT: R basal ganglia infarct, MRI on 10/09/19 reveals large R MCA infarct. PMH: CAD, cholecystectomy    PT Comments    Pt with decreased attention to task, and to the left in general, pt is dysarthric and not easily intelligible.  Noted movement to command and tactile movement at L UE and L LE's  Emphasis on transition, sitting balance, sit to stand in the STEDY, 1 trial of standing in STEDY up to 40 secs in length.  Transfer by STEDY to recliner for working on sitting tolerance.    Follow Up Recommendations  CIR     Equipment Recommendations  Other (comment)(TBD)    Recommendations for Other Services Rehab consult     Precautions / Restrictions Precautions Precautions: Fall Precaution Comments: aspiration precautions (dysphagia 1 with nectar), HOB >30 degrees    Mobility  Bed Mobility Overal bed mobility: Needs Assistance Bed Mobility: Rolling;Sidelying to Sit Rolling: Mod assist;Max assist Sidelying to sit: Max assist;+2 for physical assistance       General bed mobility comments: tactile cues in addition to verbal direction.  Assist shoulder complex over to help with roll and truncal assist up via r elbow  Transfers Overall transfer level: Needs assistance Equipment used: Ambulation equipment used Transfers: Sit to/from Stand Sit to Stand: Max assist;+2 physical assistance;+2 safety/equipment(x3)         General transfer comment: Max assist of 2 with pad to help pt forward and up.  Pt's L UE need  protection due to it being low tone.  Ambulation/Gait             General Gait Details: unable   Stairs             Wheelchair Mobility    Modified Rankin (Stroke Patients Only) Modified Rankin (Stroke Patients Only) Pre-Morbid Rankin Score: No symptoms Modified Rankin: Severe disability     Balance Overall balance assessment: Needs assistance Sitting-balance support: Single extremity supported;Feet supported Sitting balance-Leahy Scale: Fair Sitting balance - Comments: but pt is not able to be left at EOB due to lack of control.  Worked at [employer] for 5-6 min or midline posture, symmetry and drawing attension and vision to the left   Standing balance support: Bilateral upper extremity supported Standing balance-Leahy Scale: Poor Standing balance comment: standing in the STEDY x3 with safest technique with pads and face to face assist.                            Cognition Arousal/Alertness: Awake/alert Behavior During Therapy: Flat affect Overall Cognitive Status: Difficult to assess Area of Impairment: Attention;Orientation;Memory;Following commands;Safety/judgement                   Current Attention Level: Sustained   Following Commands: Follows one step commands with increased time Safety/Judgement: Decreased awareness of safety;Decreased awareness of deficits Awareness: Emergent Problem Solving: Slow processing        Exercises Other Exercises Other Exercises: warm up bil LE hip/knee exercise    General  Comments        Pertinent Vitals/Pain Pain Assessment: Faces Faces Pain Scale: No hurt Pain Intervention(s): Monitored during session    Home Living                      Prior Function            PT Goals (current goals can now be found in the care plan section) Acute Rehab PT Goals Time For Goal Achievement: 10/14/19 Potential to Achieve Goals: Good Progress towards PT goals: Progressing toward goals     Frequency    Min 4X/week      PT Plan Current plan remains appropriate    Co-evaluation              AM-PAC PT "6 Clicks" Mobility   Outcome Measure  Help needed turning from your back to your side while in a flat bed without using bedrails?: A Lot Help needed moving from lying on your back to sitting on the side of a flat bed without using bedrails?: Total Help needed moving to and from a bed to a chair (including a wheelchair)?: Total Help needed standing up from a chair using your arms (e.g., wheelchair or bedside chair)?: Total Help needed to walk in hospital room?: Total Help needed climbing 3-5 steps with a railing? : Total 6 Click Score: 7    End of Session   Activity Tolerance: Patient limited by fatigue;Patient tolerated treatment well Patient left: in chair;with call bell/phone within reach;with chair alarm set;with family/visitor present;Other (comment)(on the lift pad.) Nurse Communication: Mobility status PT Visit Diagnosis: Other abnormalities of gait and mobility (R26.89);Muscle weakness (generalized) (M62.81);Hemiplegia and hemiparesis;Other symptoms and signs involving the nervous system (R29.898) Hemiplegia - Right/Left: Left Hemiplegia - dominant/non-dominant: Non-dominant Hemiplegia - caused by: Cerebral infarction     Time: 1430-1454 PT Time Calculation (min) (ACUTE ONLY): 24 min  Charges:  $Therapeutic Activity: 8-22 mins $Neuromuscular Re-education: 8-22 mins                     10/11/2019  Jacinto Halim., PT Acute Rehabilitation Services [phone]  (pager) [phone]  (office)   Eliseo Gum Zanaria Morell 10/11/2019, 3:54 PM

## 2019-10-11 NOTE — Progress Notes (Addendum)
Advanced Heart Failure Rounding Note   Subjective:    Events  - cath 09/27/19 3v CAD. EF 20% - right groin hematoma. U/s 3/27 negative for PSA - 3/28 altered mental status due to pain meds. Head CT, ABG and ammonia normal - 09/30/19 CVA, ACS with DES LAD and mid CX - 3/30 Headache with stat CT. EEG- no seizure, cortical dysfunction in the right hemisphere likely secondary to underlying infarct as well as moderate to severe diffuse encephalopathy, nonspecific to etiology. - 3/30 Echo EF 50-55% - 4/6 developed GIB. Hgb 7.3. Transfused x 2 units. Urgent EGD 4/6 showed an actively bleeding duodenal bulb ulcer with visible vessel, treated with epi and hemostasis clips x 5. Hemostasis achieved  -4/7 Got another unit of RBCs. Hgb up to 9.1. Placed on protonix drip.   No further bleeding.   Denies SOB. Denies pain. Had a hard time sleeping last night.   Objective:   Weight Range:  Vital Signs:   Temp:  [98 F (36.7 C)-100.2 F (37.9 C)] 98.2 F (36.8 C) (04/09 0738) Pulse Rate:  [80-87] 84 (04/09 0738) Resp:  [15-20] 17 (04/09 0825) BP: (128-155)/(50-88) 136/62 (04/09 0738) SpO2:  [97 %-100 %] 100 % (04/09 0738) Last BM Date: 10/09/19  Weight change: Filed Weights   10/06/19 0528 10/07/19 0431 10/09/19 0500  Weight: 72.6 kg 74.1 kg 72.3 kg    Intake/Output:   Intake/Output Summary (Last 24 hours) at 10/11/2019 1119 Last data filed at 10/11/2019 0928 Gross per 24 hour  Intake 1652.88 ml  Output 1300 ml  Net 352.88 ml    PHYSICAL EXAM: General:  Sitting on the side of the bed. No resp difficulty HEENT: Left facial droop.  Neck: supple. no JVD. Carotids 2+ bilat; no bruits. No lymphadenopathy or thryomegaly appreciated. Cor: PMI nondisplaced. Regular rate & rhythm. No rubs, gallops or murmurs. Lungs: clear Abdomen: soft, nontender, nondistended. No hepatosplenomegaly. No bruits or masses. Good bowel sounds. Extremities: no cyanosis, clubbing, rash, edema. R groin  ecchymotic  Neuro: Speech garbles. alert & orientedx3, LUE flaccid. LLE weak.   Telemetry: NSR 70-80s with occasional PVCs.   Labs: Basic Metabolic Panel: Recent Labs  Lab 10/05/19 0350 10/05/19 0350 10/06/19 0439 10/06/19 0439 10/07/19 0446 10/07/19 0446 10/08/19 0319 10/08/19 0319 10/09/19 0428 10/10/19 0811 10/11/19 0406  NA 153*   < > 148*   < > 145  --  144  --  148* 147* 141  K 3.8   < > 3.5   < > 3.3*  --  4.3  --  4.3 3.6 3.5  CL 116*   < > 113*   < > 110  --  110  --  117* 114* 110  CO2 23   < > 24   < > 23  --  22  --  22 23 22   GLUCOSE 128*   < > 123*   < > 115*  --  178*  --  143* 118* 126*  BUN 17   < > 14   < > 13  --  49*  --  50* 33* 23  CREATININE 1.14*   < > 1.01*   < > 0.90  --  1.06*  --  1.17* 1.12* 1.02*  CALCIUM 9.4   < > 9.3   < > 8.8*   < > 8.9   < > 8.5* 8.6* 8.6*  MG 1.8   < > 1.7  --   --   --  1.4*  --  2.2 1.9 1.6*  PHOS 3.6  --  2.2*  --   --   --   --   --   --   --   --    < > = values in this interval not displayed.    Liver Function Tests: No results for input(s): AST, ALT, ALKPHOS, BILITOT, PROT, ALBUMIN in the last 168 hours. No results for input(s): LIPASE, AMYLASE in the last 168 hours. No results for input(s): AMMONIA in the last 168 hours.  CBC: Recent Labs  Lab 10/09/19 2101 10/10/19 0408 10/10/19 0811 10/10/19 1821 10/11/19 0406  WBC 17.4* 11.0* 14.3* 13.3* 15.1*  HGB 9.4* 6.9* 8.0* 8.7* 8.7*  HCT 30.0* 23.5* 26.0* 27.8* 28.2*  MCV 96.2 105.4* 96.7 98.6 97.9  PLT 197 170 208 209 198    Cardiac Enzymes: No results for input(s): CKTOTAL, CKMB, CKMBINDEX, TROPONINI in the last 168 hours.  BNP: BNP (last 3 results) No results for input(s): BNP in the last 8760 hours.  ProBNP (last 3 results) No results for input(s): PROBNP in the last 8760 hours.    Other results:  Imaging: MR BRAIN WO CONTRAST  Addendum Date: 10/09/2019   ADDENDUM REPORT: 10/09/2019 17:24 ADDENDUM: Study discussed by telephone with Dr.  Marvel Plan on 10/09/2019 at 1715 hours. We discussed the heterogeneous appearance of the brainstem and cerebellum on axial DWI. But I favor that is artifact, with no convincing infratentorial diffusion abnormality on the coronal images. However, both sets of images do suggest a small area of susceptibility in the central lower pons likely from chronic microhemorrhage (series 3, image 18 and series 4, image 19). Electronically Signed   By: Odessa Fleming M.D.   On: 10/09/2019 17:24   Result Date: 10/09/2019 CLINICAL DATA:  69 year old female, code stroke presentation on 09/30/2019. Posterior right hemisphere cytotoxic edema evident by CT the next day. EXAM: MRI HEAD WITHOUT CONTRAST TECHNIQUE: Multiplanar, multiecho pulse sequences of the brain and surrounding structures were obtained without intravenous contrast. COMPARISON:  CT head 10/01/2019, CTA and CTP 09/30/2019 FINDINGS: The examination had to be discontinued prior to completion due to patient inability to tolerate. Axial and coronal DWI imaging only was obtained. There is a large 6 cm area of confluent restricted diffusion in the right parietal lobe, with contiguous additional restricted diffusion in the deep right MCA territory white matter (series 3, image 38). Much of this corresponds to abnormal perfusion T-max on the comparison. Additionally there is confluent diffusion restriction in the splenium of the corpus callosum on series 3, image 32. There is also abnormal diffusion in the contralateral left MCA periventricular white matter (also on image 38) although affecting much smaller area. Furthermore, there is probably artifactual signal in the occipital lobes on series 3, which is not correlated on coronal diffusion series 4 (series 3, image 28 versus series 4, image 9). No midline shift or significant intracranial mass effect. Basilar cisterns appear patent. No definite ventriculomegaly. IMPRESSION: 1. Exam limited to DWI secondary to patient inability to  tolerate. 2. Large infarct in the posterior Right MCA territory, including confluent ischemia in the deep Right MCA white matter, and the splenium of the corpus callosum. Aside from the splenium, these areas were abnormal on CTP last week. 3. But there is also evidence of patchy infarcts in the deep Left MCA white matter, not evident last week. 4. No significant intracranial mass effect. Electronically Signed: By: Odessa Fleming M.D. On: 10/09/2019 16:08   VAS  [place] LOWER EXTREMITY VENOUS (DVT)  Result Date: 10/11/2019  Lower Venous DVTStudy Indications: Stroke.  Risk Factors: None identified. Limitations: Poor ultrasound/tissue interface and patient positioning. Comparison Study: No prior studies. Performing Technologist: Chanda Busing RVT  Examination Guidelines: A complete evaluation includes B-mode imaging, spectral Doppler, color Doppler, and power Doppler as needed of all accessible portions of each vessel. Bilateral testing is considered an integral part of a complete examination. Limited examinations for reoccurring indications may be performed as noted. The reflux portion of the exam is performed with the patient in reverse Trendelenburg.  +---------+---------------+---------+-----------+----------+--------------+ RIGHT    CompressibilityPhasicitySpontaneityPropertiesThrombus Aging +---------+---------------+---------+-----------+----------+--------------+ CFV      Full           Yes      Yes                                 +---------+---------------+---------+-----------+----------+--------------+ SFJ      Full                                                        +---------+---------------+---------+-----------+----------+--------------+ FV Prox  Full                                                        +---------+---------------+---------+-----------+----------+--------------+ FV Mid   Full                                                         +---------+---------------+---------+-----------+----------+--------------+ FV DistalFull                                                        +---------+---------------+---------+-----------+----------+--------------+ PFV      Full                                                        +---------+---------------+---------+-----------+----------+--------------+ POP      Full           Yes      Yes                                 +---------+---------------+---------+-----------+----------+--------------+ PTV      Full                                                        +---------+---------------+---------+-----------+----------+--------------+ PERO     Full                                                        +---------+---------------+---------+-----------+----------+--------------+  Large, anechoic area noted in the groin.  +---------+---------------+---------+-----------+----------+--------------+ LEFT     CompressibilityPhasicitySpontaneityPropertiesThrombus Aging +---------+---------------+---------+-----------+----------+--------------+ CFV      Full           Yes      Yes                                 +---------+---------------+---------+-----------+----------+--------------+ SFJ      Full                                                        +---------+---------------+---------+-----------+----------+--------------+ FV Prox  Full                                                        +---------+---------------+---------+-----------+----------+--------------+ FV Mid   Full                                                        +---------+---------------+---------+-----------+----------+--------------+ FV DistalFull                                                        +---------+---------------+---------+-----------+----------+--------------+ PFV      Full                                                         +---------+---------------+---------+-----------+----------+--------------+ POP      Full           Yes      Yes                                 +---------+---------------+---------+-----------+----------+--------------+ PTV      Full                                                        +---------+---------------+---------+-----------+----------+--------------+ PERO     Full                                                        +---------+---------------+---------+-----------+----------+--------------+     Summary: RIGHT: - There is no evidence of deep vein thrombosis in the lower extremity.  - No cystic structure found in the popliteal fossa. - Large, anechoic area noted in the groin.  LEFT: - There is no evidence of deep vein thrombosis in the lower extremity.  - No cystic structure found in the popliteal fossa.  *See table(s) above for measurements and observations.    Preliminary      Medications:     Scheduled Medications: . amantadine  100 mg Oral BID  . atorvastatin  80 mg Oral q1800  . chlorhexidine  15 mL Mouth Rinse BID  . clopidogrel  75 mg Oral Q breakfast  . feeding supplement  1 Container Oral BID BM  . insulin aspart  0-15 Units Subcutaneous TID WC  . mouth rinse  15 mL Mouth Rinse q12n4p  . melatonin  3 mg Oral QHS  . potassium chloride  40 mEq Oral Once  . sodium chloride flush  3 mL Intravenous Q12H    Infusions: . sodium chloride Stopped (10/08/19 1620)  . dextrose Stopped (10/11/19 5009)  . pantoprozole (PROTONIX) infusion 8 mg/hr (10/11/19 0449)    PRN Medications: sodium chloride, acetaminophen **OR** acetaminophen, metoprolol tartrate, nitroGLYCERIN, ondansetron (ZOFRAN) IV, polyvinyl alcohol, Resource ThickenUp Clear   Assessment/Plan:   1. CAD with NSTEMI - cath with severe diffuse 3v CAD particularly on left side with severe LV dysfunction. Ideally will need CABG, however in looking at her films worry about the quality of her LAD  target and also not sure she would benefit much from a graft to the RCA. Concerned about her mobility especially after recent fall an groin hematoma and low albumin - s/p PCI of pLAD w/ DES x 1 + PCI of mLCX w/ DES x 1 - CMRI- LVEF 29% RV 37% Global hypokinesis.  - 2D echo 10/02/19 showed improved EF, up to 50-55% - No chest pain.  - continue Plavix and statin. Hold aspirin with GI bleed. Discuss interventional team.   2. Acute systolic HF due to NICM - Initial EF ~25% - repeat echo 3/31, EF normalized, 50-55% - Holding off entresto. Per neurology allow SBP 130-150s  - Add bb soon now that BP more stable   3. R groin hematoma after fall - has hematoma at cath site.  - u/s negative for PSA - CT + hematoma. No mention of fracture but not completely imaged. - Urgent EGD 4/6 showed an actively bleeding duodenal bulb ulcer with visible vessel, treated with epi and hemostasis clips x 5. Hemostasis achieved  - Hold lovenox   4. Hypokalemia/Hypomag K a little low today 3.5  - Mag 1.6  Give 4 grams mag.   5. Severe protein calorie malnutrition  - albumin 2.3  6.  Physical deconditioning - PT/OT/Speech following   7 . Stroke, Acute  -CT 3/28 No acute findings  -CT 3/29 at 0500 R basal infact, small r pontine infact.  -CT 3/30 - Acute infarct centered in the right parietal lobe correlating with perfusion defects yesterday.2. Age-indeterminate infarct at the right caudate that is stable from yesterday. - Improving. Neuro appreciated.  - PT/OT/Speech and CIR following.    8. Hypernatremia Sodium normal today.   9. NSVT -Mag 1.6. Give 4 grams Mag today - Hold bb with CVA/Bleed.   10. Anemia, GI bleed.  H/O gastric ulcers years ago per son.  +FOBT  - - Urgent EGD 4/6 showed an actively bleeding duodenal bulb ulcer with visible vessel, treated with epi and hemostasis clips x 5. Hemostasis achieved  - Receiving protonix drip and will switch to protonix 40 mg IV twice a day later  today. ,   - ASA discontinued. Remains  on Plavix for recently placed coronary DES - Monitor H/H   Discussed with her son at the bedside.   Length of Stay: [place] NP-C  10/11/2019, 11:19 AM  Advanced Heart Failure Team Pager [phone] (M-F; 7a - 4p)  Please contact [facility] for night-coverage after hours (4p -7a ) and weekends on [email or web address]  Patient seen and examined with the above-signed Advanced Practice Provider and/or Housestaff. I personally reviewed laboratory data, imaging studies and relevant notes. I independently examined the patient and formulated the important aspects of the plan. I have edited the note to reflect any of my changes or salient points. I have personally discussed the plan with the patient and/or family.  Denies CP or SOB. No further GI bleeding. Remains on Plavix. Off ASA d/t GI bleed.   Still with marked dysarthria and left-sided weakness on exam. Cor RRR Lungs CTA  SBP 130s. Will add carvedilol 3.125 bid. Per neuro keep SBP 130-150 with tight R MCA stenosis.   We will follow at a distance.   Glori Bickers, MD  2:11 PM

## 2019-10-11 NOTE — Progress Notes (Signed)
Inpatient Rehabilitation-Admissions Coordinator   Noted pt appears to be progressing through her medical workup and GI signed off. As I do not have a bed available for this patient this weekend and family has not yet confirmed a DC plan, I will follow up with them Monday to see if they are able to confirm a solid discharge plan which would allow her to go to CIR.   Cheri Rous, OTR/L  Rehab Admissions Coordinator  [phone] 10/11/2019 1:30 PM

## 2019-10-11 NOTE — Progress Notes (Signed)
Bilateral lower extremity venous duplex has been completed. Preliminary results can be found in CV Proc through chart review.   10/11/19 9:58 AM Olen Cordial RVT

## 2019-10-11 NOTE — Progress Notes (Signed)
PROGRESS NOTE   Brittney Kennedy  PIR:[id number]    DOB: 07-31-1950    DOA: 09/27/2019  PCP: Patient, No Pcp Per   I have briefly reviewed patients previous medical records in [facility].  Chief Complaint:     Brief Narrative:  69 year old female with PMH of chronic systolic CHF and CAD who was admitted from 3/25-3/26 at [facility] for NSTEMI with cath revealing severe multivessel CAD, EF 20%.  She was transferred to [facility] for CHF optimization and CT surgical evaluation for CABG.  Hospital course complicated by fall and right groin hematoma, acute encephalopathy, acute CVA, acute upper GI bleed due to duodenal ulcer s/p EGD 4/6, acute blood loss anemia requiring blood transfusions.  Cardiology, neurology, [facility] on board.  Care transferred to [facility] on 4/7.   Assessment & Plan:  Principal Problem:   NSTEMI (non-ST elevated myocardial infarction) [facility]) Active Problems:   Acute systolic CHF (congestive heart failure) (HCC)   Cerebral embolism with cerebral infarction   Malnutrition of moderate degree   Upper GI bleed   Status post coronary artery stent placement   Acute blood loss anemia   Duodenal ulcer   CAD with NSTEMI: Cath 3/26 with severe diffuse three-vessel CAD, EF 20%.  Transferred from [facility] to [facility] for CABG evaluation but not felt to be an appropriate candidate.  S/p PCI of proximal LAD with DES x1 and PCI of mid circumflex with DES x1 on 3/29.  TTE 10/02/2019 showed improved EF up to 50-55%.  No anginal symptoms.  Aspirin on hold due to GI bleed.  Continuing Plavix and statins.  Cardiology following and recommend adding carvedilol 3.125 mg twice daily.  Acute systolic CHF due to NICM: Initial LVEF 25%.  Repeat TTE 3/31 showed LVEF has normalized to 50-55%.  Cardiology follow-up appreciated, no Entresto at this time.  Right groin hematoma sustained status post fall: Had hematoma at cath site.  Ultrasound negative for pseudoaneurysm.  CT positive for hematoma.  Site appears stable.   Does not seem to be the cause for blood loss anemia at this time.  Holding Lovenox.  Hypokalemia and hypomagnesemia: Replaced.  Severe protein calorie malnutrition: Advance diet as tolerated and based on stability of GI bleed.  Starting dysphagia 1 and nectar thickened diet on 4/8.  Physical deconditioning: Therapies to continue to work with her.  Likely CIR when stabilized, CIR will reassess early next week.  Acute MCA stroke with left hemiplegia: CT head 3/28 without acute findings.  CT head 3/29 with right basilar infarct, small right pontine infarct.  CT head 3/30: Acute infarct centered in the right parietal lobe.  Mental status changes improving.  Still has dysarthria and aphasia.  Currently on nectar thickened liquids, when able advance to dysphagia 1 diet.  DC to CIR when able.  Remains on Plavix.  Repeat MRI brain 4/7: Large infarct in the posterior right MCA territory, including confluent ischemia in the deep right MCA white matter, and the splenium of the corpus callosum.  Aside from the splenium, these areas were abnormal on CTP last week.  There is also evidence of patchy infarcts in the deep left MCA white matter, not evident last week.  No significant intracranial mass-effect. Dr. Roda Shutters, neurology aware of these results.  Hypernatremia: Sodium had peaked to 148.  Treated briefly with IV D5 infusion.  Resolved.  Discontinue IV D5.  NSVT: As per cardiology, start carvedilol 3.125 twice daily.  Hypomagnesemia and hypokalemia replaced.  Acute upper GI bleed due to  duodenal ulcer: S/p urgent EGD 4/6 which showed duodenal bulb ulcer with visible blood vessel, endoclips x5 placed.  Had a smidge of old blood PR in the last 48 hours but no large melena or rectal bleed.  [facility] following, discussed with Dr. Adela Lank.  Aspirin discontinued but remains on Plavix due to recently placed coronary DES.  Completed 72 hours of IV PPI infusion.  As discussed with Dr. Adela Lank, change IV Protonix to 40  mg twice daily while hospitalized then can change to oral at discharge and outpatient follow-up with [facility].  Advance diet.  No overt bleeding.  Acute blood loss anemia: Secondary to GI bleed.  Has been transfused total of 3 units PRBCs thus far, last transfusion on 4/7.  Hemoglobin has stabilized in the 8.7 range.  Follow CBC daily.  Leukocytosis: Likely stress response.  No overt signs or symptoms of infection.  Urine culture positive but no urinary symptoms.  Unless develops fever or clear signs and symptoms of infection, hold off antibiotics.  WBC down to 14 K.  Acute encephalopathy: Likely multifactorial related to recent acute stroke, medications and others.  Improving.  Dysphagia: Advance to dysphagia 1 nectar thickened diet today.  Prediabetes: A1c 5.9 on 3/25.  SSI.  Right eye discomfort: No overt acute findings.?  Dry eyes.  Trial of artificial tears.  Insomnia: Patient takes melatonin 10 mg at bedtime at night.  Trial of 3 mg at bedtime here son agreeable  Body mass index is 29.15 kg/m.  Nutritional Status Nutrition Problem: Moderate Malnutrition Etiology: acute illness Signs/Symptoms: mild muscle depletion, mild fat depletion Interventions: Tube feeding  DVT prophylaxis: SCDs Code Status: Full Family Communication: Discussed in detail with patient's son again today at bedside, updated care and answered questions.  Disposition:  . Patient came from: Home           . Anticipated d/c place: CIR . Barriers to d/c: Recent acute upper GI bleed due to bleeding duodenal ulcer, acute blood loss anemia that requires close monitoring and intervention.   Consultants:   Cardiology Neurology [facility] MD  Procedures:   EGD 4/6  Antimicrobials:   None   Subjective:  Patient interviewed and examined along with RN and patient's son at bedside.  Some right eye discomfort/itching.  No pain.  Did not sleep well last night.  No significant BM over the last 3 days.   Specifically no melena or bleeding.  Objective:   Vitals:   10/11/19 0738 10/11/19 0825 10/11/19 1255 10/11/19 1622  BP: 136/62  (!) 161/64 (!) 87/63  Pulse: 84  89 92  Resp: 16 17 17 17   Temp: 98.2 F (36.8 C)  98.7 F (37.1 C) 99 F (37.2 C)  TempSrc: Oral  Oral Oral  SpO2: 100%  100% 100%  Weight:      Height:        General exam: Middle-aged female, moderately built and overweight lying comfortably propped up in bed without distress.  On room air. Respiratory system: Clear to auscultation.  No increased work of breathing. Cardiovascular system: S1 and S2 heard, RRR.  No JVD, murmurs or pedal edema.  Telemetry personally reviewed: Sinus rhythm. Gastrointestinal system: Abdomen is nondistended, soft and nontender. No organomegaly or masses felt. Normal bowel sounds heard.  Right groin groin and upper thigh subacute bruising, improving per son. Central nervous system: Alert and oriented x2.  Severe dysarthria.  Facial asymmetry +. Extremities: Right upper extremity 5 x 5 power, right lower extremity  at least grade 4 x 5 power.  Left extremities grade 1 x 5 power. Skin: No rashes, lesions or ulcers Psychiatry: Judgement and insight impaired. Mood & affect cannot be assessed at this time. Right eye: Faint conjunctival redness but otherwise no acute findings.    Data Reviewed:   I have personally reviewed following labs and imaging studies   CBC: Recent Labs  Lab 10/10/19 0811 10/10/19 1821 10/11/19 0406  WBC 14.3* 13.3* 15.1*  HGB 8.0* 8.7* 8.7*  HCT 26.0* 27.8* 28.2*  MCV 96.7 98.6 97.9  PLT 208 209 198    Basic Metabolic Panel: Recent Labs  Lab 10/05/19 0350 10/05/19 0350 10/06/19 0439 10/07/19 0446 10/09/19 0428 10/10/19 0811 10/11/19 0406  NA 153*   < > 148*   < > 148* 147* 141  K 3.8   < > 3.5   < > 4.3 3.6 3.5  CL 116*   < > 113*   < > 117* 114* 110  CO2 23   < > 24   < > 22 23 22   GLUCOSE 128*   < > 123*   < > 143* 118* 126*  BUN 17   < > 14   < >  50* 33* 23  CREATININE 1.14*   < > 1.01*   < > 1.17* 1.12* 1.02*  CALCIUM 9.4   < > 9.3   < > 8.5* 8.6* 8.6*  MG 1.8   < > 1.7   < > 2.2 1.9 1.6*  PHOS 3.6  --  2.2*  --   --   --   --    < > = values in this interval not displayed.    Liver Function Tests: No results for input(s): AST, ALT, ALKPHOS, BILITOT, PROT, ALBUMIN in the last 168 hours.  CBG: Recent Labs  Lab 10/11/19 0608 10/11/19 1253 10/11/19 1638  GLUCAP 106* 139* 155*    Microbiology Studies:   Recent Results (from the past 240 hour(s))  Culture, Urine     Status: Abnormal   Collection Time: 10/04/19  2:50 AM   Specimen: Urine, Clean Catch  Result Value Ref Range Status   Specimen Description URINE, CLEAN CATCH  Final   Special Requests   Final    NONE Performed at [facility], [address] [place], [place] [address]    Culture >=100,000 COLONIES/mL AEROCOCCUS VIRIDANS (A)  Final   Report Status 10/06/2019 FINAL  Final     Radiology Studies:  VAS [place] LOWER EXTREMITY VENOUS (DVT)  Result Date: 10/11/2019  Lower Venous DVTStudy Indications: Stroke.  Risk Factors: None identified. Limitations: Poor ultrasound/tissue interface and patient positioning. Comparison Study: No prior studies. Performing Technologist: Chanda Busing RVT  Examination Guidelines: A complete evaluation includes B-mode imaging, spectral Doppler, color Doppler, and power Doppler as needed of all accessible portions of each vessel. Bilateral testing is considered an integral part of a complete examination. Limited examinations for reoccurring indications may be performed as noted. The reflux portion of the exam is performed with the patient in reverse Trendelenburg.  +---------+---------------+---------+-----------+----------+--------------+ RIGHT    CompressibilityPhasicitySpontaneityPropertiesThrombus Aging +---------+---------------+---------+-----------+----------+--------------+ CFV      Full           Yes      Yes                                  +---------+---------------+---------+-----------+----------+--------------+ SFJ  Full                                                        +---------+---------------+---------+-----------+----------+--------------+ FV Prox  Full                                                        +---------+---------------+---------+-----------+----------+--------------+ FV Mid   Full                                                        +---------+---------------+---------+-----------+----------+--------------+ FV DistalFull                                                        +---------+---------------+---------+-----------+----------+--------------+ PFV      Full                                                        +---------+---------------+---------+-----------+----------+--------------+ POP      Full           Yes      Yes                                 +---------+---------------+---------+-----------+----------+--------------+ PTV      Full                                                        +---------+---------------+---------+-----------+----------+--------------+ PERO     Full                                                        +---------+---------------+---------+-----------+----------+--------------+ Large, anechoic area noted in the groin.  +---------+---------------+---------+-----------+----------+--------------+ LEFT     CompressibilityPhasicitySpontaneityPropertiesThrombus Aging +---------+---------------+---------+-----------+----------+--------------+ CFV      Full           Yes      Yes                                 +---------+---------------+---------+-----------+----------+--------------+ SFJ      Full                                                        +---------+---------------+---------+-----------+----------+--------------+  FV Prox  Full                                                         +---------+---------------+---------+-----------+----------+--------------+ FV Mid   Full                                                        +---------+---------------+---------+-----------+----------+--------------+ FV DistalFull                                                        +---------+---------------+---------+-----------+----------+--------------+ PFV      Full                                                        +---------+---------------+---------+-----------+----------+--------------+ POP      Full           Yes      Yes                                 +---------+---------------+---------+-----------+----------+--------------+ PTV      Full                                                        +---------+---------------+---------+-----------+----------+--------------+ PERO     Full                                                        +---------+---------------+---------+-----------+----------+--------------+     Summary: RIGHT: - There is no evidence of deep vein thrombosis in the lower extremity.  - No cystic structure found in the popliteal fossa. - Large, anechoic area noted in the groin.  LEFT: - There is no evidence of deep vein thrombosis in the lower extremity.  - No cystic structure found in the popliteal fossa.  *See table(s) above for measurements and observations.    Preliminary      Scheduled Meds:   . amantadine  100 mg Oral BID  . atorvastatin  80 mg Oral q1800  . chlorhexidine  15 mL Mouth Rinse BID  . clopidogrel  75 mg Oral Q breakfast  . feeding supplement  1 Container Oral BID BM  . insulin aspart  0-15 Units Subcutaneous TID WC  . mouth rinse  15 mL Mouth Rinse q12n4p  . melatonin  3 mg Oral QHS  . sodium chloride flush  3 mL Intravenous Q12H    Continuous Infusions:   . sodium chloride Stopped (  10/08/19 1620)     LOS: 14 days     Marcellus Scott, MD, [place], [facility]. [facility]     To contact the attending provider between 7A-7P or the covering provider during after hours 7P-7A, please log into the web site [email or web address] and access using universal [place] password for that web site. If you do not have the password, please call the hospital operator.  10/11/2019, 6:40 PM

## 2019-10-11 NOTE — Progress Notes (Signed)
      Progress Note   Subjective  Patient tolerating diet, no blood in the stools. Much more alert this AM. No pain.   Objective   Vital signs in last 24 hours: Temp:  [98 F (36.7 C)-100.2 F (37.9 C)] 98.2 F (36.8 C) (04/09 0738) Pulse Rate:  [80-87] 84 (04/09 0738) Resp:  [15-20] 17 (04/09 0825) BP: (128-155)/(50-88) 136/62 (04/09 0738) SpO2:  [97 %-100 %] 100 % (04/09 0738) Last BM Date: 10/09/19 General:    white female in NAD Abdomen:  Soft, nondistended.   Intake/Output from previous day: 04/08 0701 - 04/09 0700 In: 1740 [P.O.:270; I.V.:1470] Out: 1125 [Urine:1125] Intake/Output this shift: Total I/O In: 123 [P.O.:120; I.V.:3] Out: 300 [Urine:300]  Lab Results: Recent Labs    10/10/19 0811 10/10/19 1821 10/11/19 0406  WBC 14.3* 13.3* 15.1*  HGB 8.0* 8.7* 8.7*  HCT 26.0* 27.8* 28.2*  PLT 208 209 198   BMET Recent Labs    10/09/19 0428 10/10/19 0811 10/11/19 0406  NA 148* 147* 141  K 4.3 3.6 3.5  CL 117* 114* 110  CO2 22 23 22   GLUCOSE 143* 118* 126*  BUN 50* 33* 23  CREATININE 1.17* 1.12* 1.02*  CALCIUM 8.5* 8.6* 8.6*   LFT No results for input(s): PROT, ALBUMIN, AST, ALT, ALKPHOS, BILITOT, BILIDIR, IBILI in the last 72 hours. PT/INR No results for input(s): LABPROT, INR in the last 72 hours.  Studies/Results:     Assessment / Plan:    69 y/o female with history of NSTEMI s/p coronary stenting, CVA, on Plavix / aspirin and developed a severe upper GI bleed. Urgent EGD 4/6 showed an actively bleeding duodenal bulb ulcer with visible vessel, treated with epi and hemostasis clips x 5. Hemostasis achieved at the time.   Hgb has fluctuated in recent days but stable last 24 hours. BUN downtrending nicely, no further overt blood loos. We have continued Plavix given recent coronary stenting. H pylori IgG serology negative.  At this time continue IV Protonix for full 72 hours post procedure (due to stop today) and then 40mg  protonix IV until she  is discharged, at which time she can be transitioned to 40mg  protonix PO BID until her follow up with 6/6 as outpatient which we will coordinate.  Can advance diet as tolerated per speech path recs. Otherwise will sign off for now, please call with questions moving forward. Plan discussed with son who agreed.  , MD [facility]

## 2019-10-11 NOTE — Progress Notes (Signed)
Noticed patient was rubbing right eye often and was looking irritated. She stated "dust bothers me". Advised patient not to rub and placed a wet wash cloth on it. Will continue to monitor [facility] RN

## 2019-10-12 DIAGNOSIS — E876 Hypokalemia: Secondary | ICD-10-CM

## 2019-10-12 DIAGNOSIS — H16201 Unspecified keratoconjunctivitis, right eye: Secondary | ICD-10-CM

## 2019-10-12 DIAGNOSIS — N3000 Acute cystitis without hematuria: Secondary | ICD-10-CM

## 2019-10-12 LAB — BASIC METABOLIC PANEL
Anion gap: 11 (ref 5–15)
BUN: 17 mg/dL (ref 8–23)
CO2: 21 mmol/L — ABNORMAL LOW (ref 22–32)
Calcium: 8.7 mg/dL — ABNORMAL LOW (ref 8.9–10.3)
Chloride: 110 mmol/L (ref 98–111)
Creatinine, Ser: 0.91 mg/dL (ref 0.44–1.00)
GFR calc Af Amer: >60 mL/min (ref >60)
GFR calc non Af Amer: >60 mL/min (ref >60)
Glucose, Bld: 127 mg/dL — ABNORMAL HIGH (ref 70–99)
Potassium: 3.3 mmol/L — ABNORMAL LOW (ref 3.5–5.1)
Sodium: 142 mmol/L (ref 135–145)

## 2019-10-12 LAB — CBC
HCT: 26.7 % — ABNORMAL LOW (ref 36.0–46.0)
Hemoglobin: 8.5 g/dL — ABNORMAL LOW (ref 12.0–15.0)
MCH: 30.9 pg (ref 26.0–34.0)
MCHC: 31.8 g/dL (ref 30.0–36.0)
MCV: 97.1 fL (ref 80.0–100.0)
Platelets: 206 10*3/uL (ref 150–400)
RBC: 2.75 MIL/uL — ABNORMAL LOW (ref 3.87–5.11)
RDW: 18.5 % — ABNORMAL HIGH (ref 11.5–15.5)
WBC: 11.8 10*3/uL — ABNORMAL HIGH (ref 4.0–10.5)
nRBC: 0 % (ref 0.0–0.2)

## 2019-10-12 LAB — MAGNESIUM: Magnesium: 1.7 mg/dL (ref 1.7–2.4)

## 2019-10-12 LAB — TYPE AND SCREEN
ABO/RH(D): A POS
Antibody Screen: NEGATIVE
Unit division: 0
Unit division: 0
Unit division: 0

## 2019-10-12 LAB — BPAM RBC
Blood Product Expiration Date: 202104302359
Blood Product Expiration Date: 202105012359
Blood Product Expiration Date: 202105022359
ISSUE DATE / TIME: 202104052020
ISSUE DATE / TIME: 202104061150
ISSUE DATE / TIME: 202104070915
Unit Type and Rh: 6200
Unit Type and Rh: 6200
Unit Type and Rh: 6200

## 2019-10-12 LAB — GLUCOSE, CAPILLARY
Glucose-Capillary: 104 mg/dL — ABNORMAL HIGH (ref 70–99)
Glucose-Capillary: 124 mg/dL — ABNORMAL HIGH (ref 70–99)
Glucose-Capillary: 165 mg/dL — ABNORMAL HIGH (ref 70–99)
Glucose-Capillary: 133 mg/dL — ABNORMAL HIGH (ref 70–99)
Glucose-Capillary: 108 mg/dL — ABNORMAL HIGH (ref 70–99)

## 2019-10-12 MED ORDER — AMOXICILLIN 500 MG PO CAPS
500.0000 mg | ORAL_CAPSULE | Freq: Three times a day (TID) | ORAL | Status: DC
  Administered 2019-10-12 – 2019-10-16 (×13): 500 mg via ORAL
  Filled 2019-10-12 (×13): qty 1

## 2019-10-12 MED ORDER — POTASSIUM CHLORIDE CRYS ER 20 MEQ PO TBCR
40.0000 meq | EXTENDED_RELEASE_TABLET | ORAL | Status: AC
  Administered 2019-10-12 (×2): 40 meq via ORAL
  Filled 2019-10-12 (×2): qty 2

## 2019-10-12 MED ORDER — DOCUSATE SODIUM 50 MG/5ML PO LIQD
50.0000 mg | Freq: Two times a day (BID) | ORAL | Status: DC
  Administered 2019-10-12 – 2019-10-13 (×2): 50 mg via ORAL
  Filled 2019-10-12 (×2): qty 10

## 2019-10-12 MED ORDER — ERYTHROMYCIN 5 MG/GM OP OINT
TOPICAL_OINTMENT | Freq: Four times a day (QID) | OPHTHALMIC | Status: DC
  Filled 2019-10-12: qty 3.5

## 2019-10-12 MED ORDER — MAGNESIUM SULFATE 4 GM/100ML IV SOLN
4.0000 g | Freq: Once | INTRAVENOUS | Status: AC
  Administered 2019-10-12: 4 g via INTRAVENOUS
  Filled 2019-10-12: qty 100

## 2019-10-12 NOTE — Consult Note (Signed)
CC: redness, irritation, pain OD  HPI: 69 yo F admittted for NSTEMI, with subsequent fall, AMS, CVA. Primary team has noted pt having red eye for the last 1-2 days with redness. Patient has been frequently itching the OD and has expressed pain in her OD. She is unable to describe further history due to aphasia and AMS.   ROS unable to obtain  Patient Active Problem List   Diagnosis Date Noted  . Duodenal ulcer   . Acute blood loss anemia 10/08/2019  . Upper GI bleed   . Status post coronary artery stent placement   . Malnutrition of moderate degree 10/02/2019  . Cerebral embolism with cerebral infarction 10/01/2019  . CAD (coronary artery disease) 09/27/2019  . Acute systolic CHF (congestive heart failure) (HCC) 09/27/2019  . NSTEMI (non-ST elevated myocardial infarction) (HCC) 09/26/2019   No current facility-administered medications on file prior to encounter.   Current Outpatient Medications on File Prior to Encounter  Medication Sig Dispense Refill  . acetaminophen (TYLENOL) 500 MG tablet Take 500 mg by mouth every 6 (six) hours as needed for mild pain.    Marland Kitchen acetaminophen (TYLENOL) 650 MG CR tablet Take 1,300 mg by mouth in the morning and at bedtime.    . B Complex-C (B-COMPLEX WITH VITAMIN C) tablet Take 1 tablet by mouth daily.    Marland Kitchen Bioflavonoid Products (ESTER C PO) Take 1 tablet by mouth daily.    . Biotin w/ Vitamins C & E (HAIR/SKIN/NAILS PO) Take 3 capsules by mouth daily.    . Calcium Carbonate-Vit D-Min (CALCIUM-VITAMIN D-MINERALS) 600-800 MG-UNIT CHEW Chew by mouth.    . diphenhydrAMINE (BENADRYL) 25 MG tablet Take 50 mg by mouth at bedtime.    Marland Kitchen DM-Doxylamine-Acetaminophen (VICKS NYQUIL COLD & FLU PO) Take 2 capsules by mouth at bedtime. For allergies    . DM-Phenylephrine-Acetaminophen (VICKS DAYQUIL COLD & FLU) 10-5-325 MG CAPS Take 2 capsules by mouth daily. For allergies    . docusate sodium (COLACE) 100 MG capsule Take 100 mg by mouth 2 (two) times daily.    .  famotidine-calcium carbonate-magnesium hydroxide (PEPCID COMPLETE) 10-800-165 MG chewable tablet Chew 1 tablet by mouth daily as needed (acid reflux).    . Ginkgo Biloba 120 MG CAPS Take 120 mg by mouth daily.    Marland Kitchen glucosamine-chondroitin 500-400 MG tablet Take 2 tablets by mouth daily.    . hydrocortisone cream 1 % Apply 1 application topically 2 (two) times daily as needed for itching.    . Melatonin 10 MG CAPS Take 20 mg by mouth at bedtime.    . Multiple Vitamin (MULTIVITAMIN WITH MINERALS) TABS tablet Take 1 tablet by mouth daily.    Marland Kitchen neomycin-bacitracin-polymyxin (NEOSPORIN) [phone] ointment Apply 1 application topically 3 (three) times daily as needed (wound care).    Marland Kitchen omeprazole (PRILOSEC) 20 MG capsule Take 20 mg by mouth daily.    Marland Kitchen OVER THE COUNTER MEDICATION Take 1 capsule by mouth daily. Echinacea & Goldenseal    . phenylephrine (SUDAFED PE) 10 MG TABS tablet Take 10 mg by mouth daily.     . psyllium (REGULOID) 0.52 g capsule Take 1.04 g by mouth daily.    . ranitidine (ZANTAC) 150 MG capsule Take 150 mg by mouth 2 (two) times daily as needed for heartburn.    . Simethicone 125 MG TABS Take 250 mg by mouth daily.     Allergies  Allergen Reactions  . Onion Anaphylaxis and Hives  . Monosodium Glutamate Nausea Only  Headache Fatigue    Examination:  VAcc(near) Patient unable to participate or vocalize responses due to mental status, though she does nod when asked if she can see my fingers and the top of the eye chart (20/800) with each eye isolated.  Pupils equal, round, reactive, no APD OU  T(pen) OD 14 Mm Hg OS  13 Mm Hg  EOM: pt unable to participate CVF: pt unable to participate  Anterior segment exam: with penlight and 20D lens Ext/Lids: R mild eyelid edema, mild mattering around lashes, no periocular dermatitis, L wnl Conj/sclera: OD 1+ conj injection without any chemosis, OS white/quiet  Cornea: OD no infiltrate, no frank abrasion, but 2-3+ punctate  staining inferiorly and centrally, no dendrite OS clear without abrasion or infiltrate AC: deep and quiet OU, no hyphema OU Iris: round and flat OU, no sphincter tears OU Lens: clear OU  Dilation with phenylephrine and tropicamide OU @ 1:11PM  DFE Vitreous: clear OU Disc: pink and sharp OU, OD c/d 0.5, OS c/d 0.6, no disc hemorrhages Macula: OD flat/dry, OS central flat/dry, with circular area of retinal pigment epithelium atrophy approximately 1-5/2 disc diameters in the inferior macula. No subretinal hemorrhages. Vessels: attenuated OU, numerous cotton wool spots along the vascular arcades OU Periphery: flat and attached 360 OU, no retinal tears/detachments OU  IMP/PLAN: 1. Keratoconjunctivitis OD - viral vs bacterial etiology, would treat with erythromycin ophthalmic 0.5% QID OD for bacterial coverage and also for comfort of her keratitis for at least 7 days. Eyelids can be cleaned with warm compress as needed.  2. Hypertensive retinopathy OU - multiple cotton wool spots and attenuated retinal vasculature OU - recommend continue management of hypertension and vascular risk factor modification  3. Retinal pigment epithelium atrophy OS - atypical macular degeneration versus prior retinal scar or pattern dystrophy (less likely as unilateral) - no retinal hemorrhage, no signs of activity, no treatment recommended for this other than routine ophthalmic follow-up   Lonia Skinner, MD Ophthalmology [facility]

## 2019-10-12 NOTE — Plan of Care (Signed)
  Problem: Self-Care: Goal: Ability to communicate needs accurately will improve Outcome: Progressing   Problem: Nutrition: Goal: Risk of aspiration will decrease Outcome: Progressing   

## 2019-10-12 NOTE — Progress Notes (Signed)
Physical Therapy Treatment Patient Details Name: Brittney Kennedy MRN: [id number] DOB: Dec 08, 1950 Today's Date: 10/12/2019    History of Present Illness 69 year old female to the hospital for nausea, vomiting, epigastric pain x 1 week, admitted 09/27/19 for NSTEMI. cath 3/26 revealed severe multivessel CAD. Patient transferred to [facility] Fall 3/27 with R groin hematoma. 3/28 patient with AMS due to pain meds and rapid response 3/29 due to AMS and L facial droop with weakness. CT: R basal ganglia infarct, MRI on 10/09/19 reveals large R MCA infarct. PMH: CAD, cholecystectomy    PT Comments    Pt supine in bed on arrival.  She is lying flat with LUE supported by pillow.  Focused on sitting balance x 20 min with cues for righting and weight shifting as well as upper trunk and head control.  Transfers remain to be challenging due to patient alertness and low tone on L side.  Continue to recommend CIR therapies to maximize functional gains before return home.     Follow Up Recommendations  CIR     Equipment Recommendations  Other (comment)(TBD)    Recommendations for Other Services       Precautions / Restrictions Precautions Precautions: Fall Precaution Comments: aspiration precautions (dysphagia 1 with nectar), HOB >30 degrees Required Braces or Orthoses: Other Brace Other Brace: L sling to prevent further sublaxation, appears to small. Restrictions Weight Bearing Restrictions: No    Mobility  Bed Mobility Overal bed mobility: Needs Assistance Bed Mobility: Rolling;Sidelying to Sit Rolling: Max assist Sidelying to sit: +2 for physical assistance;Mod assist       General bed mobility comments: Pt required assistance for LE advancement and trunk elevation.  Pt utilized RUE to move into a seated position.  Transfers Overall transfer level: Needs assistance Equipment used: Ambulation equipment used(sara stedy) Transfers: Sit to/from Stand Sit to Stand: Max assist;+2 physical  assistance         General transfer comment: Utilized bed pad to boost into standing. LUE sling utilized to protect low tone.  Pt with intermittent cueing to maintain trunk control duringv transfer activities as she continually flexes forward.  Ambulation/Gait Ambulation/Gait assistance: (unable.)               Stairs             Wheelchair Mobility    Modified Rankin (Stroke Patients Only) Modified Rankin (Stroke Patients Only) Pre-Morbid Rankin Score: No symptoms Modified Rankin: Severe disability     Balance Overall balance assessment: Needs assistance Sitting-balance support: Single extremity supported;Feet supported Sitting balance-Leahy Scale: Fair       Standing balance-Leahy Scale: Poor Standing balance comment: x2 in stedy with flexed posture.                            Cognition Arousal/Alertness: Lethargic Behavior During Therapy: Flat affect Overall Cognitive Status: Difficult to assess Area of Impairment: Attention;Orientation;Memory;Following commands;Safety/judgement                   Current Attention Level: Sustained Memory: Decreased short-term memory Following Commands: Follows one step commands with increased time Safety/Judgement: Decreased awareness of safety;Decreased awareness of deficits Awareness: Emergent Problem Solving: Slow processing General Comments: Increased cues to remain focused on task at hand.      Exercises      General Comments        Pertinent Vitals/Pain Pain Assessment: Faces Faces Pain Scale: Hurts little more Pain Location: R hip  Pain Descriptors / Indicators: Grimacing;Discomfort Pain Intervention(s): Monitored during session;Repositioned;Premedicated before session    Home Living                      Prior Function            PT Goals (current goals can now be found in the care plan section) Acute Rehab PT Goals Patient Stated Goal: get some ice Potential to  Achieve Goals: Good Progress towards PT goals: Progressing toward goals    Frequency    Min 4X/week      PT Plan Current plan remains appropriate    Co-evaluation              AM-PAC PT "6 Clicks" Mobility   Outcome Measure  Help needed turning from your back to your side while in a flat bed without using bedrails?: A Lot Help needed moving from lying on your back to sitting on the side of a flat bed without using bedrails?: Total Help needed moving to and from a bed to a chair (including a wheelchair)?: Total Help needed standing up from a chair using your arms (e.g., wheelchair or bedside chair)?: Total Help needed to walk in hospital room?: Total Help needed climbing 3-5 steps with a railing? : Total 6 Click Score: 7    End of Session Equipment Utilized During Treatment: Gait belt Activity Tolerance: Patient limited by fatigue;Patient tolerated treatment well Patient left: in chair;with call bell/phone within reach;with chair alarm set;with family/visitor present;Other (comment) Nurse Communication: Mobility status PT Visit Diagnosis: Other abnormalities of gait and mobility (R26.89);Muscle weakness (generalized) (M62.81);Hemiplegia and hemiparesis;Other symptoms and signs involving the nervous system (R29.898) Hemiplegia - Right/Left: Left Hemiplegia - dominant/non-dominant: Non-dominant Hemiplegia - caused by: Cerebral infarction     Time: 3235-5732 PT Time Calculation (min) (ACUTE ONLY): 31 min  Charges:  $Therapeutic Activity: 23-37 mins                     Erasmo Leventhal , PTA Acute Rehabilitation Services Pager [phone] Office [phone]     Geral Tuch Eli Hose 10/12/2019, 1:13 PM

## 2019-10-12 NOTE — Progress Notes (Signed)
Telemetry monitor called to notify nurse, pt had run of Vtach. MD notified. Pt is asymptomatic. Will continue to monitor.

## 2019-10-12 NOTE — Progress Notes (Signed)
PROGRESS NOTE   Brittney Kennedy  JKK:[id number]    DOB: May 07, 1951    DOA: 09/27/2019  PCP: Patient, No Pcp Per   I have briefly reviewed patients previous medical [facility].  Chief Complaint:     Brief Narrative:  69 year old female with PMH of chronic systolic CHF and CAD who was admitted from 3/25-3/26 at [facility] for NSTEMI with cath revealing severe multivessel CAD, EF 20%.  She was transferred to [facility] for CHF optimization and CT surgical evaluation for CABG.  Hospital course complicated by fall and right groin hematoma, acute encephalopathy, acute CVA, acute upper GI bleed due to duodenal ulcer s/p EGD 4/6, acute blood loss anemia requiring blood transfusions.  Cardiology, neurology, [facility] on board.  Care transferred to [facility] on 4/7.  GI bleed resolved.  Anemia stable.   Assessment & Plan:  Principal Problem:   NSTEMI (non-ST elevated myocardial infarction) [facility]) Active Problems:   Acute systolic CHF (congestive heart failure) (HCC)   Cerebral embolism with cerebral infarction   Malnutrition of moderate degree   Upper GI bleed   Status post coronary artery stent placement   Acute blood loss anemia   Duodenal ulcer   CAD with NSTEMI: Cath 3/26 with severe diffuse three-vessel CAD, EF 20%.  Transferred from [facility] to [facility] for CABG evaluation but not felt to be an appropriate candidate.  S/p PCI of proximal LAD with DES x1 and PCI of mid circumflex with DES x1 on 3/29.  TTE 10/02/2019 showed improved EF up to 50-55%.  No anginal symptoms.  Aspirin on hold due to GI bleed.  Continuing carvedilol, Plavix and statins.  Cardiology will follow peripherally.  Acute systolic CHF due to NICM: Initial LVEF 25%.  Repeat TTE 3/31 showed LVEF has normalized to 50-55%.  Apart from trace ankle edema, appears euvolemic.  Right groin hematoma sustained status post fall: Had hematoma at cath site.  Ultrasound negative for pseudoaneurysm.  CT positive for hematoma.  Site appears stable.   Does not seem to be the cause for blood loss anemia at this time.  No Lovenox due to recent large upper GI bleed from duodenal ulcer  Hypokalemia and hypomagnesemia: Replaced.  Severe protein calorie malnutrition: Advance diet as tolerated and based on stability of GI bleed.  Starting dysphagia 1 and nectar thickened diet on 4/8.  Physical deconditioning: Therapies to continue to work with her.  Likely CIR when stabilized, CIR will reassess early next week.  Acute MCA stroke with left hemiplegia: CT head 3/28 without acute findings.  CT head 3/29 with right basilar infarct, small right pontine infarct.  CT head 3/30: Acute infarct centered in the right parietal lobe.  Mental status changes improving.  Still has dysarthria and aphasia.  Currently on nectar thickened liquids, when able advance to dysphagia 1 diet.  DC to CIR when able.  Remains on Plavix.  Repeat MRI brain 4/7: Large infarct in the posterior right MCA territory, including confluent ischemia in the deep right MCA white matter, and the splenium of the corpus callosum.  Aside from the splenium, these areas were abnormal on CTP last week.  There is also evidence of patchy infarcts in the deep left MCA white matter, not evident last week.  No significant intracranial mass-effect.   Hypernatremia: Sodium had peaked to 148.  Treated briefly with IV D5 infusion.  Resolved.  Discontinue IV D5.  NSVT: Continue carvedilol 3.125 twice daily.  Replace hypomagnesemia and hypokalemia.  Acute upper GI bleed  due to duodenal ulcer: S/p urgent EGD 4/6 which showed duodenal bulb ulcer with visible blood vessel, endoclips x5 placed.  Had a smidge of old blood PR in the last 48 hours but no large melena or rectal bleed.  [facility] following, discussed with Dr. Havery Moros.  Aspirin discontinued but remains on Plavix due to recently placed coronary DES.  Completed 72 hours of IV PPI infusion.  As discussed with Dr. Havery Moros, changed IV Protonix to 40 mg  twice daily while hospitalized then can change to oral at discharge and outpatient follow-up with [facility].  Advance diet.  GI bleeding resolved..  Acute blood loss anemia: Secondary to GI bleed.  Has been transfused total of 3 units PRBCs thus far, last transfusion on 4/7.  Hemoglobin has stabilized in the 8.7-8.5 range.  Follow CBC daily.  Leukocytosis: Likely stress response.  No overt signs or symptoms of infection.  Urine culture positive but no urinary symptoms.  Unless develops fever or clear signs and symptoms of infection, hold off antibiotics.  WBC down to 11 K  Acute encephalopathy: Likely multifactorial related to recent acute stroke, medications and others.  Improving.  Dysphagia: Continue dysphagia 1 diet and nectar thickened liquids  Prediabetes: A1c 5.9 on 3/25.  SSI.  Right eye keratoconjunctivitis: Treated yesterday with artificial tears.  Worsened symptoms today.  Ophthalmology consulted.  Suspecting viral versus bacterial etiology and treating with erythromycin eyedrops for bacterial coverage and also for comfort of her keratitis for at least 7 days.  Warm compress to the eyelids.  Insomnia: Patient takes melatonin 10 mg at bedtime at night.  Trial of 3 mg at bedtime here son agreeable, did not help but son does not want to increase dose or try other medications at this time.  Body mass index is 28.99 kg/m.  Nutritional Status Nutrition Problem: Moderate Malnutrition Etiology: acute illness Signs/Symptoms: mild muscle depletion, mild fat depletion Interventions: Tube feeding   Aerococcus viridans UTI: Although patient has no symptoms of UTI, she is not a great historian given aphasia and dysarthria, no fevers but had mild leukocytosis.  Discussed in detail with infectious disease MD on call who recommended treating with amoxicillin x5 days.  Son agreeable.  DVT prophylaxis: SCDs Code Status: Full Family Communication: Discussed again in detail with patient's son at  bedside, updated care and answered questions Disposition:  . Patient came from: Home           . Anticipated d/c place: CIR . Barriers to d/c: Gradual progress from recent complex hospital admission.  Hopefully to CIR next week.   Consultants:   Cardiology Neurology [facility] MD Ophthalmology.  Procedures:   EGD 4/6  Antimicrobials:   None   Subjective:  Patient interviewed and examined along with her RN and patient's son in room.  Right eye painful, unable to open.  No chest pain or dyspnea.  No BM in the last 4 days.  Objective:   Vitals:   10/12/19 1527 10/12/19 1533 10/12/19 1540 10/12/19 1622  BP:  (!) 139/55  (!) 144/64  Pulse:  71  71  Resp: 13 17 14 18   Temp:    98.1 F (36.7 C)  TempSrc:    Oral  SpO2:  100%  100%  Weight:      Height:        General exam: Middle-aged female, moderately built and overweight lying comfortably propped up in bed without distress.  On room air. Respiratory system: Clear to auscultation.  No  increased work of breathing. Cardiovascular system: S1 and S2 heard, RRR.  No JVD, murmurs or pedal edema.  Telemetry personally reviewed: Sinus rhythm.  Occasional PVCs.  Occasional brief episodes of NSSVT and NSVT Gastrointestinal system: Abdomen is nondistended, soft and nontender. No organomegaly or masses felt. Normal bowel sounds heard.  Right groin groin and upper thigh subacute bruising, improving per son. Central nervous system: Alert and oriented x2.  Severe dysarthria.  Facial asymmetry +. Extremities: Right upper extremity 5 x 5 power, right lower extremity at least grade 4 x 5 power.  Left extremities grade 1 x 5 power. Skin: No rashes, lesions or ulcers Psychiatry: Judgement and insight impaired. Mood & affect cannot be assessed at this time. Right eye: Right eyelids slightly more swollen than yesterday, refuses to open eyelids/some pain.  Pupils reacting to light.  Slightly worsened conjunctival congestion.??   Photophobia.    Data Reviewed:   I have personally reviewed following labs and imaging studies   CBC: Recent Labs  Lab 10/10/19 1821 10/11/19 0406 10/12/19 0446  WBC 13.3* 15.1* 11.8*  HGB 8.7* 8.7* 8.5*  HCT 27.8* 28.2* 26.7*  MCV 98.6 97.9 97.1  PLT 209 198 206    Basic Metabolic Panel: Recent Labs  Lab 10/06/19 0439 10/07/19 0446 10/10/19 0811 10/11/19 0406 10/12/19 0446  NA 148*   < > 147* 141 142  K 3.5   < > 3.6 3.5 3.3*  CL 113*   < > 114* 110 110  CO2 24   < > 23 22 21*  GLUCOSE 123*   < > 118* 126* 127*  BUN 14   < > 33* 23 17  CREATININE 1.01*   < > 1.12* 1.02* 0.91  CALCIUM 9.3   < > 8.6* 8.6* 8.7*  MG 1.7   < > 1.9 1.6* 1.7  PHOS 2.2*  --   --   --   --    < > = values in this interval not displayed.    Liver Function Tests: No results for input(s): AST, ALT, ALKPHOS, BILITOT, PROT, ALBUMIN in the last 168 hours.  CBG: Recent Labs  Lab 10/12/19 0743 10/12/19 1130 10/12/19 1549  GLUCAP 124* 165* 133*    Microbiology Studies:   Recent Results (from the past 240 hour(s))  Culture, Urine     Status: Abnormal   Collection Time: 10/04/19  2:50 AM   Specimen: Urine, Clean Catch  Result Value Ref Range Status   Specimen Description URINE, CLEAN CATCH  Final   Special Requests   Final    NONE Performed at [facility], [address] [place], [place] [address]    Culture >=100,000 COLONIES/mL AEROCOCCUS VIRIDANS (A)  Final   Report Status 10/06/2019 FINAL  Final     Radiology Studies:  No results found.   Scheduled Meds:   . amantadine  100 mg Oral BID  . amoxicillin  500 mg Oral Q8H  . atorvastatin  80 mg Oral q1800  . carvedilol  3.125 mg Oral BID WC  . chlorhexidine  15 mL Mouth Rinse BID  . clopidogrel  75 mg Oral Q breakfast  . feeding supplement  1 Container Oral BID BM  . insulin aspart  0-15 Units Subcutaneous TID WC  . mouth rinse  15 mL Mouth Rinse q12n4p  . melatonin  3 mg Oral QHS  . pantoprazole (PROTONIX)  IV  40 mg Intravenous Q12H  . sodium chloride flush  3 mL Intravenous Q12H  Continuous Infusions:   . sodium chloride Stopped (10/08/19 1620)     LOS: 15 days     Marcellus Scott, MD, [place], [facility]. [facility]    To contact the attending provider between 7A-7P or the covering provider during after hours 7P-7A, please log into the web site [email or web address] and access using universal [place] password for that web site. If you do not have the password, please call the hospital operator.  10/12/2019, 5:16 PM

## 2019-10-13 DIAGNOSIS — K5900 Constipation, unspecified: Secondary | ICD-10-CM

## 2019-10-13 LAB — GLUCOSE, CAPILLARY
Glucose-Capillary: 108 mg/dL — ABNORMAL HIGH (ref 70–99)
Glucose-Capillary: 101 mg/dL — ABNORMAL HIGH (ref 70–99)
Glucose-Capillary: 142 mg/dL — ABNORMAL HIGH (ref 70–99)
Glucose-Capillary: 140 mg/dL — ABNORMAL HIGH (ref 70–99)
Glucose-Capillary: 144 mg/dL — ABNORMAL HIGH (ref 70–99)

## 2019-10-13 LAB — CBC
HCT: 27 % — ABNORMAL LOW (ref 36.0–46.0)
Hemoglobin: 8.3 g/dL — ABNORMAL LOW (ref 12.0–15.0)
MCH: 30.1 pg (ref 26.0–34.0)
MCHC: 30.7 g/dL (ref 30.0–36.0)
MCV: 97.8 fL (ref 80.0–100.0)
Platelets: 199 K/uL (ref 150–400)
RBC: 2.76 MIL/uL — ABNORMAL LOW (ref 3.87–5.11)
RDW: 18.7 % — ABNORMAL HIGH (ref 11.5–15.5)
WBC: 9.2 K/uL (ref 4.0–10.5)
nRBC: 0 % (ref 0.0–0.2)

## 2019-10-13 LAB — BASIC METABOLIC PANEL WITH GFR
Anion gap: 9 (ref 5–15)
BUN: 13 mg/dL (ref 8–23)
CO2: 21 mmol/L — ABNORMAL LOW (ref 22–32)
Calcium: 8.6 mg/dL — ABNORMAL LOW (ref 8.9–10.3)
Chloride: 112 mmol/L — ABNORMAL HIGH (ref 98–111)
Creatinine, Ser: 0.81 mg/dL (ref 0.44–1.00)
GFR calc Af Amer: >60 mL/min
GFR calc non Af Amer: >60 mL/min
Glucose, Bld: 114 mg/dL — ABNORMAL HIGH (ref 70–99)
Potassium: 4.3 mmol/L (ref 3.5–5.1)
Sodium: 142 mmol/L (ref 135–145)

## 2019-10-13 LAB — MAGNESIUM: Magnesium: 2.4 mg/dL (ref 1.7–2.4)

## 2019-10-13 MED ORDER — DOCUSATE SODIUM 50 MG/5ML PO LIQD
100.0000 mg | Freq: Two times a day (BID) | ORAL | Status: DC
  Administered 2019-10-13 – 2019-10-16 (×6): 100 mg via ORAL
  Filled 2019-10-13 (×6): qty 10

## 2019-10-13 NOTE — Plan of Care (Signed)
  Problem: Pain Managment: Goal: General experience of comfort will improve Outcome: Progressing   Problem: Nutrition: Goal: Dietary intake will improve Outcome: Progressing

## 2019-10-13 NOTE — Progress Notes (Signed)
PROGRESS NOTE   Brittney Kennedy  ZHY:[id number]    DOB: [birth year]    DOA: 09/27/2019  PCP: Patient, No Pcp Per   I have briefly reviewed patients previous medical records in [facility].  Chief Complaint:     Brief Narrative:  69 year old female with PMH of chronic systolic CHF and CAD who was admitted from 3/25-3/26 at [facility] for NSTEMI with cath revealing severe multivessel CAD, EF 20%.  She was transferred to [facility] for CHF optimization and CT surgical evaluation for CABG.  Hospital course complicated by fall and right groin hematoma, acute encephalopathy, acute CVA, acute upper GI bleed due to duodenal ulcer s/p EGD 4/6, acute blood loss anemia requiring blood transfusions.  Cardiology, neurology, [facility] on board.  Care transferred to [facility] on 4/7.  GI bleed resolved.  Anemia stable.  Medically stable for DC to CIR pending bed/insurance.   Assessment & Plan:  Principal Problem:   NSTEMI (non-ST elevated myocardial infarction) [facility]) Active Problems:   Acute systolic CHF (congestive heart failure) (HCC)   Cerebral embolism with cerebral infarction   Malnutrition of moderate degree   Upper GI bleed   Status post coronary artery stent placement   Acute blood loss anemia   Duodenal ulcer   CAD with NSTEMI: Cath 3/26 with severe diffuse three-vessel CAD, EF 20%.  Transferred from [facility] to [facility] for CABG evaluation but not felt to be an appropriate candidate.  S/p PCI of proximal LAD with DES x1 and PCI of mid circumflex with DES x1 on 3/29.  TTE 10/02/2019 showed improved EF up to 50-55%.  No anginal symptoms.  Aspirin on hold due to GI bleed.  Continuing carvedilol, Plavix and statins.  Cardiology will follow peripherally.  Acute systolic CHF due to NICM: Initial LVEF 25%.  Repeat TTE 3/31 showed LVEF has normalized to 50-55%.  Apart from trace ankle edema, appears euvolemic.  Right groin hematoma sustained status post fall: Had hematoma at cath site.  Ultrasound negative for  pseudoaneurysm.  CT positive for hematoma.  Site appears stable.  Does not seem to be the cause for blood loss anemia at this time.  No Lovenox due to recent large upper GI bleed from duodenal ulcer  Hypokalemia and hypomagnesemia: Replaced.  K4.3 and magnesium 2.4.  Severe protein calorie malnutrition: Advance diet as tolerated and based on stability of GI bleed.  Tolerating dysphagia 1 diet and nectar thickened liquids.  Physical deconditioning: Therapies to continue to work with her.  Medically stable for DC.  Hopefully can go to CIR 4/12 pending bed and insurance clearance.  Acute MCA stroke with left hemiplegia: CT head 3/28 without acute findings.  CT head 3/29 with right basilar infarct, small right pontine infarct.  CT head 3/30: Acute infarct centered in the right parietal lobe.  Mental status changes improving.  Still has dysarthria and aphasia.  Currently on nectar thickened liquids, when able advance to dysphagia 1 diet.  DC to CIR when able.  Remains on Plavix.  Repeat MRI brain 4/7: Large infarct in the posterior right MCA territory, including confluent ischemia in the deep right MCA white matter, and the splenium of the corpus callosum.  Aside from the splenium, these areas were abnormal on CTP last week.  There is also evidence of patchy infarcts in the deep left MCA white matter, not evident last week.  No significant intracranial mass-effect.   Hypernatremia: Sodium had peaked to 148.  Treated briefly with IV D5 infusion.  Resolved.  NSVT: Continue carvedilol 3.125 twice daily.  Replaced hypomagnesemia and hypokalemia.  Consider titrating up carvedilol if blood pressure allows.  Acute upper GI bleed due to duodenal ulcer: S/p urgent EGD 4/6 which showed duodenal bulb ulcer with visible blood vessel, endoclips x5 placed.  Had a smidge of old blood PR in the last 48 hours but no large melena or rectal bleed.  [facility] following, discussed with Dr. Adela Lank.  Aspirin discontinued  but remains on Plavix due to recently placed coronary DES.  Completed 72 hours of IV PPI infusion.  As discussed with Dr. Adela Lank 4/9, changed IV Protonix to 40 mg twice daily while hospitalized then can change to oral at discharge and outpatient follow-up with [facility].  GI bleeding resolved.  Acute blood loss anemia: Secondary to GI bleed.  Has been transfused total of 3 units PRBCs thus far, last transfusion on 4/7.  Hemoglobin stable, mild hemoglobin drift down likely due to lab draws, 8.7 > 8.5 > 8.3.  Recheck in a.m. and if stable then check either twice a week or once a week.   Leukocytosis: Resolved.  Acute encephalopathy: Likely multifactorial related to recent acute stroke, medications and others.  Continues to improve.  Dysphagia: Continue dysphagia 1 diet and nectar thickened liquids, tolerating.  Prediabetes: A1c 5.9 on 3/25.  SSI.  Right eye keratoconjunctivitis: Treated yesterday with artificial tears.  Worsened symptoms 4/10 and ophthalmology consulted.  Suspecting viral versus bacterial etiology and treating with erythromycin eyedrops for bacterial coverage and also for comfort of her keratitis for at least 7 days.  Warm compress to the eyelids.  Improving.  Insomnia: Patient takes melatonin 10 mg at bedtime at night.  Trial of 3 mg at bedtime here son agreeable, did not help but son does not want to increase dose or try other medications at this time.  Constipation: Started Colace as discussed with son, increased dose 200 twice daily.  Body mass index is 28.99 kg/m.  Nutritional Status Nutrition Problem: Moderate Malnutrition Etiology: acute illness Signs/Symptoms: mild muscle depletion, mild fat depletion Interventions: Tube feeding   Aerococcus viridans UTI: Although patient has no symptoms of UTI, she is not a great historian given aphasia and dysarthria, no fevers but had mild leukocytosis.  Discussed in detail with infectious disease MD on call 4/10 who  recommended treating with amoxicillin x5 days.  Complete course.  DVT prophylaxis: SCDs Code Status: Full Family Communication: Discussed again in detail with patient's son at bedside on 4/10, updated care and answered questions.  None at bedside today. Disposition:  . Patient came from: Home           . Anticipated d/c place: CIR . Barriers to d/c: Medically stable for DC to CIR.   Consultants:   Cardiology Neurology [facility] MD Ophthalmology.  Procedures:   EGD 4/6  Antimicrobials:   None   Subjective:  Patient interviewed and examined along with RN in room.  Patient alert, more interactive today.  Indicates right eye pain is less and is able to open eye better.  Tolerating diet.  No BM for 4 to 5 days.  Objective:   Vitals:   10/13/19 0311 10/13/19 0332 10/13/19 0834 10/13/19 1127  BP:  (!) 131/51 (!) 130/58 140/60  Pulse:  72 78 76  Resp:  18 18   Temp:  98.1 F (36.7 C) 98.4 F (36.9 C) 98.1 F (36.7 C)  TempSrc:  Oral Oral Oral  SpO2:  96% 97% 98%  Weight: 71.9  kg     Height:        General exam: Middle-aged female, moderately built and overweight lying comfortably propped up in bed without distress.  On room air. Respiratory system: Clear to auscultation.  No increased work of breathing Cardiovascular system: S1 and S2 heard, RRR.  No JVD, murmurs.  Trace ankle edema.  Telemetry personally reviewed: Sinus rhythm.  Occasional nonsustained SVT and VT. Gastrointestinal system: Abdomen is nondistended, soft and nontender. No organomegaly or masses felt. Normal bowel sounds heard.  Right groin groin and upper thigh subacute bruising, improving per son. Central nervous system: Alert and oriented x2.  Severe dysarthria.  Facial asymmetry +. Extremities: Right upper extremity 5 x 5 power, right lower extremity at least grade 4 x 5 power.  Left extremities grade 1 x 5 power. Skin: No rashes, lesions or ulcers Psychiatry: Judgement and insight impaired. Mood &  affect appears to be in good spirits today. Right eye: Right eyelids swelling better than yesterday, able to open eye some.  Pupils reacting to light.  Minimal conjunctival congestion.  No significant photophobia.    Data Reviewed:   I have personally reviewed following labs and imaging studies   CBC: Recent Labs  Lab 10/11/19 0406 10/12/19 0446 10/13/19 0430  WBC 15.1* 11.8* 9.2  HGB 8.7* 8.5* 8.3*  HCT 28.2* 26.7* 27.0*  MCV 97.9 97.1 97.8  PLT 198 206 254    Basic Metabolic Panel: Recent Labs  Lab 10/11/19 0406 10/12/19 0446 10/13/19 0430  NA 141 142 142  K 3.5 3.3* 4.3  CL 110 110 112*  CO2 22 21* 21*  GLUCOSE 126* 127* 114*  BUN 23 17 13   CREATININE 1.02* 0.91 0.81  CALCIUM 8.6* 8.7* 8.6*  MG 1.6* 1.7 2.4    Liver Function Tests: No results for input(s): AST, ALT, ALKPHOS, BILITOT, PROT, ALBUMIN in the last 168 hours.  CBG: Recent Labs  Lab 10/13/19 0606 10/13/19 0751 10/13/19 1131  GLUCAP 108* 101* 142*    Microbiology Studies:   Recent Results (from the past 240 hour(s))  Culture, Urine     Status: Abnormal   Collection Time: 10/04/19  2:50 AM   Specimen: Urine, Clean Catch  Result Value Ref Range Status   Specimen Description URINE, CLEAN CATCH  Final   Special Requests   Final    NONE Performed at [facility], [place] [address], [place], [place] [address]    Culture >=100,000 COLONIES/mL AEROCOCCUS VIRIDANS (A)  Final   Report Status 10/06/2019 FINAL  Final     Radiology Studies:  No results found.   Scheduled Meds:   . amantadine  100 mg Oral BID  . amoxicillin  500 mg Oral Q8H  . atorvastatin  80 mg Oral q1800  . carvedilol  3.125 mg Oral BID WC  . chlorhexidine  15 mL Mouth Rinse BID  . clopidogrel  75 mg Oral Q breakfast  . docusate  50 mg Oral BID  . erythromycin   Right Eye Q6H  . feeding supplement  1 Container Oral BID BM  . insulin aspart  0-15 Units Subcutaneous TID WC  . mouth rinse  15 mL Mouth Rinse q12n4p    . melatonin  3 mg Oral QHS  . pantoprazole (PROTONIX) IV  40 mg Intravenous Q12H  . sodium chloride flush  3 mL Intravenous Q12H    Continuous Infusions:   . sodium chloride Stopped (10/08/19 1620)     LOS: 16 days  Marcellus Scott, MD, [place], [facility]. [facility]    To contact the attending provider between 7A-7P or the covering provider during after hours 7P-7A, please log into the web site [email or web address] and access using universal [place] password for that web site. If you do not have the password, please call the hospital operator.  10/13/2019, 2:50 PM

## 2019-10-13 NOTE — Progress Notes (Signed)
Tele called to report 6 beats of VT. Pt assessed and asymptomatic. Provider notified.

## 2019-10-14 LAB — BASIC METABOLIC PANEL
Anion gap: 9 (ref 5–15)
BUN: 10 mg/dL (ref 8–23)
CO2: 23 mmol/L (ref 22–32)
Calcium: 8.9 mg/dL (ref 8.9–10.3)
Chloride: 107 mmol/L (ref 98–111)
Creatinine, Ser: 0.87 mg/dL (ref 0.44–1.00)
GFR calc Af Amer: >60 mL/min (ref >60)
GFR calc non Af Amer: >60 mL/min (ref >60)
Glucose, Bld: 113 mg/dL — ABNORMAL HIGH (ref 70–99)
Potassium: 3.6 mmol/L (ref 3.5–5.1)
Sodium: 139 mmol/L (ref 135–145)

## 2019-10-14 LAB — CBC
HCT: 27 % — ABNORMAL LOW (ref 36.0–46.0)
Hemoglobin: 8.4 g/dL — ABNORMAL LOW (ref 12.0–15.0)
MCH: 30 pg (ref 26.0–34.0)
MCHC: 31.1 g/dL (ref 30.0–36.0)
MCV: 96.4 fL (ref 80.0–100.0)
Platelets: 208 10*3/uL (ref 150–400)
RBC: 2.8 MIL/uL — ABNORMAL LOW (ref 3.87–5.11)
RDW: 17.7 % — ABNORMAL HIGH (ref 11.5–15.5)
WBC: 9.6 10*3/uL (ref 4.0–10.5)
nRBC: 0 % (ref 0.0–0.2)

## 2019-10-14 LAB — GLUCOSE, CAPILLARY
Glucose-Capillary: 103 mg/dL — ABNORMAL HIGH (ref 70–99)
Glucose-Capillary: 114 mg/dL — ABNORMAL HIGH (ref 70–99)
Glucose-Capillary: 116 mg/dL — ABNORMAL HIGH (ref 70–99)
Glucose-Capillary: 124 mg/dL — ABNORMAL HIGH (ref 70–99)
Glucose-Capillary: 186 mg/dL — ABNORMAL HIGH (ref 70–99)

## 2019-10-14 LAB — MAGNESIUM: Magnesium: 1.6 mg/dL — ABNORMAL LOW (ref 1.7–2.4)

## 2019-10-14 MED ORDER — POTASSIUM CHLORIDE CRYS ER 20 MEQ PO TBCR
40.0000 meq | EXTENDED_RELEASE_TABLET | Freq: Once | ORAL | Status: AC
  Administered 2019-10-14: 40 meq via ORAL
  Filled 2019-10-14: qty 2

## 2019-10-14 MED ORDER — SENNA 8.6 MG PO TABS
1.0000 | ORAL_TABLET | Freq: Once | ORAL | Status: AC
  Administered 2019-10-14: 8.6 mg via ORAL
  Filled 2019-10-14: qty 1

## 2019-10-14 MED ORDER — MAGNESIUM SULFATE 4 GM/100ML IV SOLN
4.0000 g | Freq: Once | INTRAVENOUS | Status: AC
  Administered 2019-10-14: 4 g via INTRAVENOUS
  Filled 2019-10-14: qty 100

## 2019-10-14 NOTE — Progress Notes (Signed)
PROGRESS NOTE   Brittney Kennedy  JZP:[id number]    DOB: 21-Jan-1951    DOA: 09/27/2019  PCP: Patient, No Pcp Per   I have briefly reviewed patients previous medical records in [facility].  Chief Complaint:     Brief Narrative:  69 year old female with PMH of chronic systolic CHF and CAD who was admitted from 3/25-3/26 at [facility] for NSTEMI with cath revealing severe multivessel CAD, EF 20%.  She was transferred to [facility] for CHF optimization and CT surgical evaluation for CABG.  Hospital course complicated by fall and right groin hematoma, acute encephalopathy, acute CVA, acute upper GI bleed due to duodenal ulcer s/p EGD 4/6, acute blood loss anemia requiring blood transfusions.  Cardiology, neurology, [facility] on board.  Care transferred to [facility] on 4/7.  GI bleed resolved.  Anemia stable.  Medically stable for DC to CIR pending bed/insurance.  CIR team coordinating with patient's son to see if CIR is the most appropriate postacute rehab venue that he would like for her and in any event, CIR does not have a bed available for 4/12.   Assessment & Plan:  Principal Problem:   NSTEMI (non-ST elevated myocardial infarction) [facility]) Active Problems:   Acute systolic CHF (congestive heart failure) (HCC)   Cerebral embolism with cerebral infarction   Malnutrition of moderate degree   Upper GI bleed   Status post coronary artery stent placement   Acute blood loss anemia   Duodenal ulcer   CAD with NSTEMI: Cath 3/26 with severe diffuse three-vessel CAD, EF 20%.  Transferred from [facility] to [facility] for CABG evaluation but not felt to be an appropriate candidate.  S/p PCI of proximal LAD with DES x1 and PCI of mid circumflex with DES x1 on 3/29.  TTE 10/02/2019 showed improved EF up to 50-55%.  No anginal symptoms.  Aspirin on hold due to GI bleed.  Continuing carvedilol, Plavix and statins.  Cardiology follow-up 4/12 appreciated, signed off and plan to follow from a distance.  Outpatient follow-up with  Cardiology.  Acute systolic CHF due to NICM: Initial LVEF 25%.  Repeat TTE 3/31 showed LVEF has normalized to 50-55%.  Apart from trace ankle edema, appears euvolemic.  Outpatient follow-up with cardiology.  Right groin hematoma sustained status post fall: Had hematoma at cath site.  Ultrasound negative for pseudoaneurysm.  CT positive for hematoma.  Stable.  Hypokalemia and hypomagnesemia: Potassium down again to 3.6 and magnesium to 1.6.  Replacing today.  Follow labs in a.m. and if has recurrent drops, may consider placing on maintenance potassium and magnesium supplements.  Severe protein calorie malnutrition: Advance diet as tolerated and based on stability of GI bleed.  Tolerating dysphagia 1 diet and nectar thickened liquids.  Physical deconditioning: Therapies to continue to work with her.  Medically stable for DC.  CIR team coordinating with patient's son to see if CIR is the most appropriate postacute rehab venue that he would like for her and in any event, CIR does not have a bed available for 4/12.  Acute MCA stroke with left hemiplegia: CT head 3/28 without acute findings.  CT head 3/29 with right basilar infarct, small right pontine infarct.  CT head 3/30: Acute infarct centered in the right parietal lobe.  Mental status changes improving.  Still has dysarthria and aphasia.  Currently on nectar thickened liquids, when able advance to dysphagia 1 diet.  DC to CIR when able.  Remains on Plavix.  Repeat MRI brain 4/7: Large infarct in the posterior  right MCA territory, including confluent ischemia in the deep right MCA white matter, and the splenium of the corpus callosum.  Aside from the splenium, these areas were abnormal on CTP last week.  There is also evidence of patchy infarcts in the deep left MCA white matter, not evident last week.  No significant intracranial mass-effect.  Outpatient follow-up with neurology.  Hypernatremia: Sodium had peaked to 148.  Treated briefly with IV D5  infusion.  Resolved.    NSVT: Continue carvedilol 3.125 twice daily.  Replaced hypomagnesemia and hypokalemia.  Consider titrating up carvedilol if blood pressure allows.  Acute upper GI bleed due to duodenal ulcer: S/p urgent EGD 4/6 which showed duodenal bulb ulcer with visible blood vessel, endoclips x5 placed.  Had a smidge of old blood PR in the last 48 hours but no large melena or rectal bleed.  [facility] following, discussed with Dr. Adela Lank.  Aspirin discontinued but remains on Plavix due to recently placed coronary DES.  Completed 72 hours of IV PPI infusion.  As discussed with Dr. Adela Lank 4/9, changed IV Protonix to 40 mg twice daily while hospitalized then can change to oral at discharge and outpatient follow-up with [facility].  GI bleeding resolved.  Acute blood loss anemia: Secondary to GI bleed.  Has been transfused total of 3 units PRBCs thus far, last transfusion on 4/7.  Hemoglobin stable, mild hemoglobin drift down likely due to lab draws, 8.7 > 8.5 > 8.3 >8.4.  Minimize lab draws unless needed, biweekly CBC.  Leukocytosis: Resolved.  Acute encephalopathy: Likely multifactorial related to recent acute stroke, medications and others.  Continues to gradually improve.  Dysphagia: Continue dysphagia 1 diet and nectar thickened liquids, tolerating.  Prediabetes: A1c 5.9 on 3/25.  SSI.  Right eye keratoconjunctivitis: Treated yesterday with artificial tears.  Worsened symptoms 4/10 and ophthalmology consulted.  Suspecting viral versus bacterial etiology and treating with erythromycin eyedrops for bacterial coverage and also for comfort of her keratitis for at least 7 days.  Warm compress to the eyelids.  Slowly improving.  If does not continue to improve or worsens, consult back Dr. Maryagnes Amos, Ophthalmology who last saw her on 4/10.  Insomnia: Patient takes melatonin 10 mg at bedtime at night.  Trial of 3 mg at bedtime here son agreeable, did not help, but son does not want  to increase dose or try other medications at this time.  Constipation: Started Colace as discussed with son, increased dose to 100 twice daily.  No BM for several days.  Will give a dose of senna.  Body mass index is 29.44 kg/m.  Nutritional Status Nutrition Problem: Moderate Malnutrition Etiology: acute illness Signs/Symptoms: mild muscle depletion, mild fat depletion Interventions: Tube feeding   Aerococcus viridans UTI: Although patient has no symptoms of UTI, she is not a great historian given aphasia and dysarthria, no fevers but had mild leukocytosis.  Discussed in detail with infectious disease MD on call 4/10 who recommended treating with amoxicillin x5 days.  Complete course.  DVT prophylaxis: SCDs Code Status: Full Family Communication: Discussed again in detail with patient's son at bedside on 4/10, updated care and answered questions.  None at bedside today. Disposition:  . Patient came from: Home           . Anticipated d/c place: CIR . Barriers to d/c: Medically stable for DC to CIR. CIR team coordinating with patient's son to see if CIR is the most appropriate postacute rehab venue that he would like for her  and in any event, CIR does not have a bed available for 4/12.   Consultants:   Cardiology Neurology  GI Rehab MD Ophthalmology.  Procedures:   EGD 4/6  Antimicrobials:   Amoxicillin   Subjective:  No BM for several days now.  No abdominal pain, nausea vomiting reported.  Tolerating current diet.  Able to mildly open right eye and has some pain.  Objective:   Vitals:   10/14/19 0400 10/14/19 0500 10/14/19 0902 10/14/19 1203  BP: 98/87  (!) 152/67 129/75  Pulse: 79  79 72  Resp: 20  20 20   Temp: 99.1 F (37.3 C)  98.6 F (37 C) 98.4 F (36.9 C)  TempSrc: Oral  Oral Oral  SpO2: 99%  100% 99%  Weight:  73 kg    Height:        General exam: Middle-aged female, moderately built and overweight lying comfortably propped up in bed without  distress.  On room air. Respiratory system: Clear to auscultation.  No increased work of breathing Cardiovascular system: S1 and S2 heard, RRR.  No JVD, murmurs.  Trace ankle edema.  Telemetry personally reviewed: Sinus rhythm.  No NSVT or NS SVT noted in the last 24 hours. Gastrointestinal system: Abdomen is nondistended, soft and nontender.  No organomegaly or masses appreciated.  Right groin/thigh hematoma last examined a couple days ago and stable. Central nervous system: Alert and oriented x2.  Severe dysarthria.  Facial asymmetry +. Extremities: Right upper extremity 5 x 5 power, right lower extremity at least grade 4 x 5 power.  Left extremities grade 1 x 5 power.  Not much change. Skin: No rashes, lesions or ulcers Psychiatry: Judgement and insight impaired. Mood & affect appears to be in good spirits today. Right eye: Right eyelids, mild, slightly better compared to 48 hours ago but not significantly different than yesterday.  Able to open her right eye mildly but has some discomfort.  Pupils reacting to light.  Minimal conjunctival congestion persists.      Data Reviewed:   I have personally reviewed following labs and imaging studies   CBC: Recent Labs  Lab 10/12/19 0446 10/13/19 0430 10/14/19 0420  WBC 11.8* 9.2 9.6  HGB 8.5* 8.3* 8.4*  HCT 26.7* 27.0* 27.0*  MCV 97.1 97.8 96.4  PLT 206 199 208    Basic Metabolic Panel: Recent Labs  Lab 10/12/19 0446 10/13/19 0430 10/14/19 0420  NA 142 142 139  K 3.3* 4.3 3.6  CL 110 112* 107  CO2 21* 21* 23  GLUCOSE 127* 114* 113*  BUN 17 13 10   CREATININE 0.91 0.81 0.87  CALCIUM 8.7* 8.6* 8.9  MG 1.7 2.4 1.6*    Liver Function Tests: No results for input(s): AST, ALT, ALKPHOS, BILITOT, PROT, ALBUMIN in the last 168 hours.  CBG: Recent Labs  Lab 10/14/19 0017 10/14/19 0403 10/14/19 1205  GLUCAP 114* 103* 186*    Microbiology Studies:   No results found for this or any previous visit (from the past 240  hour(s)).   Radiology Studies:  No results found.   Scheduled Meds:   . amantadine  100 mg Oral BID  . amoxicillin  500 mg Oral Q8H  . atorvastatin  80 mg Oral q1800  . carvedilol  3.125 mg Oral BID WC  . chlorhexidine  15 mL Mouth Rinse BID  . clopidogrel  75 mg Oral Q breakfast  . docusate  100 mg Oral BID  . erythromycin   Right Eye Q6H  .  feeding supplement  1 Container Oral BID BM  . insulin aspart  0-15 Units Subcutaneous TID WC  . mouth rinse  15 mL Mouth Rinse q12n4p  . melatonin  3 mg Oral QHS  . pantoprazole (PROTONIX) IV  40 mg Intravenous Q12H  . sodium chloride flush  3 mL Intravenous Q12H    Continuous Infusions:   . sodium chloride Stopped (10/08/19 1620)     LOS: 17 days     Vernell Leep, MD, [place], [facility]. [facility]    To contact the attending provider between 7A-7P or the covering provider during after hours 7P-7A, please log into the web site [email or web address] and access using universal [place] password for that web site. If you do not have the password, please call the hospital operator.  10/14/2019, 4:09 PM

## 2019-10-14 NOTE — Progress Notes (Signed)
Physical Therapy Treatment Patient Details Name: Brittney Kennedy MRN: [id number] DOB: [birth year] Today's Date: 10/14/2019    History of Present Illness 69 year old female to the hospital for nausea, vomiting, epigastric pain x 1 week, admitted 09/27/19 for NSTEMI. cath 3/26 revealed severe multivessel CAD. Patient transferred to [facility]. Fall 3/27 with R groin hematoma. 3/28 patient with AMS due to pain meds and rapid response 3/29 due to AMS and L facial droop with weakness. CT: R basal ganglia infarct, MRI on 10/09/19 reveals large R MCA infarct. PMH: CAD, cholecystectomy    PT Comments    Patient keeps eyes closed throughout session due to rt eye infection and pain/photophobia when eye open. Patient continues to require mod to max assist for mobility OOB to chair. Able to engage left hip/knee extensors for brief periods, but then loses contraction (appears inattention, but also due to weakness/fatigue). Goals updated due to timeframe.      Follow Up Recommendations  CIR     Equipment Recommendations  Other (comment)(TBA)    Recommendations for Other Services       Precautions / Restrictions Precautions Precautions: Fall Precaution Comments: aspiration precautions (dysphagia 1 with nectar), HOB >30 degrees Required Braces or Orthoses: Other Brace Other Brace: L sling to prevent further sublaxation Restrictions Weight Bearing Restrictions: No    Mobility  Bed Mobility Overal bed mobility: Needs Assistance Bed Mobility: Rolling;Sidelying to Sit Rolling: Max assist Sidelying to sit: +2 for physical assistance;Mod assist;HOB elevated       General bed mobility comments: pt requires incr time for processing and assist for all mobility due to weakness  Transfers Overall transfer level: Needs assistance Equipment used: Ambulation equipment used Transfers: Sit to/from Stand Sit to Stand: Max assist;+2 physical assistance;+2 safety/equipment;From elevated surface         General  transfer comment: used stedy from bed; stood again from stedy seat x 2; pt with poor attention/use of LLE (demonstrated improved isolated quad strength during resisted exercise than during standing/wt-bearing  Ambulation/Gait                 Stairs             Wheelchair Mobility    Modified Rankin (Stroke Patients Only) Modified Rankin (Stroke Patients Only) Pre-Morbid Rankin Score: No symptoms Modified Rankin: Severe disability     Balance Overall balance assessment: Needs assistance Sitting-balance support: Single extremity supported;Feet supported Sitting balance-Leahy Scale: Poor Sitting balance - Comments: leans to her right with RUE for support   Standing balance support: Single extremity supported Standing balance-Leahy Scale: Poor Standing balance comment: LUE in sling for support                            Cognition Arousal/Alertness: Awake/alert(keeps eyes closed due to rt eye infection--pain and photopho) Behavior During Therapy: Anxious Overall Cognitive Status: No family/caregiver present to determine baseline cognitive functioning Area of Impairment: Orientation;Attention;Memory;Following commands;Safety/judgement;Awareness;Problem solving                 Orientation Level: (time NT) Current Attention Level: Sustained   Following Commands: Follows one step commands with increased time Safety/Judgement: Decreased awareness of safety;Decreased awareness of deficits Awareness: Emergent Problem Solving: Slow processing;Decreased initiation;Difficulty sequencing;Requires verbal cues;Requires tactile cues General Comments: Increased cues to remain focused on task at hand;       Exercises General Exercises - Lower Extremity Ankle Circles/Pumps: AROM;Both;10 reps Heel Slides: AAROM;Strengthening;Left;5 reps(in recliner, assisted flexion, min  resisted extension )    General Comments General comments (skin integrity, edema, etc.):  Patient mumbles her words, however when told to use her "adult" voice, she is much more intelligible. She is focused on what she cannot do because of weakness, however this distracts her from focusing on performing tasks during session. in sit to stand, she initially engages LLE, however quickly fatigues/loses attention with knee buckling.       Pertinent Vitals/Pain Pain Assessment: Faces Faces Pain Scale: No hurt    Home Living                      Prior Function            PT Goals (current goals can now be found in the care plan section) Acute Rehab PT Goals Patient Stated Goal: get stronger PT Goal Formulation: With patient Time For Goal Achievement: 10/28/19 Potential to Achieve Goals: Good Progress towards PT goals: Progressing toward goals(goals updated due to timeframe)    Frequency    Min 4X/week      PT Plan Current plan remains appropriate    Co-evaluation              AM-PAC PT "6 Clicks" Mobility   Outcome Measure  Help needed turning from your back to your side while in a flat bed without using bedrails?: A Lot Help needed moving from lying on your back to sitting on the side of a flat bed without using bedrails?: A Lot Help needed moving to and from a bed to a chair (including a wheelchair)?: A Lot Help needed standing up from a chair using your arms (e.g., wheelchair or bedside chair)?: A Lot Help needed to walk in hospital room?: Total Help needed climbing 3-5 steps with a railing? : Total 6 Click Score: 10    End of Session Equipment Utilized During Treatment: Gait belt Activity Tolerance: Patient limited by fatigue Patient left: in chair;with call bell/phone within reach;with chair alarm set Nurse Communication: Mobility status;Need for lift equipment PT Visit Diagnosis: Other abnormalities of gait and mobility (R26.89);Muscle weakness (generalized) (M62.81);Hemiplegia and hemiparesis;Other symptoms and signs involving the nervous  system (R29.898) Hemiplegia - Right/Left: Left Hemiplegia - dominant/non-dominant: Non-dominant Hemiplegia - caused by: Cerebral infarction     Time: 7829-5621 PT Time Calculation (min) (ACUTE ONLY): 33 min  Charges:  $Therapeutic Activity: 23-37 mins                      Arby Barrette, PT Pager [phone]    Rexanne Mano 10/14/2019, 1:06 PM

## 2019-10-14 NOTE — Progress Notes (Signed)
Inpatient Rehabilitation-Admissions Coordinator   Left voicemail for pt's son to discuss DC plans to see if CIR is the most appropriate post acute rehab venue. Await call back from her son. I do not have a bed available in CIR for this patient today even if we were to confirm stable DC support. Will to continue to follow.   Cheri Rous, OTR/L  Rehab Admissions Coordinator  [phone] 10/14/2019 11:44 AM

## 2019-10-14 NOTE — Progress Notes (Addendum)
Advanced Heart Failure Rounding Note   Subjective:    Events  - cath 09/27/19 3v CAD. EF 20% - right groin hematoma. U/s 3/27 negative for PSA - 3/28 altered mental status due to pain meds. Head CT, ABG and ammonia normal - 09/30/19 CVA, ACS with DES LAD and mid CX - 3/30 Headache with stat CT. EEG- no seizure, cortical dysfunction in the right hemisphere likely secondary to underlying infarct as well as moderate to severe diffuse encephalopathy, nonspecific to etiology. - 3/30 Echo EF 50-55% - 4/6 developed GIB. Hgb 7.3. Transfused x 2 units. Urgent EGD 4/6 showed an actively bleeding duodenal bulb ulcer with visible vessel, treated with epi and hemostasis clips x 5. Hemostasis achieved  -4/7 Got another unit of RBCs. Hgb up to 9.1. Placed on protonix drip.   Denies pain. Denies SOB.   Objective:   Weight Range:  Vital Signs:   Temp:  [98.3 F (36.8 C)-99.1 F (37.3 C)] 98.4 F (36.9 C) (04/12 1203) Pulse Rate:  [72-80] 72 (04/12 1203) Resp:  [18-20] 20 (04/12 1203) BP: (98-156)/(59-87) 129/75 (04/12 1203) SpO2:  [99 %-100 %] 99 % (04/12 1203) Weight:  [73 kg] 73 kg (04/12 0500) Last BM Date: 10/09/19  Weight change: Filed Weights   10/12/19 0358 10/13/19 0311 10/14/19 0500  Weight: 71.9 kg 71.9 kg 73 kg    Intake/Output:   Intake/Output Summary (Last 24 hours) at 10/14/2019 1220 Last data filed at 10/14/2019 0401 Gross per 24 hour  Intake 300 ml  Output 750 ml  Net -450 ml    PHYSICAL EXAM: General:  In bed. Well appearing. No resp difficulty HEENT: normal Neck: supple. no JVD. Carotids 2+ bilat; no bruits. No lymphadenopathy or thryomegaly appreciated. Cor: PMI nondisplaced. Regular rate & rhythm. No rubs, gallops or murmurs. Lungs: clear Abdomen: soft, nontender, nondistended. No hepatosplenomegaly. No bruits or masses. Good bowel sounds. Extremities: no cyanosis, clubbing, rash, edema. LUE weak with some movement in hand. LLE 4/5 weakness.  Neuro: alert &  orientedx3, cranial nerves grossly intact. moves all 4 extremities w/o difficulty. Affect pleasant  Telemetry: NSR 70-80s with occasional PVCs.   Labs: Basic Metabolic Panel: Recent Labs  Lab 10/10/19 0811 10/10/19 0811 10/11/19 0406 10/11/19 0406 10/12/19 0446 10/13/19 0430 10/14/19 0420  NA 147*  --  141  --  142 142 139  K 3.6  --  3.5  --  3.3* 4.3 3.6  CL 114*  --  110  --  110 112* 107  CO2 23  --  22  --  21* 21* 23  GLUCOSE 118*  --  126*  --  127* 114* 113*  BUN 33*  --  23  --  17 13 10   CREATININE 1.12*  --  1.02*  --  0.91 0.81 0.87  CALCIUM 8.6*   < > 8.6*   < > 8.7* 8.6* 8.9  MG 1.9  --  1.6*  --  1.7 2.4 1.6*   < > = values in this interval not displayed.    Liver Function Tests: No results for input(s): AST, ALT, ALKPHOS, BILITOT, PROT, ALBUMIN in the last 168 hours. No results for input(s): LIPASE, AMYLASE in the last 168 hours. No results for input(s): AMMONIA in the last 168 hours.  CBC: Recent Labs  Lab 10/10/19 1821 10/11/19 0406 10/12/19 0446 10/13/19 0430 10/14/19 0420  WBC 13.3* 15.1* 11.8* 9.2 9.6  HGB 8.7* 8.7* 8.5* 8.3* 8.4*  HCT 27.8* 28.2* 26.7* 27.0* 27.0*  MCV 98.6 97.9 97.1 97.8 96.4  PLT 209 198 206 199 208    Cardiac Enzymes: No results for input(s): CKTOTAL, CKMB, CKMBINDEX, TROPONINI in the last 168 hours.  BNP: BNP (last 3 results) No results for input(s): BNP in the last 8760 hours.  ProBNP (last 3 results) No results for input(s): PROBNP in the last 8760 hours.    Other results:  Imaging: No results found.   Medications:     Scheduled Medications: . amantadine  100 mg Oral BID  . amoxicillin  500 mg Oral Q8H  . atorvastatin  80 mg Oral q1800  . carvedilol  3.125 mg Oral BID WC  . chlorhexidine  15 mL Mouth Rinse BID  . clopidogrel  75 mg Oral Q breakfast  . docusate  100 mg Oral BID  . erythromycin   Right Eye Q6H  . feeding supplement  1 Container Oral BID BM  . insulin aspart  0-15 Units  Subcutaneous TID WC  . mouth rinse  15 mL Mouth Rinse q12n4p  . melatonin  3 mg Oral QHS  . pantoprazole (PROTONIX) IV  40 mg Intravenous Q12H  . sodium chloride flush  3 mL Intravenous Q12H    Infusions: . sodium chloride Stopped (10/08/19 1620)    PRN Medications: sodium chloride, acetaminophen **OR** acetaminophen, nitroGLYCERIN, ondansetron (ZOFRAN) IV, polyvinyl alcohol, Resource ThickenUp Clear   Assessment/Plan:   1. CAD with NSTEMI - cath with severe diffuse 3v CAD particularly on left side with severe LV dysfunction. Ideally will need CABG, however in looking at her films worry about the quality of her LAD target and also not sure she would benefit much from a graft to the RCA. Concerned about her mobility especially after recent fall an groin hematoma and low albumin - s/p PCI of pLAD w/ DES x 1 + PCI of mLCX w/ DES x 1 - CMRI- LVEF 29% RV 37% Global hypokinesis.  - 2D echo 10/02/19 showed improved EF, up to 50-55% - No chest pain.  - continue Plavix and statin. Hold aspirin with GI bleed. Discuss interventional team.   2. Acute systolic HF due to NICM - Initial EF ~25% - repeat echo 3/31, EF normalized, 50-55% - Holding off entresto. Per neurology allow SBP 130-150s  - Continue low dose carvedilol    3. R groin hematoma after fall - has hematoma at cath site.  - u/s negative for PSA - CT + hematoma. No mention of fracture but not completely imaged. - Urgent EGD 4/6 showed an actively bleeding duodenal bulb ulcer with visible vessel, treated with epi and hemostasis clips x 5. Hemostasis achieved   4. Hypokalemia/Hypomag K 3.6  - Mag 1.6  Give 4 grams mag.   5. Severe protein calorie malnutrition  - albumin 2.3  6.  Physical deconditioning - PT/OT/Speech following   7 . Stroke, Acute  -CT 3/28 No acute findings  -CT 3/29 at 0500 R basal infact, small r pontine infact.  -CT 3/30 - Acute infarct centered in the right parietal lobe correlating with perfusion  defects yesterday.2. Age-indeterminate infarct at the right caudate that is stable from yesterday. Continues to improve.   - PT/OT/Speech and CIR following.   8. Hypernatremia Resolved.   9. NSVT On low dose carvedilol   10. Anemia, GI bleed.  H/O gastric ulcers years ago per son.  +FOBT  - - Urgent EGD 4/6 showed an actively bleeding duodenal bulb ulcer with visible vessel, treated with epi and hemostasis clips x  5. Hemostasis achieved  - Continue  protonix 40 mg IV twice a day later today. ,   - ASA discontinued. Remains on Plavix for recently placed coronary DES - Hgb stable. No s/s bleeding.   Cardiology will follow from a distance. Please call if needed.   Length of Stay: 17   Amy Clegg NP-C  10/14/2019, 12:20 PM  Advanced Heart Failure Team Pager [phone] (M-F; 7a - 4p)  Please contact [facility] for night-coverage after hours (4p -7a ) and weekends on [email or web address]  Patient seen with NP, agree with the above note.   She is stable from a cardiac standpoint, no changes today.  Vitals remain stable.  Still with left hemiparesis.  Will need CIR or SNF.   Marca Ancona 10/14/2019

## 2019-10-15 LAB — GLUCOSE, CAPILLARY
Glucose-Capillary: 117 mg/dL — ABNORMAL HIGH (ref 70–99)
Glucose-Capillary: 164 mg/dL — ABNORMAL HIGH (ref 70–99)
Glucose-Capillary: 137 mg/dL — ABNORMAL HIGH (ref 70–99)
Glucose-Capillary: 134 mg/dL — ABNORMAL HIGH (ref 70–99)

## 2019-10-15 LAB — BASIC METABOLIC PANEL
Anion gap: 10 (ref 5–15)
BUN: 9 mg/dL (ref 8–23)
CO2: 25 mmol/L (ref 22–32)
Calcium: 9.2 mg/dL (ref 8.9–10.3)
Chloride: 103 mmol/L (ref 98–111)
Creatinine, Ser: 0.81 mg/dL (ref 0.44–1.00)
GFR calc Af Amer: >60 mL/min (ref >60)
GFR calc non Af Amer: >60 mL/min (ref >60)
Glucose, Bld: 127 mg/dL — ABNORMAL HIGH (ref 70–99)
Potassium: 3.7 mmol/L (ref 3.5–5.1)
Sodium: 138 mmol/L (ref 135–145)

## 2019-10-15 LAB — MAGNESIUM: Magnesium: 1.8 mg/dL (ref 1.7–2.4)

## 2019-10-15 MED ORDER — MAGNESIUM SULFATE 2 GM/50ML IV SOLN
2.0000 g | Freq: Once | INTRAVENOUS | Status: AC
  Administered 2019-10-15: 2 g via INTRAVENOUS
  Filled 2019-10-15: qty 50

## 2019-10-15 MED ORDER — HALOPERIDOL LACTATE 5 MG/ML IJ SOLN: 1.0000 mg | Freq: Four times a day (QID) | INTRAMUSCULAR | Status: DC | PRN

## 2019-10-15 MED ORDER — SENNOSIDES-DOCUSATE SODIUM 8.6-50 MG PO TABS: 2.0000 | ORAL_TABLET | Freq: Every evening | ORAL | Status: DC | PRN

## 2019-10-15 MED ORDER — POTASSIUM CHLORIDE 10 MEQ/100ML IV SOLN
10.0000 meq | INTRAVENOUS | Status: AC
  Administered 2019-10-15 (×4): 10 meq via INTRAVENOUS
  Filled 2019-10-15 (×4): qty 100

## 2019-10-15 MED ORDER — POLYETHYLENE GLYCOL 3350 17 G PO PACK: 17.0000 g | PACK | Freq: Every day | ORAL | Status: DC | PRN

## 2019-10-15 NOTE — Progress Notes (Signed)
Inpatient Rehabilitation-Admissions Coordinator   Spoke to pt's son via phone. He has confirmed a solid DC plan for support after CIR stay. AC will pursue IP Rehab for this patient. Will follow up tomorrow regarding possible admission, pending bed availability.   Cheri Rous, OTR/L  Rehab Admissions Coordinator  [phone] 10/15/2019 5:16 PM

## 2019-10-15 NOTE — Progress Notes (Signed)
Occupational Therapy Treatment Patient Details Name: Brittney Kennedy MRN: [id number] DOB: 31-Jul-1950 Today's Date: 10/15/2019    History of present illness 69 year old female to the hospital for nausea, vomiting, epigastric pain x 1 week, admitted 09/27/19 for NSTEMI. cath 3/26 revealed severe multivessel CAD. Patient transferred to [facility]. Fall 3/27 with R groin hematoma. 3/28 patient with AMS due to pain meds and rapid response 3/29 due to AMS and L facial droop with weakness. CT: R basal ganglia infarct, MRI on 10/09/19 reveals large R MCA infarct. PMH: CAD, cholecystectomy   OT comments  Goals updated, most remain appropriate, some changed to reflect progress made from previous sessions. Today Pt was mos A +2 for bed mobility to sit EOB. Sitting there she engaged in functional activity working on attention and finding objects on Left side. She is not moving eyes past midline to the left, but will perform head turns with heavy cues to look to the left. Continues with left inattention effecting her ability to sustain LLE contraction in standing. Completed stand-pivot to her right with pt unable to offload or advance either leg--required +2 max assist to pivot. OT will continue to follow acutely and Pt continues to require skilled OT at the CIR level post-acute. Next session to continue working on functional tasks, L inattention, and function in the LUE (sling utilized today, RN stated that she squeezed her hand today- but no movement for therapist)   Follow Up Recommendations  CIR;Supervision/Assistance - 24 hour    Equipment Recommendations  Wheelchair (measurements OT);Wheelchair cushion (measurements OT);Hospital bed    Recommendations for Other Services      Precautions / Restrictions Precautions Precautions: Fall Precaution Comments: aspiration precautions (dysphagia 1 with nectar), HOB >30 degrees Required Braces or Orthoses: Other Brace Other Brace: L sling to prevent further  sublaxation Restrictions Weight Bearing Restrictions: No       Mobility Bed Mobility Overal bed mobility: Needs Assistance Bed Mobility: Rolling;Sidelying to Sit Rolling: Mod assist Sidelying to sit: +2 for physical assistance;Mod assist;HOB elevated       General bed mobility comments: pt requires incr time for processing and assist for all mobility due to weakness; pt able to use RUE to assist with raising torso from side to sit; pt able to assist with scooting left hip out to EOB to get left foot on the floor  Transfers Overall transfer level: Needs assistance   Transfers: Sit to/from Stand;Stand Pivot Transfers Sit to Stand: Max assist;+2 physical assistance;+2 safety/equipment;Mod assist Stand pivot transfers: Max assist;+2 physical assistance;+2 safety/equipment       General transfer comment: from EOB at lowest height; initial sit to stand moderate assist (pt with rt hand on bedrail); noted activation in LLE in weight-bearing; second sit to stand pt with hand  on PTs arm and required incr assist; +left lean in standing and during pivot to her rt    Balance Overall balance assessment: Needs assistance Sitting-balance support: Single extremity supported;Feet supported Sitting balance-Leahy Scale: Poor Sitting balance - Comments: leans to her right with RUE for support and at times, leaning to her left against OT sitting on her left side   Standing balance support: Single extremity supported Standing balance-Leahy Scale: Poor Standing balance comment: LUE in sling for support                           ADL either performed or assessed with clinical judgement   ADL Overall ADL's : Needs assistance/impaired  Eating/Feeding: Minimal assistance;Sitting Eating/Feeding Details (indicate cue type and reason): drinking from cup Grooming: Wash/dry face;Minimal assistance;Sitting Grooming Details (indicate cue type and reason): uses R hand, cues for task              Lower Body Dressing: Maximal assistance;Bed level Lower Body Dressing Details (indicate cue type and reason): to don socks, Pt able to help with lifting legs Toilet Transfer: Maximal assistance;+2 for physical assistance;+2 for safety/equipment;Stand-pivot;Cueing for sequencing Toilet Transfer Details (indicate cue type and reason): simulated OOB to chair                 Vision   Vision Assessment?: Yes Ocular Range of Motion: Restricted on the left Alignment/Gaze Preference: Gaze right Tracking/Visual Pursuits: Impaired - to be further tested in functional context Additional Comments: Pt will track to midline, and then perform head turn to continue line of sight   Perception     Praxis      Cognition Arousal/Alertness: Awake/alert(prefers eyes closed 2/2 rt eye infection) Behavior During Therapy: Anxious Overall Cognitive Status: No family/caregiver present to determine baseline cognitive functioning Area of Impairment: Orientation;Attention;Memory;Following commands;Safety/judgement;Awareness;Problem solving                 Orientation Level: (time NT) Current Attention Level: Sustained   Following Commands: Follows one step commands with increased time Safety/Judgement: Decreased awareness of safety;Decreased awareness of deficits Awareness: Emergent Problem Solving: Slow processing;Decreased initiation;Difficulty sequencing;Requires verbal cues;Requires tactile cues General Comments: better attention to task; comments mostly on topic; making needs known (drink, need to urinate)        Exercises     Shoulder Instructions       General Comments Continues to mumble however able to slow down and articulate intelligibly with cues. See OT note for seated EOB tasks for improving left side attention    Pertinent Vitals/ Pain       Pain Assessment: No/denies pain Faces Pain Scale: No hurt Pain Intervention(s): Monitored during session  Home Living                                           Prior Functioning/Environment              Frequency  Min 2X/week        Progress Toward Goals  OT Goals(current goals can now be found in the care plan section)  Progress towards OT goals: Progressing toward goals  Acute Rehab OT Goals Patient Stated Goal: get stronger OT Goal Formulation: With patient Time For Goal Achievement: 10/29/19 Potential to Achieve Goals: Good  Plan Discharge plan remains appropriate    Co-evaluation    PT/OT/SLP Co-Evaluation/Treatment: Yes Reason for Co-Treatment: Complexity of the patient's impairments (multi-system involvement);For patient/therapist safety;To address functional/ADL transfers PT goals addressed during session: Mobility/safety with mobility;Balance OT goals addressed during session: ADL's and self-care      AM-PAC OT "6 Clicks" Daily Activity     Outcome Measure   Help from another person eating meals?: A Lot Help from another person taking care of personal grooming?: A Lot Help from another person toileting, which includes using toliet, bedpan, or urinal?: A Lot Help from another person bathing (including washing, rinsing, drying)?: A Lot Help from another person to put on and taking off regular upper body clothing?: A Lot Help from another person to put on and taking off regular lower body  clothing?: A Lot 6 Click Score: 12    End of Session    OT Visit Diagnosis: Other abnormalities of gait and mobility (R26.89);Muscle weakness (generalized) (M62.81);History of falling (Z91.81);Cognitive communication deficit (R41.841);Other symptoms and signs involving the nervous system (R29.898);Low vision, both eyes (H54.2) Symptoms and signs involving cognitive functions: Cerebral infarction   Activity Tolerance     Patient Left     Nurse Communication          Time: 2992-4268 OT Time Calculation (min): 33 min  Charges: OT General Charges $OT Visit: 1 Visit OT  Treatments $Self Care/Home Management : 8-22 mins Brittney Kennedy OTR/L Acute Rehabilitation Services Pager: [phone] Office: [phone]   Brittney Kennedy 10/15/2019, 2:27 PM

## 2019-10-15 NOTE — Progress Notes (Signed)
PROGRESS NOTE    Brittney Kennedy  SWN:[id number] DOB: [birth year] DOA: 09/27/2019 PCP: Patient, No Pcp Per   Brief Narrative:  69 year old with history of systolic CHF EF 93%, CAD, duodenal ulcer admitted for atypical chest pain.  NSTEMI showed severe multivessel disease transferred to [facility] course complicated by right groin hematoma.  During hospitalization also developed upper GI bleed secondary to duodenal ulcer, EGD done 4/6.  Started on PPI, hemoglobin remained stable.  Patient was transferred to [facility] service on 4/7.  PT recommended CIR.   Assessment & Plan:   Principal Problem:   NSTEMI (non-ST elevated myocardial infarction) ([place]) Active Problems:   Acute systolic CHF (congestive heart failure) (HCC)   Cerebral embolism with cerebral infarction   Malnutrition of moderate degree   Upper GI bleed   Status post coronary artery stent placement   Acute blood loss anemia   Duodenal ulcer  Non-STEMI, CAD Systolic congestive heart failure with a EF of 20% -Status post PCI per cardiology team, on Coreg, Plavix and statin -Seen by cardiology team.  Poor surgical candidate. -Repeat echo on 3/31 showed improved EF to 50%.  Recommending outpatient follow-up with cardiology  Right groin hematoma status post fall -Ultrasound negative for pseudoaneurysm.  Hematoma appears to be stable at the cath site.  Acute MCA stroke with left hemiplegia -Complicated during hospital stay.  Seen by neurology, outpatient follow-up in 4 weeks -Daily Plavix.  No need for aspirin -Lipitor 80 mg daily  Acute upper GI bleed secondary to duodenal ulcer -Status post EGD 4/6-endoclips placed.  Recommending PPI twice daily and outpatient follow-up  Acute urinary tract infection -Previous hospitalist discussed case with infectious disease, recommended 5 days of amoxicillin, last day 4/15.  Acute encephalopathy -Currently stable.  This is secondary to acute CVA  Dysphagia -Dysphagia 1  diet  Keratoconjunctivitis -Seen by ophthalmology recommending erythromycin ophthalmic 4 times daily X 7 days -Warm compresses.  Outpatient follow-up ophthalmology   DVT prophylaxis: SCDs Code Status: Full code Family Communication:   Disposition Plan:   Patient From= home  Patient Anticipated D/C place= SNF versus CIR  Barriers= patient is medically stable, following discussion between family and TOC team regarding patient's final disposition plan.  Patient is unsafe to be discharged home given the amount of assistance and nursing supervision she requires.  Maintain hospital stay in the meantime.   Subjective: Seen and examined at bedside, no complaints overall feels okay.  She is able to answer basic questions.  Still has some dysarthric speech but overall aware of what is going on. Son to speak with CIR team later on this evening.  Review of Systems Otherwise negative except as per HPI, including: General: Denies fever, chills, night sweats or unintended weight loss. Resp: Denies cough, wheezing, shortness of breath. Cardiac: Denies chest pain, palpitations, orthopnea, paroxysmal nocturnal dyspnea. GI: Denies abdominal pain, nausea, vomiting, diarrhea or constipation GU: Denies dysuria, frequency, hesitancy or incontinence MS: Denies muscle aches, joint pain or swelling Neuro: Denies headache, neurologic deficits (focal weakness, numbness, tingling), abnormal gait Psych: Denies anxiety, depression, SI/HI/AVH Skin: Denies new rashes or lesions ID: Denies sick contacts, exotic exposures, travel  Examination:  General exam: Appears chronically ill.  Not in acute distress. Respiratory system: Clear to auscultation. Respiratory effort normal. Cardiovascular system: S1 & S2 heard, RRR. No JVD, murmurs, rubs, gallops or clicks. No pedal edema. Gastrointestinal system: Abdomen is nondistended, soft and nontender. No organomegaly or masses felt. Normal bowel sounds heard. Central  nervous system:  Alert and oriented. No focal neurological deficits. Extremities: Right lower extremity strength 4/5, left sided strength is 3+/5 Skin: Dysarthric speech.  Alert awake oriented X 2.  Slightly confused about date Psychiatry: Judgement and insight appear normal. Mood & affect appropriate.     Objective: Vitals:   10/14/19 2346 10/15/19 0359 10/15/19 0500 10/15/19 0833  BP: (!) 152/97 (!) 150/55  (!) 167/70  Pulse: 78 76  72  Resp: 20 18  16   Temp: 98.2 F (36.8 C) 98.2 F (36.8 C)  98.1 F (36.7 C)  TempSrc: Oral Oral  Oral  SpO2: 100% 98%  100%  Weight:   58.2 kg   Height:        Intake/Output Summary (Last 24 hours) at 10/15/2019 1011 Last data filed at 10/15/2019 0400 Gross per 24 hour  Intake 635 ml  Output 1350 ml  Net -715 ml   Filed Weights   10/13/19 0311 10/14/19 0500 10/15/19 0500  Weight: 71.9 kg 73 kg 58.2 kg     Data Reviewed:   CBC: Recent Labs  Lab 10/10/19 1821 10/11/19 0406 10/12/19 0446 10/13/19 0430 10/14/19 0420  WBC 13.3* 15.1* 11.8* 9.2 9.6  HGB 8.7* 8.7* 8.5* 8.3* 8.4*  HCT 27.8* 28.2* 26.7* 27.0* 27.0*  MCV 98.6 97.9 97.1 97.8 96.4  PLT 209 198 206 199 208   Basic Metabolic Panel: Recent Labs  Lab 10/11/19 0406 10/12/19 0446 10/13/19 0430 10/14/19 0420 10/15/19 0345  NA 141 142 142 139 138  K 3.5 3.3* 4.3 3.6 3.7  CL 110 110 112* 107 103  CO2 22 21* 21* 23 25  GLUCOSE 126* 127* 114* 113* 127*  BUN 23 17 13 10 9   CREATININE 1.02* 0.91 0.81 0.87 0.81  CALCIUM 8.6* 8.7* 8.6* 8.9 9.2  MG 1.6* 1.7 2.4 1.6* 1.8   GFR: Estimated Creatinine Clearance: 53.3 mL/min (by C-G formula based on SCr of 0.81 mg/dL). Liver Function Tests: No results for input(s): AST, ALT, ALKPHOS, BILITOT, PROT, ALBUMIN in the last 168 hours. No results for input(s): LIPASE, AMYLASE in the last 168 hours. No results for input(s): AMMONIA in the last 168 hours. Coagulation Profile: No results for input(s): INR, PROTIME in the last 168  hours. Cardiac Enzymes: No results for input(s): CKTOTAL, CKMB, CKMBINDEX, TROPONINI in the last 168 hours. BNP (last 3 results) No results for input(s): PROBNP in the last 8760 hours. HbA1C: No results for input(s): HGBA1C in the last 72 hours. CBG: Recent Labs  Lab 10/14/19 0403 10/14/19 1205 10/14/19 1659 10/14/19 2114 10/15/19 0603  GLUCAP 103* 186* 116* 124* 117*   Lipid Profile: No results for input(s): CHOL, HDL, LDLCALC, TRIG, CHOLHDL, LDLDIRECT in the last 72 hours. Thyroid Function Tests: No results for input(s): TSH, T4TOTAL, FREET4, T3FREE, THYROIDAB in the last 72 hours. Anemia Panel: No results for input(s): VITAMINB12, FOLATE, FERRITIN, TIBC, IRON, RETICCTPCT in the last 72 hours. Sepsis Labs: No results for input(s): PROCALCITON, LATICACIDVEN in the last 168 hours.  No results found for this or any previous visit (from the past 240 hour(s)).       Radiology Studies: No results found.      Scheduled Meds: . amantadine  100 mg Oral BID  . amoxicillin  500 mg Oral Q8H  . atorvastatin  80 mg Oral q1800  . carvedilol  3.125 mg Oral BID WC  . chlorhexidine  15 mL Mouth Rinse BID  . clopidogrel  75 mg Oral Q breakfast  . docusate  100 mg Oral  BID  . erythromycin   Right Eye Q6H  . feeding supplement  1 Container Oral BID BM  . insulin aspart  0-15 Units Subcutaneous TID WC  . mouth rinse  15 mL Mouth Rinse q12n4p  . melatonin  3 mg Oral QHS  . pantoprazole (PROTONIX) IV  40 mg Intravenous Q12H  . sodium chloride flush  3 mL Intravenous Q12H   Continuous Infusions: . sodium chloride Stopped (10/08/19 1620)  . magnesium sulfate bolus IVPB 2 g (10/15/19 0952)  . potassium chloride       LOS: 18 days   Time spent= 20 mins    Melaya Hoselton Joline Maxcy, MD [facility]  If 7PM-7AM, please contact night-coverage  10/15/2019, 10:11 AM

## 2019-10-15 NOTE — Progress Notes (Signed)
Nutrition Follow-up  DOCUMENTATION CODES:   Non-severe (moderate) malnutrition in context of acute illness/injury  INTERVENTION:   Monitor for bowel movement, none documented since 4/7  Continue Vital Cuisine shake PO TID, each supplement provides 500 kcal and 22 grams of protein   If pt consuming well and if being properly thickened, may continue Boost Breeze po BID, each supplement provides 250 kcal and 9 grams of protein   NUTRITION DIAGNOSIS:   Moderate Malnutrition related to acute illness as evidenced by mild muscle depletion, mild fat depletion.  Ongoing.  GOAL:   Patient will meet greater than or equal to 90% of their needs  Progressing.   MONITOR:   Diet advancement, TF tolerance, Labs, Weight trends  REASON FOR ASSESSMENT:   Consult Enteral/tube feeding initiation and management  ASSESSMENT:   69 yo female admitted with NSTEMI, acute CHF and later had a change in mental status and found to have acute stroke. PMH includes PUD, seasonal allergies, cholecystectomy and left rotator cuff repair  3/26 Cardiac Cath, 3v CAD, EF 20% (new onset acute CHF) 3/28 Rapid response due to lethargy, slurred speech, CT head no acute findings 3/29 CVA: R basal infarct, small r pontine infarct 3/30 CT acute infact centered in r. Parietal lobe  4/3 MBS, Dysphagia 1, Nectar thick liquids 4/6 Rapid Response due to hypotension and dark tarry liquid stools; s/p EGD  Pt potentially going to CIR.   Pt reports appetite is improving and states that she likes the vanilla flavor shakes. Pt states her son is still coming and providing Naked juices with added protein.   Pt currently receiving Boost Breeze BID and Vital Cuisine shakes po TID. Per RN, pt loves the Vital Cuisine shakes and drinks them very well.   PO Intake: 0-100% x last 8 recorded meals (35% average intake)  UOP: 1,352ml x24 hours I/O: -3,395.34ml since admit  Labs reviewed: CBGs 116-186 Medications reviewed and  include: ss Novolog, Colace, IV Magnesium sulfate, IV KCl  Diet Order:   Diet Order            DIET - DYS 1 Room service appropriate? Yes; Fluid consistency: Nectar Thick  Diet effective now              EDUCATION NEEDS:   Not appropriate for education at this time  Skin:  Skin Assessment: Reviewed RN Assessment  Last BM:  4/7  Height:   Ht Readings from Last 1 Encounters:  09/27/19 5\' 2"  (1.575 m)    Weight:   Wt Readings from Last 1 Encounters:  10/15/19 58.2 kg    BMI:  Body mass index is 23.47 kg/m.  Estimated Nutritional Needs:   Kcal:  1500-1700 kcals  Protein:  75-85 g  Fluid:  >/= 1.5 L   10/17/19, MS, RD, LDN RD pager number and weekend/on-call pager number located in [place].

## 2019-10-15 NOTE — Progress Notes (Signed)
Physical Therapy Treatment Patient Details Name: Brittney Kennedy MRN: [id number] DOB: 1950/11/30 Today's Date: 10/15/2019    History of Present Illness 69 year old female to the hospital for nausea, vomiting, epigastric pain x 1 week, admitted 09/27/19 for NSTEMI. cath 3/26 revealed severe multivessel CAD. Patient transferred to [facility]. Fall 3/27 with R groin hematoma. 3/28 patient with AMS due to pain meds and rapid response 3/29 due to AMS and L facial droop with weakness. CT: R basal ganglia infarct, MRI on 10/09/19 reveals large R MCA infarct. PMH: CAD, cholecystectomy    PT Comments    Patient cooperative and attending to task improved. Continues with left inattention effecting her ability to sustain LLE contraction in standing. Completed stand-pivot to her right with pt unable to offload or advance either leg--required +2 max assist to pivot.    Follow Up Recommendations  CIR     Equipment Recommendations  Other (comment)(TBA)    Recommendations for Other Services       Precautions / Restrictions Precautions Precautions: Fall Precaution Comments: aspiration precautions (dysphagia 1 with nectar), HOB >30 degrees Required Braces or Orthoses: Other Brace Other Brace: L sling to prevent further sublaxation Restrictions Weight Bearing Restrictions: No    Mobility  Bed Mobility Overal bed mobility: Needs Assistance Bed Mobility: Rolling;Sidelying to Sit Rolling: Mod assist Sidelying to sit: +2 for physical assistance;Mod assist;HOB elevated       General bed mobility comments: pt requires incr time for processing and assist for all mobility due to weakness; pt able to use RUE to assist with raising torso from side to sit; pt able to assist with scooting left hip out to EOB to get left foot on the floor  Transfers Overall transfer level: Needs assistance   Transfers: Sit to/from Stand;Stand Pivot Transfers Sit to Stand: Max assist;+2 physical assistance;+2 safety/equipment;Mod  assist Stand pivot transfers: Max assist;+2 physical assistance;+2 safety/equipment       General transfer comment: from EOB at lowest height; initial sit to stand moderate assist (pt with rt hand on bedrail); noted activation in LLE in weight-bearing; second sit to stand pt with hand  on PTs arm and required incr assist; +left lean in standing and during pivot to her rt  Ambulation/Gait             General Gait Details: not yet appropriate   Stairs             Wheelchair Mobility    Modified Rankin (Stroke Patients Only) Modified Rankin (Stroke Patients Only) Pre-Morbid Rankin Score: No symptoms Modified Rankin: Severe disability     Balance Overall balance assessment: Needs assistance Sitting-balance support: Single extremity supported;Feet supported Sitting balance-Leahy Scale: Poor Sitting balance - Comments: leans to her right with RUE for support and at times, leaning to her left against OT sitting on her left side   Standing balance support: Single extremity supported Standing balance-Leahy Scale: Poor Standing balance comment: LUE in sling for support                            Cognition Arousal/Alertness: Awake/alert(prefers eyes closed 2/2 rt eye infection) Behavior During Therapy: Anxious Overall Cognitive Status: No family/caregiver present to determine baseline cognitive functioning Area of Impairment: Orientation;Attention;Memory;Following commands;Safety/judgement;Awareness;Problem solving                 Orientation Level: (time NT) Current Attention Level: Sustained   Following Commands: Follows one step commands with increased time Safety/Judgement:  Decreased awareness of safety;Decreased awareness of deficits Awareness: Emergent Problem Solving: Slow processing;Decreased initiation;Difficulty sequencing;Requires verbal cues;Requires tactile cues General Comments: better attention to task; comments mostly on topic; making  needs known (drink, need to urinate)      Exercises      General Comments General comments (skin integrity, edema, etc.): Continues to mumble however able to slow down and articulate intelligibly with cues. See OT note for seated EOB tasks for improving left side attention      Pertinent Vitals/Pain Pain Assessment: No/denies pain Faces Pain Scale: No hurt    Home Living                      Prior Function            PT Goals (current goals can now be found in the care plan section) Acute Rehab PT Goals Patient Stated Goal: get stronger Time For Goal Achievement: 10/28/19 Potential to Achieve Goals: Good Progress towards PT goals: Progressing toward goals    Frequency    Min 4X/week      PT Plan Current plan remains appropriate    Co-evaluation PT/OT/SLP Co-Evaluation/Treatment: Yes Reason for Co-Treatment: Complexity of the patient's impairments (multi-system involvement);For patient/therapist safety;To address functional/ADL transfers          AM-PAC PT "6 Clicks" Mobility   Outcome Measure  Help needed turning from your back to your side while in a flat bed without using bedrails?: A Lot Help needed moving from lying on your back to sitting on the side of a flat bed without using bedrails?: A Lot Help needed moving to and from a bed to a chair (including a wheelchair)?: A Lot Help needed standing up from a chair using your arms (e.g., wheelchair or bedside chair)?: A Lot Help needed to walk in hospital room?: Total Help needed climbing 3-5 steps with a railing? : Total 6 Click Score: 10    End of Session Equipment Utilized During Treatment: Gait belt Activity Tolerance: Patient limited by fatigue Patient left: in chair;with call bell/phone within reach;with chair alarm set Nurse Communication: Mobility status;Need for lift equipment(recommend stedy for back to bed) PT Visit Diagnosis: Other abnormalities of gait and mobility (R26.89);Muscle  weakness (generalized) (M62.81);Hemiplegia and hemiparesis;Other symptoms and signs involving the nervous system (R29.898) Hemiplegia - Right/Left: Left Hemiplegia - dominant/non-dominant: Non-dominant Hemiplegia - caused by: Cerebral infarction     Time: 2694-8546 PT Time Calculation (min) (ACUTE ONLY): 30 min  Charges:  $Therapeutic Activity: 8-22 mins                      Jerolyn Center, PT Pager [phone]    Zena Amos 10/15/2019, 12:45 PM

## 2019-10-15 NOTE — H&P (Signed)
Physical Medicine and Rehabilitation Admission H&P     HPI: Brittney Kennedy is a 69 year old right-handed female with unremarkable past medical history.  History taken from chart review due to distraction. Patient was independent prior to admission 1 level home.  She lives in the [place] area with a friend.  Family planning assistance as needed on discharge.  She presented on 09/27/2019 with intermittent nausea/vomitting x1 week as well as epigastric substernal pain.  Admission chemistries potassium 3.0, troponin high-sensitivity 674, SARS coronavirus negative.  EKG question old septal infarct with lateral ST-T changes CT abdomen pelvis negative for acute intra-abdominal or pelvic pathology.  She was placed on IV heparin.  Cardiac catheterization revealed severe multivessel CAD with LV dysfunction, ejection fraction of 25%. Patient was stented. Acute systolic heart failure due to an ICM and follow-up management per cardiology services.  Patient did develop right groin hematoma after catheterization and currently maintained on Lovenox as well as low-dose aspirin.  Neurology consulted on 09/30/19 for lethargy, left facial droop, left upper extremity weakness.  Cranial CT showed right basal ganglia infarction as well as remote appearing right pontine infarction.  CT angiogram of the neck moderate stenosis distal right M1 segment.  Moderate to severe stenosis inferior branch of right middle cerebral artery.  No large vessel occlusion.  Follow-up CT of the head 10/01/2019 showed acute infarct right parietal lobe age-indeterminate infarct right caudate.  EEG negative for seizures.  Plavix was added to patient's prophylaxis for CVA.  Gastroenterology services consulted 10/08/2019 for melena BUN markedly elevated 49-50, hemoglobin dipping to 8.7 down to 7.3, WBC 17,700.  EGD upper endoscopy completed 10/08/2019 per Dr. Havery Moros showed actively bleeding duodenal ulcer with visible vessel treated with epi and hemostasis  clips x5.  Latest hemoglobin stable 8.4 on 4/12 and renal function improved BUN 9 creatinine 0.81.  GI had recommended PPI twice daily and outpatient follow-up.  Patient's aspirin is been discontinued and currently maintained on Plavix.  Hospital course further complicated by UTI, urine culture greater 100,000 Aerococcus viridans and patient completed 5-day course of amoxicillin.  Ophthalmology consulted 10/12/2019 Dr. Dennison Mascot for keratoconjunctivitis who recommended erythromycin ophthalmic 4 times daily x7 days with warm compresses.  Patient is currently on a dysphagia #1 nectar thick liquid diet.  Therapy evaluations completed and patient was admitted for a comprehensive rehab program. Please see preadmission assessment from earlier today as well.   Review of Systems  Constitutional: Positive for malaise/fatigue. Negative for chills and fever.  HENT: Negative for hearing loss.   Eyes: Negative for blurred vision and double vision.  Respiratory: Negative for cough and shortness of breath.   Cardiovascular: Negative for palpitations and leg swelling.  Gastrointestinal: Positive for constipation, nausea and vomiting.  Genitourinary: Negative for dysuria, flank pain and hematuria.  Musculoskeletal: Positive for myalgias.  Skin: Negative for rash.  Neurological: Positive for focal weakness and weakness. Negative for sensory change and seizures.   Past Medical History:  Diagnosis Date  . CAD (coronary artery disease)    a. 09/2019 NSTEMI/Cath: LM nl, LAD 90p, 62m diffuse mid-dist dzs, LCX 993mV groove/OM2, RI 90p, RCA dominant, nl, RPDA 9081mF 25%, glob HK.  . HMarland KitchenrEF (heart failure with reduced ejection fraction) (HCCTurin  a. 09/2019 LV gram: EF 25%, global HK.  . PMarland KitchenD (peptic ulcer disease)   . Seasonal allergies    Past Surgical History:  Procedure Laterality Date  . CHOLECYSTECTOMY    . CORONARY STENT INTERVENTION N/A 09/30/2019  Procedure: CORONARY STENT INTERVENTION;  Surgeon: Martinique,  Peter M, MD;  Location: [facility];  Service: Cardiovascular;  Laterality: N/A;  . ESOPHAGOGASTRODUODENOSCOPY (EGD) WITH PROPOFOL N/A 10/08/2019   Procedure: ESOPHAGOGASTRODUODENOSCOPY (EGD) WITH PROPOFOL;  Surgeon: Yetta Flock, MD;  Location: [facility];  Service: Gastroenterology;  Laterality: N/A;  . HEMOSTASIS CLIP PLACEMENT  10/08/2019   Procedure: HEMOSTASIS CLIP PLACEMENT;  Surgeon: Yetta Flock, MD;  Location: [facility];  Service: Gastroenterology;;  . HEMOSTASIS CONTROL  10/08/2019   Procedure: HEMOSTASIS CONTROL;  Surgeon: Yetta Flock, MD;  Location: [facility];  Service: Gastroenterology;;  . LEFT HEART CATH AND CORONARY ANGIOGRAPHY N/A 09/30/2019   Procedure: LEFT HEART CATH AND CORONARY ANGIOGRAPHY;  Surgeon: Martinique, Peter M, MD;  Location: [facility];  Service: Cardiovascular;  Laterality: N/A;  . LEFT HEART CATH AND CORS/GRAFTS ANGIOGRAPHY N/A 09/27/2019   Procedure: LEFT HEART CATH AND CORS/GRAFTS ANGIOGRAPHY;  Surgeon: Minna Merritts, MD;  Location: [facility];  Service: Cardiovascular;  Laterality: N/A;   Family History  Problem Relation Age of Onset  . Stroke Mother        died @ 49  . Multiple myeloma Father        died @ 29  . Multiple myeloma Sister    Social History:  reports that she has never smoked. She has never used smokeless tobacco. She reports that she does not drink alcohol or use drugs. Allergies:  Allergies  Allergen Reactions  . Onion Anaphylaxis and Hives  . Monosodium Glutamate Nausea Only    Headache Fatigue   Medications Prior to Admission  Medication Sig Dispense Refill  . acetaminophen (TYLENOL) 500 MG tablet Take 500 mg by mouth every 6 (six) hours as needed for mild pain.    Marland Kitchen acetaminophen (TYLENOL) 650 MG CR tablet Take 1,300 mg by mouth in the morning and at bedtime.    . B Complex-C (B-COMPLEX WITH VITAMIN C) tablet Take 1 tablet by mouth daily.    Marland Kitchen Bioflavonoid Products (ESTER C  PO) Take 1 tablet by mouth daily.    . Biotin w/ Vitamins C & E (HAIR/SKIN/NAILS PO) Take 3 capsules by mouth daily.    . Calcium Carbonate-Vit D-Min (CALCIUM-VITAMIN D-MINERALS) 600-800 MG-UNIT CHEW Chew by mouth.    . diphenhydrAMINE (BENADRYL) 25 MG tablet Take 50 mg by mouth at bedtime.    Marland Kitchen DM-Doxylamine-Acetaminophen (VICKS NYQUIL COLD & FLU PO) Take 2 capsules by mouth at bedtime. For allergies    . DM-Phenylephrine-Acetaminophen (VICKS DAYQUIL COLD & FLU) 10-5-325 MG CAPS Take 2 capsules by mouth daily. For allergies    . docusate sodium (COLACE) 100 MG capsule Take 100 mg by mouth 2 (two) times daily.    . famotidine-calcium carbonate-magnesium hydroxide (PEPCID COMPLETE) 10-800-165 MG chewable tablet Chew 1 tablet by mouth daily as needed (acid reflux).    . Ginkgo Biloba 120 MG CAPS Take 120 mg by mouth daily.    Marland Kitchen glucosamine-chondroitin 500-400 MG tablet Take 2 tablets by mouth daily.    . hydrocortisone cream 1 % Apply 1 application topically 2 (two) times daily as needed for itching.    . Melatonin 10 MG CAPS Take 20 mg by mouth at bedtime.    . Multiple Vitamin (MULTIVITAMIN WITH MINERALS) TABS tablet Take 1 tablet by mouth daily.    Marland Kitchen neomycin-bacitracin-polymyxin (NEOSPORIN) [phone] ointment Apply 1 application topically 3 (three) times daily as needed (wound care).    Marland Kitchen omeprazole (PRILOSEC) 20  MG capsule Take 20 mg by mouth daily.    Marland Kitchen OVER THE COUNTER MEDICATION Take 1 capsule by mouth daily. Echinacea & Goldenseal    . phenylephrine (SUDAFED PE) 10 MG TABS tablet Take 10 mg by mouth daily.     . psyllium (REGULOID) 0.52 g capsule Take 1.04 g by mouth daily.    . ranitidine (ZANTAC) 150 MG capsule Take 150 mg by mouth 2 (two) times daily as needed for heartburn.    . Simethicone 125 MG TABS Take 250 mg by mouth daily.      Drug Regimen Review  Drug regimen was reviewed and remains appropriate with no significant issues identified  Home: Home Living Family/patient  expects to be discharged to:: Private residence Living Arrangements: Non-relatives/Friends Available Help at Discharge: Friend(s) Type of Home: House Home Access: Stairs to enter Technical brewer of Steps: curb Entrance Stairs-Rails: None Home Layout: One level Bathroom Shower/Tub: Multimedia programmer: Standard Home Equipment: None   Functional History: Prior Function Level of Independence: Independent Comments: per chart pt has not been limited physically  Functional Status:  Mobility: Bed Mobility Overal bed mobility: Needs Assistance Bed Mobility: Rolling, Sidelying to Sit Rolling: Mod assist Sidelying to sit: Mod assist(up from her rt side) Supine to sit: Total assist, +2 for safety/equipment, +2 for physical assistance, HOB elevated Sit to supine: Max assist Sit to sidelying: Max assist, +2 for physical assistance, +2 for safety/equipment General bed mobility comments: pt requires incr time for processing and assist for all mobility due to weakness; pt able to use RUE to assist with raising torso from side to sit; pt able to assist with scooting left hip out to EOB to get left foot on the floor Transfers Overall transfer level: Needs assistance Equipment used: Ambulation equipment used Transfer via Lift Equipment: Stedy Transfers: Sit to/from Stand, Risk manager Sit to Stand: +2 physical assistance, +2 safety/equipment, Mod assist Stand pivot transfers: +2 physical assistance, +2 safety/equipment, Mod assist General transfer comment: initiating standing much better; transferred x 4 requiring increased assist as she fatigued (on/off BSC; standing for pericare) Ambulation/Gait Ambulation/Gait assistance: (unable.) Gait Distance (Feet): 1 Feet Assistive device: None Gait Pattern/deviations: Decreased stride length, Step-to pattern, Wide base of support General Gait Details: not yet appropriate Gait velocity: slow, controlled     ADL: ADL Overall ADL's : Needs assistance/impaired Eating/Feeding: Minimal assistance, Sitting Eating/Feeding Details (indicate cue type and reason): drinking from cup Grooming: Wash/dry face, Minimal assistance, Sitting Grooming Details (indicate cue type and reason): uses R hand, cues for task Upper Body Bathing: Sitting, Moderate assistance Lower Body Bathing: Sit to/from stand, Maximal assistance, +2 for physical assistance Upper Body Dressing : Moderate assistance, Sitting Lower Body Dressing: Maximal assistance, Bed level Lower Body Dressing Details (indicate cue type and reason): to don socks, Pt able to help with lifting legs Toilet Transfer: Maximal assistance, +2 for physical assistance, +2 for safety/equipment, Stand-pivot, Cueing for sequencing Toilet Transfer Details (indicate cue type and reason): simulated OOB to chair Functional mobility during ADLs: Moderate assistance, +2 for safety/equipment, +2 for physical assistance(to recliner) General ADL Comments: pt unable to progress past stand pivot to chair with total (A) to block L LE. pt reliant on staff for transfer  Cognition: Cognition Overall Cognitive Status: No family/caregiver present to determine baseline cognitive functioning Arousal/Alertness: Lethargic Orientation Level: Oriented to person, Oriented to place, Disoriented to time, Oriented to situation Attention: Focused, Sustained Focused Attention: Impaired Focused Attention Impairment: Verbal basic Sustained Attention: Impaired Sustained  Attention Impairment: Verbal basic, Verbal complex Memory: Impaired Memory Impairment: Retrieval deficit, Decreased recall of new information Cognition Arousal/Alertness: Awake/alert(prefers eyes closed 2/2 rt eye infection) Behavior During Therapy: Restless Overall Cognitive Status: No family/caregiver present to determine baseline cognitive functioning Area of Impairment: Orientation, Attention, Memory, Following  commands, Safety/judgement, Awareness, Problem solving Orientation Level: Time, Situation("July" "I'm weak on the left, but I didn't have a stroke") Current Attention Level: Sustained Memory: Decreased short-term memory Following Commands: Follows one step commands with increased time Safety/Judgement: Decreased awareness of safety, Decreased awareness of deficits Awareness: Intellectual Problem Solving: Slow processing, Decreased initiation, Difficulty sequencing, Requires verbal cues, Requires tactile cues General Comments: more confused today; speaking of a stack of paperwork I should have on my desk, "your boss is mean" Difficult to assess due to: Impaired communication(Difficult to understand at times)  Physical Exam: Blood pressure 119/64, pulse 70, temperature 98.5 F (36.9 C), temperature source Oral, resp. rate 18, height _0  (1.575 m), weight 58.2 kg, SpO2 100 %. Physical Exam  Vitals reviewed. Constitutional: She appears well-developed and well-nourished.  HENT:  Head: Normocephalic and atraumatic.  Eyes: Right eye exhibits no discharge. Left eye exhibits no discharge.  Keeps eyes closed  Neck: No tracheal deviation present. No thyromegaly present.  Respiratory: Effort normal. No respiratory distress.  GI: Soft. She exhibits no distension.  Musculoskeletal:     Comments: No edema or tenderness in extremities  Neurological: She is alert.  She does follow some simple commands providing her name and age.   A&Ox1 Dysarthria Facial weakness Limited medical historian.   Examination was limited as she continued to demand to go back to bed and lack of participation. Distracted Limited eye contact Motor: RUE: 5/5 proximal to distal LUE: 0/5 proximal to distal B/l LE: ?2/5 proximal to distal (limited due to participation)  Skin: Skin is warm and dry.  Psychiatric: Her affect is blunt. Her speech is delayed. She is slowed.    Results for orders placed or performed during  the hospital encounter of 09/27/19 (from the past 48 hour(s))  Glucose, capillary     Status: Abnormal   Collection Time: 10/14/19  4:59 PM  Result Value Ref Range   Glucose-Capillary 116 (H) 70 - 99 mg/dL    Comment: Glucose reference range applies only to samples taken after fasting for at least 8 hours.   Comment 1 Notify RN    Comment 2 Document in Chart   Glucose, capillary     Status: Abnormal   Collection Time: 10/14/19  9:14 PM  Result Value Ref Range   Glucose-Capillary 124 (H) 70 - 99 mg/dL    Comment: Glucose reference range applies only to samples taken after fasting for at least 8 hours.  Magnesium     Status: None   Collection Time: 10/15/19  3:45 AM  Result Value Ref Range   Magnesium 1.8 1.7 - 2.4 mg/dL    Comment: Performed at [facility] [address] [place], [place] [address]  Basic metabolic panel     Status: Abnormal   Collection Time: 10/15/19  3:45 AM  Result Value Ref Range   Sodium 138 135 - 145 mmol/L   Potassium 3.7 3.5 - 5.1 mmol/L   Chloride 103 98 - 111 mmol/L   CO2 25 22 - 32 mmol/L   Glucose, Bld 127 (H) 70 - 99 mg/dL    Comment: Glucose reference range applies only to samples taken after fasting for at least 8 hours.  BUN 9 8 - 23 mg/dL   Creatinine, Ser 0.81 0.44 - 1.00 mg/dL   Calcium 9.2 8.9 - 10.3 mg/dL   GFR calc non Af Amer >60 >60 mL/min   GFR calc Af Amer >60 >60 mL/min   Anion gap 10 5 - 15    Comment: Performed at [facility] [address] [place], [place] [address]  Glucose, capillary     Status: Abnormal   Collection Time: 10/15/19  6:03 AM  Result Value Ref Range   Glucose-Capillary 117 (H) 70 - 99 mg/dL    Comment: Glucose reference range applies only to samples taken after fasting for at least 8 hours.  Glucose, capillary     Status: Abnormal   Collection Time: 10/15/19 11:53 AM  Result Value Ref Range   Glucose-Capillary 164 (H) 70 - 99 mg/dL    Comment: Glucose reference range applies only to samples  taken after fasting for at least 8 hours.  Glucose, capillary     Status: Abnormal   Collection Time: 10/15/19  4:57 PM  Result Value Ref Range   Glucose-Capillary 137 (H) 70 - 99 mg/dL    Comment: Glucose reference range applies only to samples taken after fasting for at least 8 hours.  Glucose, capillary     Status: Abnormal   Collection Time: 10/15/19  9:42 PM  Result Value Ref Range   Glucose-Capillary 134 (H) 70 - 99 mg/dL    Comment: Glucose reference range applies only to samples taken after fasting for at least 8 hours.   Comment 1 Notify RN    Comment 2 Document in Chart   Magnesium     Status: None   Collection Time: 10/16/19  4:01 AM  Result Value Ref Range   Magnesium 1.8 1.7 - 2.4 mg/dL    Comment: Performed at [facility], [place] [address] [place], [place] [address]  Basic metabolic panel     Status: Abnormal   Collection Time: 10/16/19  4:01 AM  Result Value Ref Range   Sodium 138 135 - 145 mmol/L   Potassium 3.8 3.5 - 5.1 mmol/L   Chloride 104 98 - 111 mmol/L   CO2 22 22 - 32 mmol/L   Glucose, Bld 122 (H) 70 - 99 mg/dL    Comment: Glucose reference range applies only to samples taken after fasting for at least 8 hours.   BUN 8 8 - 23 mg/dL   Creatinine, Ser 0.79 0.44 - 1.00 mg/dL   Calcium 9.4 8.9 - 10.3 mg/dL   GFR calc non Af Amer >60 >60 mL/min   GFR calc Af Amer >60 >60 mL/min   Anion gap 12 5 - 15    Comment: Performed at [facility] [address] [place], [place] [address]  Glucose, capillary     Status: Abnormal   Collection Time: 10/16/19  6:37 AM  Result Value Ref Range   Glucose-Capillary 112 (H) 70 - 99 mg/dL    Comment: Glucose reference range applies only to samples taken after fasting for at least 8 hours.  Glucose, capillary     Status: Abnormal   Collection Time: 10/16/19 11:52 AM  Result Value Ref Range   Glucose-Capillary 153 (H) 70 - 99 mg/dL    Comment: Glucose reference range applies only to samples taken after fasting  for at least 8 hours.   No results found.     Medical Problem List and Plan: 1.  Altered mental status  left side weakness secondary to right MCA infarction along with a small left CR and splenium infarct embolic pattern likely due to transient cardiomyopathy with low EF after cardiac catheterization for non-STEMI with stenting complicated by groin hematoma  -patient may shower  -ELOS/Goals: 15-19 days/Supervision/Min A  Admit to CIR 2.  Antithrombotics: -DVT/anticoagulation: SCDs  -antiplatelet therapy: Plavix 3. Pain Management: Tylenol as needed 4. Mood: Amantadine 100 mg twice daily, melatonin 3 mg nightly  -antipsychotic agents: N/A 5. Neuropsych: This patient is not capable of making decisions on her own behalf. 6. Skin/Wound Care: Routine skin checks 7. Fluids/Electrolytes/Nutrition: Routine in and outs. CMP ordered for tomorrow 8.  GI bleed/duodenal ulcer.  Status post EGD 10/08/2019 with epi and hemostasis clips x5.  PPI twice daily  See #9 9.  Acute blood loss anemia.  CBC ordered for tomorrow 10.  AKI.  Resolved.  CMP ordered for tomorrow 11.  Acute urinary tract infection.  Patient completed 5-day course amoxicillin 10/17/2019 12.  Post stroke dysphagia.  Dysphagia #1 nectar liquids.  Follow-up speech therapy  Advance diet as tolerated 13.  Keratoconjunctivitis.  Follow-up ophthalmology services.  Erythromycin 4 times a day x7 days initiated 10/12/19. 14.  Hypertension.  Coreg 3.125 mg twice daily.    Monitor with increased mobility 15.  Hyperlipidemia.  Lipitor  Completed 16.  Prediabetes.  Hemoglobin A1c 5.9.  SSI  Monitor with increased mobility 17. Acute systolic CHF  Daily weight  Cathlyn Parsons, PA-C 10/16/2019   I have personally performed a face to face diagnostic evaluation, including, but not limited to relevant history and physical exam findings, of this patient and developed relevant assessment and plan.  Additionally, I have reviewed and concur with the  physician assistant's documentation above.  Delice Lesch, MD, ABPMR

## 2019-10-15 NOTE — PMR Pre-admission (Signed)
PMR Admission Coordinator Pre-Admission Assessment  Patient: Brittney Kennedy is an 69 y.o., female MRN: [id number] DOB: [birth year] Height: 5' 2"  (157.5 cm) Weight: 58.2 kg              Insurance Information HMO:     PPO:      PCP:      IPA:      80/20: yes     OTHER:  PRIMARY: Medicare A and B      Policy#: [id number]      Subscriber: Patient CM Name:       Phone#:      Fax#:  Pre-Cert#:       Employer:  Benefits:  Phone #:      Name: verified eligibility online via [employer] on 10/15/19 Eff. Date: Part A effective 06/03/17, Part B effective 01/02/19     Deduct: $1,484      Out of Pocket Max: NA      Life Max: NA CIR: Covered per Medicare guidelines once yearly deductible has been met      SNF: days 1-20, 100%, days 21-100, 80% Outpatient: 80%     Co-Pay: 20% Home Health: 100%      Co-Pay:  DME: 80%     Co-Pay: 20% Providers: Pt's choice SECONDARY: Aetna/Aetna senior supplemental insurance      Policy#: [id number]      Subscriber: patient CM Name:       Phone#:      Fax#:  Pre-Cert#:       Employer:  Benefits:  Phone #: none listed      Name:  Eff. Date:     Deduct:       Out of Pocket Max:       Life Max:  CIR:       SNF:  Outpatient:      Co-Pay:  Home Health:       Co-Pay:  DME:      Co-Pay:  Medicaid Application Date:       Case Manager:  Disability Application Date:       Case Worker:   The "Data Collection Information Summary" for patients in Inpatient Rehabilitation Facilities with attached "Privacy Act [place] Records" was provided and verbally reviewed with: Patient and Family  Emergency Contact Information Contact Information    Name Relation Home Work Mobile   [place] Son [phone]  [phone]   Assunta Curtis [phone]  [phone]     Current Medical History  Patient Admitting Diagnosis: Large Right MCA infarct with left hemiplegia, neglect, anarthria, dysphagia and cognitive deficits  History of Present Illness: Brittney Kennedy is a  69 year old female with unremarkable past medical history.  Per chart review patient independent prior to admission 1 level home.  She lives in the [place] area with a friend.  Family planning assistance as needed on discharge.  Presented 09/27/2019 with intermittent nausea and vomiting over the past week as well as epigastric substernal pain.  Admission chemistries potassium 3.0, troponin high-sensitivity 674, SARS coronavirus negative.  EKG question old septal infarct with lateral ST-T changes CT abdomen pelvis negative for acute intra-abdominal or pelvic pathology.  She was placed on intravenous heparin.  Cardiac catheterization revealed severe multivessel CAD with LV dysfunction ejection fraction 25% and patient was stented.  Acute systolic heart failure due to an ICM and follow-up management per cardiology services.  Patient did develop right groin hematoma after catheterization and currently maintained on Lovenox as well as low-dose aspirin.  Neurology services consulted 09/30/2019  for lethargy new left facial droop left upper extremity weakness.  Cranial CT scan showed right basal ganglia infarction as well as small remote appearing right pontine infarction.  CT angiogram of the neck moderate stenosis distal right M1 segment.  Moderate to severe stenosis inferior branch of right middle cerebral artery.  No large vessel occlusion.  Follow-up CT of the head 10/01/2019 showed acute infarct right parietal lobe age-indeterminate infarct right caudate.  EEG negative for seizure.  Plavix was added to patient's prophylaxis for CVA.  Gastroenterology services consulted 10/08/2019 for melena BUN markedly elevated 49-50, hemoglobin dipping to 8.7 down to 7.3, WBC 17,700.  EGD upper endoscopy completed 10/08/2019 per Dr. Havery Moros showed actively bleeding duodenal ulcer with visible vessel treated with epi and hemostasis clips x5.  Latest hemoglobin stable 8.4 and renal function improved BUN 9 creatinine 0.81.  GI had  recommended PPI twice daily and outpatient follow-up.  Patient's aspirin is been discontinued and currently maintained on Plavix.  Hospital course UTI urine culture greater 100,000 Aerococcus viridans and patient completed 5-day course of amoxicillin.  Ophthalmology consulted 10/12/2019 Dr. Dennison Mascot for keratoconjunctivitis who recommended erythromycin ophthalmic 4 times daily x7 days with warm compresses.  Patient is currently on a dysphagia #1 nectar thick liquid diet. Therapy evaluations completed and patient is to be admitted for a comprehensive rehab program on 10/16/19.  Complete NIHSS TOTAL: 11 Glasgow Coma Scale Score: 14  Past Medical History  Past Medical History:  Diagnosis Date  . CAD (coronary artery disease)    a. 09/2019 NSTEMI/Cath: LM nl, LAD 90p, 42m diffuse mid-dist dzs, LCX 929mV groove/OM2, RI 90p, RCA dominant, nl, RPDA 9042mF 25%, glob HK.  . HMarland KitchenrEF (heart failure with reduced ejection fraction) [place]  a. 09/2019 LV gram: EF 25%, global HK.  . PMarland KitchenD (peptic ulcer disease)   . Seasonal allergies     Family History  family history includes Multiple myeloma in her father and sister; Stroke in her mother.  Prior Rehab/Hospitalizations:  Has the patient had prior rehab or hospitalizations prior to admission? No  Has the patient had major surgery during 100 days prior to admission? Yes  Current Medications   Current Facility-Administered Medications:  .  0.9 %  sodium chloride infusion, 250 mL, Intravenous, PRN, SimLyda Jester PA-C, Stopped at 10/08/19 1620 .  acetaminophen (TYLENOL) tablet 650 mg, 650 mg, Oral, Q4H PRN, 650 mg at 10/12/19 1104 **OR** acetaminophen (TYLENOL) suppository 650 mg, 650 mg, Rectal, Q4H PRN, Bensimhon, DanShaune PascalD .  amantadine (SYMMETREL) capsule 100 mg, 100 mg, Oral, BID, SetGarvin FilaD, 100 mg at 10/16/19 0825 .  amoxicillin (AMOXIL) capsule 500 mg, 500 mg, Oral, Q8H, Hongalgi, Anand D, MD, 500 mg at 10/16/19 [id number]  atorvastatin (LIPITOR) tablet 80 mg, 80 mg, Oral, q1800, Bensimhon, DanShaune PascalD, 80 mg at 10/15/19 1713 .  carvedilol (COREG) tablet 3.125 mg, 3.125 mg, Oral, BID WC, Hongalgi, Anand D, MD, 3.125 mg at 10/16/19 0825 .  chlorhexidine (PERIDEX) 0.12 % solution 15 mL, 15 mL, Mouth Rinse, BID, SimLyda Jester PA-C, 15 mL at 10/16/19 0826 .  clopidogrel (PLAVIX) tablet 75 mg, 75 mg, Oral, Q breakfast, Bensimhon, DanShaune PascalD, 75 mg at 10/16/19 0825 .  docusate (COLACE) 50 MG/5ML liquid 100 mg, 100 mg, Oral, BID, Hongalgi, Anand D, MD, 100 mg at 10/16/19 0826 .  erythromycin ophthalmic ointment, , Right Eye, Q6H, HonModena JanskyD, Given at 10/16/19 [phone]  feeding supplement (BOOST / RESOURCE BREEZE) liquid 1 Container, 1 Container, Oral, BID BM, Bensimhon, Shaune Pascal, MD, 1 Container at 10/16/19 [phone] .  haloperidol lactate (HALDOL) injection 1 mg, 1 mg, Intravenous, Q6H PRN, Opyd, Timothy S, MD .  insulin aspart (novoLOG) injection 0-15 Units, 0-15 Units, Subcutaneous, TID WC, Simmons, Brittainy M, PA-C, 2 Units at 10/15/19 1712 .  MEDLINE mouth rinse, 15 mL, Mouth Rinse, q12n4p, Lyda Jester M, PA-C, 15 mL at 10/15/19 1713 .  melatonin tablet 3 mg, 3 mg, Oral, QHS, Hongalgi, Anand D, MD, 3 mg at 10/15/19 2258 .  nitroGLYCERIN (NITROSTAT) SL tablet 0.4 mg, 0.4 mg, Sublingual, Q5 min PRN, Lyda Jester M, PA-C, 0.4 mg at 09/30/19 1216 .  ondansetron (ZOFRAN) injection 4 mg, 4 mg, Intravenous, Q6H PRN, Simmons, Brittainy M, PA-C .  pantoprazole (PROTONIX) injection 40 mg, 40 mg, Intravenous, Q12H, Hongalgi, Anand D, MD, 40 mg at 10/16/19 0826 .  polyethylene glycol (MIRALAX / GLYCOLAX) packet 17 g, 17 g, Oral, Daily PRN, Amin, Ankit Chirag, MD .  polyvinyl alcohol (LIQUIFILM TEARS) 1.4 % ophthalmic solution 1 drop, 1 drop, Both Eyes, TID PRN, Modena Jansky, MD, 1 drop at 10/15/19 1053 .  Resource ThickenUp Clear, , Oral, PRN, Bensimhon, Shaune Pascal, MD .  senna-docusate (Senokot-S)  tablet 2 tablet, 2 tablet, Oral, QHS PRN, Amin, Ankit Chirag, MD .  sodium chloride flush (NS) 0.9 % injection 3 mL, 3 mL, Intravenous, Q12H, Simmons, Brittainy M, PA-C, 3 mL at 10/16/19 0827  Patients Current Diet:  Diet Order            DIET - DYS 1 Room service appropriate? Yes; Fluid consistency: Nectar Thick  Diet effective now              Precautions / Restrictions Precautions Precautions: Fall Precaution Comments: aspiration precautions (dysphagia 1 with nectar), HOB >30 degrees Other Brace: L sling to prevent further sublaxation Restrictions Weight Bearing Restrictions: No   Has the patient had 2 or more falls or a fall with injury in the past year?Yes  Prior Activity Level Community (5-7x/wk): retired Armed forces training and education officer, still drove PTA, Was independent PTA  without use of AD.   Prior Functional Level Prior Function Level of Independence: Independent Comments: per chart pt has not been limited physically  Self Care: Did the patient need help bathing, dressing, using the toilet or eating?  Independent  Indoor Mobility: Did the patient need assistance with walking from room to room (with or without device)? Independent  Stairs: Did the patient need assistance with internal or external stairs (with or without device)? Independent  Functional Cognition: Did the patient need help planning regular tasks such as shopping or remembering to take medications? Independent  Home Assistive Devices / Equipment Home Equipment: None  Prior Device Use: Indicate devices/aids used by the patient prior to current illness, exacerbation or injury? None of the above  Current Functional Level Cognition  Arousal/Alertness: Lethargic Overall Cognitive Status: No family/caregiver present to determine baseline cognitive functioning Difficult to assess due to: Impaired communication(Difficult to understand at times) Current Attention Level: Sustained Orientation Level: Oriented to person,  Oriented to place, Disoriented to time, Oriented to situation Following Commands: Follows one step commands with increased time Safety/Judgement: Decreased awareness of safety, Decreased awareness of deficits General Comments: better attention to task; comments mostly on topic; making needs known (drink, need to urinate) Attention: Focused, Sustained Focused Attention: Impaired Focused Attention Impairment: Verbal basic Sustained Attention: Impaired Sustained Attention Impairment: Verbal basic, Verbal  complex Memory: Impaired Memory Impairment: Retrieval deficit, Decreased recall of new information    Extremity Assessment (includes Sensation/Coordination)  Upper Extremity Assessment: LUE deficits/detail LUE Deficits / Details: sling utilized during transfer for safety LUE Sensation: decreased proprioception, decreased light touch LUE Coordination: decreased fine motor, decreased gross motor  Lower Extremity Assessment: Defer to PT evaluation LLE Deficits / Details: weakness and struggle to follow commands on L ankle esp LLE Coordination: decreased gross motor, decreased fine motor    ADLs  Overall ADL's : Needs assistance/impaired Eating/Feeding: Minimal assistance, Sitting Eating/Feeding Details (indicate cue type and reason): drinking from cup Grooming: Wash/dry face, Minimal assistance, Sitting Grooming Details (indicate cue type and reason): uses R hand, cues for task Upper Body Bathing: Sitting, Moderate assistance Lower Body Bathing: Sit to/from stand, Maximal assistance, +2 for physical assistance Upper Body Dressing : Moderate assistance, Sitting Lower Body Dressing: Maximal assistance, Bed level Lower Body Dressing Details (indicate cue type and reason): to don socks, Pt able to help with lifting legs Toilet Transfer: Maximal assistance, +2 for physical assistance, +2 for safety/equipment, Stand-pivot, Cueing for sequencing Toilet Transfer Details (indicate cue type and  reason): simulated OOB to chair Functional mobility during ADLs: Moderate assistance, +2 for safety/equipment, +2 for physical assistance(to recliner) General ADL Comments: pt unable to progress past stand pivot to chair with total (A) to block L LE. pt reliant on staff for transfer    Mobility  Overal bed mobility: Needs Assistance Bed Mobility: Rolling, Sidelying to Sit Rolling: Mod assist Sidelying to sit: +2 for physical assistance, Mod assist, HOB elevated Supine to sit: Total assist, +2 for safety/equipment, +2 for physical assistance, HOB elevated Sit to supine: Max assist Sit to sidelying: Max assist, +2 for physical assistance, +2 for safety/equipment General bed mobility comments: pt requires incr time for processing and assist for all mobility due to weakness; pt able to use RUE to assist with raising torso from side to sit; pt able to assist with scooting left hip out to EOB to get left foot on the floor    Transfers  Overall transfer level: Needs assistance Equipment used: Ambulation equipment used Transfer via Lift Equipment: Stedy Transfers: Sit to/from Stand, Risk manager Sit to Stand: Max assist, +2 physical assistance, +2 safety/equipment, Mod assist Stand pivot transfers: Max assist, +2 physical assistance, +2 safety/equipment General transfer comment: from EOB at lowest height; initial sit to stand moderate assist (pt with rt hand on bedrail); noted activation in LLE in weight-bearing; second sit to stand pt with hand  on PTs arm and required incr assist; +left lean in standing and during pivot to her rt    Ambulation / Gait / Stairs / Wheelchair Mobility  Ambulation/Gait Ambulation/Gait assistance: (unable.) Gait Distance (Feet): 1 Feet Assistive device: None Gait Pattern/deviations: Decreased stride length, Step-to pattern, Wide base of support General Gait Details: not yet appropriate Gait velocity: slow, controlled    Posture / Balance Dynamic Sitting  Balance Sitting balance - Comments: leans to her right with RUE for support and at times, leaning to her left against OT sitting on her left side Balance Overall balance assessment: Needs assistance Sitting-balance support: Single extremity supported, Feet supported Sitting balance-Leahy Scale: Poor Sitting balance - Comments: leans to her right with RUE for support and at times, leaning to her left against OT sitting on her left side Postural control: Posterior lean, Left lateral lean Standing balance support: Single extremity supported Standing balance-Leahy Scale: Poor Standing balance comment: LUE in sling for  support    Special needs/care consideration BiPAP/CPAP: no CPM: no Continuous Drip IV: no Dialysis: no        Days: no Life Vest: no Oxygen: no, on RA Special Bed: no Trach Size: no Wound Vac (area): no      Location: no Skin: ecchymosis to right groin, thigh                        Bowel mgmt:last BM 10/15/19 continent Bladder mgmt:incontinent, external urinary catheter  Diabetic mgmt: no Behavioral consideration : no Chemo/radiation : no     Previous Home Environment (from acute therapy documentation) Living Arrangements: Non-relatives/Friends Available Help at Discharge: Friend(s) Type of Home: House Home Layout: One level Home Access: Stairs to enter Entrance Stairs-Rails: None Entrance Stairs-Number of Steps: curb Bathroom Shower/Tub: Multimedia programmer: Standard  Discharge Living Setting Plans for Discharge Living Setting: Patient's home, will have support from family and (son's sister in law [place]) Type of Home at Discharge: House Discharge Home Layout: One level Discharge Home Access: Other (comment)(1 curb to navigate; no stairs) Discharge Bathroom Shower/Tub: Walk-in shower Discharge Bathroom Toilet: Standard Discharge Bathroom Accessibility: Yes How Accessible: Accessible via walker Does the patient have any problems obtaining your  medications?: No  Social/Family/Support Systems Patient Roles: Other (Comment)(has a grown son in [place], lives with roommate) Sport and exercise psychologist Information: son Nathaneil Canary): [phone] Anticipated Caregiver: Sunday Spillers (son's sister in law who will be coming from [place]) Anticipated Caregiver's Contact Information: see above for Nathaneil Canary Ability/Limitations of Caregiver: Mod A  Caregiver Availability: 24/7 Discharge Plan Discussed with Primary Caregiver: Yes(confirmed DC plan through [facility] who has been in contact with Sylvia-pt is aware of plan) Is Caregiver In Agreement with Plan?: Yes Does Caregiver/Family have Issues with Lodging/Transportation while Pt is in Rehab?: No   Goals/Additional Needs Patient/Family Goal for Rehab: PT/OT: Min/Mod A, SLP: Min A  Expected length of stay: 20-24 days  Cultural Considerations: NA Dietary Needs: DYS 1, nectar thick liquids  Equipment Needs: TBD Pt/Family Agrees to Admission and willing to participate: Yes Program Orientation Provided & Reviewed with Pt/Caregiver Including Roles  & Responsibilities: Yes(pt and her son )  Barriers to Discharge: Other (comments): Per son, they are flying Sunday Spillers up from [place]. She will be available for family training in approx 2 weeks. She speaks spanish so may need interpreter during family training. Per son, Sunday Spillers is able to do anticipated assist (Min/Mod A) and will be there 24/7 (plans to move in). Pt's family also plans to come up from [facility] intermittently to assist as needed.    Decrease burden of Care through IP rehab admission: NA   Possible need for SNF placement upon discharge:Not anticipated. Son has secured plan for his sister in law to come up from [place] to be the patient's 24/7 caregiver at [place]. They are aware the plan is to DC straight home from CIR in approx 2.5-3 weeks with 24/7 support up to Mod A. Pt and family in agreement with the above plan.    Patient Condition: This patient's medical and functional  status has changed since the consult dated 10/02/19 in which the Rehabilitation Physician documented that the patient was not appropriate for intensive rehabilitative care in an inpatient rehabilitation facility. Due to change in status and issues being addressed, patient's case has been discussed with Dr. Posey Pronto and patient now appropriate for inpatient rehabilitation. Pt has shown improved alertness and participation with therapies; family has confirmed a stable DC plan for post  acute rehab placement (home with family support). Will admit to inpatient rehab today.  Preadmission Screen Completed By:  Raechel Ache, OT, 10/16/2019 11:34 AM ______________________________________________________________________   Discussed status with Dr. Posey Pronto on 4/14/21at 11:34AM and received approval for admission today.  Admission Coordinator:  Raechel Ache, time 11:34AM/Date 10/16/19

## 2019-10-15 NOTE — Progress Notes (Signed)
Inpatient Rehabilitation-Admissions Coordinator   Spoke with pt's son yesterday evening. We had an extensive discussion regarding anticipated social support needed after an IP Rehab stay. He asked to call me at 4:30pm today with final determination on venue. IF pt's son is able to confirm a stable DC plan with adequate support, will pursue CIR when a bed becomes available.   Cheri Rous, OTR/L  Rehab Admissions Coordinator  [phone] 10/15/2019 8:17 AM

## 2019-10-16 ENCOUNTER — Inpatient Hospital Stay (HOSPITAL_COMMUNITY)
Admission: RE | Admit: 2019-10-16 | Discharge: 2019-11-14 | DRG: 056 | Disposition: A | Discharge status: Home Health | Payer: Medicare Other | Source: Intra-hospital | Attending: Physical Medicine & Rehabilitation | Admitting: Physical Medicine & Rehabilitation

## 2019-10-16 ENCOUNTER — Other Ambulatory Visit: Payer: Self-pay

## 2019-10-16 ENCOUNTER — Encounter (HOSPITAL_COMMUNITY): Payer: Self-pay | Admitting: Physical Medicine & Rehabilitation

## 2019-10-16 DIAGNOSIS — R350 Frequency of micturition: Secondary | ICD-10-CM

## 2019-10-16 DIAGNOSIS — Y838 Other surgical procedures as the cause of abnormal reaction of the patient, or of later complication, without mention of misadventure at the time of the procedure: Secondary | ICD-10-CM | POA: Diagnosis present

## 2019-10-16 DIAGNOSIS — I10 Essential (primary) hypertension: Secondary | ICD-10-CM | POA: Diagnosis not present

## 2019-10-16 DIAGNOSIS — I2510 Atherosclerotic heart disease of native coronary artery without angina pectoris: Secondary | ICD-10-CM | POA: Diagnosis present

## 2019-10-16 DIAGNOSIS — E46 Unspecified protein-calorie malnutrition: Secondary | ICD-10-CM

## 2019-10-16 DIAGNOSIS — R7303 Prediabetes: Secondary | ICD-10-CM

## 2019-10-16 DIAGNOSIS — I428 Other cardiomyopathies: Secondary | ICD-10-CM | POA: Diagnosis present

## 2019-10-16 DIAGNOSIS — E785 Hyperlipidemia, unspecified: Secondary | ICD-10-CM | POA: Diagnosis present

## 2019-10-16 DIAGNOSIS — I69392 Facial weakness following cerebral infarction: Secondary | ICD-10-CM | POA: Diagnosis not present

## 2019-10-16 DIAGNOSIS — I69391 Dysphagia following cerebral infarction: Secondary | ICD-10-CM

## 2019-10-16 DIAGNOSIS — Z6828 Body mass index (BMI) 28.0-28.9, adult: Secondary | ICD-10-CM

## 2019-10-16 DIAGNOSIS — I5023 Acute on chronic systolic (congestive) heart failure: Secondary | ICD-10-CM | POA: Diagnosis present

## 2019-10-16 DIAGNOSIS — L7632 Postprocedural hematoma of skin and subcutaneous tissue following other procedure: Secondary | ICD-10-CM | POA: Diagnosis present

## 2019-10-16 DIAGNOSIS — E43 Unspecified severe protein-calorie malnutrition: Secondary | ICD-10-CM | POA: Diagnosis present

## 2019-10-16 DIAGNOSIS — N319 Neuromuscular dysfunction of bladder, unspecified: Secondary | ICD-10-CM | POA: Diagnosis present

## 2019-10-16 DIAGNOSIS — I63411 Cerebral infarction due to embolism of right middle cerebral artery: Secondary | ICD-10-CM

## 2019-10-16 DIAGNOSIS — N390 Urinary tract infection, site not specified: Secondary | ICD-10-CM | POA: Diagnosis present

## 2019-10-16 DIAGNOSIS — Z8711 Personal history of peptic ulcer disease: Secondary | ICD-10-CM

## 2019-10-16 DIAGNOSIS — I214 Non-ST elevation (NSTEMI) myocardial infarction: Secondary | ICD-10-CM | POA: Diagnosis present

## 2019-10-16 DIAGNOSIS — D62 Acute posthemorrhagic anemia: Secondary | ICD-10-CM | POA: Diagnosis present

## 2019-10-16 DIAGNOSIS — M25512 Pain in left shoulder: Secondary | ICD-10-CM | POA: Diagnosis not present

## 2019-10-16 DIAGNOSIS — E876 Hypokalemia: Secondary | ICD-10-CM | POA: Diagnosis not present

## 2019-10-16 DIAGNOSIS — R410 Disorientation, unspecified: Secondary | ICD-10-CM | POA: Diagnosis not present

## 2019-10-16 DIAGNOSIS — Z79899 Other long term (current) drug therapy: Secondary | ICD-10-CM

## 2019-10-16 DIAGNOSIS — I110 Hypertensive heart disease with heart failure: Secondary | ICD-10-CM | POA: Diagnosis present

## 2019-10-16 DIAGNOSIS — I63511 Cerebral infarction due to unspecified occlusion or stenosis of right middle cerebral artery: Secondary | ICD-10-CM | POA: Diagnosis not present

## 2019-10-16 DIAGNOSIS — I69354 Hemiplegia and hemiparesis following cerebral infarction affecting left non-dominant side: Secondary | ICD-10-CM | POA: Diagnosis present

## 2019-10-16 DIAGNOSIS — Z807 Family history of other malignant neoplasms of lymphoid, hematopoietic and related tissues: Secondary | ICD-10-CM

## 2019-10-16 DIAGNOSIS — H16291 Other keratoconjunctivitis, right eye: Secondary | ICD-10-CM | POA: Diagnosis present

## 2019-10-16 DIAGNOSIS — H16209 Unspecified keratoconjunctivitis, unspecified eye: Secondary | ICD-10-CM

## 2019-10-16 DIAGNOSIS — G4700 Insomnia, unspecified: Secondary | ICD-10-CM | POA: Diagnosis not present

## 2019-10-16 DIAGNOSIS — I69322 Dysarthria following cerebral infarction: Secondary | ICD-10-CM | POA: Diagnosis not present

## 2019-10-16 DIAGNOSIS — G8929 Other chronic pain: Secondary | ICD-10-CM | POA: Diagnosis present

## 2019-10-16 DIAGNOSIS — Z955 Presence of coronary angioplasty implant and graft: Secondary | ICD-10-CM | POA: Diagnosis not present

## 2019-10-16 DIAGNOSIS — R7309 Other abnormal glucose: Secondary | ICD-10-CM

## 2019-10-16 DIAGNOSIS — I749 Embolism and thrombosis of unspecified artery: Secondary | ICD-10-CM

## 2019-10-16 DIAGNOSIS — I5021 Acute systolic (congestive) heart failure: Secondary | ICD-10-CM

## 2019-10-16 DIAGNOSIS — R1312 Dysphagia, oropharyngeal phase: Secondary | ICD-10-CM | POA: Diagnosis present

## 2019-10-16 DIAGNOSIS — Z79890 Hormone replacement therapy (postmenopausal): Secondary | ICD-10-CM

## 2019-10-16 DIAGNOSIS — R443 Hallucinations, unspecified: Secondary | ICD-10-CM | POA: Diagnosis not present

## 2019-10-16 DIAGNOSIS — Z823 Family history of stroke: Secondary | ICD-10-CM

## 2019-10-16 DIAGNOSIS — Z9049 Acquired absence of other specified parts of digestive tract: Secondary | ICD-10-CM

## 2019-10-16 DIAGNOSIS — I472 Ventricular tachycardia: Secondary | ICD-10-CM | POA: Diagnosis present

## 2019-10-16 DIAGNOSIS — R0989 Other specified symptoms and signs involving the circulatory and respiratory systems: Secondary | ICD-10-CM | POA: Diagnosis present

## 2019-10-16 DIAGNOSIS — Z9181 History of falling: Secondary | ICD-10-CM

## 2019-10-16 DIAGNOSIS — E8809 Other disorders of plasma-protein metabolism, not elsewhere classified: Secondary | ICD-10-CM | POA: Diagnosis present

## 2019-10-16 DIAGNOSIS — M19012 Primary osteoarthritis, left shoulder: Secondary | ICD-10-CM | POA: Diagnosis present

## 2019-10-16 DIAGNOSIS — R52 Pain, unspecified: Secondary | ICD-10-CM

## 2019-10-16 LAB — MAGNESIUM: Magnesium: 1.8 mg/dL (ref 1.7–2.4)

## 2019-10-16 LAB — BASIC METABOLIC PANEL
Anion gap: 12 (ref 5–15)
BUN: 8 mg/dL (ref 8–23)
CO2: 22 mmol/L (ref 22–32)
Calcium: 9.4 mg/dL (ref 8.9–10.3)
Chloride: 104 mmol/L (ref 98–111)
Creatinine, Ser: 0.79 mg/dL (ref 0.44–1.00)
GFR calc Af Amer: >60 mL/min (ref >60)
GFR calc non Af Amer: >60 mL/min (ref >60)
Glucose, Bld: 122 mg/dL — ABNORMAL HIGH (ref 70–99)
Potassium: 3.8 mmol/L (ref 3.5–5.1)
Sodium: 138 mmol/L (ref 135–145)

## 2019-10-16 LAB — GLUCOSE, CAPILLARY
Glucose-Capillary: 112 mg/dL — ABNORMAL HIGH (ref 70–99)
Glucose-Capillary: 153 mg/dL — ABNORMAL HIGH (ref 70–99)
Glucose-Capillary: 97 mg/dL (ref 70–99)

## 2019-10-16 MED ORDER — ERYTHROMYCIN 5 MG/GM OP OINT
TOPICAL_OINTMENT | Freq: Four times a day (QID) | OPHTHALMIC | Status: AC
  Administered 2019-10-19: 1 via OPHTHALMIC
  Filled 2019-10-16: qty 3.5

## 2019-10-16 MED ORDER — BOOST / RESOURCE BREEZE PO LIQD CUSTOM
1.0000 | Freq: Two times a day (BID) | ORAL | Status: DC
  Administered 2019-10-17 – 2019-11-10 (×37): 1 via ORAL

## 2019-10-16 MED ORDER — AMOXICILLIN 500 MG PO CAPS: 500.0000 mg | ORAL_CAPSULE | Freq: Three times a day (TID) | ORAL | 0 refills | Status: DC

## 2019-10-16 MED ORDER — CARVEDILOL 3.125 MG PO TABS: 3.1250 mg | ORAL_TABLET | Freq: Two times a day (BID) | ORAL | Status: DC

## 2019-10-16 MED ORDER — ACETAMINOPHEN 650 MG RE SUPP: 650.0000 mg | RECTAL | Status: DC | PRN

## 2019-10-16 MED ORDER — AMOXICILLIN 250 MG PO CAPS
500.0000 mg | ORAL_CAPSULE | Freq: Three times a day (TID) | ORAL | Status: AC
  Administered 2019-10-16 – 2019-10-17 (×3): 500 mg via ORAL
  Filled 2019-10-16 (×3): qty 2

## 2019-10-16 MED ORDER — CARVEDILOL 3.125 MG PO TABS
3.1250 mg | ORAL_TABLET | Freq: Two times a day (BID) | ORAL | Status: DC
  Administered 2019-10-16 – 2019-10-20 (×8): 3.125 mg via ORAL
  Filled 2019-10-16 (×8): qty 1

## 2019-10-16 MED ORDER — ATORVASTATIN CALCIUM 80 MG PO TABS: 80.0000 mg | ORAL_TABLET | Freq: Every day | ORAL | Status: DC

## 2019-10-16 MED ORDER — NITROGLYCERIN 0.4 MG SL SUBL: 0.4000 mg | SUBLINGUAL_TABLET | SUBLINGUAL | Status: DC | PRN

## 2019-10-16 MED ORDER — ERYTHROMYCIN 5 MG/GM OP OINT: TOPICAL_OINTMENT | Freq: Four times a day (QID) | OPHTHALMIC | 0 refills | Status: DC

## 2019-10-16 MED ORDER — INSULIN ASPART 100 UNIT/ML ~~LOC~~ SOLN
0.0000 [IU] | Freq: Three times a day (TID) | SUBCUTANEOUS | Status: DC
  Administered 2019-10-17: 13:00:00 3 [IU] via SUBCUTANEOUS
  Administered 2019-10-17 – 2019-10-19 (×3): 2 [IU] via SUBCUTANEOUS
  Administered 2019-10-19 – 2019-10-21 (×2): 3 [IU] via SUBCUTANEOUS
  Administered 2019-10-22: 12:00:00 2 [IU] via SUBCUTANEOUS
  Administered 2019-10-23: 12:00:00 3 [IU] via SUBCUTANEOUS
  Administered 2019-10-24 (×2): 2 [IU] via SUBCUTANEOUS
  Administered 2019-10-25 – 2019-10-26 (×2): 3 [IU] via SUBCUTANEOUS
  Administered 2019-10-27 – 2019-10-29 (×6): 2 [IU] via SUBCUTANEOUS
  Administered 2019-10-29: 3 [IU] via SUBCUTANEOUS
  Administered 2019-10-29 – 2019-10-30 (×2): 2 [IU] via SUBCUTANEOUS
  Administered 2019-10-30 – 2019-10-31 (×2): 3 [IU] via SUBCUTANEOUS
  Administered 2019-10-31 – 2019-11-01 (×5): 2 [IU] via SUBCUTANEOUS
  Administered 2019-11-02: 5 [IU] via SUBCUTANEOUS
  Administered 2019-11-02 (×2): 2 [IU] via SUBCUTANEOUS
  Administered 2019-11-03 (×3): 3 [IU] via SUBCUTANEOUS
  Administered 2019-11-04 (×2): 2 [IU] via SUBCUTANEOUS
  Administered 2019-11-04: 5 [IU] via SUBCUTANEOUS
  Administered 2019-11-05: 2 [IU] via SUBCUTANEOUS
  Administered 2019-11-05 (×2): 3 [IU] via SUBCUTANEOUS
  Administered 2019-11-06: 5 [IU] via SUBCUTANEOUS
  Administered 2019-11-06 – 2019-11-07 (×3): 3 [IU] via SUBCUTANEOUS
  Administered 2019-11-07 – 2019-11-08 (×4): 2 [IU] via SUBCUTANEOUS
  Administered 2019-11-09: 3 [IU] via SUBCUTANEOUS
  Administered 2019-11-09 – 2019-11-11 (×6): 2 [IU] via SUBCUTANEOUS
  Administered 2019-11-11 – 2019-11-12 (×2): 3 [IU] via SUBCUTANEOUS
  Administered 2019-11-12 (×2): 2 [IU] via SUBCUTANEOUS
  Administered 2019-11-13: 3 [IU] via SUBCUTANEOUS
  Administered 2019-11-13 – 2019-11-14 (×3): 2 [IU] via SUBCUTANEOUS

## 2019-10-16 MED ORDER — AMANTADINE HCL 100 MG PO CAPS
100.0000 mg | ORAL_CAPSULE | Freq: Two times a day (BID) | ORAL | Status: DC
  Administered 2019-10-16 – 2019-10-25 (×19): 100 mg via ORAL
  Filled 2019-10-16 (×20): qty 1

## 2019-10-16 MED ORDER — PANTOPRAZOLE SODIUM 40 MG PO TBEC
40.0000 mg | DELAYED_RELEASE_TABLET | Freq: Two times a day (BID) | ORAL | Status: DC
  Administered 2019-10-16 – 2019-11-14 (×57): 40 mg via ORAL
  Filled 2019-10-16 (×58): qty 1

## 2019-10-16 MED ORDER — MELATONIN 3 MG PO TABS
3.0000 mg | ORAL_TABLET | Freq: Every day | ORAL | Status: DC
  Administered 2019-10-16 – 2019-11-06 (×22): 3 mg via ORAL
  Filled 2019-10-16 (×22): qty 1

## 2019-10-16 MED ORDER — ATORVASTATIN CALCIUM 80 MG PO TABS
80.0000 mg | ORAL_TABLET | Freq: Every day | ORAL | Status: DC
  Administered 2019-10-16 – 2019-11-13 (×29): 80 mg via ORAL
  Filled 2019-10-16 (×30): qty 1

## 2019-10-16 MED ORDER — POLYVINYL ALCOHOL 1.4 % OP SOLN
1.0000 [drp] | Freq: Three times a day (TID) | OPHTHALMIC | Status: DC | PRN
  Administered 2019-10-17 – 2019-10-27 (×4): 1 [drp] via OPHTHALMIC
  Filled 2019-10-16: qty 15

## 2019-10-16 MED ORDER — MAGNESIUM SULFATE 2 GM/50ML IV SOLN
2.0000 g | Freq: Once | INTRAVENOUS | Status: AC
  Administered 2019-10-16: 2 g via INTRAVENOUS
  Filled 2019-10-16: qty 50

## 2019-10-16 MED ORDER — ACETAMINOPHEN 325 MG PO TABS
650.0000 mg | ORAL_TABLET | ORAL | Status: DC | PRN
  Administered 2019-10-17 – 2019-11-13 (×35): 650 mg via ORAL
  Filled 2019-10-16 (×40): qty 2

## 2019-10-16 MED ORDER — CLOPIDOGREL BISULFATE 75 MG PO TABS: 75.0000 mg | ORAL_TABLET | Freq: Every day | ORAL | Status: DC

## 2019-10-16 MED ORDER — POLYETHYLENE GLYCOL 3350 17 G PO PACK
17.0000 g | PACK | Freq: Every day | ORAL | Status: DC | PRN
  Administered 2019-10-17 – 2019-10-28 (×3): 17 g via ORAL
  Filled 2019-10-16 (×4): qty 1

## 2019-10-16 MED ORDER — DOCUSATE SODIUM 50 MG/5ML PO LIQD
100.0000 mg | Freq: Two times a day (BID) | ORAL | Status: DC
  Administered 2019-10-16 – 2019-10-19 (×6): 100 mg via ORAL
  Filled 2019-10-16 (×7): qty 10

## 2019-10-16 MED ORDER — CLOPIDOGREL BISULFATE 75 MG PO TABS
75.0000 mg | ORAL_TABLET | Freq: Every day | ORAL | Status: DC
  Administered 2019-10-17 – 2019-11-14 (×28): 75 mg via ORAL
  Filled 2019-10-16 (×29): qty 1

## 2019-10-16 MED ORDER — PANTOPRAZOLE SODIUM 40 MG PO TBEC: 40.0000 mg | DELAYED_RELEASE_TABLET | Freq: Two times a day (BID) | ORAL | 11 refills | Status: DC

## 2019-10-16 MED ORDER — SENNOSIDES-DOCUSATE SODIUM 8.6-50 MG PO TABS
2.0000 | ORAL_TABLET | Freq: Every evening | ORAL | Status: DC | PRN
  Administered 2019-10-19 – 2019-11-03 (×4): 2 via ORAL
  Filled 2019-10-16 (×4): qty 2

## 2019-10-16 NOTE — Progress Notes (Signed)
Inpatient Rehabilitation Medication Review by a Pharmacist  A complete drug regimen review was completed for this patient to identify any potential clinically significant medication issues.  Clinically significant medication issues were identified:  No  Pharmacist comments: No issues identified   Time spent performing this drug regimen review (minutes):  10 minutes  Gerrit Halls, PharmD PGY1 Pharmacy Resident  10/16/2019 5:33 PM

## 2019-10-16 NOTE — Progress Notes (Signed)
  Speech Language Pathology Treatment: Dysphagia  Patient Details Name: Brittney Kennedy MRN: [id number] DOB: 01-02-1951 Today's Date: 10/16/2019 Time: 7026-3785 SLP Time Calculation (min) (ACUTE ONLY): 10 min  Assessment / Plan / Recommendation Clinical Impression  Pt tolerating current diet well - dysphagia 1, nectar thick liquids - with intermittent verbal/tactile cues needed to attend to left side for spillage and residue. There are no overt s/s of aspiration with this diet. Pt continues to drink water 30 minutes after meals and after oral care - she demonstrates intermittent coughing/throat-clearing associated with thin liquids.  Plan is for D/C today to CIR; will benefit from repeat MBS soon (last test was 4/3) and ongoing tx for dysarthria. Pt agrees with plan.   HPI HPI: 69 y/o female with hx of CAD, cholecystectomy, presented to ED with nausea/vomiting, epigastric pain for 1 week intermittently.  Admitted for NSTEMI, cath 3/26 revealed severe multivessel CAD and transferred to [facility].  Fall 3/27 with R groin hematoma.  Found AMS 3/28 due to pain meds.  Rapid response 3/29 due to AMS and L facial droop/weakness. CT revealed right basal ganglia infarct.  3/29 pm with worsening deficits; STAT CT head with right parietal infarct.       SLP Plan  Continue with current plan of care       Recommendations  Diet recommendations: Dysphagia 1 (puree);Nectar-thick liquid Liquids provided via: Cup Medication Administration: Crushed with puree Supervision: Staff to assist with self feeding Compensations: Minimize environmental distractions;Slow rate;Small sips/bites Postural Changes and/or Swallow Maneuvers: Seated upright 90 degrees;Upright 30-60 min after meal                Oral Care Recommendations: Oral care BID;Oral care prior to ice chip/H20 Follow up Recommendations: Inpatient Rehab SLP Visit Diagnosis: Dysphagia, oropharyngeal phase (R13.12) Plan: Continue with current plan of  care       GO                Brittney Kennedy 10/16/2019, 11:45 AM  Brittney Kennedy Frederic, MA CCC/SLP Acute Rehabilitation Services Office number [phone]

## 2019-10-16 NOTE — Progress Notes (Signed)
Raechel Ache, [place]  Rehab Admission Coordinator  Physical Medicine and Rehabilitation  PMR Pre-admission     Signed  Date of Service:  10/15/2019 11:31 AM      Related encounter: Admission (Current) from 09/27/2019 in [facility]      Signed        PMR Admission Coordinator Pre-Admission Assessment   Patient: Brittney Kennedy is an 69 y.o., female MRN: [id number] DOB: 03-02-1951 Height: _0  (157.5 cm) Weight: 58.2 kg                                                                                                                                                  Insurance Information HMO:     PPO:      PCP:      IPA:      80/20: yes     OTHER:  PRIMARY: Medicare A and B      Policy#: [id number]      Subscriber: Patient CM Name:       Phone#:      Fax#:  Pre-Cert#:       Employer:  Benefits:  Phone #:      Name: verified eligibility online via [place] on 10/15/19 Eff. Date: Part A effective 06/03/17, Part B effective 01/02/19     Deduct: $1,484      Out of Pocket Max: NA      Life Max: NA CIR: Covered per Medicare guidelines once yearly deductible has been met      SNF: days 1-20, 100%, days 21-100, 80% Outpatient: 80%     Co-Pay: 20% Home Health: 100%      Co-Pay:  DME: 80%     Co-Pay: 20% Providers: Pt's choice SECONDARY: Aetna/A[employer]na senior supplemental insurance      Policy#: [id number]      Subscriber: patient CM Name:       Phone#:      Fax#:  Pre-Cert#:       Employer:  Benefits:  Phone #: none listed      Name:  Eff. Date:     Deduct:       Out of Pocket Max:       Life Max:  CIR:       SNF:  Outpatient:      Co-Pay:  Home Health:       Co-Pay:  DME:      Co-Pay:   Medicaid Application Date:       Case Manager:  Disability Application Date:       Case Worker:    The "Data Collection Information Summary" for patients in Inpatient Rehabilitation Facilities with attached "Privacy Act [place] Records" was provided and verbally reviewed  with: Patient and Family   Emergency Contact Information         Contact Information  Name Relation Home Work Mobile    [place] Son [phone]   [phone]    Assun[place] Curtis [phone]   [phone]       Current Medical History  Patient Admitting Diagnosis: Large Right MCA infarct with left hemiplegia, neglect, anarthria, dysphagia and cognitive deficits   History of Present Illness: Brittney Kennedy is a 69 year old female with unremarkable past medical history.  Per chart review patient independent prior to admission 1 level home.  She lives in the [place] area with a friend.  Family planning assistance as needed on discharge.  Presented 09/27/2019 with intermittent nausea and vomiting over the past week as well as epigastric substernal pain.  Admission chemistries potassium 3.0, troponin high-sensitivity 674, SARS coronavirus negative.  EKG question old septal infarct with lateral ST-T changes CT abdomen pelvis negative for acute intra-abdominal or pelvic pathology.  She was placed on intravenous heparin.  Cardiac catheterization revealed severe multivessel CAD with LV dysfunction ejection fraction 25% and patient was stented.  Acute systolic heart failure due to an ICM and follow-up management per cardiology services.  Patient did develop right groin hematoma after catheterization and currently maintained on Lovenox as well as low-dose aspirin.  Neurology services consulted 09/30/2019 for lethargy new left facial droop left upper extremity weakness.  Cranial CT scan showed right basal ganglia infarction as well as small remote appearing right pontine infarction.  CT angiogram of the neck moderate stenosis distal right M1 segment.  Moderate to severe stenosis inferior branch of right middle cerebral artery.  No large vessel occlusion.  Follow-up CT of the head 10/01/2019 showed acute infarct right parietal lobe age-indeterminate infarct right caudate.  EEG negative for seizure.   Plavix was added to patient's prophylaxis for CVA.  Gastroenterology services consulted 10/08/2019 for melena BUN markedly elevated 49-50, hemoglobin dipping to 8.7 down to 7.3, WBC 17,700.  EGD upper endoscopy completed 10/08/2019 per Dr. Havery Moros showed actively bleeding duodenal ulcer with visible vessel treated with epi and hemostasis clips x5.  Latest hemoglobin stable 8.4 and renal function improved BUN 9 creatinine 0.81.  GI had recommended PPI twice daily and outpatient follow-up.  Patient's aspirin is been discontinued and currently maintained on Plavix.  Hospital course UTI urine culture greater 100,000 Aerococcus viridans and patient completed 5-day course of amoxicillin.  Ophthalmology consulted 10/12/2019 Dr. Dennison Mascot for keratoconjunctivitis who recommended erythromycin ophthalmic 4 times daily x7 days with warm compresses.  Patient is currently on a dysphagia #1 nectar thick liquid diet. Therapy evaluations completed and patient is to be admitted for a comprehensive rehab program on 10/16/19.   Complete NIHSS TOTAL: 11 Glasgow Coma Scale Score: 14   Past Medical History      Past Medical History:  Diagnosis Date  . CAD (coronary artery disease)      a. 09/2019 NSTEMI/Cath: LM nl, LAD 90p, 63m diffuse mid-dist dzs, LCX 948mV groove/OM2, RI 90p, RCA dominant, nl, RPDA 9061mF 25%, glob HK.  . HMarland KitchenrEF (heart failure with reduced ejection fraction) ([facility]    a. 09/2019 LV gram: EF 25%, global HK.  . PMarland KitchenD (peptic ulcer disease)    . Seasonal allergies        Family History  family history includes Multiple myeloma in her father and sister; Stroke in her mother.   Prior Rehab/Hospitalizations:  Has the patient had prior rehab or hospitalizations prior to admission? No   Has the patient had major surgery during 100 days prior to admission? Yes   Current Medications  Current Facility-Administered Medications:  .  0.9 %  sodium chloride infusion, 250 mL, Intravenous, PRN, Lyda Jester M, PA-C, Stopped at 10/08/19 1620 .  acetaminophen (TYLENOL) tablet 650 mg, 650 mg, Oral, Q4H PRN, 650 mg at 10/12/19 1104 **OR** acetaminophen (TYLENOL) suppository 650 mg, 650 mg, Rectal, Q4H PRN, Bensimhon, Shaune Pascal, MD .  amantadine (SYMMETREL) capsule 100 mg, 100 mg, Oral, BID, Garvin Fila, MD, 100 mg at 10/16/19 0825 .  amoxicillin (AMOXIL) capsule 500 mg, 500 mg, Oral, Q8H, Hongalgi, Anand D, MD, 500 mg at 10/16/19 8119 .  atorvastatin (LIPITOR) tablet 80 mg, 80 mg, Oral, q1800, Bensimhon, Shaune Pascal, MD, 80 mg at 10/15/19 1713 .  carvedilol (COREG) tablet 3.125 mg, 3.125 mg, Oral, BID WC, Hongalgi, Anand D, MD, 3.125 mg at 10/16/19 0825 .  chlorhexidine (PERIDEX) 0.12 % solution 15 mL, 15 mL, Mouth Rinse, BID, Lyda Jester M, PA-C, 15 mL at 10/16/19 0826 .  clopidogrel (PLAVIX) tablet 75 mg, 75 mg, Oral, Q breakfast, Bensimhon, Shaune Pascal, MD, 75 mg at 10/16/19 0825 .  docusate (COLACE) 50 MG/5ML liquid 100 mg, 100 mg, Oral, BID, Hongalgi, Anand D, MD, 100 mg at 10/16/19 0826 .  erythromycin ophthalmic ointment, , Right Eye, Q6H, Modena Jansky, MD, Given at 10/16/19 [id number] .  feeding supplement (BOOST / RESOURCE BREEZE) liquid 1 Container, 1 Container, Oral, BID BM, Bensimhon, Shaune Pascal, MD, 1 Container at 10/16/19 [id number] .  haloperidol lactate (HALDOL) injection 1 mg, 1 mg, Intravenous, Q6H PRN, Opyd, Timothy S, MD .  insulin aspart (novoLOG) injection 0-15 Units, 0-15 Units, Subcutaneous, TID WC, Simmons, Brittainy M, PA-C, 2 Units at 10/15/19 1712 .  MEDLINE mouth rinse, 15 mL, Mouth Rinse, q12n4p, Lyda Jester M, PA-C, 15 mL at 10/15/19 1713 .  melatonin tablet 3 mg, 3 mg, Oral, QHS, Hongalgi, Anand D, MD, 3 mg at 10/15/19 2258 .  nitroGLYCERIN (NITROSTAT) SL tablet 0.4 mg, 0.4 mg, Sublingual, Q5 min PRN, Lyda Jester M, PA-C, 0.4 mg at 09/30/19 1216 .  ondansetron (ZOFRAN) injection 4 mg, 4 mg, Intravenous, Q6H PRN, Simmons, Brittainy M, PA-C .  pantoprazole  (PROTONIX) injection 40 mg, 40 mg, Intravenous, Q12H, Hongalgi, Anand D, MD, 40 mg at 10/16/19 0826 .  polyethylene glycol (MIRALAX / GLYCOLAX) packet 17 g, 17 g, Oral, Daily PRN, Amin, Ankit Chirag, MD .  polyvinyl alcohol (LIQUIFILM TEARS) 1.4 % ophthalmic solution 1 drop, 1 drop, Both Eyes, TID PRN, Modena Jansky, MD, 1 drop at 10/15/19 1053 .  Resource ThickenUp Clear, , Oral, PRN, Bensimhon, Shaune Pascal, MD .  senna-docusate (Senokot-S) tablet 2 tablet, 2 tablet, Oral, QHS PRN, Amin, Ankit Chirag, MD .  sodium chloride flush (NS) 0.9 % injection 3 mL, 3 mL, Intravenous, Q12H, Simmons, Brittainy M, PA-C, 3 mL at 10/16/19 0827   Patients Current Diet:     Diet Order                      DIET - DYS 1 Room service appropriate? Yes; Fluid consistency: Nectar Thick  Diet effective now                   Precautions / Restrictions Precautions Precautions: Fall Precaution Comments: aspiration precautions (dysphagia 1 with nectar), HOB >30 degrees Other Brace: L sling to prevent further sublaxation Restrictions Weight Bearing Restrictions: No    Has the patient had 2 or more falls or a fall with injury in the past year?Yes   Prior  Activity Level Community (5-7x/wk): retired Armed forces training and education officer, still drove PTA, Was independent PTA  without use of AD.    Prior Functional Level Prior Function Level of Independence: Independent Comments: per chart pt has not been limited physically   Self Care: Did the patient need help bathing, dressing, using the toilet or eating?  Independent   Indoor Mobility: Did the patient need assistance with walking from room to room (with or without device)? Independent   Stairs: Did the patient need assistance with internal or external stairs (with or without device)? Independent   Functional Cognition: Did the patient need help planning regular tasks such as shopping or remembering to take medications? Independent   Home Assistive Devices / Equipment Home  Equipment: None   Prior Device Use: Indicate devices/aids used by the patient prior to current illness, exacerbation or injury? None of the above   Current Functional Level Cognition   Arousal/Alertness: Lethargic Overall Cognitive Status: No family/caregiver present to determine baseline cognitive functioning Difficult to assess due to: Impaired communication(Difficult to understand at times) Current Attention Level: Sustained Orientation Level: Oriented to person, Oriented to place, Disoriented to time, Oriented to situation Following Commands: Follows one step commands with increased time Safety/Judgement: Decreased awareness of safety, Decreased awareness of deficits General Comments: better attention to task; comments mostly on topic; making needs known (drink, need to urinate) Attention: Focused, Sustained Focused Attention: Impaired Focused Attention Impairment: Verbal basic Sustained Attention: Impaired Sustained Attention Impairment: Verbal basic, Verbal complex Memory: Impaired Memory Impairment: Retrieval deficit, Decreased recall of new information    Extremity Assessment (includes Sensation/Coordination)   Upper Extremity Assessment: LUE deficits/detail LUE Deficits / Details: sling utilized during transfer for safety LUE Sensation: decreased proprioception, decreased light touch LUE Coordination: decreased fine motor, decreased gross motor  Lower Extremity Assessment: Defer to PT evaluation LLE Deficits / Details: weakness and struggle to follow commands on L ankle esp LLE Coordination: decreased gross motor, decreased fine motor     ADLs   Overall ADL's : Needs assistance/impaired Eating/Feeding: Minimal assistance, Sitting Eating/Feeding Details (indicate cue type and reason): drinking from cup Grooming: Wash/dry face, Minimal assistance, Sitting Grooming Details (indicate cue type and reason): uses R hand, cues for task Upper Body Bathing: Sitting, Moderate  assistance Lower Body Bathing: Sit to/from stand, Maximal assistance, +2 for physical assistance Upper Body Dressing : Moderate assistance, Sitting Lower Body Dressing: Maximal assistance, Bed level Lower Body Dressing Details (indicate cue type and reason): to don socks, Pt able to help with lifting legs Toilet Transfer: Maximal assistance, +2 for physical assistance, +2 for safety/equipment, Stand-pivot, Cueing for sequencing Toilet Transfer Details (indicate cue type and reason): simulated OOB to chair Functional mobility during ADLs: Moderate assistance, +2 for safety/equipment, +2 for physical assistance(to recliner) General ADL Comments: pt unable to progress past stand pivot to chair with total (A) to block L LE. pt reliant on staff for transfer     Mobility   Overal bed mobility: Needs Assistance Bed Mobility: Rolling, Sidelying to Sit Rolling: Mod assist Sidelying to sit: +2 for physical assistance, Mod assist, HOB elevated Supine to sit: Total assist, +2 for safety/equipment, +2 for physical assistance, HOB elevated Sit to supine: Max assist Sit to sidelying: Max assist, +2 for physical assistance, +2 for safety/equipment General bed mobility comments: pt requires incr time for processing and assist for all mobility due to weakness; pt able to use RUE to assist with raising torso from side to sit; pt able to assist with scooting left hip  out to EOB to get left foot on the floor     Transfers   Overall transfer level: Needs assistance Equipment used: Ambulation equipment used Transfer via Lift Equipment: Stedy Transfers: Sit to/from Stand, Risk manager Sit to Stand: Max assist, +2 physical assistance, +2 safety/equipment, Mod assist Stand pivot transfers: Max assist, +2 physical assistance, +2 safety/equipment General transfer comment: from EOB at lowest height; initial sit to stand moderate assist (pt with rt hand on bedrail); noted activation in LLE in weight-bearing;  second sit to stand pt with hand  on PTs arm and required incr assist; +left lean in standing and during pivot to her rt     Ambulation / Gait / Stairs / Wheelchair Mobility   Ambulation/Gait Ambulation/Gait assistance: (unable.) Gait Distance (Feet): 1 Feet Assistive device: None Gait Pattern/deviations: Decreased stride length, Step-to pattern, Wide base of support General Gait Details: not yet appropriate Gait velocity: slow, controlled     Posture / Balance Dynamic Sitting Balance Sitting balance - Comments: leans to her right with RUE for support and at times, leaning to her left against OT sitting on her left side Balance Overall balance assessment: Needs assistance Sitting-balance support: Single extremity supported, Feet supported Sitting balance-Leahy Scale: Poor Sitting balance - Comments: leans to her right with RUE for support and at times, leaning to her left against OT sitting on her left side Postural control: Posterior lean, Left lateral lean Standing balance support: Single extremity supported Standing balance-Leahy Scale: Poor Standing balance comment: LUE in sling for support     Special needs/care consideration BiPAP/CPAP: no CPM: no Continuous Drip IV: no Dialysis: no        Days: no Life Vest: no Oxygen: no, on RA Special Bed: no Trach Size: no Wound Vac (area): no      Location: no Skin: ecchymosis to right groin, thigh                        Bowel mgmt:last BM 10/15/19 continent Bladder mgmt:incontinent, external urinary catheter  Diabetic mgmt: no Behavioral consideration : no Chemo/radiation : no        Previous Home Environment (from acute therapy documentation) Living Arrangements: Non-relatives/Friends Available Help at Discharge: Friend(s) Type of Home: House Home Layout: One level Home Access: Stairs to enter Entrance Stairs-Rails: None Entrance Stairs-Number of Steps: curb Bathroom Shower/Tub: Multimedia programmer: Standard     Discharge Living Setting Plans for Discharge Living Setting: Patient's home, will have support from family and (son's sister in law [place]) Type of Home at Discharge: House Discharge Home Layout: One level Discharge Home Access: Other (comment)(1 curb to navigate; no stairs) Discharge Bathroom Shower/Tub: Walk-in shower Discharge Bathroom Toilet: Standard Discharge Bathroom Accessibility: Yes How Accessible: Accessible via walker Does the patient have any problems obtaining your medications?: No   Social/Family/Support Systems Patient Roles: Other (Comment)(has a grown son in [place], lives with roommate) Sport and exercise psychologist Information: son Nathaneil Canary): [phone] Anticipated Caregiver: Sunday Spillers (son's sister in law who will be coming from [place]) Anticipated Caregiver's Contact Information: see above for Nathaneil Canary Ability/Limitations of Caregiver: Mod A  Caregiver Availability: 24/7 Discharge Plan Discussed with Primary Caregiver: Yes(confirmed DC plan through [facility] who has been in contact with Sylvia-pt is aware of plan) Is Caregiver In Agreement with Plan?: Yes Does Caregiver/Family have Issues with Lodging/Transportation while Pt is in Rehab?: No     Goals/Additional Needs Patient/Family Goal for Rehab: PT/OT: Min/Mod A, SLP: Min A  Expected length of  stay: 20-24 days  Cultural Considerations: NA Dietary Needs: DYS 1, nectar thick liquids  Equipment Needs: TBD Pt/Family Agrees to Admission and willing to participate: Yes Program Orientation Provided & Reviewed with Pt/Caregiver Including Roles  & Responsibilities: Yes(pt and her son )  Barriers to Discharge: Other (comments): Per son, they are flying Sunday Spillers up from [place]. She will be available for family training in approx 2 weeks. She speaks spanish so may need interpreter during family training. Per son, Sunday Spillers is able to do anticipated assist (Min/Mod A) and will be there 24/7 (plans to move in). Pt's family also plans to come up from [facility]  intermittently to assist as needed.      Decrease burden of Care through IP rehab admission: NA     Possible need for SNF placement upon discharge:Not anticipated. Son has secured plan for his sister in law to come up from [place] to be the patient's 24/7 caregiver at [facility]. They are aware the plan is to DC straight home from CIR in approx 2.5-3 weeks with 24/7 support up to Mod A. Pt and family in agreement with the above plan.      Patient Condition: This patient's medical and functional status has changed since the consult dated 10/02/19 in which the Rehabilitation Physician documented that the patient was not appropriate for intensive rehabilitative care in an inpatient rehabilitation facility. Due to change in status and issues being addressed, patient's case has been discussed with Dr. Posey Pronto and patient now appropriate for inpatient rehabilitation. Pt has shown improved alertness and participation with therapies; family has confirmed a stable DC plan for post acute rehab placement (home with family support). Will admit to inpatient rehab today.   Preadmission Screen Completed By:  Raechel Ache, OT, 10/16/2019 11:34 AM ______________________________________________________________________   Discussed status with Dr. Posey Pronto on 4/14/21at 11:34AM and received approval for admission today.   Admission Coordinator:  Raechel Ache, time 11:34AM/Date 10/16/19         Cosigned by: Jamse Arn, MD at 10/16/2019 11:53 AM  Revision History

## 2019-10-16 NOTE — H&P (Signed)
Physical Medicine and Rehabilitation Admission H&P     HPI: Brittney Kennedy is a 69 year old right-handed female with unremarkable past medical history.  History taken from chart review due to distraction. Patient was independent prior to admission 1 level home.  She lives in the [place] area with a friend.  Family planning assistance as needed on discharge.  She presented on 09/27/2019 with intermittent nausea/vomitting x1 week as well as epigastric substernal pain.  Admission chemistries potassium 3.0, troponin high-sensitivity 674, SARS coronavirus negative.  EKG question old septal infarct with lateral ST-T changes CT abdomen pelvis negative for acute intra-abdominal or pelvic pathology.  She was placed on IV heparin.  Cardiac catheterization revealed severe multivessel CAD with LV dysfunction, ejection fraction of 25%. Patient was stented. Acute systolic heart failure due to an ICM and follow-up management per cardiology services.  Patient did develop right groin hematoma after catheterization and currently maintained on Lovenox as well as low-dose aspirin.  Neurology consulted on 09/30/19 for lethargy, left facial droop, left upper extremity weakness.  Cranial CT showed right basal ganglia infarction as well as remote appearing right pontine infarction.  CT angiogram of the neck moderate stenosis distal right M1 segment.  Moderate to severe stenosis inferior branch of right middle cerebral artery.  No large vessel occlusion.  Follow-up CT of the head 10/01/2019 showed acute infarct right parietal lobe age-indeterminate infarct right caudate.  EEG negative for seizures.  Plavix was added to patient's prophylaxis for CVA.  Gastroenterology services consulted 10/08/2019 for melena BUN markedly elevated 49-50, hemoglobin dipping to 8.7 down to 7.3, WBC 17,700.  EGD upper endoscopy completed 10/08/2019 per Dr. Havery Moros showed actively bleeding duodenal ulcer with visible vessel treated with epi and hemostasis  clips x5.  Latest hemoglobin stable 8.4 on 4/12 and renal function improved BUN 9 creatinine 0.81.  GI had recommended PPI twice daily and outpatient follow-up.  Patient's aspirin is been discontinued and currently maintained on Plavix.  Hospital course further complicated by UTI, urine culture greater 100,000 Aerococcus viridans and patient completed 5-day course of amoxicillin.  Ophthalmology consulted 10/12/2019 Dr. Dennison Mascot for keratoconjunctivitis who recommended erythromycin ophthalmic 4 times daily x7 days with warm compresses.  Patient is currently on a dysphagia #1 nectar thick liquid diet.  Therapy evaluations completed and patient was admitted for a comprehensive rehab program. Please see preadmission assessment from earlier today as well.   Review of Systems  Constitutional: Positive for malaise/fatigue. Negative for chills and fever.  HENT: Negative for hearing loss.   Eyes: Negative for blurred vision and double vision.  Respiratory: Negative for cough and shortness of breath.   Cardiovascular: Negative for palpitations and leg swelling.  Gastrointestinal: Positive for constipation, nausea and vomiting.  Genitourinary: Negative for dysuria, flank pain and hematuria.  Musculoskeletal: Positive for myalgias.  Skin: Negative for rash.  Neurological: Positive for focal weakness and weakness. Negative for sensory change and seizures.   Past Medical History:  Diagnosis Date  . CAD (coronary artery disease)    a. 09/2019 NSTEMI/Cath: LM nl, LAD 90p, 62m diffuse mid-dist dzs, LCX 993mV groove/OM2, RI 90p, RCA dominant, nl, RPDA 9081mF 25%, glob HK.  . HMarland KitchenrEF (heart failure with reduced ejection fraction) (HCCTurin  a. 09/2019 LV gram: EF 25%, global HK.  . PMarland KitchenD (peptic ulcer disease)   . Seasonal allergies    Past Surgical History:  Procedure Laterality Date  . CHOLECYSTECTOMY    . CORONARY STENT INTERVENTION N/A 09/30/2019  Procedure: CORONARY STENT INTERVENTION;  Surgeon: Martinique,  Peter M, MD;  Location: [facility];  Service: Cardiovascular;  Laterality: N/A;  . ESOPHAGOGASTRODUODENOSCOPY (EGD) WITH PROPOFOL N/A 10/08/2019   Procedure: ESOPHAGOGASTRODUODENOSCOPY (EGD) WITH PROPOFOL;  Surgeon: Yetta Flock, MD;  Location: [facility];  Service: Gastroenterology;  Laterality: N/A;  . HEMOSTASIS CLIP PLACEMENT  10/08/2019   Procedure: HEMOSTASIS CLIP PLACEMENT;  Surgeon: Yetta Flock, MD;  Location: [facility];  Service: Gastroenterology;;  . HEMOSTASIS CONTROL  10/08/2019   Procedure: HEMOSTASIS CONTROL;  Surgeon: Yetta Flock, MD;  Location: [facility];  Service: Gastroenterology;;  . LEFT HEART CATH AND CORONARY ANGIOGRAPHY N/A 09/30/2019   Procedure: LEFT HEART CATH AND CORONARY ANGIOGRAPHY;  Surgeon: Martinique, Peter M, MD;  Location: [facility];  Service: Cardiovascular;  Laterality: N/A;  . LEFT HEART CATH AND CORS/GRAFTS ANGIOGRAPHY N/A 09/27/2019   Procedure: LEFT HEART CATH AND CORS/GRAFTS ANGIOGRAPHY;  Surgeon: Minna Merritts, MD;  Location: [facility];  Service: Cardiovascular;  Laterality: N/A;   Family History  Problem Relation Age of Onset  . Stroke Mother        died @ 49  . Multiple myeloma Father        died @ 29  . Multiple myeloma Sister    Social History:  reports that she has never smoked. She has never used smokeless tobacco. She reports that she does not drink alcohol or use drugs. Allergies:  Allergies  Allergen Reactions  . Onion Anaphylaxis and Hives  . Monosodium Glutamate Nausea Only    Headache Fatigue   Medications Prior to Admission  Medication Sig Dispense Refill  . acetaminophen (TYLENOL) 500 MG tablet Take 500 mg by mouth every 6 (six) hours as needed for mild pain.    Marland Kitchen acetaminophen (TYLENOL) 650 MG CR tablet Take 1,300 mg by mouth in the morning and at bedtime.    . B Complex-C (B-COMPLEX WITH VITAMIN C) tablet Take 1 tablet by mouth daily.    Marland Kitchen Bioflavonoid Products (ESTER C  PO) Take 1 tablet by mouth daily.    . Biotin w/ Vitamins C & E (HAIR/SKIN/NAILS PO) Take 3 capsules by mouth daily.    . Calcium Carbonate-Vit D-Min (CALCIUM-VITAMIN D-MINERALS) 600-800 MG-UNIT CHEW Chew by mouth.    . diphenhydrAMINE (BENADRYL) 25 MG tablet Take 50 mg by mouth at bedtime.    Marland Kitchen DM-Doxylamine-Acetaminophen (VICKS NYQUIL COLD & FLU PO) Take 2 capsules by mouth at bedtime. For allergies    . DM-Phenylephrine-Acetaminophen (VICKS DAYQUIL COLD & FLU) 10-5-325 MG CAPS Take 2 capsules by mouth daily. For allergies    . docusate sodium (COLACE) 100 MG capsule Take 100 mg by mouth 2 (two) times daily.    . famotidine-calcium carbonate-magnesium hydroxide (PEPCID COMPLETE) 10-800-165 MG chewable tablet Chew 1 tablet by mouth daily as needed (acid reflux).    . Ginkgo Biloba 120 MG CAPS Take 120 mg by mouth daily.    Marland Kitchen glucosamine-chondroitin 500-400 MG tablet Take 2 tablets by mouth daily.    . hydrocortisone cream 1 % Apply 1 application topically 2 (two) times daily as needed for itching.    . Melatonin 10 MG CAPS Take 20 mg by mouth at bedtime.    . Multiple Vitamin (MULTIVITAMIN WITH MINERALS) TABS tablet Take 1 tablet by mouth daily.    Marland Kitchen neomycin-bacitracin-polymyxin (NEOSPORIN) [phone] ointment Apply 1 application topically 3 (three) times daily as needed (wound care).    Marland Kitchen omeprazole (PRILOSEC) 20  MG capsule Take 20 mg by mouth daily.    Marland Kitchen OVER THE COUNTER MEDICATION Take 1 capsule by mouth daily. Echinacea & Goldenseal    . phenylephrine (SUDAFED PE) 10 MG TABS tablet Take 10 mg by mouth daily.     . psyllium (REGULOID) 0.52 g capsule Take 1.04 g by mouth daily.    . ranitidine (ZANTAC) 150 MG capsule Take 150 mg by mouth 2 (two) times daily as needed for heartburn.    . Simethicone 125 MG TABS Take 250 mg by mouth daily.      Drug Regimen Review  Drug regimen was reviewed and remains appropriate with no significant issues identified  Home: Home Living Family/patient  expects to be discharged to:: Private residence Living Arrangements: Non-relatives/Friends Available Help at Discharge: Friend(s) Type of Home: House Home Access: Stairs to enter Technical brewer of Steps: curb Entrance Stairs-Rails: None Home Layout: One level Bathroom Shower/Tub: Multimedia programmer: Standard Home Equipment: None   Functional History: Prior Function Level of Independence: Independent Comments: per chart pt has not been limited physically  Functional Status:  Mobility: Bed Mobility Overal bed mobility: Needs Assistance Bed Mobility: Rolling, Sidelying to Sit Rolling: Mod assist Sidelying to sit: Mod assist(up from her rt side) Supine to sit: Total assist, +2 for safety/equipment, +2 for physical assistance, HOB elevated Sit to supine: Max assist Sit to sidelying: Max assist, +2 for physical assistance, +2 for safety/equipment General bed mobility comments: pt requires incr time for processing and assist for all mobility due to weakness; pt able to use RUE to assist with raising torso from side to sit; pt able to assist with scooting left hip out to EOB to get left foot on the floor Transfers Overall transfer level: Needs assistance Equipment used: Ambulation equipment used Transfer via Lift Equipment: Stedy Transfers: Sit to/from Stand, Risk manager Sit to Stand: +2 physical assistance, +2 safety/equipment, Mod assist Stand pivot transfers: +2 physical assistance, +2 safety/equipment, Mod assist General transfer comment: initiating standing much better; transferred x 4 requiring increased assist as she fatigued (on/off BSC; standing for pericare) Ambulation/Gait Ambulation/Gait assistance: (unable.) Gait Distance (Feet): 1 Feet Assistive device: None Gait Pattern/deviations: Decreased stride length, Step-to pattern, Wide base of support General Gait Details: not yet appropriate Gait velocity: slow, controlled     ADL: ADL Overall ADL's : Needs assistance/impaired Eating/Feeding: Minimal assistance, Sitting Eating/Feeding Details (indicate cue type and reason): drinking from cup Grooming: Wash/dry face, Minimal assistance, Sitting Grooming Details (indicate cue type and reason): uses R hand, cues for task Upper Body Bathing: Sitting, Moderate assistance Lower Body Bathing: Sit to/from stand, Maximal assistance, +2 for physical assistance Upper Body Dressing : Moderate assistance, Sitting Lower Body Dressing: Maximal assistance, Bed level Lower Body Dressing Details (indicate cue type and reason): to don socks, Pt able to help with lifting legs Toilet Transfer: Maximal assistance, +2 for physical assistance, +2 for safety/equipment, Stand-pivot, Cueing for sequencing Toilet Transfer Details (indicate cue type and reason): simulated OOB to chair Functional mobility during ADLs: Moderate assistance, +2 for safety/equipment, +2 for physical assistance(to recliner) General ADL Comments: pt unable to progress past stand pivot to chair with total (A) to block L LE. pt reliant on staff for transfer  Cognition: Cognition Overall Cognitive Status: No family/caregiver present to determine baseline cognitive functioning Arousal/Alertness: Lethargic Orientation Level: Oriented to person, Oriented to place, Disoriented to time, Oriented to situation Attention: Focused, Sustained Focused Attention: Impaired Focused Attention Impairment: Verbal basic Sustained Attention: Impaired Sustained  Attention Impairment: Verbal basic, Verbal complex Memory: Impaired Memory Impairment: Retrieval deficit, Decreased recall of new information Cognition Arousal/Alertness: Awake/alert(prefers eyes closed 2/2 rt eye infection) Behavior During Therapy: Restless Overall Cognitive Status: No family/caregiver present to determine baseline cognitive functioning Area of Impairment: Orientation, Attention, Memory, Following  commands, Safety/judgement, Awareness, Problem solving Orientation Level: Time, Situation("July" "I'm weak on the left, but I didn't have a stroke") Current Attention Level: Sustained Memory: Decreased short-term memory Following Commands: Follows one step commands with increased time Safety/Judgement: Decreased awareness of safety, Decreased awareness of deficits Awareness: Intellectual Problem Solving: Slow processing, Decreased initiation, Difficulty sequencing, Requires verbal cues, Requires tactile cues General Comments: more confused today; speaking of a stack of paperwork I should have on my desk, "your boss is mean" Difficult to assess due to: Impaired communication(Difficult to understand at times)  Physical Exam: Blood pressure 119/64, pulse 70, temperature 98.5 F (36.9 C), temperature source Oral, resp. rate 18, height _0  (1.575 m), weight 58.2 kg, SpO2 100 %. Physical Exam  Vitals reviewed. Constitutional: She appears well-developed and well-nourished.  HENT:  Head: Normocephalic and atraumatic.  Eyes: Right eye exhibits no discharge. Left eye exhibits no discharge.  Keeps eyes closed  Neck: No tracheal deviation present. No thyromegaly present.  Respiratory: Effort normal. No respiratory distress.  GI: Soft. She exhibits no distension.  Musculoskeletal:     Comments: No edema or tenderness in extremities  Neurological: She is alert.  She does follow some simple commands providing her name and age.   A&Ox1 Dysarthria Facial weakness Limited medical historian.   Examination was limited as she continued to demand to go back to bed and lack of participation. Distracted Limited eye contact Motor: RUE: 5/5 proximal to distal LUE: 0/5 proximal to distal B/l LE: ?2/5 proximal to distal (limited due to participation)  Skin: Skin is warm and dry.  Psychiatric: Her affect is blunt. Her speech is delayed. She is slowed.    Results for orders placed or performed during  the hospital encounter of 09/27/19 (from the past 48 hour(s))  Glucose, capillary     Status: Abnormal   Collection Time: 10/14/19  4:59 PM  Result Value Ref Range   Glucose-Capillary 116 (H) 70 - 99 mg/dL    Comment: Glucose reference range applies only to samples taken after fasting for at least 8 hours.   Comment 1 Notify RN    Comment 2 Document in Chart   Glucose, capillary     Status: Abnormal   Collection Time: 10/14/19  9:14 PM  Result Value Ref Range   Glucose-Capillary 124 (H) 70 - 99 mg/dL    Comment: Glucose reference range applies only to samples taken after fasting for at least 8 hours.  Magnesium     Status: None   Collection Time: 10/15/19  3:45 AM  Result Value Ref Range   Magnesium 1.8 1.7 - 2.4 mg/dL    Comment: Performed at [facility] [address] [place], [place] [address]  Basic metabolic panel     Status: Abnormal   Collection Time: 10/15/19  3:45 AM  Result Value Ref Range   Sodium 138 135 - 145 mmol/L   Potassium 3.7 3.5 - 5.1 mmol/L   Chloride 103 98 - 111 mmol/L   CO2 25 22 - 32 mmol/L   Glucose, Bld 127 (H) 70 - 99 mg/dL    Comment: Glucose reference range applies only to samples taken after fasting for at least 8 hours.  BUN 9 8 - 23 mg/dL   Creatinine, Ser 0.81 0.44 - 1.00 mg/dL   Calcium 9.2 8.9 - 10.3 mg/dL   GFR calc non Af Amer >60 >60 mL/min   GFR calc Af Amer >60 >60 mL/min   Anion gap 10 5 - 15    Comment: Performed at [facility] [address] [place], [place] [address]  Glucose, capillary     Status: Abnormal   Collection Time: 10/15/19  6:03 AM  Result Value Ref Range   Glucose-Capillary 117 (H) 70 - 99 mg/dL    Comment: Glucose reference range applies only to samples taken after fasting for at least 8 hours.  Glucose, capillary     Status: Abnormal   Collection Time: 10/15/19 11:53 AM  Result Value Ref Range   Glucose-Capillary 164 (H) 70 - 99 mg/dL    Comment: Glucose reference range applies only to samples  taken after fasting for at least 8 hours.  Glucose, capillary     Status: Abnormal   Collection Time: 10/15/19  4:57 PM  Result Value Ref Range   Glucose-Capillary 137 (H) 70 - 99 mg/dL    Comment: Glucose reference range applies only to samples taken after fasting for at least 8 hours.  Glucose, capillary     Status: Abnormal   Collection Time: 10/15/19  9:42 PM  Result Value Ref Range   Glucose-Capillary 134 (H) 70 - 99 mg/dL    Comment: Glucose reference range applies only to samples taken after fasting for at least 8 hours.   Comment 1 Notify RN    Comment 2 Document in Chart   Magnesium     Status: None   Collection Time: 10/16/19  4:01 AM  Result Value Ref Range   Magnesium 1.8 1.7 - 2.4 mg/dL    Comment: Performed at [facility], [place] [address] [place], [place] [address]  Basic metabolic panel     Status: Abnormal   Collection Time: 10/16/19  4:01 AM  Result Value Ref Range   Sodium 138 135 - 145 mmol/L   Potassium 3.8 3.5 - 5.1 mmol/L   Chloride 104 98 - 111 mmol/L   CO2 22 22 - 32 mmol/L   Glucose, Bld 122 (H) 70 - 99 mg/dL    Comment: Glucose reference range applies only to samples taken after fasting for at least 8 hours.   BUN 8 8 - 23 mg/dL   Creatinine, Ser 0.79 0.44 - 1.00 mg/dL   Calcium 9.4 8.9 - 10.3 mg/dL   GFR calc non Af Amer >60 >60 mL/min   GFR calc Af Amer >60 >60 mL/min   Anion gap 12 5 - 15    Comment: Performed at [facility] [address], [place], [place] [address]  Glucose, capillary     Status: Abnormal   Collection Time: 10/16/19  6:37 AM  Result Value Ref Range   Glucose-Capillary 112 (H) 70 - 99 mg/dL    Comment: Glucose reference range applies only to samples taken after fasting for at least 8 hours.  Glucose, capillary     Status: Abnormal   Collection Time: 10/16/19 11:52 AM  Result Value Ref Range   Glucose-Capillary 153 (H) 70 - 99 mg/dL    Comment: Glucose reference range applies only to samples taken after fasting  for at least 8 hours.   No results found.     Medical Problem List and Plan: 1.  Altered mental status  left hemiparesis secondary to right MCA infarction along with a small left CR and splenium infarct embolic pattern likely due to transient cardiomyopathy with low EF after cardiac catheterization for non-STEMI with stenting complicated by groin hematoma  -patient may shower  -ELOS/Goals: 15-19 days/Supervision/Min A  Admit to CIR 2.  Antithrombotics: -DVT/anticoagulation: SCDs  -antiplatelet therapy: Plavix 3. Pain Management: Tylenol as needed 4. Mood: Amantadine 100 mg twice daily, melatonin 3 mg nightly  -antipsychotic agents: N/A 5. Neuropsych: This patient is not capable of making decisions on her own behalf. 6. Skin/Wound Care: Routine skin checks 7. Fluids/Electrolytes/Nutrition: Routine in and outs. CMP ordered for tomorrow 8.  GI bleed/duodenal ulcer.  Status post EGD 10/08/2019 with epi and hemostasis clips x5.  PPI twice daily  See #9 9.  Acute blood loss anemia.  CBC ordered for tomorrow 10.  AKI.  Resolved.  CMP ordered for tomorrow 11.  Acute urinary tract infection.  Patient completed 5-day course amoxicillin 10/17/2019 12.  Post stroke dysphagia.  Dysphagia #1 nectar liquids.  Follow-up speech therapy  Advance diet as tolerated 13.  Keratoconjunctivitis.  Follow-up ophthalmology services.  Erythromycin 4 times a day x7 days initiated 10/12/19. 14.  Hypertension.  Coreg 3.125 mg twice daily.    Monitor with increased mobility 15.  Hyperlipidemia.  Lipitor  Completed 16.  Prediabetes.  Hemoglobin A1c 5.9.  SSI  Monitor with increased mobility 17. Acute systolic CHF  Daily weight  Cathlyn Parsons, PA-C 10/16/2019   I have personally performed a face to face diagnostic evaluation, including, but not limited to relevant history and physical exam findings, of this patient and developed relevant assessment and plan.  Additionally, I have reviewed and concur with the  physician assistant's documentation above.  Delice Lesch, MD, ABPMR The patient's status has not changed. The original post admission physician evaluation remains appropriate, and any changes from the pre-admission screening or documentation from the acute chart are noted above.   Delice Lesch, MD, ABPMR

## 2019-10-16 NOTE — TOC Transition Note (Signed)
Transition of Care [facility]) - CM/SW Discharge Note   Patient Details  Name: Brittney Kennedy MRN: [id number] Date of Birth: Mar 30, 1951  Transition of Care [facility]) CM/SW Contact:  Kermit Balo, RN Phone Number: 10/16/2019, 10:16 AM   Clinical Narrative:    Pt is discharging to CIR today. CM signing off.    Final next level of care: IP Rehab Facility Barriers to Discharge: No Barriers Identified   Patient Goals and CMS Choice Patient states their goals for this hospitalization and ongoing recovery are:: patient unable to state goals due to lethargy, not engaging in discussion CMS Medicare.gov Compare Post Acute Care list provided to:: Patient Represenative (must comment) Choice offered to / list presented to : Adult Children  Discharge Placement                       Discharge Plan and Services                                     Social Determinants of Health (SDOH) Interventions     Readmission Risk Interventions No flowsheet data found.

## 2019-10-16 NOTE — IPOC Note (Signed)
Individualized overall Plan of Care [facility]) Patient Details Name: Laci Frenkel MRN: [id number] DOB: 04/25/1951  Admitting Diagnosis: Embolic infarction [facility])  Hospital Problems: Principal Problem:   Embolic infarction [facility]) Active Problems:   Right middle cerebral artery stroke (HCC)   Acute systolic congestive heart failure ([place])   Essential hypertension   Keratoconjunctivitis     Functional Problem List: Nursing Bladder, Bowel, Safety, Endurance, Medication Management  PT Balance, Perception, Behavior, Safety, Edema, Sensory, Endurance, Skin Integrity, Motor, Nutrition, Pain  OT Balance, Endurance, Motor, Safety, Perception, Pain, Cognition, Behavior, Vision, Nutrition  SLP Cognition, Linguistic, Nutrition, Perception  TR         Basic ADL's: OT Eating, Grooming, Bathing, Dressing, Toileting     Advanced  ADL's: OT       Transfers: PT Bed Mobility, Bed to Chair, Car, Manufacturing systems engineer, Metallurgist: PT Ambulation, Emergency planning/management officer, Stairs     Additional Impairments: OT Fuctional Use of Upper Extremity  SLP Swallowing, Communication, Social Cognition expression Problem Solving, Memory, Social Interaction, Attention, Awareness  TR      Anticipated Outcomes Item Anticipated Outcome  Self Feeding supervision  Swallowing  Min A   Basic self-care  min A  Toileting  min A   Bathroom Transfers Min A  Bowel/Bladder  to be continent x 2 while in rehab; LBM per nurse 04/14  Transfers  CGA  Locomotion  CGA using LRAD  Communication  Min A  Cognition  Min A  Pain  less than 2 out of 10 on pain scale  Safety/Judgment  to remain fall free while in rehab   Therapy Plan: PT Intensity: Minimum of 1-2 x/day ,45 to 90 minutes PT Frequency: 5 out of 7 days PT Duration Estimated Length of Stay: ~3.5-4 weeks OT Intensity: Minimum of 1-2 x/day, 45 to 90 minutes OT Frequency: 5 out of 7 days OT Duration/Estimated Length of Stay: 28-30 days SLP  Intensity: Minumum of 1-2 x/day, 30 to 90 minutes SLP Frequency: 3 to 5 out of 7 days SLP Duration/Estimated Length of Stay: 4 weeks    Team Interventions: Nursing Interventions Patient/Family Education, Disease Management/Prevention, Discharge Planning, Bladder Management, Cognitive Remediation/Compensation, Bowel Management, Dysphagia/Aspiration Precaution Training  PT interventions Ambulation/gait training, Community reintegration, DME/adaptive equipment instruction, Neuromuscular re-education, Psychosocial support, UE/LE Strength taining/ROM, Stair training, Wheelchair propulsion/positioning, Training and development officer, Discharge planning, Functional electrical stimulation, Pain management, Skin care/wound management, Therapeutic Activities, UE/LE Coordination activities, Cognitive remediation/compensation, Disease management/prevention, Functional mobility training, Patient/family education, Splinting/orthotics, Therapeutic Exercise, Visual/perceptual remediation/compensation  OT Interventions Balance/vestibular training, Cognitive remediation/compensation, Discharge planning, Functional mobility training, DME/adaptive equipment instruction, Neuromuscular re-education, Pain management, Patient/family education, Psychosocial support, Therapeutic Activities, Self Care/advanced ADL retraining, Therapeutic Exercise, UE/LE Strength taining/ROM, UE/LE Coordination activities, Visual/perceptual remediation/compensation, Functional electrical stimulation  SLP Interventions Functional tasks, Cognitive remediation/compensation, Patient/family education, Dysphagia/aspiration precaution training  TR Interventions    SW/CM Interventions     Barriers to Discharge MD  Medical stability, Behavior, and Nutritional means  Nursing Incontinence    PT Decreased caregiver support, Medical stability    OT      SLP Decreased caregiver support    SW       Team Discharge Planning: Destination: PT-Home ,OT-  Home , SLP-(TBD) Projected Follow-up: PT-Home health PT, 24 hour supervision/assistance, OT-  Outpatient OT, SLP-(TBD) Projected Equipment Needs: PT-To be determined, OT- Tub/shower seat, SLP-To be determined Equipment Details: PT- , OT-  Patient/family involved in discharge planning: PT- Patient, Family member/caregiver,  OT-Patient, Family member/caregiver, SLP-Patient,  Family member/caregiver  MD ELOS: 18-23 days. Medical Rehab Prognosis:  Good Assessment:  69 year old right-handed female with unremarkable past medical history. She presented on 09/27/2019 with intermittent nausea/vomitting x1 week as well as epigastric substernal pain.  Admission chemistries potassium 3.0, troponin high-sensitivity 674, SARS coronavirus negative.  EKG question old septal infarct with lateral ST-T changes CT abdomen pelvis negative for acute intra-abdominal or pelvic pathology.  She was placed on IV heparin.  Cardiac catheterization revealed severe multivessel CAD with LV dysfunction, ejection fraction of 25%. Patient was stented. Acute systolic heart failure due to an ICM and follow-up management per cardiology services.  Patient did develop right groin hematoma after catheterization and currently maintained on Lovenox as well as low-dose aspirin.  Neurology consulted on 09/30/19 for lethargy, left facial droop, left upper extremity weakness.  Cranial CT showed right basal ganglia infarction as well as remote appearing right pontine infarction.  CT angiogram of the neck moderate stenosis distal right M1 segment.  Moderate to severe stenosis inferior branch of right middle cerebral artery.  No large vessel occlusion.  Follow-up CT of the head 10/01/2019 showed acute infarct right parietal lobe age-indeterminate infarct right caudate.  EEG negative for seizures.  Plavix was added to patient's prophylaxis for CVA.  Gastroenterology services consulted 10/08/2019 for melena BUN markedly elevated 49-50, hemoglobin dipping to 8.7  down to 7.3, WBC 17,700.  EGD upper endoscopy completed 10/08/2019 per Dr. Adela Lank showed actively bleeding duodenal ulcer with visible vessel treated with epi and hemostasis clips x5.  Latest hemoglobin stable 8.4 on 4/12 and renal function improved BUN 9 creatinine 0.81.  GI had recommended PPI twice daily and outpatient follow-up.  Patient's aspirin is been discontinued and currently maintained on Plavix.  Hospital course further complicated by UTI, urine culture greater 100,000 Aerococcus viridans and patient completed 5-day course of amoxicillin.  Ophthalmology consulted 10/12/2019 Dr. Maryagnes Amos for keratoconjunctivitis who recommended erythromycin ophthalmic 4 times daily x7 days with warm compresses.  Patient is currently on a dysphagia #1 nectar thick liquid diet.  Patient with functional deficits with mobility, transfers, self-care, self-care, swallowing, cognition.  Will set goals for Min A with PT/OT/SLP.  Due to the current state of emergency, patients may not be receiving their 3-hours of Medicare-mandated therapy.  See Team Conference Notes for weekly updates to the plan of care

## 2019-10-16 NOTE — Discharge Summary (Signed)
Physician Discharge Summary  Brittney Kennedy FAO:[id number] DOB: 11-07-1950 DOA: 09/27/2019  PCP: Patient, No Pcp Per  Admit date: 09/27/2019 Discharge date: 10/16/2019  Admitted From: Home Home Disposition: CIR  Recommendations for Outpatient Follow-up:  1. Follow up with PCP in 1-2 weeks 2. Please obtain BMP/CBC in one week your next doctors visit.  3. Daily Plavix, Lipitor 80 mg daily 4. Protonix 40 mg twice daily 5. Amoxicillin 3 times daily for 2 more days 6. Coreg 3.125 mg twice daily 7. Follow-up patient neurology, cardiology 8. Erythromycin eyedrops for 5 more days 9. Follow-up outpatient ophthalmology   Discharge Condition: Stable CODE STATUS: Full code Diet recommendation: Heart healthy  Brief/Interim Summary: 69 year old with history of systolic CHF EF 20%, CAD, duodenal ulcer admitted for atypical chest pain.  NSTEMI showed severe multivessel disease transferred to [facility] course complicated by right groin hematoma.  During hospitalization also developed upper GI bleed secondary to duodenal ulcer, EGD done 4/6.  Started on PPI, hemoglobin remained stable.  Patient was transferred to [facility] service on 4/7.  PT recommended CIR.  Non-STEMI, CAD Systolic congestive heart failure with a EF of 20% -Status post PCI per cardiology team, on Coreg, Plavix and statin -Seen by cardiology team.  Poor surgical candidate. -Repeat echo on 3/31 showed improved EF to 50%.  Recommending outpatient follow-up with cardiology  Right groin hematoma status post fall -Ultrasound negative for pseudoaneurysm.  Hematoma appears to be stable at the cath site.  Acute MCA stroke with left hemiplegia -Complicated during hospital stay.  Seen by neurology, outpatient follow-up in 4 weeks -Daily Plavix.  No need for aspirin -Lipitor 80 mg daily  Acute upper GI bleed secondary to duodenal ulcer -Status post EGD 4/6-endoclips placed.  Recommending PPI twice daily and outpatient  follow-up  Acute urinary tract infection -Previous hospitalist discussed case with infectious disease, recommended 5 days of amoxicillin, last day 4/15.  Acute encephalopathy -Currently stable.  This is secondary to acute CVA  Dysphagia -Dysphagia 1 diet  Keratoconjunctivitis -Seen by ophthalmology recommending erythromycin ophthalmic 4 times daily X 7 days, 5 more days remaining -Warm compresses.  Outpatient follow-up ophthalmology    Discharge Diagnoses:  Principal Problem:   NSTEMI (non-ST elevated myocardial infarction) (HCC) Active Problems:   Acute systolic CHF (congestive heart failure) (HCC)   Cerebral embolism with cerebral infarction   Malnutrition of moderate degree   Upper GI bleed   Status post coronary artery stent placement   Acute blood loss anemia   Duodenal ulcer    Consultations:  Neurology  Cardiology  Ophthalmology  Subjective: Feels okay, no complaints.  Still somewhat dysarthric  Discharge Exam: Vitals:   10/15/19 2348 10/16/19 0439  BP: (!) 155/73 (!) 155/70  Pulse: 79 81  Resp: (!) 22 18  Temp: 98.5 F (36.9 C) 98.5 F (36.9 C)  SpO2: 100% 98%   Vitals:   10/15/19 1548 10/15/19 1943 10/15/19 2348 10/16/19 0439  BP: (!) 146/75 (!) 145/92 (!) 155/73 (!) 155/70  Pulse: 82 79 79 81  Resp: 15 18 (!) 22 18  Temp: 98.7 F (37.1 C) 98.1 F (36.7 C) 98.5 F (36.9 C) 98.5 F (36.9 C)  TempSrc: Oral Oral Oral Oral  SpO2: 99% 97% 100% 98%  Weight:      Height:        General: Pt is alert, awake, not in acute distress Cardiovascular: RRR, S1/S2 +, no rubs, no gallops Respiratory: CTA bilaterally, no wheezing, no rhonchi Abdominal: Soft, NT, ND,  bowel sounds + Extremities: no edema, no cyanosis Still has dysarthric speech  Discharge Instructions  Discharge Instructions    Amb Referral to Cardiac Rehabilitation   Complete by: As directed    Diagnosis:  NSTEMI Coronary Stents     After initial evaluation and assessments  completed: Virtual Based Care may be provided alone or in conjunction with Phase 2 Cardiac Rehab based on patient barriers.: Yes   Ambulatory referral to Neurology   Complete by: As directed    Follow up with Dr. Pearlean Brownie at [facility] in 4-6 weeks. Too complicated for NP to follow. Thanks.     Allergies as of 10/16/2019      Reactions   Onion Anaphylaxis, Hives   Monosodium Glutamate Nausea Only   Headache Fatigue      Medication List    STOP taking these medications   acetaminophen 500 MG tablet Commonly known as: TYLENOL   acetaminophen 650 MG CR tablet Commonly known as: TYLENOL   omeprazole 20 MG capsule Commonly known as: PRILOSEC     TAKE these medications   amoxicillin 500 MG capsule Commonly known as: AMOXIL Take 1 capsule (500 mg total) by mouth every 8 (eight) hours for 2 days.   atorvastatin 80 MG tablet Commonly known as: LIPITOR Take 1 tablet (80 mg total) by mouth daily at 6 PM.   B-complex with vitamin C tablet Take 1 tablet by mouth daily.   Calcium-Vitamin D-Minerals 600-800 MG-UNIT Chew Chew by mouth.   carvedilol 3.125 MG tablet Commonly known as: COREG Take 1 tablet (3.125 mg total) by mouth 2 (two) times daily with a meal.   clopidogrel 75 MG tablet Commonly known as: PLAVIX Take 1 tablet (75 mg total) by mouth daily with breakfast. Start taking on: October 17, 2019   diphenhydrAMINE 25 MG tablet Commonly known as: BENADRYL Take 50 mg by mouth at bedtime.   docusate sodium 100 MG capsule Commonly known as: COLACE Take 100 mg by mouth 2 (two) times daily.   ESTER C PO Take 1 tablet by mouth daily.   famotidine-calcium carbonate-magnesium hydroxide 10-800-165 MG chewable tablet Commonly known as: PEPCID COMPLETE Chew 1 tablet by mouth daily as needed (acid reflux).   Ginkgo Biloba 120 MG Caps Take 120 mg by mouth daily.   glucosamine-chondroitin 500-400 MG tablet Take 2 tablets by mouth daily.   HAIR/SKIN/NAILS PO Take 3 capsules by  mouth daily.   hydrocortisone cream 1 % Apply 1 application topically 2 (two) times daily as needed for itching.   Melatonin 10 MG Caps Take 20 mg by mouth at bedtime.   multivitamin with minerals Tabs tablet Take 1 tablet by mouth daily.   neomycin-bacitracin-polymyxin [phone] ointment Apply 1 application topically 3 (three) times daily as needed (wound care).   OVER THE COUNTER MEDICATION Take 1 capsule by mouth daily. Echinacea & Goldenseal   pantoprazole 40 MG tablet Commonly known as: Protonix Take 1 tablet (40 mg total) by mouth 2 (two) times daily before a meal.   phenylephrine 10 MG Tabs tablet Commonly known as: SUDAFED PE Take 10 mg by mouth daily.   psyllium 0.52 g capsule Commonly known as: REGULOID Take 1.04 g by mouth daily.   ranitidine 150 MG capsule Commonly known as: ZANTAC Take 150 mg by mouth 2 (two) times daily as needed for heartburn.   Simethicone 125 MG Tabs Take 250 mg by mouth daily.   Vicks DayQuil Cold & Flu 10-5-325 MG Caps Generic drug: DM-Phenylephrine-Acetaminophen Take 2 capsules  by mouth daily. For allergies   VICKS NYQUIL COLD & FLU PO Take 2 capsules by mouth at bedtime. For allergies      Follow-up Information    [facility]. Schedule an appointment as soon as possible for a visit in 4 week(s).   Specialty: Neurology Contact information: [address] [place] [address] [phone]       Antonieta Iba, MD. Schedule an appointment as soon as possible for a visit in 2 week(s).   Specialty: Cardiology Contact information: [address] [place] [address] [phone]          Allergies  Allergen Reactions  . Onion Anaphylaxis and Hives  . Monosodium Glutamate Nausea Only    Headache Fatigue    You were cared for by a hospitalist during your hospital stay. If you have any questions about your discharge medications or the care you received  while you were in the hospital after you are discharged, you can call the unit and asked to speak with the hospitalist on call if the hospitalist that took care of you is not available. Once you are discharged, your primary care physician will handle any further medical issues. Please note that no refills for any discharge medications will be authorized once you are discharged, as it is imperative that you return to your primary care physician (or establish a relationship with a primary care physician if you do not have one) for your aftercare needs so that they can reassess your need for medications and monitor your lab values.   Procedures/Studies: CT ABDOMEN PELVIS WO CONTRAST  Result Date: 09/28/2019 CLINICAL DATA:  Thoracic aortic disease. Pre operative planning for CABG. EXAM: CT CHEST, ABDOMEN AND PELVIS WITHOUT CONTRAST TECHNIQUE: Multidetector CT imaging of the chest, abdomen and pelvis was performed following the standard protocol without IV contrast. COMPARISON:  CT scan of the abdomen dated 09/26/2019 FINDINGS: CT CHEST FINDINGS Cardiovascular: Aortic atherosclerosis without dilatation of the thoracic aorta. Coronary artery calcifications. Heart is at the upper limits of normal in size. Small pericardial effusion. Mediastinum/Nodes: 9 mm nodule in the right lobe of the thyroid gland. Trachea is normal. Esophagus is normal. Lungs/Pleura: Small bilateral pleural effusions minimal secondary atelectasis at the bases. Lungs are otherwise clear. Musculoskeletal: No chest wall mass or suspicious bone lesions identified. CT ABDOMEN PELVIS FINDINGS Hepatobiliary: Slight hepatic steatosis. Cholecystectomy. No biliary ductal dilatation. Pancreas: Normal. Spleen: Normal. Adrenals/Urinary Tract: Adrenal glands are normal. There are persistent nephrograms bilaterally without hydronephrosis. Tiny area of linear enhancement in the medial aspect of the upper pole is not felt to be significant. Stomach/Bowel:  Stomach is within normal limits. Appendix appears normal. No evidence of bowel wall thickening, distention, or inflammatory changes. There are a few diverticula in the left side of the colon. No diverticulitis. Vascular/Lymphatic: Aortic atherosclerosis. No enlarged abdominal or pelvic lymph nodes. Reproductive: Uterus and ovaries appear normal. Patient's history reports abdominal hysterectomy but that does not appear to be accurate. Other: No abdominal wall hernia or abnormality. No abdominopelvic ascites. Musculoskeletal: There is an incompletely visualized 5 cm intramuscular hematoma in the right rectus femoris muscle. There is slight hemorrhage in the adjacent adductor longus muscle without a defined hematoma. IMPRESSION: 1. Aortic atherosclerosis.  No aneurysmal dilatation or dissection. 2. Incompletely visualized 5 cm intramuscular hematoma in the right rectus femoris muscle. Slight hemorrhage in the adjacent adductor longus muscle without a defined hematoma. 3. Persistent nephrograms bilaterally which could represent acute tubular necrosis.  4. Small bilateral pleural effusions with minimal secondary atelectasis at the bases. 5. Slight hepatic steatosis. 6. 9 mm nodule in the right lobe of the thyroid gland. No follow-up recommended. This recommendation follows ACR consensus guidelines: Managing Incidental Thyroid Nodules Detected on Imaging: White Paper of the ACR Incidental Thyroid Findings Committee. J Am Coll Radiol 2015; 12:143-150. Aortic Atherosclerosis (ICD10-I70.0). Electronically Signed   By: Francene Boyers M.D.   On: 09/28/2019 17:01   CT ANGIO HEAD W OR WO CONTRAST  Result Date: 09/30/2019 CLINICAL DATA:  Stroke. Left facial droop and left arm weakness. Slurred speech. EXAM: CT ANGIOGRAPHY HEAD AND NECK CT PERFUSION BRAIN TECHNIQUE: Multidetector CT imaging of the head and neck was performed using the standard protocol during bolus administration of intravenous contrast. Multiplanar CT image  reconstructions and MIPs were obtained to evaluate the vascular anatomy. Carotid stenosis measurements (when applicable) are obtained utilizing NASCET criteria, using the distal internal carotid diameter as the denominator. Multiphase CT imaging of the brain was performed following IV bolus contrast injection. Subsequent parametric perfusion maps were calculated using RAPID software. CONTRAST:  OMNIPAQUE IOHEXOL 350 MG/ML SOLN COMPARISON:  CT head 09/30/2019 FINDINGS: CTA NECK FINDINGS Aortic arch: Atherosclerotic aortic arch. Three-vessel arch. Proximal great vessels widely patent. Right carotid system: Atherosclerotic calcification distal right common carotid artery without significant stenosis. Atherosclerotic disease right carotid bifurcation without significant stenosis. Tortuous right internal carotid artery. Left carotid system: Mild atherosclerotic disease left common carotid artery without significant stenosis. Left carotid bifurcation widely patent without stenosis. Vertebral arteries: Left vertebral artery dominant. Both vertebral arteries widely patent to the basilar without stenosis. Skeleton: No acute skeletal abnormality. Cervical spine degenerative changes relatively mild. Other neck: Negative for mass or adenopathy. 1 cm right thyroid nodule. No further imaging necessary. Upper chest: Lung apices clear bilaterally. Small left pleural effusion. Review of the MIP images confirms the above findings CTA HEAD FINDINGS Anterior circulation: Cavernous carotid patent bilaterally without significant stenosis. Moderate stenosis distal right M1 segment. Moderate to severe focal stenosis inferior division of right middle cerebral artery. This corresponds to the area of decreased perfusion on the perfusion study. No vessel occlusion or thrombus identified. Both anterior cerebral arteries patent without stenosis. Left MCA patent without significant stenosis. Posterior circulation: Both vertebral arteries  patent to the basilar. PICA patent bilaterally. Basilar widely patent. Superior cerebellar and posterior cerebral arteries patent bilaterally. Mild to moderate stenosis proximal right posterior cerebral artery. Moderate stenosis distal right posterior cerebral artery. Fetal origin of the left posterior cerebral artery. Venous sinuses: Normal venous enhancement. Anatomic variants: None Review of the MIP images confirms the above findings CT Brain Perfusion Findings: ASPECTS: 9 CBF (<30%) Volume: 0mL Perfusion (Tmax>6.0s) volume: 29mL Mismatch Volume: 29mL Infarction Location:Right parietal lobe. IMPRESSION: 1. 29 mL of delayed perfusion right parietal lobe. No fixed infarct on CT perfusion. 2. Moderate stenosis distal right M1 segment. Moderate to severe stenosis inferior branch of right middle cerebral artery. No large vessel occlusion. 3. Moderate stenosis right PCA proximally and distally. 4. No significant carotid or vertebral artery stenosis in the neck. 5. These results were called by telephone at the time of interpretation on 09/30/2019 at 1:50 pm to provider Lindzen, who verbally acknowledged these results. Electronically Signed   By: Marlan Palau M.D.   On: 09/30/2019 13:51   DG Chest 2 View  Result Date: 09/26/2019 CLINICAL DATA:  Upper epigastric pain, nausea, vomiting for couple weeks, unable to keep much down EXAM: CHEST - 2 VIEW COMPARISON:  None FINDINGS: Borderline enlargement of cardiac silhouette. Mediastinal contours and pulmonary vascularity normal. Atherosclerotic calcification aorta. Bronchitic changes without pulmonary infiltrate. Minimal atelectasis and pleural effusion at LEFT base. No pneumothorax. Bones demineralized. IMPRESSION: Borderline enlargement of cardiac silhouette. Bronchitic changes with minimal atelectasis and small pleural effusion at LEFT base. Electronically Signed   By: Ulyses Southward M.D.   On: 09/26/2019 20:19   DG Abd 1 View  Result Date: 10/01/2019 CLINICAL DATA:   NG tube placement. EXAM: ABDOMEN - 1 VIEW COMPARISON:  None. FINDINGS: The NG tube tip is seen in the lower chest at the T10 level, likely in the distal esophagus. Bowel gas pattern is normal. Small amount of contrast in the kidneys and bladder. No acute bone abnormality. IMPRESSION: NG tube tip in the distal esophagus. Electronically Signed   By: Francene Boyers M.D.   On: 10/01/2019 12:34   CT HEAD WO CONTRAST  Result Date: 10/01/2019 CLINICAL DATA:  Stroke follow-up. Lethargy and confusion with right temporal headache. EXAM: CT HEAD WITHOUT CONTRAST TECHNIQUE: Contiguous axial images were obtained from the base of the skull through the vertex without intravenous contrast. COMPARISON:  CT and CTA from yesterday FINDINGS: Brain: Loss of gray-white differentiation centered in the right parietal lobe correlating with the perfusion defect on prior. No hemorrhagic conversion. There has been age-indeterminate infarction of the right caudate head, stable from yesterday. No hydrocephalus or masslike finding Vascular: No hyperdense vessel. Skull: Normal. Negative for fracture or focal lesion. Sinuses/Orbits: No acute finding Other: Motion degraded IMPRESSION: 1. Acute infarct centered in the right parietal lobe correlating with perfusion defects yesterday. 2. Age-indeterminate infarct at the right caudate that is stable from yesterday. 3. Motion degraded Electronically Signed   By: Marnee Spring M.D.   On: 10/01/2019 06:56   CT HEAD WO CONTRAST  Result Date: 09/30/2019 CLINICAL DATA:  Change in mental status with left arm weakness EXAM: CT HEAD WITHOUT CONTRAST TECHNIQUE: Contiguous axial images were obtained from the base of the skull through the vertex without intravenous contrast. COMPARISON:  Head CT from yesterday FINDINGS: Brain: Low-density in the right caudate head and anterior body, also involving the upper right putamen. There is also indistinct appearance to the left putamen. Low-density in the right  pons with fairly defined and flat appearance on reformats. No visible cortex infarct. No hemorrhage, hydrocephalus, or masslike finding. Vascular: No hyperdense vessel Skull: Negative Sinuses/Orbits: Anterior septal defect without nodularity. Other: These results will be called to the ordering clinician or representative by the Radiologist Assistant, and communication documented in the PACS or [employer]. IMPRESSION: 1. Right basal ganglia infarct, likely recent given the history. 2. Ischemic change also seen at the left putamen, age indeterminate by imaging. 3. Small remote appearing right pontine infarct. Electronically Signed   By: Marnee Spring M.D.   On: 09/30/2019 04:59   CT HEAD WO CONTRAST  Result Date: 09/29/2019 CLINICAL DATA:  Altered mental status EXAM: CT HEAD WITHOUT CONTRAST TECHNIQUE: Contiguous axial images were obtained from the base of the skull through the vertex without intravenous contrast. COMPARISON:  None. FINDINGS: Brain: No evidence of acute infarction, hemorrhage, extra-axial collection, ventriculomegaly, or mass effect. Generalized cerebral atrophy. Periventricular white matter low attenuation likely secondary to microangiopathy. Vascular: Cerebrovascular atherosclerotic calcifications are noted. Skull: Negative for fracture or focal lesion. Sinuses/Orbits: Visualized portions of the orbits are unremarkable. Visualized portions of the paranasal sinuses are unremarkable. Visualized portions of the mastoid air cells are unremarkable. Other: None. IMPRESSION: 1. No acute intracranial  pathology. 2. Chronic microvascular disease and cerebral atrophy. Electronically Signed   By: Elige Ko   On: 09/29/2019 10:45   CT ANGIO NECK W OR WO CONTRAST  Result Date: 09/30/2019 CLINICAL DATA:  Stroke. Left facial droop and left arm weakness. Slurred speech. EXAM: CT ANGIOGRAPHY HEAD AND NECK CT PERFUSION BRAIN TECHNIQUE: Multidetector CT imaging of the head and neck was performed  using the standard protocol during bolus administration of intravenous contrast. Multiplanar CT image reconstructions and MIPs were obtained to evaluate the vascular anatomy. Carotid stenosis measurements (when applicable) are obtained utilizing NASCET criteria, using the distal internal carotid diameter as the denominator. Multiphase CT imaging of the brain was performed following IV bolus contrast injection. Subsequent parametric perfusion maps were calculated using RAPID software. CONTRAST:  OMNIPAQUE IOHEXOL 350 MG/ML SOLN COMPARISON:  CT head 09/30/2019 FINDINGS: CTA NECK FINDINGS Aortic arch: Atherosclerotic aortic arch. Three-vessel arch. Proximal great vessels widely patent. Right carotid system: Atherosclerotic calcification distal right common carotid artery without significant stenosis. Atherosclerotic disease right carotid bifurcation without significant stenosis. Tortuous right internal carotid artery. Left carotid system: Mild atherosclerotic disease left common carotid artery without significant stenosis. Left carotid bifurcation widely patent without stenosis. Vertebral arteries: Left vertebral artery dominant. Both vertebral arteries widely patent to the basilar without stenosis. Skeleton: No acute skeletal abnormality. Cervical spine degenerative changes relatively mild. Other neck: Negative for mass or adenopathy. 1 cm right thyroid nodule. No further imaging necessary. Upper chest: Lung apices clear bilaterally. Small left pleural effusion. Review of the MIP images confirms the above findings CTA HEAD FINDINGS Anterior circulation: Cavernous carotid patent bilaterally without significant stenosis. Moderate stenosis distal right M1 segment. Moderate to severe focal stenosis inferior division of right middle cerebral artery. This corresponds to the area of decreased perfusion on the perfusion study. No vessel occlusion or thrombus identified. Both anterior cerebral arteries patent without  stenosis. Left MCA patent without significant stenosis. Posterior circulation: Both vertebral arteries patent to the basilar. PICA patent bilaterally. Basilar widely patent. Superior cerebellar and posterior cerebral arteries patent bilaterally. Mild to moderate stenosis proximal right posterior cerebral artery. Moderate stenosis distal right posterior cerebral artery. Fetal origin of the left posterior cerebral artery. Venous sinuses: Normal venous enhancement. Anatomic variants: None Review of the MIP images confirms the above findings CT Brain Perfusion Findings: ASPECTS: 9 CBF (<30%) Volume: 0mL Perfusion (Tmax>6.0s) volume: 29mL Mismatch Volume: 29mL Infarction Location:Right parietal lobe. IMPRESSION: 1. 29 mL of delayed perfusion right parietal lobe. No fixed infarct on CT perfusion. 2. Moderate stenosis distal right M1 segment. Moderate to severe stenosis inferior branch of right middle cerebral artery. No large vessel occlusion. 3. Moderate stenosis right PCA proximally and distally. 4. No significant carotid or vertebral artery stenosis in the neck. 5. These results were called by telephone at the time of interpretation on 09/30/2019 at 1:50 pm to provider Lindzen, who verbally acknowledged these results. Electronically Signed   By: Marlan Palau M.D.   On: 09/30/2019 13:51   CT CHEST WO CONTRAST  Result Date: 09/28/2019 CLINICAL DATA:  Thoracic aortic disease. Pre operative planning for CABG. EXAM: CT CHEST, ABDOMEN AND PELVIS WITHOUT CONTRAST TECHNIQUE: Multidetector CT imaging of the chest, abdomen and pelvis was performed following the standard protocol without IV contrast. COMPARISON:  CT scan of the abdomen dated 09/26/2019 FINDINGS: CT CHEST FINDINGS Cardiovascular: Aortic atherosclerosis without dilatation of the thoracic aorta. Coronary artery calcifications. Heart is at the upper limits of normal in size. Small pericardial effusion. Mediastinum/Nodes: 9  mm nodule in the right lobe of the  thyroid gland. Trachea is normal. Esophagus is normal. Lungs/Pleura: Small bilateral pleural effusions minimal secondary atelectasis at the bases. Lungs are otherwise clear. Musculoskeletal: No chest wall mass or suspicious bone lesions identified. CT ABDOMEN PELVIS FINDINGS Hepatobiliary: Slight hepatic steatosis. Cholecystectomy. No biliary ductal dilatation. Pancreas: Normal. Spleen: Normal. Adrenals/Urinary Tract: Adrenal glands are normal. There are persistent nephrograms bilaterally without hydronephrosis. Tiny area of linear enhancement in the medial aspect of the upper pole is not felt to be significant. Stomach/Bowel: Stomach is within normal limits. Appendix appears normal. No evidence of bowel wall thickening, distention, or inflammatory changes. There are a few diverticula in the left side of the colon. No diverticulitis. Vascular/Lymphatic: Aortic atherosclerosis. No enlarged abdominal or pelvic lymph nodes. Reproductive: Uterus and ovaries appear normal. Patient's history reports abdominal hysterectomy but that does not appear to be accurate. Other: No abdominal wall hernia or abnormality. No abdominopelvic ascites. Musculoskeletal: There is an incompletely visualized 5 cm intramuscular hematoma in the right rectus femoris muscle. There is slight hemorrhage in the adjacent adductor longus muscle without a defined hematoma. IMPRESSION: 1. Aortic atherosclerosis.  No aneurysmal dilatation or dissection. 2. Incompletely visualized 5 cm intramuscular hematoma in the right rectus femoris muscle. Slight hemorrhage in the adjacent adductor longus muscle without a defined hematoma. 3. Persistent nephrograms bilaterally which could represent acute tubular necrosis. 4. Small bilateral pleural effusions with minimal secondary atelectasis at the bases. 5. Slight hepatic steatosis. 6. 9 mm nodule in the right lobe of the thyroid gland. No follow-up recommended. This recommendation follows ACR consensus guidelines:  Managing Incidental Thyroid Nodules Detected on Imaging: White Paper of the ACR Incidental Thyroid Findings Committee. J Am Coll Radiol 2015; 12:143-150. Aortic Atherosclerosis (ICD10-I70.0). Electronically Signed   By: Francene Boyers M.D.   On: 09/28/2019 17:01   MR BRAIN WO CONTRAST  Addendum Date: 10/09/2019   ADDENDUM REPORT: 10/09/2019 17:24 ADDENDUM: Study discussed by telephone with Dr. Marvel Plan on 10/09/2019 at 1715 hours. We discussed the heterogeneous appearance of the brainstem and cerebellum on axial DWI. But I favor that is artifact, with no convincing infratentorial diffusion abnormality on the coronal images. However, both sets of images do suggest a small area of susceptibility in the central lower pons likely from chronic microhemorrhage (series 3, image 18 and series 4, image 19). Electronically Signed   By: Odessa Fleming M.D.   On: 10/09/2019 17:24   Result Date: 10/09/2019 CLINICAL DATA:  69 year old female, code stroke presentation on 09/30/2019. Posterior right hemisphere cytotoxic edema evident by CT the next day. EXAM: MRI HEAD WITHOUT CONTRAST TECHNIQUE: Multiplanar, multiecho pulse sequences of the brain and surrounding structures were obtained without intravenous contrast. COMPARISON:  CT head 10/01/2019, CTA and CTP 09/30/2019 FINDINGS: The examination had to be discontinued prior to completion due to patient inability to tolerate. Axial and coronal DWI imaging only was obtained. There is a large 6 cm area of confluent restricted diffusion in the right parietal lobe, with contiguous additional restricted diffusion in the deep right MCA territory white matter (series 3, image 38). Much of this corresponds to abnormal perfusion T-max on the comparison. Additionally there is confluent diffusion restriction in the splenium of the corpus callosum on series 3, image 32. There is also abnormal diffusion in the contralateral left MCA periventricular white matter (also on image 38) although  affecting much smaller area. Furthermore, there is probably artifactual signal in the occipital lobes on series 3, which is not correlated  on coronal diffusion series 4 (series 3, image 28 versus series 4, image 9). No midline shift or significant intracranial mass effect. Basilar cisterns appear patent. No definite ventriculomegaly. IMPRESSION: 1. Exam limited to DWI secondary to patient inability to tolerate. 2. Large infarct in the posterior Right MCA territory, including confluent ischemia in the deep Right MCA white matter, and the splenium of the corpus callosum. Aside from the splenium, these areas were abnormal on CTP last week. 3. But there is also evidence of patchy infarcts in the deep Left MCA white matter, not evident last week. 4. No significant intracranial mass effect. Electronically Signed: By: Odessa Fleming M.D. On: 10/09/2019 16:08   CT ABDOMEN PELVIS W CONTRAST  Result Date: 09/26/2019 CLINICAL DATA:  69 year old female with nausea vomiting. Concern for bowel obstruction. EXAM: CT ABDOMEN AND PELVIS WITH CONTRAST TECHNIQUE: Multidetector CT imaging of the abdomen and pelvis was performed using the standard protocol following bolus administration of intravenous contrast. CONTRAST:  OMNIPAQUE IOHEXOL 300 MG/ML  SOLN COMPARISON:  None. FINDINGS: Lower chest: Partially visualized small bilateral pleural effusions with associated partial compressive atelectasis the lower lobes. Pneumonia is not excluded. Clinical correlation is recommended. There is mild cardiomegaly. No intra-abdominal free air or free fluid. Hepatobiliary: Fatty infiltration of the liver with findings of early cirrhosis. No intrahepatic biliary ductal dilatation. Cholecystectomy. No retained calcified stone noted the central CBD. Pancreas: Unremarkable. No pancreatic ductal dilatation or surrounding inflammatory changes. Spleen: Normal in size without focal abnormality. Adrenals/Urinary Tract: The adrenal glands are  unremarkable. There is no hydronephrosis on either side. There is symmetric enhancement and excretion of contrast by both kidneys. The visualized ureters and urinary bladder appear unremarkable. Stomach/Bowel: There is no bowel obstruction or active inflammation. The appendix is normal. Vascular/Lymphatic: Moderate aortoiliac atherosclerotic disease. IVC is unremarkable. No portal venous gas. Mildly enlarged lymph node in the porta hepatic measuring 14 mm. Reproductive: The uterus and ovaries are grossly unremarkable. Other: None Musculoskeletal: Osteopenia with degenerative changes of the spine. There is a transitional anatomy with grade 1 anterolisthesis. No acute osseous pathology. IMPRESSION: 1. No acute intra-abdominal or pelvic pathology. No bowel obstruction. Normal appendix. 2. Fatty liver with findings of early cirrhosis. 3. Partially visualized small bilateral pleural effusions and partial compressive atelectasis of the lower lobes. Pneumonia is not excluded. Clinical correlation is recommended. 4. Aortic Atherosclerosis (ICD10-I70.0). Electronically Signed   By: Elgie Collard M.D.   On: 09/26/2019 19:21   CARDIAC CATHETERIZATION  Result Date: 09/30/2019  Proximal LAD is 90% stenosed  A drug-eluting stent was successfully placed using a STENT RESOLUTE ONYX 3.0X12.  Post intervention, there is a 0% residual stenosis.  Mid Cx lesion is 95% stenosed.  A drug-eluting stent was successfully placed using a STENT RESOLUTE ONYX 2.5X26.  Post intervention, there is a 0% residual stenosis.  1. Successful PCI of the proximal LAD with DES x 1 2. Successful PCI of the mid LCx with DES x 1. Plan: DAPT for one year. Will continue IV Cangrelor until NG in place and patient can be loaded with oral Plavix.   CARDIAC CATHETERIZATION  Result Date: 09/27/2019  The left ventricular ejection fraction is less than 25% by visual estimate.  LV end diastolic pressure is severely elevated.  There is severe left  ventricular systolic dysfunction.  RPDA lesion is 90% stenosed.  Ramus lesion is 90% stenosed.  3rd Mrg lesion is 90% stenosed.  Dist LAD-1 lesion is 80% stenosed.  Dist LAD-2 lesion is 50% stenosed.  Prox LAD lesion is 90% stenosed.    CT CEREBRAL PERFUSION W CONTRAST  Result Date: 09/30/2019 CLINICAL DATA:  Stroke. Left facial droop and left arm weakness. Slurred speech. EXAM: CT ANGIOGRAPHY HEAD AND NECK CT PERFUSION BRAIN TECHNIQUE: Multidetector CT imaging of the head and neck was performed using the standard protocol during bolus administration of intravenous contrast. Multiplanar CT image reconstructions and MIPs were obtained to evaluate the vascular anatomy. Carotid stenosis measurements (when applicable) are obtained utilizing NASCET criteria, using the distal internal carotid diameter as the denominator. Multiphase CT imaging of the brain was performed following IV bolus contrast injection. Subsequent parametric perfusion maps were calculated using RAPID software. CONTRAST:  OMNIPAQUE IOHEXOL 350 MG/ML SOLN COMPARISON:  CT head 09/30/2019 FINDINGS: CTA NECK FINDINGS Aortic arch: Atherosclerotic aortic arch. Three-vessel arch. Proximal great vessels widely patent. Right carotid system: Atherosclerotic calcification distal right common carotid artery without significant stenosis. Atherosclerotic disease right carotid bifurcation without significant stenosis. Tortuous right internal carotid artery. Left carotid system: Mild atherosclerotic disease left common carotid artery without significant stenosis. Left carotid bifurcation widely patent without stenosis. Vertebral arteries: Left vertebral artery dominant. Both vertebral arteries widely patent to the basilar without stenosis. Skeleton: No acute skeletal abnormality. Cervical spine degenerative changes relatively mild. Other neck: Negative for mass or adenopathy. 1 cm right thyroid nodule. No further imaging necessary. Upper chest: Lung  apices clear bilaterally. Small left pleural effusion. Review of the MIP images confirms the above findings CTA HEAD FINDINGS Anterior circulation: Cavernous carotid patent bilaterally without significant stenosis. Moderate stenosis distal right M1 segment. Moderate to severe focal stenosis inferior division of right middle cerebral artery. This corresponds to the area of decreased perfusion on the perfusion study. No vessel occlusion or thrombus identified. Both anterior cerebral arteries patent without stenosis. Left MCA patent without significant stenosis. Posterior circulation: Both vertebral arteries patent to the basilar. PICA patent bilaterally. Basilar widely patent. Superior cerebellar and posterior cerebral arteries patent bilaterally. Mild to moderate stenosis proximal right posterior cerebral artery. Moderate stenosis distal right posterior cerebral artery. Fetal origin of the left posterior cerebral artery. Venous sinuses: Normal venous enhancement. Anatomic variants: None Review of the MIP images confirms the above findings CT Brain Perfusion Findings: ASPECTS: 9 CBF (<30%) Volume: 0mL Perfusion (Tmax>6.0s) volume: 29mL Mismatch Volume: 29mL Infarction Location:Right parietal lobe. IMPRESSION: 1. 29 mL of delayed perfusion right parietal lobe. No fixed infarct on CT perfusion. 2. Moderate stenosis distal right M1 segment. Moderate to severe stenosis inferior branch of right middle cerebral artery. No large vessel occlusion. 3. Moderate stenosis right PCA proximally and distally. 4. No significant carotid or vertebral artery stenosis in the neck. 5. These results were called by telephone at the time of interpretation on 09/30/2019 at 1:50 pm to provider Lindzen, who verbally acknowledged these results. Electronically Signed   By: Marlan Palau M.D.   On: 09/30/2019 13:51   DG Chest Port 1 View  Result Date: 10/08/2019 CLINICAL DATA:  Pain EXAM: PORTABLE CHEST 1 VIEW COMPARISON:  September 26, 2019  FINDINGS: Lungs are clear. Heart is upper normal in size with pulmonary vascularity normal. No adenopathy. No pneumothorax. There is aortic atherosclerosis. There is degenerative change in the thoracic spine. IMPRESSION: Lungs clear. Heart upper normal in size. Aortic Atherosclerosis (ICD10-I70.0). Electronically Signed   By: Bretta Bang III M.D.   On: 10/08/2019 13:26   DG Abd Portable 1V  Result Date: 10/08/2019 CLINICAL DATA:  Abdominal pain EXAM: PORTABLE ABDOMEN - 1 VIEW COMPARISON:  October 01, 2019 FINDINGS: Mild stool in colon. No bowel dilatation or air-fluid level to suggest bowel obstruction. No free air evident on supine examination. Visualized lung bases clear. Surgical clips noted in right upper quadrant. IMPRESSION: No bowel obstruction or free air evident on supine examination. Electronically Signed   By: Bretta Bang III M.D.   On: 10/08/2019 13:26   DG Swallowing Func-Speech Pathology  Result Date: 10/05/2019 Objective Swallowing Evaluation: Type of Study: MBS-Modified Barium Swallow Study  Patient Details Name: Khloey Chern MRN: [id number] Date of Birth: 1951-04-13 Today's Date: 10/05/2019 Time: SLP Start Time (ACUTE ONLY): 1115 -SLP Stop Time (ACUTE ONLY): 1145 SLP Time Calculation (min) (ACUTE ONLY): 30 min Past Medical History: Past Medical History: Diagnosis Date . CAD (coronary artery disease)   a. 09/2019 NSTEMI/Cath: LM nl, LAD 90p, 35m, diffuse mid-dist dzs, LCX 54m AV groove/OM2, RI 90p, RCA dominant, nl, RPDA 23m, EF 25%, glob HK. Marland Kitchen HFrEF (heart failure with reduced ejection fraction) (HCC)   a. 09/2019 LV gram: EF 25%, global HK. Marland Kitchen PUD (peptic ulcer disease)  . Seasonal allergies  Past Surgical History: Past Surgical History: Procedure Laterality Date . CHOLECYSTECTOMY   . CORONARY STENT INTERVENTION N/A 09/30/2019  Procedure: CORONARY STENT INTERVENTION;  Surgeon: Swaziland, Peter M, MD;  Location: [facility] INVASIVE CV LAB;  Service: Cardiovascular;  Laterality: N/A; . LEFT HEART CATH  AND CORONARY ANGIOGRAPHY N/A 09/30/2019  Procedure: LEFT HEART CATH AND CORONARY ANGIOGRAPHY;  Surgeon: Swaziland, Peter M, MD;  Location: [facility] INVASIVE CV LAB;  Service: Cardiovascular;  Laterality: N/A; . LEFT HEART CATH AND CORS/GRAFTS ANGIOGRAPHY N/A 09/27/2019  Procedure: LEFT HEART CATH AND CORS/GRAFTS ANGIOGRAPHY;  Surgeon: Antonieta Iba, MD;  Location: [facility] INVASIVE CV LAB;  Service: Cardiovascular;  Laterality: N/A; HPI: 69 y/o female with hx of CAD, cholecystectomy, presented to ED with nausea/vomiting, epigastric pain for 1 week intermittently.  Admitted for NSTEMI, cath 3/26 revealed severe multivessel CAD and transferred to [facility].  Fall 3/27 with R groin hematoma.  Found AMS 3/28 due to pain meds.  Rapid response 3/29 due to AMS and L facial droop/weakness. CT revealed right basal ganglia infarct.  3/29 pm with worsening deficits; STAT CT head with right parietal infarct.  Subjective: frequently needed cues to keep head still during exam Assessment / Plan / Recommendation CHL IP CLINICAL IMPRESSIONS 10/05/2019 Clinical Impression Patient presents with a primary sensory-based dysphagia with swallow initiation delays to level of vallecular sinus with honey thick liquids and puree solids and delays to the pyriform sinus with thin liquids and nectar thick liquids. She had silent aspiration or trace-minimal amount before swallow and during swallow. She had flash penetration with nectar thick liquids, thin liquids and one time with honey thick liquids, but will full clearance from laryngeal vestibule with nectar and honey thick. Patient did exhibit two instances of cough response after aspiration had already occured but this was not effecitve to transit aspirate out. No significant amount of phraryngeal residuals were observed s/p  swallows.  SLP Visit Diagnosis Dysphagia, oropharyngeal phase (R13.12) Attention and concentration deficit following -- Frontal lobe and executive function deficit following -- Impact on  safety and function Moderate aspiration risk;Mild aspiration risk   CHL IP TREATMENT RECOMMENDATION 10/05/2019 Treatment Recommendations Therapy as outlined in treatment plan below   Prognosis 10/05/2019 Prognosis for Safe Diet Advancement Good Barriers to Reach Goals Severity of deficits Barriers/Prognosis Comment -- CHL IP DIET RECOMMENDATION 10/05/2019 SLP Diet Recommendations Nectar thick liquid;Ice chips PRN after oral care;Free water protocol  after oral care;Dysphagia 1 (Puree) solids Liquid Administration via Cup;Spoon Medication Administration Whole meds with puree Compensations Minimize environmental distractions;Slow rate;Small sips/bites Postural Changes Seated upright at 90 degrees   CHL IP OTHER RECOMMENDATIONS 10/05/2019 Recommended Consults -- Oral Care Recommendations Oral care BID;Staff/trained caregiver to provide oral care Other Recommendations Order thickener from pharmacy;Prohibited food (jello, ice cream, thin soups);Remove water pitcher;Have oral suction available;Clarify dietary restrictions   CHL IP FOLLOW UP RECOMMENDATIONS 10/05/2019 Follow up Recommendations 24 hour supervision/assistance;Other (comment)   CHL IP FREQUENCY AND DURATION 10/05/2019 Speech Therapy Frequency (ACUTE ONLY) min 3x week Treatment Duration 2 weeks      CHL IP ORAL PHASE 10/05/2019 Oral Phase Impaired Oral - Pudding Teaspoon -- Oral - Pudding Cup -- Oral - Honey Teaspoon -- Oral - Honey Cup -- Oral - Nectar Teaspoon -- Oral - Nectar Cup -- Oral - Nectar Straw -- Oral - Thin Teaspoon -- Oral - Thin Cup -- Oral - Thin Straw -- Oral - Puree Reduced posterior propulsion;Delayed oral transit Oral - Mech Soft -- Oral - Regular -- Oral - Multi-Consistency -- Oral - Pill Reduced posterior propulsion;Decreased bolus cohesion;Delayed oral transit;Other (Comment) Oral Phase - Comment --  CHL IP PHARYNGEAL PHASE 10/05/2019 Pharyngeal Phase Impaired Pharyngeal- Pudding Teaspoon -- Pharyngeal -- Pharyngeal- Pudding Cup -- Pharyngeal --  Pharyngeal- Honey Teaspoon Delayed swallow initiation-vallecula Pharyngeal -- Pharyngeal- Honey Cup Delayed swallow initiation-vallecula;Penetration/Aspiration during swallow Pharyngeal Material enters airway, remains ABOVE vocal cords then ejected out Pharyngeal- Nectar Teaspoon Delayed swallow initiation-pyriform sinuses;Delayed swallow initiation-vallecula Pharyngeal Material does not enter airway Pharyngeal- Nectar Cup Delayed swallow initiation-vallecula;Delayed swallow initiation-pyriform sinuses;Penetration/Aspiration before swallow;Penetration/Aspiration during swallow Pharyngeal Material enters airway, remains ABOVE vocal cords then ejected out Pharyngeal- Nectar Straw NT Pharyngeal -- Pharyngeal- Thin Teaspoon Delayed swallow initiation-pyriform sinuses;Delayed swallow initiation-vallecula;Penetration/Aspiration before swallow Pharyngeal Material enters airway, remains ABOVE vocal cords then ejected out Pharyngeal- Thin Cup Delayed swallow initiation-vallecula;Delayed swallow initiation-pyriform sinuses;Penetration/Aspiration before swallow;Penetration/Aspiration during swallow;Trace aspiration Pharyngeal Material enters airway, passes BELOW cords without attempt by patient to eject out (silent aspiration) Pharyngeal- Thin Straw Delayed swallow initiation-pyriform sinuses;Penetration/Aspiration before swallow;Trace aspiration Pharyngeal Material enters airway, passes BELOW cords and not ejected out despite cough attempt by patient Pharyngeal- Puree Delayed swallow initiation-vallecula Pharyngeal -- Pharyngeal- Mechanical Soft -- Pharyngeal -- Pharyngeal- Regular -- Pharyngeal -- Pharyngeal- Multi-consistency -- Pharyngeal -- Pharyngeal- Pill Delayed swallow initiation-vallecula Pharyngeal -- Pharyngeal Comment --  CHL IP CERVICAL ESOPHAGEAL PHASE 10/05/2019 Cervical Esophageal Phase WFL Pudding Teaspoon -- Pudding Cup -- Honey Teaspoon -- Honey Cup -- Nectar Teaspoon -- Nectar Cup -- Nectar Straw -- Thin  Teaspoon -- Thin Cup -- Thin Straw -- Puree -- Mechanical Soft -- Regular -- Multi-consistency -- Pill -- Cervical Esophageal Comment -- Pablo Lawrence 10/05/2019, 2:41 PM Angela Nevin, MA, CCC-SLP Speech Therapy [facility] Pager: [phone]             MR CARDIAC MORPHOLOGY W WO CONTRAST  Result Date: 09/30/2019 CLINICAL DATA:  Patient hx of CAD, HFrEF. Cardiac MR performed to evaluate viability EXAM: CARDIAC MRI TECHNIQUE: The patient was scanned on a 1.5 Tesla GE magnet. A dedicated cardiac coil was used. Functional imaging was done using Fiesta sequences. 2,3, and 4 chamber views were done to assess for RWMA's. Modified Simpson's rule using a short axis stack was used to calculate an ejection fraction on a dedicated work [employer]. The patient received cc of Gadavist. After 10 minutes inversion recovery sequences were used to assess for infiltration and  scar tissue. Due to uncooperative patient, free breathing techniques implemented. CONTRAST:  7 cc  of Gadavist FINDINGS: 1. Normal left ventricular size and thickness. The systolic function is severely reduced (LVEF =29%). There is global hypokinesis. There is no late gadolinium enhancement in the left ventricular myocardium. LVEDV:171 ml LVESV: 121 ml SV: 50 ml CO: 3.98L/min Myocardial mass: 180g 2. Normal right ventricular size and thickness. RV systolic function is mild to moderately reduced (RVEF =37%). There is global hypokinesis. 3.  Left atrium is mildly dilated, normal right atrial size. 4. Normal size of the aortic root, ascending aorta and pulmonary artery. 5.  No significant valvular abnormalities. 6.  Normal pericardium. Mild circumferencial pericardial effusion. IMPRESSION: 1. Severely reduced LV systolic function, global hypokinesis, LVEF of 29% 2. No evidence of Scar/late gadolinium enhancement. Entire LV myocardium appears viable 3.  Mild to moderately reduced RVEF 4.  Mild circuferential pericardial effusion 5.   Normal LV and RV chamber sizes and thickness 6. Due to uncooperative patient, free breathing techniques implemented which hinders image quality. Electronically Signed   By: Debbe Odea M.D.   On: 09/30/2019 12:33   EEG adult  Result Date: 10/01/2019 Charlsie Quest, MD     10/01/2019  2:33 PM Patient Name: Solei Wubben MRN: [id number] Epilepsy Attending: Charlsie Quest Referring Physician/Provider: Annie Main, NP Date: 10/01/2019 Duration: 24.40 minutes Patient history: 69 year old female presented with acute left-sided weakness and facial droop.  CT head showed right parietal lobe infarct.  EEG to evaluate for seizures. Level of alertness: awake AEDs during EEG study: None Technical aspects: This EEG study was done with scalp electrodes positioned according to the 10-20 International system of electrode placement. Electrical activity was acquired at a sampling rate of  and reviewed with a high frequency filter of  and a low frequency filter of . EEG data were recorded continuously and digitally stored. Description: During awake state, no clear posterior dominant rhythm was seen.  EEG showed continuous 2 to 3 Hz amplitude delta slowing in the right hemisphere as well as 3 to 6 Hz theta-delta slowing in left hemisphere.  Hyperventilation and photic stimulation were not performed. Abnormality -Continuous slow, generalized and lateralized right hemisphere IMPRESSION: This study is suggestive of cortical dysfunction in the right hemisphere likely secondary to underlying infarct as well as moderate to severe diffuse encephalopathy, nonspecific to etiology. No seizures or epileptiform discharges were seen throughout the recording. Priyanka O Yadav   VAS [place] GROIN PSEUDOANEURYSM  Result Date: 09/29/2019  ARTERIAL PSEUDOANEURYSM  Exam: Right groin Indications: Patient complains of palpable knot and bruising. History: S/p catheterization. Comparison Study: No prior study Performing Technologist:  Sherren Kerns RVS  Examination Guidelines: A complete evaluation includes B-mode imaging, spectral Doppler, color Doppler, and power Doppler as needed of all accessible portions of each vessel. Bilateral testing is considered an integral part of a complete examination. Limited examinations for reoccurring indications may be performed as noted.  Summary: No evidence of pseudoaneurysm, AVF or DVT Large hematoma noted in the proximal thigh measuring 4.46 X 4.65. No evidence of pseudoaneurysm or AV Fistula. Diagnosing physician: Waverly Ferrari MD Electronically signed by Waverly Ferrari MD on 09/29/2019 at 6:36:42 PM.   --------------------------------------------------------------------------------    Final    VAS [place] ABI WITH/WO TBI  Result Date: 10/07/2019 LOWER EXTREMITY DOPPLER STUDY Indications: Blue toes on left. High Risk Factors: Coronary artery disease.  Comparison Study: No prior study Performing Technologist: Gertie Fey MHA, RVT, RDCS, RDMS  Examination Guidelines: A complete  evaluation includes at minimum, Doppler waveform signals and systolic blood pressure reading at the level of bilateral brachial, anterior tibial, and posterior tibial arteries, when vessel segments are accessible. Bilateral testing is considered an integral part of a complete examination. Photoelectric Plethysmograph (PPG) waveforms and toe systolic pressure readings are included as required and additional duplex testing as needed. Limited examinations for reoccurring indications may be performed as noted.  ABI Findings: +---------+------------------+-----+----------+--------+ Right    Rt Pressure (mmHg)IndexWaveform  Comment  +---------+------------------+-----+----------+--------+ Brachial 143                    monophasic         +---------+------------------+-----+----------+--------+ PTA      151               0.97 triphasic          +---------+------------------+-----+----------+--------+ DP        133               0.86 triphasic          +---------+------------------+-----+----------+--------+ Great Toe138               0.89                    +---------+------------------+-----+----------+--------+ +---------+------------------+-----+----------+-------+ Left     Lt Pressure (mmHg)IndexWaveform  Comment +---------+------------------+-----+----------+-------+ Brachial 155                    triphasic         +---------+------------------+-----+----------+-------+ PTA      119               0.77 monophasic        +---------+------------------+-----+----------+-------+ DP       127               0.82 monophasic        +---------+------------------+-----+----------+-------+ Great Toe69                0.45                   +---------+------------------+-----+----------+-------+ +-------+-----------+-----------+------------+------------+ ABI/TBIToday's ABIToday's TBIPrevious ABIPrevious TBI +-------+-----------+-----------+------------+------------+ Right  0.97       0.89                                +-------+-----------+-----------+------------+------------+ Left   0.82       0.45                                +-------+-----------+-----------+------------+------------+  Summary: Right: Resting right ankle-brachial index is within normal range. No evidence of significant right lower extremity arterial disease. The right toe-brachial index is normal. Left: Resting left ankle-brachial index indicates mild left lower extremity arterial disease. The left toe-brachial index is abnormal.  *See table(s) above for measurements and observations.  Electronically signed by Monica Martinez MD on 10/07/2019 at 5:32:13 PM.    Final    ECHOCARDIOGRAM COMPLETE  Result Date: 10/02/2019    ECHOCARDIOGRAM REPORT   Patient Name:   DELVINA MIZZELL Date of Exam: 10/02/2019 Medical Rec #:  [id number]     Height:       62.0 in Accession #:    [id number]    Weight:        151.2 lb Date of Birth:  09-Nov-1950     BSA:  1.698 m Patient Age:    69 years      BP:           158/62 mmHg Patient Gender: F             HR:           88 bpm. Exam Location:  Inpatient Procedure: 2D Echo, Cardiac Doppler, Color Doppler and Strain Analysis Indications:    I50.40* Unspecified combined systolic (congestive) and diastolic                 (congestive) heart failure  History:        Patient has prior history of Echocardiogram examinations, most                 recent 09/27/2019. CHF, Acute MI and CAD, Abnormal ECG; Stroke.                 Post cardiac cath.  Sonographer:    Sheralyn Boatman RDCS Referring Phys: [phone] AMY D CLEGG IMPRESSIONS  1. Left ventricular ejection fraction, by estimation, is 50 to 55%. The left ventricle has low normal function. The left ventricle demonstrates regional wall motion abnormalities (see scoring diagram/findings for description). There is mild concentric left ventricular hypertrophy. Left ventricular diastolic parameters are indeterminate. Elevated left ventricular end-diastolic pressure. There is mild hypokinesis of the left ventricular, entire anteroseptal wall. There is mild hypokinesis of the left ventricular, mid-apical anterior wall.  2. Right ventricular systolic function is normal. The right ventricular size is normal. Tricuspid regurgitation signal is inadequate for assessing PA pressure.  3. The mitral valve is normal in structure. Trivial mitral valve regurgitation. No evidence of mitral stenosis.  4. The aortic valve is tricuspid. Aortic valve regurgitation is not visualized. No aortic stenosis is present. Comparison(s): Changes from prior study are noted. Conclusion(s)/Recommendation(s): LVEF significantly improved, with only trivial hypokinesis remaining in anterior/anteroseptal walls. FINDINGS  Left Ventricle: Left ventricular ejection fraction, by estimation, is 50 to 55%. The left ventricle has low normal function. The left ventricle demonstrates  regional wall motion abnormalities. Mild hypokinesis of the left ventricular, entire anteroseptal wall. Mild hypokinesis of the left ventricular, mid-apical anterior wall. The left ventricular internal cavity size was normal in size. There is mild concentric left ventricular hypertrophy. Left ventricular diastolic parameters are indeterminate. Elevated left ventricular end-diastolic pressure. Right Ventricle: The right ventricular size is normal. No increase in right ventricular wall thickness. Right ventricular systolic function is normal. Tricuspid regurgitation signal is inadequate for assessing PA pressure. Left Atrium: Left atrial size was normal in size. Right Atrium: Right atrial size was normal in size. Pericardium: There is no evidence of pericardial effusion. Mitral Valve: The mitral valve is normal in structure. Trivial mitral valve regurgitation. No evidence of mitral valve stenosis. Tricuspid Valve: The tricuspid valve is normal in structure. Tricuspid valve regurgitation is trivial. No evidence of tricuspid stenosis. Aortic Valve: The aortic valve is tricuspid. Aortic valve regurgitation is not visualized. No aortic stenosis is present. Pulmonic Valve: The pulmonic valve was not well visualized. Pulmonic valve regurgitation is trivial. No evidence of pulmonic stenosis. Aorta: The aortic root and ascending aorta are structurally normal, with no evidence of dilitation. Venous: The inferior vena cava was not well visualized. IAS/Shunts: The atrial septum is grossly normal.  LEFT VENTRICLE PLAX 2D LVIDd:         4.00 cm      Diastology LVIDs:         2.90 cm  LV e' lateral:   5.66 cm/s LV PW:         1.40 cm      LV E/e' lateral: 18.9 LV IVS:        1.50 cm      LV e' medial:    4.35 cm/s LVOT diam:     1.90 cm      LV E/e' medial:  24.6 LV SV:         70 LV SV Index:   41 LVOT Area:     2.84 cm  LV Volumes (MOD) LV vol d, MOD A2C: 101.0 ml LV vol d, MOD A4C: 93.9 ml LV vol s, MOD A2C: 40.4 ml LV vol  s, MOD A4C: 50.4 ml LV SV MOD A2C:     60.6 ml LV SV MOD A4C:     93.9 ml LV SV MOD BP:      54.7 ml RIGHT VENTRICLE             IVC RV S prime:     17.20 cm/s  IVC diam: 1.60 cm TAPSE (M-mode): 2.5 cm LEFT ATRIUM             Index       RIGHT ATRIUM           Index LA diam:        2.80 cm 1.65 cm/m  RA Area:     14.70 cm LA Vol (A2C):   47.4 ml 27.92 ml/m RA Volume:   38.30 ml  22.56 ml/m LA Vol (A4C):   26.7 ml 15.73 ml/m LA Biplane Vol: 37.1 ml 21.85 ml/m  AORTIC VALVE LVOT Vmax:   153.00 cm/s LVOT Vmean:  97.200 cm/s LVOT VTI:    0.246 m  AORTA Ao Root diam: 3.20 cm Ao Asc diam:  3.20 cm MITRAL VALVE MV Area (PHT): 2.62 cm     SHUNTS MV Decel Time: 289 msec     Systemic VTI:  0.25 m MV E velocity: 107.00 cm/s  Systemic Diam: 1.90 cm MV A velocity: 148.00 cm/s MV E/A ratio:  0.72 Jodelle Red MD Electronically signed by Jodelle Red MD Signature Date/Time: 10/02/2019/6:00:57 PM    Final    ECHOCARDIOGRAM COMPLETE  Result Date: 09/27/2019    ECHOCARDIOGRAM REPORT   Patient Name:   [facility] Date of Exam: 09/27/2019 Medical Rec #:  [id number]     Height:       62.0 in Accession #:    [id number]    Weight:       163.2 lb Date of Birth:  30-Jun-1951     BSA:          1.753 m Patient Age:    68 years      BP:           140/77 mmHg Patient Gender: F             HR:           92 bpm. Exam Location:  [facility] Procedure: 2D Echo, Color Doppler and Cardiac Doppler Indications:     I21.4 NSTEMI  History:         Patient has no prior history of Echocardiogram examinations. No                  medical heart Hx.  Sonographer:     Humphrey Rolls RDCS (AE) Referring Phys:  [id number] Vernetta Honey MANSY Diagnosing Phys: Julien Nordmann MD IMPRESSIONS  1. Left ventricular  ejection fraction, by estimation, is 25 to 30%. The left ventricle has severely decreased function. The left ventricle demonstrates global hypokinesis. Left ventricular diastolic parameters are consistent with Grade II diastolic dysfunction  (pseudonormalization).  2. Right ventricular systolic function is normal. The right ventricular size is normal. Tricuspid regurgitation signal is inadequate for assessing PA pressure.  3. The inferior vena cava is dilated in size with <50% respiratory variability, suggesting right atrial pressure of 15 mmHg. FINDINGS  Left Ventricle: Left ventricular ejection fraction, by estimation, is 25 to 30%. The left ventricle has severely decreased function. The left ventricle demonstrates global hypokinesis. The left ventricular internal cavity size was normal in size. There is no left ventricular hypertrophy. Left ventricular diastolic parameters are consistent with Grade II diastolic dysfunction (pseudonormalization). Right Ventricle: The right ventricular size is normal. No increase in right ventricular wall thickness. Right ventricular systolic function is normal. Tricuspid regurgitation signal is inadequate for assessing PA pressure. Left Atrium: Left atrial size was normal in size. Right Atrium: Right atrial size was normal in size. Pericardium: There is no evidence of pericardial effusion. Mitral Valve: The mitral valve is normal in structure. Normal mobility of the mitral valve leaflets. Mild mitral valve regurgitation. No evidence of mitral valve stenosis. MV peak gradient, 5.4 mmHg. The mean mitral valve gradient is 3.0 mmHg. Tricuspid Valve: The tricuspid valve is normal in structure. Tricuspid valve regurgitation is mild . No evidence of tricuspid stenosis. Aortic Valve: The aortic valve was not well visualized. Aortic valve regurgitation is not visualized. No aortic stenosis is present. Aortic valve mean gradient measures 3.0 mmHg. Aortic valve peak gradient measures 6.7 mmHg. Aortic valve area, by VTI measures 2.15 cm. Pulmonic Valve: The pulmonic valve was normal in structure. Pulmonic valve regurgitation is not visualized. No evidence of pulmonic stenosis. Aorta: The aortic root is normal in size and  structure. Venous: The inferior vena cava is dilated in size with less than 50% respiratory variability, suggesting right atrial pressure of 15 mmHg. IAS/Shunts: No atrial level shunt detected by color flow Doppler.  LEFT VENTRICLE PLAX 2D LVIDd:         4.62 cm  Diastology LVIDs:         3.67 cm  LV e' lateral:   8.70 cm/s LV PW:         0.90 cm  LV E/e' lateral: 14.1 LV IVS:        0.73 cm  LV e' medial:    3.81 cm/s LVOT diam:     2.00 cm  LV E/e' medial:  32.3 LV SV:         41 LV SV Index:   23 LVOT Area:     3.14 cm  RIGHT VENTRICLE RV Basal diam:  3.50 cm LEFT ATRIUM             Index       RIGHT ATRIUM           Index LA diam:        3.10 cm 1.77 cm/m  RA Area:     10.70 cm LA Vol (A2C):   29.7 ml 16.94 ml/m RA Volume:   19.70 ml  11.24 ml/m LA Vol (A4C):   34.9 ml 19.90 ml/m LA Biplane Vol: 32.4 ml 18.48 ml/m  AORTIC VALVE                   PULMONIC VALVE AV Area (Vmax):    1.91 cm    PV  Vmax:       0.82 m/s AV Area (Vmean):   2.08 cm    PV Vmean:      59.000 cm/s AV Area (VTI):     2.15 cm    PV VTI:        0.130 m AV Vmax:           129.00 cm/s PV Peak grad:  2.7 mmHg AV Vmean:          77.500 cm/s PV Mean grad:  2.0 mmHg AV VTI:            0.190 m AV Peak Grad:      6.7 mmHg AV Mean Grad:      3.0 mmHg LVOT Vmax:         78.30 cm/s LVOT Vmean:        51.400 cm/s LVOT VTI:          0.130 m LVOT/AV VTI ratio: 0.68  AORTA Ao Root diam: 3.20 cm MITRAL VALVE MV Area (PHT): 4.60 cm     SHUNTS MV Peak grad:  5.4 mmHg     Systemic VTI:  0.13 m MV Mean grad:  3.0 mmHg     Systemic Diam: 2.00 cm MV Vmax:       1.16 m/s MV Vmean:      74.8 cm/s MV Decel Time: 165 msec MV E velocity: 123.00 cm/s MV A velocity: 90.70 cm/s MV E/A ratio:  1.36 Julien Nordmann MD Electronically signed by Julien Nordmann MD Signature Date/Time: 09/27/2019/8:34:19 PM    Final    VAS US DOPPLER PRE CABG  Result Date: 09/29/2019 PREOPERATIVE VASCULAR EVALUATION  Indications:      Pre-CABG. Risk Factors:     Coronary artery  disease. Other Factors:    Heart failure. Limitations:      Arrythmia Comparison Study: No prior study Performing Technologist: Sherren Kerns RVS  Examination Guidelines: A complete evaluation includes B-mode imaging, spectral Doppler, color Doppler, and power Doppler as needed of all accessible portions of each vessel. Bilateral testing is considered an integral part of a complete examination. Limited examinations for reoccurring indications may be performed as noted.  Right Carotid Findings: +----------+--------+--------+--------+------------+------------------+           PSV cm/sEDV cm/sStenosisDescribe    Comments           +----------+--------+--------+--------+------------+------------------+ CCA Prox  70      24                          intimal thickening +----------+--------+--------+--------+------------+------------------+ CCA Distal79      30                          intimal thickening +----------+--------+--------+--------+------------+------------------+ ICA Prox  75      34              heterogenousShadowing          +----------+--------+--------+--------+------------+------------------+ ICA Distal76      32                                             +----------+--------+--------+--------+------------+------------------+ ECA       77      16                                             +----------+--------+--------+--------+------------+------------------+  Portions of this table do not appear on this page. +----------+--------+-------+--------+------------+           PSV cm/sEDV cmsDescribeArm Pressure +----------+--------+-------+--------+------------+ [id number]                                  +----------+--------+-------+--------+------------+ +---------+--------+--+--------+--+ VertebralPSV cm/s52EDV cm/s17 +---------+--------+--+--------+--+ Left Carotid Findings: +----------+--------+--------+--------+--------+------------------+            PSV cm/sEDV cm/sStenosisDescribeComments           +----------+--------+--------+--------+--------+------------------+ CCA Prox  73      23                      intimal thickening +----------+--------+--------+--------+--------+------------------+ CCA Distal76      27                      intimal thickening +----------+--------+--------+--------+--------+------------------+ ICA Prox  65      32                                         +----------+--------+--------+--------+--------+------------------+ ICA Distal102     40                                         +----------+--------+--------+--------+--------+------------------+ ECA       104     17                                         +----------+--------+--------+--------+--------+------------------+ +----------+--------+--------+--------+------------+ SubclavianPSV cm/sEDV cm/sDescribeArm Pressure +----------+--------+--------+--------+------------+           49                      137          +----------+--------+--------+--------+------------+  ABI Findings: +--------+------------------+-----+---------+--------+ Right   Rt Pressure (mmHg)IndexWaveform Comment  +--------+------------------+-----+---------+--------+ Brachial                       triphasicIV       +--------+------------------+-----+---------+--------+ +--------+------------------+-----+---------+-------+ Left    Lt Pressure (mmHg)IndexWaveform Comment +--------+------------------+-----+---------+-------+ [id number]                    triphasic        +--------+------------------+-----+---------+-------+  Right Doppler Findings: +--------+--------+-----+---------+--------+ Site    PressureIndexDoppler  Comments +--------+--------+-----+---------+--------+ Brachial             triphasicIV       +--------+--------+-----+---------+--------+ Radial               triphasic          +--------+--------+-----+---------+--------+ Ulnar                triphasic         +--------+--------+-----+---------+--------+  Left Doppler Findings: +--------+--------+-----+---------+--------+ Site    PressureIndexDoppler  Comments +--------+--------+-----+---------+--------+ [id number]          triphasic         +--------+--------+-----+---------+--------+ Radial               triphasic         +--------+--------+-----+---------+--------+ Ulnar  triphasic         +--------+--------+-----+---------+--------+  Summary: Right Carotid: The extracranial vessels were near-normal with only minimal wall                thickening or plaque. Left Carotid: The extracranial vessels were near-normal with only minimal wall               thickening or plaque. Vertebrals:  Bilateral vertebral arteries demonstrate antegrade flow. Subclavians: Normal flow hemodynamics were seen in bilateral subclavian              arteries. Right Upper Extremity: Doppler waveforms remain within normal limits with right radial compression. Doppler waveforms remain within normal limits with right ulnar compression. Left Upper Extremity: Doppler waveforms remain within normal limits with left radial compression. Doppler waveform obliterate with left ulnar compression.  Electronically signed by Waverly Ferrari MD on 09/29/2019 at 6:36:27 PM.    Final    VAS [place] LOWER EXTREMITY VENOUS (DVT)  Result Date: 10/13/2019  Lower Venous DVTStudy Indications: Stroke.  Risk Factors: None identified. Limitations: Poor ultrasound/tissue interface and patient positioning. Comparison Study: No prior studies. Performing Technologist: Chanda Busing RVT  Examination Guidelines: A complete evaluation includes B-mode imaging, spectral Doppler, color Doppler, and power Doppler as needed of all accessible portions of each vessel. Bilateral testing is considered an integral part of a complete examination. Limited  examinations for reoccurring indications may be performed as noted. The reflux portion of the exam is performed with the patient in reverse Trendelenburg.  +---------+---------------+---------+-----------+----------+--------------+ RIGHT    CompressibilityPhasicitySpontaneityPropertiesThrombus Aging +---------+---------------+---------+-----------+----------+--------------+ CFV      Full           Yes      Yes                                 +---------+---------------+---------+-----------+----------+--------------+ SFJ      Full                                                        +---------+---------------+---------+-----------+----------+--------------+ FV Prox  Full                                                        +---------+---------------+---------+-----------+----------+--------------+ FV Mid   Full                                                        +---------+---------------+---------+-----------+----------+--------------+ FV DistalFull                                                        +---------+---------------+---------+-----------+----------+--------------+ PFV      Full                                                        +---------+---------------+---------+-----------+----------+--------------+  POP      Full           Yes      Yes                                 +---------+---------------+---------+-----------+----------+--------------+ PTV      Full                                                        +---------+---------------+---------+-----------+----------+--------------+ PERO     Full                                                        +---------+---------------+---------+-----------+----------+--------------+ Large, anechoic area noted in the groin.  +---------+---------------+---------+-----------+----------+--------------+ LEFT     CompressibilityPhasicitySpontaneityPropertiesThrombus Aging  +---------+---------------+---------+-----------+----------+--------------+ CFV      Full           Yes      Yes                                 +---------+---------------+---------+-----------+----------+--------------+ SFJ      Full                                                        +---------+---------------+---------+-----------+----------+--------------+ FV Prox  Full                                                        +---------+---------------+---------+-----------+----------+--------------+ FV Mid   Full                                                        +---------+---------------+---------+-----------+----------+--------------+ FV DistalFull                                                        +---------+---------------+---------+-----------+----------+--------------+ PFV      Full                                                        +---------+---------------+---------+-----------+----------+--------------+ POP      Full           Yes      Yes                                 +---------+---------------+---------+-----------+----------+--------------+  PTV      Full                                                        +---------+---------------+---------+-----------+----------+--------------+ PERO     Full                                                        +---------+---------------+---------+-----------+----------+--------------+     Summary: RIGHT: - There is no evidence of deep vein thrombosis in the lower extremity.  - No cystic structure found in the popliteal fossa. - Large, anechoic area noted in the groin.  LEFT: - There is no evidence of deep vein thrombosis in the lower extremity.  - No cystic structure found in the popliteal fossa.  *See table(s) above for measurements and observations. Electronically signed by Lemar Livings MD on 10/13/2019 at 1:46:03 PM.    Final       The results of significant diagnostics  from this hospitalization (including imaging, microbiology, ancillary and laboratory) are listed below for reference.     Microbiology: No results found for this or any previous visit (from the past 240 hour(s)).   Labs: BNP (last 3 results) No results for input(s): BNP in the last 8760 hours. Basic Metabolic Panel: Recent Labs  Lab 10/12/19 0446 10/13/19 0430 10/14/19 0420 10/15/19 0345 10/16/19 0401  NA 142 142 139 138 138  K 3.3* 4.3 3.6 3.7 3.8  CL 110 112* 107 103 104  CO2 21* 21* 23 25 22   GLUCOSE 127* 114* 113* 127* 122*  BUN 17 13 10 9 8   CREATININE 0.91 0.81 0.87 0.81 0.79  CALCIUM 8.7* 8.6* 8.9 9.2 9.4  MG 1.7 2.4 1.6* 1.8 1.8   Liver Function Tests: No results for input(s): AST, ALT, ALKPHOS, BILITOT, PROT, ALBUMIN in the last 168 hours. No results for input(s): LIPASE, AMYLASE in the last 168 hours. No results for input(s): AMMONIA in the last 168 hours. CBC: Recent Labs  Lab 10/10/19 1821 10/11/19 0406 10/12/19 0446 10/13/19 0430 10/14/19 0420  WBC 13.3* 15.1* 11.8* 9.2 9.6  HGB 8.7* 8.7* 8.5* 8.3* 8.4*  HCT 27.8* 28.2* 26.7* 27.0* 27.0*  MCV 98.6 97.9 97.1 97.8 96.4  PLT 209 198 206 199 208   Cardiac Enzymes: No results for input(s): CKTOTAL, CKMB, CKMBINDEX, TROPONINI in the last 168 hours. BNP: Invalid input(s): POCBNP CBG: Recent Labs  Lab 10/15/19 0603 10/15/19 1153 10/15/19 1657 10/15/19 2142 10/16/19 0637  GLUCAP 117* 164* 137* 134* 112*   D-Dimer No results for input(s): DDIMER in the last 72 hours. Hgb A1c No results for input(s): HGBA1C in the last 72 hours. Lipid Profile No results for input(s): CHOL, HDL, LDLCALC, TRIG, CHOLHDL, LDLDIRECT in the last 72 hours. Thyroid function studies No results for input(s): TSH, T4TOTAL, T3FREE, THYROIDAB in the last 72 hours.  Invalid input(s): FREET3 Anemia work up No results for input(s): VITAMINB12, FOLATE, FERRITIN, TIBC, IRON, RETICCTPCT in the last 72 hours. Urinalysis     Component Value Date/Time   COLORURINE AMBER (A) 10/04/2019 0308   APPEARANCEUR TURBID (A) 10/04/2019 0308   LABSPEC 1.025 10/04/2019 0308   PHURINE 5.0 10/04/2019 0308  GLUCOSEU NEGATIVE 10/04/2019 0308   HGBUR NEGATIVE 10/04/2019 0308   BILIRUBINUR NEGATIVE 10/04/2019 0308   KETONESUR 20 (A) 10/04/2019 0308   PROTEINUR 100 (A) 10/04/2019 0308   NITRITE NEGATIVE 10/04/2019 0308   LEUKOCYTESUR MODERATE (A) 10/04/2019 0308   Sepsis Labs Invalid input(s): PROCALCITONIN,  WBC,  LACTICIDVEN Microbiology No results found for this or any previous visit (from the past 240 hour(s)).   Time coordinating discharge:  I have spent 35 minutes face to face with the patient and on the ward discussing the patients care, assessment, plan and disposition with other care givers. >50% of the time was devoted counseling the patient about the risks and benefits of treatment/Discharge disposition and coordinating care.   SIGNED:   Dimple Nanas, MD  [facility] 10/16/2019, 11:40 AM   If 7PM-7AM, please contact night-coverage

## 2019-10-16 NOTE — Progress Notes (Signed)
Kirsteins, Luanna Salk, MD  Physician  Physical Medicine and Rehabilitation  Consult Note     Signed  Date of Service:  10/02/2019  6:00 AM      Related encounter: Admission (Current) from 09/27/2019 in [facility]      Signed      Expand AllCollapse All   Show:Clear all [x] Manual[x] Template[] Copied  Added by: [x] Angiulli, Lavon Paganini, PA-C[x] Kirsteins, Luanna Salk, MD  [] Hover for details          Physical Medicine and Rehabilitation Consult Reason for Consult: Left side weakness with facial droop Referring Physician: Dr. Haroldine Laws     HPI: Brittney Kennedy is a 69 y.o. right-handed female with unremarkable past medical history.  Per chart review patient independent prior to admission.  1 level home.  She lives in the [place] area with a friend.  Presented 09/27/2019 with intermittent nausea and vomiting over the past week as well as epigastric/substernal pain.  Admission chemistries potassium 3.0, troponin high-sensitivity 674, SARS coronavirus negative.  EKG question old septal infarct with lateral ST-T changes.  CT abdomen pelvis negative for acute intra-abdominal or pelvic pathology.  She was placed on intravenous heparin.  Cardiac catheterization revealed severe multivessel CAD with LV dysfunction ejection fraction 25% and patient was stented.    Acute systolic heart failure due to NICM and follow-up management per cardiology services.  Patient did develop right groin hematoma after catheterization and currently maintained on Lovenox as well as low-dose aspirin.  Neurology services consulted 09/30/2019 for lethargy new left facial droop left upper extremity weakness.  Cranial CT scan showed right basal ganglia infarction as well as small remote appearing right pontine infarct.  CT angiogram of the neck moderate stenosis distal right M1 segment.  Moderate to severe stenosis inferior branch of right middle cerebral artery.  No large vessel occlusion.  Follow-up CT of the  head 10/01/2019 shows acute infarct right parietal lobe age-indeterminate infarct right caudate.  EEG negative for seizure.  Plavix was added to patient's prophylaxis for CVA.  Currently n.p.o. with alternative means of nutritional support.  Therapy evaluations completed with recommendations of physical medicine rehab consult.     Review of Systems  Unable to perform ROS: Acuity of condition        Past Medical History:  Diagnosis Date   CAD (coronary artery disease)      a. 09/2019 NSTEMI/Cath: LM nl, LAD 90p, 95m diffuse mid-dist dzs, LCX 970mV groove/OM2, RI 90p, RCA dominant, nl, RPDA 9029mF 25%, glob HK.   [place] with [place]    a. 09/2019 LV gram: EF 25%, global HK.   PUD (peptic ulcer disease)     Seasonal allergies           Past Surgical History:  Procedure Laterality Date   CHOLECYSTECTOMY       CORONARY STENT INTERVENTION N/A 09/30/2019    Procedure: CORONARY STENT INTERVENTION;  Surgeon: JorMartiniqueeter M, MD;  Location: [facility];  Service: Cardiovascular;  Laterality: N/A;   LEFT HEART CATH AND CORONARY ANGIOGRAPHY N/A 09/30/2019    Procedure: LEFT HEART CATH AND CORONARY ANGIOGRAPHY;  Surgeon: JorMartiniqueeter M, MD;  Location: [facility];  Service: Cardiovascular;  Laterality: N/A;   LEFT HEART CATH AND CORS/GRAFTS ANGIOGRAPHY N/A 09/27/2019    Procedure: LEFT HEART CATH AND CORS/GRAFTS ANGIOGRAPHY;  Surgeon: GolMinna MerrittsD;  [facility];  Service: Cardiovascular;  Laterality: N/A;  Family History  Problem Relation Age of Onset   Stroke Mother          died @ 22   Multiple myeloma Father          died @ 57   Multiple myeloma Sister      Social History:  reports that she has never smoked. She has never used smokeless tobacco. She reports that she does not drink alcohol or use drugs. Allergies:       Allergies  Allergen Reactions   No [employer] Food Allergies Anaphylaxis  and Hives      Raw onions          Medications Prior to Admission  Medication Sig Dispense Refill   Ascorbic Acid (VITAMIN C) 1000 MG tablet Take 1,000 mg by mouth daily.       cholecalciferol (VITAMIN D3) 25 MCG (1000 UNIT) tablet Take 1,000 Units by mouth daily.       Echinacea 125 MG CAPS Take 2 capsules by mouth in the morning and at bedtime.       loratadine (CLARITIN) 10 MG tablet Take 10 mg by mouth daily.       Multiple Vitamin (MULTIVITAMIN) capsule Take 1 capsule by mouth daily.          Home: Home Living Family/patient expects to be discharged to:: Private residence Living Arrangements: Non-relatives/Friends Available Help at Discharge: Friend(s) Type of Home: House Home Access: Stairs to enter Technical brewer of Steps: curb Entrance Stairs-Rails: None Home Layout: One level Bathroom Shower/Tub: Multimedia programmer: Standard Home Equipment: None  Functional History: Prior Function Level of Independence: Independent Comments: per chart pt has not been limited physically Functional Status:  Mobility: Bed Mobility Overal bed mobility: Needs Assistance Bed Mobility: Supine to Sit, Sit to Sidelying, Rolling Rolling: Mod assist Supine to sit: +2 for physical assistance, +2 for safety/equipment, Min assist Sit to sidelying: Max assist, +2 for physical assistance, +2 for safety/equipment General bed mobility comments: patient requires support for trunk to ascend and scoot towards EOB, returned to supine with support for trunk and LEs due to fatigue  Transfers Overall transfer level: Needs assistance Equipment used: 2 person hand held assist Transfers: Sit to/from Stand Sit to Stand: +2 safety/equipment, +2 physical assistance, Min assist General transfer comment: 2 Hand held assist to power up and steady from EOB; min assist +2 for side stepping to [facility] Ambulation/Gait Ambulation/Gait assistance: Min assist, +2 physical assistance, +2  safety/equipment Gait Distance (Feet): 9 Feet Assistive device: 2 person hand held assist Gait Pattern/deviations: Decreased stride length, Step-to pattern, Wide base of support General Gait Details: sidesteps on side of bed with pt following with small steps as cued, prompts to get to top of bed to sit down. Gait velocity: slow, controlled   ADL: ADL Overall ADL's : Needs assistance/impaired Grooming: Sitting, Moderate assistance Upper Body Bathing: Sitting, Moderate assistance Lower Body Bathing: Sit to/from stand, Maximal assistance, +2 for physical assistance Upper Body Dressing : Moderate assistance, Sitting Lower Body Dressing: Maximal assistance, Sit to/from stand, +2 for physical assistance Toilet Transfer Details (indicate cue type and reason): simulated side stepping to [facility] with min assist +2 Functional mobility during ADLs: Minimal assistance, Moderate assistance, +2 for safety/equipment, +2 for physical assistance General ADL Comments: pt limited by L sided weakness, decreased activity tolerance and impaired balance   Cognition: Cognition Overall Cognitive Status: Difficult to assess Orientation Level: Oriented to person, Disoriented to place, Disoriented to time, Disoriented to situation Cognition Arousal/Alertness:  Lethargic Behavior During Therapy: Flat affect Overall Cognitive Status: Difficult to assess Area of Impairment: Following commands, Problem solving, Awareness, Attention Current Attention Level: Focused Following Commands: Follows one step commands with increased time, Follows multi-step commands inconsistently Awareness: Emergent Problem Solving: Slow processing, Decreased initiation, Difficulty sequencing, Requires verbal cues General Comments: with increased time pt able to follow commands, pt difficult to undersatnd at times due to slurred speech; decreased problem solving  Difficult to assess due to: Impaired communication   Blood pressure (!) 101/49,  pulse 86, temperature 99.6 F (37.6 C), resp. rate 20, height 5' 2"  (3.235 m), weight 68.6 kg, SpO2 100 %.  Pt somnolent but opens eyes to command, will attend briefly and follow simple commands on RIght side with MMT Anarthric General: No acute distress Mood and affect are appropriate Heart: Regular rate and rhythm no rubs murmurs or extra sounds Lungs: Clear to auscultation, breathing unlabored, no rales or wheezes Abdomen: Positive bowel sounds, soft nontender to palpation, nondistended Extremities: No clubbing, cyanosis, or edema Skin: No evidence of breakdown, no evidence of rash Neurologic:  motor strength is 4/5 in Right and 0/5 deltoid, bicep, tricep, grip,4/5 Right hip flexor, knee extensors, ankle dorsiflexor and plantar flexor 0/5 left HF, 2- KE (with HE synergy), 0/5 ankle DF/PF Tone mildly increased Left quad and hamstrings Sensory exam normal nods yes that she feels pinch on Left fingers and toes , LT testing Cerebellar pt could not cooperate with this Musculoskeletal: no pain with UE or LE ROM   Lab Results Last 24 Hours       Results for orders placed or performed during the hospital encounter of 09/27/19 (from the past 24 hour(s))  Glucose, capillary     Status: Abnormal    Collection Time: 10/01/19  7:47 AM  Result Value Ref Range    Glucose-Capillary 113 (H) 70 - 99 mg/dL  Magnesium     Status: None    Collection Time: 10/01/19 11:07 AM  Result Value Ref Range    Magnesium 1.8 1.7 - 2.4 mg/dL  Phosphorus     Status: None    Collection Time: 10/01/19 11:07 AM  Result Value Ref Range    Phosphorus 3.0 2.5 - 4.6 mg/dL  Magnesium     Status: None    Collection Time: 10/01/19  5:02 PM  Result Value Ref Range    Magnesium 1.9 1.7 - 2.4 mg/dL  Phosphorus     Status: None    Collection Time: 10/01/19  5:02 PM  Result Value Ref Range    Phosphorus 3.1 2.5 - 4.6 mg/dL       Imaging Results (Last 48 hours)  CT ANGIO HEAD W OR WO CONTRAST   Result Date:  09/30/2019 CLINICAL DATA:  Stroke. Left facial droop and left arm weakness. Slurred speech. EXAM: CT ANGIOGRAPHY HEAD AND NECK CT PERFUSION BRAIN TECHNIQUE: Multidetector CT imaging of the head and neck was performed using the standard protocol during bolus administration of intravenous contrast. Multiplanar CT image reconstructions and MIPs were obtained to evaluate the vascular anatomy. Carotid stenosis measurements (when applicable) are obtained utilizing NASCET criteria, using the distal internal carotid diameter as the denominator. Multiphase CT imaging of the brain was performed following IV bolus contrast injection. Subsequent parametric perfusion maps were calculated using RAPID software. CONTRAST:  152m OMNIPAQUE IOHEXOL 350 MG/ML SOLN COMPARISON:  CT head 09/30/2019 FINDINGS: CTA NECK FINDINGS Aortic arch: Atherosclerotic aortic arch. Three-vessel arch. Proximal great vessels widely patent. Right carotid system: Atherosclerotic calcification  distal right common carotid artery without significant stenosis. Atherosclerotic disease right carotid bifurcation without significant stenosis. Tortuous right internal carotid artery. Left carotid system: Mild atherosclerotic disease left common carotid artery without significant stenosis. Left carotid bifurcation widely patent without stenosis. Vertebral arteries: Left vertebral artery dominant. Both vertebral arteries widely patent to the basilar without stenosis. Skeleton: No acute skeletal abnormality. Cervical spine degenerative changes relatively mild. Other neck: Negative for mass or adenopathy. 1 cm right thyroid nodule. No further imaging necessary. Upper chest: Lung apices clear bilaterally. Small left pleural effusion. Review of the MIP images confirms the above findings CTA HEAD FINDINGS Anterior circulation: Cavernous carotid patent bilaterally without significant stenosis. Moderate stenosis distal right M1 segment. Moderate to severe focal stenosis  inferior division of right middle cerebral artery. This corresponds to the area of decreased perfusion on the perfusion study. No vessel occlusion or thrombus identified. Both anterior cerebral arteries patent without stenosis. Left MCA patent without significant stenosis. Posterior circulation: Both vertebral arteries patent to the basilar. PICA patent bilaterally. Basilar widely patent. Superior cerebellar and posterior cerebral arteries patent bilaterally. Mild to moderate stenosis proximal right posterior cerebral artery. Moderate stenosis distal right posterior cerebral artery. Fetal origin of the left posterior cerebral artery. Venous sinuses: Normal venous enhancement. Anatomic variants: None Review of the MIP images confirms the above findings CT Brain Perfusion Findings: ASPECTS: 9 CBF (<30%) Volume: 40m Perfusion (Tmax>6.0s) volume: 265mMismatch Volume: 2976mnfarction Location:Right parietal lobe. IMPRESSION: 1. 29 mL of delayed perfusion right parietal lobe. No fixed infarct on CT perfusion. 2. Moderate stenosis distal right M1 segment. Moderate to severe stenosis inferior branch of right middle cerebral artery. No large vessel occlusion. 3. Moderate stenosis right PCA proximally and distally. 4. No significant carotid or vertebral artery stenosis in the neck. 5. These results were called by telephone at the time of interpretation on 09/30/2019 at 1:50 pm to provider Lindzen, who verbally acknowledged these results. Electronically Signed   By: ChaFranchot GalloD.   On: 09/30/2019 13:51    DG Abd 1 View   Result Date: 10/01/2019 CLINICAL DATA:  NG tube placement. EXAM: ABDOMEN - 1 VIEW COMPARISON:  None. FINDINGS: The NG tube tip is seen in the lower chest at the T10 level, likely in the distal esophagus. Bowel gas pattern is normal. Small amount of contrast in the kidneys and bladder. No acute bone abnormality. IMPRESSION: NG tube tip in the distal esophagus. Electronically Signed   By: JamLorriane ShireD.   On: 10/01/2019 12:34    CT HEAD WO CONTRAST   Result Date: 10/01/2019 CLINICAL DATA:  Stroke follow-up. Lethargy and confusion with right temporal headache. EXAM: CT HEAD WITHOUT CONTRAST TECHNIQUE: Contiguous axial images were obtained from the base of the skull through the vertex without intravenous contrast. COMPARISON:  CT and CTA from yesterday FINDINGS: Brain: Loss of gray-white differentiation centered in the right parietal lobe correlating with the perfusion defect on prior. No hemorrhagic conversion. There has been age-indeterminate infarction of the right caudate head, stable from yesterday. No hydrocephalus or masslike finding Vascular: No hyperdense vessel. Skull: Normal. Negative for fracture or focal lesion. Sinuses/Orbits: No acute finding Other: Motion degraded IMPRESSION: 1. Acute infarct centered in the right parietal lobe correlating with perfusion defects yesterday. 2. Age-indeterminate infarct at the right caudate that is stable from yesterday. 3. Motion degraded Electronically Signed   By: JonMonte FantasiaD.   On: 10/01/2019 06:56    CT ANGIO NECK W OR WO CONTRAST  Result Date: 09/30/2019 CLINICAL DATA:  Stroke. Left facial droop and left arm weakness. Slurred speech. EXAM: CT ANGIOGRAPHY HEAD AND NECK CT PERFUSION BRAIN TECHNIQUE: Multidetector CT imaging of the head and neck was performed using the standard protocol during bolus administration of intravenous contrast. Multiplanar CT image reconstructions and MIPs were obtained to evaluate the vascular anatomy. Carotid stenosis measurements (when applicable) are obtained utilizing NASCET criteria, using the distal internal carotid diameter as the denominator. Multiphase CT imaging of the brain was performed following IV bolus contrast injection. Subsequent parametric perfusion maps were calculated using RAPID software. CONTRAST:  165m OMNIPAQUE IOHEXOL 350 MG/ML SOLN COMPARISON:  CT head 09/30/2019 FINDINGS: CTA NECK  FINDINGS Aortic arch: Atherosclerotic aortic arch. Three-vessel arch. Proximal great vessels widely patent. Right carotid system: Atherosclerotic calcification distal right common carotid artery without significant stenosis. Atherosclerotic disease right carotid bifurcation without significant stenosis. Tortuous right internal carotid artery. Left carotid system: Mild atherosclerotic disease left common carotid artery without significant stenosis. Left carotid bifurcation widely patent without stenosis. Vertebral arteries: Left vertebral artery dominant. Both vertebral arteries widely patent to the basilar without stenosis. Skeleton: No acute skeletal abnormality. Cervical spine degenerative changes relatively mild. Other neck: Negative for mass or adenopathy. 1 cm right thyroid nodule. No further imaging necessary. Upper chest: Lung apices clear bilaterally. Small left pleural effusion. Review of the MIP images confirms the above findings CTA HEAD FINDINGS Anterior circulation: Cavernous carotid patent bilaterally without significant stenosis. Moderate stenosis distal right M1 segment. Moderate to severe focal stenosis inferior division of right middle cerebral artery. This corresponds to the area of decreased perfusion on the perfusion study. No vessel occlusion or thrombus identified. Both anterior cerebral arteries patent without stenosis. Left MCA patent without significant stenosis. Posterior circulation: Both vertebral arteries patent to the basilar. PICA patent bilaterally. Basilar widely patent. Superior cerebellar and posterior cerebral arteries patent bilaterally. Mild to moderate stenosis proximal right posterior cerebral artery. Moderate stenosis distal right posterior cerebral artery. Fetal origin of the left posterior cerebral artery. Venous sinuses: Normal venous enhancement. Anatomic variants: None Review of the MIP images confirms the above findings CT Brain Perfusion Findings: ASPECTS: 9 CBF  (<30%) Volume: 063mPerfusion (Tmax>6.0s) volume: 294mismatch Volume: 30m33mfarction Location:Right parietal lobe. IMPRESSION: 1. 29 mL of delayed perfusion right parietal lobe. No fixed infarct on CT perfusion. 2. Moderate stenosis distal right M1 segment. Moderate to severe stenosis inferior branch of right middle cerebral artery. No large vessel occlusion. 3. Moderate stenosis right PCA proximally and distally. 4. No significant carotid or vertebral artery stenosis in the neck. 5. These results were called by telephone at the time of interpretation on 09/30/2019 at 1:50 pm to provider Lindzen, who verbally acknowledged these results. Electronically Signed   By: CharFranchot Gallo.   On: 09/30/2019 13:51    CARDIAC CATHETERIZATION   Result Date: 09/30/2019  Proximal LAD is 90% stenosed  A drug-eluting stent was successfully placed using a STENT RESOLUTE ONYX 3.0X12.  Post intervention, there is a 0% residual stenosis.  Mid Cx lesion is 95% stenosed.  A drug-eluting stent was successfully placed using a STENT RESOLUTE ONYX 2.5X26.  Post intervention, there is a 0% residual stenosis.  1. Successful PCI of the proximal LAD with DES x 1 2. Successful PCI of the mid LCx with DES x 1. Plan: DAPT for one year. Will continue IV Cangrelor until NG in place and patient can be loaded with oral Plavix.    CT CEREBRAL PERFUSION W CONTRAST  Result Date: 09/30/2019 CLINICAL DATA:  Stroke. Left facial droop and left arm weakness. Slurred speech. EXAM: CT ANGIOGRAPHY HEAD AND NECK CT PERFUSION BRAIN TECHNIQUE: Multidetector CT imaging of the head and neck was performed using the standard protocol during bolus administration of intravenous contrast. Multiplanar CT image reconstructions and MIPs were obtained to evaluate the vascular anatomy. Carotid stenosis measurements (when applicable) are obtained utilizing NASCET criteria, using the distal internal carotid diameter as the denominator. Multiphase CT imaging of  the brain was performed following IV bolus contrast injection. Subsequent parametric perfusion maps were calculated using RAPID software. CONTRAST:  144m OMNIPAQUE IOHEXOL 350 MG/ML SOLN COMPARISON:  CT head 09/30/2019 FINDINGS: CTA NECK FINDINGS Aortic arch: Atherosclerotic aortic arch. Three-vessel arch. Proximal great vessels widely patent. Right carotid system: Atherosclerotic calcification distal right common carotid artery without significant stenosis. Atherosclerotic disease right carotid bifurcation without significant stenosis. Tortuous right internal carotid artery. Left carotid system: Mild atherosclerotic disease left common carotid artery without significant stenosis. Left carotid bifurcation widely patent without stenosis. Vertebral arteries: Left vertebral artery dominant. Both vertebral arteries widely patent to the basilar without stenosis. Skeleton: No acute skeletal abnormality. Cervical spine degenerative changes relatively mild. Other neck: Negative for mass or adenopathy. 1 cm right thyroid nodule. No further imaging necessary. Upper chest: Lung apices clear bilaterally. Small left pleural effusion. Review of the MIP images confirms the above findings CTA HEAD FINDINGS Anterior circulation: Cavernous carotid patent bilaterally without significant stenosis. Moderate stenosis distal right M1 segment. Moderate to severe focal stenosis inferior division of right middle cerebral artery. This corresponds to the area of decreased perfusion on the perfusion study. No vessel occlusion or thrombus identified. Both anterior cerebral arteries patent without stenosis. Left MCA patent without significant stenosis. Posterior circulation: Both vertebral arteries patent to the basilar. PICA patent bilaterally. Basilar widely patent. Superior cerebellar and posterior cerebral arteries patent bilaterally. Mild to moderate stenosis proximal right posterior cerebral artery. Moderate stenosis distal right posterior  cerebral artery. Fetal origin of the left posterior cerebral artery. Venous sinuses: Normal venous enhancement. Anatomic variants: None Review of the MIP images confirms the above findings CT Brain Perfusion Findings: ASPECTS: 9 CBF (<30%) Volume: 039mPerfusion (Tmax>6.0s) volume: 2936mismatch Volume: 2m22mfarction Location:Right parietal lobe. IMPRESSION: 1. 29 mL of delayed perfusion right parietal lobe. No fixed infarct on CT perfusion. 2. Moderate stenosis distal right M1 segment. Moderate to severe stenosis inferior branch of right middle cerebral artery. No large vessel occlusion. 3. Moderate stenosis right PCA proximally and distally. 4. No significant carotid or vertebral artery stenosis in the neck. 5. These results were called by telephone at the time of interpretation on 09/30/2019 at 1:50 pm to provider Lindzen, who verbally acknowledged these results. Electronically Signed   By: CharFranchot Gallo.   On: 09/30/2019 13:51    MR CARDIAC MORPHOLOGY W WO CONTRAST   Result Date: 09/30/2019 CLINICAL DATA:  Patient hx of CAD, HFrEF. Cardiac MR performed to evaluate viability EXAM: CARDIAC MRI TECHNIQUE: The patient was scanned on a 1.5 Tesla GE magnet. A dedicated cardiac coil was used. Functional imaging was done using Fiesta sequences. 2,3, and 4 chamber views were done to assess for RWMA's. Modified Simpson's rule using a short axis stack was used to calculate an ejection fraction on a dedicated work statConservation officer, naturee patient received cc of Gadavist. After 10 minutes inversion recovery sequences were used to assess for infiltration and scar tissue. Due to uncooperative patient, free breathing techniques implemented. CONTRAST:  7 cc  of Gadavist FINDINGS: 1. Normal left ventricular size and thickness. The systolic function is severely reduced (LVEF =29%). There is global hypokinesis. There is no late gadolinium enhancement in the left ventricular myocardium. LVEDV:171 ml LVESV: 121 ml  SV: 50 ml CO: 3.98L/min Myocardial mass: 180g 2. Normal right ventricular size and thickness. RV systolic function is mild to moderately reduced (RVEF =37%). There is global hypokinesis. 3.  Left atrium is mildly dilated, normal right atrial size. 4. Normal size of the aortic root, ascending aorta and pulmonary artery. 5.  No significant valvular abnormalities. 6.  Normal pericardium. Mild circumferencial pericardial effusion. IMPRESSION: 1. Severely reduced LV systolic function, global hypokinesis, LVEF of 29% 2. No evidence of Scar/late gadolinium enhancement. Entire LV myocardium appears viable 3.  Mild to moderately reduced RVEF 4.  Mild circuferential pericardial effusion 5.  Normal LV and RV chamber sizes and thickness 6. Due to uncooperative patient, free breathing techniques implemented which hinders image quality. Electronically Signed   By: Kate Sable M.D.   On: 09/30/2019 12:33    EEG adult   Result Date: 10/01/2019 Lora Havens, MD     10/01/2019  2:33 PM Patient Name: Brittney Kennedy MRN: [id number] Epilepsy Attending: Lora Havens Referring Physician/Provider: Burnetta Sabin, NP Date: 10/01/2019 Duration: 24.40 minutes Patient history: 69 year old female presented with acute left-sided weakness and facial droop.  CT head showed right parietal lobe infarct.  EEG to evaluate for seizures. Level of alertness: awake AEDs during EEG study: None Technical aspects: This EEG study was done with scalp electrodes positioned according to the 10-20 International system of electrode placement. Electrical activity was acquired at a sampling rate of 500Hz  and reviewed with a high frequency filter of 70Hz  and a low frequency filter of 1Hz . EEG data were recorded continuously and digitally stored. Description: During awake state, no clear posterior dominant rhythm was seen.  EEG showed continuous 2 to 3 Hz amplitude delta slowing in the right hemisphere as well as 3 to 6 Hz theta-delta slowing in left  hemisphere.  Hyperventilation and photic stimulation were not performed. Abnormality -Continuous slow, generalized and lateralized right hemisphere IMPRESSION: This study is suggestive of cortical dysfunction in the right hemisphere likely secondary to underlying infarct as well as moderate to severe diffuse encephalopathy, nonspecific to etiology. No seizures or epileptiform discharges were seen throughout the recording. 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 Does the need for close, 24 hr/day medical supervision in concert with the patient's rehab needs make it unreasonable for this patient to be served in a less intensive setting? Potentially 2. Co-Morbidities requiring supervision/potential complications: Cardiomyopathy, CAD s/p STEMI 3. Due to bladder management, bowel management, safety, skin/wound care, disease management, medication administration, pain management and patient education, does the patient require 24 hr/day rehab nursing? Yes 4. Does the patient require coordinated care of a physician, rehab nurse, therapy disciplines of PT, OT, SLP to address physical and functional deficits in the context of the above medical diagnosis(es)? Potentially Addressing deficits in the following areas: balance, endurance, locomotion, strength, transferring, bowel/bladder control, bathing, dressing, feeding, grooming, toileting, cognition, swallowing and psychosocial support 5. Can the patient actively participate in an intensive therapy program of at least 3 hrs of therapy per day at least 5 days per week? No 6. The potential for patient to make measurable gains while on inpatient rehab is curently  poor but will monitor for improvements 7. Anticipated functional outcomes upon discharge from inpatient rehab are n/a  with PT, n/a with OT, n/a with SLP. 8. Estimated rehab length of stay to  reach the above functional goals is: NA 9. Anticipated discharge destination: Home vs SNF 10. Overall Rehab/Functional Prognosis: fair   RECOMMENDATIONS: This patient's condition is appropriate for continued rehabilitative care in the following setting: Currently not able to tolerate CIR, acute care PT, OT, SLP will cont to follow Patient has agreed to participate in recommended program. N/A Note that insurance prior authorization may be required for reimbursement for recommended care.   Comment: Discussed with Pt's son and son in law , admission coordinator to follow   Cathlyn Parsons, PA-C 10/02/2019  "I have personally performed a face to face diagnostic evaluation of this patient.  Additionally, I have reviewed and concur with the physician assistant's documentation above."   Charlett Blake M.D. [facility] FAAPM&R (Neuromuscular Med) Diplomate Am Board of Electrodiagnostic Med Fellow Am Board of Interventional Pain          Revision History                     Routing History

## 2019-10-16 NOTE — Progress Notes (Signed)
Inpatient Rehabilitation-Admissions Coordinator   Met with pt bedside to alert her to bed availability. She is in agreement for IP Rehab admit today. I have spoken to her son and reviewed consent forms and insurance benefits letter. I have medical clearance from Dr. Reesa Chew to admit to CIR today. RN and TOC updated on plan.   Please call if questions.   Raechel Ache, OTR/L  Rehab Admissions Coordinator  [phone] 10/16/2019 11:03 AM

## 2019-10-16 NOTE — Progress Notes (Signed)
Physical Therapy Treatment Patient Details Name: Brittney Kennedy MRN: [id number] DOB: [birth year] Today's Date: 10/16/2019    History of Present Illness 69 year old female to the hospital for nausea, vomiting, epigastric pain x 1 week, admitted 09/27/19 for NSTEMI. cath 3/26 revealed severe multivessel CAD. Patient transferred to [facility] Fall 3/27 with R groin hematoma. 3/28 patient with AMS due to pain meds and rapid response 3/29 due to AMS and L facial droop with weakness. CT: R basal ganglia infarct, MRI on 10/09/19 reveals large R MCA infarct. PMH: CAD, cholecystectomy    PT Comments    Patient slightly more confused this morning (see cognition). This improved throughout session and ?due to recently awakening? Better able to engage LLE in weight-bearing and to allow advancing RLE as pivoting to [facility]. Quickly fatigues in standing requiring increased assist (moderate) while provided peri-care.    Follow Up Recommendations  CIR     Equipment Recommendations  Other (comment)(TBA)    Recommendations for Other Services       Precautions / Restrictions Precautions Precautions: Fall Precaution Comments: aspiration precautions (dysphagia 1 with nectar), HOB >30 degrees Required Braces or Orthoses: Other Brace Other Brace: L sling to prevent further sublaxation Restrictions Weight Bearing Restrictions: No    Mobility  Bed Mobility Overal bed mobility: Needs Assistance Bed Mobility: Rolling;Sidelying to Sit Rolling: Mod assist Sidelying to sit: Mod assist(up from her rt side)       General bed mobility comments: pt requires incr time for processing and assist for all mobility due to weakness; pt able to use RUE to assist with raising torso from side to sit; pt able to assist with scooting left hip out to EOB to get left foot on the floor  Transfers Overall transfer level: Needs assistance   Transfers: Sit to/from Stand;Stand Pivot Transfers Sit to Stand: +2 physical assistance;+2  safety/equipment;Mod assist Stand pivot transfers: +2 physical assistance;+2 safety/equipment;Mod assist       General transfer comment: initiating standing much better; transferred x 4 requiring increased assist as she fatigued (on/off BSC; standing for pericare)  Ambulation/Gait             General Gait Details: not yet appropriate   Stairs             Wheelchair Mobility    Modified Rankin (Stroke Patients Only) Modified Rankin (Stroke Patients Only) Pre-Morbid Rankin Score: No symptoms Modified Rankin: Severe disability     Balance Overall balance assessment: Needs assistance Sitting-balance support: Feet supported;No upper extremity supported Sitting balance-Leahy Scale: Poor Sitting balance - Comments: slight rt lean   Standing balance support: Single extremity supported Standing balance-Leahy Scale: Poor Standing balance comment: LUE supported by PT/belt due to sling is missing                            Cognition Arousal/Alertness: Awake/alert(prefers eyes closed 2/2 rt eye infection) Behavior During Therapy: Restless Overall Cognitive Status: No family/caregiver present to determine baseline cognitive functioning Area of Impairment: Orientation;Attention;Memory;Following commands;Safety/judgement;Awareness;Problem solving                 Orientation Level: Time;Situation("July" "I'm weak on the left, but I didn't have a stroke") Current Attention Level: Sustained Memory: Decreased short-term memory Following Commands: Follows one step commands with increased time Safety/Judgement: Decreased awareness of safety;Decreased awareness of deficits Awareness: Intellectual Problem Solving: Slow processing;Decreased initiation;Difficulty sequencing;Requires verbal cues;Requires tactile cues General Comments: more confused today; speaking of a stack  of paperwork I should have on my desk, "your boss is mean"      Exercises      General  Comments General comments (skin integrity, edema, etc.): Pt able to state she needed to void upon standing. Returned to sitting and able to wait for [facility] to be brought without incontinence.       Pertinent Vitals/Pain Pain Assessment: No/denies pain Faces Pain Scale: No hurt    Home Living                      Prior Function            PT Goals (current goals can now be found in the care plan section) Acute Rehab PT Goals Patient Stated Goal: get stronger Time For Goal Achievement: 10/28/19 Potential to Achieve Goals: Good Progress towards PT goals: Progressing toward goals    Frequency    Min 4X/week      PT Plan Current plan remains appropriate    Co-evaluation              AM-PAC PT "6 Clicks" Mobility   Outcome Measure  Help needed turning from your back to your side while in a flat bed without using bedrails?: A Lot Help needed moving from lying on your back to sitting on the side of a flat bed without using bedrails?: A Lot Help needed moving to and from a bed to a chair (including a wheelchair)?: A Lot Help needed standing up from a chair using your arms (e.g., wheelchair or bedside chair)?: A Lot Help needed to walk in hospital room?: Total Help needed climbing 3-5 steps with a railing? : Total 6 Click Score: 10    End of Session Equipment Utilized During Treatment: Gait belt Activity Tolerance: Patient limited by fatigue Patient left: in chair;with call bell/phone within reach;with chair alarm set Nurse Communication: Mobility status(recommend stedy for back to bed) PT Visit Diagnosis: Other abnormalities of gait and mobility (R26.89);Muscle weakness (generalized) (M62.81);Hemiplegia and hemiparesis;Other symptoms and signs involving the nervous system (R29.898) Hemiplegia - Right/Left: Left Hemiplegia - dominant/non-dominant: Non-dominant Hemiplegia - caused by: Cerebral infarction     Time: 9485-4627 PT Time Calculation (min) (ACUTE  ONLY): 38 min  Charges:  $Gait Training: 23-37 mins $Therapeutic Activity: 8-22 mins                      Brittney Kennedy, PT Pager [phone]    Brittney Kennedy 10/16/2019, 12:08 PM

## 2019-10-16 NOTE — Progress Notes (Signed)
Pt arrived to unit via bed, pt alert to self and place, lungs clear, no c/o pain at time of assessment but c/o of headache when leaving. Son and pt best friend at bedside. Son reports that his partner will be coming up to visit. AC in room and take up with provider tomorrow. Pt had 1st does of Covid vaccine (pfizer)r [phone] at [facility] and reports PNU vaccine at [facility] also.

## 2019-10-17 ENCOUNTER — Telehealth: Payer: Self-pay

## 2019-10-17 ENCOUNTER — Inpatient Hospital Stay (HOSPITAL_COMMUNITY): Payer: Medicare Other | Admitting: Occupational Therapy

## 2019-10-17 ENCOUNTER — Inpatient Hospital Stay (HOSPITAL_COMMUNITY): Payer: Medicare Other | Admitting: Speech Pathology

## 2019-10-17 ENCOUNTER — Inpatient Hospital Stay (HOSPITAL_COMMUNITY): Payer: Medicare Other | Admitting: Physical Therapy

## 2019-10-17 DIAGNOSIS — D62 Acute posthemorrhagic anemia: Secondary | ICD-10-CM

## 2019-10-17 DIAGNOSIS — I10 Essential (primary) hypertension: Secondary | ICD-10-CM

## 2019-10-17 DIAGNOSIS — I749 Embolism and thrombosis of unspecified artery: Secondary | ICD-10-CM

## 2019-10-17 DIAGNOSIS — R7303 Prediabetes: Secondary | ICD-10-CM

## 2019-10-17 DIAGNOSIS — H16209 Unspecified keratoconjunctivitis, unspecified eye: Secondary | ICD-10-CM

## 2019-10-17 DIAGNOSIS — I5021 Acute systolic (congestive) heart failure: Secondary | ICD-10-CM

## 2019-10-17 DIAGNOSIS — I63511 Cerebral infarction due to unspecified occlusion or stenosis of right middle cerebral artery: Secondary | ICD-10-CM

## 2019-10-17 DIAGNOSIS — I69391 Dysphagia following cerebral infarction: Secondary | ICD-10-CM

## 2019-10-17 LAB — COMPREHENSIVE METABOLIC PANEL
ALT: 19 U/L (ref 0–44)
AST: 34 U/L (ref 15–41)
Albumin: 2.5 g/dL — ABNORMAL LOW (ref 3.5–5.0)
Alkaline Phosphatase: 75 U/L (ref 38–126)
Anion gap: 11 (ref 5–15)
BUN: 8 mg/dL (ref 8–23)
CO2: 23 mmol/L (ref 22–32)
Calcium: 9.3 mg/dL (ref 8.9–10.3)
Chloride: 105 mmol/L (ref 98–111)
Creatinine, Ser: 0.86 mg/dL (ref 0.44–1.00)
GFR calc Af Amer: >60 mL/min (ref >60)
GFR calc non Af Amer: >60 mL/min (ref >60)
Glucose, Bld: 119 mg/dL — ABNORMAL HIGH (ref 70–99)
Potassium: 3.8 mmol/L (ref 3.5–5.1)
Sodium: 139 mmol/L (ref 135–145)
Total Bilirubin: 1.1 mg/dL (ref 0.3–1.2)
Total Protein: 5.4 g/dL — ABNORMAL LOW (ref 6.5–8.1)

## 2019-10-17 LAB — CBC WITH DIFFERENTIAL/PLATELET
Abs Immature Granulocytes: 0.1 10*3/uL — ABNORMAL HIGH (ref 0.00–0.07)
Basophils Absolute: 0.1 10*3/uL (ref 0.0–0.1)
Basophils Relative: 1 %
Eosinophils Absolute: 0.1 10*3/uL (ref 0.0–0.5)
Eosinophils Relative: 2 %
HCT: 29.2 % — ABNORMAL LOW (ref 36.0–46.0)
Hemoglobin: 9.1 g/dL — ABNORMAL LOW (ref 12.0–15.0)
Immature Granulocytes: 1 %
Lymphocytes Relative: 27 %
Lymphs Abs: 2 10*3/uL (ref 0.7–4.0)
MCH: 29.6 pg (ref 26.0–34.0)
MCHC: 31.2 g/dL (ref 30.0–36.0)
MCV: 95.1 fL (ref 80.0–100.0)
Monocytes Absolute: 0.9 10*3/uL (ref 0.1–1.0)
Monocytes Relative: 13 %
Neutro Abs: 4.2 10*3/uL (ref 1.7–7.7)
Neutrophils Relative %: 56 %
Platelets: 286 10*3/uL (ref 150–400)
RBC: 3.07 MIL/uL — ABNORMAL LOW (ref 3.87–5.11)
RDW: 16.9 % — ABNORMAL HIGH (ref 11.5–15.5)
WBC: 7.4 10*3/uL (ref 4.0–10.5)
nRBC: 0 % (ref 0.0–0.2)

## 2019-10-17 LAB — GLUCOSE, CAPILLARY
Glucose-Capillary: 115 mg/dL — ABNORMAL HIGH (ref 70–99)
Glucose-Capillary: 165 mg/dL — ABNORMAL HIGH (ref 70–99)
Glucose-Capillary: 121 mg/dL — ABNORMAL HIGH (ref 70–99)
Glucose-Capillary: 157 mg/dL — ABNORMAL HIGH (ref 70–99)

## 2019-10-17 MED ORDER — ADULT MULTIVITAMIN W/MINERALS CH
1.0000 | ORAL_TABLET | Freq: Every day | ORAL | Status: DC
  Administered 2019-10-17 – 2019-11-14 (×28): 1 via ORAL
  Filled 2019-10-17 (×29): qty 1

## 2019-10-17 NOTE — Evaluation (Signed)
Speech Language Pathology Assessment and Plan  Patient Details  Name: Brittney Kennedy MRN: [id number] Date of Birth: May 03, 1951  SLP Diagnosis: Cognitive Impairments;Dysphagia;Dysarthria  Rehab Potential: Fair ELOS: 4 weeks    Today's Date: 10/17/2019 SLP Individual Time: 0800-0900 SLP Individual Time Calculation (min): 60 min   Problem List:  Patient Active Problem List   Diagnosis Date Noted  . Acute systolic congestive heart failure ([address])   . Essential hypertension   . Keratoconjunctivitis   . Right middle cerebral artery stroke ([place]) 10/16/2019  . Embolic infarction ([place]) 10/16/2019  . Prediabetes   . Benign essential HTN   . Dysphagia, post-stroke   . Duodenal ulcer   . Acute blood loss anemia 10/08/2019  . Upper GI bleed   . Status post coronary artery stent placement   . Malnutrition of moderate degree 10/02/2019  . Cerebral embolism with cerebral infarction 10/01/2019  . CAD (coronary artery disease) 09/27/2019  . Acute systolic CHF (congestive heart failure) ([place]) 09/27/2019  . NSTEMI (non-ST elevated myocardial infarction) ([place]) 09/26/2019   Past Medical History:  Past Medical History:  Diagnosis Date  . CAD (coronary artery disease)    a. 09/2019 NSTEMI/Cath: LM nl, LAD 90p, 76m diffuse mid-dist dzs, LCX 953mV groove/OM2, RI 90p, RCA dominant, nl, RPDA 9030mF 25%, glob HK.  . HMarland KitchenrEF (heart failure with reduced ejection fraction) ([place]  a. 09/2019 LV gram: EF 25%, global HK.  . PMarland KitchenD (peptic ulcer disease)   . Seasonal allergies    Past Surgical History:  Past Surgical History:  Procedure Laterality Date  . CHOLECYSTECTOMY    . CORONARY STENT INTERVENTION N/A 09/30/2019   Procedure: CORONARY STENT INTERVENTION;  Surgeon: JorMartiniqueeter M, MD;  Location: [facility];  Service: Cardiovascular;  Laterality: N/A;  . ESOPHAGOGASTRODUODENOSCOPY (EGD) WITH PROPOFOL N/A 10/08/2019   Procedure: ESOPHAGOGASTRODUODENOSCOPY (EGD) WITH PROPOFOL;  Surgeon: ArmYetta FlockD;  [facility];  Laterality: N/A;  . HEMOSTASIS CLIP PLACEMENT  10/08/2019   Procedure: HEMOSTASIS CLIP PLACEMENT;  Surgeon: ArmYetta FlockD;  [facility];;  . HEMOSTASIS CONTROL  10/08/2019   Procedure: HEMOSTASIS CONTROL;  Surgeon: ArmYetta FlockD;  [facility];;  . LEFT HEART CATH AND CORONARY ANGIOGRAPHY N/A 09/30/2019   Procedure: LEFT HEART CATH AND CORONARY ANGIOGRAPHY;  Surgeon: JorMartiniqueeter M, MD;  Location: [facility];  Service: Cardiovascular;  Laterality: N/A;  . LEFT HEART CATH AND CORS/GRAFTS ANGIOGRAPHY N/A 09/27/2019   Procedure: LEFT HEART CATH AND CORS/GRAFTS ANGIOGRAPHY;  Surgeon: GolMinna MerrittsD;  [facility];  Service: Cardiovascular;  Laterality: N/A;    Assessment / Plan / Recommendation Clinical Impression Brittney Kennedy a 68 41ar old right-handed female with unremarkable past medical history.  History taken from chart review due to distraction. Patient was independent prior to admission. She presented on 09/27/2019 with intermittent nausea/vomitting x1 week as well as epigastric substernal pain.  Admission chemistries potassium 3.0, troponin high-sensitivity 674, SARS coronavirus negative. Cardiac catheterization revealed severe multivessel CAD with LV dysfunction, ejection fraction of 25%. Patient was stented. Acute systolic heart failure due to an ICM. Patient did develop right groin hematoma after catheterization and currently maintained on Lovenox as well as low-dose aspirin.  Neurology consulted on 09/30/19 for lethargy, left facial droop, left upper extremity weakness.  Cranial CT showed right basal ganglia infarction as well as remote appearing right pontine infarction.  CT  angiogram of the neck moderate stenosis distal right M1 segment.  Moderate to severe stenosis inferior branch of right middle  cerebral artery.  No large vessel occlusion.  Follow-up CT of the head 10/01/2019 showed acute infarct right parietal lobe age-indeterminate infarct right caudate.  Gastroenterology services consulted 10/08/2019 for melena BUN markedly elevated 49-50, hemoglobin dipping to 8.7 down to 7.3, WBC 17,700.  EGD upper endoscopy completed 10/08/2019 per Dr. Havery Moros showed actively bleeding duodenal ulcer with visible vessel treated with epi and hemostasis clips x5.  Hospital course further complicated by UTI. Ophthalmology consulted 10/12/2019 Dr. Dennison Mascot for keratoconjunctivitis who recommended erythromycin ophthalmic 4 times daily x7 days with warm compresses.  Patient is currently on a dysphagia #1 nectar thick liquid diet.  Therapy evaluations completed and patient was admitted for a comprehensive rehab program on 10/16/19.   Pt presents with moderate cognitive deficits in the areas of task initiation, sustained attention to task, recall of information as well as orientation information (stated it was 2022), basic problem solving and awareness of deficits. Pt was restless and internally distracted throughout evaluation. This made all tasks very challenging for pt. Pt's speech is dysarthric that consists of imprecise articulation and reduced vocal intensity. As such her speech intelligiblity is reduced to ~ 75% at the word level and  < 50% at the phrase level. Pt also presents with mild oropharyngeal dysphagia and is currently on dysphagia 1 diet with nectar thick liquids with water protocol appropriate.   Pt requires skilled ST to target the above mentioned deficits, increase functional independence and reduce caregiver burden.     Skilled Therapeutic Interventions          Skilled treatment session targeted increased speech intelligibility at the phrase level. SLP facilitated session by providing Max A cues to over-articulate. With Max A cues, pt was able to improve speech intelligibility to ~ 50% at the  phrase level.    SLP Assessment  Patient will need skilled Speech Lanaguage Pathology Services during CIR admission    Recommendations  SLP Diet Recommendations: Dysphagia 1 (Puree);Nectar Liquid Administration via: Cup Medication Administration: Whole meds with puree Supervision: Staff to assist with self feeding Compensations: Minimize environmental distractions;Slow rate;Small sips/bites Postural Changes and/or Swallow Maneuvers: Seated upright 90 degrees;Upright 30-60 min after meal Recommendations for Other Services: Neuropsych consult Patient destination: (TBD) Follow up Recommendations: (TBD) Equipment Recommended: To be determined    SLP Frequency 3 to 5 out of 7 days   SLP Duration  SLP Intensity  SLP Treatment/Interventions 4 weeks  Minumum of 1-2 x/day, 30 to 90 minutes  Functional tasks;Cognitive remediation/compensation;Patient/family education;Dysphagia/aspiration precaution training    Pain Pain Assessment Pain Score: 0-No pain  Prior Functioning Cognitive/Linguistic Baseline: Within functional limits Type of Home: House  Lives With: Friend(s) Available Help at Discharge: Friend(s) Education: highschool Vocation: Retired  Programmer, systems Overall Cognitive Status: Impaired/Different from baseline Arousal/Alertness: Lethargic Orientation Level: Oriented to person;Oriented to place;Oriented to situation(knows it is April but unable to recall year though states " we just elected Biden") Attention: Focused;Sustained Focused Attention: Impaired Focused Attention Impairment: Verbal basic Sustained Attention: Impaired Sustained Attention Impairment: Verbal basic;Functional basic Memory: Impaired Memory Impairment: Retrieval deficit;Decreased recall of new information Immediate Memory Recall: Sock;Blue;Bed Memory Recall Sock: Without Cue Memory Recall Blue: With Cue Memory Recall Bed: With Cue Awareness: Impaired Awareness Impairment:  Intellectual impairment;Emergent impairment Problem Solving: Impaired Problem Solving Impairment: Verbal basic;Functional basic Behaviors: Restless;Impulsive;Poor frustration tolerance Safety/Judgment: Impaired  Comprehension Auditory Comprehension Overall Auditory Comprehension: Appears  within functional limits for tasks assessed Visual Recognition/Discrimination Discrimination: Not tested Reading Comprehension Reading Status: Not tested Expression Expression Primary Mode of Expression: Verbal Verbal Expression Overall Verbal Expression: Impaired Initiation: Impaired Level of Generative/Spontaneous Verbalization: Sentence Pragmatics: Impairment Impairments: Abnormal affect;Eye contact Interfering Components: Attention;Speech intelligibility Non-Verbal Means of Communication: Not applicable Written Expression Dominant Hand: Right Written Expression: Not tested Oral Motor Oral Motor/Sensory Function Overall Oral Motor/Sensory Function: Mild impairment Facial ROM: Reduced left Facial Symmetry: Suspected CN VII (facial) dysfunction;Abnormal symmetry left Lingual ROM: Reduced left Lingual Symmetry: Abnormal symmetry left Lingual Strength: Reduced Motor Speech Overall Motor Speech: Impaired Respiration: Within functional limits Level of Impairment: Phrase Phonation: Low vocal intensity Resonance: Within functional limits Articulation: Impaired Level of Impairment: Phrase Intelligibility: Intelligibility reduced Word: 75-100% accurate Phrase: 50-74% accurate Sentence: 0-24% accurate Conversation: 0-24% accurate Motor Planning: Witnin functional limits Motor Speech Errors: Aware;Unaware;Consistent Effective Techniques: Increased vocal intensity;Over-articulate;Slow rate   PMSV Assessment  PMSV Trial Intelligibility: Intelligibility reduced Word: 75-100% accurate Phrase: 50-74% accurate Sentence: 0-24% accurate Conversation: 0-24% accurate  Bedside Swallowing  Assessment General Date of Onset: 09/27/19 Previous Swallow Assessment: MBS - 4/3 - recommended dysphagia 1 with nectar thick liquids Diet Prior to this Study: Dysphagia 1 (puree);Nectar-thick liquids Temperature Spikes Noted: No Respiratory Status: Room air History of Recent Intubation: No Behavior/Cognition: Alert;Impulsive;Distractible Oral Cavity - Dentition: Missing dentition Self-Feeding Abilities: Needs assist Patient Positioning: Upright in bed Baseline Vocal Quality: Low vocal intensity Volitional Cough: Cognitively unable to elicit Volitional Swallow: Unable to elicit  Oral Care Assessment   Ice Chips Ice chips: Not tested Thin Liquid Thin Liquid: Not tested Nectar Thick Nectar Thick Liquid: Within functional limits Presentation: Self Fed;Cup Honey Thick Honey Thick Liquid: Not tested Puree Puree: Impaired Presentation: Self Fed;Spoon Oral Phase Impairments: Poor awareness of bolus Oral Phase Functional Implications: Prolonged oral transit;Left anterior spillage Solid   BSE Assessment Risk for Aspiration Impact on safety and function: Moderate aspiration risk;Mild aspiration risk Other Related Risk Factors: Cognitive impairment  Short Term Goals: Week 1: SLP Short Term Goal 1 (Week 1): Pt will sustain attention to basic familiar task for ~ 3 minutes with Mod A cues. SLP Short Term Goal 2 (Week 1): With Mod A cues, pt will utilize speech intelligibilty strategies to increase intelligibility to ~ 75% at simple phrase level. SLP Short Term Goal 3 (Week 1): Pt will consume least restrictive diet with minimal overt s/s of aspiration or dysphagia with Min A cues for swallow strategies.  Refer to Care Plan for Long Term Goals  Recommendations for other services: Neuropsych  Discharge Criteria: Patient will be discharged from SLP if patient refuses treatment 3 consecutive times without medical reason, if treatment goals not met, if there is a change in medical status,  if patient makes no progress towards goals or if patient is discharged from hospital.  The above assessment, treatment plan, treatment alternatives and goals were discussed and mutually agreed upon: by patient and by family    10/17/2019, 3:26 PM  

## 2019-10-17 NOTE — Progress Notes (Signed)
Inpatient Rehabilitation  Patient information reviewed and entered into eRehab system by Lyndall Windt M. Baraa Tubbs, M.A., CCC/SLP, PPS Coordinator.  Information including medical coding, functional ability and quality indicators will be reviewed and updated through discharge.    

## 2019-10-17 NOTE — Progress Notes (Signed)
[place] PHYSICAL MEDICINE & REHABILITATION PROGRESS NOTE  Subjective/Complaints: Patient seen laying in bed this morning.  No reported issues overnight.  She states she slept well.  She states she wants to rest.  ROS: Appears to deny CP, shortness of breath, nausea, vomiting, diarrhea.  Objective: Vital Signs: Blood pressure 131/71, pulse 76, temperature 98.9 F (37.2 C), temperature source Oral, resp. rate 18, height 5\' 2"  (1.575 m), weight 69.8 kg, SpO2 97 %. No results found. Recent Labs    10/17/19 0516  WBC 7.4  HGB 9.1*  HCT 29.2*  PLT 286   Recent Labs    10/16/19 0401 10/17/19 0516  NA 138 139  K 3.8 3.8  CL 104 105  CO2 22 23  GLUCOSE 122* 119*  BUN 8 8  CREATININE 0.79 0.86  CALCIUM 9.4 9.3    Physical Exam: BP 131/71 (BP Location: Right Arm)   Pulse 76   Temp 98.9 F (37.2 C) (Oral)   Resp 18   Ht 5\' 2"  (1.575 m)   Wt 69.8 kg   SpO2 97%   BMI 28.15 kg/m  Constitutional: No distress . Vital signs reviewed. HENT: Normocephalic.  Atraumatic. Eyes: Keeps eyes closed. No discharge. Cardiovascular: No JVD. Respiratory: Normal effort.  No stridor. GI: Non-distended. Skin: Warm and dry.  Intact. Psych: Blunted.  Limited engagement. Musc: No edema in extremities.  No tenderness in extremities. Neurological: Alert Dysarthria  facial weakness Examination limited due to participation  Motor: LUE: ? 0/5 proximal to distal B/l LE: ?3/5 proximal to distal (limited due to participation)   Assessment/Plan: 1. Functional deficits secondary to bilateral embolic infarcts.  Which require 3+ hours per day of interdisciplinary therapy in a comprehensive inpatient rehab setting.  Physiatrist is providing close team supervision and 24 hour management of active medical problems listed below.  Physiatrist and rehab team continue to assess barriers to discharge/monitor patient progress toward functional and medical goals  Care Tool:  Bathing               Bathing assist       Upper Body Dressing/Undressing Upper body dressing   What is the patient wearing?: Hospital gown only    Upper body assist Assist Level: Maximal Assistance - Patient 25 - 49%    Lower Body Dressing/Undressing Lower body dressing            Lower body assist       Toileting Toileting    Toileting assist Assist for toileting: Moderate Assistance - Patient 50 - 74%     Transfers Chair/bed transfer  Transfers assist           Locomotion Ambulation   Ambulation assist              Walk 10 feet activity   Assist           Walk 50 feet activity   Assist           Walk 150 feet activity   Assist           Walk 10 feet on uneven surface  activity   Assist           Wheelchair     Assist               Wheelchair 50 feet with 2 turns activity    Assist            Wheelchair 150 feet activity     Assist  Medical Problem List and Plan: 1.  Altered mental status left hemiparesis secondary to right MCA infarction along with a small left CR and splenium infarct embolic pattern likely due to transient cardiomyopathy with low EF after cardiac catheterization for non-STEMI with stenting complicated by groin hematoma  Begin CIR evaluations 2.  Antithrombotics: -DVT/anticoagulation: SCDs             -antiplatelet therapy: Plavix 3. Pain Management: Tylenol as needed 4. Mood: Amantadine 100 mg twice daily, melatonin 3 mg nightly             -antipsychotic agents: N/A 5. Neuropsych: This patient is not capable of making decisions on her own behalf. 6. Skin/Wound Care: Routine skin checks 7. Fluids/Electrolytes/Nutrition: Routine in and outs.  8.  GI bleed/duodenal ulcer.  Status post EGD 10/08/2019 with epi and hemostasis clips x5.  PPI twice daily             See #9 9.  Acute blood loss anemia.    Hemoglobin 9.1 on 4/15  Continue to monitor 10.  AKI.  Resolved.  11.   Acute urinary tract infection.  Patient completed 5-day course amoxicillin 10/17/2019 12.  Post stroke dysphagia.  Dysphagia #1 nectar liquids.  Follow-up speech therapy             Advance diet as tolerated 13.  Keratoconjunctivitis.  Follow-up ophthalmology services.    Erythromycin 4 times a day x7 days initiated 10/12/19. 14.  Hypertension.  Coreg 3.125 mg twice daily.               Monitor with increased mobility 15.  Hyperlipidemia.  Lipitor             Completed 16.  Prediabetes.  Hemoglobin A1c 5.9.  SSI             Monitor with increased mobility 17. Acute systolic CHF   Filed Weights   10/16/19 1900 10/17/19 0500  Weight: 70.4 kg 69.8 kg    LOS: 1 days A FACE TO FACE EVALUATION WAS PERFORMED   Karis Juba 10/17/2019, 11:05 AM

## 2019-10-17 NOTE — Evaluation (Signed)
Occupational Therapy Assessment and Plan  Patient Details  Name: Brittney Kennedy MRN: [id number] Date of Birth: 11-Nov-1950  OT Diagnosis: abnormal posture, apraxia, cognitive deficits, disturbance of vision, hemiplegia affecting non-dominant side and muscle weakness (generalized) Rehab Potential: Rehab Potential (ACUTE ONLY): Good ELOS: 28-30 days   Today's Date: 10/17/2019 OT Individual Time: 1015-1130 OT Individual Time Calculation (min): 75 min   (unattended estim 1130- 1220)  Problem List:  Patient Active Problem List   Diagnosis Date Noted  . Acute systolic congestive heart failure ([place])   . Essential hypertension   . Keratoconjunctivitis   . Right middle cerebral artery stroke ([place]) 10/16/2019  . Embolic infarction ([place]) 10/16/2019  . Prediabetes   . Benign essential HTN   . Dysphagia, post-stroke   . Duodenal ulcer   . Acute blood loss anemia 10/08/2019  . Upper GI bleed   . Status post coronary artery stent placement   . Malnutrition of moderate degree 10/02/2019  . Cerebral embolism with cerebral infarction 10/01/2019  . CAD (coronary artery disease) 09/27/2019  . Acute systolic CHF (congestive heart failure) ([place]) 09/27/2019  . NSTEMI (non-ST elevated myocardial infarction) ([place]) 09/26/2019    Past Medical History:  Past Medical History:  Diagnosis Date  . CAD (coronary artery disease)    a. 09/2019 NSTEMI/Cath: LM nl, LAD 90p, 88m diffuse mid-dist dzs, LCX 968mV groove/OM2, RI 90p, RCA dominant, nl, RPDA 9081mF 25%, glob HK.  . HMarland KitchenrEF (heart failure with reduced ejection fraction) ([place]  a. 09/2019 LV gram: EF 25%, global HK.  . PMarland KitchenD (peptic ulcer disease)   . Seasonal allergies    Past Surgical History:  Past Surgical History:  Procedure Laterality Date  . CHOLECYSTECTOMY    . CORONARY STENT INTERVENTION N/A 09/30/2019   Procedure: CORONARY STENT INTERVENTION;  Surgeon: JorMartiniqueeter M, MD;  Location: [facility];  Service: Cardiovascular;   Laterality: N/A;  . ESOPHAGOGASTRODUODENOSCOPY (EGD) WITH PROPOFOL N/A 10/08/2019   Procedure: ESOPHAGOGASTRODUODENOSCOPY (EGD) WITH PROPOFOL;  Surgeon: ArmYetta FlockD;  [facility];  Laterality: N/A;  . HEMOSTASIS CLIP PLACEMENT  10/08/2019   Procedure: HEMOSTASIS CLIP PLACEMENT;  Surgeon: ArmYetta FlockD;  [facility];;  . HEMOSTASIS CONTROL  10/08/2019   Procedure: HEMOSTASIS CONTROL;  Surgeon: ArmYetta FlockD;  [facility];;  . LEFT HEART CATH AND CORONARY ANGIOGRAPHY N/A 09/30/2019   Procedure: LEFT HEART CATH AND CORONARY ANGIOGRAPHY;  Surgeon: JorMartiniqueeter M, MD;  Location: [facility];  Service: Cardiovascular;  Laterality: N/A;  . LEFT HEART CATH AND CORS/GRAFTS ANGIOGRAPHY N/A 09/27/2019   Procedure: LEFT HEART CATH AND CORS/GRAFTS ANGIOGRAPHY;  Surgeon: GolMinna MerrittsD;  [facility];  Service: Cardiovascular;  Laterality: N/A;    Assessment & Plan Clinical Impression: DonRegana Kennedy a 68 82ar old right-handed female with unremarkable past medical history.  History taken from chart review due to distraction. Patient was independent prior to admission 1 level home.  [place].  Family planning assistance as needed on discharge.  She presented on 09/27/2019 with intermittent nausea/vomitting x1 week as well as epigastric substernal pain.  Admission chemistries potassium 3.0, troponin high-sensitivity 674, SARS coronavirus negative.  EKG question old septal infarct with lateral ST-T changes CT abdomen pelvis negative for acute intra-abdominal or pelvic pathology.  She was placed on IV heparin.  Cardiac catheterization revealed severe multivessel  CAD with LV dysfunction, ejection fraction of 25%. Patient was stented. Acute systolic heart failure due to an ICM and follow-up management per  cardiology services.  Patient did develop right groin hematoma after catheterization and currently maintained on Lovenox as well as low-dose aspirin.  Neurology consulted on 09/30/19 for lethargy, left facial droop, left upper extremity weakness.  Cranial CT showed right basal ganglia infarction as well as remote appearing right pontine infarction.  CT angiogram of the neck moderate stenosis distal right M1 segment.  Moderate to severe stenosis inferior branch of right middle cerebral artery.  No large vessel occlusion.  Follow-up CT of the head 10/01/2019 showed acute infarct right parietal lobe age-indeterminate infarct right caudate.  EEG negative for seizures.  Plavix was added to patient's prophylaxis for CVA.  Gastroenterology services consulted 10/08/2019 for melena BUN markedly elevated 49-50, hemoglobin dipping to 8.7 down to 7.3, WBC 17,700.  EGD upper endoscopy completed 10/08/2019 per Dr. Havery Moros showed actively bleeding duodenal ulcer with visible vessel treated with epi and hemostasis clips x5.  Latest hemoglobin stable 8.4 on 4/12 and renal function improved BUN 9 creatinine 0.81.  GI had recommended PPI twice daily and outpatient follow-up.  Patient's aspirin is been discontinued and currently maintained on Plavix.  Hospital course further complicated by UTI, urine culture greater 100,000 Aerococcus viridans and patient completed 5-day course of amoxicillin.  Ophthalmology consulted 10/12/2019 Dr. Dennison Mascot for keratoconjunctivitis who recommended erythromycin ophthalmic 4 times daily x7 days with warm compresses.  Patient is currently on a dysphagia #1 nectar thick liquid diet.  Therapy evaluations completed and patient was admitted for a comprehensive rehab program. Please see preadmission assessment from earlier today as well.      Patient transferred to CIR on 10/16/2019 .    Patient currently requires total with basic self-care skills secondary to decreased cardiorespiratoy endurance,  abnormal tone, unbalanced muscle activation and decreased motor planning, decreased visual acuity, decreased visual perceptual skills, decreased visual motor skills and field cut, decreased attention to left, decreased awareness, decreased problem solving, decreased safety awareness and decreased memory and decreased sitting balance, decreased standing balance, decreased postural control and hemiplegia.  Prior to hospitalization, patient was fully independent.   Patient will benefit from skilled intervention to increase independence with basic self-care skills prior to discharge home with care partner.  Anticipate patient will require minimal physical assistance and follow up outpatient.  OT - End of Session Activity Tolerance: Tolerates 10 - 20 min activity with multiple rests Endurance Deficit: Yes OT Assessment Rehab Potential (ACUTE ONLY): Good OT Patient demonstrates impairments in the following area(s): Balance;Endurance;Motor;Safety;Perception;Pain;Cognition;Behavior;Vision;Nutrition OT Basic ADL's Functional Problem(s): Eating;Grooming;Bathing;Dressing;Toileting OT Transfers Functional Problem(s): Toilet;Tub/Shower OT Additional Impairment(s): Fuctional Use of Upper Extremity OT Plan OT Intensity: Minimum of 1-2 x/day, 45 to 90 minutes OT Frequency: 5 out of 7 days OT Duration/Estimated Length of Stay: 28-30 days OT Treatment/Interventions: Balance/vestibular training;Cognitive remediation/compensation;Discharge planning;Functional mobility training;DME/adaptive equipment instruction;Neuromuscular re-education;Pain management;Patient/family education;Psychosocial support;Therapeutic Activities;Self Care/advanced ADL retraining;Therapeutic Exercise;UE/LE Strength taining/ROM;UE/LE Coordination activities;Visual/perceptual remediation/compensation;Functional electrical stimulation OT Self Feeding Anticipated Outcome(s): supervision OT Basic Self-Care Anticipated Outcome(s): min A OT  Toileting Anticipated Outcome(s): min A OT Bathroom Transfers Anticipated Outcome(s): Min A OT Recommendation Patient destination: Home Follow Up Recommendations: Outpatient OT Equipment Recommended: Tub/shower seat   Skilled Therapeutic Intervention Pt seen for initial evaluation and ADL training.  Pt's son present for the first part of the session and reviewed POC, goals, ELOS.  Pt eager to brush her teeth and wash her hair.  See ADL documentation below. Overall pt did participate well but does need max to total A with her self care and mobility due to her deficits.   She does have developing LUE AROM and good sensation.  At end of session placed Saebo Stim One on L deltoid for unattended estim for 50 min.  Pt tolerated well.  Educated pt on the estim and that it is being used to facilitate her deltoids to reduce her subluxation.    Pt resting in bed with alarm on and all needs met.    OT Evaluation Precautions/Restrictions  Precautions Precautions: Fall Precaution Comments: aspiration precautions (dysphagia 1 with nectar), HOB >30 degrees Restrictions Weight Bearing Restrictions: No    Vital Signs Therapy Vitals Temp: 98.3 F (36.8 C) Temp Source: Oral Pulse Rate: 73 Resp: 16 BP: (!) 152/76 Patient Position (if appropriate): Lying Oxygen Therapy SpO2: 100 % O2 Device: Room Air Pain Pain Assessment Pain Score: 0-No pain Home Living/Prior Functioning Home Living Available Help at Discharge: Friend(s) Type of Home: House Home Access: Stairs to enter Technical brewer of Steps: curb Entrance Stairs-Rails: None Home Layout: One level Bathroom Shower/Tub: Multimedia programmer: Standard  Lives With: Friend(s) IADL History Education: highschool Prior Function Level of Independence: Independent with basic ADLs, Independent with homemaking with ambulation, Independent with transfers, Independent with gait  Able to Take Stairs?: Yes Driving:  Yes Vocation: Retired Leisure: Hobbies-yes (Comment) Comments: knitting ADL ADL Grooming: Minimal assistance Where Assessed-Grooming: Sitting at sink Upper Body Bathing: Moderate assistance Where Assessed-Upper Body Bathing: Shower Lower Body Bathing: Maximal assistance Where Assessed-Lower Body Bathing: Shower Upper Body Dressing: Maximal assistance Where Assessed-Upper Body Dressing: Wheelchair Lower Body Dressing: Dependent Where Assessed-Lower Body Dressing: Wheelchair, Bed level Toileting: Not assessed Toilet Transfer: Not assessed Social research officer, government: Maximal assistance Social research officer, government Method: Sit pivot Youth worker: Shower seat with back, Grab bars Vision Baseline Vision/History: Wears glasses Wears Glasses: At all times Patient Visual Report: Blurring of vision Vision Assessment?: Yes Eye Alignment: (difficult to assess as pt frequently keeps R eye closed due to eye irritation) Ocular Range of Motion: Restricted on the left Alignment/Gaze Preference: Gaze right Tracking/Visual Pursuits: Left eye does not track laterally;Unable to hold eye position out of midline;Decreased smoothness of horizontal tracking Visual Fields: Left visual field deficit Perception  Perception: Impaired Inattention/Neglect: Does not attend to left visual field;Does not attend to left side of body Praxis Praxis: Impaired Praxis Impairment Details: Motor planning Praxis-Other Comments: mod impaired Cognition Overall Cognitive Status: Impaired/Different from baseline Arousal/Alertness: Awake/alert Orientation Level: Person;Place;Situation Person: Oriented Place: Oriented Situation: Oriented Year: 2021 Month: April Day of Week: Correct Memory: Impaired Memory Impairment: Retrieval deficit;Decreased recall of new information Immediate Memory Recall: Sock;Blue;Bed Memory Recall Sock: Without Cue Memory Recall Blue: With Cue Memory Recall Bed: With Cue Attention:  Sustained Focused Attention: Impaired Focused Attention Impairment: Verbal basic Sustained Attention: Impaired Sustained Attention Impairment: Verbal basic;Functional basic Awareness: Impaired Awareness Impairment: Intellectual impairment;Emergent impairment Problem Solving: Impaired Problem Solving Impairment: Verbal basic;Functional basic Behaviors: Restless;Impulsive;Poor frustration tolerance Safety/Judgment: Impaired Sensation Sensation Light Touch: Appears Intact Hot/Cold: Appears Intact Proprioception: Impaired by gross assessment Stereognosis: Impaired by gross assessment Coordination Gross Motor Movements are Fluid and Coordinated: No Fine Motor Movements are Fluid and Coordinated: No Coordination and Movement Description: L hemiplegia Finger Nose Finger Test: unable to complete with L hand Motor  Motor Motor: Hemiplegia;Abnormal tone Motor - Skilled Clinical Observations: low tone in L shoulder, 1 finger width subluxation Mobility  Transfers Sit to  Stand: Maximal Assistance - Patient 25-49% Stand to Sit: Maximal Assistance - Patient 25-49%  Trunk/Postural Assessment  Postural Control Postural Control: Deficits on evaluation Trunk Control: L lateral and posterior lean in sitting in shower chair and wc but can sit upright on EOB with close S.  Pt tends to lose balance focus when she feels back support.  Balance Static Sitting Balance Static Sitting - Level of Assistance: 4: Min assist Dynamic Sitting Balance Dynamic Sitting - Level of Assistance: 2: Max assist Static Standing Balance Static Standing - Level of Assistance: 1: +1 Total assist Dynamic Standing Balance Dynamic Standing - Level of Assistance: Not tested (comment) Extremity/Trunk Assessment RUE Assessment RUE Assessment: Within Functional Limits LUE Assessment LUE Body System: Neuro Brunstrum levels for arm and hand: Hand;Arm Brunstrum level for arm: Stage II Synergy is developing Brunstrum level  for hand: Stage II Synergy is developing LUE AROM (degrees) Left Shoulder Flexion: 20 Degrees Left Shoulder ABduction: 10 Degrees Left Elbow Flexion: 40 Left Wrist Extension: 0 Degrees Left Composite Finger Extension: 75% Left Composite Finger Flexion: 50% LUE Strength Left Hand Gross Grasp: Impaired(pt is able to lightly grasp but not sustain a hold on a cup or washcloth) LUE Tone LUE Tone: Hypotonic Hypotonic Details: shoulder subluxation     Refer to Care Plan for Long Term Goals  Recommendations for other services: None    Discharge Criteria: Patient will be discharged from OT if patient refuses treatment 3 consecutive times without medical reason, if treatment goals not met, if there is a change in medical status, if patient makes no progress towards goals or if patient is discharged from hospital.  The above assessment, treatment plan, treatment alternatives and goals were discussed and mutually agreed upon: by patient and by family  [place] 10/17/2019, 1:31 PM

## 2019-10-17 NOTE — Telephone Encounter (Signed)
Patient needs a F/U appt with Dr. Adela Lank one to two months after hospital discharge. As of 4-15 she is still admitted.

## 2019-10-17 NOTE — Evaluation (Signed)
Physical Therapy Assessment and Plan  Patient Details  Name: Brittney Kennedy MRN: [id number] Date of Birth: Apr 09, 1951  PT Diagnosis: Abnormal posture, Abnormality of gait, Cognitive deficits, Difficulty walking, Hemiparesis non-dominant, Hypotonia, Impaired cognition, Impaired sensation and Muscle weakness Rehab Potential: Good ELOS: ~3.5-4 weeks   Today's Date: 10/17/2019 PT Individual Time: 8127-5170 PT Individual Time Calculation (min): 59 min    Problem List:  Patient Active Problem List   Diagnosis Date Noted  . Acute systolic congestive heart failure ([place])   . Essential hypertension   . Keratoconjunctivitis   . Right middle cerebral artery stroke ([place]) 10/16/2019  . Embolic infarction ([place]) 10/16/2019  . Prediabetes   . Benign essential HTN   . Dysphagia, post-stroke   . Duodenal ulcer   . Acute blood loss anemia 10/08/2019  . Upper GI bleed   . Status post coronary artery stent placement   . Malnutrition of moderate degree 10/02/2019  . Cerebral embolism with cerebral infarction 10/01/2019  . CAD (coronary artery disease) 09/27/2019  . Acute systolic CHF (congestive heart failure) ([place]) 09/27/2019  . NSTEMI (non-ST elevated myocardial infarction) ([place]) 09/26/2019    Past Medical History:  Past Medical History:  Diagnosis Date  . CAD (coronary artery disease)    a. 09/2019 NSTEMI/Cath: LM nl, LAD 90p, 38m diffuse mid-dist dzs, LCX 940mV groove/OM2, RI 90p, RCA dominant, nl, RPDA 9053mF 25%, glob HK.  . HMarland KitchenrEF (heart failure with reduced ejection fraction) ([place]  a. 09/2019 LV gram: EF 25%, global HK.  . PMarland KitchenD (peptic ulcer disease)   . Seasonal allergies    Past Surgical History:  Past Surgical History:  Procedure Laterality Date  . CHOLECYSTECTOMY    . CORONARY STENT INTERVENTION N/A 09/30/2019   Procedure: CORONARY STENT INTERVENTION;  Surgeon: JorMartiniqueeter M, MD;  Location: [facility];  Service: Cardiovascular;  Laterality: N/A;  .  ESOPHAGOGASTRODUODENOSCOPY (EGD) WITH PROPOFOL N/A 10/08/2019   Procedure: ESOPHAGOGASTRODUODENOSCOPY (EGD) WITH PROPOFOL;  Surgeon: ArmYetta FlockD;  [facility];  Laterality: N/A;  . HEMOSTASIS CLIP PLACEMENT  10/08/2019   Procedure: HEMOSTASIS CLIP PLACEMENT;  Surgeon: ArmYetta FlockD;  [facility];;  . HEMOSTASIS CONTROL  10/08/2019   Procedure: HEMOSTASIS CONTROL;  Surgeon: ArmYetta FlockD;  [facility];;  . LEFT HEART CATH AND CORONARY ANGIOGRAPHY N/A 09/30/2019   Procedure: LEFT HEART CATH AND CORONARY ANGIOGRAPHY;  Surgeon: JorMartiniqueeter M, MD;  Location: [facility];  Service: Cardiovascular;  Laterality: N/A;  . LEFT HEART CATH AND CORS/GRAFTS ANGIOGRAPHY N/A 09/27/2019   Procedure: LEFT HEART CATH AND CORS/GRAFTS ANGIOGRAPHY;  Surgeon: GolMinna MerrittsD;  [facility];  Service: Cardiovascular;  Laterality: N/A;    Assessment & Plan Clinical Impression: Patient is a [age]o. year old right-handed female with unremarkable past medical history.  History taken from chart review due to distraction. Patient was independent prior to admission 1 level home.  She [place] in the [place] with [place] friend.  Family planning assistance as needed on discharge.  She presented on 09/27/2019 with intermittent nausea/vomitting x1 week as well as epigastric substernal pain.  Admission chemistries potassium 3.0, troponin high-sensitivity 674, SARS coronavirus negative.  EKG question old septal infarct with lateral ST-T changes CT abdomen pelvis negative for acute intra-abdominal or pelvic pathology.  She was placed on IV heparin.  Cardiac catheterization revealed severe multivessel CAD with LV  dysfunction, ejection fraction of 25%. Patient was stented. Acute systolic heart failure due to an ICM and follow-up management per cardiology services.   Patient did develop right groin hematoma after catheterization and currently maintained on Lovenox as well as low-dose aspirin.  Neurology consulted on 09/30/19 for lethargy, left facial droop, left upper extremity weakness.  Cranial CT showed right basal ganglia infarction as well as remote appearing right pontine infarction.  CT angiogram of the neck moderate stenosis distal right M1 segment.  Moderate to severe stenosis inferior branch of right middle cerebral artery.  No large vessel occlusion.  Follow-up CT of the head 10/01/2019 showed acute infarct right parietal lobe age-indeterminate infarct right caudate.  EEG negative for seizures.  Plavix was added to patient's prophylaxis for CVA.  Gastroenterology services consulted 10/08/2019 for melena BUN markedly elevated 49-50, hemoglobin dipping to 8.7 down to 7.3, WBC 17,700.  EGD upper endoscopy completed 10/08/2019 per Dr. Havery Moros showed actively bleeding duodenal ulcer with visible vessel treated with epi and hemostasis clips x5.  Latest hemoglobin stable 8.4 on 4/12 and renal function improved BUN 9 creatinine 0.81.  GI had recommended PPI twice daily and outpatient follow-up.  Patient's aspirin is been discontinued and currently maintained on Plavix.  Hospital course further complicated by UTI, urine culture greater 100,000 Aerococcus viridans and patient completed 5-day course of amoxicillin.  Ophthalmology consulted 10/12/2019 Dr. Dennison Mascot for keratoconjunctivitis who recommended erythromycin ophthalmic 4 times daily x7 days with warm compresses.  Patient is currently on a dysphagia #1 nectar thick liquid diet. Therapy evaluations completed and patient was admitted for a comprehensive rehab program Patient transferred to CIR on 10/16/2019 .   Patient currently requires +2 max assist with mobility secondary to muscle weakness, muscle joint tightness and muscle paralysis, decreased cardiorespiratoy endurance, impaired timing and sequencing, abnormal tone,  unbalanced muscle activation, motor apraxia, decreased coordination and decreased motor planning, decreased attention to left and left side neglect, decreased initiation, decreased attention, decreased awareness, decreased problem solving, decreased safety awareness, decreased memory and delayed processing and decreased sitting balance, decreased standing balance, decreased postural control and decreased balance strategies.  Prior to hospitalization, patient was independent  with mobility and lived with Friend(s) in a House home.  Home access is curbStairs to enter.  Patient will benefit from skilled PT intervention to maximize safe functional mobility, minimize fall risk and decrease caregiver burden for planned discharge home with 24 hour assist.  Anticipate patient will benefit from follow up [facility] at discharge.  PT - End of Session Activity Tolerance: Tolerates 30+ min activity with multiple rests Endurance Deficit: Yes Endurance Deficit Description: repeatedly reports fatigued and requires frequent seated rest breaks PT Assessment Rehab Potential (ACUTE/IP ONLY): Good PT Barriers to Discharge: Decreased caregiver support;Medical stability PT Patient demonstrates impairments in the following area(s): Balance;Perception;Behavior;Safety;Edema;Sensory;Endurance;Skin Integrity;Motor;Nutrition;Pain PT Transfers Functional Problem(s): Bed Mobility;Bed to Chair;Car;Furniture PT Locomotion Functional Problem(s): Ambulation;Wheelchair Mobility;Stairs PT Plan PT Intensity: Minimum of 1-2 x/day ,45 to 90 minutes PT Frequency: 5 out of 7 days PT Duration Estimated Length of Stay: ~3.5-4 weeks PT Treatment/Interventions: Ambulation/gait training;Community reintegration;DME/adaptive equipment instruction;Neuromuscular re-education;Psychosocial support;UE/LE Strength taining/ROM;Stair training;Wheelchair propulsion/positioning;Balance/vestibular training;Discharge planning;Functional electrical stimulation;Pain  management;Skin care/wound management;Therapeutic Activities;UE/LE Coordination activities;Cognitive remediation/compensation;Disease management/prevention;Functional mobility training;Patient/family education;Splinting/orthotics;Therapeutic Exercise;Visual/perceptual remediation/compensation PT Transfers Anticipated Outcome(s): CGA PT Locomotion Anticipated Outcome(s): CGA using LRAD PT Recommendation Recommendations for Other Services: Neuropsych consult;Therapeutic Recreation consult Therapeutic Recreation Interventions: Stress management;Outing/community reintergration Follow Up Recommendations: Home health PT;24 hour supervision/assistance Patient destination: Home Equipment Recommended: To be determined  Skilled Therapeutic Intervention Evaluation completed (see details above and below) with education on PT POC and goals and individual treatment initiated with focus on bed mobility, transfer training, gait training, activity tolerance, L LE NMR, and education regarding daily therapy schedule, weekly team meetings, purpose of PT evaluation, and other CIR information. Pt received supine in bed (strong R lateral trunk lean) with friend/roommmate, Izora Gala, and son, Marden Noble, present. Pt agreeable to therapy session. Pt demonstrates strong L inattention/neglect requiring max cuing to scan L of midline 3x during session and unable to maintain gaze there for even 1 second. Supine>sitting R EOB with +2 max assist for B LE management and trunk upright. RN present for medication administration. L squat pivot transfer to w/c with +2 max assist for lifting/pivoting hips and pt unable to locate w/c to determine direction of transfer due to inattention/neglect. Pt requests to brush teeth at sink - performed with min assist and cuing.  Transported to/from gym in w/c for time management and energy conservation. Simulated car transfer (sedan height - pt has [facility]) via squat pivot transfer with +2 max assist and max  cuing for sequencing. Gait training at R hallway rail with max assist of 1 for balance and L LE gait mechanics and +2 assist w/c follow - required max/total assist for L LE swing phase (though pt demonstrates active movement of LE in bed, anticipate may be due to motor planning/attention impairments), and max/total assist for L knee control during stance with facilitation for hip/knee extension. Pt reports significant fatigue stating she was tired before starting therapy session as well as need to use bathroom. Transported back to room in w/c. Squat pivot transfer w/c<>toilet using R UE support on grab bar and +2 max assist for lifting/pivoting hips. Standing with R UE support on grab bar and max assist while +2 assist performed total assist LB clothing management. Continent of bladder and performed seated peri-care with min assist. R squat pivot transfer to EOB with +2 max assist. Sit>supine with +2 max assist for trunk descent and B LE management into the bed. Pt left with needs in reach, L UE supported on pillows, bed alarm on, and family present.  PT Evaluation Precautions/Restrictions Precautions Precautions: Fall;Other (comment) Precaution Comments: aspiration precautions (HOB >30 degrees), significant L inattention/neglect, signifcant L hemiparesis (UE>LE with L shoulder subluxation) Restrictions Weight Bearing Restrictions: No Pain Pain Assessment Pain Scale: 0-10 Pain Score: 0-No pain Home Living/Prior Functioning Home Living Available Help at Discharge: Friend(s) Type of Home: House Home Access: Stairs to enter Technical brewer of Steps: curb Entrance Stairs-Rails: None Home Layout: One level  Lives With: Friend(s)(best friend, Izora Gala) Prior Function Level of Independence: Independent with basic ADLs;Independent with homemaking with ambulation;Independent with transfers;Independent with gait  Able to Take Stairs?: Yes Driving: Yes Vocation: Retired Leisure: Hobbies-yes  (Comment) Comments: knitting Perception  Perception Perception: Impaired Inattention/Neglect: Does not attend to left visual field;Does not attend to left side of body Praxis Praxis: Impaired Praxis Impairment Details: Motor planning;Initiation  Cognition  Overall Cognitive Status: Impaired/Different from baseline Arousal/Alertness: Lethargic Orientation Level: Oriented to person;Oriented to place;Oriented to situation;Disoriented to time(knows it is April but unable to recall year though states " we just elected Biden") Attention: Focused;Sustained Focused Attention: Impaired Sustained Attention: Impaired Memory: Impaired Awareness: Impaired Problem Solving: Impaired Safety/Judgment: Impaired Sensation Sensation Light Touch: Impaired Detail Light Touch Impaired Details: Impaired LLE Hot/Cold: Not tested Proprioception: Impaired by gross assessment;Impaired Detail Proprioception Impaired Details: Impaired LLE;Impaired LUE Stereognosis: Not tested Coordination Gross Motor  Movements are Fluid and Coordinated: No Coordination and Movement Description: gross motor movements impaired due to L hemiparesis/hemiplegia, L inattention/neglect,and impaired motor planning Heel Shin Test: unable to perform with L LE due to paresis; WFL on R LE Motor  Motor Motor: Hemiplegia;Abnormal tone;Other (comment);Abnormal postural alignment and control Motor - Skilled Clinical Observations: hypotonia in L UE, L hemiplegia (UE>LE), R lateral trunk lean  Mobility Bed Mobility Bed Mobility: Supine to Sit;Sit to Supine Supine to Sit: 2 Helpers;Maximal Assistance - Patient - Patient 25-49% Sit to Supine: 2 Helpers;Maximal Assistance - Patient 25-49% Transfers Transfers: Sit to Stand;Stand to Sit;Squat Pivot Transfers Sit to Stand: Maximal Assistance - Patient 25-49%(R hallway rail or R grab bar) Stand to Sit: Maximal Assistance - Patient 25-49%(R hallway rail) Squat Pivot Transfers: 2  Helpers;Maximal Assistance - Patient 25-49% Transfer (Assistive device): Other (Comment)(R hallway rail) Locomotion  Gait Ambulation: Yes Gait Assistance: 2 Helpers;Maximal Assistance - Patient 25-49%(max A of 1 and +2 w/c follow) Gait Distance (Feet): 10 Feet Assistive device: Other (Comment)(R hallway rail) Gait Assistance Details: Manual facilitation for placement;Manual facilitation for weight shifting;Manual facilitation for weight bearing;Verbal cues for gait pattern;Verbal cues for technique;Tactile cues for placement;Tactile cues for weight shifting;Tactile cues for posture;Tactile cues for sequencing;Tactile cues for initiation;Tactile cues for weight beaing;Verbal cues for sequencing;Verbal cues for precautions/safety Gait Gait: Yes Gait Pattern: Impaired Gait Pattern: Poor foot clearance - left;Narrow base of support;Lateral trunk lean to right;Decreased hip/knee flexion - left;Decreased dorsiflexion - left;Decreased step length - left;Decreased stance time - left Gait velocity: significantly decreased requiring assist for L LE gait mechanics Stairs / Additional Locomotion Stairs: No Wheelchair Mobility Wheelchair Mobility: No  Trunk/Postural Assessment  Cervical Assessment Cervical Assessment: Within Functional Limits Thoracic Assessment Thoracic Assessment: Exceptions to WFL(rounded shoulders) Lumbar Assessment Lumbar Assessment: Exceptions to WFL(posterior pelvic tilt in sitting) Postural Control Postural Control: Deficits on evaluation Trunk Control: impaired Postural Limitations: decreased  Balance Balance Balance Assessed: Yes Static Sitting Balance Static Sitting - Balance Support: Feet supported Static Sitting - Level of Assistance: 4: Min assist Dynamic Sitting Balance Dynamic Sitting - Level of Assistance: 2: Max assist Static Standing Balance Static Standing - Level of Assistance: 1: +1 Total assist;2: Max assist Dynamic Standing Balance Dynamic Standing  - Level of Assistance: 1: +2 Total assist Extremity Assessment  RLE Assessment RLE Assessment: Exceptions to [facility] General Strength Comments: generally 4/5 to 4+/5 demonstrated functionally as pt states "I'm too tired" when attempted formal muscle strength assessmen LLE Assessment LLE Assessment: Exceptions to [facility] General Strength Comments: Generally 2-/5 at ankle, 2/5 at knee and hip with pt demonstrating some active movement but due to reports of fatigue pt unable to participate in formal assessment    Refer to Care Plan for Long Term Goals  Recommendations for other services: Neuropsych and Therapeutic Recreation  Stress management and Outing/community reintegration  Discharge Criteria: Patient will be discharged from PT if patient refuses treatment 3 consecutive times without medical reason, if treatment goals not met, if there is a change in medical status, if patient makes no progress towards goals or if patient is discharged from hospital.  The above assessment, treatment plan, treatment alternatives and goals were discussed and mutually agreed upon: by patient and by family  Tawana Scale, PT, DPT 10/17/2019, 1:28 PM

## 2019-10-17 NOTE — Progress Notes (Signed)
Initial Nutrition Assessment  DOCUMENTATION CODES:   Not applicable  INTERVENTION:  Vital Cuisine shake PO TID, each supplement provides 500 kcal and 22 grams of protein  MVI daily   If being properly thickened and if pt consuming well, may continue Boost Breeze po BID, each supplement provides 250 kcal and 9 grams of protein   NUTRITION DIAGNOSIS:   Increased nutrient needs related to other (see comment)(therapies) as evidenced by estimated needs.    GOAL:   Patient will meet greater than or equal to 90% of their needs    MONITOR:   PO intake, Supplement acceptance, Weight trends, Labs, Diet advancement  REASON FOR ASSESSMENT:   Malnutrition Screening Tool    ASSESSMENT:   69 yo female admitted with NSTEMI, acute CHF and later had a change in mental status and found to have acute stroke. PMH includes PUD, seasonal allergies, cholecystectomy and left rotator cuff repair.   3/26 Cardiac Cath, 3v CAD, EF 20% (new onset acute CHF) 3/28 Rapid response due to lethargy, slurred speech, CT head no acute findings 3/29 CVA: R basal infarct, small r pontine infarct 3/30 CT acute infact centered in r. Parietal lobe 4/3 MBS, Dysphagia 1, Nectar thick liquids 4/6 Rapid Response due to hypotension and dark tarry liquid stools; s/p EGD 4/14 admit to CIR  Pt sleeping at time of RD visit. RD allowed pt to continue resting.   Pt known to this RD from time on 3W. Per pt's son, pt's appetite had been decreased PTA and she had been eating less and pt indicated she had lost weight but unsure how much. At baseline, pt lives with her best friend. She is mobile and does her own grocery shopping and cooking. While admitted to 3W, pt was upgraded to Dysphagia 1 diet with Nectar Thick liquids and was consuming Vital Cuisine shakes well. Pt's son was also bringing Naked juices with added protein to thicken up which patient was consuming well. Pt seen by this RD two days ago and pt reported  appetite improving.   PO intake: 25-50% x 2 recorded meals  Per wt readings, pt with a 5.5% wt loss x1 month, which is significant for time frame.  Labs reviewed: CBGs 97-165 Medications reviewed and include: Colace, Boost Breeze BID, Novolog   Diet Order:   Diet Order            DIET - DYS 1 Room service appropriate? Yes; Fluid consistency: Nectar Thick  Diet effective now              EDUCATION NEEDS:   Not appropriate for education at this time  Skin:  Skin Assessment: Reviewed RN Assessment  Last BM:  4/13  Height:   Ht Readings from Last 1 Encounters:  10/16/19 5\' 2"  (1.575 m)    Weight:   Wt Readings from Last 1 Encounters:  10/17/19 69.8 kg    BMI:  Body mass index is 28.15 kg/m.  Estimated Nutritional Needs:   Kcal:  1500-1700  Protein:  75-85 grams  Fluid:  >/= 1.5 L   10/19/19, MS, RD, LDN RD pager number and weekend/on-call pager number located in [place].

## 2019-10-18 ENCOUNTER — Inpatient Hospital Stay (HOSPITAL_COMMUNITY): Payer: Medicare Other | Admitting: Speech Pathology

## 2019-10-18 ENCOUNTER — Encounter (HOSPITAL_COMMUNITY): Payer: Medicare Other | Admitting: Speech Pathology

## 2019-10-18 ENCOUNTER — Inpatient Hospital Stay (HOSPITAL_COMMUNITY): Payer: Medicare Other | Admitting: Physical Therapy

## 2019-10-18 ENCOUNTER — Inpatient Hospital Stay (HOSPITAL_COMMUNITY): Payer: Medicare Other | Admitting: Occupational Therapy

## 2019-10-18 ENCOUNTER — Inpatient Hospital Stay (HOSPITAL_COMMUNITY): Payer: Medicare Other

## 2019-10-18 DIAGNOSIS — I63511 Cerebral infarction due to unspecified occlusion or stenosis of right middle cerebral artery: Secondary | ICD-10-CM | POA: Diagnosis not present

## 2019-10-18 DIAGNOSIS — I5021 Acute systolic (congestive) heart failure: Secondary | ICD-10-CM | POA: Diagnosis not present

## 2019-10-18 DIAGNOSIS — R0989 Other specified symptoms and signs involving the circulatory and respiratory systems: Secondary | ICD-10-CM

## 2019-10-18 DIAGNOSIS — R7309 Other abnormal glucose: Secondary | ICD-10-CM

## 2019-10-18 DIAGNOSIS — I749 Embolism and thrombosis of unspecified artery: Secondary | ICD-10-CM | POA: Diagnosis not present

## 2019-10-18 DIAGNOSIS — I10 Essential (primary) hypertension: Secondary | ICD-10-CM | POA: Diagnosis not present

## 2019-10-18 LAB — GLUCOSE, CAPILLARY
Glucose-Capillary: 117 mg/dL — ABNORMAL HIGH (ref 70–99)
Glucose-Capillary: 132 mg/dL — ABNORMAL HIGH (ref 70–99)
Glucose-Capillary: 110 mg/dL — ABNORMAL HIGH (ref 70–99)
Glucose-Capillary: 130 mg/dL — ABNORMAL HIGH (ref 70–99)

## 2019-10-18 MED ORDER — RESOURCE THICKENUP CLEAR PO POWD
ORAL | Status: DC | PRN
  Filled 2019-10-18: qty 125

## 2019-10-18 NOTE — Care Management (Signed)
[facility] Individual Statement of Services  Patient Name:  Brittney Kennedy  Date:  10/18/2019  Welcome to the [facility].  Our goal is to provide you with an individualized program based on your diagnosis and situation, designed to meet your specific needs.  With this comprehensive rehabilitation program, you will be expected to participate in at least 3 hours of rehabilitation therapies Monday-Friday, with modified therapy programming on the weekends.  Your rehabilitation program will include the following services:  Physical Therapy (PT), Occupational Therapy (OT), Speech Therapy (ST), 24 hour per day rehabilitation nursing, Therapeutic Recreaction (TR), Neuropsychology, Case Management (Social Worker), Rehabilitation Medicine, Nutrition Services, Pharmacy Services and Other  Weekly team conferences will be held on Wednesdays to discuss your progress.  Your Social Worker will talk with you frequently to get your input and to update you on team discussions.  Team conferences with you and your family in attendance may also be held.  Expected length of stay: 3-4 Weeks  Overall anticipated outcome: Min assit to supervison  Depending on your progress and recovery, your program may change. Your Social Worker will coordinate services and will keep you informed of any changes. Your Social Worker's name and contact numbers are listed  below.  The following services may also be recommended but are not provided by the In[facility]:    Home Health Rehabiltiation Services  Outpatient Rehabilitation Services    Arrangements will be made to provide these services after discharge if needed.  Arrangements include referral to agencies that provide these services.  Your insurance has been verified to be: Medicare  Your primary doctor is:  Julien Nordmann  Pertinent information will be shared with your doctor and your insurance company.  Social  Worker:  Lavera Guise, [place] [phone] or ([phone]   Information discussed with and copy given to patient by: Andria Rhein, 10/18/2019, 12:40 PM

## 2019-10-18 NOTE — Progress Notes (Signed)
Social Work Assessment and Plan   Patient Details  Name: Brittney Kennedy MRN: [id number] Date of Birth: 03/21/1951  Today's Date: 10/18/2019  Problem List:  Patient Active Problem List   Diagnosis Date Noted  . Hypomagnesemia   . Labile blood pressure   . Labile blood glucose   . Acute systolic congestive heart failure ([place])   . Essential hypertension   . Keratoconjunctivitis   . Right middle cerebral artery stroke ([place]) 10/16/2019  . Embolic infarction ([place]) 10/16/2019  . Prediabetes   . Benign essential HTN   . Dysphagia, post-stroke   . Duodenal ulcer   . Acute blood loss anemia 10/08/2019  . Upper GI bleed   . Status post coronary artery stent placement   . Malnutrition of moderate degree 10/02/2019  . Cerebral embolism with cerebral infarction 10/01/2019  . CAD (coronary artery disease) 09/27/2019  . Acute systolic CHF (congestive heart failure) ([place]) 09/27/2019  . NSTEMI (non-ST elevated myocardial infarction) ([place]) 09/26/2019   Past Medical History:  Past Medical History:  Diagnosis Date  . CAD (coronary artery disease)    a. 09/2019 NSTEMI/Cath: LM nl, LAD 90p, 17m diffuse mid-dist dzs, LCX 985mV groove/OM2, RI 90p, RCA dominant, nl, RPDA 9072mF 25%, glob HK.  . HMarland KitchenrEF (heart failure with reduced ejection fraction) [place]  a. 09/2019 LV gram: EF 25%, global HK.  . PMarland KitchenD (peptic ulcer disease)   . Seasonal allergies    Past Surgical History:  Past Surgical History:  Procedure Laterality Date  . CHOLECYSTECTOMY    . CORONARY STENT INTERVENTION N/A 09/30/2019   Procedure: CORONARY STENT INTERVENTION;  Surgeon: JorMartiniqueeter M, MD;  Location: [facility];  Service: Cardiovascular;  Laterality: N/A;  . ESOPHAGOGASTRODUODENOSCOPY (EGD) WITH PROPOFOL N/A 10/08/2019   Procedure: ESOPHAGOGASTRODUODENOSCOPY (EGD) WITH PROPOFOL;  Surgeon: ArmYetta FlockD;  [facility];  Laterality: N/A;  . HEMOSTASIS CLIP PLACEMENT   10/08/2019   Procedure: HEMOSTASIS CLIP PLACEMENT;  Surgeon: ArmYetta FlockD;  [facility];;  . HEMOSTASIS CONTROL  10/08/2019   Procedure: HEMOSTASIS CONTROL;  Surgeon: ArmYetta FlockD;  [facility];;  . LEFT HEART CATH AND CORONARY ANGIOGRAPHY N/A 09/30/2019   Procedure: LEFT HEART CATH AND CORONARY ANGIOGRAPHY;  Surgeon: JorMartiniqueeter M, MD;  Location: [facility];  Service: Cardiovascular;  Laterality: N/A;  . LEFT HEART CATH AND CORS/GRAFTS ANGIOGRAPHY N/A 09/27/2019   Procedure: LEFT HEART CATH AND CORS/GRAFTS ANGIOGRAPHY;  Surgeon: GolMinna MerrittsD;  [facility];  Service: Cardiovascular;  Laterality: N/A;   Social History:  reports that she has never smoked. She has never used smokeless tobacco. She reports that she does not drink alcohol or use drugs.  Family / Support Systems Marital Status: Divorced Children: 1 son Other Supports: Daughter in lawSports coachlling to assit Anticipated Caregiver: Possibly son. He works at a hospital Ability/Limitations of Caregiver: Works nights Caregiver Availability: Intermittent  Social History Preferred language: English Religion:  Education: GraAdministrator, artsad: Yes Write: Yes Employment Status: Retired Age Retired: 28 LegPublic relations account executivesues: n/a   Abuse/Neglect Abuse/Neglect Assessment Can Be Completed: Yes Physical Abuse: Denies Verbal Abuse: Denies Sexual Abuse: Denies Exploitation of patient/patient's resources: Denies Self-Neglect: Denies  Emotional Status Pt's affect, behavior and adjustment status: Patient still trying to come to terms with her having a stroke  Patient / Family Perceptions, Expectations & Goals Pt/Family understanding  of illness & functional limitations: Yes Pt/family expectations/goals: Goal to return home  [employer]: None Premorbid Home  Care/DME Agencies: None Transportation available at discharge: Son able to to transport at discharge Resource referrals recommended: Neuropsychology  Discharge Planning Living Arrangements: Non-relatives/Friends Support Systems: Children, Other relatives Type of Residence: Private residence Insurance Resources: Chartered certified accountant Resources: [place] Referred: Yes Living Expenses: Rent Money Management: Patient Does the patient have any problems obtaining your medications?: No [place] Barriers to Discharge: Lack of/limited family support Social Work Anticipated Follow Up Needs: HH/OP Expected length of stay: 3-4 Weeks  Clinical Impression SW entered room to complete assessment. Tech at bedside.  met patient, introduced self, explained role. SW called son while in room to give same information with patient.  Dyanne Iha 10/18/2019, 1:14 PM

## 2019-10-18 NOTE — Progress Notes (Signed)
Physical Therapy Session Note  Patient Details  Name: Brittney Kennedy MRN: [id number] Date of Birth: 05/06/1951  Today's Date: 10/18/2019 PT Individual Time: 4098-1191 PT Individual Time Calculation (min): 60 min   Short Term Goals: Week 1:  PT Short Term Goal 1 (Week 1): Pt will perform supine<>sit with max assist of 1 PT Short Term Goal 2 (Week 1): Pt will perform sit<>stands with max A of 1 PT Short Term Goal 3 (Week 1): Pt will perform bed<>chair transfers with max assist of 1  Skilled Therapeutic Interventions/Progress Updates:    Pt received supine, asleep in bed with her son, [name], and best friend, Harriett Sine, present. Upon awakening pt agreeable to therapy session. Supine>sitting R EOB with max assist of 1 for L hemibody management and trunk upright - max multimodal cuing for sequencing. Sitting EOB pt's son shows her pajamas with pt demonstrating ability to orient L of midline for ~20seconds to read phrase on shirt (much improved from yesterday). L squat pivot transfer to w/c with +2 max assist for lifting/pivoting hips, L UE and LE management, and trunk control - max multimodal cuing for sequencing. Pt requests to use bathroom. Transported in/out of bathroom in w/c. Stand pivot transfer w/c<>toilet using R UE support on grab bar and +2 max assist for balance, pivoting hips, and blocking L knee buckle. Standing with R UE support on grab bar and max assist of 1 for balance and blocking L knee buckle (max cuing for increase trunk/hip/extension for improved upright posture) while +2 performed total assist LB clothing management. Continent of bladder and performed seated peri-care with min assist to grab toilet paper. Seated hand hygiene with max assist for integration of L UE into task.  Transported to/from gym in w/c for time management and energy conservation. Squat pivot transfer w/c<>EOM with +2 max assist for lifting/pivoting hips and trunk control as well as L hemibody management. Sitting EOM  performed task targeting L UE NMR, L attention, and trunk control via L lateral trunk lean onto forearm WBing while visually scanning to locate bean bag, grasp with R hand and then return to upright in midline to place bean bag on R side - max/total assist for LUE management/positoining and max/total assist for trunk control due to L LOB. Pt demonstrates some activation in L UE to support trunk in sitting. Pt started to report some L hip discomfort/pain during this task therefore transitioned to sitting more upright with L UE support while completing same task. Continues to report some L hip discomfort therefore sit>supine with mod assist for trunk descent and L hemibody management - reports decreased pain after short supine rest break. Supine>sit with mod/max assist for L hemibody management and trunk upright. Pt requests for assistance changing into pajamas prior to returning to bed therefore transported back to room in w/c. R squat pivot to EOB with +2 max assist as described above. Sitting EOB completed UB dressing with max assist and max cuing for sequencing - max/total assist for trunk control when L lateral LOB occurred. Sit>supine with mod assist of 1 primarily for L hemibody management and max cuing for increased pt independence. Supine in bed pt able to bridge adequately to get pants on/off hips - donned pajama pants with max assist. Performed x7 reps bridging with manual facilitation/tactile cuing for increased L glute muscle activation. Pt left supine in bed with needs in reach, bed alarm on, and pt's family arriving.   Therapy Documentation Precautions:  Precautions Precautions: Fall, Other (comment) Precaution Comments:  aspiration precautions (HOB >30 degrees), significant L inattention/neglect, signifcant L hemiparesis (UE>LE with L shoulder subluxation) Restrictions Weight Bearing Restrictions: No  Pain: Reports some L hip pain during seated trunk control task - modified task for pain  management.   Therapy/Group: Individual Therapy  Tawana Scale, PT, DPT 10/18/2019, 2:58 PM

## 2019-10-18 NOTE — Progress Notes (Signed)
Occupational Therapy Session Note  Patient Details  Name: Brittney Kennedy MRN: [id number] Date of Birth: 09/18/1950  Today's Date: 10/18/2019 OT Individual Time: 4627-0350 (1230-1330 unattended estim) OT Individual Time Calculation (min): 75 min    Short Term Goals: Week 1:  OT Short Term Goal 1 (Week 1): Pt will be able to use L hand as a stabilizing A with UB bathing. OT Short Term Goal 2 (Week 1): Pt will be able to find items on her food tray on L side with min cues. OT Short Term Goal 3 (Week 1): Pt will complete squat pivot to toilet with mod A. OT Short Term Goal 4 (Week 1): Pt will don a shirt with mod A. OT Short Term Goal 5 (Week 1): Pt will rise to stand with mod A.  Skilled Therapeutic Interventions/Progress Updates:    Saebo stim one applied to L deltoid for cyclic estim unattended for 60 min to reduce subluxation. Pt tolerated well Saebo Stim One 330 pulse width 35 Hz pulse rate On 8 sec/ off 8 sec Ramp up/ down 2 sec Symmetrical Biphasic wave form  Max intensity 130m at 500 Ohm load  Estim removed at start of session. Pt had just completed toileting with nurse tech using stedy and was very tired but agreeable to therapy.  Taken to gym and focused on:  -Squat pivot transfers to and from mat and then to bed all with mod A (2nd person present for guiding A) -LUE wt bearing on mat while pt working on L visual scanning activity with MAX cues to scan to L and to keep focused on task as she has very limited attention span -cues with peg board activity as pt demonstrating sev imp spatial relations and attention to task -hand over hand guiding to facilitate LUE AROM with towel table top slides, UE ranger, and removing pegs from board.  Max cues to look at hand and attend to L side. Pt has a strong gaze preference.  On mat, pt was able to maintain sitting balance well with static sit.  Needed facilitation through torso when visually scanning to L as pt leans to left.   Pt's son  arrived towards end of session.   Pt transferred back to bed as she was exhausted and had difficulty staying awake. Her son assisted with getting her adjusted in bed. Alarm set and all needs met.  Therapy Documentation Precautions:  Precautions Precautions: Fall, Other (comment) Precaution Comments: aspiration precautions (HOB >30 degrees), significant L inattention/neglect, signifcant L hemiparesis (UE>LE with L shoulder subluxation) Restrictions Weight Bearing Restrictions: No  Pain:no co pain   ADL: ADL Grooming: Minimal assistance Where Assessed-Grooming: Sitting at sink Upper Body Bathing: Moderate assistance Where Assessed-Upper Body Bathing: Shower Lower Body Bathing: Maximal assistance Where Assessed-Lower Body Bathing: Shower Upper Body Dressing: Maximal assistance Where Assessed-Upper Body Dressing: Wheelchair Lower Body Dressing: Dependent Where Assessed-Lower Body Dressing: Wheelchair, Bed level Toileting: Not assessed Toilet Transfer: Not assessed WSocial research officer, government Maximal assistance WSocial research officer, governmentMethod: SManagement consultant Shower seat with back, Grab bars   Therapy/Group: Individual Therapy  SEaton4/16/2021, 11:07 AM

## 2019-10-18 NOTE — Progress Notes (Signed)
Brittney Kennedy  Subjective/Complaints: Patient seen sitting up in bed this morning eating breakfast.  She states she had a good first day of therapies yesterday.  ROS: + Eye pain.  Appears to deny CP, shortness of breath, nausea, vomiting, diarrhea.  Objective: Vital Signs: Blood pressure (!) 150/60, pulse 78, temperature 98.3 F (36.8 C), resp. rate 17, height 5\' 2"  (1.575 m), weight 70.2 kg, SpO2 99 %. No results found. Recent Labs    10/17/19 0516  WBC 7.4  HGB 9.1*  HCT 29.2*  PLT 286   Recent Labs    10/16/19 0401 10/17/19 0516  NA 138 139  K 3.8 3.8  CL 104 105  CO2 22 23  GLUCOSE 122* 119*  BUN 8 8  CREATININE 0.79 0.86  CALCIUM 9.4 9.3    Physical Exam: BP (!) 150/60   Pulse 78   Temp 98.3 F (36.8 C)   Resp 17   Ht 5\' 2"  (1.575 m)   Wt 70.2 kg   SpO2 99%   BMI 28.31 kg/m  Constitutional: No distress . Vital signs reviewed. HENT: Normocephalic.  Atraumatic. Eyes: Tends to keep eyes closed due to pain, but EOMI. No discharge. Cardiovascular: No JVD. Respiratory: Normal effort.  No stridor. GI: Non-distended. Skin: Warm and dry.  Intact. Psych: Labile mood. Musc: No edema in extremities.  No tenderness in extremities. Neurological: Alert Dysarthria Facial weakness Examination limited due to participation  Motor: LUE: ? 0/5 proximal to distal, unchanged RLE: 4+/5 proximal to distal LLE: HF, KE 2/5, ADF 4/5  Assessment/Plan: 1. Functional deficits secondary to bilateral embolic infarcts.  Which require 3+ hours per day of interdisciplinary therapy in a comprehensive inpatient rehab setting.  Physiatrist is providing close team supervision and 24 hour management of active medical problems listed below.  Physiatrist and rehab team continue to assess barriers to discharge/monitor patient progress toward functional and medical goals  Care Tool:  Bathing    Body parts bathed by patient: Chest,  Abdomen, Front perineal area, Right upper leg, Left upper leg, Face   Body parts bathed by helper: Buttocks, Right lower leg, Left lower leg, Right arm, Left arm     Bathing assist Assist Level: Moderate Assistance - Patient 50 - 74%     Upper Body Dressing/Undressing Upper body dressing   What is the patient wearing?: Pull over shirt    Upper body assist Assist Level: Moderate Assistance - Patient 50 - 74%    Lower Body Dressing/Undressing Lower body dressing      What is the patient wearing?: Pants, Incontinence brief     Lower body assist Assist for lower body dressing: Maximal Assistance - Patient 25 - 49%     Toileting Toileting    Toileting assist Assist for toileting: Maximal Assistance - Patient 25 - 49%     Transfers Chair/bed transfer  Transfers assist     Chair/bed transfer assist level: 2 Helpers     Locomotion Ambulation   Ambulation assist      Assist level: 2 helpers(max A 1 and +2 w/c follow) Assistive device: Other (comment)(R hallway rail) Max distance: 75ft   Walk 10 feet activity   Assist     Assist level: 2 helpers(max A 1 and +2 w/c follow) Assistive device: Other (comment)(R hallway rail)   Walk 50 feet activity   Assist Walk 50 feet with 2 turns activity did not occur: Safety/medical concerns  Walk 150 feet activity   Assist Walk 150 feet activity did not occur: Safety/medical concerns         Walk 10 feet on uneven surface  activity   Assist Walk 10 feet on uneven surfaces activity did not occur: Safety/medical concerns         Wheelchair     Assist Will patient use wheelchair at discharge?: (TBD)             Wheelchair 50 feet with 2 turns activity    Assist            Wheelchair 150 feet activity     Assist            Medical Problem List and Plan: 1.  Altered mental status left hemiparesis secondary to right MCA infarction along with a small left CR and  splenium infarct embolic pattern likely due to transient cardiomyopathy with low EF after cardiac catheterization for non-STEMI with stenting complicated by groin hematoma  Continue CIR 2.  Antithrombotics: -DVT/anticoagulation: SCDs             -antiplatelet therapy: Plavix 3. Pain Management: Tylenol as needed 4. Mood: Amantadine 100 mg twice daily, melatonin 3 mg nightly             -antipsychotic agents: N/A 5. Neuropsych: This patient is not capable of making decisions on her own behalf. 6. Skin/Wound Care: Routine skin checks 7. Fluids/Electrolytes/Nutrition: Routine in and outs.  8.  GI bleed/duodenal ulcer.  Status post EGD 10/08/2019 with epi and hemostasis clips x5.  PPI twice daily             See #9 9.  Acute blood loss anemia.    Hemoglobin 9.1 on 4/15  Continue to monitor 10.  AKI.  Resolved.  11.  Acute urinary tract infection.  Patient completed 5-day course amoxicillin 10/17/2019 12.  Post stroke dysphagia.  Dysphagia #1 nectars liquids.  Follow-up speech therapy             Advance diet as tolerated 13.  Keratoconjunctivitis.  Follow-up ophthalmology services.    Erythromycin 4 times a day x7 days initiated 10/12/19. 14.  Hypertension.  Coreg 3.125 mg twice daily.    Labile on 4/16, monitor trend             Monitor with increased mobility 15.  Hyperlipidemia.  Lipitor             Completed 16.  Prediabetes.  Hemoglobin A1c 5.9.  SSI  Slightly labile on 4/16, monitor for trend             Monitor with increased mobility 17. Acute systolic CHF   Filed Weights   10/16/19 1900 10/17/19 0500 10/18/19 0301  Weight: 70.4 kg 69.8 kg 70.2 kg   Stable on 4/16 18. Hypomagnesemia  Mag 1.8 on 4/14  Will order labs next week  LOS: 2 days A FACE TO FACE EVALUATION WAS PERFORMED  Brittney Kennedy Lorie Phenix 10/18/2019, 10:26 AM

## 2019-10-18 NOTE — Telephone Encounter (Signed)
-----   Message -----  From: Benancio Deeds, MD  Sent: 10/11/2019  9:25 AM EDT  To: Cooper Render, CMA  Subject: outpatient follow up               Beverely Low,  This patient will need outpatient follow up with me in 1-2 months, nonurgent. Maybe we can scheduler her in the next 1-2 weeks, I don't think she will be leaving the hospital soon but wanted to put on your radar. Thanks! Hope you are having a good week.

## 2019-10-18 NOTE — Progress Notes (Signed)
Modified Barium Swallow Progress Note  Patient Details  Name: Brittney Kennedy MRN: [id number] Date of Birth: February 03, 1951  Today's Date: 10/18/2019  Modified Barium Swallow completed.  Full report located under Chart Review in the Imaging Section.  Brief recommendations include the following:  Clinical Impression  Pt continues to demonstrates a primarily sensory based pharyngeal dysphagia with mild oral phase deficits noted. Orally pt exhibited left anterior spillage with thin liquids via cup. Overall she was efficient with bolus cohesion and improved posterior propulsion. She continues to exhibit a delay to pyriform sinuses with both nectar thick liquids and thin liquids. When consuming larger thin liquid boluses via cup or straw, pt consistently penetrates all boluses and aspiration x 2. Pt exhibited delayed cough after aspiration events but cough was too delayed to clear aspirates. Pt was not able to effectively replicate small sips d/t cognitive deficits. Pt's positioning also frequently changes d/t restlessness and attention deficits. Penetration and aspiration were prevented with left head turn. Pt unable to perform without SLP holding cup and straw close to pt's left shoulder when consuming. At this time, recommend pt continue dysphagia 1 diet with nectar thick liquids, medicine whole in puree, full supervision, water protocol with left head turn targeted within skilled ST sessions.   Swallow Evaluation Recommendations       SLP Diet Recommendations: Dysphagia 1 (Puree) solids;Nectar thick liquid   Liquid Administration via: Cup   Medication Administration: Whole meds with puree   Supervision: Staff to assist with self feeding;Full supervision/cueing for compensatory strategies   Compensations: Minimize environmental distractions;Slow rate;Small sips/bites   Postural Changes: Seated upright at 90 degrees   Oral Care Recommendations: Oral care BID   Other Recommendations: Order  thickener from pharmacy;Prohibited food (jello, ice cream, thin soups);Remove water pitcher;Have oral suction available;Clarify dietary restrictions    Brandom Kerwin 10/18/2019,1:53 PM

## 2019-10-18 NOTE — Progress Notes (Signed)
Patient seemed a little anxious with staff. Son called to nurses station stating that his mom keeps saying she wants him there and does not fully understand that he can't be there  Physically 24/7. He also states his mom seems a little depressed and will like to get neuropsychology involved. He is the healthcare power of attorney and has requested a consult. Jesusita Oka, PA aware and okay for order for consult and social worker also aware. Will continue plan of care.  Jay Schlichter, LPN

## 2019-10-18 NOTE — Progress Notes (Signed)
Speech Language Pathology Daily Session Note  Patient Details  Name: Brittney Kennedy MRN: [id number] Date of Birth: [birth year]  Today's Date: 10/18/2019 SLP Individual Time: 1105-1130 SLP Individual Time Calculation (min): 25 min  Short Term Goals: Week 1: SLP Short Term Goal 1 (Week 1): Pt will sustain attention to basic familiar task for ~ 3 minutes with Mod A cues. SLP Short Term Goal 2 (Week 1): With Mod A cues, pt will utilize speech intelligibilty strategies to increase intelligibility to ~ 75% at simple phrase level. SLP Short Term Goal 3 (Week 1): Pt will consume least restrictive diet with minimal overt s/s of aspiration or dysphagia with Min A cues for swallow strategies.  Skilled Therapeutic Interventions: Skilled treatment session targeted pt's cognition and dysphagia goals. SLP facilitated session by providing Maximal verbal and tactile cues to follow directions and sustain attention to directions (d/t off-topic comments) during bed mobility/repositioning. Additionally, despite Total assistance, pt unable to reproduce strategy of looking to her left, taking a sip of thin liquid via straw (cup and straw held by SLP), maintaining this position and then swallowing. Pt took sip, returned to midline and then swallowed. As a result, she exhibited delayed coughing with each sip. Pt was not successful with this strategy despite total multimodal cues. Pt left upright in bed, bed alarm on and all needs within reach. Pt left upright in bed, bed alarm on and all needs within reach. Continue per current plan of care.      Pain Pain Assessment Pain Scale: 0-10 Pain Score: 4   Therapy/Group: Individual Therapy  Emmamae Mcnamara 10/18/2019, 1:57 PM

## 2019-10-18 NOTE — Progress Notes (Signed)
Occupational Therapy Session Note  Patient Details  Name: Brittney Kennedy MRN: [id number] Date of Birth: September 03, 1950  Today's Date: 10/18/2019 OT Individual Time: 6433-2951 OT Individual Time Calculation (min): 34 min    Short Term Goals: Week 1:  OT Short Term Goal 1 (Week 1): Pt will be able to use L hand as a stabilizing A with UB bathing. OT Short Term Goal 2 (Week 1): Pt will be able to find items on her food tray on L side with min cues. OT Short Term Goal 3 (Week 1): Pt will complete squat pivot to toilet with mod A. OT Short Term Goal 4 (Week 1): Pt will don a shirt with mod A. OT Short Term Goal 5 (Week 1): Pt will rise to stand with mod A.  Skilled Therapeutic Interventions/Progress Updates:    Pt in bed to start session, so worked on supine to sit EOB with mod assist.  She demonstrated LOB to the left with initial static sitting, requiring mod assist to correct.  She was able to demonstrate increased improvement in static sitting to min guard after a minute or so.  Worked on donning her pants from the EOB.  Mod instructional cueing with overall max assist to donn them following hemi dressing techniques.  She was able to the work on donning her gripper socks with overall mod assist using one handed technique.  Stand pivot transfer was completed to the right with max assist as well with pt requesting to complete oral hygiene next.  Therapist rolled her to the wheelchair and assisted with setup of toothbrush secondary to time limitations.  She then completed the task with supervision.  Left pt in the wheelchair with call button and phone in reach and safety belt in place.    Therapy Documentation Precautions:  Precautions Precautions: Fall, Other (comment) Precaution Comments: aspiration precautions (HOB >30 degrees), significant L inattention/neglect, signifcant L hemiparesis (UE>LE with L shoulder subluxation) Restrictions Weight Bearing Restrictions: No  Pain: Pt with report of  some right hip pain but no number given  ADL: See Care Tool Section for some details of mobility and selfcare  Therapy/Group: Individual Therapy  Telia Amundson OTR/L 10/18/2019, 4:16 PM

## 2019-10-19 ENCOUNTER — Inpatient Hospital Stay (HOSPITAL_COMMUNITY): Payer: Medicare Other | Admitting: Occupational Therapy

## 2019-10-19 ENCOUNTER — Inpatient Hospital Stay (HOSPITAL_COMMUNITY): Payer: Medicare Other | Admitting: Speech Pathology

## 2019-10-19 ENCOUNTER — Inpatient Hospital Stay (HOSPITAL_COMMUNITY): Payer: Medicare Other

## 2019-10-19 DIAGNOSIS — I749 Embolism and thrombosis of unspecified artery: Secondary | ICD-10-CM | POA: Diagnosis not present

## 2019-10-19 LAB — GLUCOSE, CAPILLARY
Glucose-Capillary: 118 mg/dL — ABNORMAL HIGH (ref 70–99)
Glucose-Capillary: 180 mg/dL — ABNORMAL HIGH (ref 70–99)
Glucose-Capillary: 126 mg/dL — ABNORMAL HIGH (ref 70–99)
Glucose-Capillary: 148 mg/dL — ABNORMAL HIGH (ref 70–99)

## 2019-10-19 NOTE — Progress Notes (Signed)
[facility] PHYSICAL MEDICINE & REHABILITATION PROGRESS NOTE  Subjective/Complaints:   Pt reports trouble sleeping per pt (according to her son, she has no difficulty falling asleep) Says cannot sleep due to noise.   LBM- she thinks was yesterday- doesn't really feel constipated.  R eye is still itching, but denies pain in it-does keep it closed per son/pt..   ROS:  Pt denies SOB, abd pain, CP, N/V/C/D, and vision changes   Objective: Vital Signs: Blood pressure (!) 164/98, pulse 79, temperature 98.4 F (36.9 C), temperature source Oral, resp. rate 16, height 5\' 2"  (1.575 m), weight 70.2 kg, SpO2 98 %. DG Swallowing Func-Speech Pathology  Result Date: 10/18/2019 Objective Swallowing Evaluation: Type of Study: MBS-Modified Barium Swallow Study  Patient Details Name: Brittney Kennedy MRN: [id number] Date of Birth: Jan 14, 1951 Today's Date: 10/18/2019 Time: SLP Start Time (ACUTE ONLY): 1115 -SLP Stop Time (ACUTE ONLY): 1125 SLP Time Calculation (min) (ACUTE ONLY): 10 min Past Medical History: Past Medical History: Diagnosis Date . CAD (coronary artery disease)   a. 09/2019 NSTEMI/Cath: LM nl, LAD 90p, 58m, diffuse mid-dist dzs, LCX 24m AV groove/OM2, RI 90p, RCA dominant, nl, RPDA 51m, EF 25%, glob HK. Marland Kitchen HFrEF (heart failure with reduced ejection fraction) ([place])   a. 09/2019 LV gram: EF 25%, global HK. Marland Kitchen PUD (peptic ulcer disease)  . Seasonal allergies  Past Surgical History: Past Surgical History: Procedure Laterality Date . CHOLECYSTECTOMY   . CORONARY STENT INTERVENTION N/A 09/30/2019  Procedure: CORONARY STENT INTERVENTION;  Surgeon: Martinique, Peter M, MD;  Location: [facility];  Service: Cardiovascular;  Laterality: N/A; . ESOPHAGOGASTRODUODENOSCOPY (EGD) WITH PROPOFOL N/A 10/08/2019  Procedure: ESOPHAGOGASTRODUODENOSCOPY (EGD) WITH PROPOFOL;  Surgeon: Yetta Flock, MD;  Location: [facility];  Service: Gastroenterology;  Laterality: N/A; . HEMOSTASIS CLIP PLACEMENT  10/08/2019  Procedure:  HEMOSTASIS CLIP PLACEMENT;  Surgeon: Yetta Flock, MD;  Location: [facility];  Service: Gastroenterology;; . HEMOSTASIS CONTROL  10/08/2019  Procedure: HEMOSTASIS CONTROL;  Surgeon: Yetta Flock, MD;  Location: [facility];  Service: Gastroenterology;; . LEFT HEART CATH AND CORONARY ANGIOGRAPHY N/A 09/30/2019  Procedure: LEFT HEART CATH AND CORONARY ANGIOGRAPHY;  Surgeon: Martinique, Peter M, MD;  Location: [facility];  Service: Cardiovascular;  Laterality: N/A; . LEFT HEART CATH AND CORS/GRAFTS ANGIOGRAPHY N/A 09/27/2019  Procedure: LEFT HEART CATH AND CORS/GRAFTS ANGIOGRAPHY;  Surgeon: Minna Merritts, MD;  Location: [facility];  Service: Cardiovascular;  Laterality: N/A; HPI: 69 y/o female with hx of CAD, cholecystectomy, presented to ED with nausea/vomiting, epigastric pain for 1 week intermittently.  Admitted for NSTEMI, cath 3/26 revealed severe multivessel CAD and transferred to [facility].  Fall 3/27 with R groin hematoma.  Found AMS 3/28 due to pain meds.  Rapid response 3/29 due to AMS and L facial droop/weakness. CT revealed right basal ganglia infarct.  3/29 pm with worsening deficits; STAT CT head with right parietal infarct.  Subjective: frequently needed cues to keep head still during exam Assessment / Plan / Recommendation CHL IP CLINICAL IMPRESSIONS 10/18/2019 Clinical Impression Pt continues to demonstrates a primarily sensory based pharyngeal dysphagia with mild oral phase deficits noted. Orally pt exhibited left anterior spillage iwth thin liquids via cup. Overall she was effecient with bolus cohesion and improved posterior propulsion. She continues to exhibit a delay to pyriform sinuses with both nectar thick liquids and thin liquids. When consuming larger thin liquid boluses via cup or straw, pt consistently penetrates all boluses and aspiration x 2. Pt exhibited delayed cough  after aspriation events but cough was too delayed to clear aspirates. Pt was not able to  effectively replicate small sips d/t cognitive deficits. Pt's positioning also freqeuntly changes d/t restlessness and attention deficits. Penetration and aspiraiton were prevented with left head turn. Pt unable to perform without SLP holding cup and straw close to pt's left shoulder when consuming. At this time, recommend pt continue dysphagia 1 diet with nectar thick liquids, medicine whole in puree, full supervision, water protocal with left head turn targeted within skilled ST sessions. SLP Visit Diagnosis Dysphagia, oropharyngeal phase (R13.12) Attention and concentration deficit following -- Frontal lobe and executive function deficit following -- Impact on safety and function Mild aspiration risk   CHL IP TREATMENT RECOMMENDATION 10/18/2019 Treatment Recommendations Therapy as outlined in treatment plan below   Prognosis 10/05/2019 Prognosis for Safe Diet Advancement Good Barriers to Reach Goals Severity of deficits Barriers/Prognosis Comment -- CHL IP DIET RECOMMENDATION 10/18/2019 SLP Diet Recommendations Dysphagia 1 (Puree) solids;Nectar thick liquid Liquid Administration via Cup Medication Administration Whole meds with puree Compensations Minimize environmental distractions;Slow rate;Small sips/bites Postural Changes Seated upright at 90 degrees   CHL IP OTHER RECOMMENDATIONS 10/18/2019 Recommended Consults -- Oral Care Recommendations Oral care BID Other Recommendations Order thickener from pharmacy;Prohibited food (jello, ice cream, thin soups);Remove water pitcher;Have oral suction available;Clarify dietary restrictions   CHL IP FOLLOW UP RECOMMENDATIONS 10/18/2019 Follow up Recommendations Inpatient Rehab   CHL IP FREQUENCY AND DURATION 10/05/2019 Speech Therapy Frequency (ACUTE ONLY) min 3x week Treatment Duration 2 weeks      CHL IP ORAL PHASE 10/18/2019 Oral Phase Impaired Oral - Pudding Teaspoon -- Oral - Pudding Cup -- Oral - Honey Teaspoon -- Oral - Honey Cup -- Oral - Nectar Teaspoon -- Oral - Nectar  Cup -- Oral - Nectar Straw -- Oral - Thin Teaspoon -- Oral - Thin Cup Left anterior bolus loss;Decreased bolus cohesion;Premature spillage Oral - Thin Straw Decreased bolus cohesion;Premature spillage Oral - Puree NT Oral - Mech Soft Weak lingual manipulation Oral - Regular -- Oral - Multi-Consistency -- Oral - Pill NT Oral Phase - Comment --  CHL IP PHARYNGEAL PHASE 10/18/2019 Pharyngeal Phase Impaired Pharyngeal- Pudding Teaspoon -- Pharyngeal -- Pharyngeal- Pudding Cup -- Pharyngeal -- Pharyngeal- Honey Teaspoon NT Pharyngeal -- Pharyngeal- Honey Cup NT Pharyngeal -- Pharyngeal- Nectar Teaspoon NT Pharyngeal -- Pharyngeal- Nectar Cup Delayed swallow initiation-pyriform sinuses Pharyngeal -- Pharyngeal- Nectar Straw NT Pharyngeal -- Pharyngeal- Thin Teaspoon NT Pharyngeal -- Pharyngeal- Thin Cup Delayed swallow initiation-pyriform sinuses;Penetration/Aspiration during swallow Pharyngeal Material enters airway, passes BELOW cords without attempt by patient to eject out (silent aspiration) Pharyngeal- Thin Straw Delayed swallow initiation-pyriform sinuses;Penetration/Aspiration during swallow Pharyngeal Material enters airway, remains ABOVE vocal cords and not ejected out Pharyngeal- Puree NT Pharyngeal -- Pharyngeal- Mechanical Soft WFL Pharyngeal -- Pharyngeal- Regular -- Pharyngeal -- Pharyngeal- Multi-consistency -- Pharyngeal -- Pharyngeal- Pill NT Pharyngeal -- Pharyngeal Comment --  CHL IP CERVICAL ESOPHAGEAL PHASE 10/18/2019 Cervical Esophageal Phase WFL Pudding Teaspoon -- Pudding Cup -- Honey Teaspoon -- Honey Cup -- Nectar Teaspoon -- Nectar Cup -- Nectar Straw -- Thin Teaspoon -- Thin Cup -- Thin Straw -- Puree -- Mechanical Soft -- Regular -- Multi-consistency -- Pill -- Cervical Esophageal Comment -- Happi Overton 10/18/2019, 1:54 PM              Recent Labs    10/17/19 0516  WBC 7.4  HGB 9.1*  HCT 29.2*  PLT 286   Recent Labs    10/17/19 0516  NA  139  K 3.8  CL 105  CO2 23  GLUCOSE 119*   BUN 8  CREATININE 0.86  CALCIUM 9.3    Physical Exam: BP (!) 164/98 (BP Location: Right Arm)   Pulse 79   Temp 98.4 F (36.9 C) (Oral)   Resp 16   Ht 5\' 2"  (1.575 m)   Wt 70.2 kg   SpO2 98%   BMI 28.31 kg/m  Constitutional: No distress . Vital signs reviewed. HENT: Normocephalic.  Atraumatic. Eyes: R eye conjunctiva slightly pink, but keeps eyes closed unless asked to open it- no discharge seen on either eye Cardiovascular: RRR- no JVD Respiratory: CTA B/L- no W/R/R- good air movement GI: Soft, NT, ND, (+)BS  Skin: Warm and dry.  Intact.no skin breakdown seen Psych: calm, but son kept her calm by redirection Musc: No edema in extremities.  No tenderness in extremities. Neurological: overall alert Dysarthria Facial weakness Examination limited due to participation  Motor: LUE: ? 0/5 proximal to distal, unchanged RLE: 4+/5 proximal to distal LLE: HF, KE 2/5, ADF 4/5  Assessment/Plan: 1. Functional deficits secondary to bilateral embolic infarcts.  Which require 3+ hours per day of interdisciplinary therapy in a comprehensive inpatient rehab setting.  Physiatrist is providing close team supervision and 24 hour management of active medical problems listed below.  Physiatrist and rehab team continue to assess barriers to discharge/monitor patient progress toward functional and medical goals  Care Tool:  Bathing    Body parts bathed by patient: Chest, Abdomen, Front perineal area, Right upper leg, Left upper leg, Face   Body parts bathed by helper: Buttocks, Right lower leg, Left lower leg, Right arm, Left arm     Bathing assist Assist Level: Moderate Assistance - Patient 50 - 74%     Upper Body Dressing/Undressing Upper body dressing   What is the patient wearing?: Pull over shirt    Upper body assist Assist Level: Maximal Assistance - Patient 25 - 49%    Lower Body Dressing/Undressing Lower body dressing      What is the patient wearing?: Pants      Lower body assist Assist for lower body dressing: Moderate Assistance - Patient 50 - 74%     Toileting Toileting    Toileting assist Assist for toileting: Maximal Assistance - Patient 25 - 49%     Transfers Chair/bed transfer  Transfers assist     Chair/bed transfer assist level: 2 Helpers     Locomotion Ambulation   Ambulation assist      Assist level: 2 helpers(max A 1 and +2 w/c follow) Assistive device: Other (comment)(R hallway rail) Max distance: 107ft   Walk 10 feet activity   Assist     Assist level: 2 helpers(max A 1 and +2 w/c follow) Assistive device: Other (comment)(R hallway rail)   Walk 50 feet activity   Assist Walk 50 feet with 2 turns activity did not occur: Safety/medical concerns         Walk 150 feet activity   Assist Walk 150 feet activity did not occur: Safety/medical concerns         Walk 10 feet on uneven surface  activity   Assist Walk 10 feet on uneven surfaces activity did not occur: Safety/medical concerns         Wheelchair     Assist Will patient use wheelchair at discharge?: (TBD)             Wheelchair 50 feet with 2 turns activity    Assist  Wheelchair 150 feet activity     Assist            Medical Problem List and Plan: 1.  Altered mental status left hemiparesis secondary to right MCA infarction along with a small left CR and splenium infarct embolic pattern likely due to transient cardiomyopathy with low EF after cardiac catheterization for non-STEMI with stenting complicated by groin hematoma  Continue CIR 2.  Antithrombotics: -DVT/anticoagulation: SCDs             -antiplatelet therapy: Plavix 3. Pain Management: Tylenol as needed 4. Mood: Amantadine 100 mg twice daily, melatonin 3 mg nightly             -antipsychotic agents: N/A 5. Neuropsych: This patient is not capable of making decisions on her own behalf. 6. Skin/Wound Care: Routine skin checks 7.  Fluids/Electrolytes/Nutrition: Routine in and outs.  8.  GI bleed/duodenal ulcer.  Status post EGD 10/08/2019 with epi and hemostasis clips x5.  PPI twice daily             See #9 9.  Acute blood loss anemia.    Hemoglobin 9.1 on 4/15  Continue to monitor 10.  AKI.  Resolved.  11.  Acute urinary tract infection.  Patient completed 5-day course amoxicillin 10/17/2019 12.  Post stroke dysphagia.  Dysphagia #1 nectars liquids.  Follow-up speech therapy             Advance diet as tolerated 13.  Keratoconjunctivitis.  Follow-up ophthalmology services.    Erythromycin 4 times a day x7 days initiated 10/12/19.  4/17- today is last day 14.  Hypertension.  Coreg 3.125 mg twice daily.    Labile on 4/16, monitor trend  4/17- BP 130s/80s up to 160s/98- will monitor and if stays up, will change meds             Monitor with increased mobility 15.  Hyperlipidemia.  Lipitor             Completed 16.  Prediabetes.  Hemoglobin A1c 5.9.  SSI  Slightly labile on 4/16, monitor for trend   CBG (last 3)  Recent Labs    10/18/19 2118 10/19/19 0616 10/19/19 1205  GLUCAP 130* 118* 180*    4/17- BGs 110s-180s- con't SSI only             Monitor with increased mobility 17. Acute systolic CHF   Filed Weights   10/16/19 1900 10/17/19 0500 10/18/19 0301  Weight: 70.4 kg 69.8 kg 70.2 kg   Stable on 4/16  4/17- weight stable- con't regimen 18. Hypomagnesemia  Mag 1.8 on 4/14  Will order labs next week  LOS: 3 days A FACE TO FACE EVALUATION WAS PERFORMED  Nelline Lio 10/19/2019, 2:23 PM

## 2019-10-19 NOTE — Progress Notes (Signed)
Speech Language Pathology Daily Session Note  Patient Details  Name: Taleia Sadowski MRN: [id number] Date of Birth: 1950/11/22  Today's Date: 10/19/2019 SLP Individual Time: 1325-1410 SLP Individual Time Calculation (min): 45 min  Short Term Goals: Week 1: SLP Short Term Goal 1 (Week 1): Pt will sustain attention to basic familiar task for ~ 3 minutes with Mod A cues. SLP Short Term Goal 2 (Week 1): With Mod A cues, pt will utilize speech intelligibilty strategies to increase intelligibility to ~ 75% at simple phrase level. SLP Short Term Goal 3 (Week 1): Pt will consume least restrictive diet with minimal overt s/s of aspiration or dysphagia with Min A cues for swallow strategies.  Skilled Therapeutic Interventions:  Skilled treatment session targeted pt's dysphagia and cognition goals as well as caregiver education on results of MBS. SLP facilitated session by providing moderate cues sequencing during transfer to bathroom. Pt was continent of urine. Additionally, MBS imaging was reviewed with pt's son and impairments explain. All questions answered to their satisfaction. Additionally, SLP provided skilled observation of pt consuming thin liquids with SLP holding cup and straw on pt's left. With tactile feedback, pt able to keep head turned to left when swallowing ` 75% of time. However pt intermittently released head turn prior to swallow with overt s/s of aspiration present (slightly delayed throat clear/cough). Pt left upright in her wheelchair, lap belt alarm on and all needs within reach, son present. Continue per current plan of care.      Pain    Therapy/Group: Individual Therapy  Tyria Springer 10/19/2019, 2:32 PM

## 2019-10-19 NOTE — Plan of Care (Signed)
  Problem: Consults Goal: RH STROKE PATIENT EDUCATION Description: See Patient Education module for education specifics  Outcome: Progressing Goal: Nutrition Consult-if indicated Outcome: Progressing   Problem: RH BOWEL ELIMINATION Goal: RH STG MANAGE BOWEL WITH ASSISTANCE Description: STG Manage Bowel with mod I Assistance. Outcome: Progressing Goal: RH STG MANAGE BOWEL W/MEDICATION W/ASSISTANCE Description: STG Manage Bowel with Medication with mod I Assistance. Outcome: Progressing   Problem: RH BLADDER ELIMINATION Goal: RH STG MANAGE BLADDER WITH ASSISTANCE Description: STG Manage Bladder With mod I Assistance Outcome: Progressing   Problem: RH SKIN INTEGRITY Goal: RH STG SKIN FREE OF INFECTION/BREAKDOWN Description: Patient will not have new areas of skin break down  Outcome: Progressing Goal: RH STG MAINTAIN SKIN INTEGRITY WITH ASSISTANCE Description: STG Maintain Skin Integrity With mod I Assistance. Outcome: Progressing   Problem: RH SAFETY Goal: RH STG ADHERE TO SAFETY PRECAUTIONS W/ASSISTANCE/DEVICE Description: STG Adhere to Safety Precautions With Assistance/Device. Outcome: Progressing   Problem: RH COGNITION-NURSING Goal: RH STG USES MEMORY AIDS/STRATEGIES W/ASSIST TO PROBLEM SOLVE Description: STG Uses Memory Aids/Strategies With Assistance to Problem Solve. Outcome: Progressing Goal: RH STG ANTICIPATES NEEDS/CALLS FOR ASSIST W/ASSIST/CUES Description: STG Anticipates Needs/Calls for Assist With Assistance/Cues. Outcome: Progressing   Problem: RH PAIN MANAGEMENT Goal: RH STG PAIN MANAGED AT OR BELOW PT'S PAIN GOAL Description: Pain scale <4/10 Outcome: Progressing   Problem: RH KNOWLEDGE DEFICIT Goal: RH STG INCREASE KNOWLEDGE OF HYPERTENSION Outcome: Progressing Goal: RH STG INCREASE KNOWLEDGE OF DYSPHAGIA/FLUID INTAKE Outcome: Progressing Goal: RH STG INCREASE KNOWLEDGE OF STROKE PROPHYLAXIS Outcome: Progressing   Problem: RH Vision Goal: RH LTG  Vision (Specify) Outcome: Progressing   

## 2019-10-19 NOTE — Progress Notes (Signed)
Physical Therapy Session Note  Patient Details  Name: Brittney Kennedy MRN: [id number] Date of Birth: [birth year]  Today's Date: 10/19/2019 PT Individual Time: 1051-1206 PT Individual Time Calculation (min): 75 min   Short Term Goals: Week 1:  PT Short Term Goal 1 (Week 1): Pt will perform supine<>sit with max assist of 1 PT Short Term Goal 2 (Week 1): Pt will perform sit<>stands with max A of 1 PT Short Term Goal 3 (Week 1): Pt will perform bed<>chair transfers with max assist of 1  Skilled Therapeutic Interventions/Progress Updates: Pt presented in bed with son, Marden Noble present agreeable to therapy. Pt denies pain at rest but states L hip was sore this am. PTA donned TED hose total A and performed supine to sit with HOB elevated maxA with max cues for sequencing. Once sitting upright pt indicated need for bathroom. Performed St[place] transfer to toilet with modA x 2 for STS from EOB. Pt able to stand from [facility] and required modA for sitting on toilet for controlled descent (+void). [place] STS from toilet and PTA performed LB clothing management total A for time and energy conservation. Performed hand hygiene at sink seated in [place] with minA for LUE management. Pt then transferred to w/c. Marden Noble indicated that pt now has shoes and PTA donned shoes total A. Pt then transferred to rehab gym and performed squat pivot transfer to max modA x 2. Performed STS with RW x 5 for NMR via forced use of LLE. Pt initially requiring modA x 2 fading to modA x 1 with posterior lean initially upon standing however pt able to correct with verbal cues. Pt also performed pre-gait stepping in place with pt requiring max cues for L quad activation when wt shifting to L to lift R foot. PTA initially blocking L knee in extension however pt demonstrating some carryover and was able to progress to maintain knee extension with verbal cues. Pt participated in x 2 bouts of reaching and placing horseshoes to basketball hoop with emphasis on  wt shifting to L. Pt able to maintain L knee extension wit mod multimodal cues to activation quad. Pt required increased time between activities due to fatigue during session. Performed squat pivot transfer to R with modA x 1 to w/c! Pt transported back to room at end of session and requesting to brush teeth. Pt brushed teeth with minA to ensure cleanliness. Pt agreeable to remain in w/c at end of session to eat lunch. Pt left in w/c with half lap tray in place, belt alarm on, call bell within reach and current needs met.       Therapy Documentation Precautions:  Precautions Precautions: Fall, Other (comment) Precaution Comments: aspiration precautions (HOB >30 degrees), significant L inattention/neglect, signifcant L hemiparesis (UE>LE with L shoulder subluxation) Restrictions Weight Bearing Restrictions: No General:   Vital Signs:  Pain:   Mobility:   Locomotion :    Trunk/Postural Assessment :    Balance:   Exercises:   Other Treatments:      Therapy/Group: Individual Therapy  Carleena Mires 10/19/2019, 12:56 PM

## 2019-10-20 ENCOUNTER — Inpatient Hospital Stay (HOSPITAL_COMMUNITY): Payer: Medicare Other | Admitting: Occupational Therapy

## 2019-10-20 DIAGNOSIS — I749 Embolism and thrombosis of unspecified artery: Secondary | ICD-10-CM | POA: Diagnosis not present

## 2019-10-20 LAB — GLUCOSE, CAPILLARY
Glucose-Capillary: 106 mg/dL — ABNORMAL HIGH (ref 70–99)
Glucose-Capillary: 115 mg/dL — ABNORMAL HIGH (ref 70–99)
Glucose-Capillary: 111 mg/dL — ABNORMAL HIGH (ref 70–99)
Glucose-Capillary: 125 mg/dL — ABNORMAL HIGH (ref 70–99)

## 2019-10-20 MED ORDER — DOCUSATE SODIUM 100 MG PO CAPS
100.0000 mg | ORAL_CAPSULE | Freq: Two times a day (BID) | ORAL | Status: DC
  Administered 2019-10-20 – 2019-11-14 (×50): 100 mg via ORAL
  Filled 2019-10-20 (×52): qty 1

## 2019-10-20 MED ORDER — CARVEDILOL 6.25 MG PO TABS
6.2500 mg | ORAL_TABLET | Freq: Two times a day (BID) | ORAL | Status: DC
  Administered 2019-10-20 – 2019-11-14 (×49): 6.25 mg via ORAL
  Filled 2019-10-20 (×50): qty 1

## 2019-10-20 NOTE — Progress Notes (Signed)
[facility] PHYSICAL MEDICINE & REHABILITATION PROGRESS NOTE  Subjective/Complaints:   Pt reports very sleepy this AM-  Tired- asking where son is- explained he wasn't here yet.     ROS:   Pt denies SOB, abd pain, CP, N/V/C/D, and vision changes    Objective: Vital Signs: Blood pressure (!) 173/78, pulse 76, temperature 98.3 F (36.8 C), temperature source Oral, resp. rate 19, height 5\' 2"  (1.575 m), weight 70.2 kg, SpO2 98 %. No results found. No results for input(s): WBC, HGB, HCT, PLT in the last 72 hours. No results for input(s): NA, K, CL, CO2, GLUCOSE, BUN, CREATININE, CALCIUM in the last 72 hours.  Physical Exam: BP (!) 173/78 (BP Location: Right Arm)   Pulse 76   Temp 98.3 F (36.8 C) (Oral)   Resp 19   Ht 5\' 2"  (1.575 m)   Wt 70.2 kg   SpO2 98%   BMI 28.31 kg/m  Constitutional: No distress . Vital signs reviewed. Sleepy- woke to mumble about son and brother, then back to sleep, NAD HENT: Normocephalic.  Atraumatic. Eyes: R eye conjunctiva still slightly pink - no discharge Cardiovascular: RRR Respiratory: CTA B/L- no W/R/R- good air movement GI: Soft, NT, ND, (+)BS  Skin: Warm and dry.  Intact.no skin breakdown seen Psych: sleepy Musc: No edema in extremities.  No tenderness in extremities. Neurological: overall alert Dysarthria Facial weakness Examination limited due to participation  Motor: LUE: ? 0/5 proximal to distal, unchanged RLE: 4+/5 proximal to distal LLE: HF, KE 2/5, ADF 4/5  Assessment/Plan: 1. Functional deficits secondary to bilateral embolic infarcts.  Which require 3+ hours per day of interdisciplinary therapy in a comprehensive inpatient rehab setting.  Physiatrist is providing close team supervision and 24 hour management of active medical problems listed below.  Physiatrist and rehab team continue to assess barriers to discharge/monitor patient progress toward functional and medical goals  Care Tool:  Bathing    Body parts  bathed by patient: Chest, Abdomen, Front perineal area, Right upper leg, Left upper leg, Face   Body parts bathed by helper: Buttocks, Right lower leg, Left lower leg, Right arm, Left arm     Bathing assist Assist Level: Moderate Assistance - Patient 50 - 74%     Upper Body Dressing/Undressing Upper body dressing   What is the patient wearing?: Pull over shirt    Upper body assist Assist Level: Maximal Assistance - Patient 25 - 49%    Lower Body Dressing/Undressing Lower body dressing      What is the patient wearing?: Pants     Lower body assist Assist for lower body dressing: Moderate Assistance - Patient 50 - 74%     Toileting Toileting    Toileting assist Assist for toileting: Maximal Assistance - Patient 25 - 49%     Transfers Chair/bed transfer  Transfers assist     Chair/bed transfer assist level: 2 Helpers     Locomotion Ambulation   Ambulation assist      Assist level: 2 helpers(max A 1 and +2 w/c follow) Assistive device: Other (comment)(R hallway rail) Max distance: 43ft   Walk 10 feet activity   Assist     Assist level: 2 helpers(max A 1 and +2 w/c follow) Assistive device: Other (comment)(R hallway rail)   Walk 50 feet activity   Assist Walk 50 feet with 2 turns activity did not occur: Safety/medical concerns         Walk 150 feet activity   Assist Walk 150 feet  activity did not occur: Safety/medical concerns         Walk 10 feet on uneven surface  activity   Assist Walk 10 feet on uneven surfaces activity did not occur: Safety/medical concerns         Wheelchair     Assist Will patient use wheelchair at discharge?: (TBD)             Wheelchair 50 feet with 2 turns activity    Assist            Wheelchair 150 feet activity     Assist            Medical Problem List and Plan: 1.  Altered mental status left hemiparesis secondary to right MCA infarction along with a small left CR  and splenium infarct embolic pattern likely due to transient cardiomyopathy with low EF after cardiac catheterization for non-STEMI with stenting complicated by groin hematoma  Continue CIR 2.  Antithrombotics: -DVT/anticoagulation: SCDs             -antiplatelet therapy: Plavix 3. Pain Management: Tylenol as needed 4. Mood: Amantadine 100 mg twice daily, melatonin 3 mg nightly             -antipsychotic agents: N/A 5. Neuropsych: This patient is not capable of making decisions on her own behalf. 6. Skin/Wound Care: Routine skin checks 7. Fluids/Electrolytes/Nutrition: Routine in and outs.  8.  GI bleed/duodenal ulcer.  Status post EGD 10/08/2019 with epi and hemostasis clips x5.  PPI twice daily             See #9 9.  Acute blood loss anemia.    Hemoglobin 9.1 on 4/15  Continue to monitor 10.  AKI.  Resolved.  11.  Acute urinary tract infection.  Patient completed 5-day course amoxicillin 10/17/2019 12.  Post stroke dysphagia.  Dysphagia #1 nectars liquids.  Follow-up speech therapy             Advance diet as tolerated 13.  Keratoconjunctivitis.  Follow-up ophthalmology services.    Erythromycin 4 times a day x7 days initiated 10/12/19.  4/17- today is last day 14.  Hypertension.  Coreg 3.125 mg twice daily.    Labile on 4/16, monitor trend  4/17- BP 130s/80s up to 160s/98- will monitor and if stays up, will change meds  4/18- BP 173/78- will increase Coreg to 6.25 mg BID with meals since still elevated             Monitor with increased mobility 15.  Hyperlipidemia.  Lipitor             Completed 16.  Prediabetes.  Hemoglobin A1c 5.9.  SSI  Slightly labile on 4/16, monitor for trend   CBG (last 3)  Recent Labs    10/19/19 2114 10/20/19 0610 10/20/19 1151  GLUCAP 148* 106* 115*    4/18- BGs 106-148- con't SSI             Monitor with increased mobility 17. Acute systolic CHF   Filed Weights   10/16/19 1900 10/17/19 0500 10/18/19 0301  Weight: 70.4 kg 69.8 kg 70.2 kg    Stable on 4/16  4/18- no weights x 2 days- will ask nursing 18. Hypomagnesemia  Mag 1.8 on 4/14  Will order labs next week  LOS: 4 days A FACE TO FACE EVALUATION WAS PERFORMED  Arryanna Holquin 10/20/2019, 3:45 PM

## 2019-10-20 NOTE — Progress Notes (Addendum)
Occupational Therapy Session Note  Patient Details  Name: Brittney Kennedy MRN: [id number] Date of Birth: [birth year]  Today's Date: 10/20/2019 OT Individual Time: 9417-4081 OT Individual Time Calculation (min): 30 min  30 minutes missed  Short Term Goals: Week 1:  OT Short Term Goal 1 (Week 1): Pt will be able to use L hand as a stabilizing A with UB bathing. OT Short Term Goal 2 (Week 1): Pt will be able to find items on her food tray on L side with min cues. OT Short Term Goal 3 (Week 1): Pt will complete squat pivot to toilet with mod A. OT Short Term Goal 4 (Week 1): Pt will don a shirt with mod A. OT Short Term Goal 5 (Week 1): Pt will rise to stand with mod A.  Skilled Therapeutic Interventions/Progress Updates:    Pt greeted in the w/c, reporting fatigue and not sleeping well last night. Agreeable to remain up in the w/c to eat her lunch. OT placed her plate Lt of midline, mod-max cuing for scanning to find her food. Pt also very tangential, needing max multimodal cues for sustained attention to task. While she was eating, her son [name] arrived who is a Copy. He is cleared to assist her with eating. We discussed implementing therapeutic strategies, such as HOH to include Lt hand while eating her magic cup, and placing meal items Lt of midline to promote visual scanning towards her affected side. Doug agreeable. Pt wanted her son to assist her with eating the rest of her meal vs OT. 30 minutes missed. Pt left with son who brought her more appetizing pureed food to eat.   Therapy Documentation Precautions:  Precautions Precautions: Fall, Other (comment) Precaution Comments: aspiration precautions (HOB >30 degrees), significant L inattention/neglect, signifcant L hemiparesis (UE>LE with L shoulder subluxation) Restrictions Weight Bearing Restrictions: No Pain: No s/s pain during session    ADL: ADL Grooming: Minimal assistance Where Assessed-Grooming: Sitting at sink Upper Body  Bathing: Moderate assistance Where Assessed-Upper Body Bathing: Shower Lower Body Bathing: Maximal assistance Where Assessed-Lower Body Bathing: Shower Upper Body Dressing: Maximal assistance Where Assessed-Upper Body Dressing: Wheelchair Lower Body Dressing: Dependent Where Assessed-Lower Body Dressing: Wheelchair, Bed level Toileting: Not assessed Toilet Transfer: Not assessed Film/video editor: Maximal assistance Film/video editor Method: Education officer, environmental: Shower seat with back, Grab bars     Therapy/Group: Individual Therapy  Deborah Lazcano A Tashe Purdon 10/20/2019, 4:45 PM

## 2019-10-20 NOTE — Plan of Care (Signed)
  Problem: Consults Goal: RH STROKE PATIENT EDUCATION Description: See Patient Education module for education specifics  Outcome: Progressing Goal: Nutrition Consult-if indicated Outcome: Progressing   Problem: RH BOWEL ELIMINATION Goal: RH STG MANAGE BOWEL WITH ASSISTANCE Description: STG Manage Bowel with mod I Assistance. Outcome: Progressing Goal: RH STG MANAGE BOWEL W/MEDICATION W/ASSISTANCE Description: STG Manage Bowel with Medication with mod I Assistance. Outcome: Progressing   Problem: RH BLADDER ELIMINATION Goal: RH STG MANAGE BLADDER WITH ASSISTANCE Description: STG Manage Bladder With mod I Assistance Outcome: Progressing   Problem: RH SKIN INTEGRITY Goal: RH STG SKIN FREE OF INFECTION/BREAKDOWN Description: Patient will not have new areas of skin break down  Outcome: Progressing Goal: RH STG MAINTAIN SKIN INTEGRITY WITH ASSISTANCE Description: STG Maintain Skin Integrity With mod I Assistance. Outcome: Progressing   Problem: RH SAFETY Goal: RH STG ADHERE TO SAFETY PRECAUTIONS W/ASSISTANCE/DEVICE Description: STG Adhere to Safety Precautions With Assistance/Device. Outcome: Progressing   Problem: RH COGNITION-NURSING Goal: RH STG USES MEMORY AIDS/STRATEGIES W/ASSIST TO PROBLEM SOLVE Description: STG Uses Memory Aids/Strategies With Assistance to Problem Solve. Outcome: Progressing Goal: RH STG ANTICIPATES NEEDS/CALLS FOR ASSIST W/ASSIST/CUES Description: STG Anticipates Needs/Calls for Assist With Assistance/Cues. Outcome: Progressing   Problem: RH PAIN MANAGEMENT Goal: RH STG PAIN MANAGED AT OR BELOW PT'S PAIN GOAL Description: Pain scale <4/10 Outcome: Progressing   Problem: RH KNOWLEDGE DEFICIT Goal: RH STG INCREASE KNOWLEDGE OF HYPERTENSION Outcome: Progressing Goal: RH STG INCREASE KNOWLEDGE OF DYSPHAGIA/FLUID INTAKE Outcome: Progressing Goal: RH STG INCREASE KNOWLEDGE OF STROKE PROPHYLAXIS Outcome: Progressing   Problem: RH Vision Goal: RH LTG  Vision (Specify) Outcome: Progressing

## 2019-10-21 ENCOUNTER — Inpatient Hospital Stay (HOSPITAL_COMMUNITY): Payer: Medicare Other | Admitting: *Deleted

## 2019-10-21 ENCOUNTER — Inpatient Hospital Stay (HOSPITAL_COMMUNITY): Payer: Medicare Other | Admitting: Speech Pathology

## 2019-10-21 ENCOUNTER — Encounter (HOSPITAL_COMMUNITY): Payer: Medicare Other | Admitting: Psychology

## 2019-10-21 ENCOUNTER — Inpatient Hospital Stay (HOSPITAL_COMMUNITY): Payer: Medicare Other

## 2019-10-21 ENCOUNTER — Inpatient Hospital Stay (HOSPITAL_COMMUNITY): Payer: Medicare Other | Admitting: Occupational Therapy

## 2019-10-21 DIAGNOSIS — I63511 Cerebral infarction due to unspecified occlusion or stenosis of right middle cerebral artery: Secondary | ICD-10-CM | POA: Diagnosis not present

## 2019-10-21 DIAGNOSIS — I749 Embolism and thrombosis of unspecified artery: Secondary | ICD-10-CM | POA: Diagnosis not present

## 2019-10-21 DIAGNOSIS — I5021 Acute systolic (congestive) heart failure: Secondary | ICD-10-CM | POA: Diagnosis not present

## 2019-10-21 DIAGNOSIS — I10 Essential (primary) hypertension: Secondary | ICD-10-CM | POA: Diagnosis not present

## 2019-10-21 DIAGNOSIS — E876 Hypokalemia: Secondary | ICD-10-CM

## 2019-10-21 LAB — CBC WITH DIFFERENTIAL/PLATELET
Abs Immature Granulocytes: 0.04 10*3/uL (ref 0.00–0.07)
Basophils Absolute: 0.1 10*3/uL (ref 0.0–0.1)
Basophils Relative: 2 %
Eosinophils Absolute: 0.1 10*3/uL (ref 0.0–0.5)
Eosinophils Relative: 2 %
HCT: 30.7 % — ABNORMAL LOW (ref 36.0–46.0)
Hemoglobin: 9.7 g/dL — ABNORMAL LOW (ref 12.0–15.0)
Immature Granulocytes: 1 %
Lymphocytes Relative: 35 %
Lymphs Abs: 2 10*3/uL (ref 0.7–4.0)
MCH: 30.2 pg (ref 26.0–34.0)
MCHC: 31.6 g/dL (ref 30.0–36.0)
MCV: 95.6 fL (ref 80.0–100.0)
Monocytes Absolute: 0.6 10*3/uL (ref 0.1–1.0)
Monocytes Relative: 11 %
Neutro Abs: 2.8 10*3/uL (ref 1.7–7.7)
Neutrophils Relative %: 49 %
Platelets: 280 10*3/uL (ref 150–400)
RBC: 3.21 MIL/uL — ABNORMAL LOW (ref 3.87–5.11)
RDW: 16 % — ABNORMAL HIGH (ref 11.5–15.5)
WBC: 5.7 10*3/uL (ref 4.0–10.5)
nRBC: 0 % (ref 0.0–0.2)

## 2019-10-21 LAB — BASIC METABOLIC PANEL
Anion gap: 13 (ref 5–15)
BUN: 11 mg/dL (ref 8–23)
CO2: 23 mmol/L (ref 22–32)
Calcium: 9.7 mg/dL (ref 8.9–10.3)
Chloride: 103 mmol/L (ref 98–111)
Creatinine, Ser: 0.9 mg/dL (ref 0.44–1.00)
GFR calc Af Amer: >60 mL/min (ref >60)
GFR calc non Af Amer: >60 mL/min (ref >60)
Glucose, Bld: 107 mg/dL — ABNORMAL HIGH (ref 70–99)
Potassium: 3.4 mmol/L — ABNORMAL LOW (ref 3.5–5.1)
Sodium: 139 mmol/L (ref 135–145)

## 2019-10-21 LAB — MAGNESIUM: Magnesium: 1.5 mg/dL — ABNORMAL LOW (ref 1.7–2.4)

## 2019-10-21 LAB — GLUCOSE, CAPILLARY
Glucose-Capillary: 97 mg/dL (ref 70–99)
Glucose-Capillary: 178 mg/dL — ABNORMAL HIGH (ref 70–99)
Glucose-Capillary: 112 mg/dL — ABNORMAL HIGH (ref 70–99)
Glucose-Capillary: 157 mg/dL — ABNORMAL HIGH (ref 70–99)

## 2019-10-21 MED ORDER — MAGNESIUM SULFATE 4 GM/100ML IV SOLN
4.0000 g | Freq: Once | INTRAVENOUS | Status: AC
  Administered 2019-10-21: 4 g via INTRAVENOUS
  Filled 2019-10-21: qty 100

## 2019-10-21 MED ORDER — POTASSIUM CHLORIDE CRYS ER 20 MEQ PO TBCR
30.0000 meq | EXTENDED_RELEASE_TABLET | ORAL | Status: AC
  Administered 2019-10-21 (×2): 30 meq via ORAL
  Filled 2019-10-21 (×2): qty 1

## 2019-10-21 MED ORDER — MAGNESIUM GLUCONATE 500 MG PO TABS
500.0000 mg | ORAL_TABLET | Freq: Every day | ORAL | Status: DC
  Administered 2019-10-21 – 2019-10-24 (×4): 500 mg via ORAL
  Filled 2019-10-21 (×5): qty 1

## 2019-10-21 NOTE — Progress Notes (Signed)
Physical Therapy Session Note  Patient Details  Name: Brittney Kennedy MRN: [id number] Date of Birth: [birth year]  Today's Date: 10/21/2019 PT Individual Time: 5427-0623 PT Individual Time Calculation (min): 41 min    Short Term Goals: Week 1:  PT Short Term Goal 1 (Week 1): Pt will perform supine<>sit with max assist of 1 PT Short Term Goal 2 (Week 1): Pt will perform sit<>stands with max A of 1 PT Short Term Goal 3 (Week 1): Pt will perform bed<>chair transfers with max assist of 1  Skilled Therapeutic Interventions/Progress Updates:    Pt supine in bed upon PT arrival, agreeable to therapy tx and denies pain at rest. Pt reports she has to use the bathroom. Pt transferred to sitting EOB with mod assist, sit<>stands within stedy mod assist and transferred to toilet with the stedy dependently. Assist for clothing management, pt continent of bladder, able to maintain standing balance within stedy with mod assist without UE support while performing her own pericare. Pt seated in stedy at the sink to wash hands with assist for washing L hand. Transferred to w/c with stedy dependently. Pt transported to the gym. Stand pivot to the mat with mod assist to the R. Pt performed sit<>stands from the mat this session without AD - mod-max assist. Pt worked on static standing balance this session without UE support (cues for L quad activation) and worked on dynamic standing balance without UE support to perform reaching activity with R UE, min-mod assist with cues for postural control and L quad activation/knee extension. Pt worked on L knee control, L LE weightbearing and dynamic balance to perform x 5 R LE sidesteps in place, mod-max assist for balance with verbal/tactile cues for quad activation. Pt performed x 5 sit<>stands without AD, emphasis on anterior weightshift with both sitting and standing, mod assist. Pt performed stand pivot to w/c with mod assist to the R and transported back to the room. Pt performed  stand pivot to bed with mod assist, sit>supine with mod assist. Pt left with needs in reach and bed alarm set.   Therapy Documentation Precautions:  Precautions Precautions: Fall, Other (comment) Precaution Comments: aspiration precautions (HOB >30 degrees), significant L inattention/neglect, signifcant L hemiparesis (UE>LE with L shoulder subluxation) Restrictions Weight Bearing Restrictions: No    Therapy/Group: Individual Therapy  Cresenciano Genre, PT, DPT, CSRS 10/21/2019, 7:53 AM

## 2019-10-21 NOTE — Consult Note (Signed)
Neuropsychological Consultation   Patient:   Brittney Kennedy   DOB:   06-14-1951  MR Number:  [id number]  Location:  [facility] [id number] [place] [address] Dept: [place]: [phone]           Date of Service:   10/21/2019  Start Time:   10 AM End Time:   11 AM  Provider/Observer:  Ilean Skill, Psy.D.       Clinical Neuropsychologist       Billing Code/Service: [phone] 4 Units  Chief Complaint:    Brittney Kennedy is a 69 year old female with unremarkable past medical history.  Patient presented on 09/27/2019 with intermittent nausea/vomiting for 1 week as well as epigastric substernal pain.  Cardiac catheterization revealed severe multivessel CAD with LV dysfunction, ejection fraction of 25%.  Patient was stented.  Acute systolic heart failure due to an ICM and follow-up management per cardiology services.  Neurology consulted on 09/30/2019 for lethargy, left facial droop, left upper extremity weakness.  Cranial CT showed right basal ganglia infarction as well as remote appearing right pontine infarction.  CT angiogram of neck moderate stenosis distal right M1 segment.  Follow-up CT of head 10/01/2019 showed acute infarct right parietal lobe age-indeterminate infarct right caudate.  Patient continued with dysphagia and reported today that she is having some issues with vision and processing visual information dysarthric speech.  Reason for Service:  The patient was referred for neuropsychological consultation due to significant medical/neurological complications.  Below is the HPI for the current admission.  HPI: Brittney Kennedy is a 69 year old right-handed female with unremarkable past medical history.  History taken from chart review due to distraction. Patient was independent prior to admission 1 level home.  She lives in the [place] area with a friend.  Family planning assistance as  needed on discharge.  She presented on 09/27/2019 with intermittent nausea/vomitting x1 week as well as epigastric substernal pain.  Admission chemistries potassium 3.0, troponin high-sensitivity 674, SARS coronavirus negative.  EKG question old septal infarct with lateral ST-T changes CT abdomen pelvis negative for acute intra-abdominal or pelvic pathology.  She was placed on IV heparin.  Cardiac catheterization revealed severe multivessel CAD with LV dysfunction, ejection fraction of 25%. Patient was stented. Acute systolic heart failure due to an ICM and follow-up management per cardiology services.  Patient did develop right groin hematoma after catheterization and currently maintained on Lovenox as well as low-dose aspirin.  Neurology consulted on 09/30/19 for lethargy, left facial droop, left upper extremity weakness.  Cranial CT showed right basal ganglia infarction as well as remote appearing right pontine infarction.  CT angiogram of the neck moderate stenosis distal right M1 segment.  Moderate to severe stenosis inferior branch of right middle cerebral artery.  No large vessel occlusion.  Follow-up CT of the head 10/01/2019 showed acute infarct right parietal lobe age-indeterminate infarct right caudate.  EEG negative for seizures.  Plavix was added to patient's prophylaxis for CVA.  Gastroenterology services consulted 10/08/2019 for melena BUN markedly elevated 49-50, hemoglobin dipping to 8.7 down to 7.3, WBC 17,700.  EGD upper endoscopy completed 10/08/2019 per Dr. Havery Moros showed actively bleeding duodenal ulcer with visible vessel treated with epi and hemostasis clips x5.  Latest hemoglobin stable 8.4 on 4/12 and renal function improved BUN 9 creatinine 0.81.  GI had recommended PPI twice daily and outpatient follow-up.  Patient's aspirin is been discontinued and currently maintained on Plavix.  Hospital  course further complicated by UTI, urine culture greater 100,000 Aerococcus viridans and patient  completed 5-day course of amoxicillin.  Ophthalmology consulted 10/12/2019 Dr. Dennison Mascot for keratoconjunctivitis who recommended erythromycin ophthalmic 4 times daily x7 days with warm compresses.  Patient is currently on a dysphagia #1 nectar thick liquid diet.  Therapy evaluations completed and patient was admitted for a comprehensive rehab program. Please see preadmission assessment from earlier today as well.   Current Status:  The patient was oriented today but describe difficulty reading various materials and being able to see her phone accurately.  This is potentially due to right parietal lobe dysfunction.  The patient presented with a rather odd affect but claims she is always "silly."  The patient reports that she was in generally good spirits and denied significant depression or anxiety.  The patient acknowledged having difficulty with motor function on her left side and significant visual disturbance.  Behavioral Observation: Brittney Kennedy  presents as a 69 y.o.-year-old Right Caucasian Female who appeared her stated age. her dress was Appropriate and she was Well Groomed and her manners were Appropriate to the situation.  her participation was indicative of Appropriate and Redirectable behaviors.  There were any physical disabilities noted.  she displayed an appropriate level of cooperation and motivation.     Interactions:    Active Appropriate and Redirectable  Attention:   abnormal and attention span appeared shorter than expected for age  Memory:   within normal limits; recent and remote memory intact  Visuo-spatial:  abnormal  Speech (Volume):  normal  Speech:   slurred; slurred  Thought Process:  Tangential and Disorganized  Though Content:  WNL; not suicidal and not homicidal  Orientation:   person, place and time/date  Judgment:   Fair  Planning:   Poor  Affect:    Appropriate  Mood:    Euthymic  Insight:   Present  Intelligence:   normal   Medical  History:   Past Medical History:  Diagnosis Date  . CAD (coronary artery disease)    a. 09/2019 NSTEMI/Cath: LM nl, LAD 90p, 62m diffuse mid-dist dzs, LCX 929mV groove/OM2, RI 90p, RCA dominant, nl, RPDA 9051mF 25%, glob HK.  . HMarland KitchenrEF (heart failure with reduced ejection fraction) (HCCKillian  a. 09/2019 LV gram: EF 25%, global HK.  . PMarland KitchenD (peptic ulcer disease)   . Seasonal allergies     Psychiatric History:  No prior psychiatric history  Family Med/Psych History:  Family History  Problem Relation Age of Onset  . Stroke Mother        died @ 89 74 Multiple myeloma Father        died @ 93 68 Multiple myeloma Sister     Impression/DX:  Brittney Kennedy a [age]ar old female with unremarkable past medical history.  Patient presented on 09/27/2019 with intermittent nausea/vomiting for 1 week as well as epigastric substernal pain.  Cardiac catheterization revealed severe multivessel CAD with LV dysfunction, ejection fraction of 25%.  Patient was stented.  Acute systolic heart failure due to an ICM and follow-up management per cardiology services.  Neurology consulted on 09/30/2019 for lethargy, left facial droop, left upper extremity weakness.  Cranial CT showed right basal ganglia infarction as well as remote appearing right pontine infarction.  CT angiogram of neck moderate stenosis distal right M1 segment.  Follow-up CT of head 10/01/2019 showed acute infarct right parietal lobe age-indeterminate infarct right caudate.  Patient continued with dysphagia and reported today  that she is having some issues with vision and processing visual information dysarthric speech.  The patient was oriented today but describe difficulty reading various materials and being able to see her phone accurately.  This is potentially due to right parietal lobe dysfunction.  The patient presented with a rather odd affect but claims she is always "silly."  The patient reports that she was in generally good spirits and denied  significant depression or anxiety.  The patient acknowledged having difficulty with motor function on her left side and significant visual disturbance.  The patient with primary motor changes and motor deficits although right parietal function is likely disturbed explaining some of her visual processing deficits and difficulties with comprehension of gestalt and global understanding of information.  Disposition/Plan:  Today we worked on understanding the residual effects of her recent stroke and worked on coping and planning.  Diagnosis:    Right middle cerebral artery stroke [facility]) - Plan: Ambulatory referral to Neurology         Electronically Signed   _______________________ Ilean Skill, Psy.D.

## 2019-10-21 NOTE — Progress Notes (Signed)
Speech Language Pathology Daily Session Note  Patient Details  Name: Brittney Kennedy MRN: [id number] Date of Birth: [birth year]  Today's Date: 10/21/2019 SLP Individual Time: 1120-1205 SLP Individual Time Calculation (min): 45 min  Short Term Goals: Week 1: SLP Short Term Goal 1 (Week 1): Pt will sustain attention to basic familiar task for ~ 3 minutes with Mod A cues. SLP Short Term Goal 2 (Week 1): With Mod A cues, pt will utilize speech intelligibilty strategies to increase intelligibility to ~ 75% at simple phrase level. SLP Short Term Goal 3 (Week 1): Pt will consume least restrictive diet with minimal overt s/s of aspiration or dysphagia with Min A cues for swallow strategies.  Skilled Therapeutic Interventions: Pt was seen for skilled ST intervention targeting aforementioned goals. Pt was pleasant and cooperative with unfamiliar therapist. SLP facilitated session by assisting pt with set up for oral care. Following oral care, SLP provided ice water. Pt indicated she preferred just ice chips via spoon, due to her tendency to be impulsive. SLP and pt reviewed water protocol to increase awareness and understanding of it. Max multimodal cues were required to attend to left margin when reading key points of the protocol. Pt was encouraged to slow her rate of speech, and utilize exaggerated lip and tongue movements to maximize intelligibility.     Pt was left in wheelchair with alarm on, all needs within reach. Continue ST per current plan of care.   Pain Pain Assessment Pain Scale: 0-10 Pain Score: 0-No pain  Therapy/Group: Individual Therapy   Jory Tanguma B. Murvin Natal, [facility], CCC-SLP Speech Language Pathologist  [facility] 10/21/2019, 12:40 PM

## 2019-10-21 NOTE — Progress Notes (Signed)
Occupational Therapy Session Note  Patient Details  Name: Brittney Kennedy MRN: [id number] Date of Birth: 08-20-1950  Today's Date: 10/21/2019 OT Individual Time: 0800-0900 OT Individual Time Calculation (min): 60 min    Short Term Goals: Week 1:  OT Short Term Goal 1 (Week 1): Pt will be able to use L hand as a stabilizing A with UB bathing. OT Short Term Goal 2 (Week 1): Pt will be able to find items on her food tray on L side with min cues. OT Short Term Goal 3 (Week 1): Pt will complete squat pivot to toilet with mod A. OT Short Term Goal 4 (Week 1): Pt will don a shirt with mod A. OT Short Term Goal 5 (Week 1): Pt will rise to stand with mod A.  Skilled Therapeutic Interventions/Progress Updates:  Pt received supine in bed agreeable to OT intervention. Pt alert and oriented x4.  Breakfast tray arrived at start of session therefore assisted pt with self feeding from EOB. MOD A for bed mobility to sequence steps and elevate trunk into sitting. Pt required verbal cues to locate food items on L side of tray. Pt able to don socks with CGA for sitting balance and required MAX A to don pants from EOB; +2 assist for sit<>stand from EOB to pull pants up to waist line. Pt complete stand pivot transfer to w/c to pts L side with MAX A. Pt completed seat grooming tasks at sink with set- up assist; cues to brush L side of hair and MAX A overall to de tangle hair. Education provided on compensatory method of applying paste to brush. Pt left up in w/c with alarm belt belt activated, RN present giving meds and all needs within reach.   Therapy Documentation Precautions:  Precautions Precautions: Fall, Other (comment) Precaution Comments: aspiration precautions (HOB >30 degrees), significant L inattention/neglect, signifcant L hemiparesis (UE>LE with L shoulder subluxation) Restrictions Weight Bearing Restrictions: No General:   Vital Signs:  Pain: Pt reports no pain during session.   Therapy/Group:  Individual Therapy  Angelina Pih 10/21/2019, 9:53 AM

## 2019-10-21 NOTE — Progress Notes (Addendum)
Advanced Heart Failure Rounding Note  PCP-Cardiologist: Ida Rogue, MD   Subjective:   cath 09/27/19 3v CAD. EF 20% - right groin hematoma. U/s 3/27 negative for PSA - 3/28 altered mental status due to pain meds. Head CT, ABG and ammonia normal - 09/30/19 CVA, ACS with DES LAD and mid CX - 3/30 Headache with stat CT. EEG- no seizure, cortical dysfunction in the right hemisphere likely secondary to underlying infarct as well as moderate to severe diffuse encephalopathy, nonspecific to etiology. - 3/30 Echo EF 50-55% - 4/6 developed GIB. Hgb 7.3. Transfused x 2 units. Urgent EGD 4/6 showed an actively bleeding duodenal bulb ulcer with visible vessel, treated with epi and hemostasis clips x 5. Hemostasis achieved  -4/7 Got another unit of RBCs. Hgb up to 9.1. Placed on protonix drip.    Feeling better. Says she can use left leg a little more. Denies chest pain.    Objective:   Weight Range: 70.2 kg Body mass index is 28.31 kg/m.   Vital Signs:   Temp:  [97.8 F (36.6 C)-98.2 F (36.8 C)] 98.2 F (36.8 C) (04/19 0500) Pulse Rate:  [65-75] 75 (04/19 0500) Resp:  [17-19] 17 (04/19 0500) BP: (135-152)/(59-73) 152/59 (04/19 0500) SpO2:  [98 %-99 %] 99 % (04/19 0500) Last BM Date: 10/19/19  Weight change: Filed Weights   10/16/19 1900 10/17/19 0500 10/18/19 0301  Weight: 70.4 kg 69.8 kg 70.2 kg    Intake/Output:   Intake/Output Summary (Last 24 hours) at 10/21/2019 0933 Last data filed at 10/21/2019 0900 Gross per 24 hour  Intake 830 ml  Output 100 ml  Net 730 ml      Physical Exam    General:  Elderly.  Sitting in the wheel chair. No resp difficulty HEENT: Normal. Anicteric Mild left facial droop  Neck: Supple. JVP flat . Carotids 2+ bilat; no bruits. No lymphadenopathy or thyromegaly appreciated. Cor: PMI nondisplaced. Regular rate & rhythm. No m,r,g  Lungs: Clear no wheeze Abdomen: Soft, nontender, nondistended. No hepatosplenomegaly. No bruits or masses.  Good bowel sounds. Extremities: No cyanosis, clubbing, rash, edema. LUE flaccid. LLE weak  Neuro: Alert & orientedx3, mild left facial droop still with some dysarthria . Affect pleasant    Labs    CBC Recent Labs    10/21/19 0758  WBC 5.7  NEUTROABS 2.8  HGB 9.7*  HCT 30.7*  MCV 95.6  PLT 220   Basic Metabolic Panel Recent Labs    10/21/19 0758  NA 139  K 3.4*  CL 103  CO2 23  GLUCOSE 107*  BUN 11  CREATININE 0.90  CALCIUM 9.7  MG 1.5*   Liver Function Tests No results for input(s): AST, ALT, ALKPHOS, BILITOT, PROT, ALBUMIN in the last 72 hours. No results for input(s): LIPASE, AMYLASE in the last 72 hours. Cardiac Enzymes No results for input(s): CKTOTAL, CKMB, CKMBINDEX, TROPONINI in the last 72 hours.  BNP: BNP (last 3 results) No results for input(s): BNP in the last 8760 hours.  ProBNP (last 3 results) No results for input(s): PROBNP in the last 8760 hours.   D-Dimer No results for input(s): DDIMER in the last 72 hours. Hemoglobin A1C No results for input(s): HGBA1C in the last 72 hours. Fasting Lipid Panel No results for input(s): CHOL, HDL, LDLCALC, TRIG, CHOLHDL, LDLDIRECT in the last 72 hours. Thyroid Function Tests No results for input(s): TSH, T4TOTAL, T3FREE, THYROIDAB in the last 72 hours.  Invalid input(s): FREET3  Other results:   Imaging  No results found.   Medications:     Scheduled Medications: . amantadine  100 mg Oral BID  . atorvastatin  80 mg Oral q1800  . carvedilol  6.25 mg Oral BID WC  . clopidogrel  75 mg Oral Q breakfast  . docusate sodium  100 mg Oral BID  . feeding supplement  1 Container Oral BID BM  . insulin aspart  0-15 Units Subcutaneous TID WC  . melatonin  3 mg Oral QHS  . multivitamin with minerals  1 tablet Oral Daily  . pantoprazole  40 mg Oral BID     Infusions:   PRN Medications:  acetaminophen **OR** acetaminophen, nitroGLYCERIN, polyethylene glycol, polyvinyl alcohol, Resource  ThickenUp Clear, senna-docusate     Assessment/Plan   1. CAD with NSTEMI - cath with severe diffuse 3v CAD particularly on left side with severe LV dysfunction. Ideally will need CABG, however in looking at her films worry about the quality of her LAD target and also not sure she would benefit much from a graft to the RCA. Concerned about her mobility especially after recent fall an groin hematoma and low albumin - s/p PCI of pLAD w/ DES x 1 + PCI of mLCX w/ DES x 1 - CMRI- LVEF 29% RV 37% Global hypokinesis.  - 2D echo 10/02/19 showed improved EF, up to 50-55% - No chest pain.  - continue Plavix and statin. Hold aspirin with GI bleed.   2. Acute systolic HF due to NICM - Initial EF ~25% - repeat echo 3/31, EF normalized, 50-55% - Holding off entresto. Per neurology allow SBP 130-150s  - Continue low dose carvedilol    3. R groin hematoma after fall - has hematoma at cath site.  - u/s negative for PSA - CT + hematoma. No mention of fracture but not completely imaged. - Resolved.   4. Hypokalemia/Hypomag K 3.6  - Mag 1.6  Give 4 grams mag.   5. Severe protein calorie malnutrition  - albumin 2.3   6 . Stroke, Acute  -CT 3/28 No acute findings  -CT 3/29 at 0500 R basal infact, small r pontine infact.  -CT 3/30 - Acute infarct centered in the right parietal lobe correlating with perfusion defects. Age-indeterminate infarct at the right caudate that is stable from yesterday. Continues to improve.   -PT/OT/Speech on CIR   7. NSVT On low dose carvedilol   8. Anemia, GI bleed.  H/O gastric ulcers years ago per son.  - Urgent EGD 4/6 showed an actively bleeding duodenal bulb ulcer with visible vessel, treated with epi and hemostasis clips x 5. Hemostasis achieved  - No further bleeding. Continue protonix 40 mg twice a day.  - ASA discontinued. Remains on Plavix for recently placed coronary DES    Length of Stay: 5  Amy Clegg, NP  10/21/2019, 9:33 AM  Advanced  Heart Failure Team Pager [phone] (M-F; 7a - 4p)  Please contact [facility] for night-coverage after hours (4p -7a ) and weekends on [email or web address]   Patient seen and examined with Tonye Becket, NP. We discussed all aspects of the encounter. I agree with the assessment and plan as stated above.   Improving from a functional perspective. LUE still flaccid. Now using WC.   No evidence of angina, HF or recurrent GI bleed. BPs are running high. 140-170 range. Will start losartan 25 mg daily. Continue carvedilol and Plavix. Supp K and mag.   Exam as above (edited)   Arvilla Meres, MD  9:52 AM

## 2019-10-21 NOTE — Plan of Care (Signed)
  Problem: Consults Goal: RH STROKE PATIENT EDUCATION Description: See Patient Education module for education specifics  Outcome: Progressing Goal: Nutrition Consult-if indicated Outcome: Progressing   Problem: RH BOWEL ELIMINATION Goal: RH STG MANAGE BOWEL WITH ASSISTANCE Description: STG Manage Bowel with mod I Assistance. Outcome: Progressing Goal: RH STG MANAGE BOWEL W/MEDICATION W/ASSISTANCE Description: STG Manage Bowel with Medication with mod I Assistance. Outcome: Progressing   Problem: RH BLADDER ELIMINATION Goal: RH STG MANAGE BLADDER WITH ASSISTANCE Description: STG Manage Bladder With mod I Assistance Outcome: Progressing   Problem: RH SKIN INTEGRITY Goal: RH STG SKIN FREE OF INFECTION/BREAKDOWN Description: Patient will not have new areas of skin break down  Outcome: Progressing Goal: RH STG MAINTAIN SKIN INTEGRITY WITH ASSISTANCE Description: STG Maintain Skin Integrity With mod I Assistance. Outcome: Progressing   Problem: RH SAFETY Goal: RH STG ADHERE TO SAFETY PRECAUTIONS W/ASSISTANCE/DEVICE Description: STG Adhere to Safety Precautions With Assistance/Device. Outcome: Progressing   Problem: RH COGNITION-NURSING Goal: RH STG USES MEMORY AIDS/STRATEGIES W/ASSIST TO PROBLEM SOLVE Description: STG Uses Memory Aids/Strategies With Assistance to Problem Solve. Outcome: Progressing Goal: RH STG ANTICIPATES NEEDS/CALLS FOR ASSIST W/ASSIST/CUES Description: STG Anticipates Needs/Calls for Assist With Assistance/Cues. Outcome: Progressing   Problem: RH PAIN MANAGEMENT Goal: RH STG PAIN MANAGED AT OR BELOW PT'S PAIN GOAL Description: Pain scale <4/10 Outcome: Progressing   Problem: Consults Goal: RH GENERAL PATIENT EDUCATION Description: See Patient Education module for education specifics. Outcome: Progressing   Problem: RH Vision Goal: RH LTG Vision (Specify) Outcome: Progressing

## 2019-10-21 NOTE — Progress Notes (Signed)
[facility] PHYSICAL MEDICINE & REHABILITATION PROGRESS NOTE  Subjective/Complaints: Patient seen laying in bed this morning.  She states she slept well overnight.  No reported issues overnight.  ROS:?  Reliability, however denies CP, shortness of breath, nausea, vomiting, diarrhea.  Objective: Vital Signs: Blood pressure (!) 152/59, pulse 75, temperature 98.2 F (36.8 C), resp. rate 17, height 5\' 2"  (1.575 m), weight 70.2 kg, SpO2 99 %. No results found. Recent Labs    10/21/19 0758  WBC 5.7  HGB 9.7*  HCT 30.7*  PLT 280   Recent Labs    10/21/19 0758  NA 139  K 3.4*  CL 103  CO2 23  GLUCOSE 107*  BUN 11  CREATININE 0.90  CALCIUM 9.7    Physical Exam: BP (!) 152/59 (BP Location: Left Arm)   Pulse 75   Temp 98.2 F (36.8 C)   Resp 17   Ht 5\' 2"  (1.575 m)   Wt 70.2 kg   SpO2 99%   BMI 28.31 kg/m  Constitutional: No distress . Vital signs reviewed. HENT: Normocephalic.  Atraumatic. Eyes: EOMI. No discharge. Cardiovascular: No JVD. Respiratory: Normal effort.  No stridor. GI: Non-distended. Skin: Warm and dry.  Intact. Psych: Normal mood.  Normal behavior. Musc: No edema in extremities.  No tenderness in extremities. Neurological: Alert and oriented x1 Dysarthria Facial weakness Examination limited due willingness to participate   Motor: LUE: ?2/5 proximal to distal RLE: 4+/5 proximal to distal LLE: HF, KE >/ 3/5, ADF 4/5  Assessment/Plan: 1. Functional deficits secondary to bilateral embolic infarcts.  Which require 3+ hours per day of interdisciplinary therapy in a comprehensive inpatient rehab setting.  Physiatrist is providing close team supervision and 24 hour management of active medical problems listed below.  Physiatrist and rehab team continue to assess barriers to discharge/monitor patient progress toward functional and medical goals  Care Tool:  Bathing    Body parts bathed by patient: Face   Body parts bathed by helper: Buttocks, Right  lower leg, Left lower leg, Right arm, Left arm     Bathing assist Assist Level: Set up assist     Upper Body Dressing/Undressing Upper body dressing   What is the patient wearing?: Pull over shirt    Upper body assist Assist Level: Maximal Assistance - Patient 25 - 49%    Lower Body Dressing/Undressing Lower body dressing      What is the patient wearing?: Pants     Lower body assist Assist for lower body dressing: Maximal Assistance - Patient 25 - 49%     Toileting Toileting    Toileting assist Assist for toileting: Maximal Assistance - Patient 25 - 49%     Transfers Chair/bed transfer  Transfers assist     Chair/bed transfer assist level: 2 Helpers     Locomotion Ambulation   Ambulation assist      Assist level: 2 helpers(max A 1 and +2 w/c follow) Assistive device: Other (comment)(R hallway rail) Max distance: 21ft   Walk 10 feet activity   Assist     Assist level: 2 helpers(max A 1 and +2 w/c follow) Assistive device: Other (comment)(R hallway rail)   Walk 50 feet activity   Assist Walk 50 feet with 2 turns activity did not occur: Safety/medical concerns         Walk 150 feet activity   Assist Walk 150 feet activity did not occur: Safety/medical concerns         Walk 10 feet on uneven surface  activity   Assist Walk 10 feet on uneven surfaces activity did not occur: Safety/medical concerns         Wheelchair     Assist Will patient use wheelchair at discharge?: (TBD)             Wheelchair 50 feet with 2 turns activity    Assist            Wheelchair 150 feet activity     Assist            Medical Problem List and Plan: 1.  Altered mental status left hemiparesis secondary to right MCA infarction along with a small left CR and splenium infarct embolic pattern likely due to transient cardiomyopathy with low EF after cardiac catheterization for non-STEMI with stenting complicated by groin  hematoma  Continue CIR 2.  Antithrombotics: -DVT/anticoagulation: SCDs             -antiplatelet therapy: Plavix 3. Pain Management: Tylenol as needed 4. Mood: Amantadine 100 mg twice daily, melatonin 3 mg nightly             -antipsychotic agents: N/A 5. Neuropsych: This patient is not capable of making decisions on her own behalf. 6. Skin/Wound Care: Routine skin checks 7. Fluids/Electrolytes/Nutrition: Routine in and outs.  8.  GI bleed/duodenal ulcer.  Status post EGD 10/08/2019 with epi and hemostasis clips x5.  PPI twice daily             See #9 9.  Acute blood loss anemia.    Hemoglobin 9.7 on 4/19  Continue to monitor 10.  AKI.  Resolved.  11.  Acute urinary tract infection.  Patient completed 5-day course amoxicillin 10/17/2019 12.  Post stroke dysphagia.  Dysphagia #1 nectars liquids.  Follow-up speech therapy             Advance diet as tolerated 13.  Keratoconjunctivitis.  Follow-up ophthalmology services.    Completed 7-day course of erythromycin drops on 4/17. 14.  Hypertension.     Increased Coreg to 6.25 mg BID with meals  Labile on 4/19             Monitor with increased mobility 15.  Hyperlipidemia.  Lipitor 16.  Prediabetes.  Hemoglobin A1c 5.9.  SSI  Labile on 4/19  CBG (last 3)  Recent Labs    10/20/19 2125 10/21/19 0501 10/21/19 1128  GLUCAP 125* 97 178*              Monitor with increased mobility 17. Acute systolic CHF   Filed Weights   10/16/19 1900 10/17/19 0500 10/18/19 0301  Weight: 70.4 kg 69.8 kg 70.2 kg   Daily weights ordered again  Stable on 4/16 18. Hypomagnesemia  Mag 1.5 on 4/19  Supplemented IV on 4/19  Supplement initiated 19.  Hypokalemia  Potassium 3.4 on 4/19  Supplemented x1 day  LOS: 5 days A FACE TO FACE EVALUATION WAS PERFORMED  Cari Burgo Lorie Phenix 10/21/2019, 1:16 PM

## 2019-10-21 NOTE — Progress Notes (Signed)
Occupational Therapy Session Note  Patient Details  Name: Brittney Kennedy MRN: [id number] Date of Birth: December 24, 1950  Today's Date: 10/21/2019 OT Individual Time: 1300-1400 OT Individual Time Calculation (min): 60 min (1400 - 1500 unattended estim)   Short Term Goals: Week 1:  OT Short Term Goal 1 (Week 1): Pt will be able to use L hand as a stabilizing A with UB bathing. OT Short Term Goal 2 (Week 1): Pt will be able to find items on her food tray on L side with min cues. OT Short Term Goal 3 (Week 1): Pt will complete squat pivot to toilet with mod A. OT Short Term Goal 4 (Week 1): Pt will don a shirt with mod A. OT Short Term Goal 5 (Week 1): Pt will rise to stand with mod A.  Skilled Therapeutic Interventions/Progress Updates:    Pt seen this session to facilitate LUE active movement. Pt received in wc completing lunch with NT.  Pt needed to toilet. She was able to complete stand pivot transfer to and from toilet with bar with mod A (2nd person present for safety). Max A for clothing management, pt able to cleanse front perineal area.    Pt taken to gym and completed mod A squat pivot to mat.   LUE NMR: Hand over hand guiding with B hands on dowel bar and reaching arms toward ball target.   Hand over hand guiding hand grasping, releasing and reaching for cones.   Pt needed frequent cues to attend to task as her attention span is very short and to attend to her L side. She did c/o shoulder pain stating it has been chronic most of her life due to an old rotator cuff injury.  She continues to have an anterior subluxation. Talked to patient about protecting her arm with positioning in bed.   Provided her a handout and placed above bed for nursing staff on bed positioning with pillows.  Pt taken back to room, mod A transfer back to bed. positioned pt in R sidelying.   Placed estim on L shoulder for unattended cyclic estim for 1 hour.   Pt tolerated well - removed 1 hour after. Saebo Stim  One 330 pulse width 35 Hz pulse rate On 8 sec/ off 8 sec Ramp up/ down 2 sec Symmetrical Biphasic wave form  Max intensity 191m at 500 Ohm load  Pt in bed with all needs met, bed alarm set.   Therapy Documentation Precautions:  Precautions Precautions: Fall, Other (comment) Precaution Comments: aspiration precautions (HOB >30 degrees), significant L inattention/neglect, signifcant L hemiparesis (UE>LE with L shoulder subluxation) Restrictions Weight Bearing Restrictions: No Therapy Vitals Temp: 97.8 F (36.6 C) Pulse Rate: 72 Resp: 19 BP: 128/68 Patient Position (if appropriate): Lying Oxygen Therapy SpO2: 100 % O2 Device: Room Air Pain: Pain Assessment Pain Scale: 0-10 Pain Score: 0-No pain    Therapy/Group: Individual Therapy  SLower Salem4/19/2021, 3:12 PM

## 2019-10-22 ENCOUNTER — Inpatient Hospital Stay (HOSPITAL_COMMUNITY): Payer: Medicare Other | Admitting: *Deleted

## 2019-10-22 ENCOUNTER — Inpatient Hospital Stay (HOSPITAL_COMMUNITY): Payer: Medicare Other | Admitting: Physical Therapy

## 2019-10-22 ENCOUNTER — Inpatient Hospital Stay (HOSPITAL_COMMUNITY): Payer: Medicare Other | Admitting: Speech Pathology

## 2019-10-22 DIAGNOSIS — I63511 Cerebral infarction due to unspecified occlusion or stenosis of right middle cerebral artery: Secondary | ICD-10-CM | POA: Diagnosis not present

## 2019-10-22 DIAGNOSIS — I5021 Acute systolic (congestive) heart failure: Secondary | ICD-10-CM | POA: Diagnosis not present

## 2019-10-22 DIAGNOSIS — I749 Embolism and thrombosis of unspecified artery: Secondary | ICD-10-CM | POA: Diagnosis not present

## 2019-10-22 DIAGNOSIS — I10 Essential (primary) hypertension: Secondary | ICD-10-CM | POA: Diagnosis not present

## 2019-10-22 LAB — GLUCOSE, CAPILLARY
Glucose-Capillary: 107 mg/dL — ABNORMAL HIGH (ref 70–99)
Glucose-Capillary: 127 mg/dL — ABNORMAL HIGH (ref 70–99)
Glucose-Capillary: 98 mg/dL (ref 70–99)
Glucose-Capillary: 146 mg/dL — ABNORMAL HIGH (ref 70–99)

## 2019-10-22 MED ORDER — POTASSIUM CHLORIDE CRYS ER 20 MEQ PO TBCR: 40.0000 meq | EXTENDED_RELEASE_TABLET | Freq: Once | ORAL | Status: DC

## 2019-10-22 NOTE — Progress Notes (Signed)
[facility] PHYSICAL MEDICINE & REHABILITATION PROGRESS NOTE  Subjective/Complaints: Patient seen laying in bed this AM. She states she slept well overnight. No reported issues overnight.  ROS:?  Reliability, denies CP, shortness of breath, nausea, vomiting, diarrhea.  Objective: Vital Signs: Blood pressure (!) 154/57, pulse 73, temperature 98.1 F (36.7 C), temperature source Oral, resp. rate 18, height 5\' 2"  (1.575 m), weight 70.2 kg, SpO2 100 %. No results found. Recent Labs    10/21/19 0758  WBC 5.7  HGB 9.7*  HCT 30.7*  PLT 280   Recent Labs    10/21/19 0758  NA 139  K 3.4*  CL 103  CO2 23  GLUCOSE 107*  BUN 11  CREATININE 0.90  CALCIUM 9.7    Physical Exam: BP (!) 154/57   Pulse 73   Temp 98.1 F (36.7 C) (Oral)   Resp 18   Ht 5\' 2"  (1.575 m)   Wt 70.2 kg   SpO2 100%   BMI 28.31 kg/m  Constitutional: No distress . Vital signs reviewed. HENT: Normocephalic.  Atraumatic. Eyes: EOMI. No discharge. Cardiovascular: No JVD. Respiratory: Normal effort.  No stridor. GI: Non-distended. Skin: Warm and dry.  Intact. Psych: Normal mood.  Normal behavior. Musc: No edema in extremities.  No tenderness in extremities. Neurological: Alert and oriented x1 Dysarthria, unchanged Facial weakness Examination limited due willingness to participate   Motor: LUE: ? 3+/5 proximal to distal RLE: 4+/5 proximal to distal LLE: HF, KE >/ 3/5, ADF 4/5  Assessment/Plan: 1. Functional deficits secondary to bilateral embolic infarcts.  Which require 3+ hours per day of interdisciplinary therapy in a comprehensive inpatient rehab setting.  Physiatrist is providing close team supervision and 24 hour management of active medical problems listed below.  Physiatrist and rehab team continue to assess barriers to discharge/monitor patient progress toward functional and medical goals  Care Tool:  Bathing    Body parts bathed by patient: Face   Body parts bathed by helper:  Buttocks, Right lower leg, Left lower leg, Right arm, Left arm     Bathing assist Assist Level: Set up assist     Upper Body Dressing/Undressing Upper body dressing   What is the patient wearing?: Pull over shirt    Upper body assist Assist Level: Maximal Assistance - Patient 25 - 49%    Lower Body Dressing/Undressing Lower body dressing      What is the patient wearing?: Pants     Lower body assist Assist for lower body dressing: Maximal Assistance - Patient 25 - 49%     Toileting Toileting    Toileting assist Assist for toileting: Maximal Assistance - Patient 25 - 49%     Transfers Chair/bed transfer  Transfers assist     Chair/bed transfer assist level: Moderate Assistance - Patient 50 - 74%     Locomotion Ambulation   Ambulation assist      Assist level: 2 helpers(max A 1 and +2 w/c follow) Assistive device: Other (comment)(R hallway rail) Max distance: 40ft   Walk 10 feet activity   Assist     Assist level: 2 helpers(max A 1 and +2 w/c follow) Assistive device: Other (comment)(R hallway rail)   Walk 50 feet activity   Assist Walk 50 feet with 2 turns activity did not occur: Safety/medical concerns         Walk 150 feet activity   Assist Walk 150 feet activity did not occur: Safety/medical concerns         Walk 10 feet  on uneven surface  activity   Assist Walk 10 feet on uneven surfaces activity did not occur: Safety/medical concerns         Wheelchair     Assist Will patient use wheelchair at discharge?: (TBD)             Wheelchair 50 feet with 2 turns activity    Assist            Wheelchair 150 feet activity     Assist            Medical Problem List and Plan: 1.  Altered mental status left hemiparesis secondary to right MCA infarction along with a small left CR and splenium infarct embolic pattern likely due to transient cardiomyopathy with low EF after cardiac catheterization for  non-STEMI with stenting complicated by groin hematoma  Continue CIR 2.  Antithrombotics: -DVT/anticoagulation: SCDs             -antiplatelet therapy: Plavix 3. Pain Management: Tylenol as needed 4. Mood: Amantadine 100 mg twice daily, melatonin 3 mg nightly             -antipsychotic agents: N/A 5. Neuropsych: This patient is not capable of making decisions on her own behalf. 6. Skin/Wound Care: Routine skin checks 7. Fluids/Electrolytes/Nutrition: Routine in and outs.  8.  GI bleed/duodenal ulcer.  Status post EGD 10/08/2019 with epi and hemostasis clips x5.  PPI twice daily             See #9 9.  Acute blood loss anemia.    Hemoglobin 9.7 on 4/19  Continue to monitor 10.  AKI.  Resolved.  11.  Acute urinary tract infection.  Patient completed 5-day course amoxicillin 10/17/2019 12.  Post stroke dysphagia.  Dysphagia #1 nectar liquids.  Follow-up speech therapy             Advance diet as tolerated 13.  Keratoconjunctivitis.  Follow-up ophthalmology services.    Completed 7-day course of erythromycin drops on 4/17. 14.  Hypertension.     Increased Coreg to 6.25 mg BID with meals  Labile at times on 4/20             Monitor with increased mobility 15.  Hyperlipidemia.  Lipitor 16.  Prediabetes.  Hemoglobin A1c 5.9.  SSI  Slightly labile on 4/20  CBG (last 3)  Recent Labs    10/21/19 1622 10/21/19 2115 10/22/19 0643  GLUCAP 112* 157* 107*              Monitor with increased mobility 17. Acute systolic CHF   Filed Weights   10/16/19 1900 10/17/19 0500 10/18/19 0301  Weight: 70.4 kg 69.8 kg 70.2 kg   Daily weights ordered again, remain pending  Stable on 4/16 18. Hypomagnesemia  Mag 1.5 on 4/19, plan to order labs at the end of the week  Supplemented IV on 4/19  Supplement initiated 19.  Hypokalemia  Potassium 3.4 on 4/19  Supplemented x1 day  LOS: 6 days A FACE TO FACE EVALUATION WAS PERFORMED   Karis Juba 10/22/2019, 11:15 AM

## 2019-10-22 NOTE — Plan of Care (Signed)
  Problem: Consults Goal: RH STROKE PATIENT EDUCATION Description: See Patient Education module for education specifics  Outcome: Progressing Goal: Nutrition Consult-if indicated Outcome: Progressing   Problem: RH BOWEL ELIMINATION Goal: RH STG MANAGE BOWEL WITH ASSISTANCE Description: STG Manage Bowel with mod I Assistance. Outcome: Progressing Goal: RH STG MANAGE BOWEL W/MEDICATION W/ASSISTANCE Description: STG Manage Bowel with Medication with mod I Assistance. Outcome: Progressing   Problem: RH BLADDER ELIMINATION Goal: RH STG MANAGE BLADDER WITH ASSISTANCE Description: STG Manage Bladder With mod I Assistance Outcome: Progressing   Problem: RH SKIN INTEGRITY Goal: RH STG SKIN FREE OF INFECTION/BREAKDOWN Description: Patient will not have new areas of skin break down  Outcome: Progressing Goal: RH STG MAINTAIN SKIN INTEGRITY WITH ASSISTANCE Description: STG Maintain Skin Integrity With mod I Assistance. Outcome: Progressing   Problem: RH SAFETY Goal: RH STG ADHERE TO SAFETY PRECAUTIONS W/ASSISTANCE/DEVICE Description: STG Adhere to Safety Precautions With Assistance/Device. Outcome: Progressing   Problem: RH COGNITION-NURSING Goal: RH STG USES MEMORY AIDS/STRATEGIES W/ASSIST TO PROBLEM SOLVE Description: STG Uses Memory Aids/Strategies With Assistance to Problem Solve. Outcome: Progressing Goal: RH STG ANTICIPATES NEEDS/CALLS FOR ASSIST W/ASSIST/CUES Description: STG Anticipates Needs/Calls for Assist With Assistance/Cues. Outcome: Progressing   Problem: RH PAIN MANAGEMENT Goal: RH STG PAIN MANAGED AT OR BELOW PT'S PAIN GOAL Description: Pain scale <4/10 Outcome: Progressing   Problem: Consults Goal: RH GENERAL PATIENT EDUCATION Description: See Patient Education module for education specifics. Outcome: Progressing   Problem: RH Vision Goal: RH LTG Vision (Specify) Outcome: Progressing   

## 2019-10-22 NOTE — Progress Notes (Signed)
Speech Language Pathology Daily Session Note  Patient Details  Name: Brittney Kennedy MRN: [id number] Date of Birth: Apr 29, 1951  Today's Date: 10/22/2019 SLP Individual Time: 3875-6433 SLP Individual Time Calculation (min): 55 min  Short Term Goals: Week 1: SLP Short Term Goal 1 (Week 1): Pt will sustain attention to basic familiar task for ~ 3 minutes with Mod A cues. SLP Short Term Goal 2 (Week 1): With Mod A cues, pt will utilize speech intelligibilty strategies to increase intelligibility to ~ 75% at simple phrase level. SLP Short Term Goal 3 (Week 1): Pt will consume least restrictive diet with minimal overt s/s of aspiration or dysphagia with Min A cues for swallow strategies.  Skilled Therapeutic Interventions: Pt was seen for skilled ST intervention targeting aforementioned goals. Pt was pleasant and cooperative, requesting toileting initially. Pt notified nursing via call bell, and requested assistance with fair intelligibility. Following toileting, pt completed hand washing and oral care after set up, with good attention to task. Pt was given ice chips, which she appears to tolerate well via teaspoon. Pt recalled intelligibility strategies with min cues, but has difficulty with carryover to conversation. Intelligibility at conversation level is~80%.   Pt was left in wheelchair with alarm on, all needs within reach. Continue ST per current plan of care.   Pain Pain Assessment Pain Scale: 0-10 Pain Score: 0-No pain  Therapy/Group: Individual Therapy  Brittney Kennedy B. Murvin Natal, [facility], CCC-SLP Speech Language Pathologist  Brittney Kennedy 10/22/2019, 11:05 AM

## 2019-10-22 NOTE — Progress Notes (Signed)
Occupational Therapy Session Note  Patient Details  Name: Brittney Kennedy MRN: [id number] Date of Birth: June 08, 1951  Today's Date: 10/22/2019 OT Individual Time: 1000-1100 OT Individual Time Calculation (min): 60 min    Short Term Goals: Week 1:  OT Short Term Goal 1 (Week 1): Pt will be able to use L hand as a stabilizing A with UB bathing. OT Short Term Goal 2 (Week 1): Pt will be able to find items on her food tray on L side with min cues. OT Short Term Goal 3 (Week 1): Pt will complete squat pivot to toilet with mod A. OT Short Term Goal 4 (Week 1): Pt will don a shirt with mod A. OT Short Term Goal 5 (Week 1): Pt will rise to stand with mod A.  Skilled Therapeutic Interventions/Progress Updates:  Pt recived seated in w/c upon OTA arrival agreeable to session. Session focus on BADL retraining and functional mobility. Pt complete stand pivot transfer to shower seat to pts L side with MODA. Pt complete bathing with MOD A needing hand over hand assist to bathe RUE with LUE. Pt declined bathing buttock, lower legs or front perineal area. Pt transfer out of shower to pts R side with MOD A. Pt complete UB dressing with MIN A needing cues to recall needing to dress LUE first; MAX A for LB dressing via figure four; MOD A sit<>stand at sink to pull pants up waist line. Pt able to don socks with set- up assist but requires total A to don TEDs. Pt left up in w/c wit all needs within reach and alarm belt activated.   Therapy Documentation Precautions:  Precautions Precautions: Fall, Other (comment) Precaution Comments: aspiration precautions (HOB >30 degrees), significant L inattention/neglect, signifcant L hemiparesis (UE>LE with L shoulder subluxation) Restrictions Weight Bearing Restrictions: No General:   Vital Signs: Therapy Vitals Pulse Rate: 73 BP: (!) 154/57 Pain: Pt reports no pain during session.   Therapy/Group: Individual Therapy  Angelina Pih 10/22/2019, 11:50 AM

## 2019-10-22 NOTE — Progress Notes (Signed)
Physical Therapy Session Note  Patient Details  Name: Brittney Kennedy MRN: [id number] Date of Birth: [birth year]  Today's Date: 10/22/2019 PT Individual Time: 3785-8850 PT Individual Time Calculation (min): 71 min   Short Term Goals: Week 1:  PT Short Term Goal 1 (Week 1): Pt will perform supine<>sit with max assist of 1 PT Short Term Goal 2 (Week 1): Pt will perform sit<>stands with max A of 1 PT Short Term Goal 3 (Week 1): Pt will perform bed<>chair transfers with max assist of 1  Skilled Therapeutic Interventions/Progress Updates:    Pt received sitting in w/c and agreeable to therapy session though reports she is "wiped out."  Pt requesting to use bathroom. Stand pivot w/c<>BSC over toilet using grab bar with mod assist for lifting, pivoting hips, and blocking L knee flexion - max cuing for sequencing. Standing with mod assist for balance and R UE support on grab bar required max assist for LB clothing management. Sit<>stand on/off BSC over toilet using grab bar with mod assist for lifting/lowering, balance, and blocking L knee. Continent of bladder and performed seated peri-care with min assist for managing toilet paper. Seated hand hygiene at sink.  Transported to/from gym in w/c for time management and energy conservation. Stand pivot transfers w/c<>EOM with mod assist for lifting/lowering, balance, and blocking L knee flexion throughout session - performed primarily L transfers to target L attention. Sit<>stands to/from EOM, no AD, with mod assist for lifting/lowering and balance due to L posterolateral trunk lean - mirror feedback provided but minimal use due to continued L inattention with R head turn preference - cuing for midline orientation in stance but pt unable to find position continuing to require mod/max assist to prevent posterior LOB. Progressed to pre-gait training, no UE support, via R LE stepping forward/backwards targeting L LE NMR stance control with mod assist for balance and  blocking L knee buckle with manual facilitation for increased L hip/knee extension. Standing balance and L attention task of grasping object on R with R hand then visually scanning to place it on surface to the L - mod/max assist for balance and blocking L knee buckle due to impaired dual-task abilities with pt not aware of L posterolateral LOB while completing reaching task. Educated pt on goal of performing tall kneeling but pt not agreeable due to reporting her knees are sensitive to pressure since falling at home when the stroke happened. Sit<>supine with mod assist for trunk control and L hemibody management - max cuing for increased pt independence. Supine bridging with L LE bias 2x10 reps with tactile cuing to keep knees from falling in/out and avoid pelvic rotation as well as increased L glute activation. Gait training via 3 Musketeers ~60ft x2 (seated break between) with +2 mod assist for balance and L LE management and +3 w/c follow - required mod/max assist for L LE management to prevent scissoring/hip adduction during swing and block L knee buckle during stance - demonstrates very limited hip/knee flexion and no ankle DF activation during swing as well as minimal hip/knee extensor and ankle PF activation during stance. Pt reporting significant fatigue and requesting assist back to bed. Transported back to room. Stand pivot to EOB with mod assist as described above. Sit>supine with mod assist for trunk descent and L hemibody management into the bed. Pt left supine in bed with HOB elevated 30degrees, needs in reach, and bed alarm on.   Therapy Documentation Precautions:  Precautions Precautions: Fall, Other (comment) Precaution Comments: aspiration precautions (HOB >  30 degrees), significant L inattention/neglect, signifcant L hemiparesis (UE>LE with L shoulder subluxation) Restrictions Weight Bearing Restrictions: No  Pain: Reports R hip discomfort during prolonged standing tasks - provided  seated rest breaks for pain management. Reports B knee sensitivity to weightbearing therefore unable to perform tall kneeling.    Therapy/Group: Individual Therapy  Tawana Scale, PT, DPT 10/22/2019, 12:32 PM

## 2019-10-23 ENCOUNTER — Inpatient Hospital Stay (HOSPITAL_COMMUNITY): Payer: Medicare Other | Admitting: *Deleted

## 2019-10-23 ENCOUNTER — Inpatient Hospital Stay (HOSPITAL_COMMUNITY): Payer: Medicare Other

## 2019-10-23 ENCOUNTER — Inpatient Hospital Stay (HOSPITAL_COMMUNITY): Payer: Medicare Other | Admitting: Occupational Therapy

## 2019-10-23 ENCOUNTER — Inpatient Hospital Stay (HOSPITAL_COMMUNITY): Payer: Medicare Other | Admitting: Speech Pathology

## 2019-10-23 LAB — GLUCOSE, CAPILLARY
Glucose-Capillary: 105 mg/dL — ABNORMAL HIGH (ref 70–99)
Glucose-Capillary: 154 mg/dL — ABNORMAL HIGH (ref 70–99)
Glucose-Capillary: 95 mg/dL (ref 70–99)
Glucose-Capillary: 153 mg/dL — ABNORMAL HIGH (ref 70–99)

## 2019-10-23 NOTE — Progress Notes (Signed)
Physical Therapy Session Note  Patient Details  Name: Brittney Kennedy MRN: [id number] Date of Birth: 1950-11-25  Today's Date: 10/23/2019 PT Individual Time: 1445-1540 PT Individual Time Calculation (min): 55 min   Short Term Goals: Week 1:  PT Short Term Goal 1 (Week 1): Pt will perform supine<>sit with max assist of 1 PT Short Term Goal 2 (Week 1): Pt will perform sit<>stands with max A of 1 PT Short Term Goal 3 (Week 1): Pt will perform bed<>chair transfers with max assist of 1  Skilled Therapeutic Interventions/Progress Updates:    Pt seated in w/c upon PT arrival, agreeable to therapy tx and denies pain, does report an "ache in the calves/thighs." Pt transported to the gym in w/c. Throughout session pt require cues for L attention with all mobility. Pt performed stand pivot to the mat with mod assist, cues for techniques to stand first and then step/turn. Pt worked on L LE strength, postural control and weightbearing to perform x 10 sit<>stands without UE support, mod assist. Pt worked on static standing balance and L quad activation this session with mirror for visual feedback and without UE support, cues and focus on correcting L lateral lean and correcting posterior LOB x 3 trials. Pt performed dynamic reaching task towards the R in standing to facilitate R lateral weightshift (correcting L lean) and facilitation of L knee extension/quad activation - x 2 trials - min assist for balance, without UE support. Pt declines working on ambulation today, "My legs are sore, I am tired. I am getting too old for this." Pt reports having to use the bathroom. Pt transported back to room in w/c. Stand pivot this session from w/c<>toilet with min/mod assist, min assist for standing balance while therapist assisted with clothing management. Pt requesting to have ice chips. Seated in w/c at the sink to wash hands and then perform oral care, therapist provided supervision for water protocol. Pt left in w/c at end  of session, needs in reach and chair alarm set.   Therapy Documentation Precautions:  Precautions Precautions: Fall, Other (comment) Precaution Comments: aspiration precautions (HOB >30 degrees), significant L inattention/neglect, signifcant L hemiparesis (UE>LE with L shoulder subluxation) Restrictions Weight Bearing Restrictions: No    Therapy/Group: Individual Therapy  Cresenciano Genre, PT, DPT, CSRS 10/23/2019, 8:01 AM

## 2019-10-23 NOTE — Progress Notes (Signed)
Nutrition Follow-up  DOCUMENTATION CODES:   Not applicable  INTERVENTION:  Continue Vital Cuisine shake PO TID, each supplement provides 500 kcal and 22 grams of protein  MVI daily   If being properly thickened and if pt consuming well, may continue Boost Breeze po BID, each supplement provides 250 kcal and 9 grams of protein   NUTRITION DIAGNOSIS:   Increased nutrient needs related to other (see comment)(therapies) as evidenced by estimated needs.  Ongoing.  GOAL:   Patient will meet greater than or equal to 90% of their needs  Progressing.  MONITOR:   PO intake, Supplement acceptance, Weight trends, Labs, Diet advancement  REASON FOR ASSESSMENT:   Malnutrition Screening Tool    ASSESSMENT:   68 yo female admitted with NSTEMI, acute CHF and later had a change in mental status and found to have acute stroke. PMH includes PUD, seasonal allergies, cholecystectomy and left rotator cuff repair.  3/26 Cardiac Cath, 3v CAD, EF 20% (new onset acute CHF) 3/28 Rapid response due to lethargy, slurred speech, CT head no acute findings 3/29 CVA: R basal infarct, small r pontine infarct 3/30 CT acute infact centered in r. Parietal lobe 4/3 MBS, Dysphagia 1, Nectar thick liquids 4/6 Rapid Response due to hypotension and dark tarry liquid stools; s/p EGD 4/14 admit to CIR  Pt eating at time of RD visit. Pt states her appetite is getting better and reports enjoying the Vital Cuisine shakes.   PO Intake: 10-60% x last 8 recorded meals (33% average meal intake)  Labs: K+ 3.4 (L), Mg 1.5 (L) CBGs [phone] Medications reviewed and include: Colace, Boost Breeze BID, Novolog, Magonate, MVI   Diet Order:   Diet Order            DIET - DYS 1 Room service appropriate? Yes; Fluid consistency: Nectar Thick  Diet effective now              EDUCATION NEEDS:   Not appropriate for education at this time  Skin:  Skin Assessment: Reviewed RN Assessment  Last BM:   4/19  Height:   Ht Readings from Last 1 Encounters:  10/16/19 5\' 2"  (1.575 m)    Weight:   Wt Readings from Last 1 Encounters:  10/18/19 70.2 kg    BMI:  Body mass index is 28.31 kg/m.  Estimated Nutritional Needs:   Kcal:  1500-1700  Protein:  75-85 grams  Fluid:  >/= 1.5 L   10/20/19, MS, RD, LDN RD pager number and weekend/on-call pager number located in [place].

## 2019-10-23 NOTE — Progress Notes (Addendum)
[facility] PHYSICAL MEDICINE & REHABILITATION PROGRESS NOTE  Subjective/Complaints: Patient seen laying in bed this AM.  She states she did not sleep well overnight because she was hot/cold.  She is more oriented this AM.   ROS: ? Reliability, denies CP, shortness of breath, nausea, vomiting, diarrhea.  Objective: Vital Signs: Blood pressure (!) 159/50, pulse 78, temperature 97.9 F (36.6 C), resp. rate 16, height 5\' 2"  (1.575 m), weight 70.2 kg, SpO2 100 %. No results found. Recent Labs    10/21/19 0758  WBC 5.7  HGB 9.7*  HCT 30.7*  PLT 280   Recent Labs    10/21/19 0758  NA 139  K 3.4*  CL 103  CO2 23  GLUCOSE 107*  BUN 11  CREATININE 0.90  CALCIUM 9.7    Physical Exam: BP (!) 159/50   Pulse 78   Temp 97.9 F (36.6 C)   Resp 16   Ht 5\' 2"  (1.575 m)   Wt 70.2 kg   SpO2 100%   BMI 28.31 kg/m  Constitutional: No distress . Vital signs reviewed. HENT: Normocephalic.  Atraumatic. Eyes: EOMI. No discharge. Cardiovascular: No JVD. Respiratory: Normal effort.  No stridor. GI: Non-distended. Skin: Warm and dry.  Intact. Psych: Normal mood.  Normal behavior. Musc: No edema in extremities.  No tenderness in extremities. Left shoulder subluxation.  Neurological: Alert and oriented, except for date of month Dysarthria, stable Facial weakness Examination limited due willingness to participate   Motor: LUE: ?3+/5 proximal to distal RLE: 4+/5 proximal to distal LLE: HF, KE >/ 3/5, ADF 4/5  Assessment/Plan: 1. Functional deficits secondary to bilateral embolic infarcts.  Which require 3+ hours per day of interdisciplinary therapy in a comprehensive inpatient rehab setting.  Physiatrist is providing close team supervision and 24 hour management of active medical problems listed below.  Physiatrist and rehab team continue to assess barriers to discharge/monitor patient progress toward functional and medical goals  Care Tool:  Bathing    Body parts bathed by  patient: Left arm, Chest, Abdomen, Front perineal area, Right upper leg, Left upper leg, Face   Body parts bathed by helper: Right arm     Bathing assist Assist Level: Moderate Assistance - Patient 50 - 74%     Upper Body Dressing/Undressing Upper body dressing   What is the patient wearing?: Pull over shirt    Upper body assist Assist Level: Minimal Assistance - Patient > 75%    Lower Body Dressing/Undressing Lower body dressing      What is the patient wearing?: Pants, Underwear/pull up     Lower body assist Assist for lower body dressing: Maximal Assistance - Patient 25 - 49%     Toileting Toileting    Toileting assist Assist for toileting: Maximal Assistance - Patient 25 - 49%     Transfers Chair/bed transfer  Transfers assist     Chair/bed transfer assist level: Moderate Assistance - Patient 50 - 74%     Locomotion Ambulation   Ambulation assist      Assist level: 2 helpers(3 Musketeers) Assistive device: Other (comment)(3 Musketeers) Max distance: 35ft   Walk 10 feet activity   Assist     Assist level: 2 helpers(3 Musketeers) Assistive device: Other (comment)(3 Musketeers)   Walk 50 feet activity   Assist Walk 50 feet with 2 turns activity did not occur: Safety/medical concerns         Walk 150 feet activity   Assist Walk 150 feet activity did not occur: Safety/medical concerns  Walk 10 feet on uneven surface  activity   Assist Walk 10 feet on uneven surfaces activity did not occur: Safety/medical concerns         Wheelchair     Assist Will patient use wheelchair at discharge?: (TBD)             Wheelchair 50 feet with 2 turns activity    Assist            Wheelchair 150 feet activity     Assist            Medical Problem List and Plan: 1.  Altered mental status left hemiparesis secondary to right MCA infarction along with a small left CR and splenium infarct embolic pattern  likely due to transient cardiomyopathy with low EF after cardiac catheterization for non-STEMI with stenting complicated by groin hematoma  Continue CIR  Team conference today to discuss current and goals and coordination of care, home and environmental barriers, and discharge planning with nursing, case manager, and therapies.  2.  Antithrombotics: -DVT/anticoagulation: SCDs             -antiplatelet therapy: Plavix 3. Pain Management: Tylenol as needed 4. Mood: Amantadine 100 mg twice daily, melatonin 3 mg nightly             -antipsychotic agents: N/A 5. Neuropsych: This patient is not capable of making decisions on her own behalf. 6. Skin/Wound Care: Routine skin checks 7. Fluids/Electrolytes/Nutrition: Routine in and outs.  8.  GI bleed/duodenal ulcer.  Status post EGD 10/08/2019 with epi and hemostasis clips x5.  PPI twice daily             See #9 9.  Acute blood loss anemia.    Hemoglobin 9.7 on 4/19  Continue to monitor 10.  AKI.  Resolved.  11.  Acute urinary tract infection.  Patient completed 5-day course amoxicillin 10/17/2019 12.  Post stroke dysphagia.  Dysphagia #1 nectar liquids.  Follow-up speech therapy             Advance diet as tolerated 13.  Keratoconjunctivitis.  Follow-up ophthalmology services.  Completed 7-day course of erythromycin drops on 4/17. 14.  Hypertension.     Increased Coreg to 6.25 mg BID with meals  Labile on 4/21             Monitor with increased mobility 15.  Hyperlipidemia.  Lipitor 16.  Prediabetes.  Hemoglobin A1c 5.9.  SSI  Labile on 4/21 CBG (last 3)  Recent Labs    10/22/19 1626 10/22/19 2145 10/23/19 0627  GLUCAP 98 146* 105*              Monitor with increased mobility 17. Acute systolic CHF   Filed Weights   10/16/19 1900 10/17/19 0500 10/18/19 0301  Weight: 70.4 kg 69.8 kg 70.2 kg   Daily weights ordered again, continue remain pending  Stable on 4/16 18. Hypomagnesemia  Mag 1.5 on 4/19, plan to order labs at the end of the  week  Supplemented IV on 4/19  Supplement initiated 19.  Hypokalemia  Potassium 3.4 on 4/19, will order labs for the end of the week  Supplemented x1 day  LOS: 7 days A FACE TO FACE EVALUATION WAS PERFORMED  Monserrath Junio Lorie Phenix 10/23/2019, 8:49 AM

## 2019-10-23 NOTE — Progress Notes (Signed)
Occupational Therapy Session Note  Patient Details  Name: Brittney Kennedy MRN: [id number] Date of Birth: Nov 30, 1950  Today's Date: 10/23/2019 OT Individual Time: 1100-1130 OT Individual Time Calculation (min): 30 min    Short Term Goals: Week 1:  OT Short Term Goal 1 (Week 1): Pt will be able to use L hand as a stabilizing A with UB bathing. OT Short Term Goal 2 (Week 1): Pt will be able to find items on her food tray on L side with min cues. OT Short Term Goal 3 (Week 1): Pt will complete squat pivot to toilet with mod A. OT Short Term Goal 4 (Week 1): Pt will don a shirt with mod A. OT Short Term Goal 5 (Week 1): Pt will rise to stand with mod A.  Skilled Therapeutic Interventions/Progress Updates:    1:1 Pt up and received in the w/c. Pt requested ice chips. Pt performed grooming at the sink with focus on use of left UE with A as weak nondominant UE with extra time.   In the gym focus on NMR including squat pivot transfers with min A with extra time to problem solve through setup and proper body mechanics. AT EOM focus on sit to stand and maintaining standing balance at midline using the mirror for visual feedback. Without visual feedback or a target to keep her body aligned up pt required mod to max A due to pushing to the left- however with maintaining right hip contact with back of a chair and visual feedback with a mirror able to maintain balance with min  A for ~20 seconds before needing to sit.  Continued focus on functional use of left UE with UE RANGER with focus on control of movement distally and visual attention to UE with activity. Pt with some tightness in supination and difficulty with maintianing her hand on the mat with shoulder extension and elbow flexion.   Transferred back into w/c with min A to her left side with extra time with two squats. Pt left sitting up in the w/c with chair alarm.   Therapy Documentation Precautions:  Precautions Precautions: Fall, Other  (comment) Precaution Comments: aspiration precautions (HOB >30 degrees), significant L inattention/neglect, signifcant L hemiparesis (UE>LE with L shoulder subluxation) Restrictions Weight Bearing Restrictions: No General:   Vital Signs: Therapy Vitals Pulse Rate: 78 BP: (!) 159/50 Pain:  pt c/o mm soreness in right LE from "walking yesterday" but able to continue to rest breaks   Therapy/Group: Individual Therapy  Roney Mans [facility] 10/23/2019, 12:33 PM

## 2019-10-23 NOTE — Progress Notes (Signed)
Occupational Therapy Session Note  Patient Details  Name: Brittney Kennedy MRN: [id number] Date of Birth: 09-25-1950  Today's Date: 10/23/2019 OT Individual Time: 0900-1000 OT Individual Time Calculation (min): 60 min    Short Term Goals: Week 1:  OT Short Term Goal 1 (Week 1): Pt will be able to use L hand as a stabilizing A with UB bathing. OT Short Term Goal 2 (Week 1): Pt will be able to find items on her food tray on L side with min cues. OT Short Term Goal 3 (Week 1): Pt will complete squat pivot to toilet with mod A. OT Short Term Goal 4 (Week 1): Pt will don a shirt with mod A. OT Short Term Goal 5 (Week 1): Pt will rise to stand with mod A.  Skilled Therapeutic Interventions/Progress Updates:  Pt recived supine in bed agreeable to OT intervention. Pt report needing to void bowels urgently. MOD A for bed mobility and MOD A for stand pivot transfer to w/c to pts L side. Pt transported to 3n1 over toilet via w/c with total A. Pt completed similar stand pivot transfer to toilet to pts R side with MOD A. Pt initiated posterior pericare with set- up assist but ultimately required MAX A for cleanliness. Pt transported to sink via w/c with total A where pt completed seated grooming tasks. Pt required supervision for oral care needing verbal cues on how to incorporate LUE as stabilizer. Pt able to recall compensatory method of positioning toothbrush effectively as hemi strategy. Pt required MAX A to don new pants per pt request. Pt left up in w/c with alarm belt activated and all needs within reach.   Therapy Documentation Precautions:  Precautions Precautions: Fall, Other (comment) Precaution Comments: aspiration precautions (HOB >30 degrees), significant L inattention/neglect, signifcant L hemiparesis (UE>LE with L shoulder subluxation) Restrictions Weight Bearing Restrictions: No General:   Vital Signs: Therapy Vitals Pulse Rate: 78 BP: (!) 159/50 Pain: Pt reports no pain during  session.   Therapy/Group: Individual Therapy  Angelina Pih 10/23/2019, 11:54 AM

## 2019-10-23 NOTE — Plan of Care (Signed)
  Problem: Consults Goal: RH STROKE PATIENT EDUCATION Description: See Patient Education module for education specifics  Outcome: Progressing Goal: Nutrition Consult-if indicated Outcome: Progressing   Problem: RH BOWEL ELIMINATION Goal: RH STG MANAGE BOWEL WITH ASSISTANCE Description: STG Manage Bowel with mod I Assistance. Outcome: Progressing Goal: RH STG MANAGE BOWEL W/MEDICATION W/ASSISTANCE Description: STG Manage Bowel with Medication with mod I Assistance. Outcome: Progressing   Problem: RH BLADDER ELIMINATION Goal: RH STG MANAGE BLADDER WITH ASSISTANCE Description: STG Manage Bladder With mod I Assistance Outcome: Progressing   Problem: RH SKIN INTEGRITY Goal: RH STG SKIN FREE OF INFECTION/BREAKDOWN Description: Patient will not have new areas of skin break down  Outcome: Progressing Goal: RH STG MAINTAIN SKIN INTEGRITY WITH ASSISTANCE Description: STG Maintain Skin Integrity With mod I Assistance. Outcome: Progressing   Problem: RH SAFETY Goal: RH STG ADHERE TO SAFETY PRECAUTIONS W/ASSISTANCE/DEVICE Description: STG Adhere to Safety Precautions With Assistance/Device. Outcome: Progressing   Problem: RH COGNITION-NURSING Goal: RH STG USES MEMORY AIDS/STRATEGIES W/ASSIST TO PROBLEM SOLVE Description: STG Uses Memory Aids/Strategies With Assistance to Problem Solve. Outcome: Progressing Goal: RH STG ANTICIPATES NEEDS/CALLS FOR ASSIST W/ASSIST/CUES Description: STG Anticipates Needs/Calls for Assist With Assistance/Cues. Outcome: Progressing   Problem: RH PAIN MANAGEMENT Goal: RH STG PAIN MANAGED AT OR BELOW PT'S PAIN GOAL Description: Pain scale <4/10 Outcome: Progressing   Problem: Consults Goal: RH GENERAL PATIENT EDUCATION Description: See Patient Education module for education specifics. Outcome: Progressing   Problem: RH Vision Goal: RH LTG Vision (Specify) Outcome: Progressing   

## 2019-10-23 NOTE — Patient Care Conference (Signed)
Inpatient RehabilitationTeam Conference and Plan of Care Update Date: 10/23/2019   Time: 11:00 AM    Patient Name: Brittney Kennedy      Medical Record Number: [id number]  Date of Birth: 11-Oct-1950 Sex: Female         Room/Bed: 4W12C/4W12C-01 Payor Info: Payor: MEDICARE / Plan: MEDICARE PART A AND B / Product Type: *No Product type* /    Admit Date/Time:  10/16/2019  4:10 PM  Primary Diagnosis:  Embolic infarction [facility])  Patient Active Problem List   Diagnosis Date Noted  . Hypokalemia   . Hypomagnesemia   . Labile blood pressure   . Labile blood glucose   . Acute systolic congestive heart failure (HCC)   . Essential hypertension   . Keratoconjunctivitis   . Right middle cerebral artery stroke (HCC) 10/16/2019  . Embolic infarction (HCC) 10/16/2019  . Prediabetes   . Benign essential HTN   . Dysphagia, post-stroke   . Duodenal ulcer   . Acute blood loss anemia 10/08/2019  . Upper GI bleed   . Status post coronary artery stent placement   . Malnutrition of moderate degree 10/02/2019  . Cerebral embolism with cerebral infarction 10/01/2019  . CAD (coronary artery disease) 09/27/2019  . Acute systolic CHF (congestive heart failure) (HCC) 09/27/2019  . NSTEMI (non-ST elevated myocardial infarction) (HCC) 09/26/2019    Expected Discharge Date: Expected Discharge Date: 11/14/19  Team Members Present: Physician leading conference: Dr. Maryla Morrow Care Coodinator Present: Lavera Guise, BSW;Deborah Cedric Fishman, RN, BSN, CRRN;Genie Yarelis Ambrosino, RN, MSN Nurse Present: Danella Bernard, LPN PT Present: Woodfin Ganja, PT OT Present: Primitivo Gauze, OT SLP Present: Wanda Plump, SLP PPS Coordinator present : Edson Snowball, PT     Current Status/Progress Goal Weekly Team Focus  Bowel/Bladder   Cont B/B with occasional incontinence of bowel at night, LBM 4/19  Remain continent  PRN toileting   Swallow/Nutrition/ Hydration   supervision  Supervision  water protocol, Dys2 trials    ADL's   MIn A UB dressing, max LB dressing (able to do her own socks), mod A stand pivot into shower, continues to have L neglect, developing AROM in LUE but continues to have anterior subluxation  min A overall with bathing, LB dressing, toileting; supervision with UB dressing, grooming; min A with transfers, standing balance  ADL training, LUE NMR, balance, L side awareness/ visual scanning, cognition, pt/family education   Mobility   mod assist supine<>sit, sit<>stand, and stand pivot transfers without AD; gait training up to 26ft with +2 mod assist via 3 Musketeers and +3 w/c follow  CGA overall at household ambulatory level  transfer training, bed mobility training, standing balance, L LE NMR and stance control, L attention, pt education, gait training   Communication   Mod assist for use of intelligibility strategies  Min A  carryover of intelligibility strategies   Safety/Cognition/ Behavioral Observations  mod A - left neglect, awareness, recall of strategies  min A  attention, recall, problem solving, awareness   Pain   No c/o of pain, prn tylenol  Remain pain free  PRN/QS pain assessments   Skin   No skin breakdown  Remain free of skin breakdown  QS/PRN skin assessments    Rehab Goals Patient on target to meet rehab goals: Yes *See Care Plan and progress notes for long and short-term goals.     Barriers to Discharge  Current Status/Progress Possible Resolutions Date Resolved   Nursing  PT                    OT                  SLP                SW                Discharge Planning/Teaching Needs:  Goal to discharge home      Team Discussion: MD cognition issues, K+ low, supplemented, daily weights needed, BS up/down, BP up/down, has family issues.  RN BS 105, sore throat, L side weakness, L buttock abrasion.  OT min UB B/D, mod LB B/D, L neglect, L inattention, impulsive at times, shou pain.  PT mod bed, mod/max transfers, amb 38' +2 assist.  SLP  D1nectar, water protocol, verbal pro solving good, needs freq cues, goals S cognition, may downgrade goals.   Revisions to Treatment Plan: N/A     Medical Summary Current Status: Altered mental status left hemiparesis secondary to right MCA infarction along with a small left CR and splenium infarct embolic pattern likely due to transient cardiomyopathy with low EF after cardiac catheterization for non-STEMI with stenting complicated by groin hematoma Weekly Focus/Goal: Improve mobility, dysphagia, cognition, BP, PreDM, CHF, hypomagnesemia, hypokalemia  Barriers to Discharge: Medical stability;Behavior   Possible Resolutions to Barriers: Therapies, follow labs, advance diet as tolerated, optimize DM/HTN meds, follow weights   Continued Need for Acute Rehabilitation Level of Care: The patient requires daily medical management by a physician with specialized training in physical medicine and rehabilitation for the following reasons: Direction of a multidisciplinary physical rehabilitation program to maximize functional independence : Yes Medical management of patient stability for increased activity during participation in an intensive rehabilitation regime.: Yes Analysis of laboratory values and/or radiology reports with any subsequent need for medication adjustment and/or medical intervention. : Yes   I attest that I was present, lead the team conference, and concur with the assessment and plan of the team.   Jodell Cipro M 10/23/2019, 2:03 PM   Team conference was held via web/ teleconference due to [place] - 19

## 2019-10-23 NOTE — Progress Notes (Signed)
Speech Language Pathology Daily Session Note  Patient Details  Name: Brittney Kennedy MRN: [id number] Date of Birth: 09-25-1950  Today's Date: 10/23/2019 SLP Individual Time: 1200-1230, 1345-1405 SLP Individual Time Calculation (min): 30 min, 20 min  Short Term Goals: Week 1: SLP Short Term Goal 1 (Week 1): Pt will sustain attention to basic familiar task for ~ 3 minutes with Mod A cues. SLP Short Term Goal 2 (Week 1): With Mod A cues, pt will utilize speech intelligibilty strategies to increase intelligibility to ~ 75% at simple phrase level. SLP Short Term Goal 3 (Week 1): Pt will consume least restrictive diet with minimal overt s/s of aspiration or dysphagia with Min A cues for swallow strategies.  Skilled Therapeutic Interventions: Session 1: Pt was seen for skilled ST intervention targeting goal for advanced diet. SLP facilitated session by providing dys 2 texture solid (pears). Pt exhibited no obvious oral issues - no left pocketing noted. Cough noted x1, likely due to mixed consistency of pears/syrup. Will schedule Dys2 lunch tray for tomorrow to assess readiness to advance solids.  Session 2: For diagnostic treatment, the [employer] Mental Status (SLUMS) examination was administered. Pt scored 22/30, indicating mild neurocognitive deficits. Points were lost on delayed recall, thought organization, and digit reversal. Clock drawing did not exhibit left neglect. Points were lost due to incorrect hand length for requested time.   Pt was left in wheelchair with alarm on, all needs within reach. Continue ST per current plan of care.   Pain Pain Assessment Pain Scale: 0-10 Pain Score: 0-No pain  Therapy/Group: Individual Therapy   Luan Maberry B. Murvin Natal, [facility], CCC-SLP Speech Language Pathologist  Leigh Aurora 10/23/2019, 2:07 PM

## 2019-10-23 NOTE — Progress Notes (Signed)
Social Work Patient ID: Brittney Kennedy, female   DOB: February 15, 1951, 69 y.o.   MRN: [id number]   Sw went in to give patient team conference updates. Patient currently with therapy. Sw followed up with son. Informed son of updates from MD. Buddy Duty and therapy. Son concerned about patients cognition and mental state. Sw will follow up with neuropsyc. Son pleased and informed SW he would be coming to visit her today as well.

## 2019-10-24 ENCOUNTER — Inpatient Hospital Stay (HOSPITAL_COMMUNITY): Payer: Medicare Other | Admitting: *Deleted

## 2019-10-24 ENCOUNTER — Inpatient Hospital Stay (HOSPITAL_COMMUNITY): Payer: Medicare Other

## 2019-10-24 ENCOUNTER — Inpatient Hospital Stay (HOSPITAL_COMMUNITY): Payer: Medicare Other | Admitting: Occupational Therapy

## 2019-10-24 ENCOUNTER — Inpatient Hospital Stay (HOSPITAL_COMMUNITY): Payer: Medicare Other | Admitting: Speech Pathology

## 2019-10-24 DIAGNOSIS — E46 Unspecified protein-calorie malnutrition: Secondary | ICD-10-CM

## 2019-10-24 DIAGNOSIS — E8809 Other disorders of plasma-protein metabolism, not elsewhere classified: Secondary | ICD-10-CM

## 2019-10-24 LAB — GLUCOSE, CAPILLARY
Glucose-Capillary: 104 mg/dL — ABNORMAL HIGH (ref 70–99)
Glucose-Capillary: 133 mg/dL — ABNORMAL HIGH (ref 70–99)
Glucose-Capillary: 132 mg/dL — ABNORMAL HIGH (ref 70–99)
Glucose-Capillary: 169 mg/dL — ABNORMAL HIGH (ref 70–99)

## 2019-10-24 MED ORDER — PRO-STAT SUGAR FREE PO LIQD
30.0000 mL | Freq: Two times a day (BID) | ORAL | Status: DC
  Administered 2019-10-24 – 2019-11-13 (×37): 30 mL via ORAL
  Filled 2019-10-24 (×41): qty 30

## 2019-10-24 MED ORDER — LISINOPRIL 2.5 MG PO TABS: 2.5000 mg | ORAL_TABLET | Freq: Every day | ORAL | Status: DC

## 2019-10-24 MED ORDER — LOSARTAN POTASSIUM 25 MG PO TABS
12.5000 mg | ORAL_TABLET | Freq: Every day | ORAL | Status: DC
  Administered 2019-10-24 – 2019-10-28 (×4): 12.5 mg via ORAL
  Filled 2019-10-24 (×5): qty 0.5

## 2019-10-24 NOTE — Progress Notes (Addendum)
Advanced Heart Failure Rounding Note  PCP-Cardiologist: Julien Nordmann, MD   Subjective:   cath 09/27/19 3v CAD. EF 20% - right groin hematoma. U/s 3/27 negative for PSA - 3/28 altered mental status due to pain meds. Head CT, ABG and ammonia normal - 09/30/19 CVA, ACS with DES LAD and mid CX - 3/30 Headache with stat CT. EEG- no seizure, cortical dysfunction in the right hemisphere likely secondary to underlying infarct as well as moderate to severe diffuse encephalopathy, nonspecific to etiology. - 3/30 Echo EF 50-55% - 4/6 developed GIB. Hgb 7.3. Transfused x 2 units. Urgent EGD 4/6 showed an actively bleeding duodenal bulb ulcer with visible vessel, treated with epi and hemostasis clips x 5. Hemostasis achieved  -4/7 Got another unit of RBCs. Hgb up to 9.1. Placed on protonix drip.     Making progress in CIR. Currently working w/ OT. Son-in law at bedside. Therapist reports that she is making gains and strength improving. Able to use left arm more. Speech improved.   No cardiac issues. Tolerating meds ok. No dizziness nor hypotension. HR ok w/ therapy. Denies CP. No dyspnea. Her BP however has been running high, 150s-160s systolic.    Objective:   Weight Range: 69 kg Body mass index is 27.82 kg/m.   Vital Signs:   Temp:  [97.7 F (36.5 C)-98.2 F (36.8 C)] 98.1 F (36.7 C) (04/22 0428) Pulse Rate:  [68-72] 72 (04/22 0745) Resp:  [18] 18 (04/22 0428) BP: (146-178)/(56-73) 165/63 (04/22 0745) SpO2:  [98 %-100 %] 100 % (04/22 0745) Weight:  [69 kg] 69 kg (04/22 0428) Last BM Date: 10/22/19  Weight change: Filed Weights   10/17/19 0500 10/18/19 0301 10/24/19 0428  Weight: 69.8 kg 70.2 kg 69 kg    Intake/Output:   Intake/Output Summary (Last 24 hours) at 10/24/2019 1157 Last data filed at 10/24/2019 0830 Gross per 24 hour  Intake 200 ml  Output 150 ml  Net 50 ml      Physical Exam    General:  WF in wheel chair working w/ OT.  No resp difficulty HEENT:  Normal. Mild left facial droop anicteric Neck: Supple. No JVD . Carotids 2+ bilat; no bruits. No lymphadenopathy or thyromegaly appreciated. Cor: PMI nondisplaced. RRR no m,r,g Lungs: clear  Abdomen: Soft, nontender, nondistended. No hepatosplenomegaly. No bruits or masses. Good bowel sounds. Extremities: No cyanosis, clubbing, rash, edema. LUE flaccid. LLE weak  Neuro: Alert & oriented 3, mild left facial droop w/ some residual dysarthria . Affect pleasant    Labs    CBC No results for input(s): WBC, NEUTROABS, HGB, HCT, MCV, PLT in the last 72 hours. Basic Metabolic Panel No results for input(s): NA, K, CL, CO2, GLUCOSE, BUN, CREATININE, CALCIUM, MG, PHOS in the last 72 hours. Liver Function Tests No results for input(s): AST, ALT, ALKPHOS, BILITOT, PROT, ALBUMIN in the last 72 hours. No results for input(s): LIPASE, AMYLASE in the last 72 hours. Cardiac Enzymes No results for input(s): CKTOTAL, CKMB, CKMBINDEX, TROPONINI in the last 72 hours.  BNP: BNP (last 3 results) No results for input(s): BNP in the last 8760 hours.  ProBNP (last 3 results) No results for input(s): PROBNP in the last 8760 hours.   D-Dimer No results for input(s): DDIMER in the last 72 hours. Hemoglobin A1C No results for input(s): HGBA1C in the last 72 hours. Fasting Lipid Panel No results for input(s): CHOL, HDL, LDLCALC, TRIG, CHOLHDL, LDLDIRECT in the last 72 hours. Thyroid Function Tests No results  for input(s): TSH, T4TOTAL, T3FREE, THYROIDAB in the last 72 hours.  Invalid input(s): FREET3  Other results:   Imaging    No results found.   Medications:     Scheduled Medications: . amantadine  100 mg Oral BID  . atorvastatin  80 mg Oral q1800  . carvedilol  6.25 mg Oral BID WC  . clopidogrel  75 mg Oral Q breakfast  . docusate sodium  100 mg Oral BID  . feeding supplement  1 Container Oral BID BM  . feeding supplement (PRO-STAT SUGAR FREE 64)  30 mL Oral BID  . insulin aspart   0-15 Units Subcutaneous TID WC  . losartan  12.5 mg Oral Daily  . magnesium gluconate  500 mg Oral Daily  . melatonin  3 mg Oral QHS  . multivitamin with minerals  1 tablet Oral Daily  . pantoprazole  40 mg Oral BID    Infusions:   PRN Medications: acetaminophen **OR** acetaminophen, nitroGLYCERIN, polyethylene glycol, polyvinyl alcohol, Resource ThickenUp Clear, senna-docusate     Assessment/Plan   1. CAD with NSTEMI - cath with severe diffuse 3v CAD particularly on left side with severe LV dysfunction. Ideally will need CABG, however in looking at her films worry about the quality of her LAD target and also not sure she would benefit much from a graft to the RCA. Concerned about her mobility especially after recent fall an groin hematoma and low albumin - s/p PCI of pLAD w/ DES x 1 + PCI of mLCX w/ DES x 1 - CMRI- LVEF 29% RV 37% Global hypokinesis.  - 2D echo 10/02/19 showed improved EF, up to 50-55% - stable w/o CP   - continue Plavix and statin. Hold aspirin with recent GI bleed.  - on  blocker therapy w/ coreg   2. Acute systolic HF due to NICM - Initial EF ~25% - repeat echo 3/31, EF normalized, 50-55% - Volume status stable. Wt stable. No diuretic requirements  - Per neurology allow SBP 130-150s. SBPs elevated in the 160s - Continue low dose carvedilol  6.25 mg bid] - Add low dose Losartan 12.5 mg daily. Consider later transition to low dose Entresto - Check BMP today   3. R groin hematoma after fall - has hematoma at cath site.  - u/s negative for PSA - CT + hematoma. No mention of fracture but not completely imaged. - Resolved.   4. Hypokalemia/Hypomag - Last BMP was 4/19  - Will repeat BMP and Mg lab today   5. Severe protein calorie malnutrition  - albumin 2.3   6 . Stroke, Acute  -CT 3/28 No acute findings  -CT 3/29 at 0500 R basal infact, small r pontine infact.  -CT 3/30 - Acute infarct centered in the right parietal lobe correlating with  perfusion defects. Age-indeterminate infarct at the right caudate that is stable from prior exam. Continues to improve.   -PT/OT/Speech on CIR   7. NSVT - Continue carvedilol  - Keep electrolytes stable. Check BMP and Mg today  8. Anemia, GI bleed.  H/O gastric ulcers years ago per son.  - Urgent EGD 4/6 showed an actively bleeding duodenal bulb ulcer with visible vessel, treated with epi and hemostasis clips x 5. Hemostasis achieved  - No further bleeding. Continue protonix 40 mg twice a day.  - ASA discontinued. Remains on Plavix for recently placed coronary DES    Length of Stay: [address] [place]  10/24/2019, 11:57 AM  Advanced Heart Failure Team  Pager [phone] (M-F; 7a - 4p)  Please contact [facility] for night-coverage after hours (4p -7a ) and weekends on [email or web address]   Patient seen and examined with the above-signed Advanced Practice Provider and/or Housestaff. I personally reviewed laboratory data, imaging studies and relevant notes. I independently examined the patient and formulated the important aspects of the plan. I have edited the note to reflect any of my changes or salient points. I have personally discussed the plan with the patient and/or family.  Stroke therapy progressing well.  No CP or SOB. BP remains elevated. Agree with losartan.   Continue DAPT and statin. Exam stable.   Arvilla Meres, MD  6:17 PM

## 2019-10-24 NOTE — Progress Notes (Signed)
Physical Therapy Weekly Progress Note  Patient Details  Name: Brittney Kennedy MRN: [id number] Date of Birth: [birth year]  Beginning of progress report period: October 17, 2019 End of progress report period: October 24, 2019  Today's Date: 10/24/2019 PT Individual Time: 1445-1530 and 800-900 PT Individual Time Calculation (min): 45 min and 60 min  Patient has met 3 of 3 short term goals.  She is making good progress w/all functional mobility tasks and her most significant limiting factor is her L inattention with poor endurance being the next limiting factor.  Overall safety awareness is also a concern.  She has had supportive family present and willing to participate in sessions.  Patient continues to demonstrate the following deficits muscle weakness, decreased cardiorespiratoy endurance, impaired timing and sequencing, unbalanced muscle activation, decreased coordination and decreased motor planning, decreased midline orientation, decreased attention to left and decreased motor planning and decreased sitting balance, decreased standing balance, decreased postural control, hemiplegia and decreased balance strategies and therefore will continue to benefit from skilled PT intervention to increase functional independence with mobility.  Patient progressing toward long term goals..  Continue plan of care.  PT Short Term Goals Week 1:  PT Short Term Goal 1 (Week 1): Pt will perform supine<>sit with max assist of 1 PT Short Term Goal 2 (Week 1): Pt will perform sit<>stands with max A of 1 PT Short Term Goal 3 (Week 1): Pt will perform bed<>chair transfers with max assist of 1 Week 2:  PT Short Term Goal 1 (Week 2): Supine to sit w/cga anc veral cues using bed features PT Short Term Goal 2 (Week 2): wc to/from bed w/min assist and cues w/LRAD PT Short Term Goal 3 (Week 2): Gait 43f w/ mod assist w/LRAD  Skilled Therapeutic Interventions/Progress Updates:  Ambulation/gait training;Community  reintegration;DME/adaptive equipment instruction;Neuromuscular re-education;Psychosocial support;UE/LE Strength taining/ROM;Stair training;Wheelchair propulsion/positioning;Balance/vestibular training;Discharge planning;Functional electrical stimulation;Pain management;Skin care/wound management;Therapeutic Activities;UE/LE Coordination activities;Cognitive remediation/compensation;Disease management/prevention;Functional mobility training;Patient/family education;Splinting/orthotics;Therapeutic Exercise;Visual/perceptual remediation/compensation  Am session: Pain - states legs are sore but described as muscle soreness from increased activity.  Decreased pain as activity increased.   Pt initially  Supine w/strong R gaze preference.  Requires verbal cueing to scan fully to L to locate therapist.  Supine to sit on edge of bed w/max verbal cueing for sequencing and use of L exts, min assist.   Static sitting w/cga, performed reaching w/cga w/minimal excursions.  STS w/min assist from edge of bed, mod assist for balance in standing and tactile/verbal cues for activation of L quads/gluts.  Repeateed STS x 3. Worked on static stand at edge of bed w/cues as above. Squat pvt transfer bed to wc w/mod assist.  Gait trials w/RW as follows: STS w/max cues for sequencing/hand placement/ant wt shift w/transition.  Gait 143fx 1 w/mod assist w/RW tends to step to or across midline w/LLE, mild L lean, poor attention to L environment, decreased activation of L quads/gluts w/stance w/increased knee flexion and occasional "dipping" vs buckling at midstance.  Mod assist to maintain straight path w/walker due to decreased attention and use of LUE.  Gait 3034f/deviations/assist as above, improved endurance w/distractions provided in hallway.  Standing activity: Using mirror and verbal cues for feedback worked on STS and wt shifting to R via reaching, second effort included reaching task moving clothespins to improve  focus/distraction from discomfort.  Leans L/worked on wt shift R and activation of LLE in standing.   Pm session: Denies pain this pm  Pt initially sleeping in R sidelying.  Slow to awaken.  Rolls L and side to sit from L w/mod assist.  Gradual increase in awareness in sitting.  Pt requested to use BR.  STS w/mod assist of 1 to RW and cues to place L hand on walker grip.  Gait 45f to commode and commode transfer w/mod assist, deviations as in previous session, max cues for safety w/fully completing turning and backing to commode. Pt continent of urine and performs hygiene w/set up and min assist for balance on commode when wt shifting.    Pt then performed visual scanning of L environment to locate people/items in room and son-in-law educated re:  L inattention.  STS w/mod assist and gait to wc w/walker, max cues for safety w/transfer to wc. Pt transported to gym for continuation of session.  STS from wc w/min assist and cues for midline wbing. Turns in bars w/mod to max assist due to increased L knee flexion w/resulting L lean requiring max assist for recovery.  Pt then performs sidestepping in bars using mirrir for feedback and tapping to improve attention to l knee extension w/wt shifting onto L limb. Sidestepping 5 steps as above w/ mod to min assist, repeated after seated rest w/min assist and verbal/tactile cues for L LE stabilization w/wt shifting to L during task, max cues for safety w/turn to sit. Pt transported to room at end of session.  Pt left oob in wc w/alarm belt set and needs in reach     Ambulation/gait training;Community reintegration;DME/adaptive equipment instruction;Neuromuscular re-education;Psychosocial support;UE/LE Strength taining/ROM;Stair training;Wheelchair propulsion/positioning;Balance/vestibular training;Discharge planning;Functional electrical stimulation;Pain management;Skin care/wound management;Therapeutic Activities;UE/LE Coordination  activities;Cognitive remediation/compensation;Disease management/prevention;Functional mobility training;Patient/family education;Splinting/orthotics;Therapeutic Exercise;Visual/perceptual remediation/compensation  Therapy Documentation Precautions:  Precautions Precautions: Fall, Other (comment) Precaution Comments: aspiration precautions (HOB >30 degrees), significant L inattention/neglect, signifcant L hemiparesis (UE>LE with L shoulder subluxation) Restrictions Weight Bearing Restrictions: No   Therapy/Group: Individual Therapy  BCallie Fielding PWinigan4/22/2021, 3:50 PM

## 2019-10-24 NOTE — Progress Notes (Signed)
Speech Language Pathology Weekly Progress and Session Note  Patient Details  Name: Brittney Kennedy MRN: [id number] Date of Birth: [birth year]  Beginning of progress report period: October 17, 2019 End of progress report period: October 24, 2019  Today's Date: 10/24/2019 SLP Individual Time: 8127-5170 SLP Individual Time Calculation (min): 44 min  Short Term Goals: Week 1: SLP Short Term Goal 1 (Week 1): Pt will sustain attention to basic familiar task for ~ 3 minutes with Mod A cues. SLP Short Term Goal 1 - Progress (Week 1): Met SLP Short Term Goal 2 (Week 1): With Mod A cues, pt will utilize speech intelligibilty strategies to increase intelligibility to ~ 75% at simple phrase level. SLP Short Term Goal 2 - Progress (Week 1): Met SLP Short Term Goal 3 (Week 1): Pt will consume least restrictive diet with minimal overt s/s of aspiration or dysphagia with Min A cues for swallow strategies. SLP Short Term Goal 3 - Progress (Week 1): Met    New Short Term Goals: Week 2: SLP Short Term Goal 1 (Week 2): Pt will sustain attention to basic familiar task for ~ 5 minutes with Min A cues. SLP Short Term Goal 2 (Week 2): With Mod A cues, pt will utilize speech intelligibilty strategies to increase intelligibility to ~ 85% at sentence level. SLP Short Term Goal 3 (Week 2): Pt will consume least restrictive diet with efficient mastication and oral clearance with Supervision A cues for swallow strategies. SLP Short Term Goal 4 (Week 2): Pt will demonstrate readiness for repeat MBSS by consuming trials of thin via water protocol with minimal overt s/sx aspiration over 2 more consecutive sessions.  Weekly Progress Updates: Pt has made functional gains and met 3 out of 3 short term goals this reporting period. Pt is currently moderate assist for basic cognitive tasks due to impairments primarily impacting her sustained attention and problem solving. Pt is ~80% intelligible at the conversation level and although  she uses speech intelligibility strategies with Min A at phrase level, she requires more assistance for carryover of strategies at sentence and conversation level. Pt is consuming an upgraded dysphagia 2 diet with nectar thick liquids with Min A cues for use of compensatory swallow strategies. Pt is also on the free water protocol and progressing toward repeat MBSS to assess potential for liquid advancement. Pt and family education is ongoing. Pt would continue to benefit from skilled ST while inpatient in order to maximize functional independence and reduce burden of care prior to discharge. Anticipate that pt will need 24/7 supervision at discharge in addition to [place] follow up at next level of care.      Intensity: Minumum of 1-2 x/day, 30 to 90 minutes Frequency: 3 to 5 out of 7 days Duration/Length of Stay: 11/14/19 Treatment/Interventions: Functional tasks;Cognitive remediation/compensation;Patient/family education;Dysphagia/aspiration precaution training;Cueing hierarchy;Internal/external aids;Speech/Language facilitation   Daily Session  Skilled Therapeutic Interventions: Pt was seen for skilled ST targeting dysphagia goals. Pt's family member was also present for session Valarie Merino) and was trained to provide appropriate cueing so that he can provide full supervision of meals. Pt consumed an upgraded trial tray of dysphagia 2 solids with overall Min A verbal cues for use of compensatory strategies, primarily for smaller bite size and 1 for oral clearance of L buccal pocketing. Pt demonstrated functional and efficient mastication and oral clearance throughout intake, and would recommend she upgrade to Dysphagia 2 solid textures, continue nectar thick liquids and water protocol. Pt left sitting in wheelchair with seatbelt alarm in  place and needs met to her satisfaction, family still present. Continue per current plan of care.            Pain Pain Assessment Pain Scale: 0-10 Pain Score: 0-No  pain  Therapy/Group: Individual Therapy  Arbutus Leas 10/24/2019, 7:23 AM

## 2019-10-24 NOTE — Telephone Encounter (Signed)
Patient continues to be under inpatient care as of 10/24/19. Will continue to follow.

## 2019-10-24 NOTE — Progress Notes (Addendum)
[place] PHYSICAL MEDICINE & REHABILITATION PROGRESS NOTE  Subjective/Complaints: Patient seen sitting up in bed this AM, eating breakfast.  She states she slept well overnight.  No reported issues overnight.  She states her son wants to call me, but he is at work right now, so he will call later.   ROS: ? Reliability, denies CP, shortness of breath, nausea, vomiting, diarrhea.  Objective: Vital Signs: Blood pressure (!) 165/63, pulse 72, temperature 98.1 F (36.7 C), temperature source Oral, resp. rate 18, height 5\' 2"  (1.575 m), weight 69 kg, SpO2 100 %. No results found. No results for input(s): WBC, HGB, HCT, PLT in the last 72 hours. No results for input(s): NA, K, CL, CO2, GLUCOSE, BUN, CREATININE, CALCIUM in the last 72 hours.  Physical Exam: BP (!) 165/63 (BP Location: Left Arm)   Pulse 72   Temp 98.1 F (36.7 C) (Oral)   Resp 18   Ht 5\' 2"  (1.575 m)   Wt 69 kg   SpO2 100%   BMI 27.82 kg/m  Constitutional: No distress . Vital signs reviewed. HENT: Normocephalic.  Atraumatic. Eyes: EOMI. No discharge. Cardiovascular: No JVD. Respiratory: Normal effort.  No stridor. GI: Non-distended. Skin: Warm and dry.  Intact. Psych: Confusion appears to be improving Musc: No edema in extremities.  No tenderness in extremities. Left shoulder subluxation.  Neurological: Alert and oriented, except for date of month Dysarthria, unchanged Facial weakness Examination limited due willingness to participate   Motor: LUE: 4/5 proximal to distal RLE: 4+/5 proximal to distal LLE: HF, KE 4+/5, ADF 4+/5  Assessment/Plan: 1. Functional deficits secondary to bilateral embolic infarcts.  Which require 3+ hours per day of interdisciplinary therapy in a comprehensive inpatient rehab setting.  Physiatrist is providing close team supervision and 24 hour management of active medical problems listed below.  Physiatrist and rehab team continue to assess barriers to discharge/monitor patient  progress toward functional and medical goals  Care Tool:  Bathing    Body parts bathed by patient: Left arm, Chest, Abdomen, Front perineal area, Right upper leg, Left upper leg, Face   Body parts bathed by helper: Right arm Body parts n/a: Face   Bathing assist Assist Level: Set up assist     Upper Body Dressing/Undressing Upper body dressing   What is the patient wearing?: Pull over shirt    Upper body assist Assist Level: Minimal Assistance - Patient > 75%    Lower Body Dressing/Undressing Lower body dressing      What is the patient wearing?: Pants     Lower body assist Assist for lower body dressing: Maximal Assistance - Patient 25 - 49%     Toileting Toileting    Toileting assist Assist for toileting: Maximal Assistance - Patient 25 - 49%     Transfers Chair/bed transfer  Transfers assist     Chair/bed transfer assist level: Moderate Assistance - Patient 50 - 74%     Locomotion Ambulation   Ambulation assist      Assist level: 2 helpers(3 Musketeers) Assistive device: Other (comment)(3 Musketeers) Max distance: 59ft   Walk 10 feet activity   Assist     Assist level: 2 helpers(3 Musketeers) Assistive device: Other (comment)(3 Musketeers)   Walk 50 feet activity   Assist Walk 50 feet with 2 turns activity did not occur: Safety/medical concerns         Walk 150 feet activity   Assist Walk 150 feet activity did not occur: Safety/medical concerns  Walk 10 feet on uneven surface  activity   Assist Walk 10 feet on uneven surfaces activity did not occur: Safety/medical concerns         Wheelchair     Assist Will patient use wheelchair at discharge?: (TBD)             Wheelchair 50 feet with 2 turns activity    Assist            Wheelchair 150 feet activity     Assist            Medical Problem List and Plan: 1.  Altered mental status left hemiparesis secondary to right MCA  infarction along with a small left CR and splenium infarct embolic pattern likely due to transient cardiomyopathy with low EF after cardiac catheterization for non-STEMI with stenting complicated by groin hematoma  Continue CIR 2.  Antithrombotics: -DVT/anticoagulation: SCDs             -antiplatelet therapy: Plavix 3. Pain Management: Tylenol as needed 4. Mood: Amantadine 100 mg twice daily, melatonin 3 mg nightly             -antipsychotic agents: N/A 5. Neuropsych: This patient is not capable of making decisions on her own behalf. 6. Skin/Wound Care: Routine skin checks 7. Fluids/Electrolytes/Nutrition: Routine in and outs.  8.  GI bleed/duodenal ulcer.  Status post EGD 10/08/2019 with epi and hemostasis clips x5.  PPI twice daily             See #9 9.  Acute blood loss anemia.    Hemoglobin 9.7 on 4/19  Continue to monitor 10.  AKI.  Resolved.  11.  Acute urinary tract infection.  Patient completed 5-day course amoxicillin 10/17/2019 12.  Post stroke dysphagia.  Dysphagia #2 nectar liquids.  Follow-up speech therapy             Advance diet as tolerated 13.  Keratoconjunctivitis.  Follow-up ophthalmology services.  Completed 7-day course of erythromycin drops on 4/17. 14.  Hypertension.     Increased Coreg to 6.25 mg BID with meals  Discussed with pharmacy, started on Losartan 12.5, with eventual plans for Entresto 15.  Hyperlipidemia.  Lipitor 16.  Prediabetes.  Hemoglobin A1c 5.9.  SSI  Labile on 4/22 CBG (last 3)  Recent Labs    10/23/19 1653 10/23/19 2125 10/24/19 0606  GLUCAP 95 153* 104*              Monitor with increased mobility 17. Acute systolic CHF   Filed Weights   10/17/19 0500 10/18/19 0301 10/24/19 0428  Weight: 69.8 kg 70.2 kg 69 kg   Stable on 4/22 18. Hypomagnesemia  Mag 1.5 on 4/19, labs ordered for tomorrow  Supplemented IV on 4/19  Supplement initiated 19.  Hypokalemia  Potassium 3.4 on 4/19, labs ordered for tomorrow  Supplemented x1 day 20.  Mod/Severe Hypoalbuminemia  Supplement initiated  LOS: 8 days A FACE TO FACE EVALUATION WAS PERFORMED  Jarrad Mclees Karis Juba 10/24/2019, 10:42 AM

## 2019-10-24 NOTE — Progress Notes (Signed)
Occupational Therapy Weekly Progress Note  Patient Details  Name: Brittney Kennedy MRN: [id number] Date of Birth: 1950/07/22  Beginning of progress report period: October 17, 2019 End of progress report period: October 24, 2019  Today's Date: 10/24/2019 OT Individual Time: 1015-1115 (1115- 1215 unattended estim) OT Individual Time Calculation (min): 60 min    Patient has met 4 of 5 short term goals.  Pt is making excellent progress with her LLE strength to allow her to rise to stand with CGA to min A, stand pivot with min -mod A and maintain stand balance with min A so she can use her R arm to adjust pants over hips with min A and cleanse post toileting.  She continues to have decreased L side awareness/ attention, attention to tasks, somewhat impulsive, limited LUE active movement. Her tone is increasing in LUE and her sh subluxation has reduced from 1 finger width to 1/2 finger width. Due to decreased attention/ awareness, she has not yet been able to use LUE as a stabilizing A with bathing despite improved finger flexion. Pt is not able to elicit active finger flex and ext or sh flex on command but when therapist places his/her hand in pt;'s hand, she can squeeze her hand and even push her arm forward.    Patient continues to demonstrate the following deficits: decreased cardiorespiratoy endurance, abnormal tone, unbalanced muscle activation and motor apraxia, decreased visual acuity, decreased visual perceptual skills, decreased visual motor skills and field cut, decreased attention to left, decreased attention, decreased awareness, decreased problem solving, decreased memory and delayed processing and decreased sitting balance, decreased standing balance, decreased postural control, hemiplegia and decreased balance strategies and therefore will continue to benefit from skilled OT intervention to enhance overall performance with BADL.  Patient progressing toward long term goals..  Continue plan of  care.  OT Short Term Goals Week 1:  OT Short Term Goal 1 (Week 1): Pt will be able to use L hand as a stabilizing A with UB bathing. OT Short Term Goal 1 - Progress (Week 1): Progressing toward goal OT Short Term Goal 2 (Week 1): Pt will be able to find items on her food tray on L side with min cues. OT Short Term Goal 2 - Progress (Week 1): Met OT Short Term Goal 3 (Week 1): Pt will complete squat pivot to toilet with mod A. OT Short Term Goal 3 - Progress (Week 1): Met OT Short Term Goal 4 (Week 1): Pt will don a shirt with mod A. OT Short Term Goal 4 - Progress (Week 1): Met OT Short Term Goal 5 (Week 1): Pt will rise to stand with mod A. OT Short Term Goal 5 - Progress (Week 1): Met Week 2:  OT Short Term Goal 1 (Week 2): Pt will demonstrate improved attention and ability to follow directions to don shirt with verbal cues and set up. OT Short Term Goal 2 (Week 2): Pt will don pants over L foot with steadying A. OT Short Term Goal 3 (Week 2): Pt will use L hand as a stabilizing A when opening/closing containers. OT Short Term Goal 4 (Week 2): Pt will be able to lift L shoulder to 45 degrees abduction to be able to wash under arm pit easily.  Skilled Therapeutic Interventions/Progress Updates:    Pt received in wc stating she was very tired but agreeable to therapy. Her son in law present and provided with education on what OT is focusing on. He would like to  be able to help her with meals. Will check with SLP therapist.   Pt worked on w/c to toilet transfer (bsc over toilet) with a stand pivot with therapist supporting L arm. With R hand on bar, she stood and stepped around to toilet with min A with cues to fully step L foot back.  With min A to support her balance, pt able to use R hand to pull pants down, cleanse and pull pants up with min A.   Good sitting balance on toilet.  Transferred back to w/c to dress.  Min -mod A with donning shirt mostly bc pt was rushing through the steps and  not going in sequential order to make donning the shirt easier (max cues with steps, especially to attend to L side).  Pt washed under arms, with hand over hand A with L hand. She is not able to use it actively enough to use as a stabilizing A with bathing.    Pt taken out to room and set up with estim to forearm for 10 min to stimulate finger and wrist extensors and for sensory stimulation using Saebo stim.   Place lap tray on chair and had pt work on active extension with stimulation.   Moved estim to shoulder for passive cyclic estim to reduce subluxation.  Estim on shoulder unattended for 60 min and pt tolerated well. Before leaving the room at end of session, educated pt and son in law on how pt can work on arom by sliding arm on tray and flex and ext of hands by squeezing wash cloth.  When therapist returned to room to remove estim, pt actively working on her hand ROM, taking good initiative! Pt in wc with quick release belt on and all needs met.    Therapy Documentation Precautions:  Precautions Precautions: Fall, Other (comment) Precaution Comments: aspiration precautions (HOB >30 degrees), significant L inattention/neglect, signifcant L hemiparesis (UE>LE with L shoulder subluxation) Restrictions Weight Bearing Restrictions: No    Vital Signs: Therapy Vitals Pulse Rate: 72 BP: (!) 165/63 Patient Position (if appropriate): Lying Oxygen Therapy SpO2: 100 % O2 Device: Room Air Pain: Pain Assessment Pain Score: 0-No pain ADL: ADL Grooming: Setup Where Assessed-Grooming: Sitting at sink Upper Body Bathing: Minimal assistance Where Assessed-Upper Body Bathing: Shower Lower Body Bathing: Moderate assistance Where Assessed-Lower Body Bathing: Shower Upper Body Dressing: Minimal assistance Where Assessed-Upper Body Dressing: Wheelchair Lower Body Dressing: Moderate assistance Where Assessed-Lower Body Dressing: Wheelchair Toileting: Moderate assistance Where Assessed-Toileting:  Glass blower/designer: Moderate assistance Toilet Transfer Method: Stand pivot Toilet Transfer Equipment: Grab bars, Raised toilet seat Social research officer, government: Moderate assistance Social research officer, government Method: Radiographer, therapeutic: Shower seat with back, Grab bars    Therapy/Group: Individual Therapy  [place] 10/24/2019, 11:40 AM

## 2019-10-24 NOTE — Plan of Care (Signed)
  Problem: Consults Goal: RH STROKE PATIENT EDUCATION Description: See Patient Education module for education specifics  Outcome: Progressing Goal: Nutrition Consult-if indicated Outcome: Progressing   Problem: RH BOWEL ELIMINATION Goal: RH STG MANAGE BOWEL WITH ASSISTANCE Description: STG Manage Bowel with mod I Assistance. Outcome: Progressing Goal: RH STG MANAGE BOWEL W/MEDICATION W/ASSISTANCE Description: STG Manage Bowel with Medication with mod I Assistance. Outcome: Progressing   Problem: RH BLADDER ELIMINATION Goal: RH STG MANAGE BLADDER WITH ASSISTANCE Description: STG Manage Bladder With mod I Assistance Outcome: Progressing   Problem: RH SKIN INTEGRITY Goal: RH STG SKIN FREE OF INFECTION/BREAKDOWN Description: Patient will not have new areas of skin break down  Outcome: Progressing Goal: RH STG MAINTAIN SKIN INTEGRITY WITH ASSISTANCE Description: STG Maintain Skin Integrity With mod I Assistance. Outcome: Progressing   Problem: RH SAFETY Goal: RH STG ADHERE TO SAFETY PRECAUTIONS W/ASSISTANCE/DEVICE Description: STG Adhere to Safety Precautions With Assistance/Device. Outcome: Progressing   Problem: RH COGNITION-NURSING Goal: RH STG USES MEMORY AIDS/STRATEGIES W/ASSIST TO PROBLEM SOLVE Description: STG Uses Memory Aids/Strategies With Assistance to Problem Solve. Outcome: Progressing Goal: RH STG ANTICIPATES NEEDS/CALLS FOR ASSIST W/ASSIST/CUES Description: STG Anticipates Needs/Calls for Assist With Assistance/Cues. Outcome: Progressing   Problem: RH PAIN MANAGEMENT Goal: RH STG PAIN MANAGED AT OR BELOW PT'S PAIN GOAL Description: Pain scale <4/10 Outcome: Progressing   Problem: RH KNOWLEDGE DEFICIT Goal: RH STG INCREASE KNOWLEDGE OF HYPERTENSION Description: Patient will maintain SBP less than 150 Outcome: Progressing Goal: RH STG INCREASE KNOWLEDGE OF DYSPHAGIA/FLUID INTAKE Description: Patient will tolerate full diet with thin liquids Outcome:  Progressing Goal: RH STG INCREASE KNOWLEDGE OF STROKE PROPHYLAXIS Description: Patient will adhere to medication regimen and dietary modifications Outcome: Progressing   Problem: Consults Goal: RH GENERAL PATIENT EDUCATION Description: See Patient Education module for education specifics. Outcome: Progressing   Problem: RH Vision Goal: RH LTG Vision (Specify) Outcome: Progressing

## 2019-10-24 NOTE — Evaluation (Signed)
Recreational Therapy Assessment and Plan  Patient Details  Name: Brittney Kennedy MRN: [id number] Date of Birth: [birth year] Today's Date: 10/24/2019  Rehab Potential:  Good ELOS:  d/c 5/13  Assessment Problem List:      Patient Active Problem List   Diagnosis Date Noted  . Acute systolic congestive heart failure ([place])   . Essential hypertension   . Keratoconjunctivitis   . Right middle cerebral artery stroke ([place]) 10/16/2019  . Embolic infarction ([place]) 10/16/2019  . Prediabetes   . Benign essential HTN   . Dysphagia, post-stroke   . Duodenal ulcer   . Acute blood loss anemia 10/08/2019  . Upper GI bleed   . Status post coronary artery stent placement   . Malnutrition of moderate degree 10/02/2019  . Cerebral embolism with cerebral infarction 10/01/2019  . CAD (coronary artery disease) 09/27/2019  . Acute systolic CHF (congestive heart failure) ([place]) 09/27/2019  . NSTEMI (non-ST elevated myocardial infarction) ([place]) 09/26/2019    Past Medical History:      Past Medical History:  Diagnosis Date  . CAD (coronary artery disease)    a. 09/2019 NSTEMI/Cath: LM nl, LAD 90p, 24m diffuse mid-dist dzs, LCX 935mV groove/OM2, RI 90p, RCA dominant, nl, RPDA 9085mF 25%, glob HK.  . HMarland KitchenrEF (heart failure with reduced ejection fraction) [place]  a. 09/2019 LV gram: EF 25%, global HK.  . PMarland KitchenD (peptic ulcer disease)   . Seasonal allergies    Past Surgical History:       Past Surgical History:  Procedure Laterality Date  . CHOLECYSTECTOMY    . CORONARY STENT INTERVENTION N/A 09/30/2019   Procedure: CORONARY STENT INTERVENTION;  Surgeon: JorMartiniqueeter M, MD;  Location: [facility];  Service: Cardiovascular;  Laterality: N/A;  . ESOPHAGOGASTRODUODENOSCOPY (EGD) WITH PROPOFOL N/A 10/08/2019   Procedure: ESOPHAGOGASTRODUODENOSCOPY (EGD) WITH PROPOFOL;  Surgeon: ArmYetta FlockD;  [facility];  Laterality: N/A;  .  HEMOSTASIS CLIP PLACEMENT  10/08/2019   Procedure: HEMOSTASIS CLIP PLACEMENT;  Surgeon: ArmYetta FlockD;  [facility];;  . HEMOSTASIS CONTROL  10/08/2019   Procedure: HEMOSTASIS CONTROL;  Surgeon: ArmYetta FlockD;  [facility];;  . LEFT HEART CATH AND CORONARY ANGIOGRAPHY N/A 09/30/2019   Procedure: LEFT HEART CATH AND CORONARY ANGIOGRAPHY;  Surgeon: JorMartiniqueeter M, MD;  Location: [facility];  Service: Cardiovascular;  Laterality: N/A;  . LEFT HEART CATH AND CORS/GRAFTS ANGIOGRAPHY N/A 09/27/2019   Procedure: LEFT HEART CATH AND CORS/GRAFTS ANGIOGRAPHY;  Surgeon: GolMinna MerrittsD;  [facility];  Service: Cardiovascular;  Laterality: N/A;    Assessment & Plan Clinical Impression: DonKaliah Haddaway a 68 3ar old right-handed female with unremarkable past medical history. History taken from chart review due to distraction. Patient was independent prior to admission 1 level home. [place] the [place] friend. Family planning assistance as needed on discharge. She presented on 09/27/2019 with intermittent nausea/vomitting x1 week as well as epigastric substernal pain. Admission chemistries potassium 3.0, troponin high-sensitivity 674, SARS coronavirus negative. EKG question old septal infarct with lateral ST-T changes CT abdomen pelvis negative for acute intra-abdominal or pelvic pathology. She was placed on IV heparin. Cardiac catheterization revealed severe multivessel CAD with LV dysfunction, ejection fraction of 25%. Patient was stented. Acute systolic heart failure due to an ICM and follow-up management per cardiology services. Patient did develop right groin hematoma after catheterization  and currently maintained on Lovenox as well as low-dose aspirin. Neurology consulted on 09/30/19 for lethargy, left facial droop, left upper extremity weakness.  Cranial CT showed right basal ganglia infarction as well as remote appearing right pontine infarction. CT angiogram of the neck moderate stenosis distal right M1 segment. Moderate to severe stenosis inferior branch of right middle cerebral artery. No large vessel occlusion. Follow-up CT of the head 10/01/2019 showed acute infarct right parietal lobe age-indeterminate infarct right caudate. EEG negative for seizures. Plavix was added to patient's prophylaxis for CVA. Gastroenterology services consulted 10/08/2019 for melena BUN markedly elevated 49-50, hemoglobin dipping to 8.7 down to 7.3, WBC 17,700. EGD upper endoscopy completed 10/08/2019 per Dr. Havery Moros showed actively bleeding duodenal ulcer with visible vessel treated with epi and hemostasis clips x5. Latest hemoglobin stable 8.4 on 4/12 and renal function improved BUN 9 creatinine 0.81. GI had recommended PPI twice daily and outpatient follow-up. Patient's aspirin is been discontinued and currently maintained on Plavix. Hospital course further complicated by UTI, urine culture greater 100,000 Aerococcus viridans and patient completed 5-day course of amoxicillin. Ophthalmology consulted 10/12/2019 Dr. Dennison Mascot for keratoconjunctivitis who recommended erythromycin ophthalmic 4 times daily x7 days with warm compresses. Patient is currently on a dysphagia #1 nectar thick liquid diet. Therapy evaluations completed and patient was admitted for a comprehensive rehab program. Please see preadmission assessment from earlier today as well.   Patient transferred to CIR on 10/16/2019 .    Pt presents with decreased activity tolerance, decreased functional mobility, decreased balance, decreased vision, left inattention to left, decreased awareness, decreased problem solving, decreased safety awareness and decreased memory Limiting pt's independence with leisure/community pursuits.  Met with pt and son-in-law at bedside today to discuss TR services,  leisure interests and activity modifications.    Pt requesting water upon arrival.  Pt performed oral care per protocol and consumed ~water and later ice chips with min cues for small amounts.  Pt with 1 cough while drinking water and instucted to perform throat clear after cough.  No further signs of aspiration noted.    Plan Min 1 TR session >20 minutes per week during LOS  Recommendations for other services: Neuropsych  Discharge Criteria: Patient will be discharged from TR if patient refuses treatment 3 consecutive times without medical reason.  If treatment goals not met, if there is a change in medical status, if patient makes no progress towards goals or if patient is discharged from hospital.  The above assessment, treatment plan, treatment alternatives and goals were discussed and mutually agreed upon: by patient  [place] 10/24/2019, 12:40 PM

## 2019-10-25 ENCOUNTER — Inpatient Hospital Stay (HOSPITAL_COMMUNITY): Payer: Medicare Other

## 2019-10-25 ENCOUNTER — Inpatient Hospital Stay (HOSPITAL_COMMUNITY): Payer: Medicare Other | Admitting: Speech Pathology

## 2019-10-25 ENCOUNTER — Inpatient Hospital Stay (HOSPITAL_COMMUNITY): Payer: Medicare Other | Admitting: Physical Therapy

## 2019-10-25 LAB — BASIC METABOLIC PANEL
Anion gap: 11 (ref 5–15)
BUN: 16 mg/dL (ref 8–23)
CO2: 24 mmol/L (ref 22–32)
Calcium: 9.8 mg/dL (ref 8.9–10.3)
Chloride: 105 mmol/L (ref 98–111)
Creatinine, Ser: 0.98 mg/dL (ref 0.44–1.00)
GFR calc Af Amer: >60 mL/min (ref >60)
GFR calc non Af Amer: 60 mL/min — ABNORMAL LOW (ref >60)
Glucose, Bld: 103 mg/dL — ABNORMAL HIGH (ref 70–99)
Potassium: 3.7 mmol/L (ref 3.5–5.1)
Sodium: 140 mmol/L (ref 135–145)

## 2019-10-25 LAB — GLUCOSE, CAPILLARY
Glucose-Capillary: 105 mg/dL — ABNORMAL HIGH (ref 70–99)
Glucose-Capillary: 175 mg/dL — ABNORMAL HIGH (ref 70–99)
Glucose-Capillary: 92 mg/dL (ref 70–99)
Glucose-Capillary: 134 mg/dL — ABNORMAL HIGH (ref 70–99)

## 2019-10-25 LAB — MAGNESIUM: Magnesium: 1.4 mg/dL — ABNORMAL LOW (ref 1.7–2.4)

## 2019-10-25 MED ORDER — MAGNESIUM GLUCONATE 500 MG PO TABS
500.0000 mg | ORAL_TABLET | Freq: Every day | ORAL | Status: DC
  Administered 2019-10-25 – 2019-11-01 (×7): 500 mg via ORAL
  Filled 2019-10-25 (×8): qty 1

## 2019-10-25 MED ORDER — MAGNESIUM OXIDE 400 (241.3 MG) MG PO TABS
400.0000 mg | ORAL_TABLET | Freq: Every day | ORAL | Status: DC
  Administered 2019-10-25: 400 mg via ORAL
  Filled 2019-10-25: qty 1

## 2019-10-25 MED ORDER — POTASSIUM CHLORIDE CRYS ER 20 MEQ PO TBCR
40.0000 meq | EXTENDED_RELEASE_TABLET | Freq: Once | ORAL | Status: AC
  Administered 2019-10-25: 09:00:00 40 meq via ORAL
  Filled 2019-10-25: qty 2

## 2019-10-25 MED ORDER — MAGNESIUM SULFATE 4 GM/100ML IV SOLN
4.0000 g | Freq: Once | INTRAVENOUS | Status: DC
  Filled 2019-10-25: qty 100

## 2019-10-25 NOTE — Progress Notes (Signed)
In/Out cath performed. Pt did not tolerate the procedure well, c/o 10/10 pain. After the procedure was completed, pt stated that she felt the urge to void more.   5 mins after the in/out cath, pt called nursing staff into the room stating, " I think I just used the bathroom". Pts brief was clean and dry.

## 2019-10-25 NOTE — Progress Notes (Signed)
[facility] PHYSICAL MEDICINE & REHABILITATION PROGRESS NOTE  Subjective/Complaints: Patient seen sitting up in bed this morning.  She states she did not sleep well overnight due to urinary frequency.  Discussed PVRs with nursing.  She was seen by heart failure yesterday, notes reviewed-labs ordered.  ROS: Denies CP, shortness of breath, nausea, vomiting, diarrhea.  Objective: Vital Signs: Blood pressure (!) 142/65, pulse 70, temperature 98.3 F (36.8 C), temperature source Oral, resp. rate 18, height 5\' 2"  (1.575 m), weight 67.6 kg, SpO2 100 %. No results found. No results for input(s): WBC, HGB, HCT, PLT in the last 72 hours. Recent Labs    10/25/19 0603  NA 140  K 3.7  CL 105  CO2 24  GLUCOSE 103*  BUN 16  CREATININE 0.98  CALCIUM 9.8    Physical Exam: BP (!) 142/65 (BP Location: Right Arm)   Pulse 70   Temp 98.3 F (36.8 C) (Oral)   Resp 18   Ht 5\' 2"  (1.575 m)   Wt 67.6 kg   SpO2 100%   BMI 27.26 kg/m  Constitutional: No distress . Vital signs reviewed. HENT: Normocephalic.  Atraumatic. Eyes: EOMI. No discharge. Cardiovascular: No JVD. Respiratory: Normal effort.  No stridor. GI: Non-distended. Skin: Warm and dry.  Intact. Psych: Confusion improving, slowed Musc: Left shoulder subluxation Neurological: Alert Left inattention Dysarthria, stable Facial weakness Motor: LUE: Shoulder abduction 3/5, distally 4/5  RLE: 4+/5 proximal to distal LLE: HF, KE 4+/5, ADF 4+/5  Assessment/Plan: 1. Functional deficits secondary to bilateral embolic infarcts.  Which require 3+ hours per day of interdisciplinary therapy in a comprehensive inpatient rehab setting.  Physiatrist is providing close team supervision and 24 hour management of active medical problems listed below.  Physiatrist and rehab team continue to assess barriers to discharge/monitor patient progress toward functional and medical goals  Care Tool:  Bathing    Body parts bathed by patient: Left arm,  Chest, Abdomen, Front perineal area, Right upper leg, Left upper leg, Face   Body parts bathed by helper: Right arm Body parts n/a: Face   Bathing assist Assist Level: Set up assist     Upper Body Dressing/Undressing Upper body dressing   What is the patient wearing?: Pull over shirt    Upper body assist Assist Level: Minimal Assistance - Patient > 75%    Lower Body Dressing/Undressing Lower body dressing      What is the patient wearing?: Pants, Underwear/pull up     Lower body assist Assist for lower body dressing: Moderate Assistance - Patient 50 - 74%     Toileting Toileting    Toileting assist Assist for toileting: Moderate Assistance - Patient 50 - 74%     Transfers Chair/bed transfer  Transfers assist     Chair/bed transfer assist level: Moderate Assistance - Patient 50 - 74%     Locomotion Ambulation   Ambulation assist      Assist level: 2 helpers Assistive device: Walker-rolling Max distance: 30   Walk 10 feet activity   Assist     Assist level: 2 helpers Assistive device: Walker-rolling   Walk 50 feet activity   Assist Walk 50 feet with 2 turns activity did not occur: Safety/medical concerns         Walk 150 feet activity   Assist Walk 150 feet activity did not occur: Safety/medical concerns         Walk 10 feet on uneven surface  activity   Assist Walk 10 feet on uneven surfaces  activity did not occur: Safety/medical concerns         Wheelchair     Assist Will patient use wheelchair at discharge?: (TBD)             Wheelchair 50 feet with 2 turns activity    Assist            Wheelchair 150 feet activity     Assist            Medical Problem List and Plan: 1.  Altered mental status left hemiparesis secondary to right MCA infarction along with a small left CR and splenium infarct embolic pattern likely due to transient cardiomyopathy with low EF after cardiac catheterization for  non-STEMI with stenting complicated by groin hematoma  Continue CIR 2.  Antithrombotics: -DVT/anticoagulation: SCDs             -antiplatelet therapy: Plavix 3. Pain Management: Tylenol as needed 4. Mood: Amantadine 100 mg twice daily, melatonin 3 mg nightly             -antipsychotic agents: N/A 5. Neuropsych: This patient is not capable of making decisions on her own behalf. 6. Skin/Wound Care: Routine skin checks 7. Fluids/Electrolytes/Nutrition: Routine in and outs.  8.  GI bleed/duodenal ulcer.  Status post EGD 10/08/2019 with epi and hemostasis clips x5.  PPI twice daily             See #9 9.  Acute blood loss anemia.    Hemoglobin 9.7 on 4/19  Continue to monitor 10.  AKI.  Resolved.  11.  Acute urinary tract infection.  Patient completed 5-day course amoxicillin 10/17/2019 12.  Post stroke dysphagia.  Dysphagia #2 nectar liquids.  Follow-up speech therapy             Advance diet as tolerated 13.  Keratoconjunctivitis.  Follow-up ophthalmology services.  Completed 7-day course of erythromycin drops on 4/17. 14.  Hypertension.     Increased Coreg to 6.25 mg BID with meals  Discussed with pharmacy, started on Losartan 12.5 on 4/22, with eventual plans for Entresto  Remains elevated on 4/23 15.  Hyperlipidemia.  Lipitor 16.  Prediabetes.  Hemoglobin A1c 5.9.  SSI  Labile on 4/23, monitor for trend CBG (last 3)  Recent Labs    10/24/19 1632 10/24/19 2113 10/25/19 0623  GLUCAP 132* 169* 105*              Monitor with increased mobility 17. Acute systolic CHF   Filed Weights   10/18/19 0301 10/24/19 0428 10/25/19 0320  Weight: 70.2 kg 69 kg 67.6 kg   Improving on 4/23 18. Hypomagnesemia  Mag 1.4 on 4/23  Supplemented IV on 4/19, again on 4/23  Oral supplementation initiated  19.  Hypokalemia  Potassium 3.7 on 4/23 after supplementation 20. Mod/Severe Hypoalbuminemia  Supplement initiated  LOS: 9 days A FACE TO FACE EVALUATION WAS PERFORMED  Brittney Kennedy Brittney Kennedy 10/25/2019, 10:34 AM

## 2019-10-25 NOTE — Progress Notes (Signed)
Physical Therapy Session Note  Patient Details  Name: Brittney Kennedy MRN: [id number] Date of Birth: [birth year]  Today's Date: 10/25/2019 PT Individual Time: 0905-1000 PT Individual Time Calculation (min): 55 min   Short Term Goals: Week 2:  PT Short Term Goal 1 (Week 2): Supine to sit w/cga anc veral cues using bed features PT Short Term Goal 2 (Week 2): wc to/from bed w/min assist and cues w/LRAD PT Short Term Goal 3 (Week 2): Gait 2ft w/ mod assist w/LRAD  Skilled Therapeutic Interventions/Progress Updates: Pt presents sitting in chair at sink, brushing teeth.  Pt agreeable to therapy, but requires use of toilet.  Pt wheeled into BR and required min A for sit to stand and SPT to toilet w/ mod A for clothing management.  Max A for clothing management once completed, but able to clean peri area.  Pt performed SPT w/c <> bed w/ min to mod A.  Pt required min A for sit <> sup and verbal cues.  Pt required bladder scan in supine.  Pt wheeled to gym for energy and time conservation.  Pt performed standing at table w/ left UE support and reaching for cones outside of BOS.  Pt also reaching outside of BOS while seated for ice chips.  Pt required rest breaks between trials d/t fatigue and c/o pain to R hip.  Rn notified and pain meds given during rx.  Pt returned to room and chair alarm on and needs in reach.  Family member present in room.     Therapy Documentation Precautions:  Precautions Precautions: Fall, Other (comment) Precaution Comments: aspiration precautions (HOB >30 degrees), significant L inattention/neglect, signifcant L hemiparesis (UE>LE with L shoulder subluxation) Restrictions Weight Bearing Restrictions: No General:   Vital Signs:  Pain:8/10 right hip, meds given during rx. Pain Assessment Pain Scale: 0-10 Pain Score: 0-No pain Mobility:   Locomotion :    Trunk/Postural Assessment :    Balance:   Exercises:   Other Treatments:      Therapy/Group: Individual  Therapy  Lucio Edward 10/25/2019, 11:04 AM

## 2019-10-25 NOTE — Progress Notes (Signed)
Speech Language Pathology Daily Session Note  Patient Details  Name: Brittney Kennedy MRN: [id number] Date of Birth: 06/29/1951  Today's Date: 10/25/2019 SLP Individual Time: 1435-1500 SLP Individual Time Calculation (min): 25 min  Short Term Goals: Week 2: SLP Short Term Goal 1 (Week 2): Pt will sustain attention to basic familiar task for ~ 5 minutes with Min A cues. SLP Short Term Goal 2 (Week 2): With Mod A cues, pt will utilize speech intelligibilty strategies to increase intelligibility to ~ 85% at sentence level. SLP Short Term Goal 3 (Week 2): Pt will consume least restrictive diet with efficient mastication and oral clearance with Supervision A cues for swallow strategies. SLP Short Term Goal 4 (Week 2): Pt will demonstrate readiness for repeat MBSS by consuming trials of thin via water protocol with minimal overt s/sx aspiration over 2 more consecutive sessions.  Skilled Therapeutic Interventions:  Pt was seen for skilled ST targeting dysphagia goals.  SLP facilitated the session with a functional snack of dys 2 textures to assess toleration of recent diet advancement.  Pt consumed minced solids with supervision cues to clear residue from the oral cavity post swallow.  Pt had been drinking a boost breeze supplement upon therapist's arrival.  Pt was unclear on whether drink had been thickened or not and there was not enough liquid left in the cup for therapist to determine whether it had been thickened.  As a result, SLP reviewed and reinforced team's recommendations that all liquids be thickened to nectar thick consistency.  SLP briefly spoke with pt's son in law about how to thicken liquids.  Pt reports that she does not like thickened liquids and SLP encouraged pt to continue trials of ice chips and/or thin water in between meals per the water protocol.  Pt was left in wheelchair with son in law at bedside.  Continue per current plan of care.    Pain Pain Assessment Pain Scale: 0-10 Pain  Score: 0-No pain  Therapy/Group: Individual Therapy  Davinder Haff, Melanee Spry 10/25/2019, 3:29 PM

## 2019-10-25 NOTE — Progress Notes (Signed)
Occupational Therapy Session Note  Patient Details  Name: Brittney Kennedy MRN: [id number] Date of Birth: 1950-08-28  Today's Date: 10/25/2019 OT Individual Time: 0100-0145 OT Individual Time Calculation (min): 45 min    Short Term Goals: Week 2:  OT Short Term Goal 1 (Week 2): Pt will demonstrate improved attention and ability to follow directions to don shirt with verbal cues and set up. OT Short Term Goal 2 (Week 2): Pt will don pants over L foot with steadying A. OT Short Term Goal 3 (Week 2): Pt will use L hand as a stabilizing A when opening/closing containers. OT Short Term Goal 4 (Week 2): Pt will be able to lift L shoulder to 45 degrees abduction to be able to wash under arm pit easily.  Skilled Therapeutic Interventions/Progress Updates:  Pt received seated in w/c with son-in- law present finishing up lunch agreeable to OT intervention. Issued pt mug with handle to facilitate bilateral integration and functional cylindrical grasp for self- feeding.  Pt required cues to locate ice cream positioned on L side of tray and to use LUE as stabilizer during self feeding. Pt request to shower with remainder of session focused on BADL retraining. Overall, pt requires MOD A for stand pivot transfer from w/c<>shower chair needing tactile cues to orient to shower chair when transferring to the left. Pt completed bathing with MOD A needing hand over hand assist to wash RUE with LUE and attend to LUE and LLE during bathing. Pt required MOD A for UB dressing with pt able to state needing to thread LUE first but actually attempting to start with RUE. MAX A for LB dressing d/t time mgmt. Pt required MOD A for sit<>stand with RW to pull pants up to waist line. Pt able to help with clothing mgmt on R side but ultimately needed MOD A overall to pull pants up. Pt left up in w/c with all needs within reach and safety belt activated.   Therapy Documentation Precautions:  Precautions Precautions: Fall, Other  (comment) Precaution Comments: aspiration precautions (HOB >30 degrees), significant L inattention/neglect, signifcant L hemiparesis (UE>LE with L shoulder subluxation) Restrictions Weight Bearing Restrictions: No General:   Vital Signs: Therapy Vitals Temp: 98.2 F (36.8 C) Pulse Rate: 76 Resp: 18 BP: (!) 130/59 Patient Position (if appropriate): Sitting Oxygen Therapy SpO2: 100 % O2 Device: Room Air Pain: Pt reports no pain during session.   Therapy/Group: Individual Therapy  Angelina Pih 10/25/2019, 3:00 PM

## 2019-10-25 NOTE — Progress Notes (Signed)
Occupational Therapy Session Note  Patient Details  Name: Brittney Kennedy MRN: [id number] Date of Birth: [birth year]  Today's Date: 10/25/2019 OT Individual Time: 1520-1550 OT Individual Time Calculation (min): 30 min    Short Term Goals: Week 1:  OT Short Term Goal 1 (Week 1): Pt will be able to use L hand as a stabilizing A with UB bathing. OT Short Term Goal 1 - Progress (Week 1): Progressing toward goal OT Short Term Goal 2 (Week 1): Pt will be able to find items on her food tray on L side with min cues. OT Short Term Goal 2 - Progress (Week 1): Met OT Short Term Goal 3 (Week 1): Pt will complete squat pivot to toilet with mod A. OT Short Term Goal 3 - Progress (Week 1): Met OT Short Term Goal 4 (Week 1): Pt will don a shirt with mod A. OT Short Term Goal 4 - Progress (Week 1): Met OT Short Term Goal 5 (Week 1): Pt will rise to stand with mod A. OT Short Term Goal 5 - Progress (Week 1): Met  Skilled Therapeutic Interventions/Progress Updates:    1:1. Pt receive in bed agreeable to OT with pain in hip. Heat pack provided and RN alerted to deliver tylenol. Pt requesting ice chips and agreeable to brushing teeth for ice chip protocol. Pt completes oral care with min VC for location of toothbrush just L of midline. Pt consumes 7 spoonfuls ice chips with no s/s of aspiration by TSP. Pt completes 3 sit to stands table with MOD A to power up and min multimodal cues at knee for extension when weight shifting L. Exited session after MOD A stand picot transfer back to bed, exit alarm on and call light in reach  Therapy Documentation Precautions:  Precautions Precautions: Fall, Other (comment) Precaution Comments: aspiration precautions (HOB >30 degrees), significant L inattention/neglect, signifcant L hemiparesis (UE>LE with L shoulder subluxation) Restrictions Weight Bearing Restrictions: No General:   Vital Signs: Therapy Vitals Temp: 98.2 F (36.8 C) Pulse Rate: 76 Resp: 18 BP: (!)  130/59 Patient Position (if appropriate): Sitting Oxygen Therapy SpO2: 100 % O2 Device: Room Air Pain: Pain Assessment Pain Scale: 0-10 Pain Score: 0-No pain ADL: ADL Grooming: Setup Where Assessed-Grooming: Sitting at sink Upper Body Bathing: Minimal assistance Where Assessed-Upper Body Bathing: Shower Lower Body Bathing: Moderate assistance Where Assessed-Lower Body Bathing: Shower Upper Body Dressing: Minimal assistance Where Assessed-Upper Body Dressing: Wheelchair Lower Body Dressing: Moderate assistance Where Assessed-Lower Body Dressing: Wheelchair Toileting: Moderate assistance Where Assessed-Toileting: Glass blower/designer: Moderate assistance Toilet Transfer Method: Stand pivot Toilet Transfer Equipment: Grab bars, Raised toilet seat Social research officer, government: Moderate assistance Social research officer, government Method: Public house manager with back, Landscape architect   Exercises:   Other Treatments:     Therapy/Group: Individual Therapy  Tonny Branch 10/25/2019, 4:00 PM

## 2019-10-25 NOTE — Progress Notes (Signed)
Verbal order given from Dr. Larna Daughters for In/Out cath due to PVR.

## 2019-10-25 NOTE — Progress Notes (Signed)
Occupational Therapy Session Note  Patient Details  Name: Brittney Kennedy MRN: [id number] Date of Birth: 1951/06/10  Today's Date: 10/25/2019 OT Individual Time: 1030-1100 OT Individual Time Calculation (min): 30 min    Short Term Goals: Week 2:  OT Short Term Goal 1 (Week 2): Pt will demonstrate improved attention and ability to follow directions to don shirt with verbal cues and set up. OT Short Term Goal 2 (Week 2): Pt will don pants over L foot with steadying A. OT Short Term Goal 3 (Week 2): Pt will use L hand as a stabilizing A when opening/closing containers. OT Short Term Goal 4 (Week 2): Pt will be able to lift L shoulder to 45 degrees abduction to be able to wash under arm pit easily.  Skilled Therapeutic Interventions/Progress Updates:  Pt received seated in w/c with son-in-law present reporting not sleeping well but agreeable to OT intervention. Session focus on seated LUE therex to increased AROM for further ADL participation. Pt completed scapular protraction/ retraction, horizontal elbow flexion/ extension and sh circumduction in gravity eliminated plane with friction reducing device. Pt completed x10 reps each.  Noted increased active finger flexion this session with pt able to complete composite finger flexion with wrist pronated from gravity eliminated plane. Pt also able to abduct/ adduct digits with wrist pronated. Pt left up in w/c with all needs within reach and belt alarm activated.   Therapy Documentation Precautions:  Precautions Precautions: Fall, Other (comment) Precaution Comments: aspiration precautions (HOB >30 degrees), significant L inattention/neglect, signifcant L hemiparesis (UE>LE with L shoulder subluxation) Restrictions Weight Bearing Restrictions: No General:   Vital Signs:  Pain: Pt reports unrated pain in L shoulder; decreased ROM during therex as pain mgmt strategy.   Therapy/Group: Individual Therapy  Angelina Pih 10/25/2019, 12:11 PM

## 2019-10-25 NOTE — Progress Notes (Signed)
Pt concerned about the frequent voiding. Pt requesting to void 2-3x an hour, each void < 50 ml. Pt states she did not have voiding issues prior to being admitted to the hospital, and would like the doctor to to be notified.

## 2019-10-26 ENCOUNTER — Inpatient Hospital Stay (HOSPITAL_COMMUNITY): Payer: Medicare Other

## 2019-10-26 ENCOUNTER — Inpatient Hospital Stay (HOSPITAL_COMMUNITY): Payer: Medicare Other | Admitting: Occupational Therapy

## 2019-10-26 DIAGNOSIS — R410 Disorientation, unspecified: Secondary | ICD-10-CM

## 2019-10-26 LAB — URINALYSIS, ROUTINE W REFLEX MICROSCOPIC
Bacteria, UA: NONE SEEN
Bilirubin Urine: NEGATIVE
Glucose, UA: NEGATIVE mg/dL
Hgb urine dipstick: NEGATIVE
Ketones, ur: NEGATIVE mg/dL
Leukocytes,Ua: NEGATIVE
Nitrite: NEGATIVE
Protein, ur: 100 mg/dL — AB
Specific Gravity, Urine: 1.014 (ref 1.005–1.030)
pH: 7 (ref 5.0–8.0)

## 2019-10-26 LAB — CBC WITH DIFFERENTIAL/PLATELET
Abs Immature Granulocytes: 0.03 10*3/uL (ref 0.00–0.07)
Basophils Absolute: 0.1 10*3/uL (ref 0.0–0.1)
Basophils Relative: 1 %
Eosinophils Absolute: 0.2 10*3/uL (ref 0.0–0.5)
Eosinophils Relative: 2 %
HCT: 34.3 % — ABNORMAL LOW (ref 36.0–46.0)
Hemoglobin: 11 g/dL — ABNORMAL LOW (ref 12.0–15.0)
Immature Granulocytes: 0 %
Lymphocytes Relative: 24 %
Lymphs Abs: 1.7 10*3/uL (ref 0.7–4.0)
MCH: 30.1 pg (ref 26.0–34.0)
MCHC: 32.1 g/dL (ref 30.0–36.0)
MCV: 93.7 fL (ref 80.0–100.0)
Monocytes Absolute: 0.6 10*3/uL (ref 0.1–1.0)
Monocytes Relative: 9 %
Neutro Abs: 4.5 10*3/uL (ref 1.7–7.7)
Neutrophils Relative %: 64 %
Platelets: 278 10*3/uL (ref 150–400)
RBC: 3.66 MIL/uL — ABNORMAL LOW (ref 3.87–5.11)
RDW: 15.6 % — ABNORMAL HIGH (ref 11.5–15.5)
WBC: 7 10*3/uL (ref 4.0–10.5)
nRBC: 0 % (ref 0.0–0.2)

## 2019-10-26 LAB — GLUCOSE, CAPILLARY
Glucose-Capillary: 110 mg/dL — ABNORMAL HIGH (ref 70–99)
Glucose-Capillary: 156 mg/dL — ABNORMAL HIGH (ref 70–99)
Glucose-Capillary: 156 mg/dL — ABNORMAL HIGH (ref 70–99)
Glucose-Capillary: 153 mg/dL — ABNORMAL HIGH (ref 70–99)

## 2019-10-26 MED ORDER — CEPHALEXIN 250 MG PO CAPS
250.0000 mg | ORAL_CAPSULE | Freq: Three times a day (TID) | ORAL | Status: AC
  Administered 2019-10-26 – 2019-10-31 (×16): 250 mg via ORAL
  Filled 2019-10-26 (×16): qty 1

## 2019-10-26 MED ORDER — HALOPERIDOL 0.5 MG PO TABS
0.5000 mg | ORAL_TABLET | Freq: Three times a day (TID) | ORAL | Status: DC | PRN
  Administered 2019-10-26: 23:00:00 0.5 mg via ORAL
  Filled 2019-10-26 (×2): qty 1

## 2019-10-26 MED ORDER — HALOPERIDOL LACTATE 5 MG/ML IJ SOLN
1.0000 mg | Freq: Three times a day (TID) | INTRAMUSCULAR | Status: DC | PRN
  Filled 2019-10-26: qty 0.2
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
## 2019-10-26 NOTE — Progress Notes (Signed)
Pt c/o congestion/cough due to allergies. Pt denies SOB. Pt Requesting allergy/cough medication. Pt also has not slept through the night, and appears to be restless and agitated. PRN melatonin not effective.

## 2019-10-26 NOTE — Progress Notes (Signed)
Pt c/o pain and discomfort with the peri area. Pt states the "urgency" is keeping her awake. Pt appears restless and uncomfortable.  Pt refusing PRN tylenol.

## 2019-10-26 NOTE — Progress Notes (Signed)
Occupational Therapy Session Note  Patient Details  Name: Brittney Kennedy MRN: [id number] Date of Birth: Jan 10, 1951  Today's Date: 10/26/2019 OT Individual Time: 1400-1500 OT Individual Time Calculation (min): 60 min (unattended estim 30 min 1500-1530)   Short Term Goals: Week 2:  OT Short Term Goal 1 (Week 2): Pt will demonstrate improved attention and ability to follow directions to don shirt with verbal cues and set up. OT Short Term Goal 2 (Week 2): Pt will don pants over L foot with steadying A. OT Short Term Goal 3 (Week 2): Pt will use L hand as a stabilizing A when opening/closing containers. OT Short Term Goal 4 (Week 2): Pt will be able to lift L shoulder to 45 degrees abduction to be able to wash under arm pit easily.  Skilled Therapeutic Interventions/Progress Updates:    Pt seen this session with her son in law present to  Focus on L side awareness, balance, LUE AROM.  Pt taken to gym: -Squat pivot transfers to both sides with min A -sit to stands holding dowel bar min A focusing on forward leaning -LUE a/arom with dowel bar reaching for a target to involve torso rotation - Bimanual holds on yoga block for isometric finger ext -grasp and release of thin yoga block  -B scapular active retraction   Perceptual/ cognitive task of color matching activity with max cues for spatial relations and L side scanning.    At end of session, pt transferred to bed and estim placed on L deltoid for 30 min of unattended cyclic estim to reduce subluxation.  Saebo Stim One 330 pulse width 35 Hz pulse rate On 8 sec/ off 8 sec Ramp up/ down 2 sec Symmetrical Biphasic wave form  Max intensity 155m at 500 Ohm load  Pt in bed with all needs met and alarm set.   Therapy Documentation Precautions:  Precautions Precautions: Fall, Other (comment) Precaution Comments: aspiration precautions (HOB >30 degrees), significant L inattention/neglect, signifcant L hemiparesis (UE>LE with L shoulder  subluxation) Restrictions Weight Bearing Restrictions: No    Vital Signs: Therapy Vitals Temp: 97.8 F (36.6 C) Pulse Rate: 71 Resp: 17 BP: (!) 151/68 Patient Position (if appropriate): Lying Oxygen Therapy SpO2: 99 % O2 Device: Room Air Pain: Pain Assessment Pain Score: 0-No pain   Therapy/Group: Individual Therapy  [place]4/24/2021, 4:52 PM

## 2019-10-27 ENCOUNTER — Inpatient Hospital Stay (HOSPITAL_COMMUNITY): Payer: Medicare Other | Admitting: Speech Pathology

## 2019-10-27 LAB — GLUCOSE, CAPILLARY
Glucose-Capillary: 133 mg/dL — ABNORMAL HIGH (ref 70–99)
Glucose-Capillary: 131 mg/dL — ABNORMAL HIGH (ref 70–99)
Glucose-Capillary: 141 mg/dL — ABNORMAL HIGH (ref 70–99)
Glucose-Capillary: 131 mg/dL — ABNORMAL HIGH (ref 70–99)

## 2019-10-27 LAB — URINE CULTURE: Culture: 20000 — AB

## 2019-10-27 NOTE — Progress Notes (Signed)
Speech Language Pathology Daily Session Note  Patient Details  Name: Brittney Kennedy MRN: [id number] Date of Birth: Nov 22, 1950  Today's Date: 10/27/2019 SLP Individual Time: 1346-1416 SLP Individual Time Calculation (min): 30 min  Short Term Goals: Week 2: SLP Short Term Goal 1 (Week 2): Pt will sustain attention to basic familiar task for ~ 5 minutes with Min A cues. SLP Short Term Goal 2 (Week 2): With Mod A cues, pt will utilize speech intelligibilty strategies to increase intelligibility to ~ 85% at sentence level. SLP Short Term Goal 3 (Week 2): Pt will consume least restrictive diet with efficient mastication and oral clearance with Supervision A cues for swallow strategies. SLP Short Term Goal 4 (Week 2): Pt will demonstrate readiness for repeat MBSS by consuming trials of thin via water protocol with minimal overt s/sx aspiration over 2 more consecutive sessions.  Skilled Therapeutic Interventions: Pt was seen for skilled ST targeting dysphagia, specifically upgraded trials of thin H2O per the water protocol. She had excellent recall of need for oral care prior to ice chips. Pt completed oral care with set up and Min A verbal cues for sequencing. She then accepted ~15 ice chips and ~3oz thin H2O via cup with 2 slightly delayed throat clears noted throughout intake. Recommend continue current diet. Pt left sitting in bed with alarm set and needs within reach. Continue per current plan of care.        Pain Pain Assessment Pain Scale: 0-10 Pain Score: 0-No pain  Therapy/Group: Individual Therapy  [name] 10/27/2019, 7:17 AM
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
## 2019-10-28 ENCOUNTER — Inpatient Hospital Stay (HOSPITAL_COMMUNITY): Payer: Medicare Other

## 2019-10-28 ENCOUNTER — Inpatient Hospital Stay (HOSPITAL_COMMUNITY): Payer: Medicare Other | Admitting: Occupational Therapy

## 2019-10-28 ENCOUNTER — Inpatient Hospital Stay (HOSPITAL_COMMUNITY): Payer: Medicare Other | Admitting: Speech Pathology

## 2019-10-28 ENCOUNTER — Inpatient Hospital Stay (HOSPITAL_COMMUNITY): Payer: Medicare Other | Admitting: Physical Therapy

## 2019-10-28 LAB — GLUCOSE, CAPILLARY
Glucose-Capillary: 116 mg/dL — ABNORMAL HIGH (ref 70–99)
Glucose-Capillary: 148 mg/dL — ABNORMAL HIGH (ref 70–99)
Glucose-Capillary: 136 mg/dL — ABNORMAL HIGH (ref 70–99)
Glucose-Capillary: 169 mg/dL — ABNORMAL HIGH (ref 70–99)

## 2019-10-28 MED ORDER — LOSARTAN POTASSIUM 25 MG PO TABS
12.5000 mg | ORAL_TABLET | Freq: Once | ORAL | Status: AC
  Administered 2019-10-28: 12:00:00 12.5 mg via ORAL
  Filled 2019-10-28: qty 0.5

## 2019-10-28 MED ORDER — LOSARTAN POTASSIUM 50 MG PO TABS
25.0000 mg | ORAL_TABLET | Freq: Every day | ORAL | Status: DC
  Administered 2019-10-29 – 2019-10-30 (×2): 25 mg via ORAL
  Filled 2019-10-28 (×4): qty 1

## 2019-10-28 NOTE — Progress Notes (Signed)
Occupational Therapy Session Note  Patient Details  Name: Brittney Kennedy MRN: [id number] Date of Birth: [birth year]  Today's Date: 10/28/2019 OT Individual Time: 2025-4270 OT Individual Time Calculation (min): 60 min (1115-1145 unattended estim)   Short Term Goals: Week 2:  OT Short Term Goal 1 (Week 2): Pt will demonstrate improved attention and ability to follow directions to don shirt with verbal cues and set up. OT Short Term Goal 2 (Week 2): Pt will don pants over L foot with steadying A. OT Short Term Goal 3 (Week 2): Pt will use L hand as a stabilizing A when opening/closing containers. OT Short Term Goal 4 (Week 2): Pt will be able to lift L shoulder to 45 degrees abduction to be able to wash under arm pit easily.  Skilled Therapeutic Interventions/Progress Updates:    Pt received in w/c c/o knee pain from walking. Pt agreeable to trying a hot shower for relief.  Pt anxious for ice chips.  Brushed teeth with set up/S and then ate about 3 oz of ice chips.  Completed stand pivot to shower seat with R hand on grab bar with only min A and cues to advance L foot forward and to the side but overall good motor control with LLE therefore she was able to stand pivot with only min A.    In shower, hand over hand A to use L hand to wash R arm.  A to wash feet.  Stand pivot back to wc to dress.  Continues to need mod verbal cues with hemidressing techniques but is able to don shirt and pants with min A, even standing with min A to pull pants over hips.  Can use L hand with hand over hand A.   Pt set up with tray table at her side for LUE gravity eliminated elbow flex/ext aarom exercises and active finger flexion with grasping towel.  She continues to have decreased finger extension, so applied estim to her forearm to stimulate finger extension.  Observed pt with estim for 10 minutes to ensure she could tolerate the stimulation and follow the grasp/release on the foam block instructions. Explained  to pt to continue working on it with the stimulation and I would return later to remove it.  Told pt to contact RN if she wanted it to stop earlier.  Per pt, "my fingers were getting over stimulated and I did not like the feeling". RN verified that she removed it.  If estim is resumed, pt will need to be supervised as she is sensitive to the stimulation.    Sa[place]o Stim One 330 pulse width 35 Hz pulse rate On 8 sec/ off 8 sec Ramp up/ down 2 sec Symmetrical Biphasic wave form  Max intensity 145m at 500 Ohm load   Pt in wc with belt alarm on and all needs met.  Therapy Documentation Precautions:  Precautions Precautions: Fall, Other (comment) Precaution Comments: aspiration precautions (HOB >30 degrees), significant L inattention/neglect, signifcant L hemiparesis (UE>LE with L shoulder subluxation) Restrictions Weight Bearing Restrictions: No   Pain: Pain Assessment Pain Scale: 0-10 Pain Score: 5  Pain Type: Acute pain Pain Location: Knee Pain Orientation: Right;Left Pain Descriptors / Indicators: Aching Pain Onset: Gradual Pain Intervention(s): Shower ADL: ADL Grooming: Supervision/safety Where Assessed-Grooming: Sitting at sink Upper Body Bathing: Minimal assistance Where Assessed-Upper Body Bathing: Shower Lower Body Bathing: Minimal assistance Where Assessed-Lower Body Bathing: Shower Upper Body Dressing: Minimal assistance Where Assessed-Upper Body Dressing: Wheelchair Lower Body Dressing: Moderate assistance Where Assessed-Lower Body  Dressing: Wheelchair Toileting: Moderate assistance Where Assessed-Toileting: Glass blower/designer: Moderate assistance Toilet Transfer Method: Stand pivot Toilet Transfer Equipment: Grab bars, Raised toilet seat Social research officer, government: Minimal assistance Social research officer, government Method: Radiographer, therapeutic: Shower seat with back, Grab bars   Therapy/Group: Individual Therapy  Breann Losano 10/28/2019,  11:29 AM

## 2019-10-28 NOTE — Progress Notes (Addendum)
Physical Therapy Session Note  Patient Details  Name: Brittney Kennedy MRN: [id number] Date of Birth: [birth year]  Today's Date: 10/28/2019 PT Individual Time: 1275-1700 and 1416-1450 PT Individual Time Calculation (min): 75 min and 34 min  Short Term Goals: Week 2:  PT Short Term Goal 1 (Week 2): Supine to sit w/cga anc veral cues using bed features PT Short Term Goal 2 (Week 2): wc to/from bed w/min assist and cues w/LRAD PT Short Term Goal 3 (Week 2): Gait 45ft w/ mod assist w/LRAD  Skilled Therapeutic Interventions/Progress Updates:     1st Session: Pt received supine in bed and agreeable to therapy. Supine>sit with minA and use of bed features. Sit to stand with minA HHA and PT provides minA to don pants in standing. Pt takes seated rest break and then performs stand step transfer to [facility] with HHA modA and slight L knee buckling. Pt stands at sink to brush teeth with minA and cues to attend to L side of body and tactile facilitation of LLE extension.  WC transport to therapy gym for energy conservation. Pt performs gait training with RW and +2 WC follow. 35', 35', and 75'. PT provides minA/modA tactile cuing at L knee to promote full extension, manual facilitation of RW propulsion to encourage functional gait pattern and speed, increasing stride width due to pt tendency for scissoring with LLE, and cues to maintain upright gaze. With fatigue, pt looks down and to the right. Extended seated rest break between each bout of ambulation. Pt primarily requires minA but has occasional LOB to the L and buckling in BLEs, requiring modA for safety.  Pt performs minisquats in // bars with tactile cuing for correct form, including upright posture, and preventing knees from adducting or translating anteriorly over ankles. x10 reps.  WC transport back to room. Pt left seated in WC with alarm intact and all needs with reach.   2nd Session: Pt received seated in WC and agreeable to therapy. Verbalizes feeling  very fatigued. WC transport to therapy gym for energy conservation.  Session focused on standing balance with UE support. Pt performs stand step transfer to mat table with modA and no AD. Repeated bout of standing with focus on body mechanics to facilitate L weight shift while performing sit to stand and standing balance, reaching for clothespins on R and hanging them overhead on basketball goal to pt's L. Pt has frequent LOBs, especially to the L and posteriorly and requires modA for safety. Activity transitions to standing and reaching for therapist's hand in various locations. Dynamic balance consisently requiring modA but pt able to maintain static standing balance with CGA up to 1 minute. Stand step transfer back to [facility] and from [facility] to bed with minA/modA. Sit>supine with modA. Pt left supine in bed with alarm intact and all needs within reach.  Therapy Documentation Precautions:  Precautions Precautions: Fall, Other (comment) Precaution Comments: aspiration precautions (HOB >30 degrees), significant L inattention/neglect, signifcant L hemiparesis (UE>LE with L shoulder subluxation) Restrictions Weight Bearing Restrictions: No   Pain: Pain Assessment Pain Scale: 0-10 Pain Score: 5  Pain Type: Acute pain Pain Location: Knee Pain Orientation: Right;Left Pain Descriptors / Indicators: Aching;Tightness Pain Onset: Gradual Pain Intervention(s): RN made aware;Elevated extremity;Rest    Therapy/Group: Individual Therapy  Beau Fanny, PT, DPT 10/28/2019, 3:04 PM

## 2019-10-28 NOTE — Progress Notes (Signed)
[place] PHYSICAL MEDICINE & REHABILITATION PROGRESS NOTE  Subjective/Complaints: Patient seen laying in bed this morning.  She states she slept well overnight, but wants to sleep more.  Over the weekend she was noted to be agitated aggressive with staff.  This appeared to improve over time.  Imaging as well as laboratory work ordered.  ROS: Denies CP, shortness of breath, nausea, vomiting, diarrhea.  Objective: Vital Signs: Blood pressure (!) 179/78, pulse 79, temperature 98.4 F (36.9 C), resp. rate 16, height 5\' 2"  (1.575 m), weight 64.5 kg, SpO2 100 %. CT HEAD WO CONTRAST  Result Date: 10/26/2019 CLINICAL DATA:  Stroke.  Worsening confusion.  Follow-up. EXAM: CT HEAD WITHOUT CONTRAST TECHNIQUE: Contiguous axial images were obtained from the base of the skull through the vertex without intravenous contrast. COMPARISON:  10/09/2019 FINDINGS: Brain: Evolutionary changes of infarction in the right posterior temporal lobe and parietal lobe. Hemorrhage and/or calcification within the gyri of the region consistent with laminar necrosis. No evidence of frank focal hematoma or mass effect. Elsewhere, the brain shows chronic small-vessel ischemic changes of the white matter. Other small areas of acute infarction demonstrated on 10/09/2019 are not specifically visible. No hydrocephalus. No extra-axial collection. Vascular: There is atherosclerotic calcification of the major vessels at the base of the brain. 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 This could be a combination of blood products and calcification. No focal hematoma. No mass effect. No newly seen regional insult. Electronically Signed   By: 12/09/2019 M.D.   On: 10/26/2019 15:49   Recent Labs    10/26/19 1055  WBC 7.0  HGB 11.0*  HCT 34.3*  PLT  278   No results for input(s): NA, K, CL, CO2, GLUCOSE, BUN, CREATININE, CALCIUM in the last 72 hours.  Physical Exam: BP (!) 179/78 (BP Location: Right Arm)   Pulse 79   Temp 98.4 F (36.9 C)   Resp 16   Ht 5\' 2"  (1.575 m)   Wt 64.5 kg   SpO2 100%   BMI 26.01 kg/m  Constitutional: No distress . Vital signs reviewed. HENT: Normocephalic.  Atraumatic. Eyes: EOMI. No discharge. Cardiovascular: No JVD. Respiratory: Normal effort.  No stridor. GI: Non-distended. Skin: Warm and dry.  Intact. Psych: Slightly irritable Musc: No edema in extremities.  No tenderness in extremities. Neuro: Alert Left inattention Dysarthria Facial weakness Motor:  RUE/RLE: 5/5 proximal distal LUE: Shoulder abduction 3/5, distally 4/5 LLE: 4+/5 proximal to distal  Assessment/Plan: 1. Functional deficits secondary to bilateral embolic infarcts.  Which require 3+ hours per day of interdisciplinary therapy in a comprehensive inpatient rehab setting.  Physiatrist is providing close team supervision and 24 hour management of active medical problems listed below.  Physiatrist and rehab team continue to assess barriers to discharge/monitor patient progress toward functional and medical goals  Care Tool:  Bathing    Body parts bathed by patient: Chest, Abdomen, Front perineal area, Face   Body parts bathed by helper: Right upper leg, Left upper leg Body parts n/a: Right arm, Left arm   Bathing assist Assist Level: Moderate Assistance - Patient 50 - 74%     Upper Body Dressing/Undressing Upper body dressing   What is the patient wearing?: Pull over shirt    Upper body assist Assist Level: Moderate Assistance - Patient 50 - 74%    Lower Body Dressing/Undressing Lower  body dressing      What is the patient wearing?: Pants, Underwear/pull up     Lower body assist Assist for lower body dressing: Maximal Assistance - Patient 25 - 49%     Toileting Toileting    Toileting assist Assist for  toileting: Moderate Assistance - Patient 50 - 74%     Transfers Chair/bed transfer  Transfers assist     Chair/bed transfer assist level: Moderate Assistance - Patient 50 - 74%     Locomotion Ambulation   Ambulation assist      Assist level: 2 helpers Assistive device: Walker-rolling Max distance: 30   Walk 10 feet activity   Assist     Assist level: 2 helpers Assistive device: Walker-rolling   Walk 50 feet activity   Assist Walk 50 feet with 2 turns activity did not occur: Safety/medical concerns         Walk 150 feet activity   Assist Walk 150 feet activity did not occur: Safety/medical concerns         Walk 10 feet on uneven surface  activity   Assist Walk 10 feet on uneven surfaces activity did not occur: Safety/medical concerns         Wheelchair     Assist Will patient use wheelchair at discharge?: (TBD)             Wheelchair 50 feet with 2 turns activity    Assist            Wheelchair 150 feet activity     Assist            Medical Problem List and Plan: 1.  Altered mental status left hemiparesis secondary to right MCA infarction along with a small left CR and splenium infarct embolic pattern likely due to transient cardiomyopathy with low EF after cardiac catheterization for non-STEMI with stenting complicated by groin hematoma  Continue CIR  Repeat CT unremarkable for changes 2.  Antithrombotics: -DVT/anticoagulation: SCDs             -antiplatelet therapy: Plavix 3. Pain Management: Tylenol as needed 4. Mood: Amantadine 100 mg twice daily, DC'd secondary to hallucinations  Melatonin 3 mg nightly             -antipsychotic agents: N/A 5. Neuropsych: This patient is not capable of making decisions on her own behalf. 6. Skin/Wound Care: Routine skin checks 7. Fluids/Electrolytes/Nutrition: Routine in and outs.  8.  GI bleed/duodenal ulcer.  Status post EGD 10/08/2019 with epi and hemostasis clips  x5.  PPI twice daily             See #9 9.  Acute blood loss anemia.    Hemoglobin 11.0 on 4/24  Continue to monitor 10.  AKI.  Resolved.  11.  Acute urinary tract infection.  Patient completed 5-day course amoxicillin 10/17/2019 12.  Post stroke dysphagia.  Dysphagia #2 nectar liquids.  Follow-up speech therapy             Advance diet as tolerated 13.  Keratoconjunctivitis.  Follow-up ophthalmology services.  Completed 7-day course of erythromycin drops on 4/17. 14.  Hypertension.     Increased Coreg to 6.25 mg BID with meals  Discussed with pharmacy, started on Losartan 12.5 on 4/22, with eventual plans for Entresto  Remains elevated and labile on 4/26 15.  Hyperlipidemia.  Lipitor 16.  Prediabetes.  Hemoglobin A1c 5.9.  SSI   CBG (last 3)  Recent Labs    10/27/19 1714 10/27/19 2103  10/28/19 0624  GLUCAP 141* 131* 116*   Relatively controlled on 4/26 17. Acute systolic CHF   Filed Weights   10/26/19 0432 10/27/19 0500 10/28/19 0313  Weight: 65.8 kg 67.5 kg 64.5 kg   Improving on 4/26 18. Hypomagnesemia  Mag 1.4 on 4/23  Supplemented IV on 4/19, again on 4/23  Oral supplementation initiated  19.  Hypokalemia  Potassium 3.7 on 4/23 after supplementation 20. Mod/Severe Hypoalbuminemia  Supplement initiated  LOS: 12 days A FACE TO FACE EVALUATION WAS PERFORMED  Charlot Gouin Lorie Phenix 10/28/2019, 9:48 AM

## 2019-10-28 NOTE — Progress Notes (Signed)
Speech Language Pathology Daily Session Note  Patient Details  Name: Brittney Kennedy MRN: [id number] Date of Birth: Jun 14, 1951  Today's Date: 10/28/2019 SLP Individual Time: 1300-1345 SLP Individual Time Calculation (min): 45 min  Short Term Goals: Week 2: SLP Short Term Goal 1 (Week 2): Pt will sustain attention to basic familiar task for ~ 5 minutes with Min A cues. SLP Short Term Goal 2 (Week 2): With Mod A cues, pt will utilize speech intelligibilty strategies to increase intelligibility to ~ 85% at sentence level. SLP Short Term Goal 3 (Week 2): Pt will consume least restrictive diet with efficient mastication and oral clearance with Supervision A cues for swallow strategies. SLP Short Term Goal 4 (Week 2): Pt will demonstrate readiness for repeat MBSS by consuming trials of thin via water protocol with minimal overt s/sx aspiration over 2 more consecutive sessions.  Skilled Therapeutic Interventions: Pt was seen for skilled ST targeting dysphagia and cognition. SLP facilitated session with Moderate verbal and visual cues for organization and problem solving in order to pt to complete a basic money management task with cash/change. 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 She had fair insight into level of difficulty it presented today and was receptive to education regarding changes in left scanning, problem solving, and attention since acute CVA. Pt required overall Mod A verbal cues for redirection to tasks.  SLP further facilitated session with upgraded trials of thin H2O and ice chips in accordance with water protocol (waited 30 mins after lunch and pt completed oral care). Pt exhibited 1 immediate coughing episode following a sip of thin H2O out of consumption of ~10 ice chips and ~3oz H2O. Recommend repeat MBSS tomorrow to reassess potential for upgrade. Pt left sitting in wheelchair with alarm set and needs within reach. Continue per  current plan of care.        Pain Pain Assessment Pain Scale: Faces Faces Pain Scale: No hurt  Therapy/Group: Individual Therapy  [name] 10/28/2019, 7:11 AM

## 2019-10-28 NOTE — Progress Notes (Addendum)
Advanced Heart Failure Rounding Note  PCP-Cardiologist: Julien Nordmann, MD   Subjective:   cath 09/27/19 3v CAD. EF 20% - right groin hematoma. U/s 3/27 negative for PSA - 3/28 altered mental status due to pain meds. Head CT, ABG and ammonia normal - 09/30/19 CVA, ACS with DES LAD and mid CX - 3/30 Headache with stat CT. EEG- no seizure, cortical dysfunction in the right hemisphere likely secondary to underlying infarct as well as moderate to severe diffuse encephalopathy, nonspecific to etiology. - 3/30 Echo EF 50-55% - 4/6 developed GIB. Hgb 7.3. Transfused x 2 units. Urgent EGD 4/6 showed an actively bleeding duodenal bulb ulcer with visible vessel, treated with epi and hemostasis clips x 5. Hemostasis achieved  -4/7 Got another unit of RBCs. Hgb up to 9.1. Placed on protonix drip.    Feeling a stronger. Denies chest pain. Able to move left hand some.    Objective:   Weight Range: 64.5 kg Body mass index is 26.01 kg/m.   Vital Signs:   Temp:  [98 F (36.7 C)-98.4 F (36.9 C)] 98.4 F (36.9 C) (04/26 0313) Pulse Rate:  [72-79] 79 (04/26 0313) Resp:  [16-18] 16 (04/26 0313) BP: (142-179)/(67-78) 179/78 (04/26 0313) SpO2:  [99 %-100 %] 100 % (04/26 0313) Weight:  [64.5 kg] 64.5 kg (04/26 0313) Last BM Date: 10/25/19  Weight change: Filed Weights   10/26/19 0432 10/27/19 0500 10/28/19 0313  Weight: 65.8 kg 67.5 kg 64.5 kg    Intake/Output:   Intake/Output Summary (Last 24 hours) at 10/28/2019 1229 Last data filed at 10/28/2019 0830 Gross per 24 hour  Intake 840 ml  Output --  Net 840 ml      Physical Exam    General:  Sitting in the wheel chair. No resp difficulty HEENT: normal Neck: supple. no JVD. Carotids 2+ bilat; no bruits. No lymphadenopathy or thryomegaly appreciated. Cor: PMI nondisplaced. Regular rate & rhythm. No rubs, gallops or murmurs. Lungs: clear Abdomen: soft, nontender, nondistended. No hepatosplenomegaly. No bruits or masses. Good bowel  sounds. Extremities: no cyanosis, clubbing, rash, edema. Able to move left had some. LLE weak Neuro: alert & orientedx3, cranial nerves grossly intact. moves all 4 extremities w/o difficulty. Affect pleasant     Labs    CBC Recent Labs    10/26/19 1055  WBC 7.0  NEUTROABS 4.5  HGB 11.0*  HCT 34.3*  MCV 93.7  PLT 278   Basic Metabolic Panel No results for input(s): NA, K, CL, CO2, GLUCOSE, BUN, CREATININE, CALCIUM, MG, PHOS in the last 72 hours. Liver Function Tests No results for input(s): AST, ALT, ALKPHOS, BILITOT, PROT, ALBUMIN in the last 72 hours. No results for input(s): LIPASE, AMYLASE in the last 72 hours. Cardiac Enzymes No results for input(s): CKTOTAL, CKMB, CKMBINDEX, TROPONINI in the last 72 hours.  BNP: BNP (last 3 results) No results for input(s): BNP in the last 8760 hours.  ProBNP (last 3 results) No results for input(s): PROBNP in the last 8760 hours.   D-Dimer No results for input(s): DDIMER in the last 72 hours. Hemoglobin A1C No results for input(s): HGBA1C in the last 72 hours. Fasting Lipid Panel No results for input(s): CHOL, HDL, LDLCALC, TRIG, CHOLHDL, LDLDIRECT in the last 72 hours. Thyroid Function Tests No results for input(s): TSH, T4TOTAL, T3FREE, THYROIDAB in the last 72 hours.  Invalid input(s): FREET3  Other results:   Imaging    No results found.   Medications:     Scheduled Medications: .  atorvastatin  80 mg Oral q1800  . carvedilol  6.25 mg Oral BID WC  . cephALEXin  250 mg Oral Q8H  . clopidogrel  75 mg Oral Q breakfast  . docusate sodium  100 mg Oral BID  . feeding supplement  1 Container Oral BID BM  . feeding supplement (PRO-STAT SUGAR FREE 64)  30 mL Oral BID  . insulin aspart  0-15 Units Subcutaneous TID WC  . [START ON 10/29/2019] losartan  25 mg Oral Daily  . magnesium gluconate  500 mg Oral Daily  . melatonin  3 mg Oral QHS  . multivitamin with minerals  1 tablet Oral Daily  . pantoprazole  40 mg Oral  BID    Infusions: . magnesium sulfate bolus IVPB      PRN Medications: acetaminophen **OR** acetaminophen, haloperidol **OR** haloperidol lactate, nitroGLYCERIN, polyethylene glycol, polyvinyl alcohol, Resource ThickenUp Clear, senna-docusate     Assessment/Plan   1. CAD with NSTEMI - cath with severe diffuse 3v CAD particularly on left side with severe LV dysfunction. Ideally will need CABG, however in looking at her films worry about the quality of her LAD target and also not sure she would benefit much from a graft to the RCA. Concerned about her mobility especially after recent fall an groin hematoma and low albumin - s/p PCI of pLAD w/ DES x 1 + PCI of mLCX w/ DES x 1 - CMRI- LVEF 29% RV 37% Global hypokinesis.  - 2D echo 10/02/19 showed improved EF, up to 50-55% - No chest pain.  - continue Plavix and statin. Hold aspirin with GI bleed.   2. Acute systolic HF due to NICM - Initial EF ~25% - repeat echo 3/31, EF normalized, 50-55% -Per neurology allow SBP 130-150s  - Continue low dose carvedilol   - Increase losartan to 25 mg daiy.   3. R groin hematoma after fall - has hematoma at cath site.  - u/s negative for PSA - CT + hematoma. No mention of fracture but not completely imaged. - Resolved.   4. Hypokalemia/Hypomag K 3.7 on 4/23, check bmet in am  - Check Mag in am. .   5. Severe protein calorie malnutrition  - albumin 2.5   6 . Stroke, Acute  -CT 3/28 No acute findings  -CT 3/29 at 0500 R basal infact, small r pontine infact.  -CT 3/30 - Acute infarct centered in the right parietal lobe correlating with perfusion defects. Age-indeterminate infarct at the right caudate that is stable from yesterday. Continues to improve.   -PT/OT/Speech on CIR   7. NSVT On low dose carvedilol   8. Anemia, GI bleed.  H/O gastric ulcers years ago per son.  - Urgent EGD 4/6 showed an actively bleeding duodenal bulb ulcer with visible vessel, treated with epi and  hemostasis clips x 5. Hemostasis achieved  - No further bleeding. Continue protonix 40 mg twice a day.  - ASA discontinued. Remains on Plavix for recently placed coronary DES    Length of Stay: 12  Brittney Becket, NP  10/28/2019, 12:29 PM  Advanced Heart Failure Team Pager [phone] (M-F; 7a - 4p)  Please contact [facility] for night-coverage after hours (4p -7a ) and weekends on [email or web address]  Patient seen and examined with the above-signed Advanced Practice Provider and/or Housestaff. I personally reviewed laboratory data, imaging studies and relevant notes. I independently examined the patient and formulated the important aspects of the plan. I have edited the note to reflect any of my changes  or salient points. I have personally discussed the plan with the patient and/or family.  Strength and speech improving with rehab. Denies CP or HF symptoms. No further bleeding on Plavix.   BP has been running high with SBP 130-170s.   General:  Sitting up  No resp difficulty HEENT: normal + left facial droop  Neck: supple. no JVD. Carotids 2+ bilat; no bruits. No lymphadenopathy or thryomegaly appreciated. Cor: PMI nondisplaced. Regular rate & rhythm. No rubs, gallops or murmurs. Lungs: clear Abdomen: soft, nontender, nondistended. No hepatosplenomegaly. No bruits or masses. Good bowel sounds. Extremities: no cyanosis, clubbing, rash, edema Neuro: alert & orientedx3, cranial nerves grossly intact.weak L arm mildly dysarthric  Improving from Neuro standpoint. No angina or HF. Continue Plavix. BP is up. Agree with adding losartan. Appreciate CIR care.   Family requesting Psych Consult for nightmares/agitation. Will defer to CIR team on calling this.   Brittney Bickers, MD  6:26 PM

## 2019-10-28 NOTE — Plan of Care (Signed)
  Problem: Consults Goal: RH STROKE PATIENT EDUCATION Description: See Patient Education module for education specifics  Outcome: Progressing Goal: Nutrition Consult-if indicated Outcome: Progressing   Problem: RH BOWEL ELIMINATION Goal: RH STG MANAGE BOWEL WITH ASSISTANCE Description: STG Manage Bowel with mod I Assistance. Outcome: Progressing Goal: RH STG MANAGE BOWEL W/MEDICATION W/ASSISTANCE Description: STG Manage Bowel with Medication with mod I Assistance. Outcome: Progressing   Problem: RH BLADDER ELIMINATION Goal: RH STG MANAGE BLADDER WITH ASSISTANCE Description: STG Manage Bladder With mod I Assistance Outcome: Progressing   Problem: RH SKIN INTEGRITY Goal: RH STG SKIN FREE OF INFECTION/BREAKDOWN Description: Patient will not have new areas of skin break down  Outcome: Progressing Goal: RH STG MAINTAIN SKIN INTEGRITY WITH ASSISTANCE Description: STG Maintain Skin Integrity With mod I Assistance. Outcome: Progressing   Problem: RH SAFETY Goal: RH STG ADHERE TO SAFETY PRECAUTIONS W/ASSISTANCE/DEVICE Description: STG Adhere to Safety Precautions With Assistance/Device. Outcome: Progressing   Problem: RH COGNITION-NURSING Goal: RH STG USES MEMORY AIDS/STRATEGIES W/ASSIST TO PROBLEM SOLVE Description: STG Uses Memory Aids/Strategies With Assistance to Problem Solve. Outcome: Progressing Goal: RH STG ANTICIPATES NEEDS/CALLS FOR ASSIST W/ASSIST/CUES Description: STG Anticipates Needs/Calls for Assist With Assistance/Cues. Outcome: Progressing   Problem: RH PAIN MANAGEMENT Goal: RH STG PAIN MANAGED AT OR BELOW PT'S PAIN GOAL Description: Pain scale <4/10 Outcome: Progressing   Problem: RH KNOWLEDGE DEFICIT Goal: RH STG INCREASE KNOWLEDGE OF HYPERTENSION Description: Patient will maintain SBP less than 150 Outcome: Progressing Goal: RH STG INCREASE KNOWLEDGE OF DYSPHAGIA/FLUID INTAKE Description: Patient will tolerate full diet with thin liquids Outcome:  Progressing Goal: RH STG INCREASE KNOWLEDGE OF STROKE PROPHYLAXIS Description: Patient will adhere to medication regimen and dietary modifications Outcome: Progressing   Problem: RH Vision Goal: RH LTG Vision (Specify) Outcome: Progressing   

## 2019-10-29 ENCOUNTER — Inpatient Hospital Stay (HOSPITAL_COMMUNITY): Payer: Medicare Other | Admitting: Physical Therapy

## 2019-10-29 ENCOUNTER — Inpatient Hospital Stay (HOSPITAL_COMMUNITY): Payer: Medicare Other | Admitting: Occupational Therapy

## 2019-10-29 ENCOUNTER — Inpatient Hospital Stay (HOSPITAL_COMMUNITY): Payer: Medicare Other

## 2019-10-29 ENCOUNTER — Encounter (HOSPITAL_COMMUNITY): Payer: Medicare Other | Admitting: Speech Pathology

## 2019-10-29 DIAGNOSIS — R350 Frequency of micturition: Secondary | ICD-10-CM

## 2019-10-29 LAB — MAGNESIUM: Magnesium: 1.4 mg/dL — ABNORMAL LOW (ref 1.7–2.4)

## 2019-10-29 LAB — BASIC METABOLIC PANEL
Anion gap: 9 (ref 5–15)
BUN: 19 mg/dL (ref 8–23)
CO2: 26 mmol/L (ref 22–32)
Calcium: 10.1 mg/dL (ref 8.9–10.3)
Chloride: 104 mmol/L (ref 98–111)
Creatinine, Ser: 0.91 mg/dL (ref 0.44–1.00)
GFR calc Af Amer: >60 mL/min (ref >60)
GFR calc non Af Amer: >60 mL/min (ref >60)
Glucose, Bld: 126 mg/dL — ABNORMAL HIGH (ref 70–99)
Potassium: 3.8 mmol/L (ref 3.5–5.1)
Sodium: 139 mmol/L (ref 135–145)

## 2019-10-29 LAB — GLUCOSE, CAPILLARY
Glucose-Capillary: 123 mg/dL — ABNORMAL HIGH (ref 70–99)
Glucose-Capillary: 186 mg/dL — ABNORMAL HIGH (ref 70–99)
Glucose-Capillary: 132 mg/dL — ABNORMAL HIGH (ref 70–99)
Glucose-Capillary: 133 mg/dL — ABNORMAL HIGH (ref 70–99)

## 2019-10-29 MED ORDER — DICLOFENAC SODIUM 1 % EX GEL
2.0000 g | Freq: Four times a day (QID) | CUTANEOUS | Status: DC
  Administered 2019-10-29 – 2019-11-14 (×53): 2 g via TOPICAL
  Filled 2019-10-29: qty 100

## 2019-10-29 MED ORDER — MAGNESIUM SULFATE 4 GM/100ML IV SOLN
4.0000 g | Freq: Once | INTRAVENOUS | Status: AC
  Administered 2019-10-29: 11:00:00 4 g via INTRAVENOUS
  Filled 2019-10-29: qty 100

## 2019-10-29 NOTE — Plan of Care (Signed)
  Problem: Consults Goal: RH STROKE PATIENT EDUCATION Description: See Patient Education module for education specifics  Outcome: Progressing Goal: Nutrition Consult-if indicated Outcome: Progressing   Problem: RH BOWEL ELIMINATION Goal: RH STG MANAGE BOWEL WITH ASSISTANCE Description: STG Manage Bowel with mod I Assistance. Outcome: Progressing Goal: RH STG MANAGE BOWEL W/MEDICATION W/ASSISTANCE Description: STG Manage Bowel with Medication with mod I Assistance. Outcome: Progressing   Problem: RH BLADDER ELIMINATION Goal: RH STG MANAGE BLADDER WITH ASSISTANCE Description: STG Manage Bladder With mod I Assistance Outcome: Progressing   Problem: RH SKIN INTEGRITY Goal: RH STG SKIN FREE OF INFECTION/BREAKDOWN Description: Patient will not have new areas of skin break down  Outcome: Progressing Goal: RH STG MAINTAIN SKIN INTEGRITY WITH ASSISTANCE Description: STG Maintain Skin Integrity With mod I Assistance. Outcome: Progressing   Problem: RH SAFETY Goal: RH STG ADHERE TO SAFETY PRECAUTIONS W/ASSISTANCE/DEVICE Description: STG Adhere to Safety Precautions With Assistance/Device. Outcome: Progressing   Problem: RH COGNITION-NURSING Goal: RH STG USES MEMORY AIDS/STRATEGIES W/ASSIST TO PROBLEM SOLVE Description: STG Uses Memory Aids/Strategies With Assistance to Problem Solve. Outcome: Progressing Goal: RH STG ANTICIPATES NEEDS/CALLS FOR ASSIST W/ASSIST/CUES Description: STG Anticipates Needs/Calls for Assist With Assistance/Cues. Outcome: Progressing   Problem: RH PAIN MANAGEMENT Goal: RH STG PAIN MANAGED AT OR BELOW PT'S PAIN GOAL Description: Pain scale <4/10 Outcome: Progressing   Problem: RH KNOWLEDGE DEFICIT Goal: RH STG INCREASE KNOWLEDGE OF HYPERTENSION Description: Patient will maintain SBP less than 150 Outcome: Progressing Goal: RH STG INCREASE KNOWLEDGE OF DYSPHAGIA/FLUID INTAKE Description: Patient will tolerate full diet with thin liquids Outcome:  Progressing Goal: RH STG INCREASE KNOWLEDGE OF STROKE PROPHYLAXIS Description: Patient will adhere to medication regimen and dietary modifications Outcome: Progressing   Problem: RH Vision Goal: RH LTG Vision (Specify) Outcome: Progressing   

## 2019-10-29 NOTE — Progress Notes (Signed)
Occupational Therapy Session Note  Patient Details  Name: Brittney Kennedy MRN: [id number] Date of Birth: 1950/11/17  Today's Date: 10/29/2019 OT Individual Time: 1125-1205 OT Individual Time Calculation (min): 40 min    Short Term Goals: Week 2:  OT Short Term Goal 1 (Week 2): Pt will demonstrate improved attention and ability to follow directions to don shirt with verbal cues and set up. OT Short Term Goal 2 (Week 2): Pt will don pants over L foot with steadying A. OT Short Term Goal 3 (Week 2): Pt will use L hand as a stabilizing A when opening/closing containers. OT Short Term Goal 4 (Week 2): Pt will be able to lift L shoulder to 45 degrees abduction to be able to wash under arm pit easily.  Skilled Therapeutic Interventions/Progress Updates:    Pt received in wc with IV pump attached to R hand. Pt needed to toilet so brought BSC out to pt adjacent to her wc.  Mod A sit to stand and stand pivot with cues for advancing her L foot to turn L to [facility].  Mod A to hold balance with A to pull pants down as pt could not use her R hand due to the IV.  A with cleansing and pulling up pants,  Mod A stand pivot to R.    In wc, focused on LUE AROM.  Pt tolerates gravity supported sh flexion to 90 but when asked to push her arm straight she c/o pain.  Focused on tricep extension, grasp and wrist ext. She now has 3+/5 gross grasp.  Pt in room with her son in law. Belt alarm on and all needs met.   Therapy Documentation Precautions:  Precautions Precautions: Fall, Other (comment) Precaution Comments: aspiration precautions (HOB >30 degrees), significant L inattention/neglect, signifcant L hemiparesis (UE>LE with L shoulder subluxation) Restrictions Weight Bearing Restrictions: No  Pain: c/o L shoulder pain with resistive exercises      Therapy/Group: Individual Therapy  [place] 10/29/2019, 12:21 PM

## 2019-10-29 NOTE — Progress Notes (Addendum)
Modified Barium Swallow Progress Note  Patient Details  Name: Brittney Kennedy MRN: [id number] Date of Birth: Feb 18, 1951  Today's Date: 10/29/2019  Modified Barium Swallow completed.  Full report located under Chart Review in the Imaging Section.  Brief recommendations include the following:  Clinical Impression   Pt presents with moderate oropharyngeal dysphagia most remarkable for deep penetration and trace aspiration (PAS scale 5 and 7) of thin barium without use of compensatory strategies due to incomplete laryngeal closure. Pt's swallow initiation is much more timely today in comparison to last MBSS, with delay to pyriform sinuses only observed on very first presentation of thin. She also demonstrated greatly improved ability to execute a left head turn prior to swallow, which prevented aspiration in 100% of trials of thin barium with use of this strategy. Recommend pt upgrade to thin liquids but must turn head to LEFT with every sip, use slow rate, small bite/sips, continue dysphagia 2 solids and pills in puree. Pt will require full supervision to ensure use of compensatory swallow strategies.    Swallow Evaluation Recommendations       SLP Diet Recommendations: Dysphagia 2 (Fine chop) solids;Thin liquid   Liquid Administration via: Cup   Medication Administration: Whole meds with puree   Supervision: Full supervision/cueing for compensatory strategies   Compensations: Minimize environmental distractions;Slow rate;Small sips/bites(turn head LEFT with every sip of thin)   Postural Changes: Remain semi-upright after after feeds/meals (Comment);Seated upright at 90 degrees   Oral Care Recommendations: Oral care BID        [name] 10/29/2019,12:55 PM

## 2019-10-29 NOTE — Progress Notes (Signed)
[facility] PHYSICAL MEDICINE & REHABILITATION PROGRESS NOTE  Subjective/Complaints: Patient seen laying in bed this morning.  She states she slept well overnight.  She is seen by heart failure yesterday, notes reviewed-ARB adjusted.  ROS: Denies CP, shortness of breath, nausea, vomiting, diarrhea.  Objective: Vital Signs: Blood pressure (!) 144/75, pulse 72, temperature 98.4 F (36.9 C), temperature source Oral, resp. rate 18, height 5\' 2"  (1.575 m), weight 65.5 kg, SpO2 99 %. No results found. Recent Labs    10/26/19 1055  WBC 7.0  HGB 11.0*  HCT 34.3*  PLT 278   Recent Labs    10/29/19 0603  NA 139  K 3.8  CL 104  CO2 26  GLUCOSE 126*  BUN 19  CREATININE 0.91  CALCIUM 10.1    Physical Exam: BP (!) 144/75 (BP Location: Right Arm)   Pulse 72   Temp 98.4 F (36.9 C) (Oral)   Resp 18   Ht 5\' 2"  (1.575 m)   Wt 65.5 kg   SpO2 99%   BMI 26.41 kg/m  Constitutional: No distress . Vital signs reviewed. HENT: Normocephalic.  Atraumatic. Eyes: Keeps eyes closed.  No discharge. Cardiovascular: No JVD. Respiratory: Normal effort.  No stridor. GI: Non-distended. Skin: Warm and dry.  Intact. Psych: Limited engagement. Musc: No edema in extremities.  No tenderness in extremities. Neuro: Alert Left inattention Dysarthria, unchanged Facial weakness, unchanged Motor:  RUE/RLE: 5/5 proximal distal LUE: Shoulder abduction 3/5, distally 4/5 LLE: 4+/5 proximal to distal  Assessment/Plan: 1. Functional deficits secondary to bilateral embolic infarcts.  Which require 3+ hours per day of interdisciplinary therapy in a comprehensive inpatient rehab setting.  Physiatrist is providing close team supervision and 24 hour management of active medical problems listed below.  Physiatrist and rehab team continue to assess barriers to discharge/monitor patient progress toward functional and medical goals  Care Tool:  Bathing    Body parts bathed by patient: Chest, Abdomen,  Front perineal area, Face, Left arm, Right upper leg, Left upper leg, Buttocks   Body parts bathed by helper: Right arm, Left lower leg, Right lower leg Body parts n/a: Right arm, Left arm   Bathing assist Assist Level: Moderate Assistance - Patient 50 - 74%     Upper Body Dressing/Undressing Upper body dressing   What is the patient wearing?: Pull over shirt    Upper body assist Assist Level: Minimal Assistance - Patient > 75%    Lower Body Dressing/Undressing Lower body dressing      What is the patient wearing?: Underwear/pull up, Pants     Lower body assist Assist for lower body dressing: Moderate Assistance - Patient 50 - 74%     Toileting Toileting    Toileting assist Assist for toileting: Moderate Assistance - Patient 50 - 74%     Transfers Chair/bed transfer  Transfers assist     Chair/bed transfer assist level: Moderate Assistance - Patient 50 - 74%     Locomotion Ambulation   Ambulation assist      Assist level: 2 helpers Assistive device: Walker-rolling Max distance: 75'   Walk 10 feet activity   Assist     Assist level: 2 helpers Assistive device: Walker-rolling   Walk 50 feet activity   Assist Walk 50 feet with 2 turns activity did not occur: Safety/medical concerns         Walk 150 feet activity   Assist Walk 150 feet activity did not occur: Safety/medical concerns  Walk 10 feet on uneven surface  activity   Assist Walk 10 feet on uneven surfaces activity did not occur: Safety/medical concerns         Wheelchair     Assist Will patient use wheelchair at discharge?: (TBD)             Wheelchair 50 feet with 2 turns activity    Assist            Wheelchair 150 feet activity     Assist            Medical Problem List and Plan: 1.  Altered mental status left hemiparesis secondary to right MCA infarction along with a small left CR and splenium infarct embolic pattern likely  due to transient cardiomyopathy with low EF after cardiac catheterization for non-STEMI with stenting complicated by groin hematoma  Continue CIR  Repeat CT unremarkable for changes 2.  Antithrombotics: -DVT/anticoagulation: SCDs             -antiplatelet therapy: Plavix 3. Pain Management: Tylenol as needed  Voltaren gel to bilateral knees ordered on 4/27 4. Mood: Amantadine 100 mg twice daily, DC'd secondary to hallucinations  Melatonin 3 mg nightly             -antipsychotic agents: N/A 5. Neuropsych: This patient is not capable of making decisions on her own behalf. 6. Skin/Wound Care: Routine skin checks 7. Fluids/Electrolytes/Nutrition: Routine in and outs.  8.  GI bleed/duodenal ulcer.  Status post EGD 10/08/2019 with epi and hemostasis clips x5.  PPI twice daily             See #9 9.  Acute blood loss anemia.    Hemoglobin 11.0 on 4/24  Continue to monitor 10.  AKI.  Resolved.  11.  Acute urinary tract infection.  Patient completed 5-day course amoxicillin 10/17/2019 12.  Post stroke dysphagia.  Dysphagia #2 nectar liquids.  Follow-up speech therapy             Advance diet as tolerated 13.  Keratoconjunctivitis.  Follow-up ophthalmology services.  Completed 7-day course of erythromycin drops on 4/17. 14.  Hypertension.     Increased Coreg to 6.25 mg BID with meals  Discussed with pharmacy, started on Losartan 12.5 on 4/22, increased to 25 mg with eventual plans for Entresto 15.  Hyperlipidemia.  Lipitor 16.  Prediabetes.  Hemoglobin A1c 5.9.  SSI CBG (last 3)  Recent Labs    10/28/19 1639 10/28/19 2100 10/29/19 0612  GLUCAP 136* 169* 123*   Borderline elevated on 4/27 17. Acute systolic CHF   Filed Weights   10/27/19 0500 10/28/19 0313 10/29/19 0407  Weight: 67.5 kg 64.5 kg 65.5 kg   Stable on 4/27 18. Hypomagnesemia  Mag 1.4 on 4/27  Supplemented IV on 4/19, again on 4/23, again on 4/27  Oral supplementation initiated  19.  Hypokalemia  Potassium 3.7 on 4/23  after supplementation 20. Mod/Severe Hypoalbuminemia  Supplement initiated 21.  Urinary frequency  PVRs ordered  UA unremarkable, urine culture minimal growth   LOS: 13 days A FACE TO FACE EVALUATION WAS PERFORMED  Jamesen Stahnke Lorie Phenix 10/29/2019, 8:20 AM

## 2019-10-29 NOTE — Progress Notes (Signed)
Physical Therapy Session Note  Patient Details  Name: Brittney Kennedy MRN: [id number] Date of Birth: 04/14/1951  Today's Date: 10/29/2019 PT Individual Time: 2992-4268 and 1402-1430 and 3419-6222 PT Individual Time Calculation (min): 45 min and 28 min and 49 min  Short Term Goals: Week 2:  PT Short Term Goal 1 (Week 2): Supine to sit w/cga anc veral cues using bed features PT Short Term Goal 2 (Week 2): wc to/from bed w/min assist and cues w/LRAD PT Short Term Goal 3 (Week 2): Gait 41ft w/ mod assist w/LRAD  Skilled Therapeutic Interventions/Progress Updates:    Session 1: Pt received supine in bed with RN present for morning medications and pt agreeable to therapy session. Supine>sit L EOB with mod assist for trunk upright and manual facilitation for L UE positioning to increase WBing and assist with trunk upright with cuing to avoid compensation by pulling self to midline with R arm. Sitting EOB with CGA donned pants with max assist. Sit<>stand with min/mod assist for lifting and balance while pulling pants over hips with mod assist. NT delivered pt's meal tray. Sitting EOB with supervision for trunk control therapist provided full supervision for meal while targeting L visual scanning and attention as well as L UE NMR via forced WBing throughout or forced use for bimanual tasks with max/total assist to integrate hand into task. Pt reports need to use bathroom. L stand pivot to w/c with min/mod assist for balance. Transported in/out of bathroom in w/c. Stand pivot w/c<>BSC over toilet with mod assist for balance and mod assist for LB clothing management. Continent of bladder and performed seated peri-care with min assist for toilet paper management. Transported to sink and pt becoming emotional stating she was "stressed out" about her swallow study and didn't want to stand to wash her sink because that would "stress" her more. Therapist provided emotional support. Seated hand hygiene with max assist  for L hand integration in task. Pt left seated in w/c with needs in reach and seat belt alarm on.   Session 2: Pt received sitting in w/c and agreeable to therapy session but states needing to use bathroom. Transported in/out of bathroom in w/c. Stand pivot w/c<>BSC over toilet with min/mod assist for balance and cuing/manual facilitation for increased L hip/knee extension and R/L lateral weight shift. Standing with min/mod assist for balance performed LB clothing management with mod assist. Continent of bladder and performed seated peri-care with min assist for grasping toilet paper. Standing hand hygiene at sink with min/mod assist for balance due to L lateral lean - cuing for increased L hip/knee extension - max assist for integration of L UE in task. R stand pivot w/c>EOB, no AD, with min/mod assist and manual facilitation for R/L lateral weight shifting onto stance limb. Sit>supine with min assist for trunk descent and with increased time pt able to place BLEs into the bed. Supine bridging x12 reps with cuing for isometric hold at top. Sit>supine L EOB with mod assist for trunk upright for pt to consume water as she is now on thin liquids. Pt requesting to rest until next therapy session - returned to supine as described above and left with needs in reach and bed alarm on.  Session 3: Pt received supine in bed, asleep but easily awakens and agreeable to therapy session. Supine>sitting L EOB with mod assist for trunk upright - cuing for increased pt independence and manual facilitation for L UE WBing to assist with trunk upright - cuing to avoid compensation  for poor trunk control via pulling on footboard with R UE. Sitting EOB donned pants with CGA for trunk control and max assist. Sit>standing, no AD, with min/mod assist for balance due to L lateral lean from L hip/knee flexed and lack of full trunk extension - mod assist for pulling pants over hips. L stand pivot to w/c with min/mod assist targeting L  attention with cuing for visual scanning.  Transported to/from gym in w/c for time management and energy conservation. L stand pivot to EOM with min/mod assist. Repeated sit<>stands progressed to standing mini-squats without UE support x12reps and level 1 theraband around knees to active hip abductor muscles min/mod assist for balance due to L lateral lean - mirror feedback and multimodal cuing for increased L LE weightbearing. Progressed to pre-gait training targeting L LE stance phase for hip/knee extension and dynamic balance via stepping R LE forward/backwards to external target min/mod assist for balance x10reps. Gait training 46ftx2 and 18ft x1 using RW with min/mod assist for balance and forward progression of AD - cuing for increased L LE hip abduction during swing due to intermittent scissoring - manual facilitation for forward progression of pelvis and R/L lateral weight shifting onto stance limb. L LE NMR with hip/knee extensor strengthening using 1step on stairs (6") and B UE support on HRs with mod/max assist performed L LE step up/down while placing R LE on 2nd step x 5 reps - manual facilitation and multimodal cuing for increased motor recruitment of L LE hip extensors. Throughout session targeted L attention via having pt scan to locate items on L side of gym. Transported back to room and left seated in w/c with needs in reach and seat belt alarm on.  Therapy Documentation Precautions:  Precautions Precautions: Fall, Other (comment) Precaution Comments: aspiration precautions (HOB >30 degrees), significant L inattention/neglect, signifcant L hemiparesis (UE>LE with L shoulder subluxation) Restrictions Weight Bearing Restrictions: No  Pain:   Session 1: No reports of pain throughout session.  Session 2: Reports B hip pain after performing bridging task but defers medication administration - limited repetitions and provided rest breaks as needed.  Session 3: Reports headache pain but  declines medication. Reports R shoulder becoming sore after ambulation - likely due to compensation with increased WBing in that UE.    Therapy/Group: Individual Therapy  Tawana Scale, PT, DPT 10/29/2019, 7:51 AM

## 2019-10-29 NOTE — Progress Notes (Signed)
Pt voiced concerns about her frequent voiding of small amounts. Pt states " I do not under stand why this is happening". Provider will be notified during morning rounds.

## 2019-10-30 ENCOUNTER — Inpatient Hospital Stay (HOSPITAL_COMMUNITY): Payer: Medicare Other | Admitting: Physical Therapy

## 2019-10-30 ENCOUNTER — Inpatient Hospital Stay (HOSPITAL_COMMUNITY): Payer: Medicare Other | Admitting: *Deleted

## 2019-10-30 ENCOUNTER — Inpatient Hospital Stay (HOSPITAL_COMMUNITY): Payer: Medicare Other | Admitting: Speech Pathology

## 2019-10-30 ENCOUNTER — Inpatient Hospital Stay (HOSPITAL_COMMUNITY): Payer: Medicare Other

## 2019-10-30 LAB — BASIC METABOLIC PANEL
Anion gap: 11 (ref 5–15)
BUN: 18 mg/dL (ref 8–23)
CO2: 24 mmol/L (ref 22–32)
Calcium: 10 mg/dL (ref 8.9–10.3)
Chloride: 106 mmol/L (ref 98–111)
Creatinine, Ser: 0.98 mg/dL (ref 0.44–1.00)
GFR calc Af Amer: >60 mL/min (ref >60)
GFR calc non Af Amer: 60 mL/min — ABNORMAL LOW (ref >60)
Glucose, Bld: 133 mg/dL — ABNORMAL HIGH (ref 70–99)
Potassium: 4 mmol/L (ref 3.5–5.1)
Sodium: 141 mmol/L (ref 135–145)

## 2019-10-30 LAB — GLUCOSE, CAPILLARY
Glucose-Capillary: 128 mg/dL — ABNORMAL HIGH (ref 70–99)
Glucose-Capillary: 117 mg/dL — ABNORMAL HIGH (ref 70–99)
Glucose-Capillary: 152 mg/dL — ABNORMAL HIGH (ref 70–99)
Glucose-Capillary: 159 mg/dL — ABNORMAL HIGH (ref 70–99)

## 2019-10-30 LAB — MAGNESIUM: Magnesium: 1.9 mg/dL (ref 1.7–2.4)

## 2019-10-30 MED ORDER — LOSARTAN POTASSIUM 50 MG PO TABS
50.0000 mg | ORAL_TABLET | Freq: Every day | ORAL | Status: DC
  Administered 2019-10-31 – 2019-11-02 (×3): 50 mg via ORAL
  Filled 2019-10-30 (×3): qty 1

## 2019-10-30 MED ORDER — LOSARTAN POTASSIUM 50 MG PO TABS
25.0000 mg | ORAL_TABLET | Freq: Once | ORAL | Status: AC
  Administered 2019-10-30: 09:00:00 25 mg via ORAL

## 2019-10-30 NOTE — Telephone Encounter (Signed)
Oh no, sorry to see this, maybe we can circle back and check her chart in another few weeks. Thanks

## 2019-10-30 NOTE — Plan of Care (Signed)
  Problem: Consults Goal: RH STROKE PATIENT EDUCATION Description: See Patient Education module for education specifics  Outcome: Progressing Goal: Nutrition Consult-if indicated Outcome: Progressing   Problem: RH BOWEL ELIMINATION Goal: RH STG MANAGE BOWEL WITH ASSISTANCE Description: STG Manage Bowel with mod I Assistance. Outcome: Progressing Goal: RH STG MANAGE BOWEL W/MEDICATION W/ASSISTANCE Description: STG Manage Bowel with Medication with mod I Assistance. Outcome: Progressing   Problem: RH BLADDER ELIMINATION Goal: RH STG MANAGE BLADDER WITH ASSISTANCE Description: STG Manage Bladder With mod I Assistance Outcome: Progressing   Problem: RH SKIN INTEGRITY Goal: RH STG SKIN FREE OF INFECTION/BREAKDOWN Description: Patient will not have new areas of skin break down  Outcome: Progressing Goal: RH STG MAINTAIN SKIN INTEGRITY WITH ASSISTANCE Description: STG Maintain Skin Integrity With mod I Assistance. Outcome: Progressing   Problem: RH SAFETY Goal: RH STG ADHERE TO SAFETY PRECAUTIONS W/ASSISTANCE/DEVICE Description: STG Adhere to Safety Precautions With Assistance/Device. Outcome: Progressing   Problem: RH COGNITION-NURSING Goal: RH STG USES MEMORY AIDS/STRATEGIES W/ASSIST TO PROBLEM SOLVE Description: STG Uses Memory Aids/Strategies With Assistance to Problem Solve. Outcome: Progressing Goal: RH STG ANTICIPATES NEEDS/CALLS FOR ASSIST W/ASSIST/CUES Description: STG Anticipates Needs/Calls for Assist With Assistance/Cues. Outcome: Progressing   Problem: RH PAIN MANAGEMENT Goal: RH STG PAIN MANAGED AT OR BELOW PT'S PAIN GOAL Description: Pain scale <4/10 Outcome: Progressing   Problem: RH KNOWLEDGE DEFICIT Goal: RH STG INCREASE KNOWLEDGE OF HYPERTENSION Description: Patient will maintain SBP less than 150 Outcome: Progressing Goal: RH STG INCREASE KNOWLEDGE OF DYSPHAGIA/FLUID INTAKE Description: Patient will tolerate full diet with thin liquids Outcome:  Progressing Goal: RH STG INCREASE KNOWLEDGE OF STROKE PROPHYLAXIS Description: Patient will adhere to medication regimen and dietary modifications Outcome: Progressing   Problem: RH Vision Goal: RH LTG Vision (Specify) Outcome: Progressing

## 2019-10-30 NOTE — Progress Notes (Signed)
Speech Language Pathology Daily Session Note  Patient Details  Name: Brittney Kennedy MRN: [id number] Date of Birth: 1951/07/04  Today's Date: 10/30/2019 SLP Individual Time: 7322-0254 SLP Individual Time Calculation (min): 45 min  Short Term Goals: Week 2: SLP Short Term Goal 1 (Week 2): Pt will sustain attention to basic familiar task for ~ 5 minutes with Min A cues. SLP Short Term Goal 2 (Week 2): With Mod A cues, pt will utilize speech intelligibilty strategies to increase intelligibility to ~ 85% at sentence level. SLP Short Term Goal 3 (Week 2): Pt will consume least restrictive diet with efficient mastication and oral clearance with Supervision A cues for swallow strategies. SLP Short Term Goal 4 (Week 2): Pt will demonstrate readiness for repeat MBSS by consuming trials of thin via water protocol with minimal overt s/sx aspiration over 2 more consecutive sessions.  Skilled Therapeutic Interventions: Pt was seen for skilled ST targeting dysphagia and cognitive goals. She required Moderate verbal cues for accuracy in use of left head turn as compensatory swallow strategy with thin liquid intake during first half of her meal. However, she became more independent and accurate in use of her strategy during second half. Pt exhibited 1 slightly delayed cough response following a sip of thin. She demonstrated functional mastication and oral clearance of puree and Dys 2 (minced) solid items from her breakfast tray with 1 verbal cue for clearance of left buccal pocketing. Recommend continue current diet with full supervision during meals. Pt was independently oriented X4. She did require a more than reasonable time to complete tasks throughout session, due to attention deficits. Mod A multimodal cues required for sustained attention in a controlled environment. She completed a basic trail making task with Mod A for visual scanning and attention to task, although no difficulty presented with sequencing. Pt  left sitting in bed with alarm set and needs within reach.      Pain Pain Assessment Pain Scale: 0-10 Pain Score: 0-No pain  Therapy/Group: Individual Therapy  [name] 10/30/2019, 7:00 AM

## 2019-10-30 NOTE — Telephone Encounter (Signed)
Patient is still admitted as of 4-28 - Stroke.

## 2019-10-30 NOTE — Progress Notes (Signed)
Nutrition Follow-up  RD working remotely.  INTERVENTION:   - Continue Boost Breeze po BID, each supplement provides 250 kcal and 9 grams of protein  - Continue Pro-stat 30 ml po BID, each supplement provides 100 kcal and 15 grams of protein  - MVI with minerals daily  - Continue Vital Cuisine Shake TID, each supplement provides 520 kcal and 22 grams of protein (vanilla flavor)  NUTRITION DIAGNOSIS:   Increased nutrient needs related to other (therapies) as evidenced by estimated needs.  Ongoing  GOAL:   Patient will meet greater than or equal to 90% of their needs  Progressing  MONITOR:   PO intake, Supplement acceptance, Weight trends, Labs, Diet advancement  REASON FOR ASSESSMENT:   Malnutrition Screening Tool    ASSESSMENT:   69 yo female admitted with NSTEMI, acute CHF and later had a change in mental status and found to have acute stroke. PMH includes PUD, seasonal allergies, cholecystectomy and left rotator cuff repair.  3/26 - cardiac cath, new onset CHF 3/28 - rapid response due to lethargy, slurred speech, CT head no acute findings 3/29 - CVA: R basal infarct, small R pontine infarct 3/30 - CT acute infact centered in R parietal lobe 4/03 - MBS, dysphagia 1, nectar-thick liquids 4/06 - rapid response due to hypotension and dark tarry liquid stools; s/p EGD 4/14 - admit to CIR 4/27 - MBS with recommendations for Dysphagia 2 diet with thin liquids  Noted pt with a 3.3 kg weight loss since admission to CIR. This is a 4.7% weight loss in less than 1 month which is significant for timeframe. Pt is at risk for malnutrition. Per nursing documentation, pt with non-pitting edema to LUE and LLE which could be masking additional weight loss.  Admit weight: 70.4 kg Current weight: 67.1 kg  Spoke with pt via phone call to room. Pt in good spirits and reports an improved appetite and that she is eating better at meals. Pt reports that she is drinking both the Boost  Breeze and the Vital Cuisine shakes. Pt shares that she does not require thickened liquids anymore but would still like to receive the vanilla shakes with meals in addition to the [employer].  Meal Completion: 10-50% x last 8 meals  Medications reviewed and include: colace, Boost Breeze BID, Pro-stat BID, SSI, magnesium gluconate, MVI with minerals, protonix  Labs reviewed. CBG's: 117-133 x 24 hours  Diet Order:   Diet Order            DIET DYS 2 Room service appropriate? Yes; Fluid consistency: Thin  Diet effective now              EDUCATION NEEDS:   Not appropriate for education at this time  Skin:  Skin Assessment: Reviewed RN Assessment  Last BM:  10/30/19 medium type 4  Height:   Ht Readings from Last 1 Encounters:  10/16/19 5\' 2"  (1.575 m)    Weight:   Wt Readings from Last 1 Encounters:  10/30/19 67.1 kg    BMI:  Body mass index is 27.06 kg/m.  Estimated Nutritional Needs:   Kcal:  1500-1700  Protein:  75-85 grams  Fluid:  >/= 1.5 L    11/01/19, MS, RD, LDN Inpatient Clinical Dietitian Pager: [phone] Weekend/After Hours: [phone]

## 2019-10-30 NOTE — Progress Notes (Signed)
Occupational Therapy Session Note  Patient Details  Name: [name] MRN: [id number] Date of Birth: 1951-04-10  Today's Date: 10/30/2019 OT Individual Time: 0845-1000 OT Individual Time Calculation (min): 75 min    Short Term Goals: Week 2:  OT Short Term Goal 1 (Week 2): Pt will demonstrate improved attention and ability to follow directions to don shirt with verbal cues and set up. OT Short Term Goal 2 (Week 2): Pt will don pants over L foot with steadying A. OT Short Term Goal 3 (Week 2): Pt will use L hand as a stabilizing A when opening/closing containers. OT Short Term Goal 4 (Week 2): Pt will be able to lift L shoulder to 45 degrees abduction to be able to wash under arm pit easily.  Skilled Therapeutic Interventions/Progress Updates:  Pt received from NT having just finished up toileting. Pt reports sinus congestion requesting session be conducted in room close to humidifier. Session focus on functional mobility, ADL retraining and LUE therex. Pt completed LB dressing with MIN A needing assist to pull pants up to waistline on L side and initially advance RLE into pants. Pt completed seated therex included elbow flexion/extension with pt able to achieve ~ 90 degrees AROM; self ROM sh flexion/ extension, wrist pronation/supination, wrist flexion/ extension, digit lifts and digit ABD/ADD from gravity eliminated surface. Pt completed x5 sit<>stands from EOB with RW and MINA. Pt required cues each trial for hand placement and body mechanics. On fifth trial pt able to ambulate to sink with MIN A with RW to complete seated grooming tasks. Pt required supervision for seated oral care needing verbal cues to use LUE as stabilizer during task. Pt left supine in bed with bed alarm activated and all needs within reach.   Therapy Documentation Precautions:  Precautions Precautions: Fall, Other (comment) Precaution Comments: aspiration precautions (HOB >30 degrees), significant L  inattention/neglect, signifcant L hemiparesis (UE>LE with L shoulder subluxation) Restrictions Weight Bearing Restrictions: No General:   Vital Signs:  Pain: Pt reports pain in LUE during seated therex; utilized repositioning and decreasing ROM as pain mgmt strategy. Pt additionally reports general malaise and feeling like her sinuses are stopped up; turned on humidifier per pt request as pain mgmt strategy.   Therapy/Group: Individual Therapy  Angelina Pih 10/30/2019, 11:15 AM

## 2019-10-30 NOTE — Patient Care Conference (Signed)
Inpatient RehabilitationTeam Conference and Plan of Care Update Date: 10/30/2019   Time: 11:00 AM    Patient Name: Brittney Kennedy      Medical Record Number: [id number]  Date of Birth: April 29, 1951 Sex: Female         Room/Bed: 4W12C/4W12C-01 Payor Info: Payor: MEDICARE / Plan: MEDICARE PART A AND B / Product Type: *No Product type* /    Admit Date/Time:  10/16/2019  4:10 PM  Primary Diagnosis:  Embolic infarction [facility])  Patient Active Problem List   Diagnosis Date Noted  . Urinary frequency   . Hypoalbuminemia due to protein-calorie malnutrition (HCC)   . Hypokalemia   . Hypomagnesemia   . Labile blood pressure   . Labile blood glucose   . Acute systolic congestive heart failure (HCC)   . Essential hypertension   . Keratoconjunctivitis   . Right middle cerebral artery stroke (HCC) 10/16/2019  . Embolic infarction (HCC) 10/16/2019  . Prediabetes   . Benign essential HTN   . Dysphagia, post-stroke   . Duodenal ulcer   . Acute blood loss anemia 10/08/2019  . Upper GI bleed   . Status post coronary artery stent placement   . Malnutrition of moderate degree 10/02/2019  . Cerebral embolism with cerebral infarction 10/01/2019  . CAD (coronary artery disease) 09/27/2019  . Acute systolic CHF (congestive heart failure) (HCC) 09/27/2019  . NSTEMI (non-ST elevated myocardial infarction) (HCC) 09/26/2019    Expected Discharge Date: Expected Discharge Date: 11/14/19  Team Members Present: Physician leading conference: Dr. Maryla Morrow Care Coodinator Present: Cheyenne Adas, RN, BSN, CRRN;Christina Vita Barley, BSW;Genie Kalib Bhagat, RN, MSN Nurse Present: Liz Bernard, LPN PT Present: Malachi Pro, PT;Carly Peyton Najjar, PT OT Present: Primitivo Gauze, OT SLP Present: Suzzette Righter, CF-SLP PPS Coordinator present : Edson Snowball, Park Breed, SLP     Current Status/Progress Goal Weekly Team Focus  Bowel/Bladder   Patient is continent of bowel, continent of bladder and can be incontinent at  times due to urgency  remain continent  assist with toileting every 2-3 hours and PRN   Swallow/Nutrition/ Hydration   Dys 2/upgraded to thin with left head turn, Supervision-Min  Supervision  tolerance upgraded diet, Dys 3 trials, carryover strategies, education   ADL's   min UB bathing and dressin, mod LB bathing and dressing, min A stand pivots with grab bar, mod A stand pivots without UE support,  improving L side attention and L grasp.  limited active shoulder range.  min A overall with bathing, LB dressing, toileting; supervision with UB dressing, grooming; min A with transfers, standing balance  ADL training, LUE NMR, balance, L side awareness, cognition, pt/family education   Mobility   mod assist supine>sit, min assist sit>supine, min/mod assist sit<>stand without AD, mod assist stand pivot without AD, gait 44ft using RW with mod assist, 1step navigation using B HRs with max assist  CGA overall at household ambulatory level  transfer training, bed mobility training, standing balance, L LE NMR and strengthening, L attention, pt education, gait training   Communication   Min-Mod A use of intelligibility strategies  Min A  intelligibility strategies at sentence and conversation level   Safety/Cognition/ Behavioral Observations  Mod A, left visual inattention, emergent awareness, problem solving, Min A recall strategies,  min A  sustained attention, recall, problem solving, intellectual awareness, left scanning, education   Pain   No c/o of pain  remain pain free or when pain occurs pain stays <4 with or without activity after pain medication  Q shift  and PRN pain assessment   Skin   No skin breakdown  remain free of skin breakdown  Q shift and PRN skin assessment    Rehab Goals Patient on target to meet rehab goals: Yes Rehab Goals Revised: Paitent on target for goals *See Care Plan and progress notes for long and short-term goals.     Barriers to Discharge  Current Status/Progress  Possible Resolutions Date Resolved   Nursing                  PT                    OT                  SLP                SW Lack of/limited family support;Behavior;Medical stability              Discharge Planning/Teaching Needs:  Goal is to discharge home  Will set uo education, if reccommended.   Team Discussion: MD BP up, meds adjusted, HF team following, Mg+ low, supplemented, repeat labs end of this week, need PVRs.  RN BS 128/2 units given, cont B/B, BM today.  OT transfers toilet/shower min A, LB D min, UB D min.  PT mod bed, mod pivot, gait 6' mod, 1 step max A, goals CGA overall.  SLP D2thins, mod A basic prob solving/attention, goals min A.   Revisions to Treatment Plan: N/A     Medical Summary Current Status: Altered mental status left hemiparesis secondary to right MCA infarction along with a small left CR and splenium infarct embolic pattern likely due to transient cardiomyopathy with low EF after cardiac catheterization for non-STEMI with stenting complicated by groin hematoma Weekly Focus/Goal: Improved mobility, cognition, BP, magnesium, urinary frequency  Barriers to Discharge: Medical stability;Behavior   Possible Resolutions to Barriers: Therapies, PVRs ordered, follow labs, optimize HTN meds   Continued Need for Acute Rehabilitation Level of Care: The patient requires daily medical management by a physician with specialized training in physical medicine and rehabilitation for the following reasons: Direction of a multidisciplinary physical rehabilitation program to maximize functional independence : Yes Medical management of patient stability for increased activity during participation in an intensive rehabilitation regime.: Yes Analysis of laboratory values and/or radiology reports with any subsequent need for medication adjustment and/or medical intervention. : Yes   I attest that I was present, lead the team conference, and concur with the assessment and  plan of the team.   Jodell Cipro M 10/30/2019, 10:07 PM   Team conference was held via web/ teleconference due to [place] - 19

## 2019-10-30 NOTE — Progress Notes (Signed)
Physical Therapy Session Note  Patient Details  Name: Brittney Kennedy MRN: [id number] Date of Birth: 1951-03-07  Today's Date: 10/30/2019 PT Individual Time: 1515-1600 PT Individual Time Calculation (min): 45 min   Short Term Goals: Week 2:  PT Short Term Goal 1 (Week 2): Supine to sit w/cga anc veral cues using bed features PT Short Term Goal 2 (Week 2): wc to/from bed w/min assist and cues w/LRAD PT Short Term Goal 3 (Week 2): Gait 66f w/ mod assist w/LRAD Week 3:     Skilled Therapeutic Interventions/Progress Updates:   Pt received supine in bed and agreeable to PT. Supine>sit transfer with min assist at trunak and cues safety and awareness of the LUE. [facility] G[facility] training with RW x 74fm+2085fith min assist overall and cues for step length on the L, improved heel contact, and step width. No knee instability noted throughout. [employer] dynamic balance training and visual scanning task Program A, 1 min x 4 bouts. 5, 9, 12 and 12 respectively with moderate cues for visual scanning to the L. Improved spontaneous scanning to the L with increased repetitions. Min assist to prevent lateral LOB to the L intermittently. Patient returned to room and [facility] in reach and all needs met.         Therapy Documentation Precautions:  Precautions Precautions: Fall, Other (comment) Precaution Comments: aspiration precautions (HOB >30 degrees), significant L inattention/neglect, signifcant L hemiparesis (UE>LE with L shoulder subluxation) Restrictions Weight Bearing Restrictions: No Vital Signs: Therapy Vitals Temp: 98.4 F (36.9 C) Pulse Rate: 74 Resp: 20 BP: (!) 154/66 Patient Position (if appropriate): Lying Oxygen Therapy SpO2: 98 % O2 Device: Room Air Pain: Pain Assessment Pain Scale: 0-10 Pain Score: 8  Pain Type: Acute pain Pain Location: Hip Pain Orientation: Right Pain  Descriptors / Indicators: Aching Pain Frequency: Intermittent Pain Onset: Gradual Patients Stated Pain Goal: 0 Pain Intervention(s): Medication (See eMAR);Repositioned    Therapy/Group: Individual Therapy  AusLorie Phenix28/2021, 4:05 PM

## 2019-10-30 NOTE — Progress Notes (Signed)
Social Work Patient ID: Brittney Kennedy, female   DOB: 09/30/1950, 69 y.o.   MRN: [id number]  SW called son to give team conference update, went to voicemail. Left message, will follow up tomorrow.

## 2019-10-30 NOTE — Progress Notes (Addendum)
[place] PHYSICAL MEDICINE & REHABILITATION PROGRESS NOTE  Subjective/Complaints: Patient seen sitting up in bed working morning, eating breakfast.  She states she slept well overnight with therapies, slow improvement.  Patient notes some improvement with Voltaren gel to knees.  ROS: Denies CP, shortness of breath, nausea, vomiting, diarrhea.  Objective: Vital Signs: Blood pressure (!) 162/79, pulse 65, temperature 97.8 F (36.6 C), resp. rate 18, height 5\' 2"  (1.575 m), weight 67.1 kg, SpO2 99 %. DG Swallowing Func-Speech Pathology  Result Date: 10/29/2019 Objective Swallowing Evaluation: Type of Study: MBS-Modified Barium Swallow Study  Patient Details Name: Brittney Kennedy MRN: [name] Date of Birth: 1950/11/09 Today's Date: 10/29/2019 Past Medical History: Past Medical History: Diagnosis Date . CAD (coronary artery disease)   a. 09/2019 NSTEMI/Cath: LM nl, LAD 90p, 68m, diffuse mid-dist dzs, LCX 23m AV groove/OM2, RI 90p, RCA dominant, nl, RPDA 51m, EF 25%, glob HK. 96m HFrEF (heart failure with reduced ejection fraction) (HCC)   a. 09/2019 LV gram: EF 25%, global HK. 10/2019 PUD (peptic ulcer disease)  . Seasonal allergies  Past Surgical History: Past Surgical History: Procedure Laterality Date . CHOLECYSTECTOMY   . CORONARY STENT INTERVENTION N/A 09/30/2019  Procedure: CORONARY STENT INTERVENTION;  Surgeon: 10/02/2019, Peter M, MD;  Location: [facility] INVASIVE CV LAB;  Service: Cardiovascular;  Laterality: N/A; . ESOPHAGOGASTRODUODENOSCOPY (EGD) WITH PROPOFOL N/A 10/08/2019  Procedure: ESOPHAGOGASTRODUODENOSCOPY (EGD) WITH PROPOFOL;  Surgeon: 12/08/2019, MD;  Location: [facility] ENDOSCOPY;  Service: Gastroenterology;  Laterality: N/A; . HEMOSTASIS CLIP PLACEMENT  10/08/2019  Procedure: HEMOSTASIS CLIP PLACEMENT;  Surgeon: 12/08/2019, MD;  Location: [facility] ENDOSCOPY;  Service: Gastroenterology;; . HEMOSTASIS CONTROL  10/08/2019  Procedure: HEMOSTASIS CONTROL;  Surgeon: 12/08/2019, MD;  Location: [facility]  ENDOSCOPY;  Service: Gastroenterology;; . LEFT HEART CATH AND CORONARY ANGIOGRAPHY N/A 09/30/2019  Procedure: LEFT HEART CATH AND CORONARY ANGIOGRAPHY;  Surgeon: 10/02/2019, Peter M, MD;  Location: [facility] INVASIVE CV LAB;  Service: Cardiovascular;  Laterality: N/A; . LEFT HEART CATH AND CORS/GRAFTS ANGIOGRAPHY N/A 09/27/2019  Procedure: LEFT HEART CATH AND CORS/GRAFTS ANGIOGRAPHY;  Surgeon: 09/29/2019, MD;  Location: [facility] INVASIVE CV LAB;  Service: Cardiovascular;  Laterality: N/A; HPI: 69 y/o female with hx of CAD, cholecystectomy, presented to ED with nausea/vomiting, epigastric pain for 1 week intermittently.  Admitted for NSTEMI, cath 3/26 revealed severe multivessel CAD and transferred to [facility].  Fall 3/27 with R groin hematoma.  Found AMS 3/28 due to pain meds.  Rapid response 3/29 due to AMS and L facial droop/weakness. CT revealed right basal ganglia infarct.  3/29 pm with worsening deficits; STAT CT head with right parietal infarct.  Assessment / Plan / Recommendation CHL IP CLINICAL IMPRESSIONS 10/29/2019 Clinical Impression --Pt presents with moderate oropharyngeal dysphagia most remarkable for deep penetration and trace aspiration (PAS scale 5 and 7) of thin barium without use of compensatory strategies due to incomplete laryngeal closure. Pt's swallow initiation is much more timely today in comparison to last MBSS, with delay to pyriform sinuses only observed on very first presentation of thin. She also demonstrated greatly improved ability to execute a left head turn prior to swallow, which prevented aspiration in 100% of trials of thin barium with use of this strategy. Recommend pt upgrade to thin liquids but must turn head to LEFT with every sip, use slow rate, small bite/sips, continue dysphagia 2 solids and pills in puree. Pt will require full supervision to ensure use of compensatory swallow strategies.  SLP Visit Diagnosis Dysphagia, oropharyngeal phase (R13.12) Attention  and concentration deficit  following -- Frontal lobe and executive function deficit following -- Impact on safety and function Mild aspiration risk   CHL IP TREATMENT RECOMMENDATION 10/18/2019 Treatment Recommendations Therapy as outlined in treatment plan below   Prognosis 10/05/2019 Prognosis for Safe Diet Advancement Good Barriers to Reach Goals Severity of deficits Barriers/Prognosis Comment -- CHL IP DIET RECOMMENDATION 10/29/2019 SLP Diet Recommendations Dysphagia 2 (Fine chop) solids;Thin liquid Liquid Administration via Cup Medication Administration Whole meds with puree Compensations Minimize environmental distractions;Slow rate;Small sips/bites Postural Changes Remain semi-upright after after feeds/meals (Comment);Seated upright at 90 degrees   CHL IP OTHER RECOMMENDATIONS 10/29/2019 Recommended Consults -- Oral Care Recommendations Oral care BID Other Recommendations --   CHL IP FOLLOW UP RECOMMENDATIONS 10/18/2019 Follow up Recommendations Inpatient Rehab   CHL IP FREQUENCY AND DURATION 10/05/2019 Speech Therapy Frequency (ACUTE ONLY) min 3x week Treatment Duration 2 weeks      CHL IP ORAL PHASE 10/29/2019 Oral Phase Impaired Oral - Pudding Teaspoon -- Oral - Pudding Cup -- Oral - Honey Teaspoon -- Oral - Honey Cup -- Oral - Nectar Teaspoon -- Oral - Nectar Cup -- Oral - Nectar Straw -- Oral - Thin Teaspoon -- Oral - Thin Cup Weak lingual manipulation;Lingual pumping;Premature spillage Oral - Thin Straw NT Oral - Puree NT Oral - Mech Soft NT Oral - Regular -- Oral - Multi-Consistency -- Oral - Pill NT Oral Phase - Comment --  CHL IP PHARYNGEAL PHASE 10/29/2019 Pharyngeal Phase Impaired Pharyngeal- Pudding Teaspoon -- Pharyngeal -- Pharyngeal- Pudding Cup -- Pharyngeal -- Pharyngeal- Honey Teaspoon NT Pharyngeal -- Pharyngeal- Honey Cup NT Pharyngeal -- Pharyngeal- Nectar Teaspoon NT Pharyngeal -- Pharyngeal- Nectar Cup NT Pharyngeal -- Pharyngeal- Nectar Straw NT Pharyngeal -- Pharyngeal- Thin Teaspoon NT Pharyngeal -- Pharyngeal- Thin Cup  Reduced airway/laryngeal closure;Penetration/Aspiration during swallow;Trace aspiration;Compensatory strategies attempted (with notebox) Pharyngeal Material enters airway, passes BELOW cords and not ejected out despite cough attempt by patient;Material enters airway, CONTACTS cords and not ejected out Pharyngeal- Thin Straw NT Pharyngeal -- Pharyngeal- Puree NT Pharyngeal -- Pharyngeal- Mechanical Soft NT Pharyngeal -- Pharyngeal- Regular -- Pharyngeal -- Pharyngeal- Multi-consistency -- Pharyngeal -- Pharyngeal- Pill NT Pharyngeal -- Pharyngeal Comment --  CHL IP CERVICAL ESOPHAGEAL PHASE 10/29/2019 Cervical Esophageal Phase WFL Pudding Teaspoon -- Pudding Cup -- Honey Teaspoon -- Honey Cup -- Nectar Teaspoon -- Nectar Cup -- Nectar Straw -- Thin Teaspoon -- Thin Cup -- Thin Straw -- Puree -- Mechanical Soft -- Regular -- Multi-consistency -- Pill -- Cervical Esophageal Comment -- [name] 10/29/2019, 12:56 PM              No results for input(s): WBC, HGB, HCT, PLT in the last 72 hours. Recent Labs    10/29/19 0603 10/30/19 0735  NA 139 141  K 3.8 4.0  CL 104 106  CO2 26 24  GLUCOSE 126* 133*  BUN 19 18  CREATININE 0.91 0.98  CALCIUM 10.1 10.0    Physical Exam: BP (!) 162/79 (BP Location: Right Arm)   Pulse 65   Temp 97.8 F (36.6 C)   Resp 18   Ht 5\' 2"  (1.575 m)   Wt 67.1 kg   SpO2 99%   BMI 27.06 kg/m  Constitutional: No distress . Vital signs reviewed. HENT: Normocephalic.  Atraumatic. Eyes: Tends to keep eyes closed.  No discharge. Cardiovascular: No JVD. Respiratory: Normal effort.  No stridor. GI: Non-distended. Skin: Warm and dry.  Intact. Psych: Normal mood.  Normal behavior. Musc: Easily distracted. Neuro:  Alert and oriented x4 Left inattention, improving Dysarthria, stable Facial weakness, unchanged Motor:  RUE/RLE: 5/5 proximal distal LUE: Shoulder abduction 3/5, distally 4/5 LLE: 4/5 proximal to distal (?  Some pain inhibition)  Assessment/Plan: 1.  Functional deficits secondary to bilateral embolic infarcts.  Which require 3+ hours per day of interdisciplinary therapy in a comprehensive inpatient rehab setting.  Physiatrist is providing close team supervision and 24 hour management of active medical problems listed below.  Physiatrist and rehab team continue to assess barriers to discharge/monitor patient progress toward functional and medical goals  Care Tool:  Bathing    Body parts bathed by patient: Chest, Abdomen, Front perineal area, Face, Left arm, Right upper leg, Left upper leg, Buttocks   Body parts bathed by helper: Right arm, Left lower leg, Right lower leg Body parts n/a: Right arm, Left arm   Bathing assist Assist Level: Moderate Assistance - Patient 50 - 74%     Upper Body Dressing/Undressing Upper body dressing   What is the patient wearing?: Pull over shirt    Upper body assist Assist Level: Minimal Assistance - Patient > 75%    Lower Body Dressing/Undressing Lower body dressing      What is the patient wearing?: Underwear/pull up, Pants     Lower body assist Assist for lower body dressing: Moderate Assistance - Patient 50 - 74%     Toileting Toileting    Toileting assist Assist for toileting: Moderate Assistance - Patient 50 - 74%     Transfers Chair/bed transfer  Transfers assist     Chair/bed transfer assist level: Moderate Assistance - Patient 50 - 74%     Locomotion Ambulation   Ambulation assist      Assist level: Moderate Assistance - Patient 50 - 74% Assistive device: Walker-rolling Max distance: 40ft   Walk 10 feet activity   Assist     Assist level: Moderate Assistance - Patient - 50 - 74% Assistive device: Walker-rolling   Walk 50 feet activity   Assist Walk 50 feet with 2 turns activity did not occur: Safety/medical concerns  Assist level: Moderate Assistance - Patient - 50 - 74% Assistive device: Walker-rolling    Walk 150 feet activity   Assist Walk  150 feet activity did not occur: Safety/medical concerns         Walk 10 feet on uneven surface  activity   Assist Walk 10 feet on uneven surfaces activity did not occur: Safety/medical concerns         Wheelchair     Assist Will patient use wheelchair at discharge?: (TBD)             Wheelchair 50 feet with 2 turns activity    Assist            Wheelchair 150 feet activity     Assist            Medical Problem List and Plan: 1.  Altered mental status left hemiparesis secondary to right MCA infarction along with a small left CR and splenium infarct embolic pattern likely due to transient cardiomyopathy with low EF after cardiac catheterization for non-STEMI with stenting complicated by groin hematoma  Continue CIR  Repeat CT unremarkable for changes  Team conference today to discuss current and goals and coordination of care, home and environmental barriers, and discharge planning with nursing, case manager, and therapies.  2.  Antithrombotics: -DVT/anticoagulation: SCDs             -antiplatelet therapy: Plavix  3. Pain Management: Tylenol as needed  Voltaren gel to bilateral knees ordered on 4/27, with some improvement 4. Mood: Amantadine 100 mg twice daily, DC'd secondary to hallucinations  Melatonin 3 mg nightly             -antipsychotic agents: N/A 5. Neuropsych: This patient is not capable of making decisions on her own behalf. 6. Skin/Wound Care: Routine skin checks 7. Fluids/Electrolytes/Nutrition: Routine in and outs.   BMP within acceptable range on 4/28 8.  GI bleed/duodenal ulcer.  Status post EGD 10/08/2019 with epi and hemostasis clips x5.  PPI twice daily             See #9 9.  Acute blood loss anemia.    Hemoglobin 11.0 on 4/24  Continue to monitor 10.  AKI.  Resolved.  11.  Acute urinary tract infection.  Patient completed 5-day course amoxicillin 10/17/2019 12.  Post stroke dysphagia.  Dysphagia #2 thins liquids.  Follow-up  speech therapy             Advance diet as tolerated 13.  Keratoconjunctivitis.  Follow-up ophthalmology services.  Completed 7-day course of erythromycin drops on 4/17. 14.  Hypertension.     Increased Coreg to 6.25 mg BID with meals  Discussed with pharmacy, started on Losartan 12.5 on 4/22, increased to 25 mg, increased to 50 on 4/28 with eventual plans for Entresto 15.  Hyperlipidemia.  Lipitor 16.  Prediabetes.  Hemoglobin A1c 5.9.  SSI CBG (last 3)  Recent Labs    10/29/19 1618 10/29/19 2051 10/30/19 0557  GLUCAP 132* 133* 128*   Borderline elevated on 4/28 17. Acute systolic CHF   Filed Weights   10/28/19 0313 10/29/19 0407 10/30/19 0509  Weight: 64.5 kg 65.5 kg 67.1 kg   Stable on 4/28 18. Hypomagnesemia  Mag 1.4 on 4/27  Supplemented IV on 4/19, again on 4/23, again on 4/27  Oral supplementation initiated  19.  Hypokalemia  Potassium 3.7 on 4/23 after supplementation 20. Mod/Severe Hypoalbuminemia  Supplement initiated 21.  Urinary frequency  PVRs ordered, awaiting  UA unremarkable, urine culture minimal growth   LOS: 14 days A FACE TO FACE EVALUATION WAS PERFORMED  Gianah Batt Lorie Phenix 10/30/2019, 9:14 AM

## 2019-10-30 NOTE — Progress Notes (Signed)
Occupational Therapy Session Note  Patient Details  Name: Brittney Kennedy MRN: [id number] Date of Birth: May 01, 1951  Today's Date: 10/30/2019 OT Individual Time: 1015-1100 OT Individual Time Calculation (min): 45 min    Short Term Goals: Week 1:  OT Short Term Goal 1 (Week 1): Pt will be able to use L hand as a stabilizing A with UB bathing. OT Short Term Goal 1 - Progress (Week 1): Progressing toward goal OT Short Term Goal 2 (Week 1): Pt will be able to find items on her food tray on L side with min cues. OT Short Term Goal 2 - Progress (Week 1): Met OT Short Term Goal 3 (Week 1): Pt will complete squat pivot to toilet with mod A. OT Short Term Goal 3 - Progress (Week 1): Met OT Short Term Goal 4 (Week 1): Pt will don a shirt with mod A. OT Short Term Goal 4 - Progress (Week 1): Met OT Short Term Goal 5 (Week 1): Pt will rise to stand with mod A. OT Short Term Goal 5 - Progress (Week 1): Met Week 2:  OT Short Term Goal 1 (Week 2): Pt will demonstrate improved attention and ability to follow directions to don shirt with verbal cues and set up. OT Short Term Goal 2 (Week 2): Pt will don pants over L foot with steadying A. OT Short Term Goal 3 (Week 2): Pt will use L hand as a stabilizing A when opening/closing containers. OT Short Term Goal 4 (Week 2): Pt will be able to lift L shoulder to 45 degrees abduction to be able to wash under arm pit easily.  Skilled Therapeutic Interventions/Progress Updates:    Pt seen this session for B/d at shower level. Pt was able to get out of bed with min A and rise to stand to RW with min A and stand pivot to w/c with RW with min A.  Pt then transferred into shower with grab bar with min A. She did very well with the shower washing all of her body with sit to stand to wash bottom, crossing legs to wash feet, and using L hand to wash 50% of R arm.   Mod cues and min A with hemidressing techniques. She can cross her legs to don pants and socks and she can  pull pants over hips with 50% A.    Pt tolerated therapy well with fewer complaints of fatigue.    Pt opted to rest in bed with all needs met. Bed alarm set.    Therapy Documentation Precautions:  Precautions Precautions: Fall, Other (comment) Precaution Comments: aspiration precautions (HOB >30 degrees), significant L inattention/neglect, signifcant L hemiparesis (UE>LE with L shoulder subluxation) Restrictions Weight Bearing Restrictions: No  Pain: Pain Assessment Pain Scale: 0-10 Pain Score: 7  Pain Type: Acute pain Pain Location: Hip Pain Orientation: Right Pain Descriptors / Indicators: Aching Pain Onset: Gradual Pain Intervention(s): RN made aware(applied voltaren gel) ADL: ADL Grooming: Supervision/safety Where Assessed-Grooming: Sitting at sink Upper Body Bathing: Minimal assistance Where Assessed-Upper Body Bathing: Shower Lower Body Bathing: Minimal assistance Where Assessed-Lower Body Bathing: Shower Upper Body Dressing: Minimal assistance Where Assessed-Upper Body Dressing: Wheelchair Lower Body Dressing: Moderate assistance Where Assessed-Lower Body Dressing: Wheelchair Toileting: Moderate assistance Where Assessed-Toileting: Glass blower/designer: Moderate assistance Toilet Transfer Method: Stand pivot Toilet Transfer Equipment: Grab bars, Raised toilet seat Social research officer, government: Minimal assistance Social research officer, government Method: Radiographer, therapeutic: Shower seat with back, Grab bars   Therapy/Group: Individual Therapy  [place] 10/30/2019, 10:51 AM

## 2019-10-31 ENCOUNTER — Inpatient Hospital Stay (HOSPITAL_COMMUNITY): Payer: Medicare Other | Admitting: Speech Pathology

## 2019-10-31 ENCOUNTER — Inpatient Hospital Stay (HOSPITAL_COMMUNITY): Payer: Medicare Other

## 2019-10-31 ENCOUNTER — Inpatient Hospital Stay (HOSPITAL_COMMUNITY): Payer: Medicare Other | Admitting: Occupational Therapy

## 2019-10-31 ENCOUNTER — Inpatient Hospital Stay (HOSPITAL_COMMUNITY): Payer: Medicare Other | Admitting: Physical Therapy

## 2019-10-31 DIAGNOSIS — N390 Urinary tract infection, site not specified: Secondary | ICD-10-CM

## 2019-10-31 LAB — GLUCOSE, CAPILLARY
Glucose-Capillary: 147 mg/dL — ABNORMAL HIGH (ref 70–99)
Glucose-Capillary: 165 mg/dL — ABNORMAL HIGH (ref 70–99)
Glucose-Capillary: 140 mg/dL — ABNORMAL HIGH (ref 70–99)
Glucose-Capillary: 207 mg/dL — ABNORMAL HIGH (ref 70–99)

## 2019-10-31 NOTE — Progress Notes (Signed)
Occupational Therapy Session Note  Patient Details  Name: Brittney Kennedy MRN: [id number] Date of Birth: August 26, 1950  Today's Date: 10/31/2019 OT Individual Time: 1345-1445 OT Individual Time Calculation (min): 60 min    Short Term Goals: Week 2:  OT Short Term Goal 1 (Week 2): Pt will demonstrate improved attention and ability to follow directions to don shirt with verbal cues and set up. OT Short Term Goal 2 (Week 2): Pt will don pants over L foot with steadying A. OT Short Term Goal 3 (Week 2): Pt will use L hand as a stabilizing A when opening/closing containers. OT Short Term Goal 4 (Week 2): Pt will be able to lift L shoulder to 45 degrees abduction to be able to wash under arm pit easily.  Skilled Therapeutic Interventions/Progress Updates:    Pt received in wc ready for therapy. Pt taken to gym and she completed stand pivot transfer to mat with min A.  On mat, worked on a/arom of LUE using [employer] and gym ball.  Focused on unilateral and then bilateral movement patterns. She has improved movement but continues to have great difficulty lifting arm against gravity.  Pt worked on Scientist, clinical (histocompatibility and immunogenetics) with Left hand with A to extend wrist but pt could actively use a tip pinch to grasp blocks.  She continues to need mod cues to turn head to left, attend to people and things in left environment and fully attend to task. She states her vision is so poor in L eye she doesn't even try to look to that side.   Overall pt participated fairly well. She was taken back to room and transferred to bed to rest until next therapy.  Bed alarm set and all needs met.  Therapy Documentation Precautions:  Precautions Precautions: Fall, Other (comment) Precaution Comments: aspiration precautions (HOB >30 degrees), significant L inattention/neglect, signifcant L hemiparesis (UE>LE with L shoulder subluxation) Restrictions Weight Bearing Restrictions: No  Pain: Pain Assessment Pain Scale: 0-10 Pain  Score: 7  Pain Type: Chronic pain;Acute pain Pain Location: Hip Pain Orientation: Right Pain Descriptors / Indicators: Aching;Discomfort Pain Frequency: Intermittent Pain Onset: Gradual Patients Stated Pain Goal: 2 Pain Intervention(s): Medication (See eMAR);Repositioned Multiple Pain Sites: No ADL: ADL Grooming: Supervision/safety Where Assessed-Grooming: Sitting at sink Upper Body Bathing: Minimal assistance Where Assessed-Upper Body Bathing: Shower Lower Body Bathing: Minimal assistance Where Assessed-Lower Body Bathing: Shower Upper Body Dressing: Minimal assistance Where Assessed-Upper Body Dressing: Wheelchair Lower Body Dressing: Moderate assistance Where Assessed-Lower Body Dressing: Wheelchair Toileting: Moderate assistance Where Assessed-Toileting: Glass blower/designer: Moderate assistance Toilet Transfer Method: Stand pivot Toilet Transfer Equipment: Grab bars, Raised toilet seat Social research officer, government: Minimal assistance Social research officer, government Method: Radiographer, therapeutic: Shower seat with back, Grab bars      Therapy/Group: Individual Therapy  [place] 10/31/2019, 10:45 AM

## 2019-10-31 NOTE — Plan of Care (Signed)
  Problem: Consults Goal: RH STROKE PATIENT EDUCATION Description: See Patient Education module for education specifics  Outcome: Progressing Goal: Nutrition Consult-if indicated Outcome: Progressing   Problem: RH BOWEL ELIMINATION Goal: RH STG MANAGE BOWEL WITH ASSISTANCE Description: STG Manage Bowel with mod I Assistance. Outcome: Progressing Goal: RH STG MANAGE BOWEL W/MEDICATION W/ASSISTANCE Description: STG Manage Bowel with Medication with mod I Assistance. Outcome: Progressing   Problem: RH BLADDER ELIMINATION Goal: RH STG MANAGE BLADDER WITH ASSISTANCE Description: STG Manage Bladder With mod I Assistance Outcome: Progressing   Problem: RH SKIN INTEGRITY Goal: RH STG SKIN FREE OF INFECTION/BREAKDOWN Description: Patient will not have new areas of skin break down  Outcome: Progressing Goal: RH STG MAINTAIN SKIN INTEGRITY WITH ASSISTANCE Description: STG Maintain Skin Integrity With mod I Assistance. Outcome: Progressing   Problem: RH SAFETY Goal: RH STG ADHERE TO SAFETY PRECAUTIONS W/ASSISTANCE/DEVICE Description: STG Adhere to Safety Precautions With Assistance/Device. Outcome: Progressing   Problem: RH COGNITION-NURSING Goal: RH STG USES MEMORY AIDS/STRATEGIES W/ASSIST TO PROBLEM SOLVE Description: STG Uses Memory Aids/Strategies With Assistance to Problem Solve. Outcome: Progressing Goal: RH STG ANTICIPATES NEEDS/CALLS FOR ASSIST W/ASSIST/CUES Description: STG Anticipates Needs/Calls for Assist With Assistance/Cues. Outcome: Progressing   Problem: RH PAIN MANAGEMENT Goal: RH STG PAIN MANAGED AT OR BELOW PT'S PAIN GOAL Description: Pain scale <4/10 Outcome: Progressing   Problem: RH KNOWLEDGE DEFICIT Goal: RH STG INCREASE KNOWLEDGE OF HYPERTENSION Description: Patient will maintain SBP less than 150 Outcome: Progressing Goal: RH STG INCREASE KNOWLEDGE OF DYSPHAGIA/FLUID INTAKE Description: Patient will tolerate full diet with thin liquids Outcome:  Progressing Goal: RH STG INCREASE KNOWLEDGE OF STROKE PROPHYLAXIS Description: Patient will adhere to medication regimen and dietary modifications Outcome: Progressing   Problem: Consults Goal: RH GENERAL PATIENT EDUCATION Description: See Patient Education module for education specifics. Outcome: Progressing

## 2019-10-31 NOTE — Plan of Care (Signed)
  Problem: RH Balance Goal: LTG Patient will maintain dynamic sitting balance (PT) Description: LTG:  Patient will maintain dynamic sitting balance with assistance during mobility activities (PT) Flowsheets (Taken 10/31/2019 1757) LTG: Pt will maintain dynamic sitting balance during mobility activities with:: (upgraded based on pt progress) Supervision/Verbal cueing Note: upgraded based on pt progress   Problem: Sit to Stand Goal: LTG:  Patient will perform sit to stand with assistance level (PT) Description: LTG:  Patient will perform sit to stand with assistance level (PT) Flowsheets (Taken 10/31/2019 1757) LTG: PT will perform sit to stand in preparation for functional mobility with assistance level: (upgraded based on pt progress) Supervision/Verbal cueing Note: upgraded based on pt progress   Problem: RH Bed Mobility Goal: LTG Patient will perform bed mobility with assist (PT) Description: LTG: Patient will perform bed mobility with assistance, with/without cues (PT). Flowsheets (Taken 10/31/2019 1757) LTG: Pt will perform bed mobility with assistance level of: (upgraded based on pt progress) Supervision/Verbal cueing Note: upgraded based on pt progress   Problem: RH Bed to Chair Transfers Goal: LTG Patient will perform bed/chair transfers w/assist (PT) Description: LTG: Patient will perform bed to chair transfers with assistance (PT). Flowsheets (Taken 10/31/2019 1757) LTG: Pt will perform Bed to Chair Transfers with assistance level: (upgraded based on pt progress) Supervision/Verbal cueing Note: upgraded based on pt progress   Problem: RH Car Transfers Goal: LTG Patient will perform car transfers with assist (PT) Description: LTG: Patient will perform car transfers with assistance (PT). Flowsheets (Taken 10/31/2019 1757) LTG: Pt will perform car transfers with assist:: (upgraded based on pt progress) Contact Guard/Touching assist Note: upgraded based on pt progress   Problem: RH  Ambulation Goal: LTG Patient will ambulate in controlled environment (PT) Description: LTG: Patient will ambulate in a controlled environment, # of feet with assistance (PT). Flowsheets (Taken 10/31/2019 1757) LTG: Pt will ambulate in controlled environ  assist needed:: (upgraded based on pt progress) Contact Guard/Touching assist LTG: Ambulation distance in controlled environment: 178ft using LRAD Note: upgraded based on pt progress Goal: LTG Patient will ambulate in home environment (PT) Description: LTG: Patient will ambulate in home environment, # of feet with assistance (PT). Flowsheets (Taken 10/31/2019 1757) LTG: Pt will ambulate in home environ  assist needed:: (upgraded based on pt progress) Supervision/Verbal cueing LTG: Ambulation distance in home environment: 78ft using LRAD Note: upgraded based on pt progress   Problem: RH Stairs Goal: LTG Patient will ambulate up and down stairs w/assist (PT) Description: LTG: Patient will ambulate up and down # of stairs with assistance (PT) Flowsheets (Taken 10/31/2019 1757) LTG: Pt will ambulate up/down stairs assist needed:: (upgraded based on pt progress) Contact Guard/Touching assist LTG: Pt will  ambulate up and down number of stairs: 1step using LRAD Note: upgraded based on pt progress

## 2019-10-31 NOTE — Progress Notes (Signed)
Physical Therapy Session Note  Patient Details  Name: Brittney Kennedy MRN: [id number] Date of Birth: 08-09-1950  Today's Date: 10/31/2019 PT Individual Time: 0800-0900 PT Individual Time Calculation (min): 60 min   Short Term Goals: Week 1:  PT Short Term Goal 1 (Week 1): Pt will perform supine<>sit with max assist of 1 PT Short Term Goal 2 (Week 1): Pt will perform sit<>stands with max A of 1 PT Short Term Goal 3 (Week 1): Pt will perform bed<>chair transfers with max assist of 1 Week 2:  PT Short Term Goal 1 (Week 2): Supine to sit w/cga anc veral cues using bed features PT Short Term Goal 2 (Week 2): wc to/from bed w/min assist and cues w/LRAD PT Short Term Goal 3 (Week 2): Gait 64ft w/ mod assist w/LRAD Week 3:     Skilled Therapeutic Interventions/Progress Updates:    PAIN 9/10 R hip, nursing notified pt requesting meds, rest as needed  Pt initially sleeping and slow to awaken.  Initially resistant to therapy but relatively easy to persuade.  Pt lifts feet for therapist to donn socks.  Nursing in to perform bladder scan.  Pt supine to sit w/mod assist and cues to attend to L, scoots in sitting w/mod assist.  STS w/mod to min assist from low bed, cues to fully extend L knee.  Stood x 1 min w/cues for midline/keeping eyes closed due to "tired". SPT to wc w/max cues, min assist. Pt max assist to comb and braid hair. Pt transported to gym for continue session.  Maintains closed eyes.   Pt requested to wash face, provided w/washcloth and perfoms w/set up assist. STS w/min assist and cues for hand placement. Gait 48ft w/RW w/min assist to manage walker, cues to increase step length and clearance of LLE, pt c/o R hip pain 9/10 w/gait. Nursing notified pt requesting pain meds.  Pt requesting ice chips.  STS from wc to hi/lo table.  Cup placed to far L and pt cued to visually scan and locate cup.  Uses R hand/spoon /crossing midline to retrieve chips.  Standing at table pt scans/locates  checkers placed to L and w/hand/over hand assist using L hand grasps checker and attempts to place on board placed to L.  Pt then requested immediate use of BR for BM.  Propels wc backwards using both feet w/max cues to use L equally x 1ft, 19ft.  Transported to room for use of BR.  Pt cued to  hold feet up w/frequent cues to reposition LLE which quickly lowers due to inattention (during transport to room)..  STS from wc to walker w/cues for hand plcmt, min to mod assist.  Gait 78ft to commode w/RW min assist, turns/sits to commode w/max cues for safety/completion of backing/hand placment.  Pt voids but no BM.  Performs hygiene w/cga for balance on commode.  STS w/min assist Gait 28ft to wc/turns/sits w/max cues for safety.  Pt left oob in wc w/alarm belt set and needs in reach.  NT notified that pt ready for breakfast/tray in room/need for assist.   l inattention continues to be greatest limiting factor to safety w/mobility.  Therapy Documentation Precautions:  Precautions Precautions: Fall, Other (comment) Precaution Comments: aspiration precautions (HOB >30 degrees), significant L inattention/neglect, signifcant L hemiparesis (UE>LE with L shoulder subluxation) Restrictions Weight Bearing Restrictions: No    Therapy/Group: Individual Therapy  Brittney Kennedy, PT   Brittney Kennedy 10/31/2019, 12:42 PM

## 2019-10-31 NOTE — Progress Notes (Signed)
[facility] PHYSICAL MEDICINE & REHABILITATION PROGRESS NOTE  Subjective/Complaints: Patient seen laying in bed this morning.  She states she slept well overnight.  She remains sleepy this morning.  ROS: Denies CP, shortness of breath, nausea, vomiting, diarrhea.  Objective: Vital Signs: Blood pressure (!) 161/73, pulse 77, temperature 97.8 F (36.6 C), resp. rate 17, height 5\' 2"  (1.575 m), weight 68 kg, SpO2 98 %. No results found. No results for input(s): WBC, HGB, HCT, PLT in the last 72 hours. Recent Labs    10/29/19 0603 10/30/19 0735  NA 139 141  K 3.8 4.0  CL 104 106  CO2 26 24  GLUCOSE 126* 133*  BUN 19 18  CREATININE 0.91 0.98  CALCIUM 10.1 10.0    Physical Exam: BP (!) 161/73 (BP Location: Left Arm)   Pulse 77   Temp 97.8 F (36.6 C)   Resp 17   Ht 5\' 2"  (1.575 m)   Wt 68 kg   SpO2 98%   BMI 27.42 kg/m  Constitutional: No distress . Vital signs reviewed. HENT: Normocephalic.  Atraumatic. Eyes: Keeps eyes closed. No discharge. Cardiovascular: No JVD. Respiratory: Normal effort.  No stridor. GI: Non-distended. Skin: Warm and dry.  Intact. Psych: Withdrawn Musc: No edema in extremities.  No tenderness in extremities. Neuro: Alert Left inattention, improving Dysarthria, stable Facial weakness, unchanged Motor:  RUE/RLE: 5/5 proximal distal LUE: Shoulder abduction 3/5, distally 4/5 LLE: 4/5 proximal to distal (?  Some pain inhibition)  Assessment/Plan: 1. Functional deficits secondary to bilateral embolic infarcts.  Which require 3+ hours per day of interdisciplinary therapy in a comprehensive inpatient rehab setting.  Physiatrist is providing close team supervision and 24 hour management of active medical problems listed below.  Physiatrist and rehab team continue to assess barriers to discharge/monitor patient progress toward functional and medical goals  Care Tool:  Bathing    Body parts bathed by patient: Face, Chest, Abdomen, Front  perineal area, Buttocks, Right upper leg, Left upper leg, Right lower leg, Left lower leg, Left arm   Body parts bathed by helper: Right arm Body parts n/a: Right arm, Left arm   Bathing assist Assist Level: Minimal Assistance - Patient > 75%     Upper Body Dressing/Undressing Upper body dressing   What is the patient wearing?: Pull over shirt    Upper body assist Assist Level: Minimal Assistance - Patient > 75%    Lower Body Dressing/Undressing Lower body dressing      What is the patient wearing?: Pants     Lower body assist Assist for lower body dressing: Minimal Assistance - Patient > 75%     Toileting Toileting    Toileting assist Assist for toileting: Moderate Assistance - Patient 50 - 74%     Transfers Chair/bed transfer  Transfers assist     Chair/bed transfer assist level: Minimal Assistance - Patient > 75%     Locomotion Ambulation   Ambulation assist      Assist level: Moderate Assistance - Patient 50 - 74% Assistive device: Walker-rolling Max distance: 74ft   Walk 10 feet activity   Assist     Assist level: Moderate Assistance - Patient - 50 - 74% Assistive device: Walker-rolling   Walk 50 feet activity   Assist Walk 50 feet with 2 turns activity did not occur: Safety/medical concerns  Assist level: Moderate Assistance - Patient - 50 - 74% Assistive device: Walker-rolling    Walk 150 feet activity   Assist Walk 150 feet activity did  not occur: Safety/medical concerns         Walk 10 feet on uneven surface  activity   Assist Walk 10 feet on uneven surfaces activity did not occur: Safety/medical concerns         Wheelchair     Assist Will patient use wheelchair at discharge?: (TBD)             Wheelchair 50 feet with 2 turns activity    Assist            Wheelchair 150 feet activity     Assist            Medical Problem List and Plan: 1.  Altered mental status left hemiparesis  secondary to right MCA infarction along with a small left CR and splenium infarct embolic pattern likely due to transient cardiomyopathy with low EF after cardiac catheterization for non-STEMI with stenting complicated by groin hematoma  Continue CIR  Repeat CT unremarkable for changes 2.  Antithrombotics: -DVT/anticoagulation: SCDs             -antiplatelet therapy: Plavix 3. Pain Management: Tylenol as needed  Voltaren gel to bilateral knees ordered on 4/27, with some improvement 4. Mood: Amantadine 100 mg twice daily, DC'd secondary to hallucinations  Melatonin 3 mg nightly             -antipsychotic agents: N/A 5. Neuropsych: This patient is not capable of making decisions on her own behalf. 6. Skin/Wound Care: Routine skin checks 7. Fluids/Electrolytes/Nutrition: Routine in and outs.   BMP within acceptable range on 4/28 8.  GI bleed/duodenal ulcer.  Status post EGD 10/08/2019 with epi and hemostasis clips x5.  PPI twice daily             See #9 9.  Acute blood loss anemia.    Hemoglobin 11.0 on 4/24  Continue to monitor 10.  AKI.  Resolved.  11.  Acute urinary tract infection.  Patient completed 5-day course amoxicillin 10/17/2019 12.  Post stroke dysphagia.  Dysphagia #2 thins liquids.  Follow-up speech therapy             Advance diet as tolerated  Plan to transition to current modified diet after ability to consume regular textures 13.  Keratoconjunctivitis.  Follow-up ophthalmology services.  Completed 7-day course of erythromycin drops on 4/17. 14.  Hypertension.     Increased Coreg to 6.25 mg BID with meals  Discussed with pharmacy, started on Losartan 12.5 on 4/22, increased to 25 mg, increased to 50 on 4/28 with eventual plans for Entresto  Remains elevated on 4/29 15.  Hyperlipidemia.  Lipitor 16.  Prediabetes.  Hemoglobin A1c 5.9.  SSI CBG (last 3)  Recent Labs    10/30/19 1635 10/30/19 2045 10/31/19 0532  GLUCAP 152* 159* 147*   Slightly labile on 4/29 17. Acute  systolic CHF   Filed Weights   10/30/19 0509 10/30/19 1612 10/31/19 0500  Weight: 67.1 kg 67.1 kg 68 kg   Stable on 4/28 18. Hypomagnesemia  Mag 1.4 on 4/27, labs ordered for tomorrow  Supplemented IV on 4/19, again on 4/23, again on 4/27  Oral supplementation initiated  19.  Hypokalemia  Potassium 3.7 on 4/23 after supplementation 20. Mod/Severe Hypoalbuminemia  Supplement initiated 21.  Urinary frequency  PVRs ordered, only performed x1 with minimal residual  UA unremarkable, urine culture minimal growth   Keflex, discussed with pharmacy antibiotics induration  Will likely start medications after completing course of antibiotics  LOS: 15 days A  FACE TO FACE EVALUATION WAS PERFORMED  Brittney Kennedy Brittney Kennedy 10/31/2019, 11:42 AM

## 2019-10-31 NOTE — Progress Notes (Signed)
Social Work Patient ID: Brittney Kennedy, female   DOB: April 21, 1951, 69 y.o.   MRN: [id number]  SW received call back form son, received team conference update. Informed him of MD, nursing and therapy updates. Informed him of set discharge of May 13th. Stated "they changed it" SW informed him the decision was 13th. Will follow up with any questions/concerns.

## 2019-10-31 NOTE — Progress Notes (Signed)
Speech Language Pathology Weekly Progress and Session Note  Patient Details  Name: Brittney Kennedy MRN: [id number] Date of Birth: 1950/12/15  Beginning of progress report period: October 24, 2019 End of progress report period: October 31, 2019  Today's Date: 10/31/2019 SLP Individual Time: 1001-1045 SLP Individual Time Calculation (min): 44 min  Short Term Goals: Week 2: SLP Short Term Goal 1 (Week 2): Pt will sustain attention to basic familiar task for ~ 5 minutes with Min A cues. SLP Short Term Goal 1 - Progress (Week 2): Progressing toward goal SLP Short Term Goal 2 (Week 2): With Mod A cues, pt will utilize speech intelligibilty strategies to increase intelligibility to ~ 85% at sentence level. SLP Short Term Goal 2 - Progress (Week 2): Met SLP Short Term Goal 3 (Week 2): Pt will consume least restrictive diet with efficient mastication and oral clearance with Supervision A cues for swallow strategies. SLP Short Term Goal 3 - Progress (Week 2): Progressing toward goal SLP Short Term Goal 4 (Week 2): Pt will demonstrate readiness for repeat MBSS by consuming trials of thin via water protocol with minimal overt s/sx aspiration over 2 more consecutive sessions. SLP Short Term Goal 4 - Progress (Week 2): Met    New Short Term Goals: Week 3: SLP Short Term Goal 1 (Week 3): Pt will sustain attention to basic familiar task for ~ 5 minutes with Min A cues. SLP Short Term Goal 2 (Week 3): Provided Min A cues, pt will utilize speech intelligibilty strategies to achieve 90% intelligibility at sentence level. SLP Short Term Goal 3 (Week 3): Pt will demonstrate ability to problem solve in functional basic situations with Mod A mutimodal cues. SLP Short Term Goal 4 (Week 3): Pt will recall daily and/or new information with Mod A cues for use of compensatory memory strategies. SLP Short Term Goal 5 (Week 3): Pt will demonstrate efficient mastication and oral clearance with Min A cues during upgraded  trials of Dysphaiga 3 solids prior to upgrade. SLP Short Term Goal 6 (Week 3): Pt will demonstrate ability to use her compensatory maneuver with thin liquids (left head turn) with Min A verbal cues.  Weekly Progress Updates: Pt is making steady progress, making functional gains and met 2 out of 4 short term goals this reporting period. Pt is currently Min-Mod assist for basic cognitive tasks due to impairments impacting her short term memory, problem solving,sustained attention, and emergent awareness, as well as dysphagia and dysarthria. Pt is consuming dysphagia 2 (minced/ground) diet with thin liquids (upgraded after MBSS 10/29/19) with use of a left head turn as compensatory maneuver to minimize aspiration risk. Pt is using strategies for oral clearance with more independence than newly implemented left head turn with thins, however she is demonstrating good ability to respond to fluctuating Min-Mod cues to implement it accurately. Her speech is ~80-85% intelligible at the sentence level, requiring Min-Mod cues for use of strategies. Pt and family education is ongoing. Pt would continue to benefit from skilled ST while inpatient in order to maximize functional independence and reduce burden of care prior to discharge. Anticipate that pt will need 24/7 supervision at discharge in addition to [place] follow up at next level of care.       Intensity: Minumum of 1-2 x/day, 30 to 90 minutes Frequency: 3 to 5 out of 7 days Duration/Length of Stay: 11/14/19 Treatment/Interventions: Functional tasks;Cognitive remediation/compensation;Patient/family education;Dysphagia/aspiration precaution training;Cueing hierarchy;Internal/external aids;Speech/Language facilitation   Daily Session  Skilled Therapeutic Interventions: Pt was seen for  skilled ST targeting dysphagia and cognitive goals. Pt was lethargic today, as she reported a restless night. Therefore, she required extra encouragement to participate as well as  rest breaks. She verbally recalled her compensatory swallow maneuver (turn head over left shoulder) with only a question cue. She then consumed sips of thin H2O with immediate cough in 1 out of 10 trials. She did execute left head turn with only initial set up verbal cues, not further cues required after verbal review of strategy. She politely declined advanced solid trials. Recommend continue current diet.  Pt required Max A for visual scanning, problem solving, and working memory to complete the trail making task started in previous session. Suspect decreased performance today is secondary to degree of lethargy. Mod A question cues also required for pt to recall details of last ST session. Pt became increasingly lethargic throughout session and repeatedly requested to rest. Her lethargy reached a point she could no longer appropriately attend to tasks, therefore session ended 15 mins early. Pt left laying in bed with alarm set and needs met. Continue per current plan of care.          Pain Pain Assessment Pain Scale: Faces Faces Pain Scale: No hurt   Therapy/Group: Individual Therapy  Arbutus Leas 10/31/2019, 7:12 AM

## 2019-10-31 NOTE — Progress Notes (Signed)
Physical Therapy Weekly Progress Note  Patient Details  Name: Brittney Kennedy MRN: [id number] Date of Birth: [birth year]  Beginning of progress report period: October 24, 2019 End of progress report period: October 31, 2019  Today's Date: 10/31/2019 PT Individual Time: 4580-9983 PT Individual Time Calculation (min): 62 min   Patient has met 2 of 3 short term goals.  Brittney Kennedy is progressing well with therapy demonstrating increasing independence with functional mobility. She is performing supine<>sit with min assist, sit<>stands with min assist, stand pivot transfers no AD with min assist, and ambulating up to 21f using RW with min/mod assist. She continues to demonstrate significant L inattention with R gaze preference, L hemiparesis (UE more impaired than LE), and impaired safety awareness.   Patient continues to demonstrate the following deficits muscle weakness, decreased cardiorespiratoy endurance, impaired timing and sequencing, abnormal tone, unbalanced muscle activation, motor apraxia, decreased coordination and decreased motor planning, decreased visual acuity, decreased visual perceptual skills and decreased visual motor skills, decreased attention to left, decreased attention, decreased awareness, decreased problem solving, decreased safety awareness, decreased memory and delayed processing and decreased sitting balance, decreased standing balance, decreased postural control and decreased balance strategies and therefore will continue to benefit from skilled PT intervention to increase functional independence with mobility.  Patient progressing toward long term goals..  Plan of care revisions: LTGs upgraded based on pt's progress.  PT Short Term Goals Week 2:  PT Short Term Goal 1 (Week 2): Supine to sit w/cga anc veral cues using bed features PT Short Term Goal 1 - Progress (Week 2): Progressing toward goal PT Short Term Goal 2 (Week 2): wc to/from bed w/min assist and cues w/LRAD PT  Short Term Goal 2 - Progress (Week 2): Met PT Short Term Goal 3 (Week 2): Gait 726fw/ mod assist w/LRAD PT Short Term Goal 3 - Progress (Week 2): Met Week 3:  PT Short Term Goal 1 (Week 3): Pt will perform supine<>sit with CGA PT Short Term Goal 2 (Week 3): Pt will perform sit<>stand using LRAD with CGA PT Short Term Goal 3 (Week 3): Pt will perform bed<>chair transfers using LRAD with CGA PT Short Term Goal 4 (Week 3): Pt will ambulate at least 10081fsing LRAD with min assist of 1  Skilled Therapeutic Interventions/Progress Updates:  Ambulation/gait training;Community reintegration;DME/adaptive equipment instruction;Neuromuscular re-education;Psychosocial support;UE/LE Strength taining/ROM;Stair training;Wheelchair propulsion/positioning;Balance/vestibular training;Discharge planning;Functional electrical stimulation;Pain management;Skin care/wound management;Therapeutic Activities;UE/LE Coordination activities;Cognitive remediation/compensation;Disease management/prevention;Functional mobility training;Patient/family education;Splinting/orthotics;Therapeutic Exercise;Visual/perceptual remediation/compensation   Pt received supine in bed and agreeable to therapy session. Pt's son, DouMarden Noblend daughter-in-law, Brittney Spillersresent and educated on pt's progress with therapy. Supine>sitting R EOB with min assist for trunk upright and increased time with cuing for sequencing. L stand pivot to w/c with min assist for balance and manual facilitation for weight shifting to increase L step length  Transported to/from gym in w/c for time management and energy conservation. Performed all L stand pivot transfers during session targeting L attention with min assist for balance and manual facilitation for R lateral weight shifting to increase L step length. Sit<>stands using R hand on L knee to increase L lateral weight shifting with min assist for balance and cuing for increased L hip/knee extension to achieve upright.  Standing without UE support performed dynamic balance and L UE NMR task of grasping horseshoe with L hand then transitioning to R hand to place on top of mirror to facilitate increased L hip/knee extension all with min/mod assist for balance due to L  lateral lean. Pt reports need to use bathroom.  Transported back to room in w/c. Gait ~33f x2 to/from bathroom using RW with min assist, light mod assist when turning to sit in w/c or on toilet and navigating over bathroom door threshold, with cuing for sequencing of AD and increased L LE step length and hip abduction during swing. Sit<>stand RW<>toilet with min assist for lifting/lowering from low seat height. Continent of bladder and performed seated peri-care with facilitation for increased use of L hand to grasp toilet paper. Standing hand hygiene at sink with max multimodal cuing for proper AD management at sink and placement of LEs to facilitate improved balance. Transported back to gym in w/c. Completed dynamic standing balance task with horseshoes. L LE NMR targeting improved stance control, without UE support, via stepping R LE forward on/off 4" step progressed to stepping laterally on/off step with mod assist for balance due to L lateral lean - mirror feedback and max multimodal cuing to increase L glute, hip abductor, and quad muscle activation. Transported back to room and left seated in w/c with seat belt alarm on, needs in reach, RN and family present.  Therapy Documentation Precautions:  Precautions Precautions: Fall, Other (comment) Precaution Comments: aspiration precautions (HOB >30 degrees), significant L inattention/neglect, signifcant L hemiparesis (UE>LE with L shoulder subluxation) Restrictions Weight Bearing Restrictions: No  Pain:   Reports R hip pain during prolonged standing activities - provided seated rest breaks and targeted L LE stance to decrease R LE weightbearing - RN notified at end of session for medication  administration.  Therapy/Group: Individual Therapy  CTawana Scale PT, DPT 10/31/2019, 7:55 AM

## 2019-10-31 NOTE — Progress Notes (Signed)
Social Work Patient ID: Brittney Kennedy, female   DOB: 23-Dec-1950, 69 y.o.   MRN: [id number]  SW attempted to reach out to son again to report team conference updates. No answer, left voicemail. SW will continue to attempt.

## 2019-11-01 ENCOUNTER — Inpatient Hospital Stay (HOSPITAL_COMMUNITY): Payer: Medicare Other | Admitting: Physical Therapy

## 2019-11-01 ENCOUNTER — Inpatient Hospital Stay (HOSPITAL_COMMUNITY): Payer: Medicare Other | Admitting: Speech Pathology

## 2019-11-01 ENCOUNTER — Inpatient Hospital Stay (HOSPITAL_COMMUNITY): Payer: Medicare Other | Admitting: Occupational Therapy

## 2019-11-01 LAB — GLUCOSE, CAPILLARY
Glucose-Capillary: 147 mg/dL — ABNORMAL HIGH (ref 70–99)
Glucose-Capillary: 132 mg/dL — ABNORMAL HIGH (ref 70–99)
Glucose-Capillary: 149 mg/dL — ABNORMAL HIGH (ref 70–99)
Glucose-Capillary: 194 mg/dL — ABNORMAL HIGH (ref 70–99)

## 2019-11-01 LAB — MAGNESIUM: Magnesium: 1.6 mg/dL — ABNORMAL LOW (ref 1.7–2.4)

## 2019-11-01 MED ORDER — MAGNESIUM SULFATE 4 GM/100ML IV SOLN
4.0000 g | Freq: Once | INTRAVENOUS | Status: AC
  Administered 2019-11-01: 13:00:00 4 g via INTRAVENOUS
  Filled 2019-11-01: qty 100

## 2019-11-01 MED ORDER — DARIFENACIN HYDROBROMIDE ER 7.5 MG PO TB24
7.5000 mg | ORAL_TABLET | Freq: Every day | ORAL | Status: DC
  Administered 2019-11-01 – 2019-11-02 (×2): 7.5 mg via ORAL
  Filled 2019-11-01 (×2): qty 1

## 2019-11-01 MED ORDER — MAGNESIUM GLUCONATE 500 MG PO TABS
500.0000 mg | ORAL_TABLET | Freq: Two times a day (BID) | ORAL | Status: DC
  Administered 2019-11-01 – 2019-11-06 (×9): 500 mg via ORAL
  Filled 2019-11-01 (×11): qty 1

## 2019-11-01 NOTE — Progress Notes (Signed)
Physical Therapy Session Note  Patient Details  Name: Brittney Kennedy MRN: [id number] Date of Birth: February 13, 1951  Today's Date: 11/01/2019 PT Individual Time: 0810-0919 PT Individual Time Calculation (min): 69 min   Short Term Goals: Week 2:  PT Short Term Goal 1 (Week 2): Supine to sit w/cga anc veral cues using bed features PT Short Term Goal 1 - Progress (Week 2): Progressing toward goal PT Short Term Goal 2 (Week 2): wc to/from bed w/min assist and cues w/LRAD PT Short Term Goal 2 - Progress (Week 2): Met PT Short Term Goal 3 (Week 2): Gait 17f w/ mod assist w/LRAD PT Short Term Goal 3 - Progress (Week 2): Met  Skilled Therapeutic Interventions/Progress Updates: Pt presents sitting up in bed eating breakfast.  Pt encouraged to perform transfer OOB to w/c to continue eating.  Pt required min A and verbal cues for attention to left UE.  Pt donned right slipper sock in bed but able to sit and don sock w/ right UE w/ figure-4 position at EOB w/o LOB.  Pt performed sit to stand and SPT w/ min A and left foot clearance.  Pt continued to eat breakfast leaning to reach forward to table for food and to get drinks.  Pt required approx. 30% verbal cues for left head rotation for swallowing.  Pt states needing to use BR.  Pt amb to BR w/ RW and min A, w/ manual assist for maintaining left hand on RW.  Pt c/o pain right hip.  Pt required min A for managing clothing and mod A to clean peri area.  Pt stood at sink to wash hands and brush teeth.  Pt required hand over hand to maintain WB to left hand.    Pt performed LE there ex to increase strength, including calf raises w/ man resistance, LAQ and hip flexion 2-3 x 10.  Pt remained in w/c w/ chair alarm on and all needs in reach.     Therapy Documentation Precautions:  Precautions Precautions: Fall, Other (comment) Precaution Comments: aspiration precautions (HOB >30 degrees), significant L inattention/neglect, signifcant L hemiparesis (UE>LE with L  shoulder subluxation) Restrictions Weight Bearing Restrictions: No General:   Vital Signs:  Pain: 9/10 pain right hip w/ weight-bearing.  Pt will request pain meds. Pain Assessment Pain Scale: 0-10 Pain Score: 0-No pain Mobility:     Therapy/Group: Individual Therapy  JLadoris Gene4/30/2021, 10:08 AM

## 2019-11-01 NOTE — Progress Notes (Signed)
Speech Language Pathology Daily Session Note  Patient Details  Name: Brittney Kennedy MRN: [id number] Date of Birth: 01-Jun-1951  Today's Date: 11/01/2019 SLP Individual Time: 1000-1045 SLP Individual Time Calculation (min): 45 min  Short Term Goals: Week 3: SLP Short Term Goal 1 (Week 3): Pt will sustain attention to basic familiar task for ~ 5 minutes with Min A cues. SLP Short Term Goal 2 (Week 3): Provided Min A cues, pt will utilize speech intelligibilty strategies to achieve 90% intelligibility at sentence level. SLP Short Term Goal 3 (Week 3): Pt will demonstrate ability to problem solve in functional basic situations with Mod A mutimodal cues. SLP Short Term Goal 4 (Week 3): Pt will recall daily and/or new information with Mod A cues for use of compensatory memory strategies. SLP Short Term Goal 5 (Week 3): Pt will demonstrate efficient mastication and oral clearance with Min A cues during upgraded trials of Dysphaiga 3 solids prior to upgrade. SLP Short Term Goal 6 (Week 3): Pt will demonstrate ability to use her compensatory maneuver with thin liquids (left head turn) with Min A verbal cues.  Skilled Therapeutic Interventions: Pt was seen for skilled ST targeting dysphagia and cognition. SLP facilitated session with upgraded dysphagia 3 trial and thin liquids. Pt demonstrated munch style and mildly prolonged mastication of Dys 3 solid, but total oral clearance with Supervision A for use of strategies. No pocketing noted. Pt also used compensatory swallow maneuver (left head turn) with thins with 2 verbal cues. No overt s/sx aspiration noted across solids or liquids. Recommend continue current diet and will ccontinue to assess readiness for solid advancement after additional trials.  Pt also completed a basic medication management task from the AL[facility] with overall Mod A verbal and visual cues for scanning when reading medication labels, but only Min A for problem solving. She sustained her  attention to tasks much better today in comparison to yesterday, with only Min A for redirection in 10 minute intervals.      Pain Pain Assessment Pain Scale: 0-10 Pain Score: 0-No pain  Therapy/Group: Individual Therapy  [name] 11/01/2019, 7:17 AM

## 2019-11-01 NOTE — Progress Notes (Signed)
[facility] PHYSICAL MEDICINE & REHABILITATION PROGRESS NOTE  Subjective/Complaints: Patient seen sitting up in bed this morning, eating breakfast.  Son at bedside.  Patient states she slept well overnight.  Son with questions regarding magnesium.  ROS: Left shoulder pain.  Denies CP, shortness of breath, nausea, vomiting, diarrhea.  Objective: Vital Signs: Blood pressure (!) 153/59, pulse 78, temperature 97.7 F (36.5 C), temperature source Oral, resp. rate 16, height 5\' 2"  (1.575 m), weight 68.8 kg, SpO2 96 %. No results found. No results for input(s): WBC, HGB, HCT, PLT in the last 72 hours. Recent Labs    10/30/19 0735  NA 141  K 4.0  CL 106  CO2 24  GLUCOSE 133*  BUN 18  CREATININE 0.98  CALCIUM 10.0    Physical Exam: BP (!) 153/59 (BP Location: Left Arm)   Pulse 78   Temp 97.7 F (36.5 C) (Oral)   Resp 16   Ht 5\' 2"  (1.575 m)   Wt 68.8 kg   SpO2 96%   BMI 27.74 kg/m  Constitutional: No distress . Vital signs reviewed. HENT: Normocephalic.  Atraumatic. Eyes: EOMI. No discharge. Cardiovascular: No JVD. Respiratory: Normal effort.  No stridor. GI: Non-distended. Skin: Warm and dry.  Intact. Psych: Overall flat. Musc: No edema in extremities.  No tenderness in extremities. Neuro: Alert Left inattention, improving Dysarthria, unchanged Facial weakness, unchanged Motor:  RUE/RLE: 5/5 proximal distal LUE: Shoulder abduction 3-/5, distally 4/5 LLE: 4-/5 proximal to distal (?  Some pain inhibition)  Assessment/Plan: 1. Functional deficits secondary to bilateral embolic infarcts.  Which require 3+ hours per day of interdisciplinary therapy in a comprehensive inpatient rehab setting.  Physiatrist is providing close team supervision and 24 hour management of active medical problems listed below.  Physiatrist and rehab team continue to assess barriers to discharge/monitor patient progress toward functional and medical goals  Care Tool:  Bathing    Body parts  bathed by patient: Face, Chest, Abdomen, Front perineal area, Buttocks, Right upper leg, Left upper leg, Right lower leg, Left lower leg, Left arm   Body parts bathed by helper: Right arm Body parts n/a: Right arm, Left arm   Bathing assist Assist Level: Minimal Assistance - Patient > 75%     Upper Body Dressing/Undressing Upper body dressing   What is the patient wearing?: Pull over shirt    Upper body assist Assist Level: Minimal Assistance - Patient > 75%    Lower Body Dressing/Undressing Lower body dressing      What is the patient wearing?: Pants     Lower body assist Assist for lower body dressing: Minimal Assistance - Patient > 75%     Toileting Toileting    Toileting assist Assist for toileting: Moderate Assistance - Patient 50 - 74%     Transfers Chair/bed transfer  Transfers assist     Chair/bed transfer assist level: Minimal Assistance - Patient > 75%     Locomotion Ambulation   Ambulation assist      Assist level: Minimal Assistance - Patient > 75% Assistive device: Walker-rolling Max distance: 15'   Walk 10 feet activity   Assist     Assist level: Minimal Assistance - Patient > 75% Assistive device: Walker-rolling   Walk 50 feet activity   Assist Walk 50 feet with 2 turns activity did not occur: Safety/medical concerns  Assist level: Minimal Assistance - Patient > 75% Assistive device: Walker-rolling    Walk 150 feet activity   Assist Walk 150 feet activity did not  occur: Safety/medical concerns         Walk 10 feet on uneven surface  activity   Assist Walk 10 feet on uneven surfaces activity did not occur: Safety/medical concerns         Wheelchair     Assist Will patient use wheelchair at discharge?: (TBD)             Wheelchair 50 feet with 2 turns activity    Assist            Wheelchair 150 feet activity     Assist            Medical Problem List and Plan: 1.  Altered mental  status left hemiparesis secondary to right MCA infarction along with a small left CR and splenium infarct embolic pattern likely due to transient cardiomyopathy with low EF after cardiac catheterization for non-STEMI with stenting complicated by groin hematoma  Continue CIR  Repeat CT unremarkable for changes 2.  Antithrombotics: -DVT/anticoagulation: SCDs             -antiplatelet therapy: Plavix 3. Pain Management: Tylenol as needed  Voltaren gel to bilateral knees ordered on 4/27, with improvement 4. Mood: Amantadine 100 mg twice daily, DC'd secondary to hallucinations  Melatonin 3 mg nightly             -antipsychotic agents: N/A 5. Neuropsych: This patient is not capable of making decisions on her own behalf. 6. Skin/Wound Care: Routine skin checks 7. Fluids/Electrolytes/Nutrition: Routine in and outs.   BMP within acceptable range on 4/28 8.  GI bleed/duodenal ulcer.  Status post EGD 10/08/2019 with epi and hemostasis clips x5.  PPI twice daily             See #9 9.  Acute blood loss anemia.    Hemoglobin 11.0 on 4/24  Continue to monitor 10.  AKI.  Resolved.  11.  Acute urinary tract infection.  Patient completed 5-day course amoxicillin 10/17/2019 12.  Post stroke dysphagia.  Dysphagia #2 thins liquids.  Follow-up speech therapy             Advance diet as tolerated  Plan to transition to current modified diet after ability to consume regular textures 13.  Keratoconjunctivitis.  Follow-up ophthalmology services.  Completed 7-day course of erythromycin drops on 4/17. 14.  Hypertension.     Increased Coreg to 6.25 mg BID with meals  Discussed with pharmacy, started on Losartan 12.5 on 4/22, increased to 25 mg, increased to 50 on 4/28 with eventual plans for Entresto  Remains elevated on 4/30, weight further heart failure Rx 15.  Hyperlipidemia.  Lipitor 16.  Prediabetes.  Hemoglobin A1c 5.9.  SSI CBG (last 3)  Recent Labs    10/31/19 1638 10/31/19 2051 11/01/19 0602  GLUCAP  140* 207* 147*   Labile on 4/30 17. Acute systolic CHF   Filed Weights   10/30/19 1612 10/31/19 0500 11/01/19 0505  Weight: 67.1 kg 68 kg 68.8 kg   Stable on 4/30 18. Hypomagnesemia  Mag 1.6 on 4/30, labs ordered for Monday  Supplemented IV on 4/19, again on 4/23, again on 4/27  Oral supplementation initiated, increased on 4/30 19.  Hypokalemia  Potassium 3.7 on 4/23 after supplementation 20. Mod/Severe Hypoalbuminemia  Supplement initiated 21.  Urinary frequency  PVRs ordered, only performed x1 with minimal residual  UA unremarkable, urine culture minimal growth   Keflex course completed on 4/29  Enablex started on 4/30  LOS: 16 days A FACE TO  FACE EVALUATION WAS PERFORMED  Laurieanne Galloway Karis Juba 11/01/2019, 11:21 AM

## 2019-11-01 NOTE — Progress Notes (Signed)
Occupational Therapy Weekly Progress Note  Patient Details  Name: Brittney Kennedy MRN: [id number] Date of Birth: 22-Jul-1950  Beginning of progress report period: October 24, 2019 End of progress report period: November 01, 2019  Today's Date: 11/01/2019 OT Individual Time: 1345-1445 OT Individual Time Calculation (min): 60 min    Patient has met 3 of 4 short term goals.  Pt is making good progress with her LE strength to allow her to complete sit to stands and transfers with min A.  She has shoulder pain from a prior injury that inhibits more aggressive NMR for LUE. Her gross grasp and tip pinch have improved so she can use her hand as a stabilizing A.    Patient continues to demonstrate the following deficits: muscle weakness, decreased cardiorespiratoy endurance, abnormal tone, unbalanced muscle activation, decreased coordination and decreased motor planning, decreased visual acuity, decreased visual perceptual skills, decreased visual motor skills and field cut, decreased attention to left, decreased attention, decreased problem solving and decreased memory and decreased standing balance, decreased postural control, hemiplegia and decreased balance strategies and therefore will continue to benefit from skilled OT intervention to enhance overall performance with BADL.  Patient progressing toward long term goals..  Continue plan of care.  OT Short Term Goals Week 1:  OT Short Term Goal 1 (Week 1): Pt will be able to use L hand as a stabilizing A with UB bathing. OT Short Term Goal 1 - Progress (Week 1): Progressing toward goal OT Short Term Goal 2 (Week 1): Pt will be able to find items on her food tray on L side with min cues. OT Short Term Goal 2 - Progress (Week 1): Met OT Short Term Goal 3 (Week 1): Pt will complete squat pivot to toilet with mod A. OT Short Term Goal 3 - Progress (Week 1): Met OT Short Term Goal 4 (Week 1): Pt will don a shirt with mod A. OT Short Term Goal 4 - Progress  (Week 1): Met OT Short Term Goal 5 (Week 1): Pt will rise to stand with mod A. OT Short Term Goal 5 - Progress (Week 1): Met Week 2:  OT Short Term Goal 1 (Week 2): Pt will demonstrate improved attention and ability to follow directions to don shirt with verbal cues and set up. OT Short Term Goal 1 - Progress (Week 2): Progressing toward goal OT Short Term Goal 2 (Week 2): Pt will don pants over L foot with steadying A. OT Short Term Goal 2 - Progress (Week 2): Met OT Short Term Goal 3 (Week 2): Pt will use L hand as a stabilizing A when opening/closing containers. OT Short Term Goal 3 - Progress (Week 2): Met OT Short Term Goal 4 (Week 2): Pt will be able to lift L shoulder to 45 degrees abduction to be able to wash under arm pit easily. OT Short Term Goal 4 - Progress (Week 2): Met Week 3:  OT Short Term Goal 1 (Week 3): Pt will be able to complete a toilet transfer with CGA. OT Short Term Goal 2 (Week 3): Pt will complete toileting with CGA. OT Short Term Goal 3 (Week 3): Pt will bathe in shower with CGA only when standing demonstrating improved LUE motor control. OT Short Term Goal 4 (Week 3): Pt will demonstrate improved L shoulder AROM and movement to don shirt with S only.  Skilled Therapeutic Interventions/Progress Updates:    Pt received in w/c connected to continuous IV.  Pt in same clothing from  Wed after her shower. Unable to change her UB clothing due to IV and pt stated she only had heavier weight pants that were too hot. She wants to wait until her son arrives with more clothing.   Seen this session to focus on LUE NMR.  Worked from w/c level for half the session and then transitioned to bed as pt c/o pain in back of shoulder in supraspinatus region.    Used a/arom with bimanual activity of holding a dowel bar and a ball to focus on elbow flex/ext and forearm pron/supin.     Isolated wrist extension as she has limited active ext due to increase flexor tone.      Pt with  frequent c/o pain about shoulder, hips, front of thighs. Encouraged pt to rest in bed until the next session.  Bed alarm set and all needs met.   Therapy Documentation Precautions:  Precautions Precautions: Fall, Other (comment) Precaution Comments: aspiration precautions (HOB >30 degrees), significant L inattention/neglect, signifcant L hemiparesis (UE>LE with L shoulder subluxation) Restrictions Weight Bearing Restrictions: No Therapy Vitals Temp: (!) 97.5 F (36.4 C) Temp Source: Oral Pulse Rate: 75 Resp: 16 BP: (!) 156/60 Patient Position (if appropriate): Sitting Oxygen Therapy SpO2: 100 % O2 Device: Room Air Pain: Pain Assessment Pain Scale: 0-10 Pain Score: 6  Pain Type: Chronic pain Pain Location: Shoulder Pain Orientation: Left Pain Descriptors / Indicators: Aching Pain Onset: On-going Pain Intervention(s): Massage ADL: ADL Grooming: Supervision/safety Where Assessed-Grooming: Sitting at sink Upper Body Bathing: Minimal assistance Where Assessed-Upper Body Bathing: Shower Lower Body Bathing: Minimal assistance Where Assessed-Lower Body Bathing: Shower Upper Body Dressing: Minimal assistance Where Assessed-Upper Body Dressing: Wheelchair Lower Body Dressing: Moderate assistance Where Assessed-Lower Body Dressing: Wheelchair Toileting: Moderate assistance Where Assessed-Toileting: Glass blower/designer: Moderate assistance Toilet Transfer Method: Stand pivot Toilet Transfer Equipment: Grab bars, Raised toilet seat Social research officer, government: Minimal assistance Social research officer, government Method: Radiographer, therapeutic: Shower seat with back, Grab bars   Therapy/Group: Individual Therapy  [place] 11/01/2019, 3:17 PM

## 2019-11-01 NOTE — Progress Notes (Signed)
Physical Therapy Session Note  Patient Details  Name: Brittney Kennedy MRN: [id number] Date of Birth: Nov 12, 1950  Today's Date: 11/01/2019 PT Individual Time: 1705-1735 PT Individual Time Calculation (min): 30 min   Short Term Goals: Week 3:  PT Short Term Goal 1 (Week 3): Pt will perform supine<>sit with CGA PT Short Term Goal 2 (Week 3): Pt will perform sit<>stand using LRAD with CGA PT Short Term Goal 3 (Week 3): Pt will perform bed<>chair transfers using LRAD with CGA PT Short Term Goal 4 (Week 3): Pt will ambulate at least 12ft using LRAD with min assist of 1  Skilled Therapeutic Interventions/Progress Updates:    Pt received supine in bed and agreeable to therapy session. Supine>sitting R EOB with min assist for trunk upright and max multimodal cuing for sequencing. Sit<>stands to/from RW with cuing for R hand on L knee to promote increased L LE WBing with min assist for balance. Gait ~66ft x2 to/from bathroom using RW with min assist for balance and AD management  - cuing for increased L LE step length and foot clearance. Sit<>stand to/from toilet with min assist for lifting/lowering. Continent of bladder and performed seated peri-care with min assist to engage L UE in grasping toilet paper. Pt appears significantly drowsy/fatigued this afternoon with difficulty keeping eyes open. Seated hand hygiene at sink with min assist. Transported to/from gym in w/c for time management and energy conservation. Performed seated B LE reciprocal movement patterns on kintetron against 30cm/sec resistance for 3 minutes with multimodal cuing for increased L LE excursion of movement to increase glute activation. Transported back to room and left seated in w/c with needs in reach and seat belt alarm on.  Therapy Documentation Precautions:  Precautions Precautions: Fall, Other (comment) Precaution Comments: aspiration precautions (HOB >30 degrees), significant L inattention/neglect, signifcant L hemiparesis  (UE>LE with L shoulder subluxation) Restrictions Weight Bearing Restrictions: No  Pain: No reports of pain throughout session.  Therapy/Group: Individual Therapy  Ginny Forth, PT, DPT 11/01/2019, 4:04 PM

## 2019-11-02 ENCOUNTER — Inpatient Hospital Stay (HOSPITAL_COMMUNITY): Payer: Medicare Other | Admitting: Occupational Therapy

## 2019-11-02 LAB — GLUCOSE, CAPILLARY
Glucose-Capillary: 144 mg/dL — ABNORMAL HIGH (ref 70–99)
Glucose-Capillary: 210 mg/dL — ABNORMAL HIGH (ref 70–99)
Glucose-Capillary: 121 mg/dL — ABNORMAL HIGH (ref 70–99)
Glucose-Capillary: 187 mg/dL — ABNORMAL HIGH (ref 70–99)

## 2019-11-02 MED ORDER — LOSARTAN POTASSIUM 50 MG PO TABS
25.0000 mg | ORAL_TABLET | Freq: Once | ORAL | Status: AC
  Administered 2019-11-02: 14:00:00 25 mg via ORAL
  Filled 2019-11-02: qty 1

## 2019-11-02 MED ORDER — LOSARTAN POTASSIUM 50 MG PO TABS
75.0000 mg | ORAL_TABLET | Freq: Every day | ORAL | Status: DC
  Administered 2019-11-03 – 2019-11-12 (×10): 75 mg via ORAL
  Filled 2019-11-02 (×10): qty 2

## 2019-11-02 NOTE — Progress Notes (Signed)
[place] PHYSICAL MEDICINE & REHABILITATION PROGRESS NOTE  Subjective/Complaints: Patient seen laying in bed this morning.  She indicates she slept well overnight, but is still sleepy.  No reported issues overnight.  ROS: Denies CP, shortness of breath, nausea, vomiting, diarrhea.  Objective: Vital Signs: Blood pressure (!) 163/69, pulse 75, temperature 98.3 F (36.8 C), resp. rate 17, height 5\' 2"  (1.575 m), weight 67.7 kg, SpO2 95 %. No results found. No results for input(s): WBC, HGB, HCT, PLT in the last 72 hours. No results for input(s): NA, K, CL, CO2, GLUCOSE, BUN, CREATININE, CALCIUM in the last 72 hours.  Physical Exam: BP (!) 163/69   Pulse 75   Temp 98.3 F (36.8 C)   Resp 17   Ht 5\' 2"  (1.575 m)   Wt 67.7 kg   SpO2 95%   BMI 27.30 kg/m  Constitutional: No distress . Vital signs reviewed. HENT: Normocephalic.  Atraumatic. Eyes: EOMI. No discharge. Cardiovascular: No JVD. Respiratory: Normal effort.  No stridor. GI: Non-distended. Skin: Warm and dry.  Intact. Psych: Limited engagement. Musc: No edema in extremities.  No tenderness in extremities. Neuro: Alert Left inattention, improving Dysarthria, stable Facial weakness, unchanged Motor:  RUE/RLE: 5/5 proximal distal LUE: Shoulder abduction 3-/5, distally 4/5 LLE: 4-/5 proximal to distal (?  Some pain inhibition), appears unchanged  Assessment/Plan: 1. Functional deficits secondary to bilateral embolic infarcts.  Which require 3+ hours per day of interdisciplinary therapy in a comprehensive inpatient rehab setting.  Physiatrist is providing close team supervision and 24 hour management of active medical problems listed below.  Physiatrist and rehab team continue to assess barriers to discharge/monitor patient progress toward functional and medical goals  Care Tool:  Bathing    Body parts bathed by patient: Face, Chest, Abdomen, Front perineal area, Right upper leg, Left upper leg, Right lower leg,  Left lower leg, Left arm   Body parts bathed by helper: Right arm, Buttocks Body parts n/a: Right arm, Left arm   Bathing assist Assist Level: Minimal Assistance - Patient > 75%     Upper Body Dressing/Undressing Upper body dressing   What is the patient wearing?: Pull over shirt    Upper body assist Assist Level: Minimal Assistance - Patient > 75%    Lower Body Dressing/Undressing Lower body dressing      What is the patient wearing?: Pants, Incontinence brief     Lower body assist Assist for lower body dressing: Maximal Assistance - Patient 25 - 49%     Toileting Toileting    Toileting assist Assist for toileting: Moderate Assistance - Patient 50 - 74%     Transfers Chair/bed transfer  Transfers assist     Chair/bed transfer assist level: Minimal Assistance - Patient > 75%     Locomotion Ambulation   Ambulation assist      Assist level: Minimal Assistance - Patient > 75% Assistive device: Walker-rolling Max distance: 43ft   Walk 10 feet activity   Assist     Assist level: Minimal Assistance - Patient > 75% Assistive device: Walker-rolling   Walk 50 feet activity   Assist Walk 50 feet with 2 turns activity did not occur: Safety/medical concerns  Assist level: Minimal Assistance - Patient > 75% Assistive device: Walker-rolling    Walk 150 feet activity   Assist Walk 150 feet activity did not occur: Safety/medical concerns         Walk 10 feet on uneven surface  activity   Assist Walk 10 feet on uneven  surfaces activity did not occur: Safety/medical concerns         Wheelchair     Assist Will patient use wheelchair at discharge?: (TBD)             Wheelchair 50 feet with 2 turns activity    Assist            Wheelchair 150 feet activity     Assist            Medical Problem List and Plan: 1.  Altered mental status left hemiparesis secondary to right MCA infarction along with a small left CR  and splenium infarct embolic pattern likely due to transient cardiomyopathy with low EF after cardiac catheterization for non-STEMI with stenting complicated by groin hematoma  Continue CIR  Repeat CT unremarkable for changes 2.  Antithrombotics: -DVT/anticoagulation: SCDs             -antiplatelet therapy: Plavix 3. Pain Management: Tylenol as needed  Voltaren gel to bilateral knees ordered on 4/27, with improvement  Controlled with intervention on 5/1 4. Mood: Amantadine 100 mg twice daily, DC'd secondary to hallucinations  Melatonin 3 mg nightly             -antipsychotic agents: N/A 5. Neuropsych: This patient is not capable of making decisions on her own behalf. 6. Skin/Wound Care: Routine skin checks 7. Fluids/Electrolytes/Nutrition: Routine in and outs.   BMP within acceptable range on 4/28 8.  GI bleed/duodenal ulcer.  Status post EGD 10/08/2019 with epi and hemostasis clips x5.  PPI twice daily             See #9 9.  Acute blood loss anemia.    Hemoglobin 11.0 on 4/24, labs ordered for Monday  Continue to monitor 10.  AKI.  Resolved.  11.  Acute urinary tract infection.  Patient completed 5-day course amoxicillin 10/17/2019 12.  Post stroke dysphagia.  Dysphagia #2 thins liquids.  Follow-up speech therapy             Advance diet as tolerated  Plan to transition to current modified diet after ability to consume regular textures 13.  Keratoconjunctivitis.  Follow-up ophthalmology services.  Completed 7-day course of erythromycin drops on 4/17. 14.  Hypertension.     Increased Coreg to 6.25 mg BID with meals  Discussed with pharmacy, started on Losartan 12.5 on 4/22, increased to 25 mg, increased to 50 on 4/28, increased to 75 on 5/1 with eventual plans for Entresto 15.  Hyperlipidemia.  Lipitor 16.  Prediabetes.  Hemoglobin A1c 5.9.  SSI CBG (last 3)  Recent Labs    11/01/19 2106 11/02/19 0631 11/02/19 1152  GLUCAP 194* 144* 210*   Labile on 5/1  See #12 17. Acute  systolic CHF   Filed Weights   10/31/19 0500 11/01/19 0505 11/02/19 0335  Weight: 68 kg 68.8 kg 67.7 kg   Stable on 5/1 18. Hypomagnesemia  Mag 1.6 on 4/30, labs ordered for Monday  Supplemented IV on 4/19, again on 4/23, again on 4/27  Oral supplementation initiated, increased on 4/30 19.  Hypokalemia  Potassium 3.7 on 4/23 after supplementation 20. Mod/Severe Hypoalbuminemia  Supplement initiated 21.  Urinary frequency  PVRs ordered, only performed x1 with minimal residual  UA unremarkable, urine culture minimal growth   Keflex course completed on 4/29  Enablex started on 4/30, continue to monitor  LOS: 17 days A FACE TO FACE EVALUATION WAS PERFORMED  Margarito Dehaas Karis Juba 11/02/2019, 12:15 PM

## 2019-11-02 NOTE — Progress Notes (Signed)
Occupational Therapy Session Note  Patient Details  Name: Brittney Kennedy MRN: [id number] Date of Birth: June 29, 1951  Today's Date: 11/02/2019 OT Individual Time: 1003-1101 OT Individual Time Calculation (min): 58 min   Short Term Goals: Week 3:  OT Short Term Goal 1 (Week 3): Pt will be able to complete a toilet transfer with CGA. OT Short Term Goal 2 (Week 3): Pt will complete toileting with CGA. OT Short Term Goal 3 (Week 3): Pt will bathe in shower with CGA only when standing demonstrating improved LUE motor control. OT Short Term Goal 4 (Week 3): Pt will demonstrate improved L shoulder AROM and movement to don shirt with S only.   Skilled Therapeutic Interventions/Progress Updates:    Pt greeted in bed, son and his spouse present. Spouse is non [place] speaking (Spanish speaking) and present to observe throughout OT session per request of son. Pt completed supine<sit with HOB elevated and Min A for Lt LE guidance. While EOB assisted pt with placing both UEs onto her Lt knee to increase Lt LE weightbearing in standing. Started with ambulatory toilet transfer using RW, Min-Mod A, more assistance and time required for turning safety. Max vcs for visual attendance to Lt for obstacle avoidance. Pt had continent bladder void and completed hygiene while sitting with supervision. Transitioned to shower chair after with Mod A and vcs. She then bathed at sit<stand level, vcs for increasing attendance to Lt hemibody, HOH to incorporate Lt hand to wash Rt side. OT assisted her with placing the Lt arm on grab bar to increase ease of washing. She utilized figure 4 position to wash both feet and don gripper socks afterwards, Mod A for donning on the Lt side. Dressing completed sit<stand from elevated toilet using device. Once again, worked on sit<stands with both hands placed over Lt knee (Min A). Pt required Max A for LB dressing due to pt fatigue at this time. Min A for UB dressing using hemi strategies. She then  ambulated to the w/c using RW. Left her in care of son to comb hair.   Therapy Documentation Precautions:  Precautions Precautions: Fall, Other (comment) Precaution Comments: aspiration precautions (HOB >30 degrees), significant L inattention/neglect, signifcant L hemiparesis (UE>LE with L shoulder subluxation) Restrictions Weight Bearing Restrictions: No Pain: Pain Assessment Pain Scale: 0-10 Pain Score: 7  Pain Type: Chronic pain Pain Location: Shoulder Pain Orientation: Left Pain Descriptors / Indicators: Aching Pain Frequency: Intermittent Pain Onset: On-going Patients Stated Pain Goal: 2 Pain Intervention(s): Medication (See eMAR) ADL: ADL Grooming: Supervision/safety Where Assessed-Grooming: Sitting at sink Upper Body Bathing: Minimal assistance Where Assessed-Upper Body Bathing: Shower Lower Body Bathing: Minimal assistance Where Assessed-Lower Body Bathing: Shower Upper Body Dressing: Minimal assistance Where Assessed-Upper Body Dressing: Wheelchair Lower Body Dressing: Moderate assistance Where Assessed-Lower Body Dressing: Wheelchair Toileting: Moderate assistance Where Assessed-Toileting: Teacher, adult education: Moderate assistance Toilet Transfer Method: Stand pivot Toilet Transfer Equipment: Grab bars, Raised toilet seat Film/video editor: Minimal assistance Film/video editor Method: Warden/ranger: Shower seat with back, Grab bars      Therapy/Group: Individual Therapy  Loucille Takach A Quasean Frye 11/02/2019, 12:49 PM

## 2019-11-03 ENCOUNTER — Inpatient Hospital Stay (HOSPITAL_COMMUNITY): Payer: Medicare Other | Admitting: Occupational Therapy

## 2019-11-03 LAB — URINALYSIS, COMPLETE (UACMP) WITH MICROSCOPIC
Bacteria, UA: NONE SEEN
Bilirubin Urine: NEGATIVE
Glucose, UA: NEGATIVE mg/dL
Hgb urine dipstick: NEGATIVE
Ketones, ur: NEGATIVE mg/dL
Nitrite: NEGATIVE
Protein, ur: 100 mg/dL — AB
Specific Gravity, Urine: 1.019 (ref 1.005–1.030)
pH: 5 (ref 5.0–8.0)

## 2019-11-03 LAB — GLUCOSE, CAPILLARY
Glucose-Capillary: 154 mg/dL — ABNORMAL HIGH (ref 70–99)
Glucose-Capillary: 177 mg/dL — ABNORMAL HIGH (ref 70–99)
Glucose-Capillary: 154 mg/dL — ABNORMAL HIGH (ref 70–99)
Glucose-Capillary: 208 mg/dL — ABNORMAL HIGH (ref 70–99)

## 2019-11-03 MED ORDER — DARIFENACIN HYDROBROMIDE ER 15 MG PO TB24
15.0000 mg | ORAL_TABLET | Freq: Every day | ORAL | Status: DC
  Administered 2019-11-03 – 2019-11-13 (×11): 15 mg via ORAL
  Filled 2019-11-03 (×12): qty 1

## 2019-11-03 NOTE — Progress Notes (Signed)
[place] PHYSICAL MEDICINE & REHABILITATION PROGRESS NOTE  Subjective/Complaints: Patient seen laying in bed this morning.  She states she slept well overnight, however per nursing, patient with urinary frequency overnight.  ROS: Denies CP, shortness of breath, nausea, vomiting, diarrhea.  Objective: Vital Signs: Blood pressure (!) 181/77, pulse 76, temperature 98.3 F (36.8 C), resp. rate 18, height 5\' 2"  (1.575 m), weight 68 kg, SpO2 97 %. No results found. No results for input(s): WBC, HGB, HCT, PLT in the last 72 hours. No results for input(s): NA, K, CL, CO2, GLUCOSE, BUN, CREATININE, CALCIUM in the last 72 hours.  Physical Exam: BP (!) 181/77   Pulse 76   Temp 98.3 F (36.8 C)   Resp 18   Ht 5\' 2"  (1.575 m)   Wt 68 kg   SpO2 97%   BMI 27.42 kg/m  Constitutional: No distress . Vital signs reviewed. HENT: Normocephalic.  Atraumatic. Eyes: EOMI. No discharge. Cardiovascular: No JVD. Respiratory: Normal effort.  No stridor. GI: Non-distended. Skin: Warm and dry.  Intact. Psych: Limited engagement Musc: No edema in extremities.  No tenderness in extremities. Neuro: Alert Left inattention, improving Dysarthria, stable Facial weakness, stable Motor:  RUE/RLE: 5/5 proximal distal LUE: Shoulder abduction 3-/5, distally 4/5, unchanged LLE: 4-/5 proximal to distal (?  Some pain inhibition), appears unchanged  Assessment/Plan: 1. Functional deficits secondary to bilateral embolic infarcts.  Which require 3+ hours per day of interdisciplinary therapy in a comprehensive inpatient rehab setting.  Physiatrist is providing close team supervision and 24 hour management of active medical problems listed below.  Physiatrist and rehab team continue to assess barriers to discharge/monitor patient progress toward functional and medical goals  Care Tool:  Bathing    Body parts bathed by patient: Face, Chest, Abdomen, Front perineal area, Right upper leg, Left upper leg, Right  lower leg, Left lower leg, Left arm   Body parts bathed by helper: Right arm, Buttocks Body parts n/a: Right arm, Left arm   Bathing assist Assist Level: Minimal Assistance - Patient > 75%     Upper Body Dressing/Undressing Upper body dressing   What is the patient wearing?: Pull over shirt    Upper body assist Assist Level: Supervision/Verbal cueing    Lower Body Dressing/Undressing Lower body dressing      What is the patient wearing?: Pants, Incontinence brief     Lower body assist Assist for lower body dressing: Moderate Assistance - Patient 50 - 74%     Toileting Toileting    Toileting assist Assist for toileting: Moderate Assistance - Patient 50 - 74%     Transfers Chair/bed transfer  Transfers assist     Chair/bed transfer assist level: Minimal Assistance - Patient > 75%     Locomotion Ambulation   Ambulation assist      Assist level: Minimal Assistance - Patient > 75% Assistive device: Walker-rolling Max distance: 69ft   Walk 10 feet activity   Assist     Assist level: Minimal Assistance - Patient > 75% Assistive device: Walker-rolling   Walk 50 feet activity   Assist Walk 50 feet with 2 turns activity did not occur: Safety/medical concerns  Assist level: Minimal Assistance - Patient > 75% Assistive device: Walker-rolling    Walk 150 feet activity   Assist Walk 150 feet activity did not occur: Safety/medical concerns         Walk 10 feet on uneven surface  activity   Assist Walk 10 feet on uneven surfaces activity did not  occur: Safety/medical concerns         Wheelchair     Assist Will patient use wheelchair at discharge?: (TBD)             Wheelchair 50 feet with 2 turns activity    Assist            Wheelchair 150 feet activity     Assist            Medical Problem List and Plan: 1.  Altered mental status left hemiparesis secondary to right MCA infarction along with a small left CR  and splenium infarct embolic pattern likely due to transient cardiomyopathy with low EF after cardiac catheterization for non-STEMI with stenting complicated by groin hematoma  Continue CIR  Repeat CT unremarkable for changes 2.  Antithrombotics: -DVT/anticoagulation: SCDs             -antiplatelet therapy: Plavix 3. Pain Management: Tylenol as needed  Voltaren gel to bilateral knees ordered on 4/27, with improvement  Controlled with intervention on 5/1 4. Mood: Amantadine 100 mg twice daily, DC'd secondary to hallucinations  Melatonin 3 mg nightly             -antipsychotic agents: N/A 5. Neuropsych: This patient is not capable of making decisions on her own behalf. 6. Skin/Wound Care: Routine skin checks 7. Fluids/Electrolytes/Nutrition: Routine in and outs.   BMP within acceptable range on 4/28 8.  GI bleed/duodenal ulcer.  Status post EGD 10/08/2019 with epi and hemostasis clips x5.  PPI twice daily             See #9 9.  Acute blood loss anemia.    Hemoglobin 11.0 on 4/24, labs ordered for tomorrow  Continue to monitor 10.  AKI.  Resolved.  11.  Acute urinary tract infection.  Patient completed 5-day course amoxicillin 10/17/2019 12.  Post stroke dysphagia.  Dysphagia #2 thins liquids.  Follow-up speech therapy             Advance diet as tolerated  Plan to transition to current modified diet after ability to consume regular textures 13.  Keratoconjunctivitis.  Follow-up ophthalmology services.  Completed 7-day course of erythromycin drops on 4/17. 14.  Hypertension.     Increased Coreg to 6.25 mg BID with meals  Discussed with pharmacy, started on Losartan 12.5 on 4/22, increased to 25 mg, increased to 50 on 4/28, increased to 75 on 5/1 with eventual plans for Entresto  Labile and trending up on 5/2 15.  Hyperlipidemia.  Lipitor 16.  Prediabetes.  Hemoglobin A1c 5.9.  SSI CBG (last 3)  Recent Labs    11/02/19 1642 11/02/19 2120 11/03/19 0606  GLUCAP 121* 187* 154*   Labile  on 5/2  See #12 17. Acute systolic CHF   Filed Weights   11/01/19 0505 11/02/19 0335 11/03/19 0429  Weight: 68.8 kg 67.7 kg 68 kg   Stable on 5/2 18. Hypomagnesemia  Mag 1.6 on 4/30, labs ordered for tomorrow  Supplemented IV on 4/19, again on 4/23, again on 4/27  Oral supplementation initiated, increased on 4/30 19.  Hypokalemia  Potassium 3.7 on 4/23 after supplementation 20. Mod/Severe Hypoalbuminemia  Supplement initiated 21.  Urinary frequency  PVRs ordered, only performed x1 with minimal residual  UA unremarkable, urine culture minimal growth, repeat UA ordered   Keflex course completed on 4/29  Enablex started on 4/30, increased on 5/2  LOS: 18 days A FACE TO FACE EVALUATION WAS PERFORMED  Cyanne Delmar Karis Juba 11/03/2019, 9:09  AM

## 2019-11-03 NOTE — Progress Notes (Signed)
Occupational Therapy Session Note  Patient Details  Name: Brittney Kennedy MRN: [id number] Date of Birth: 22-Nov-1950  Today's Date: 11/03/2019 OT Individual Time: 7416-3845 OT Individual Time Calculation (min): 43 min    Short Term Goals: Week 3:  OT Short Term Goal 1 (Week 3): Pt will be able to complete a toilet transfer with CGA. OT Short Term Goal 2 (Week 3): Pt will complete toileting with CGA. OT Short Term Goal 3 (Week 3): Pt will bathe in shower with CGA only when standing demonstrating improved LUE motor control. OT Short Term Goal 4 (Week 3): Pt will demonstrate improved L shoulder AROM and movement to don shirt with S only.  Skilled Therapeutic Interventions/Progress Updates:  Patient met lying supine in bed in agreement with OT treatment session with focus on L attention, L NMR and self-care re-education as detailed below. Patient positioned on bedpan with NT present upon entry. Pain 4/10 at rest in low back. Patient with request to wash up seated at sink level after toileting in supine with NT. Supine to EOB with Min A and sit to stand from EOB to RW with Min A and cueing for hand placement and L attention. Patient completed UB bathing/dressing seated at sink level with Min A and LB bathing/dressing in sitting/standing with Min-Mod and cues for L attention with hand over hand assist for use of LUE to bathe R side and to assist RLE to figure-4 position. Patient demonstrates decreased sitting balance with L lateral lean, requiring frequent multimodal cues for upright position in chair.  Set-up assist for breakfast meal. Session concluded with patient seated in wc with call bell within reach, chair alarm activated, and all needs met. RN present to administer morning meds.   Therapy Documentation Precautions:  Precautions Precautions: Fall, Other (comment) Precaution Comments: aspiration precautions (HOB >30 degrees), significant L inattention/neglect, signifcant L hemiparesis (UE>LE with  L shoulder subluxation) Restrictions Weight Bearing Restrictions: No  Therapy/Group: Individual Therapy  Ahsan Esterline R Howerton-Davis 11/03/2019, 6:57 AM
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
## 2019-11-04 ENCOUNTER — Inpatient Hospital Stay (HOSPITAL_COMMUNITY): Payer: Medicare Other | Admitting: Physical Therapy

## 2019-11-04 ENCOUNTER — Inpatient Hospital Stay (HOSPITAL_COMMUNITY): Payer: Medicare Other | Admitting: Occupational Therapy

## 2019-11-04 ENCOUNTER — Inpatient Hospital Stay (HOSPITAL_COMMUNITY): Payer: Medicare Other | Admitting: Speech Pathology

## 2019-11-04 LAB — GLUCOSE, CAPILLARY
Glucose-Capillary: 158 mg/dL — ABNORMAL HIGH (ref 70–99)
Glucose-Capillary: 244 mg/dL — ABNORMAL HIGH (ref 70–99)
Glucose-Capillary: 122 mg/dL — ABNORMAL HIGH (ref 70–99)
Glucose-Capillary: 198 mg/dL — ABNORMAL HIGH (ref 70–99)

## 2019-11-04 MED ORDER — METFORMIN HCL 500 MG PO TABS
500.0000 mg | ORAL_TABLET | Freq: Every day | ORAL | Status: DC
  Administered 2019-11-05 – 2019-11-14 (×10): 500 mg via ORAL
  Filled 2019-11-04 (×10): qty 1

## 2019-11-04 NOTE — Progress Notes (Signed)
Speech Language Pathology Daily Session Note  Patient Details  Name: Brittney Kennedy MRN: [id number] Date of Birth: 05-29-1951  Today's Date: 11/04/2019 SLP Individual Time: 1130-1200 SLP Individual Time Calculation (min): 30 min  Short Term Goals: Week 3: SLP Short Term Goal 1 (Week 3): Pt will sustain attention to basic familiar task for ~ 5 minutes with Min A cues. SLP Short Term Goal 2 (Week 3): Provided Min A cues, pt will utilize speech intelligibilty strategies to achieve 90% intelligibility at sentence level. SLP Short Term Goal 3 (Week 3): Pt will demonstrate ability to problem solve in functional basic situations with Mod A mutimodal cues. SLP Short Term Goal 4 (Week 3): Pt will recall daily and/or new information with Mod A cues for use of compensatory memory strategies. SLP Short Term Goal 5 (Week 3): Pt will demonstrate efficient mastication and oral clearance with Min A cues during upgraded trials of Dysphaiga 3 solids prior to upgrade. SLP Short Term Goal 6 (Week 3): Pt will demonstrate ability to use her compensatory maneuver with thin liquids (left head turn) with Min A verbal cues.  Skilled Therapeutic Interventions: Pt was seen for skilled ST targeting dysphagia and speech. Pt's son was present throughout session. SLP provided Moderate verbal and visual cues for accuracy in the implementation of left head turn compensatory swallow strategy with thin H2O intake. Throat clearing noted X2. Discussed cueing strategies with pt and her son to increase consistency in use of strategy. SLP also provided upgraded trial of dysphagia 3 solids. Pt's mastication was functional, and she demonstrated complete oral clearance without need for cueing. Would recommend trial of larger portion prior to full advancement, continue current diet for now.  Pt verbally recalled her main speech intelligibility strategies (slower rate, overarticulation) with Min A verbal cues. She also required Min A cues for  repeating unintelligible utterances in conversation regarding topics unknown to SLP, however she was overall ~85% intelligible in conversation with use of said strategies. Pt left sitting in wheelchair with alarm set and needs met, son still present. Continue per current plan of care.         Pain Pain Assessment Pain Scale: Faces Faces Pain Scale: No hurt  Therapy/Group: Individual Therapy  Arbutus Leas 11/04/2019, 7:15 AM

## 2019-11-04 NOTE — Progress Notes (Signed)
Occupational Therapy Session Note  Patient Details  Name: Brittney Kennedy MRN: [id number] Date of Birth: Apr 16, 1951  Today's Date: 11/04/2019 OT Individual Time: 1245-8099 OT Individual Time Calculation (min): 40 min    Short Term Goals: Week 3:  OT Short Term Goal 1 (Week 3): Pt will be able to complete a toilet transfer with CGA. OT Short Term Goal 2 (Week 3): Pt will complete toileting with CGA. OT Short Term Goal 3 (Week 3): Pt will bathe in shower with CGA only when standing demonstrating improved LUE motor control. OT Short Term Goal 4 (Week 3): Pt will demonstrate improved L shoulder AROM and movement to don shirt with S only.  Skilled Therapeutic Interventions/Progress Updates:    Treatment session with focus on Lt attention and LUE NMR.  Pt received supine in bed asleep, but easily aroused.  Pt declined any bathing/dressing this session.  Completed bed mobility with min assist to come to sitting at EOB, multimodal cues for Lt attention and positioning of LUE during bed mobility.  Engaged in table top task seated EOB with focus on visual scanning to Lt to obtain various items.  Progressed to utilizing LUE with hand over hand from RUE to facilitate increased reach.  Focus on gross motor control of LUE with manipulation of large blocks.  Engaged in thumb opposition, finger extension, and finger abduction with encouragement to continue to attempt finger isolation exercises between therapy sessions.  Returned to supine with min assist and left with all needs in reach.  Therapy Documentation Precautions:  Precautions Precautions: Fall, Other (comment) Precaution Comments: aspiration precautions (HOB >30 degrees), significant L inattention/neglect, signifcant L hemiparesis (UE>LE with L shoulder subluxation) Restrictions Weight Bearing Restrictions: No General:   Vital Signs: Therapy Vitals Temp: 98.2 F (36.8 C) Temp Source: Oral Pulse Rate: 75 Resp: 18 BP: 129/67 Patient Position  (if appropriate): Lying Oxygen Therapy SpO2: 96 % O2 Device: Room Air Pain: Pain Assessment Pain Scale: 0-10 Pain Score: 7  Faces Pain Scale: No hurt Pain Type: Chronic pain Pain Location: Hip Pain Orientation: Right Pain Descriptors / Indicators: Aching;Sore Pain Onset: On-going Patients Stated Pain Goal: 0 Pain Intervention(s): RN made aware;Repositioned Multiple Pain Sites: No   Therapy/Group: Individual Therapy  Rosalio Loud 11/04/2019, 3:22 PM

## 2019-11-04 NOTE — Progress Notes (Signed)
Physical Therapy Session Note  Patient Details  Name: Brittney Kennedy MRN: [id number] Date of Birth: 1951-07-03  Today's Date: 11/04/2019 PT Individual Time: 1517-1610 PT Individual Time Calculation (min): 53 min   Short Term Goals: Week 3:  PT Short Term Goal 1 (Week 3): Pt will perform supine<>sit with CGA PT Short Term Goal 2 (Week 3): Pt will perform sit<>stand using LRAD with CGA PT Short Term Goal 3 (Week 3): Pt will perform bed<>chair transfers using LRAD with CGA PT Short Term Goal 4 (Week 3): Pt will ambulate at least 128ft using LRAD with min assist of 1  Skilled Therapeutic Interventions/Progress Updates:   Pt received toileting w/ NT, therapist took over care from NT and pt agreeable to therapy. Sit>stand from [facility], mod assist to pull pants over hips. Stand pivot to w/c w/ min assist. Pt washed hands from seated level at sink w/ supervision and verbal cues for technique to incorporate LUE. Total assist w/c transport to/from therapy gym for time management. Worked on gait training w/ RW, no AFO ot shoes to emphasize need for increased L foot clearance. Ambulated 100' x2 w/ primarily CGA, min assist for RW management and safety w/ turns. Verbal and visual cues to attend to L foot and to increase L foot clearance w/ swing. Pt c/o increasing R hip and L knee pain during WB. She requested to return to room but agreeable to stay and participate in therapy if she can perform seated tasks. Performed NuStep w/ all extremities @ level 1 x5 min and 2 min to work on global strengthening/endurance and reciprocal movement pattern. Max tactile and verbal cues to attend to LUE/LLE and to maintain neutral LLE alignment. Max encouragement to continue participating. Pt eventually refusing to do any more activity, requesting to return to bed. Pt would not state why, she just said "I don't know". Returned to room, stand pivot to EOB w/ min assist sit>supine w/ supervision. Ice applied to R hip and L knee, per  pt's request for pain. She rates pain as 6/10, made RN aware. Provided supervision while pt ate ice chips in elevated supine, verbal reminders to turn head to L to swallow as per swallowing precautions. Ended session in supine, all needs in reach.   Therapy Documentation Precautions:  Precautions Precautions: Fall, Other (comment) Precaution Comments: aspiration precautions (HOB >30 degrees), significant L inattention/neglect, signifcant L hemiparesis (UE>LE with L shoulder subluxation) Restrictions Weight Bearing Restrictions: No Pain: Pain Assessment Pain Scale: 0-10 Pain Score: 7  Pain Type: Chronic pain Pain Location: Hip Pain Orientation: Right Pain Descriptors / Indicators: Aching;Sore Pain Onset: On-going Patients Stated Pain Goal: 0 Pain Intervention(s): RN made aware;Repositioned Multiple Pain Sites: No  Therapy/Group: Individual Therapy  Shabana Armentrout Melton Krebs 11/04/2019, 4:15 PM

## 2019-11-04 NOTE — Progress Notes (Signed)
Physical Therapy Session Note  Patient Details  Name: Brittney Kennedy MRN: [id number] Date of Birth: 1950-10-01  Today's Date: 11/04/2019 PT Individual Time: 0900-1002 PT Individual Time Calculation (min): 62 min   Short Term Goals: Week 3:  PT Short Term Goal 1 (Week 3): Pt will perform supine<>sit with CGA PT Short Term Goal 2 (Week 3): Pt will perform sit<>stand using LRAD with CGA PT Short Term Goal 3 (Week 3): Pt will perform bed<>chair transfers using LRAD with CGA PT Short Term Goal 4 (Week 3): Pt will ambulate at least 171ft using LRAD with min assist of 1  Skilled Therapeutic Interventions/Progress Updates:    Patient received in bed, pleasant and willing to work with therapy; [name] present and also observed session today. Able to get to EOB on R side with modA and Mod cues due to ongoing tendency for L inattention, able to maintain midline sitting with distant S once at EOB however. Able to perform most sit<->stand transfers with MinA/RW during session however did require ModA for boosting up from toilet in part due to R hip pain. Tolerated gait training in room 19ftx2 with RW and MinA/Mod cues for gait pattern but tolerance for gait limited today due to pain in R hip. Spent quite a bit of time with direct supervision from PT for safety on toilet attempting to have BM this morning- eventually successful but required MinA for balance and totalA for posterior pericare. Spent time discussing family ed options with Brittney Kennedy- her daughter in law Brittney Kennedy is from [place] and will be here to help her but does not really speak English and does not drive; Brittney Kennedy is leaving for work Thursday and won't be back for family education until the 11th and is requesting multiple education sessions due to language barrier with [place]. Current plan for discipline education on Wednesday and will still need to plan for the 11th/12th prior to DC. Patient left up in [facility] with room arranged to facilitate engagement and  improved attention to L side, seatbelt alarm active this morning.   Therapy Documentation Precautions:  Precautions Precautions: Fall, Other (comment) Precaution Comments: aspiration precautions (HOB >30 degrees), significant L inattention/neglect, signifcant L hemiparesis (UE>LE with L shoulder subluxation) Restrictions Weight Bearing Restrictions: No Pain: Pain Assessment Pain Scale: 0-10 Pain Score: 7  Faces Pain Scale: No hurt Pain Type: Chronic pain Pain Location: Hip Pain Orientation: Right Pain Descriptors / Indicators: Aching;Sore Pain Onset: On-going Patients Stated Pain Goal: 0 Pain Intervention(s): RN made aware;Repositioned Multiple Pain Sites: No    Therapy/Group: Individual Therapy   Brittney Kennedy, DPT, PN1   Supplemental Physical Therapist [facility]    Pager [phone] Acute Rehab Office [phone]    11/04/2019, 12:22 PM

## 2019-11-04 NOTE — Progress Notes (Signed)
[place] PHYSICAL MEDICINE & REHABILITATION PROGRESS NOTE  Subjective/Complaints:   ROS: Denies CP, shortness of breath, nausea, vomiting, diarrhea.  Objective: Vital Signs: Blood pressure (!) 149/68, pulse 83, temperature 98.6 F (37 C), resp. rate 18, height 5\' 2"  (1.575 m), weight 67.3 kg, SpO2 100 %. No results found. No results for input(s): WBC, HGB, HCT, PLT in the last 72 hours. No results for input(s): NA, K, CL, CO2, GLUCOSE, BUN, CREATININE, CALCIUM in the last 72 hours.  Physical Exam: BP (!) 149/68 (BP Location: Right Arm)   Pulse 83   Temp 98.6 F (37 C)   Resp 18   Ht 5\' 2"  (1.575 m)   Wt 67.3 kg   SpO2 100%   BMI 27.14 kg/m    General: No acute distress Mood and affect are appropriate Heart: Regular rate and rhythm no rubs murmurs or extra sounds Lungs: Clear to auscultation, breathing unlabored, no rales or wheezes Abdomen: Positive bowel sounds, soft nontender to palpation, nondistended Extremities: No clubbing, cyanosis, or edema Skin: No evidence of breakdown, no evidence of rash  Musculoskeletal: Full range of motion in all 4 extremities. No joint swelling Motor:  RUE/RLE: 5/5 proximal distal LUE: Shoulder abduction 3-/5, distally 4/5, unchanged LLE: 4-/5 proximal to distal (?  Some pain inhibition), appears unchanged  Assessment/Plan: 1. Functional deficits secondary to bilateral embolic infarcts.  Which require 3+ hours per day of interdisciplinary therapy in a comprehensive inpatient rehab setting.  Physiatrist is providing close team supervision and 24 hour management of active medical problems listed below.  Physiatrist and rehab team continue to assess barriers to discharge/monitor patient progress toward functional and medical goals  Care Tool:  Bathing    Body parts bathed by patient: Face, Chest, Abdomen, Front perineal area, Right upper leg, Left upper leg, Right lower leg, Left lower leg, Left arm   Body parts bathed by helper:  Right arm, Buttocks Body parts n/a: Right arm, Left arm   Bathing assist Assist Level: Minimal Assistance - Patient > 75%     Upper Body Dressing/Undressing Upper body dressing   What is the patient wearing?: Pull over shirt    Upper body assist Assist Level: Supervision/Verbal cueing    Lower Body Dressing/Undressing Lower body dressing      What is the patient wearing?: Pants, Incontinence brief     Lower body assist Assist for lower body dressing: Moderate Assistance - Patient 50 - 74%     Toileting Toileting    Toileting assist Assist for toileting: Moderate Assistance - Patient 50 - 74%     Transfers Chair/bed transfer  Transfers assist     Chair/bed transfer assist level: Minimal Assistance - Patient > 75%     Locomotion Ambulation   Ambulation assist      Assist level: Minimal Assistance - Patient > 75% Assistive device: Walker-rolling Max distance: 66ft   Walk 10 feet activity   Assist     Assist level: Minimal Assistance - Patient > 75% Assistive device: Walker-rolling   Walk 50 feet activity   Assist Walk 50 feet with 2 turns activity did not occur: Safety/medical concerns  Assist level: Minimal Assistance - Patient > 75% Assistive device: Walker-rolling    Walk 150 feet activity   Assist Walk 150 feet activity did not occur: Safety/medical concerns         Walk 10 feet on uneven surface  activity   Assist Walk 10 feet on uneven surfaces activity did not occur: Safety/medical concerns  Wheelchair     Assist Will patient use wheelchair at discharge?: (TBD)             Wheelchair 50 feet with 2 turns activity    Assist            Wheelchair 150 feet activity     Assist            Medical Problem List and Plan: 1.  Altered mental status left hemiparesis secondary to right MCA infarction along with a small left CR and splenium infarct embolic pattern likely due to transient  cardiomyopathy with low EF after cardiac catheterization for non-STEMI with stenting complicated by groin hematoma  Continue CIR PT, OT, SLP  Repeat CT unremarkable for changes 2.  Antithrombotics: -DVT/anticoagulation: SCDs             -antiplatelet therapy: Plavix 3. Pain Management: Tylenol as needed  Voltaren gel to bilateral knees ordered on 4/27, with improvement  Controlled with intervention on 5/1 4. Mood: Amantadine 100 mg twice daily, DC'd secondary to hallucinations- son is requesting therapist to see pt, will ask Neuropsych follow up   Melatonin 3 mg nightly             -antipsychotic agents: agitation resolved d/c haldol 5. Neuropsych: This patient is not capable of making decisions on her own behalf. 6. Skin/Wound Care: Routine skin checks 7. Fluids/Electrolytes/Nutrition: Routine in and outs.   BMP within acceptable range on 4/28 8.  GI bleed/duodenal ulcer.  Status post EGD 10/08/2019 with epi and hemostasis clips x5.  PPI twice daily             See #9 9.  Acute blood loss anemia.    Hemoglobin 11.0 on 4/24, labs ordered for tomorrow  Continue to monitor 10.  AKI.  Resolved.  11.  Acute urinary tract infection.  Patient completed 5-day course amoxicillin 10/17/2019 12.  Post stroke dysphagia.  Dysphagia #2 thins liquids.  Follow-up speech therapy             Advance diet as tolerated  Plan to transition to current modified diet after ability to consume regular textures 13.  Keratoconjunctivitis.  Follow-up ophthalmology services.  Completed 7-day course of erythromycin drops on 4/17. 14.  Hypertension.     Increased Coreg to 6.25 mg BID with meals  Discussed with pharmacy, started on Losartan 12.5 on 4/22, increased to 25 mg, increased to 50 on 4/28, increased to 75 on 5/1 with eventual plans for Entresto  Labile and trending up on 5/2 15.  Hyperlipidemia.  Lipitor 16.  Prediabetes.  Hemoglobin A1c 5.9.  SSI CBG (last 3)  Recent Labs    11/03/19 1659 11/03/19 2117  11/04/19 0628  GLUCAP 154* 208* 158*   Labile on 5/3  Discussed CBG with son, who is at bedside  Borderline elevated HgbA1c 5.9 on 3/25  Recieves 8-9 U novalog per day will start metformin, monitor creat  17. Acute systolic CHF   Filed Weights   11/02/19 0335 11/03/19 0429 11/04/19 0400  Weight: 67.7 kg 68 kg 67.3 kg   Stable on 5/3 18. Hypomagnesemia  Mag 1.6 on 4/30, labs ordered for tomorrow  Supplemented IV on 4/19, again on 4/23, again on 4/27  Oral supplementation initiated, increased on 4/30 19.  Hypokalemia  Potassium 3.7 on 4/23 after supplementation 20. Mod/Severe Hypoalbuminemia  Supplement initiated 21.  Urinary frequency  PVRs ordered, only performed x1 with minimal residual  UA unremarkable, urine culture minimal growth, repeat  UA ordered   Keflex course completed on 4/29  Enablex started on 4/30, increased on 5/2  LOS: 19 days A FACE TO Brittney Kennedy 11/04/2019, 10:09 AM

## 2019-11-05 ENCOUNTER — Inpatient Hospital Stay (HOSPITAL_COMMUNITY): Payer: PRIVATE HEALTH INSURANCE | Admitting: Speech Pathology

## 2019-11-05 ENCOUNTER — Inpatient Hospital Stay (HOSPITAL_COMMUNITY): Payer: PRIVATE HEALTH INSURANCE | Admitting: Occupational Therapy

## 2019-11-05 ENCOUNTER — Inpatient Hospital Stay (HOSPITAL_COMMUNITY): Payer: PRIVATE HEALTH INSURANCE | Admitting: Physical Therapy

## 2019-11-05 LAB — GLUCOSE, CAPILLARY
Glucose-Capillary: 164 mg/dL — ABNORMAL HIGH (ref 70–99)
Glucose-Capillary: 129 mg/dL — ABNORMAL HIGH (ref 70–99)
Glucose-Capillary: 169 mg/dL — ABNORMAL HIGH (ref 70–99)
Glucose-Capillary: 189 mg/dL — ABNORMAL HIGH (ref 70–99)

## 2019-11-05 NOTE — Progress Notes (Signed)
Speech Language Pathology Daily Session Note  Patient Details  Name: Brittney Kennedy MRN: [id number] Date of Birth: Sep 04, 1950  Today's Date: 11/05/2019 SLP Individual Time: 2595-6387 SLP Individual Time Calculation (min): 43 min  Short Term Goals: Week 3: SLP Short Term Goal 1 (Week 3): Pt will sustain attention to basic familiar task for ~ 5 minutes with Min A cues. SLP Short Term Goal 2 (Week 3): Provided Min A cues, pt will utilize speech intelligibilty strategies to achieve 90% intelligibility at sentence level. SLP Short Term Goal 3 (Week 3): Pt will demonstrate ability to problem solve in functional basic situations with Mod A mutimodal cues. SLP Short Term Goal 4 (Week 3): Pt will recall daily and/or new information with Mod A cues for use of compensatory memory strategies. SLP Short Term Goal 5 (Week 3): Pt will demonstrate efficient mastication and oral clearance with Min A cues during upgraded trials of Dysphaiga 3 solids prior to upgrade. SLP Short Term Goal 6 (Week 3): Pt will demonstrate ability to use her compensatory maneuver with thin liquids (left head turn) with Min A verbal cues.  Skilled Therapeutic Interventions: Pt was seen for skilled ST targeting dysphagia and cognition. Pt used left head turn strategy with thin liquid intake with overall Min A verbal cues in a very quiet controlled environment. Reducing distractions is key for pt's ability to use this strategy with more independence. No overt s/sx aspiration observed. Recommend continue current diet.  Pt required Mod A verbal and visual cues to scan left and problem solve during a novel semi-complex card task. She also required Mod A verbal cues for recall throughout this task. She sustained attention with overall Mod A cues for redirection. Pt returned to room and left sitting in chair with family members present, seatbelt alarm on and needs met. Continue per current plan of care.          Pain Pain Assessment Pain  Scale: Faces Faces Pain Scale: No hurt   Therapy/Group: Individual Therapy  Arbutus Leas 11/05/2019, 7:07 AM

## 2019-11-05 NOTE — Progress Notes (Signed)
[place] PHYSICAL MEDICINE & REHABILITATION PROGRESS NOTE  Subjective/Complaints: Patient seen laying in bed this morning.  He indicates she slept well overnight.  Remains sleepy.  No reported issues overnight.  ROS: Denies left shoulder pain, CP, shortness of breath, nausea, vomiting, diarrhea.  Objective: Vital Signs: Blood pressure (!) 142/60, pulse 85, temperature 97.9 F (36.6 C), resp. rate 18, height 5\' 2"  (1.575 m), weight 67.1 kg, SpO2 97 %. No results found. No results for input(s): WBC, HGB, HCT, PLT in the last 72 hours. No results for input(s): NA, K, CL, CO2, GLUCOSE, BUN, CREATININE, CALCIUM in the last 72 hours.  Physical Exam: BP (!) 142/60 (BP Location: Right Arm)   Pulse 85   Temp 97.9 F (36.6 C)   Resp 18   Ht 5\' 2"  (1.575 m)   Wt 67.1 kg   SpO2 97%   BMI 27.07 kg/m  Constitutional: No distress . Vital signs reviewed. HENT: Normocephalic.  Atraumatic. Eyes: EOMI. No discharge. Cardiovascular: No JVD. Respiratory: Normal effort.  No stridor. GI: Non-distended. Skin: Warm and dry.  Intact. Psych: Normal mood.  Normal behavior. Musc: No edema in extremities.  No tenderness in extremities. Neuro: Alert Motor:  LUE: Shoulder abduction 3/5, distally 4/5 LLE: 4-/5 proximal to distal (?  Some pain inhibition), appears unchanged  Assessment/Plan: 1. Functional deficits secondary to bilateral embolic infarcts.  Which require 3+ hours per day of interdisciplinary therapy in a comprehensive inpatient rehab setting.  Physiatrist is providing close team supervision and 24 hour management of active medical problems listed below.  Physiatrist and rehab team continue to assess barriers to discharge/monitor patient progress toward functional and medical goals  Care Tool:  Bathing    Body parts bathed by patient: Face, Chest, Abdomen, Front perineal area, Right upper leg, Left upper leg, Right lower leg, Left lower leg, Left arm   Body parts bathed by helper:  Right arm, Buttocks Body parts n/a: Right arm, Left arm   Bathing assist Assist Level: Minimal Assistance - Patient > 75%     Upper Body Dressing/Undressing Upper body dressing   What is the patient wearing?: Pull over shirt    Upper body assist Assist Level: Supervision/Verbal cueing    Lower Body Dressing/Undressing Lower body dressing      What is the patient wearing?: Pants, Incontinence brief     Lower body assist Assist for lower body dressing: Moderate Assistance - Patient 50 - 74%     Toileting Toileting    Toileting assist Assist for toileting: Moderate Assistance - Patient 50 - 74%     Transfers Chair/bed transfer  Transfers assist     Chair/bed transfer assist level: Minimal Assistance - Patient > 75%     Locomotion Ambulation   Ambulation assist      Assist level: Minimal Assistance - Patient > 75% Assistive device: Walker-rolling Max distance: 100'   Walk 10 feet activity   Assist     Assist level: Minimal Assistance - Patient > 75% Assistive device: Walker-rolling   Walk 50 feet activity   Assist Walk 50 feet with 2 turns activity did not occur: Safety/medical concerns  Assist level: Minimal Assistance - Patient > 75% Assistive device: Walker-rolling    Walk 150 feet activity   Assist Walk 150 feet activity did not occur: Safety/medical concerns         Walk 10 feet on uneven surface  activity   Assist Walk 10 feet on uneven surfaces activity did not occur: Safety/medical concerns  Wheelchair     Assist Will patient use wheelchair at discharge?: (TBD)             Wheelchair 50 feet with 2 turns activity    Assist            Wheelchair 150 feet activity     Assist            Medical Problem List and Plan: 1.  Altered mental status left hemiparesis secondary to right MCA infarction along with a small left CR and splenium infarct embolic pattern likely due to transient  cardiomyopathy with low EF after cardiac catheterization for non-STEMI with stenting complicated by groin hematoma  Continue CIR  Repeat CT unremarkable for changes 2.  Antithrombotics: -DVT/anticoagulation: SCDs             -antiplatelet therapy: Plavix 3. Pain Management: Tylenol as needed  Voltaren gel to bilateral knees ordered on 4/27, with improvement  Controlled with intervention on 5/4 4. Mood: Amantadine 100 mg twice daily, DC'd secondary to hallucinations- son is requesting therapist to see pt, will ask Neuropsych follow up   Melatonin 3 mg nightly             -antipsychotic agents: agitation resolved d/c haldol 5. Neuropsych: This patient is not capable of making decisions on her own behalf. 6. Skin/Wound Care: Routine skin checks 7. Fluids/Electrolytes/Nutrition: Routine in and outs.   BMP within acceptable range on 4/28 8.  GI bleed/duodenal ulcer.  Status post EGD 10/08/2019 with epi and hemostasis clips x5.  PPI twice daily             See #9 9.  Acute blood loss anemia.    Hemoglobin 11.0 on 4/24, will order labs for later this week  Continue to monitor 10.  AKI.  Resolved.  11.  Acute urinary tract infection.  Patient completed 5-day course amoxicillin 10/17/2019 12.  Post stroke dysphagia.  Dysphagia #2 thins liquids.  Follow-up speech therapy             Advance diet as tolerated  Plan to transition to carb modified diet after ability to consume regular textures 13.  Keratoconjunctivitis.  Follow-up ophthalmology services.  Completed 7-day course of erythromycin drops on 4/17. 14.  Hypertension.     Increased Coreg to 6.25 mg BID with meals  Discussed with pharmacy, started on Losartan 12.5 on 4/22, increased to 25 mg, increased to 50 on 4/28, increased to 75 on 5/1 with eventual plans for Entresto  Elevated on 5/4 15.  Hyperlipidemia.  Lipitor 16.  Prediabetes.  Hemoglobin A1c 5.9.  SSI  Metformin 500 daily started on 5/4 CBG (last 3)  Recent Labs     11/04/19 1644 11/04/19 2122 11/05/19 0625  GLUCAP 122* 198* 164*   Labile on 5/4  Plan to transition to carb modified diet when advanced to regular textures 17. Acute systolic CHF   Filed Weights   11/03/19 0429 11/04/19 0400 11/05/19 0617  Weight: 68 kg 67.3 kg 67.1 kg   Stable on 5/4 18. Hypomagnesemia  Mag 1.6 on 4/30, labs ordered for tomorrow  Supplemented IV on 4/19, again on 4/23, again on 4/27  Oral supplementation initiated, increased on 4/30 19.  Hypokalemia  Potassium 4.0 on 4/28  20. Mod/Severe Hypoalbuminemia  Supplement initiated 21.  Urinary frequency  PVRs ordered, only performed x1 with minimal residual  UA unremarkable, urine culture minimal growth, repeat UA relatively unremarkable   Keflex course completed on 4/29  Enablex started  on 4/30, increased on 5/2  Improving on 5/4  LOS: 20 days A FACE TO FACE EVALUATION WAS PERFORMED  Marquee Fuchs Karis Juba 11/05/2019, 9:37 AM

## 2019-11-05 NOTE — Progress Notes (Signed)
Occupational Therapy Session Note  Patient Details  Name: Brittney Kennedy MRN: [id number] Date of Birth: 11-Apr-1951  Today's Date: 11/05/2019 OT Individual Time: 1497-0263 OT Individual Time Calculation (min): 69 min    Short Term Goals: Week 1:  OT Short Term Goal 1 (Week 1): Pt will be able to use L hand as a stabilizing A with UB bathing. OT Short Term Goal 1 - Progress (Week 1): Progressing toward goal OT Short Term Goal 2 (Week 1): Pt will be able to find items on her food tray on L side with min cues. OT Short Term Goal 2 - Progress (Week 1): Met OT Short Term Goal 3 (Week 1): Pt will complete squat pivot to toilet with mod A. OT Short Term Goal 3 - Progress (Week 1): Met OT Short Term Goal 4 (Week 1): Pt will don a shirt with mod A. OT Short Term Goal 4 - Progress (Week 1): Met OT Short Term Goal 5 (Week 1): Pt will rise to stand with mod A. OT Short Term Goal 5 - Progress (Week 1): Met Week 2:  OT Short Term Goal 1 (Week 2): Pt will demonstrate improved attention and ability to follow directions to don shirt with verbal cues and set up. OT Short Term Goal 1 - Progress (Week 2): Progressing toward goal OT Short Term Goal 2 (Week 2): Pt will don pants over L foot with steadying A. OT Short Term Goal 2 - Progress (Week 2): Met OT Short Term Goal 3 (Week 2): Pt will use L hand as a stabilizing A when opening/closing containers. OT Short Term Goal 3 - Progress (Week 2): Met OT Short Term Goal 4 (Week 2): Pt will be able to lift L shoulder to 45 degrees abduction to be able to wash under arm pit easily. OT Short Term Goal 4 - Progress (Week 2): Met Week 3:  OT Short Term Goal 1 (Week 3): Pt will be able to complete a toilet transfer with CGA. OT Short Term Goal 2 (Week 3): Pt will complete toileting with CGA. OT Short Term Goal 3 (Week 3): Pt will bathe in shower with CGA only when standing demonstrating improved LUE motor control. OT Short Term Goal 4 (Week 3): Pt will demonstrate  improved L shoulder AROM and movement to don shirt with S only.  Skilled Therapeutic Interventions/Progress Updates:    Pt seen this session for ADL training and initiation of family education with son and her daughter in law.  Discussed with them her progress and the main areas she continues to need to work on (L visual scanning, attention, endurance, LUE functional use).  Her progress with her LLE and ability to stand and ambulate have improved a great deal.   Demonstrated to family: Safe self ROM of LUE (not to force her arm above 90 degrees)  Sit to stands to RW Ambulation to bathroom Toilet transfers and toileting Transfers into walk in shower (pt will need to practice stepping over a threshold Bathing on shower bench with sit to stands and use of L hand as active A Dressing with hemidressing techniques Stand pivot back to bed  Pt continues to need cues with LLE placement awareness with standing up and taking steps and when she fatigues as she will allow that L knee to "buckle".  Pt did well with L visual scanning and only needed a few cues to scan to her left.    Pt very tired at end of session.  Transferred to  bed and adjusted in bed with all needs met. Bed alarm set.    Therapy Documentation Precautions:  Precautions Precautions: Fall, Other (comment) Precaution Comments: aspiration precautions (HOB >30 degrees), significant L inattention/neglect, signifcant L hemiparesis (UE>LE with L shoulder subluxation) Restrictions Weight Bearing Restrictions: No General:   Vital Signs: Therapy Vitals Temp: 97.8 F (36.6 C) Pulse Rate: 83 Resp: 18 BP: (!) 109/39 Patient Position (if appropriate): Lying Oxygen Therapy SpO2: 99 % O2 Device: Room Air Pain: Pain Assessment Pain Scale: Faces Pain Score: 5  Faces Pain Scale: No hurt Pain Type: Chronic pain Pain Location: Hip Pain Orientation: Right Pain Descriptors / Indicators: Aching;Sore Pain Onset: On-going Patients Stated  Pain Goal: 0 Pain Intervention(s): Repositioned;Ambulation/increased activity;Environmental changes;Emotional support ADL: ADL Grooming: Supervision/safety Where Assessed-Grooming: Sitting at sink Upper Body Bathing: Minimal assistance Where Assessed-Upper Body Bathing: Shower Lower Body Bathing: Minimal assistance Where Assessed-Lower Body Bathing: Shower Upper Body Dressing: Minimal assistance Where Assessed-Upper Body Dressing: Wheelchair Lower Body Dressing: Moderate assistance Where Assessed-Lower Body Dressing: Wheelchair Toileting: Moderate assistance Where Assessed-Toileting: Glass blower/designer: Psychiatric nurse Method: Counselling psychologist: Grab bars, Raised toilet seat Social research officer, government: Minimal assistance Social research officer, government Method: Heritage manager: Shower seat with back, Grab bars    Therapy/Group: Individual Therapy  [place] 11/05/2019, 2:55 PM

## 2019-11-05 NOTE — Progress Notes (Signed)
Physical Therapy Session Note  Patient Details  Name: Brittney Kennedy MRN: [id number] Date of Birth: 21-Jan-1951  Today's Date: 11/05/2019 PT Individual Time: 6213-0865 PT Individual Time Calculation (min): 44 min   Short Term Goals: Week 3:  PT Short Term Goal 1 (Week 3): Pt will perform supine<>sit with CGA PT Short Term Goal 2 (Week 3): Pt will perform sit<>stand using LRAD with CGA PT Short Term Goal 3 (Week 3): Pt will perform bed<>chair transfers using LRAD with CGA PT Short Term Goal 4 (Week 3): Pt will ambulate at least 171ft using LRAD with min assist of 1  Skilled Therapeutic Interventions/Progress Updates:    Pt received R sidelying in bed asleep but awakens and agreeable to therapy session. Supine>sitting L EOB with mod assist for trunk upright due to poor trunk control and pt drowsy. Sit<>stands using RW with CGA/min assist for balance throughout session - continued cuing for increased L weight shift and L LE WBing. Gait training ~43ft using RW with min/mod assist for balance and AD management as pt continues to be drowsy - demonstrates decreased L LE step length and foot clearance during swing and intermittent L knee give during stance as well as poor AD management and trunk upright.  Transported to/from gym in w/c for time management and energy conservation. Gait training 53ft with L UE support on therapist and min/mod assist for balance - max cuing for L LE attention and increased step length and foot clearance which improved from earlier though continues to have intermittent L knee giving into flexion during stance though not buckling. Standing L LE NMR targeting sustained L quad contraction via stepping R LE up/down on 4" step with min/mod assist for balance - demonstrates L hip abductor weakness. Seated EOM performed hip abduction x12 reps against level 1 theraband resistance. Progressed to repeated sit<>stands with theraband for increased hip abductor activation x10reps with min  assist, B UE support on therapist. Sitting in w/c>tall kneeling on mat table with B UE support on bench with mod assist for balance and L LE management. In tall kneeling with mod assist performed L UE NMR and trunk control task of grasping cup from mat and placing on bench required assist for L UE management and repeated cuing for increased hip extension. Tall kneeling>lying on mat with mod assist. Stand pivot to w/c with min assist. Transported back to room and left seated with needs in reach and seat belt alarm on  Therapy Documentation Precautions:  Precautions Precautions: Fall, Other (comment) Precaution Comments: aspiration precautions (HOB >30 degrees), significant L inattention/neglect, signifcant L hemiparesis (UE>LE with L shoulder subluxation) Restrictions Weight Bearing Restrictions: No  Pain: Reports R hip pain stating she can tell that it is raining, unrated - provided seated rest breaks, repositioning, emotional support, and distraction for pain management.    Therapy/Group: Individual Therapy  Ginny Forth, PT, DPT 11/05/2019, 4:01 PM

## 2019-11-05 NOTE — Progress Notes (Signed)
Physical Therapy Session Note  Patient Details  Name: Brittney Kennedy MRN: [id number] Date of Birth: [birth year]  Today's Date: 11/05/2019 PT Individual Time: 4628-6381 PT Individual Time Calculation (min): 73 min   Short Term Goals: Week 3:  PT Short Term Goal 1 (Week 3): Pt will perform supine<>sit with CGA PT Short Term Goal 2 (Week 3): Pt will perform sit<>stand using LRAD with CGA PT Short Term Goal 3 (Week 3): Pt will perform bed<>chair transfers using LRAD with CGA PT Short Term Goal 4 (Week 3): Pt will ambulate at least 12f using LRAD with min assist of 1  Skilled Therapeutic Interventions/Progress Updates:    [facility] and [facility]; son DMarden Noblebrought SSunday Spillerstoday to observe therapy and was able to interpret for uKoreatoday as well so started the process of some informal family education. Patient generally able to perform all sit<->stand and stand pivot transfers with MinA/RW with cues for attention to L side and improved step length L foot, required MinA for maintaining balance while donning brief and shorts in standing however. Continues to have R hip pain but tolerated more gait training today- able to gait train distances of 131f [id number] RW and MinA with cues for step length and attention to obstacles on L side this morning. [facility] teeth with cues for attention to L side and use of L UE as able, also worked on L attention activity in standing by tossing beanbags at basket with both beanbags and basket on L side and with use of both hands for tossing beanbags. Otherwise spent time educating DougMarden Noble SylvSunday Spillerssafety and technique for functional transfers, gait, and WC management, also general safety techniques with patient- will f/u tomorrow and allow for hands on time with formal interpreter present. [facility] all needs met, seatbelt alarm active and family present.   Therapy Documentation Precautions:   Precautions Precautions: Fall, Other (comment) Precaution Comments: aspiration precautions (HOB >30 degrees), significant L inattention/neglect, signifcant L hemiparesis (UE>LE with L shoulder subluxation) Restrictions Weight Bearing Restrictions: No Pain: Pain Assessment Pain Scale: 0-10 Pain Score: 5  Pain Type: Chronic pain Pain Location: Hip Pain Orientation: Right Pain Descriptors / Indicators: Aching;Sore Pain Onset: On-going Patients Stated Pain Goal: 0 Pain Intervention(s): Repositioned;Ambulation/increased activity;Environmental changes;Emotional support    Therapy/Group: Individual Therapy   KrisWindell NorfolkT, PN1   Supplemental Physical Therapist ConeMaribelPager [phone] Rehab Office [phone]/10/2019, 12:17 PM
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
## 2019-11-05 NOTE — Plan of Care (Signed)
  Problem: Consults Goal: RH STROKE PATIENT EDUCATION Description: See Patient Education module for education specifics  Outcome: Progressing Goal: Nutrition Consult-if indicated Outcome: Progressing   Problem: RH BOWEL ELIMINATION Goal: RH STG MANAGE BOWEL WITH ASSISTANCE Description: STG Manage Bowel with mod I Assistance. Outcome: Progressing Goal: RH STG MANAGE BOWEL W/MEDICATION W/ASSISTANCE Description: STG Manage Bowel with Medication with mod I Assistance. Outcome: Progressing

## 2019-11-06 ENCOUNTER — Ambulatory Visit (HOSPITAL_COMMUNITY): Payer: PRIVATE HEALTH INSURANCE | Admitting: Physical Therapy

## 2019-11-06 ENCOUNTER — Inpatient Hospital Stay (HOSPITAL_COMMUNITY): Payer: PRIVATE HEALTH INSURANCE | Admitting: Physical Therapy

## 2019-11-06 ENCOUNTER — Encounter (HOSPITAL_COMMUNITY): Payer: PRIVATE HEALTH INSURANCE | Admitting: Occupational Therapy

## 2019-11-06 ENCOUNTER — Encounter (HOSPITAL_COMMUNITY): Payer: PRIVATE HEALTH INSURANCE | Admitting: Speech Pathology

## 2019-11-06 ENCOUNTER — Inpatient Hospital Stay (HOSPITAL_COMMUNITY): Payer: PRIVATE HEALTH INSURANCE | Admitting: *Deleted

## 2019-11-06 DIAGNOSIS — N319 Neuromuscular dysfunction of bladder, unspecified: Secondary | ICD-10-CM

## 2019-11-06 LAB — GLUCOSE, CAPILLARY
Glucose-Capillary: 158 mg/dL — ABNORMAL HIGH (ref 70–99)
Glucose-Capillary: 206 mg/dL — ABNORMAL HIGH (ref 70–99)
Glucose-Capillary: 110 mg/dL — ABNORMAL HIGH (ref 70–99)
Glucose-Capillary: 144 mg/dL — ABNORMAL HIGH (ref 70–99)

## 2019-11-06 LAB — MAGNESIUM: Magnesium: 1.5 mg/dL — ABNORMAL LOW (ref 1.7–2.4)

## 2019-11-06 MED ORDER — MAGNESIUM GLUCONATE 500 MG PO TABS
500.0000 mg | ORAL_TABLET | Freq: Three times a day (TID) | ORAL | Status: DC
  Administered 2019-11-06 – 2019-11-13 (×20): 500 mg via ORAL
  Filled 2019-11-06 (×22): qty 1

## 2019-11-06 MED ORDER — TAMSULOSIN HCL 0.4 MG PO CAPS
0.4000 mg | ORAL_CAPSULE | Freq: Every day | ORAL | Status: DC
  Administered 2019-11-06 – 2019-11-13 (×8): 0.4 mg via ORAL
  Filled 2019-11-06 (×8): qty 1

## 2019-11-06 MED ORDER — MAGNESIUM SULFATE 4 GM/100ML IV SOLN
4.0000 g | Freq: Once | INTRAVENOUS | Status: AC
  Administered 2019-11-06: 13:00:00 4 g via INTRAVENOUS
  Filled 2019-11-06: qty 100

## 2019-11-06 NOTE — Progress Notes (Signed)
Nutrition Follow-up  DOCUMENTATION CODES:   Not applicable  INTERVENTION:   - Continue Boost Breeze po BID, each supplement provides 250 kcal and 9 grams of protein  - Continue Pro-stat 30 ml po BID, each supplement provides 100 kcal and 15 grams of protein  - MVI with minerals daily  - Continue Vital Cuisine Shake TID, each supplement provides 520 kcal and 22 grams of protein (vanilla flavor)  NUTRITION DIAGNOSIS:   Increased nutrient needs related to other (therapies) as evidenced by estimated needs.  Ongoing  GOAL:   Patient will meet greater than or equal to 90% of their needs  Progressing  MONITOR:   PO intake, Supplement acceptance, Weight trends, Labs, Diet advancement  REASON FOR ASSESSMENT:   Malnutrition Screening Tool    ASSESSMENT:   69 yo female admitted with NSTEMI, acute CHF and later had a change in mental status and found to have acute stroke. PMH includes PUD, seasonal allergies, cholecystectomy and left rotator cuff repair.  3/26 - cardiac cath, new onset CHF 3/28 - rapid response due to lethargy, slurred speech, CT head no acute findings 3/29 - CVA: R basal infarct, small R pontine infarct 3/30 - CT acute infact centered in R parietal lobe 4/03 - MBS, dysphagia 1, nectar-thick liquids 4/06 - rapid response due to hypotension and dark tarry liquid stools; s/p EGD 4/14 - admit to CIR 4/27 - MBS with recommendations for Dysphagia 2 diet with thin liquids  Admission weight: 70.4 kg Current weight: 67.1 kg  Overall pt's PO intake has improved. Most meals are >50% completion. Pt accepting 100% of Pro-stat supplements and about 50% of Boost Breeze supplements. Will continue with current regimen.  Attempted to speak with pt at bedside. However, pt was sleeping and family member preferred that RD not wake pt.  Meal Completion: 25-100% x last 8 meals (averaging 70%)  Medications reviewed and include: colace, Boost Breeze BID, Pro-stat BID, SSI,  magnesium gluconate, Metformin, MVI with minerals, protonix, IV magnesium sulfate 4 grams once  Labs reviewed: magnesium 1.5 CBG's: 129-189 x 24 hours  Diet Order:   Diet Order            DIET DYS 2 Room service appropriate? Yes; Fluid consistency: Thin  Diet effective now              EDUCATION NEEDS:   Not appropriate for education at this time  Skin:  Skin Assessment: Reviewed RN Assessment  Last BM:  11/05/19  Height:   Ht Readings from Last 1 Encounters:  10/16/19 5\' 2"  (1.575 m)    Weight:   Wt Readings from Last 1 Encounters:  11/05/19 67.1 kg    BMI:  Body mass index is 27.07 kg/m.  Estimated Nutritional Needs:   Kcal:  1500-1700  Protein:  75-85 grams  Fluid:  >/= 1.5 L    01/05/20, MS, RD, LDN Inpatient Clinical Dietitian Pager: [phone] Weekend/After Hours: [phone]

## 2019-11-06 NOTE — Progress Notes (Signed)
Recreational Therapy Session Note  Patient Details  Name: Brittney Kennedy MRN: [id number] Date of Birth: 06/04/1951 Today's Date: 11/06/2019 Time:  1405-1430 Pain: no c/o Skilled Therapeutic Interventions/Progress Updates: Pt sat EOB with supervision for table craft painting a rock for the Heart & Stroke Walk.  Pt scanned to the left with min cues to locate paint options.  Pt utilized LUE as gross assist holding paint bottle while opening the top with RUE with min assist. Pt talkative throughout, needing min questioning cues for intelligeable speech.  Therapy/Group: Co-Treatment  Raima Geathers 11/06/2019, 3:49 PM

## 2019-11-06 NOTE — Plan of Care (Signed)
  Problem: Consults Goal: RH STROKE PATIENT EDUCATION Description: See Patient Education module for education specifics  Outcome: Progressing Goal: Nutrition Consult-if indicated Outcome: Progressing   Problem: RH BOWEL ELIMINATION Goal: RH STG MANAGE BOWEL WITH ASSISTANCE Description: STG Manage Bowel with mod I Assistance. Outcome: Progressing Goal: RH STG MANAGE BOWEL W/MEDICATION W/ASSISTANCE Description: STG Manage Bowel with Medication with mod I Assistance. Outcome: Progressing   Problem: RH BLADDER ELIMINATION Goal: RH STG MANAGE BLADDER WITH ASSISTANCE Description: STG Manage Bladder With mod I Assistance Outcome: Progressing   Problem: RH SKIN INTEGRITY Goal: RH STG SKIN FREE OF INFECTION/BREAKDOWN Description: Patient will not have new areas of skin break down  Outcome: Progressing Goal: RH STG MAINTAIN SKIN INTEGRITY WITH ASSISTANCE Description: STG Maintain Skin Integrity With mod I Assistance. Outcome: Progressing   Problem: RH SAFETY Goal: RH STG ADHERE TO SAFETY PRECAUTIONS W/ASSISTANCE/DEVICE Description: STG Adhere to Safety Precautions With Assistance/Device. Outcome: Progressing   Problem: RH COGNITION-NURSING Goal: RH STG USES MEMORY AIDS/STRATEGIES W/ASSIST TO PROBLEM SOLVE Description: STG Uses Memory Aids/Strategies With Assistance to Problem Solve. Outcome: Progressing Goal: RH STG ANTICIPATES NEEDS/CALLS FOR ASSIST W/ASSIST/CUES Description: STG Anticipates Needs/Calls for Assist With Assistance/Cues. Outcome: Progressing   Problem: RH PAIN MANAGEMENT Goal: RH STG PAIN MANAGED AT OR BELOW PT'S PAIN GOAL Description: Pain scale <4/10 Outcome: Progressing   Problem: RH KNOWLEDGE DEFICIT Goal: RH STG INCREASE KNOWLEDGE OF HYPERTENSION Description: Patient will maintain SBP less than 150 Outcome: Progressing Goal: RH STG INCREASE KNOWLEDGE OF DYSPHAGIA/FLUID INTAKE Description: Patient will tolerate full diet with thin liquids Outcome:  Progressing Goal: RH STG INCREASE KNOWLEDGE OF STROKE PROPHYLAXIS Description: Patient will adhere to medication regimen and dietary modifications Outcome: Progressing   Problem: Consults Goal: RH GENERAL PATIENT EDUCATION Description: See Patient Education module for education specifics. Outcome: Progressing   Problem: RH Vision Goal: RH LTG Vision (Specify) Outcome: Progressing   

## 2019-11-06 NOTE — Progress Notes (Signed)
Speech Language Pathology Daily Session Note  Patient Details  Name: Brittney Kennedy No MRN: [id number] Date of Birth: 1951/02/03  Today's Date: 11/06/2019 SLP Individual Time: 0930-1000 SLP Individual Time Calculation (min): 30 min  Short Term Goals: Week 3: SLP Short Term Goal 1 (Week 3): Pt will sustain attention to basic familiar task for ~ 5 minutes with Min A cues. SLP Short Term Goal 2 (Week 3): Provided Min A cues, pt will utilize speech intelligibilty strategies to achieve 90% intelligibility at sentence level. SLP Short Term Goal 3 (Week 3): Pt will demonstrate ability to problem solve in functional basic situations with Mod A mutimodal cues. SLP Short Term Goal 4 (Week 3): Pt will recall daily and/or new information with Mod A cues for use of compensatory memory strategies. SLP Short Term Goal 5 (Week 3): Pt will demonstrate efficient mastication and oral clearance with Min A cues during upgraded trials of Dysphaiga 3 solids prior to upgrade. SLP Short Term Goal 6 (Week 3): Pt will demonstrate ability to use her compensatory maneuver with thin liquids (left head turn) with Min A verbal cues.  Skilled Therapeutic Interventions: Pt was seen for skilled ST targeting education with pt's family who will be caregivers at discharge. Spanish interpretor was present throughout session to assist with translation for pt's spanish speaking family member. SLP provided review of pt's current diet recommendations and specific swallowing strategies for oral clearance (lingual sweep, liquid washes, small bites) as well as to minimize aspiration risk with thins (left head turn). Recommended that pt have full supervision during meals at home based on need for verbal cueing to implement left head turn. Also reviewed pt's dysarthria and strategies she can use to compensate/clarify messages with emphases on slower rate and overarticulation. SLP reviewed pt's current cognitive impairments impacting her daily function  including problem solving, short term memory, sustained attention, and error awareness. Discussed compensatory memory strategies as well as reducing environmental distractions and no multitasking. Family acknowledged recommendation for 24/7 supervision and specifically assistance with cooking, medication and finance management. Also discussed activities pt could safely engage in that would also be cognitively stimulating. Answered all questions to family's satisfaction at this time. Family will be present to observe more sessions prior to d/c. Pt left sitting in chair with family and RN present. Continue per current plan of care.        Pain Pain Assessment Pain Score: 0-No pain  Therapy/Group: Individual Therapy  Brittney Kennedy 11/06/2019, 7:25 AM

## 2019-11-06 NOTE — Progress Notes (Signed)
Occupational Therapy Session Note  Patient Details  Name: Brittney Kennedy MRN: [id number] Date of Birth: Nov 11, 1950  Today's Date: 11/06/2019 OT Individual Time: 1015-1100 and 1400-1450 OT Individual Time Calculation (min): 45 min and 50 min   Short Term Goals: Week 2:  OT Short Term Goal 1 (Week 2): Pt will demonstrate improved attention and ability to follow directions to don shirt with verbal cues and set up. OT Short Term Goal 1 - Progress (Week 2): Progressing toward goal OT Short Term Goal 2 (Week 2): Pt will don pants over L foot with steadying A. OT Short Term Goal 2 - Progress (Week 2): Met OT Short Term Goal 3 (Week 2): Pt will use L hand as a stabilizing A when opening/closing containers. OT Short Term Goal 3 - Progress (Week 2): Met OT Short Term Goal 4 (Week 2): Pt will be able to lift L shoulder to 45 degrees abduction to be able to wash under arm pit easily. OT Short Term Goal 4 - Progress (Week 2): Met Week 3:  OT Short Term Goal 1 (Week 3): Pt will be able to complete a toilet transfer with CGA. OT Short Term Goal 2 (Week 3): Pt will complete toileting with CGA. OT Short Term Goal 3 (Week 3): Pt will bathe in shower with CGA only when standing demonstrating improved LUE motor control. OT Short Term Goal 4 (Week 3): Pt will demonstrate improved L shoulder AROM and movement to don shirt with S only.  Skilled Therapeutic Interventions/Progress Updates:    Visit 1: Pain: c/o L shoulder pain with certain movement patterns  Family education with caregiver, son and interpretor for the caregiver.   Pt received in w/c feeling very lethargic.  Pt did not want to participate much but did agree to change clothing so I could demonstrate to her caregiver hemidressing techniques. With mod verbal cues and CGA, donned shirt, min A for pants. Emphasized sit to stand techniques using B hands to push up versus holding onto walker.  Pt able to rise to stand with CGA and hold balance with CGA as  she pulls her pants up and down.   Explained how to support pt under the axilla versus holding her arm.   IV team arrived to place an IV in her arm.  During this 5 minutes, demonstrated to family how she can step in and out of walk in shower using walker. Son will measure shower size, if that is too small for a chair she could use a tub bench.    Pt very fatigued and wanted to lay down. Pt completed stand pivot transfer with RW with min A to bed.  Demonstrated sit to sidelying techniques and positioning with pillows.  Discussed the need for frequent PROM to L wrist due to developing tightness.    Pt resting in bed with all needs met.   Visit 2: Pain: no c/o pain Pt received in bed connected to IV stating she felt better as she was able to nap.  Pt sat to EOB with minA and maintained sitting balance for 45 min with S as she engaged in visual perceptual tasks. First task was a co tx with recreational therapist on attention, visual scanning and bilateral use of hands with a painting activity.  Pt then worked on copying a Media planner, clock drawing and filling out a form.  Used those activities to demonstrate to pt what areas of her visual perception continue to need work. Pt completed the activities with  approximately 60% accuracy demonstrating L inattention as she kept her drawings in her R visual field.  Pt needed to toilet, used BSC with min A (vs toilet due to IV line) Pt adjusted back to bed to rest for next session. Call light in reach and bed alarm on.   Therapy Documentation Precautions:  Precautions Precautions: Fall, Other (comment) Precaution Comments: aspiration precautions (HOB >30 degrees), significant L inattention/neglect, signifcant L hemiparesis (UE>LE with L shoulder subluxation) Restrictions Weight Bearing Restrictions: No  Pain: Pain Assessment Pain Scale: 0-10 Pain Score: 0-No pain ADL: ADL Grooming: Supervision/safety Where Assessed-Grooming: Sitting at  sink Upper Body Bathing: Minimal assistance Where Assessed-Upper Body Bathing: Shower Lower Body Bathing: Minimal assistance Where Assessed-Lower Body Bathing: Shower Upper Body Dressing: Minimal assistance Where Assessed-Upper Body Dressing: Wheelchair Lower Body Dressing: Moderate assistance Where Assessed-Lower Body Dressing: Wheelchair Toileting: Moderate assistance Where Assessed-Toileting: Glass blower/designer: Psychiatric nurse Method: Counselling psychologist: Grab bars, Raised toilet seat Social research officer, government: Minimal assistance Social research officer, government Method: Heritage manager: Shower seat with back, Grab bars   Therapy/Group: Individual Therapy  [place] 11/06/2019, 10:13 AM

## 2019-11-06 NOTE — Progress Notes (Signed)
Physical Therapy Session Note  Patient Details  Name: Brittney Kennedy MRN: [id number] Date of Birth: 03-Nov-1950  Today's Date: 11/06/2019 PT Individual Time: 4932-4199 PT Individual Time Calculation (min): 60 min   Short Term Goals: Week 3:  PT Short Term Goal 1 (Week 3): Pt will perform supine<>sit with CGA PT Short Term Goal 2 (Week 3): Pt will perform sit<>stand using LRAD with CGA PT Short Term Goal 3 (Week 3): Pt will perform bed<>chair transfers using LRAD with CGA PT Short Term Goal 4 (Week 3): Pt will ambulate at least 170f using LRAD with min assist of 1  Skilled Therapeutic Interventions/Progress Updates:   Pt received supine in bed and agreeable to PT. Supine>sit transfer with supervision assist and moderate cues for improved awareness of LUE/LLE. [facility] and proper LUE use of RW. Pt transported to rehab gym.   Gait training with RW x 1043fwith min assist for safety and moderate cues for visual scanning L and safety in turns to keep COM in RW. Additional dynamic gait training with RW to ambulate forward/reverse 2 x 15 each with mod assist initially progressing to min assist with moderate cues for improved attention to and step length on the L.   Dynamic balance training to performed lateral reaches L to place and return cup to target. Min-mod assist to LUE to improve technique and mechanics of lateral reach and use of biceps to lift onto/off of target.   BLE NMR and endurance training on kinetrain 4 x 1 min with 1 min rest breaks between bouts cues for full ROM on the L and to maintain consistent steps per min.   [facility] returned to [facility] and [facility] reach and all needs met.          Therapy Documentation Precautions:  Precautions Precautions: Fall, Other (comment) Precaution Comments: aspiration precautions (HOB >30 degrees), significant L inattention/neglect, signifcant L hemiparesis (UE>LE with L shoulder  subluxation) Restrictions Weight Bearing Restrictions: No Pain: denies   Therapy/Group: Individual Therapy  AuLorie Phenix/11/2019, 4:46 PM

## 2019-11-06 NOTE — Progress Notes (Signed)
Physical Therapy Session Note  Patient Details  Name: Brittney Kennedy MRN: [id number] Date of Birth: 09/18/1950  Today's Date: 11/06/2019 PT Individual Time: 0835-0930 PT Individual Time Calculation (min): 55 min   Short Term Goals: Week 3:  PT Short Term Goal 1 (Week 3): Pt will perform supine<>sit with CGA PT Short Term Goal 2 (Week 3): Pt will perform sit<>stand using LRAD with CGA PT Short Term Goal 3 (Week 3): Pt will perform bed<>chair transfers using LRAD with CGA PT Short Term Goal 4 (Week 3): Pt will ambulate at least 169ft using LRAD with min assist of 1  Skilled Therapeutic Interventions/Progress Updates:    Patient received in bed asleep, woken easily but upset about not sleeping well last night, required max encouragement but willing to start session early today. Able to complete bed mobility with HOB elevated and min guard, otherwise required min guard for functional transfers today with exception of toilet transfer for which she required MinA for safety. Also required Min-ModA for managing clothing and brief. Spent quite a bit of time on family education with son [name] and daughter-in-law [name] with spanish interpreter facilitating communcation- practiced guarding for functional sit to stand transfers, stand-pivot transfers with RW, guarding for gait, discussed mechanics of and importance of locking WC, hand placement for transfers, and had [place] practice stabilizing patient (with PT acting as patient). Also educated that if patient is having a lot of pain at home, would recommend sticking to transfers only when patient is fatigued or having increased pain. Left up in [facility] with family provideding direct supervision.   Therapy Documentation Precautions:  Precautions Precautions: Fall, Other (comment) Precaution Comments: aspiration precautions (HOB >30 degrees), significant L inattention/neglect, signifcant L hemiparesis (UE>LE with L shoulder subluxation) Restrictions Weight Bearing  Restrictions: No   Pain: Pain Assessment Pain Scale: 0-10 Pain Score: 6  Pain Type: Chronic pain Pain Location: Shoulder Pain Orientation: Left Pain Descriptors / Indicators: Aching;Sharp Pain Onset: On-going Patients Stated Pain Goal: 0 Pain Intervention(s): RN made aware;Medication (See eMAR) Multiple Pain Sites: Yes(R hip- 5/10 pain throbbing, addressed with medicaiton from RN)    Therapy/Group: Individual Therapy  Madelaine Etienne, DPT, PN1   Supplemental Physical Therapist [facility]    Pager [phone] Acute Rehab Office [phone]   11/06/2019, 12:15 PM

## 2019-11-06 NOTE — Progress Notes (Addendum)
[place] PHYSICAL MEDICINE & REHABILITATION PROGRESS NOTE  Subjective/Complaints: Patient seen laying in bed this morning.  She states she did not sleep well overnight due to urinary frequency.  Per nursing no reported issues with sleeping, some urgency, unclear regarding urinary frequency.  ROS: Denies CP, shortness of breath, nausea, vomiting, diarrhea.  Objective: Vital Signs: Blood pressure (!) 139/50, pulse 79, temperature 98.3 F (36.8 C), resp. rate 17, height 5\' 2"  (1.575 m), weight 67.1 kg, SpO2 96 %. No results found. No results for input(s): WBC, HGB, HCT, PLT in the last 72 hours. No results for input(s): NA, K, CL, CO2, GLUCOSE, BUN, CREATININE, CALCIUM in the last 72 hours.  Physical Exam: BP (!) 139/50   Pulse 79   Temp 98.3 F (36.8 C)   Resp 17   Ht 5\' 2"  (1.575 m)   Wt 67.1 kg   SpO2 96%   BMI 27.07 kg/m  Constitutional: No distress . Vital signs reviewed. HENT: Normocephalic.  Atraumatic. Eyes: EOMI. No discharge. Cardiovascular: No JVD. Respiratory: Normal effort.  No stridor. GI: Non-distended. Skin: Warm and dry.  Intact. Psych: Normal mood.  Normal behavior. Musc: No edema in extremities.  No tenderness in extremities. Neuro: Alert Motor:  LUE: Shoulder abduction 3/5, distally 4/5, improving LLE: 4-/5 proximal to distal (?  Some pain inhibition), stable  Assessment/Plan: 1. Functional deficits secondary to bilateral embolic infarcts.  Which require 3+ hours per day of interdisciplinary therapy in a comprehensive inpatient rehab setting.  Physiatrist is providing close team supervision and 24 hour management of active medical problems listed below.  Physiatrist and rehab team continue to assess barriers to discharge/monitor patient progress toward functional and medical goals  Care Tool:  Bathing    Body parts bathed by patient: Face, Chest, Abdomen, Front perineal area, Right upper leg, Left upper leg, Right lower leg, Left lower leg, Left  arm, Right arm, Buttocks   Body parts bathed by helper: Right arm, Buttocks Body parts n/a: Right arm, Left arm   Bathing assist Assist Level: Minimal Assistance - Patient > 75%     Upper Body Dressing/Undressing Upper body dressing   What is the patient wearing?: Pull over shirt    Upper body assist Assist Level: Minimal Assistance - Patient > 75%    Lower Body Dressing/Undressing Lower body dressing      What is the patient wearing?: Pants, Incontinence brief     Lower body assist Assist for lower body dressing: Moderate Assistance - Patient 50 - 74%     Toileting Toileting    Toileting assist Assist for toileting: Moderate Assistance - Patient 50 - 74%     Transfers Chair/bed transfer  Transfers assist     Chair/bed transfer assist level: Minimal Assistance - Patient > 75%     Locomotion Ambulation   Ambulation assist      Assist level: Moderate Assistance - Patient 50 - 74% Assistive device: Walker-rolling Max distance: 15ft   Walk 10 feet activity   Assist     Assist level: Moderate Assistance - Patient - 50 - 74% Assistive device: Walker-rolling   Walk 50 feet activity   Assist Walk 50 feet with 2 turns activity did not occur: Safety/medical concerns  Assist level: Moderate Assistance - Patient - 50 - 74% Assistive device: Walker-rolling    Walk 150 feet activity   Assist Walk 150 feet activity did not occur: Safety/medical concerns         Walk 10 feet on uneven surface  activity   Assist Walk 10 feet on uneven surfaces activity did not occur: Safety/medical concerns         Wheelchair     Assist Will patient use wheelchair at discharge?: (TBD)             Wheelchair 50 feet with 2 turns activity    Assist            Wheelchair 150 feet activity     Assist            Medical Problem List and Plan: 1.  Altered mental status left hemiparesis secondary to right MCA infarction along with a  small left CR and splenium infarct embolic pattern likely due to transient cardiomyopathy with low EF after cardiac catheterization for non-STEMI with stenting complicated by groin hematoma  Continue CIR  Repeat CT unremarkable for changes  Team conference today to discuss current and goals and coordination of care, home and environmental barriers, and discharge planning with nursing, case manager, and therapies.  2.  Antithrombotics: -DVT/anticoagulation: SCDs             -antiplatelet therapy: Plavix 3. Pain Management: Tylenol as needed  Voltaren gel to bilateral knees ordered on 4/27, with improvement  Voltaren gel added to left shoulder 4. Mood: Amantadine 100 mg twice daily, DC'd secondary to hallucinations- son is requesting therapist to see pt, will ask Neuropsych follow up   Melatonin 3 mg nightly             -antipsychotic agents: agitation resolved d/c haldol 5. Neuropsych: This patient is not capable of making decisions on her own behalf. 6. Skin/Wound Care: Routine skin checks 7. Fluids/Electrolytes/Nutrition: Routine in and outs.   BMP within acceptable range on 4/28 8.  GI bleed/duodenal ulcer.  Status post EGD 10/08/2019 with epi and hemostasis clips x5.  PPI twice daily             See #9 9.  Acute blood loss anemia.    Hemoglobin 11.0 on 4/24, will order labs for later this week  Continue to monitor 10.  AKI.  Resolved.  11.  Acute urinary tract infection.  Patient completed 5-day course amoxicillin 10/17/2019 12.  Post stroke dysphagia.  Dysphagia #2 thins liquids.  Follow-up speech therapy             Advance diet as tolerated  Plan to transition to carb modified diet after ability to consume regular textures 13.  Keratoconjunctivitis.  Follow-up ophthalmology services.  Completed 7-day course of erythromycin drops on 4/17. 14.  Hypertension.     Increased Coreg to 6.25 mg BID with meals  Discussed with pharmacy, started on Losartan 12.5 on 4/22, increased to 25 mg,  increased to 50 on 4/28, increased to 75 on 5/1 with eventual plans for Entresto  Slightly labile on 5/5 15.  Hyperlipidemia.  Lipitor 16.  Prediabetes.  Hemoglobin A1c 5.9.  SSI  Metformin 500 daily started on 5/4 CBG (last 3)  Recent Labs    11/05/19 1648 11/05/19 2101 11/06/19 0624  GLUCAP 169* 189* 158*   Slightly labile on 5/5  Plan to transition to carb modified diet when advanced to regular textures 17. Acute systolic CHF   Filed Weights   11/03/19 0429 11/04/19 0400 11/05/19 0617  Weight: 68 kg 67.3 kg 67.1 kg   Stable on 5/5 18. Hypomagnesemia  Mag 1.5 on 5/5  Supplemented IV on 4/19, again on 4/23, again on 4/27, again on 5/5  Oral supplementation  initiated, increased on 4/30, increased again on 5/5 19.  Hypokalemia  Potassium 4.0 on 4/28  20. Mod/Severe Hypoalbuminemia  Supplement initiated 21.  Urinary frequency/Neurogenic bladder  PVRs ordered, only performed x1 with minimal residual  UA unremarkable, urine culture minimal growth, repeat UA relatively unremarkable   Keflex course completed on 4/29  Enablex started on 4/30, increased on 5/2  Flomax started on 5/5  Improving on 5/5  LOS: 21 days A FACE TO FACE EVALUATION WAS PERFORMED  Brittney Kennedy Brittney Kennedy 11/06/2019, 9:09 AM

## 2019-11-06 NOTE — Progress Notes (Signed)
Social Work Patient ID: Brittney Kennedy, female   DOB: 28-Aug-1950, 69 y.o.   MRN: [id number]  Sw son called to provide team conference updates. SW provided team conference updates from MD, nursing and therapy. Sw also provided discharge information about discharge planned for MAY 13TH. SW will follow up with any questions and concerns.

## 2019-11-06 NOTE — Patient Care Conference (Signed)
Inpatient RehabilitationTeam Conference and Plan of Care Update Date: 11/06/2019   Time: 11:05 AM    Patient Name: Brittney Kennedy      Medical Record Number: [id number]  Date of Birth: 08-13-1950 Sex: Female         Room/Bed: 4W12C/4W12C-01 Payor Info: Payor: MEDICARE / Plan: MEDICARE PART A AND B / Product Type: *No Product type* /    Admit Date/Time:  10/16/2019  4:10 PM  Primary Diagnosis:  Embolic infarction [facility])  Patient Active Problem List   Diagnosis Date Noted  . Neurogenic bladder   . Acute lower UTI   . Urinary frequency   . Hypoalbuminemia due to protein-calorie malnutrition ([place])   . Hypokalemia   . Hypomagnesemia   . Labile blood pressure   . Labile blood glucose   . Acute systolic congestive heart failure ([place])   . Essential hypertension   . Keratoconjunctivitis   . Right middle cerebral artery stroke ([place]) 10/16/2019  . Embolic infarction ([place]) 10/16/2019  . Prediabetes   . Benign essential HTN   . Dysphagia, post-stroke   . Duodenal ulcer   . Acute blood loss anemia 10/08/2019  . Upper GI bleed   . Status post coronary artery stent placement   . Malnutrition of moderate degree 10/02/2019  . Cerebral embolism with cerebral infarction 10/01/2019  . CAD (coronary artery disease) 09/27/2019  . Acute systolic CHF (congestive heart failure) ([place]) 09/27/2019  . NSTEMI (non-ST elevated myocardial infarction) ([place]) 09/26/2019    Expected Discharge Date: Expected Discharge Date: 11/14/19  Team Members Present: Physician leading conference: Dr. Delice Lesch Care Coodinator Present: Karene Fry, RN, MSN;Christina Sampson Goon, BSW;Deborah Tobin Chad, RN, BSN, [place] Nurse Present: Other (comment)(Tomeka Pugh, LPN) PT Present: Deniece Ree, PT OT Present: Meriel Pica, OT SLP Present: Jettie Booze, CF-SLP PPS Coordinator present : Ileana Ladd, Burna Mortimer, SLP     Current Status/Progress Goal Weekly Team Focus  Bowel/Bladder   Patient remains continent of  bladder/ bowel, occassional incontinent episodes ssecondary to urgency -UTI continue antx  remain continent  QS/PRN assess, toileting Q2-3 hrs   Swallow/Nutrition/ Hydration   Dysphagia 2, thin liquids with left head turn, Min A left head turn (has more difficulty in environments with distractions)  Min A  tolerance current diet, work towrad advancing to dys 3, carryover/independence with swallow strategies, education   ADL's   min A UB self care, Mod A LB self care, CGA  to min A sit to stands, standing balance, transfers; fatigues easily, improving LUE AROM and functional use  min A overall with bathing, LB dressing, toileting; supervision with UB dressing, grooming; min A with transfers, standing balance  ADL training, LUE NMR, balance, L side awareness, cognition, pt/family education   Mobility   Min-ModA bed mobility, generally MinA for transfers and gait up to 129ft this week with RW; progressing towards goals  S-CGA overall  all aspects of functional mobilityand gait, balance, NMR, strength, L attention, family ed   Communication   Min-Mod A intelligibility strategies, 85% intelligible convo  Min A  carryover strategies in conversation, awareness of verbal errors   Safety/Cognition/ Behavioral Observations  mod A  Min A  sustained attention, recall, problem solving, intellectual awareness, left scanning, education   Pain   no c/o  remain pain free or when pain occurs pain stays <4 with or without activity after pain medication  QS/PRN assessment   Skin   skin intact,  remain free of skin breakdown  QS/PRN  assessmenr  Rehab Goals Patient on target to meet rehab goals: Yes Rehab Goals Revised: Patient on target with current goals *See Care Plan and progress notes for long and short-term goals.     Barriers to Discharge  Current Status/Progress Possible Resolutions Date Resolved   Nursing                  PT        doing well, on track to meet LTGs           OT                   SLP                SW Lack of/limited family support Patient orginal goal to d/c home independently, son arranging for in home care provided by family. On target for discharge of 11/14/19          Discharge Planning/Teaching Needs:  Patient and son plans to discharge home with family to provide care  Will schedule family education if requested   Team Discussion: MD BP up, meds adjusted, HF team following, ABLS, labs this week, K+ low, increased oral supplement and added IV supplement, urine frequency, med started.  RN cont B/B, urgency, L shou pain/ R hip pain.  OT min a transfers, shower min a, fam ed today.  PT min A walker with mod/max cues, L neglect.  SLP attention/prob solving/STM mod/max A, D2thins, upgrade to D3 this week, speech min a cues, fam ed with DIL today.  Has a son and caregiver for support.   Revisions to Treatment Plan: SLP to upgrade diet to D3 this week.     Medical Summary Current Status: Altered mental status left hemiparesis secondary to right MCA infarction along with a small left CR and splenium infarct embolic pattern likely due to transient cardiomyopathy with low EF after cardiac catheterization for non-STEMI with stenting complicated by groin hematoma Weekly Focus/Goal: Improve mobility, cognition, BP, magnesium, urinary freq, prediabetes  Barriers to Discharge: Behavior;Medical stability   Possible Resolutions to Barriers: Therapies, optimize bowel meds, hypomagnesemia, optimize prediabetes/HTN meds   Continued Need for Acute Rehabilitation Level of Care: The patient requires daily medical management by a physician with specialized training in physical medicine and rehabilitation for the following reasons: Direction of a multidisciplinary physical rehabilitation program to maximize functional independence : Yes Medical management of patient stability for increased activity during participation in an intensive rehabilitation regime.: Yes Analysis of  laboratory values and/or radiology reports with any subsequent need for medication adjustment and/or medical intervention. : Yes   I attest that I was present, lead the team conference, and concur with the assessment and plan of the team.   Lelon Frohlich M 11/06/2019, 3:06 PM   Team conference was held via web/ teleconference due to COVID - 19

## 2019-11-06 NOTE — Progress Notes (Deleted)
Social Work Patient ID: Brittney Kennedy, female   DOB: 1951/05/23, 69 y.o.   MRN: [id number]  SW called spouse to provided team conference updates. No answer, left voicemail. SW will follow up with spouse tomorrow

## 2019-11-07 ENCOUNTER — Inpatient Hospital Stay (HOSPITAL_COMMUNITY): Payer: PRIVATE HEALTH INSURANCE | Admitting: Physical Therapy

## 2019-11-07 ENCOUNTER — Inpatient Hospital Stay (HOSPITAL_COMMUNITY): Payer: PRIVATE HEALTH INSURANCE | Admitting: Occupational Therapy

## 2019-11-07 ENCOUNTER — Inpatient Hospital Stay (HOSPITAL_COMMUNITY): Payer: PRIVATE HEALTH INSURANCE | Admitting: Speech Pathology

## 2019-11-07 ENCOUNTER — Inpatient Hospital Stay (HOSPITAL_COMMUNITY): Payer: PRIVATE HEALTH INSURANCE

## 2019-11-07 ENCOUNTER — Inpatient Hospital Stay (HOSPITAL_COMMUNITY): Payer: Medicare Other

## 2019-11-07 DIAGNOSIS — G8929 Other chronic pain: Secondary | ICD-10-CM

## 2019-11-07 DIAGNOSIS — M25512 Pain in left shoulder: Secondary | ICD-10-CM

## 2019-11-07 LAB — GLUCOSE, CAPILLARY
Glucose-Capillary: 155 mg/dL — ABNORMAL HIGH (ref 70–99)
Glucose-Capillary: 157 mg/dL — ABNORMAL HIGH (ref 70–99)
Glucose-Capillary: 125 mg/dL — ABNORMAL HIGH (ref 70–99)
Glucose-Capillary: 201 mg/dL — ABNORMAL HIGH (ref 70–99)
Glucose-Capillary: 204 mg/dL — ABNORMAL HIGH (ref 70–99)

## 2019-11-07 MED ORDER — MELATONIN 3 MG PO TABS
1.5000 mg | ORAL_TABLET | Freq: Every day | ORAL | Status: DC
  Administered 2019-11-07 – 2019-11-13 (×7): 1.5 mg via ORAL
  Filled 2019-11-07 (×7): qty 1

## 2019-11-07 MED ORDER — NON FORMULARY: 1.5000 mg | Freq: Every day | Status: DC

## 2019-11-07 NOTE — Progress Notes (Signed)
Speech Language Pathology Weekly Progress and Session Note  Patient Details  Name: Brittney Kennedy MRN: [id number] Date of Birth: [birth year]  Beginning of progress report period: October 31, 2019 End of progress report period: Nov 07, 2019  Today's Date: 11/07/2019 SLP Individual Time: 1100-1200 SLP Individual Time Calculation (min): 60 min  Short Term Goals: Week 3: SLP Short Term Goal 1 (Week 3): Pt will sustain attention to basic familiar task for ~ 5 minutes with Min A cues. SLP Short Term Goal 1 - Progress (Week 3): Progressing toward goal SLP Short Term Goal 2 (Week 3): Provided Min A cues, pt will utilize speech intelligibilty strategies to achieve 90% intelligibility at sentence level. SLP Short Term Goal 2 - Progress (Week 3): Progressing toward goal SLP Short Term Goal 3 (Week 3): Pt will demonstrate ability to problem solve in functional basic situations with Mod A mutimodal cues. SLP Short Term Goal 3 - Progress (Week 3): Met SLP Short Term Goal 4 (Week 3): Pt will recall daily and/or new information with Mod A cues for use of compensatory memory strategies. SLP Short Term Goal 4 - Progress (Week 3): Met SLP Short Term Goal 5 (Week 3): Pt will demonstrate efficient mastication and oral clearance with Min A cues during upgraded trials of Dysphaiga 3 solids prior to upgrade. SLP Short Term Goal 5 - Progress (Week 3): Met SLP Short Term Goal 6 (Week 3): Pt will demonstrate ability to use her compensatory maneuver with thin liquids (left head turn) with Min A verbal cues. SLP Short Term Goal 6 - Progress (Week 3): Progressing toward goal    New Short Term Goals: Week 4: SLP Short Term Goal 1 (Week 4): STG=LTG due to remaining LOS  Weekly Progress Updates: Pt has made slow gains due to fluctuating performance over the last reporting period. She met 3 out of 6 short term goals. Pt is currently Mod assist for basic cognitive tasks due to impairments impacting her basic problem solving,  short term memory, sustained attention, and emergent awareness. Pt also with mild oropharyngeal dysphagia and consuming upgraded dysphagia 3 texture diet with thin liquids with a left head turn as compensatory strategy. She requires fluctuating Min-Mod cues for use of head turn depending on distractibility but is using strategies for oral clearance with Supervision. Pt with mild-mod dysarthria, using strategies for intelligibility with Min-Mod A. She is ~85% intelligible at sentence and conversation levels. Pt and family education is ongoing. Pt would continue to benefit from skilled ST while inpatient in order to maximize functional independence and reduce burden of care prior to discharge. Anticipate that pt will need 24/7 supervision at discharge in addition to [place] follow up at next level of care.      Intensity: Minumum of 1-2 x/day, 30 to 90 minutes Frequency: 3 to 5 out of 7 days Duration/Length of Stay: 11/14/19 Treatment/Interventions: Functional tasks;Cognitive remediation/compensation;Patient/family education;Dysphagia/aspiration precaution training;Cueing hierarchy;Internal/external aids;Speech/Language facilitation   Daily Session  Skilled Therapeutic Interventions: Pt was seen for skilled ST targeting cognition and dysphagia. In functional conversation regarding medications (targeting mainly recall of new information), pt could only recall being on a sleep medication (also stated she was on antibiotic but this was incorrect). She required Max A verbal cues for recall of current medication functions, even when provided names. List making used as strategy to increase future recall and for use to attempt organization of a pill box in future session, given time constraints did not allow for that today. She initially required Mod  A verbal cues for redirection to tasks for first 1/4 of session, but was faded to [place] A for remainder. SLP further facilitated session with an upgraded trial of a  dysphagia 3 (mech soft) lunch tray with thin liquids. Pt's mastication was mildly prolonged with chopped pork, however functional and pt achieve complete oral clearance with Supervision A verbal cues for use of lingual sweep. She continues to require fluctuating Min-Mod A verbal cues for accurate use of her left head turn strategy with thin liquids, however no overt s/sx aspiration were observed throughout liquid or solid intake. Would recommend pt upgrade to dysphagia 3 solids, continue thin liquids with left head turn and full supervision to ensure use of strategies.  Pt left sitting in bed with alarm set and tray removed, NT aware she would like to continue drinking juice and eat pears. Continue per current plan of care.        Pain Pain Assessment Pain Scale: Faces Faces Pain Scale: Hurts a little bit Pain Type: Chronic pain Pain Location: Hip Pain Orientation: Right Pain Descriptors / Indicators: Aching Pain Frequency: Intermittent Pain Onset: On-going Pain Intervention(s): Repositioned  Therapy/Group: Individual Therapy  [place] Leas 11/07/2019, 7:20 AM

## 2019-11-07 NOTE — Progress Notes (Signed)
Occupational Therapy Session Note  Patient Details  Name: Brittney Kennedy MRN: [id number] Date of Birth: 1950-12-21  Today's Date: 11/07/2019 OT Individual Time: 0900-1000 OT Individual Time Calculation (min): 60 min    Short Term Goals: Week 3:  OT Short Term Goal 1 (Week 3): Pt will be able to complete a toilet transfer with CGA. OT Short Term Goal 2 (Week 3): Pt will complete toileting with CGA. OT Short Term Goal 3 (Week 3): Pt will bathe in shower with CGA only when standing demonstrating improved LUE motor control. OT Short Term Goal 4 (Week 3): Pt will demonstrate improved L shoulder AROM and movement to don shirt with S only.  Skilled Therapeutic Interventions/Progress Updates:  Pt received supine in bed agreeable to OT intervention although pt reports increased pain in bilateral hips and in L shoulder. RN enter during session to provide pain meds; offered increased rest breaks and repositioning as pain mgmt strategy. Pt complete bed mobility with supervision this session and reports needing to void bladder once sitting EOB. Pt declined functional mobility into bathroom d/t pain. Pt complete stand pivot transfer to w/c to L side with CGA. Pt complete transfer to 3n1 over regular toilet with CGA and MOD A for clothing mgmt and hygiene. Pt complete seated grooming tasks at sink with set- up assist needing verbal cues to incorporate LUE into ADL routine. Pt required CGA for UB dressing and MIN A for LB dressing to pull pants up to waist line on L side. Overall, pt able to sit<>stand throughout session with CGA. Pt able to complete functional mobility back to bed with RW and CGA. Pt required MOD A to return to supine d/t pain in hips this session. Pt left supine in bed with all needs within reach and bed alarm activated.  Therapy Documentation Precautions:  Precautions Precautions: Fall, Other (comment) Precaution Comments: aspiration precautions (HOB >30 degrees), significant L  inattention/neglect, signifcant L hemiparesis (UE>LE with L shoulder subluxation) Restrictions Weight Bearing Restrictions: No General:   Vital Signs:  Pain: Pain Assessment Pain Scale: 0-10 Pain Score: 7  Pain Type: Chronic pain Pain Location: Hip Pain Orientation: Right;Left Pain Descriptors / Indicators: Aching;Discomfort Pain Frequency: Intermittent Pain Onset: On-going Pain Intervention(s): Medication (See eMAR)   Therapy/Group: Individual Therapy  Angelina Pih 11/07/2019, 11:14 AM

## 2019-11-07 NOTE — Progress Notes (Deleted)
Patient more restful throughout shift, repositioned, denies discomfort pain, anticipated dialysis today made as comfortable as possible, Call bell to right side of bed,bed alarm on, staff assistance for tolectin needs, Cont. Regime

## 2019-11-07 NOTE — Progress Notes (Signed)
Occupational Therapy Session Note  Patient Details  Name: Brittney Kennedy MRN: [id number] Date of Birth: 1951-06-25  Today's Date: 11/07/2019 OT Individual Time: 1300-1345 OT Individual Time Calculation (min): 45 min    Short Term Goals: Week 1:  OT Short Term Goal 1 (Week 1): Pt will be able to use L hand as a stabilizing A with UB bathing. OT Short Term Goal 1 - Progress (Week 1): Progressing toward goal OT Short Term Goal 2 (Week 1): Pt will be able to find items on her food tray on L side with min cues. OT Short Term Goal 2 - Progress (Week 1): Met OT Short Term Goal 3 (Week 1): Pt will complete squat pivot to toilet with mod A. OT Short Term Goal 3 - Progress (Week 1): Met OT Short Term Goal 4 (Week 1): Pt will don a shirt with mod A. OT Short Term Goal 4 - Progress (Week 1): Met OT Short Term Goal 5 (Week 1): Pt will rise to stand with mod A. OT Short Term Goal 5 - Progress (Week 1): Met Week 2:  OT Short Term Goal 1 (Week 2): Pt will demonstrate improved attention and ability to follow directions to don shirt with verbal cues and set up. OT Short Term Goal 1 - Progress (Week 2): Progressing toward goal OT Short Term Goal 2 (Week 2): Pt will don pants over L foot with steadying A. OT Short Term Goal 2 - Progress (Week 2): Met OT Short Term Goal 3 (Week 2): Pt will use L hand as a stabilizing A when opening/closing containers. OT Short Term Goal 3 - Progress (Week 2): Met OT Short Term Goal 4 (Week 2): Pt will be able to lift L shoulder to 45 degrees abduction to be able to wash under arm pit easily. OT Short Term Goal 4 - Progress (Week 2): Met Week 3:  OT Short Term Goal 1 (Week 3): Pt will be able to complete a toilet transfer with CGA. OT Short Term Goal 2 (Week 3): Pt will complete toileting with CGA. OT Short Term Goal 3 (Week 3): Pt will bathe in shower with CGA only when standing demonstrating improved LUE motor control. OT Short Term Goal 4 (Week 3): Pt will demonstrate  improved L shoulder AROM and movement to don shirt with S only.  Skilled Therapeutic Interventions/Progress Updates:    Pt received in w.c washing her hands with nurse tech present helping.  Pt stated she would do therapy as long as she did not have to do leg exercises as her legs were tired and in pain from therapy yesterday.  Explained DOMS and some muscle ache the next day after therapy was common, but she stated it was very painful and she did too much.   Pt taken to gym to focus on LUE NMR with A/arom with gentle table glides on a towel to practice with the exercises she will do at home.  From seated, she worked on gentle pushing forward and back with intense c/o shoulder pain, tried circle rotations and active external rotation with much less shoulder discomfort.  To change the angle of the pressure on her shoulder, had pt stand at table to do the exercises but after 5 minutes she c/o her legs hurting (the standing did not aggravate her shoulder).    Reviewed xray report of shoulder and obtained kinesiotape to apply to her shoulder to support her [facility] joint.  Demonstrated to patient what the tape feels like and  applied a piece to her R hand so she could see how it is applied.  Explained that most people do just fine with the tape but if she should have itching or sensitivity to the tape, it needs to be removed very slowly to not pull on the skin. Pt became upset about the idea of the tape irritating her skin and stated she wanted to consult with her son.  Explained that it is not invasive and is frequently used with shoulders, especially those that need support at the [facility] joint.  Her current pain prohibits the amount of therapy she can engage in with her arm.  Pt declined, wanting to consult with her son first.    Pt worked on a visual perceptual activity of matching cards and holding the cards in her L hand. Pt did well and completed the activity without cues.  Pt taken back to her room and she wanted  to lay down.  Pt transferred to bed with min A. She rolled into R sidelying, adjusted pillows to support her L arm. Pt resting with all needs met and alarm set.   Therapy Documentation Precautions:  Precautions Precautions: Fall, Other (comment) Precaution Comments: aspiration precautions (HOB >30 degrees), significant L inattention/neglect, signifcant L hemiparesis (UE>LE with L shoulder subluxation) Restrictions Weight Bearing Restrictions: No  Pain: Pain Assessment Pain Scale: Faces Pain Score: 7  Faces Pain Scale: Hurts a little bit Pain Type: Chronic pain Pain Location: Hip Pain Orientation: Right Pain Descriptors / Indicators: Aching Pain Frequency: Intermittent Pain Onset: On-going Pain Intervention(s): Repositioned ADL: ADL Grooming: Supervision/safety Where Assessed-Grooming: Sitting at sink Upper Body Bathing: Minimal assistance Where Assessed-Upper Body Bathing: Shower Lower Body Bathing: Minimal assistance Where Assessed-Lower Body Bathing: Shower Upper Body Dressing: Minimal assistance Where Assessed-Upper Body Dressing: Wheelchair Lower Body Dressing: Moderate assistance Where Assessed-Lower Body Dressing: Wheelchair Toileting: Moderate assistance Where Assessed-Toileting: Glass blower/designer: Psychiatric nurse Method: Counselling psychologist: Grab bars, Raised toilet seat Social research officer, government: Minimal assistance Social research officer, government Method: Heritage manager: Shower seat with back, Grab bars   Therapy/Group: Individual Therapy  [place] 11/07/2019, 1:01 PM

## 2019-11-07 NOTE — Progress Notes (Signed)
Physical Therapy Weekly Progress Note  Patient Details  Name: Brittney Kennedy MRN: [id number] Date of Birth: 1950-11-19  Beginning of progress report period: October 31, 2019 End of progress report period: Nov 07, 2019  Today's Date: 11/07/2019 PT Individual Time: 1450-1530 PT Individual Time Calculation (min): 40 min   Patient has met 2 of 4 short term goals.  Performance can fluctuate based on how much pain she is experiencing, and she is not consistent enough with goals yet unmet at this point to receive credit for them- with ongoing pain control, fully expect that she will be able to meet LTGs at this point however.   Patient continues to demonstrate the following deficits muscle weakness, decreased cardiorespiratoy endurance, unbalanced muscle activation, decreased coordination and decreased motor planning, decreased visual acuity and decreased visual perceptual skills, decreased attention to left, left side neglect and decreased motor planning, decreased initiation, decreased attention, decreased awareness, decreased problem solving, decreased safety awareness and delayed processing and decreased sitting balance, decreased standing balance, decreased postural control and decreased balance strategies and therefore will continue to benefit from skilled PT intervention to increase functional independence with mobility.  Patient progressing toward long term goals..  Continue plan of care.  PT Short Term   Week 3:  PT Short Term Goal 1 (Week 3): Pt will perform supine<>sit with CGA PT Short Term Goal 1 - Progress (Week 3): Progressing toward goal PT Short Term Goal 2 (Week 3): Pt will perform sit<>stand using LRAD with CGA PT Short Term Goal 2 - Progress (Week 3): Met PT Short Term Goal 3 (Week 3): Pt will perform bed<>chair transfers using LRAD with CGA PT Short Term Goal 3 - Progress (Week 3): Progressing toward goal PT Short Term Goal 4 (Week 3): Pt will ambulate at least 16f using LRAD with  min assist of 1 PT Short Term Goal 4 - Progress (Week 3): Met Week 4:  PT Short Term Goal 1 (Week 4): STGs = LTGs due to ELOS  Skilled Therapeutic Interventions/Progress Updates:    [facility] high pain levels and perseverating on stating "I think I overdid it yesterday". RN alerted to high pain levels and addressed, however still unable to convince patient to participate in standing activities today and required Max cues/encouragement to participate in L attention based task using L UE to grasp red and yellow clothes pins and place them on smallest pin possible. Very easily distracted and not motivated this afternoon. [facility] to bed with min guard and left in bed positioned to comfort with all needs met and bed alarm active.   Therapy Documentation Precautions:  Precautions Precautions: Fall, Other (comment) Precaution Comments: aspiration precautions (HOB >30 degrees), significant L inattention/neglect, signifcant L hemiparesis (UE>LE with L shoulder subluxation) Restrictions Weight Bearing Restrictions: No General:   Vital Signs: Therapy Vitals Temp: 98.3 F (36.8 C) Temp Source: Oral Pulse Rate: 79 Resp: 18 BP: (!) 113/50 Patient Position (if appropriate): Lying Oxygen Therapy SpO2: 99 % O2 Device: Room Air Pain: Pain Assessment Pain Scale: 0-10 Pain Score: 8  Faces Pain Scale: Hurts a little bit Pain Type: Chronic pain Pain Location: Hip Pain Orientation: Right Pain Descriptors / Indicators: Aching;Discomfort Pain Frequency: Intermittent Pain Onset: On-going Patients Stated Pain Goal: 0 Pain Intervention(s): Medication (See eMAR);RN made aware Multiple Pain Sites: Yes(L shoulder, aching 8/10 pain, chronic, treated with RN aware/medication)   Therapy/Group: Individual Therapy   KAnn Lions  PT, DPT, PN1   Supplemental Physical Therapist [place]    Pager [phone] Acute Rehab  Office [phone]    11/07/2019, 3:51 PM

## 2019-11-07 NOTE — Progress Notes (Signed)
Notified by Kenney Houseman NT,patient was up in wheelchair asking to go into the BR, she assisted her she became diaphoretic episode with patient while patient was in the bathroom having a BM,she assisted patient back to bed and states she was somewhat confused.Upon assessing the patient she was alert,oriented x3, denies pain,SOB,or discomfort, she did state  that she had stained while having a BM and became lightheaded. VS and CBG obtained and patient monitor closely without acute distress.  815pm On callJacalyn Lefevre, NP) notified of above occurrence continue to monitor no new orders

## 2019-11-07 NOTE — Progress Notes (Signed)
[facility] PHYSICAL MEDICINE & REHABILITATION PROGRESS NOTE  Subjective/Complaints: Patient seen laying in bed this AM.  She states she slept fairly overnight due to frequent "beeping".  She denies shoulder pain.   ROS: Denies CP, shortness of breath, nausea, vomiting, diarrhea.  Objective: Vital Signs: Blood pressure (!) 143/66, pulse 84, temperature 98.3 F (36.8 C), resp. rate 17, height 5\' 2"  (1.575 m), weight 68.2 kg, SpO2 98 %. No results found. No results for input(s): WBC, HGB, HCT, PLT in the last 72 hours. No results for input(s): NA, K, CL, CO2, GLUCOSE, BUN, CREATININE, CALCIUM in the last 72 hours.  Physical Exam: BP (!) 143/66   Pulse 84   Temp 98.3 F (36.8 C)   Resp 17   Ht 5\' 2"  (1.575 m)   Wt 68.2 kg   SpO2 98%   BMI 27.50 kg/m   Constitutional: No distress . Vital signs reviewed. HENT: Normocephalic.  Atraumatic. Eyes: EOMI. No discharge. Cardiovascular: No JVD. Respiratory: Normal effort.  No stridor. GI: Non-distended. Skin: Warm and dry.  Intact. Psych: Normal mood.  Normal behavior. Musc: No edema in extremities.  No tenderness in extremities. Neuro: Alert Motor:  LUE: Shoulder abduction 4/5, distally 4/5, improving LLE: 4-/5 proximal to distal (?  Some pain inhibition), stable  Assessment/Plan: 1. Functional deficits secondary to bilateral embolic infarcts.  Which require 3+ hours per day of interdisciplinary therapy in a comprehensive inpatient rehab setting.  Physiatrist is providing close team supervision and 24 hour management of active medical problems listed below.  Physiatrist and rehab team continue to assess barriers to discharge/monitor patient progress toward functional and medical goals  Care Tool:  Bathing    Body parts bathed by patient: Face, Chest, Abdomen, Front perineal area, Right upper leg, Left upper leg, Right lower leg, Left lower leg, Left arm, Right arm, Buttocks   Body parts bathed by helper: Right arm,  Buttocks Body parts n/a: Right arm, Left arm   Bathing assist Assist Level: Minimal Assistance - Patient > 75%     Upper Body Dressing/Undressing Upper body dressing   What is the patient wearing?: Pull over shirt    Upper body assist Assist Level: Contact Guard/Touching assist    Lower Body Dressing/Undressing Lower body dressing      What is the patient wearing?: Pants, Incontinence brief     Lower body assist Assist for lower body dressing: Minimal Assistance - Patient > 75%     Toileting Toileting    Toileting assist Assist for toileting: Moderate Assistance - Patient 50 - 74%     Transfers Chair/bed transfer  Transfers assist     Chair/bed transfer assist level: Contact Guard/Touching assist     Locomotion Ambulation   Ambulation assist      Assist level: Contact Guard/Touching assist Assistive device: Walker-rolling Max distance: 68ft   Walk 10 feet activity   Assist     Assist level: Contact Guard/Touching assist Assistive device: Walker-rolling   Walk 50 feet activity   Assist Walk 50 feet with 2 turns activity did not occur: Safety/medical concerns  Assist level: Moderate Assistance - Patient - 50 - 74% Assistive device: Walker-rolling    Walk 150 feet activity   Assist Walk 150 feet activity did not occur: Safety/medical concerns         Walk 10 feet on uneven surface  activity   Assist Walk 10 feet on uneven surfaces activity did not occur: Safety/medical concerns  Wheelchair     Assist Will patient use wheelchair at discharge?: (TBD)             Wheelchair 50 feet with 2 turns activity    Assist            Wheelchair 150 feet activity     Assist            Medical Problem List and Plan: 1.  Altered mental status left hemiparesis secondary to right MCA infarction along with a small left CR and splenium infarct embolic pattern likely due to transient cardiomyopathy with low EF  after cardiac catheterization for non-STEMI with stenting complicated by groin hematoma  Continue CIR  Repeat CT unremarkable for changes 2.  Antithrombotics: -DVT/anticoagulation: SCDs             -antiplatelet therapy: Plavix 3. Pain Management: Tylenol as needed  Voltaren gel to bilateral knees ordered on 4/27, with improvement  Voltaren gel added to left shoulder (history of rotator cuff repain) 4. Mood: Amantadine 100 mg twice daily, DC'd secondary to hallucinations- son is requesting therapist to see pt, will ask Neuropsych follow up   Melatonin 3 mg nightly             -antipsychotic agents: agitation resolved d/c haldol 5. Neuropsych: This patient is not capable of making decisions on her own behalf. 6. Skin/Wound Care: Routine skin checks 7. Fluids/Electrolytes/Nutrition: Routine in and outs.   BMP within acceptable range on 4/28 8.  GI bleed/duodenal ulcer.  Status post EGD 10/08/2019 with epi and hemostasis clips x5.  PPI twice daily             See #9 9.  Acute blood loss anemia.    Hemoglobin 11.0 on 4/24, labs ordered for tomorrow  Continue to monitor 10.  AKI.  Resolved.  11.  Acute urinary tract infection.  Patient completed 5-day course amoxicillin 10/17/2019 12.  Post stroke dysphagia.  Dysphagia #2 thins liquids.  Follow-up speech therapy             Advance diet as tolerated  Plan to transition to carb modified diet after ability to consume regular textures 13.  Keratoconjunctivitis.  Follow-up ophthalmology services.  Completed 7-day course of erythromycin drops on 4/17. 14.  Hypertension.     Increased Coreg to 6.25 mg BID with meals  Discussed with pharmacy, started on Losartan 12.5 on 4/22, increased to 25 mg, increased to 50 on 4/28, increased to 75 on 5/1 with eventual plans for Entresto  Slightly labile on 5/6, but ?improving 15.  Hyperlipidemia.  Lipitor 16.  Prediabetes.  Hemoglobin A1c 5.9.  SSI  Metformin 500 daily started on 5/4 CBG (last 3)  Recent  Labs    11/06/19 1733 11/06/19 2102 11/07/19 0624  GLUCAP 110* 144* 155*   Slightly labile on 5/6  Plan to transition to carb modified diet when advanced to regular textures 17. Acute systolic CHF   Filed Weights   11/04/19 0400 11/05/19 0617 11/07/19 0442  Weight: 67.3 kg 67.1 kg 68.2 kg   Stable on 5/6 18. Hypomagnesemia  Mag 1.5 on 5/5, labs ordered for tomorrow  Supplemented IV on 4/19, again on 4/23, again on 4/27, again on 5/5  Oral supplementation initiated, increased on 4/30, increased again on 5/5 19.  Hypokalemia  Potassium 4.0 on 4/28  20. Mod/Severe Hypoalbuminemia  Supplement initiated 21.  Urinary frequency/Neurogenic bladder  PVRs ordered, only performed x1 with minimal residual  UA unremarkable, urine culture minimal  growth, repeat UA relatively unremarkable   Keflex course completed on 4/29  Enablex started on 4/30, increased on 5/2  Flomax started on 5/5  Improving overall on 5/5  Melatonin started on 5/3 due to psychological component  22. Left shoulder pain per therapies  See #3  History of rotator cuff repair  Xray ordered  LOS: 22 days A FACE TO FACE EVALUATION WAS PERFORMED  Brittney Kennedy Brittney Kennedy 11/07/2019, 9:42 AM

## 2019-11-08 ENCOUNTER — Inpatient Hospital Stay (HOSPITAL_COMMUNITY): Payer: PRIVATE HEALTH INSURANCE | Admitting: Occupational Therapy

## 2019-11-08 ENCOUNTER — Inpatient Hospital Stay (HOSPITAL_COMMUNITY): Payer: PRIVATE HEALTH INSURANCE | Admitting: Physical Therapy

## 2019-11-08 ENCOUNTER — Inpatient Hospital Stay (HOSPITAL_COMMUNITY): Payer: PRIVATE HEALTH INSURANCE | Admitting: Speech Pathology

## 2019-11-08 DIAGNOSIS — M19012 Primary osteoarthritis, left shoulder: Secondary | ICD-10-CM

## 2019-11-08 LAB — CBC WITH DIFFERENTIAL/PLATELET
Abs Immature Granulocytes: 0.03 10*3/uL (ref 0.00–0.07)
Basophils Absolute: 0 10*3/uL (ref 0.0–0.1)
Basophils Relative: 1 %
Eosinophils Absolute: 0.2 10*3/uL (ref 0.0–0.5)
Eosinophils Relative: 3 %
HCT: 30.5 % — ABNORMAL LOW (ref 36.0–46.0)
Hemoglobin: 9.8 g/dL — ABNORMAL LOW (ref 12.0–15.0)
Immature Granulocytes: 0 %
Lymphocytes Relative: 34 %
Lymphs Abs: 2.4 10*3/uL (ref 0.7–4.0)
MCH: 30.5 pg (ref 26.0–34.0)
MCHC: 32.1 g/dL (ref 30.0–36.0)
MCV: 95 fL (ref 80.0–100.0)
Monocytes Absolute: 0.9 10*3/uL (ref 0.1–1.0)
Monocytes Relative: 12 %
Neutro Abs: 3.6 10*3/uL (ref 1.7–7.7)
Neutrophils Relative %: 50 %
Platelets: 215 10*3/uL (ref 150–400)
RBC: 3.21 MIL/uL — ABNORMAL LOW (ref 3.87–5.11)
RDW: 14.5 % (ref 11.5–15.5)
WBC: 7.1 10*3/uL (ref 4.0–10.5)
nRBC: 0 % (ref 0.0–0.2)

## 2019-11-08 LAB — MAGNESIUM: Magnesium: 1.5 mg/dL — ABNORMAL LOW (ref 1.7–2.4)

## 2019-11-08 LAB — GLUCOSE, CAPILLARY
Glucose-Capillary: 139 mg/dL — ABNORMAL HIGH (ref 70–99)
Glucose-Capillary: 148 mg/dL — ABNORMAL HIGH (ref 70–99)
Glucose-Capillary: 121 mg/dL — ABNORMAL HIGH (ref 70–99)
Glucose-Capillary: 131 mg/dL — ABNORMAL HIGH (ref 70–99)

## 2019-11-08 NOTE — Progress Notes (Signed)
Physical Therapy Session Note  Patient Details  Name: Brittney Kennedy MRN: [id number] Date of Birth: 04-Aug-1950  Today's Date: 11/08/2019 PT Individual Time: 1303-1330 PT Individual Time Calculation (min): 27 min   Short Term Goals: Week 4:  PT Short Term Goal 1 (Week 4): STGs = LTGs due to ELOS  Skilled Therapeutic Interventions/Progress Updates:    Patient received up in [facility], extremely talkative and requiring Max cues to stay on track, session very limited due to patient being very tangential and with poor attention span. Gait trained into/out of bathroom with min guard/max cues to scan to and attend to L and focus on gait mechanics; unable to have BM and required MinA to manage clothing in bathroom. Otherwise continued practicing transfers and gait today- requires Min-Mod cues for hand placement with transfers and limited to short distances in room (approximately 31f) with RW due to hip pain. [facility]belt alarm active this afternoon.   Therapy Documentation Precautions:  Precautions Precautions: Fall, Other (comment) Precaution Comments: aspiration precautions (HOB >30 degrees), significant L inattention/neglect, signifcant L hemiparesis (UE>LE with L shoulder subluxation) Restrictions Weight Bearing Restrictions: No  Pain: Pain Assessment Pain Scale: Faces Pain Score: 0-No pain Faces Pain Scale: Hurts little more Pain Type: Chronic pain Pain Location: Other (Comment)(L shoulder, R hip) Pain Orientation: Other (Comment)(L shoulder, R hip) Pain Descriptors / Indicators: Aching;Sore;Sharp Pain Onset: On-going Patients Stated Pain Goal: 0 Pain Intervention(s): Repositioned;Ambulation/increased activity;Distraction Multiple Pain Sites: No    Therapy/Group: Individual Therapy   KWindell Norfolk DPT, PN1   Supplemental Physical Therapist CCheraw   Pager [phone] Rehab Office [phone]   11/08/2019, 3:22 PM

## 2019-11-08 NOTE — Progress Notes (Signed)
Occupational Therapy Weekly Progress Note  Patient Details  Name: Brittney Kennedy MRN: [id number] Date of Birth: 1951/06/24  Beginning of progress report period: November 01, 2019 End of progress report period: Nov 08, 2019  Today's Date: 11/08/2019 OT Individual Time: 1400-1445 OT Individual Time Calculation (min): 45 min    Patient has met 3 of 4 short term goals.  Pt has made strong progress with her mobility skills to enable her to stand, transfer, complete toileting and bathing with CGA.  She has been limited with her LUE AROM due to shoulder pain but can use her hand as an active A now.  She continues to have mod imp visual deficits with L visual scanning, fixation, spatial relations, and L visual filed attention.   Patient continues to demonstrate the following deficits: muscle weakness, decreased cardiorespiratoy endurance, abnormal tone, unbalanced muscle activation and decreased coordination, decreased visual acuity, decreased visual perceptual skills, decreased visual motor skills and hemianopsia, decreased attention to left, decreased memory and delayed processing and decreased standing balance, decreased postural control, hemiplegia and decreased balance strategies and therefore will continue to benefit from skilled OT intervention to enhance overall performance with BADL.  Patient progressing toward long term goals..  Continue plan of care.  LTGs min A overall.    OT Short Term Goals Week 1:  OT Short Term Goal 1 (Week 1): Pt will be able to use L hand as a stabilizing A with UB bathing. OT Short Term Goal 1 - Progress (Week 1): Progressing toward goal OT Short Term Goal 2 (Week 1): Pt will be able to find items on her food tray on L side with min cues. OT Short Term Goal 2 - Progress (Week 1): Met OT Short Term Goal 3 (Week 1): Pt will complete squat pivot to toilet with mod A. OT Short Term Goal 3 - Progress (Week 1): Met OT Short Term Goal 4 (Week 1): Pt will don a shirt with mod  A. OT Short Term Goal 4 - Progress (Week 1): Met OT Short Term Goal 5 (Week 1): Pt will rise to stand with mod A. OT Short Term Goal 5 - Progress (Week 1): Met Week 2:  OT Short Term Goal 1 (Week 2): Pt will demonstrate improved attention and ability to follow directions to don shirt with verbal cues and set up. OT Short Term Goal 1 - Progress (Week 2): Progressing toward goal OT Short Term Goal 2 (Week 2): Pt will don pants over L foot with steadying A. OT Short Term Goal 2 - Progress (Week 2): Met OT Short Term Goal 3 (Week 2): Pt will use L hand as a stabilizing A when opening/closing containers. OT Short Term Goal 3 - Progress (Week 2): Met OT Short Term Goal 4 (Week 2): Pt will be able to lift L shoulder to 45 degrees abduction to be able to wash under arm pit easily. OT Short Term Goal 4 - Progress (Week 2): Met Week 3:  OT Short Term Goal 1 (Week 3): Pt will be able to complete a toilet transfer with CGA. OT Short Term Goal 1 - Progress (Week 3): Met OT Short Term Goal 2 (Week 3): Pt will complete toileting with CGA. OT Short Term Goal 2 - Progress (Week 3): Met OT Short Term Goal 3 (Week 3): Pt will bathe in shower with CGA only when standing demonstrating improved LUE motor control. OT Short Term Goal 3 - Progress (Week 3): Met OT Short Term Goal 4 (Week  3): Pt will demonstrate improved L shoulder AROM and movement to don shirt with S only. OT Short Term Goal 4 - Progress (Week 3): Progressing toward goal Week 4:  OT Short Term Goal 1 (Week 4): STGs = LTGs  Skilled Therapeutic Interventions/Progress Updates:    Pt seen this session to focus on visual perceptual skills. Pt received in bed agreeable to therapy. Sat to EOB, stood to RW and transferred to wc with CGA. Pt taken to gym to work on 2 visual scanning sheets with letter and word cancellation. She needed max A and visual modifications (using another sheet of paper to block lines) to scan to left and keep track of each line. Pt  had great difficulty with these activities.  She would skip around and find items in R visual field well, but needed max A to find the words in L visual field. Discussed these deficits with her and how this should be something to continue working on at home. Will provide some home exercise ideas to pt and family prior to discharge.   Pt returned to room and transferred back to bed to rest.  She continues to want to wait to speak to her son about the kinesiotape for her L shoulder.    Pt resting in bed with all needs met.  Therapy Documentation Precautions:  Precautions Precautions: Fall, Other (comment) Precaution Comments: aspiration precautions (HOB >30 degrees), significant L inattention/neglect, signifcant L hemiparesis (UE>LE with L shoulder subluxation) Restrictions Weight Bearing Restrictions: No  Therapy Vitals Temp: 97.9 F (36.6 C) Pulse Rate: 76 Resp: 18 BP: 115/77 Patient Position (if appropriate): Lying Oxygen Therapy SpO2: 100 % O2 Device: Room Air Pain: Pain Assessment Pain Scale: 0-10 Pain Score: 0-No pain Faces Pain Scale: Hurts little more Pain Type: Chronic pain Pain Location: Other (Comment)(R hip/L shoulder) Pain Orientation: (R hip/L shoulder) Pain Descriptors / Indicators: Aching;Sore;Sharp Pain Onset: On-going Patients Stated Pain Goal: 0 Pain Intervention(s): Ambulation/increased activity;Distraction;Repositioned Multiple Pain Sites: No ADL: ADL Grooming: Supervision/safety Where Assessed-Grooming: Sitting at sink Upper Body Bathing: Minimal assistance Where Assessed-Upper Body Bathing: Shower Lower Body Bathing: Minimal assistance Where Assessed-Lower Body Bathing: Shower Upper Body Dressing: Minimal assistance Where Assessed-Upper Body Dressing: Wheelchair Lower Body Dressing: Moderate assistance Where Assessed-Lower Body Dressing: Wheelchair Toileting: Moderate assistance Where Assessed-Toileting: Glass blower/designer: Microbiologist Method: Counselling psychologist: Grab bars, Raised toilet seat Social research officer, government: Minimal assistance Social research officer, government Method: Heritage manager: Shower seat with back, Grab bars   Therapy/Group: Individual Therapy  [place] 11/08/2019, 3:06 PM

## 2019-11-08 NOTE — Progress Notes (Signed)
Physical Therapy Session Note  Patient Details  Name: Brittney Kennedy MRN: [id number] Date of Birth: 14-Nov-1950  Today's Date: 11/08/2019 PT Individual Time: 0900-1013 PT Individual Time Calculation (min): 73 min   Short Term Goals: Week 4:  PT Short Term Goal 1 (Week 4): STGs = LTGs due to ELOS  Skilled Therapeutic Interventions/Progress Updates:    Patient received in bed, perseverating on how honey spilled all over her table and sheets throughout session and requiring Mod-max redirection for focus on task at hand due to poor attention span and tendency to perseverate. Needed ModA for supine to sit getting off L hand side of bed, however then able to perform functional transfers and gait approximately 64f x2 with min guard and RW. Needed extended time and max encouragement/max redirection to task at hand for dressing  And brushing teeth today, also max encouragement to participate in standing/gait based activities. Finally able to convince her to participate in cornhole with reaches to the L side and cues for L attention during this activity, MinA to maintain balance without BUE support while playing cornhole. Overall needed Max cues for L attention and general attention to task this morning. [facility], all needs otherwise met this morning.   Therapy Documentation Precautions:  Precautions Precautions: Fall, Other (comment) Precaution Comments: aspiration precautions (HOB >30 degrees), significant L inattention/neglect, signifcant L hemiparesis (UE>LE with L shoulder subluxation) Restrictions Weight Bearing Restrictions: No   Pain: Pain Assessment Pain Scale: Faces Faces Pain Scale: Hurts little more Pain Type: Chronic pain Pain Location: Other (Comment)(R hip/L shoulder) Pain Orientation: (R hip/L shoulder) Pain Descriptors / Indicators: Aching;Sore;Sharp Pain Onset: On-going Patients Stated Pain Goal: 0 Pain Intervention(s): Ambulation/increased  activity;Distraction;Repositioned Multiple Pain Sites: No    Therapy/Group: Individual Therapy   KWindell Norfolk DPT, PN1   Supplemental Physical Therapist CBedford   Pager [phone] Rehab Office [phone]   11/08/2019, 12:47 PM

## 2019-11-08 NOTE — Progress Notes (Signed)
Recreational Therapy Session Note  Patient Details  Name: Brittney Kennedy MRN: [id number] Date of Birth: 03-Jul-1951 Today's Date: 11/08/2019 Time:  [phone] Pain: c/o of intermittent pain with mobility, relieved with repositioning. Skilled Therapeutic Interventions/Progress Updates: session focused on activity tolerance, ambulation, dynamic standing balance and visual scanning during co-treat with PT.  Pt ambulated with RW ~150' with min assist, verbal cues for clearing L foot.  Once in the dayroom, pt stood to play corn hole game scanning to the left to locate bean bags with min cues for scanning and contact guard-min assist for balance.  Pt sharing stories about playing games and planning games for family reunions.  Therapy/Group: Co-Treatment  Dierdra Salameh 11/08/2019, 10:27 AM

## 2019-11-08 NOTE — Progress Notes (Signed)
Speech Language Pathology Daily Session Note  Patient Details  Name: Brittney Kennedy MRN: [id number] Date of Birth: 1950/12/17  Today's Date: 11/08/2019 SLP Individual Time: 1104-1200 SLP Individual Time Calculation (min): 56 min  Short Term Goals: Week 4: SLP Short Term Goal 1 (Week 4): STG=LTG due to remaining LOS  Skilled Therapeutic Interventions:  Pt was seen for skilled ST targeting cognitive goals.  SLP facilitated the session with a medication management task to address goals for problem solving and attention.  Pt initially was very distractible when trying to complete task and she even appeared anxious when the task was presented in a way that was different than the way she was accustomed to organizing her medications at home.  SLP provided encouragement and reassurance and explained that rationale for completing task as it pertains to improving her cognitive functioning to maximize independence.  Pt became much calmer and more focused on task after more thorough explanation of task procedures and encouragement but she still needed max assist to organize pills into a 4x daily pill box due to visual scanning deficits, decreased task organization, and decreased awareness of errors.  Pt was able to sustain her attention to task for 3-5 minute intervals before requiring min assist verbal cues for redirection to task.  Pt was left in wheelchair with chair alarm set and call bell within reach.  Continue per current plan of care.    Pain Pain Assessment Pain Scale: 0-10 Pain Score: 0-No pain   Therapy/Group: Individual Therapy  Dayyan Krist, Melanee Spry 11/08/2019, 12:48 PM

## 2019-11-08 NOTE — Progress Notes (Signed)
[place] PHYSICAL MEDICINE & REHABILITATION PROGRESS NOTE  Subjective/Complaints: Patient seen sitting up in bed this morning, eating breakfast.  Nurse tech at bedside.  Reviewed, results with patient.  She states she notes improvement in left shoulder pain with Voltaren gel.  She is moving extremity freely without pain.  ROS: Denies shoulder pain, CP, shortness of breath, nausea, vomiting, diarrhea.  Objective: Vital Signs: Blood pressure (!) 143/69, pulse 82, temperature 98 F (36.7 C), resp. rate 18, height 5\' 2"  (1.575 m), weight 68 kg, SpO2 96 %. DG Shoulder Left  Result Date: 11/07/2019 CLINICAL DATA:  Pain and weakness EXAM: LEFT SHOULDER - 2+ VIEW COMPARISON:  None. FINDINGS: Frontal, oblique, and Y scapular images were obtained. No acute fracture or dislocation. There is moderate narrowing of the acromioclavicular joint. There is widening of the acromioclavicular joint suggesting acromioclavicular separation. There is no overt coracoclavicular separation. Small foci of calcification are noted in the lateral aspect of the joint. Visualized left lung clear. IMPRESSION: Osteoarthritic change, primarily in the glenohumeral joint. Suspect a degree of acromioclavicular separation on the left. No fracture or frank dislocation. Calcification in the lateral aspect of the shoulder joint is likely due to calcific tendinosis. Electronically Signed   By: 01/07/2020 III M.D.   On: 11/07/2019 10:40   Recent Labs    11/08/19 0647  WBC 7.1  HGB 9.8*  HCT 30.5*  PLT 215   No results for input(s): NA, K, CL, CO2, GLUCOSE, BUN, CREATININE, CALCIUM in the last 72 hours.  Physical Exam: BP (!) 143/69   Pulse 82   Temp 98 F (36.7 C)   Resp 18   Ht 5\' 2"  (1.575 m)   Wt 68 kg   SpO2 96%   BMI 27.42 kg/m   Constitutional: No distress . Vital signs reviewed. HENT: Normocephalic.  Atraumatic. Eyes: EOMI. No discharge. Cardiovascular: No JVD. Respiratory: Normal effort.  No  stridor. GI: Non-distended. Skin: Warm and dry.  Intact. Psych: Normal mood.  Normal behavior. Musc: No edema in extremities.  No tenderness in extremities. Neuro: Alert Motor:  LUE: Shoulder abduction 3/5, distally 4/5, improving LLE: 4/5 proximal to distal (?  Some pain inhibition), improving  Assessment/Plan: 1. Functional deficits secondary to bilateral embolic infarcts.  Which require 3+ hours per day of interdisciplinary therapy in a comprehensive inpatient rehab setting.  Physiatrist is providing close team supervision and 24 hour management of active medical problems listed below.  Physiatrist and rehab team continue to assess barriers to discharge/monitor patient progress toward functional and medical goals  Care Tool:  Bathing    Body parts bathed by patient: Face   Body parts bathed by helper: Right arm, Buttocks Body parts n/a: Right arm, Left arm   Bathing assist Assist Level: Set up assist     Upper Body Dressing/Undressing Upper body dressing   What is the patient wearing?: Pull over shirt    Upper body assist Assist Level: Contact Guard/Touching assist    Lower Body Dressing/Undressing Lower body dressing      What is the patient wearing?: Pants, Incontinence brief     Lower body assist Assist for lower body dressing: Minimal Assistance - Patient > 75%     Toileting Toileting    Toileting assist Assist for toileting: Moderate Assistance - Patient 50 - 74%     Transfers Chair/bed transfer  Transfers assist     Chair/bed transfer assist level: Contact Guard/Touching assist     Locomotion Ambulation   Ambulation assist  Assist level: Contact Guard/Touching assist Assistive device: Walker-rolling Max distance: 75ft   Walk 10 feet activity   Assist     Assist level: Contact Guard/Touching assist Assistive device: Walker-rolling   Walk 50 feet activity   Assist Walk 50 feet with 2 turns activity did not occur:  Safety/medical concerns  Assist level: Moderate Assistance - Patient - 50 - 74% Assistive device: Walker-rolling    Walk 150 feet activity   Assist Walk 150 feet activity did not occur: Safety/medical concerns         Walk 10 feet on uneven surface  activity   Assist Walk 10 feet on uneven surfaces activity did not occur: Safety/medical concerns         Wheelchair     Assist Will patient use wheelchair at discharge?: (TBD)             Wheelchair 50 feet with 2 turns activity    Assist            Wheelchair 150 feet activity     Assist            Medical Problem List and Plan: 1.  Altered mental status left hemiparesis secondary to right MCA infarction along with a small left CR and splenium infarct embolic pattern likely due to transient cardiomyopathy with low EF after cardiac catheterization for non-STEMI with stenting complicated by groin hematoma  Continue CIR  Repeat CT unremarkable for changes 2.  Antithrombotics: -DVT/anticoagulation: SCDs             -antiplatelet therapy: Plavix 3. Pain Management: Tylenol as needed  Voltaren gel to bilateral knees ordered on 4/27, with improvement  Voltaren gel added to left shoulder (history of rotator cuff repain), with improvement 4. Mood: Amantadine 100 mg twice daily, DC'd secondary to hallucinations- resolved  Melatonin 3 mg nightly             -antipsychotic agents: agitation resolved d/c haldol 5. Neuropsych: This patient is not capable of making decisions on her own behalf. 6. Skin/Wound Care: Routine skin checks 7. Fluids/Electrolytes/Nutrition: Routine in and outs.   BMP within acceptable range on 4/28 8.  GI bleed/duodenal ulcer.  Status post EGD 10/08/2019 with epi and hemostasis clips x5.  PPI twice daily             See #9  Will discuss with GI, consider decreasing Protonix 9.  Acute blood loss anemia.    Hemoglobin 9.8 on 5/7  Continue to monitor 10.  AKI.  Resolved.  11.   Acute urinary tract infection.  Patient completed 5-day course amoxicillin 10/17/2019 12.  Post stroke dysphagia.  Dysphagia #2 thins liquids.  Follow-up speech therapy             Advance diet as tolerated  Plan to transition to carb modified diet after ability to consume regular textures 13.  Keratoconjunctivitis.  Follow-up ophthalmology services.  Completed 7-day course of erythromycin drops on 4/17. 14.  Hypertension.     Increased Coreg to 6.25 mg BID with meals  Discussed with pharmacy, started on Losartan 12.5 on 4/22, increased to 25 mg, increased to 50 on 4/28, increased to 75 on 5/1 with eventual plans for Entresto  Relatively controlled on 5/7 15.  Hyperlipidemia.  Lipitor 16.  Prediabetes.  Hemoglobin A1c 5.9.  SSI  Metformin 500 daily started on 5/4 CBG (last 3)  Recent Labs    11/07/19 1935 11/07/19 2058 11/08/19 0639  GLUCAP 201* 204* 139*  Slightly labile on 5/7  Plan to transition to carb modified diet when advanced to regular textures 17. Acute systolic CHF   Filed Weights   11/05/19 0617 11/07/19 0442 11/08/19 0358  Weight: 67.1 kg 68.2 kg 68 kg   Stable on 5/7 18. Hypomagnesemia  Mag 1.5 on 5/7  Supplemented IV on 4/19, again on 4/23, again on 4/27, again on 5/5  Oral supplementation initiated, increased on 4/30, increased again on 5/5  Discussed with pharmacy potential offending agents 19.  Hypokalemia  Potassium 4.0 on 4/28  20. Mod/Severe Hypoalbuminemia  Supplement initiated 21.  Urinary frequency/Neurogenic bladder  PVRs ordered, only performed x1 with minimal residual  UA unremarkable, urine culture minimal growth, repeat UA relatively unremarkable   Keflex course completed on 4/29  Enablex started on 4/30, increased on 5/2  Flomax started on 5/5  Improving overall on 5/7  Melatonin started on 5/3 due to psychological component  22. Left shoulder OA  See #3  History of rotator cuff repair  Xray personally reviewed and with patient, OA.   Calcific tendinosis.   Will consider steroid injection if pain recurs  LOS: 23 days A FACE TO FACE EVALUATION WAS PERFORMED  Elion Hocker Lorie Phenix 11/08/2019, 8:36 AM

## 2019-11-09 ENCOUNTER — Inpatient Hospital Stay (HOSPITAL_COMMUNITY): Payer: PRIVATE HEALTH INSURANCE

## 2019-11-09 LAB — GLUCOSE, CAPILLARY
Glucose-Capillary: 124 mg/dL — ABNORMAL HIGH (ref 70–99)
Glucose-Capillary: 179 mg/dL — ABNORMAL HIGH (ref 70–99)
Glucose-Capillary: 109 mg/dL — ABNORMAL HIGH (ref 70–99)
Glucose-Capillary: 151 mg/dL — ABNORMAL HIGH (ref 70–99)

## 2019-11-09 NOTE — Progress Notes (Signed)
Physical Therapy Session Note  Patient Details  Name: Brittney Kennedy MRN: [id number] Date of Birth: [birth year]  Today's Date: 11/09/2019 PT Individual Time: 2336-1224 PT Individual Time Calculation (min): 71 min    Short Term Goals: Week 1:  PT Short Term Goal 1 (Week 1): Pt will perform supine<>sit with max assist of 1 PT Short Term Goal 2 (Week 1): Pt will perform sit<>stands with max A of 1 PT Short Term Goal 3 (Week 1): Pt will perform bed<>chair transfers with max assist of 1  Skilled Therapeutic Interventions/Progress Updates:    Pt supine in bed upon PT arrival, pt upset that she called for RN/NT who came but did not help her get changed. Pt noted to be incontinent of bladder with soiled brief. Pt transferred to sitting with supervision, cues for attention to L UE/LE. Pt ambulated from bed>bathroom with RW and min assist, cues for L attention. Therapist assisted pt to doff soiled brief, assisted pt to perform lower body bathing/pericare, while in bathroom pt continent of small BM, assisted to don clean brief and shorts. Pt ambulated to w/c x10 ft with RW and min assist, assisted pt to don shoes. Pt transported to the gym. Pt ambulated x 120 ft this session with RW and min assist, cues for attention to L and increased cues for L foot clearance with fatigue. Pt used dynavision this session while working on standing balance and L attention, pt with decreased overall reaction time, performed x 2 trials, min cues, lights in upper L quadrant. Pt transported back to the gym and transferred to standing min assist>tall kneeling on mat with min assist and UE support on chair. In tall kneeling working on postural control and balance to perform reaching task x 2 trials, cues for increased hip extension. Pt transferred to quadruped this session with min assist, therapist providing support/stabilization at L elbow for extension while performing R UE reaching task in this position x 2 trials working on L sideed  weightbearing. Quadruped > sitting with min assist. Pt requesting ice chips, therapist providing supervision during this. Pt ambulated another x 120 ft this session working on turns toward the L and L attention, min assist. From the mat pt performed x 7 sit<>stands without UE support and with R LE more extended to facilitate increased L LE weightbearing and strengthening, CGA-min assist. Pt transported back to room and left in w/c with needs in reach and chair alarm set.   Therapy Documentation Precautions:  Precautions Precautions: Fall, Other (comment) Precaution Comments: aspiration precautions (HOB >30 degrees), significant L inattention/neglect, signifcant L hemiparesis (UE>LE with L shoulder subluxation) Restrictions Weight Bearing Restrictions: No    Therapy/Group: Individual Therapy  Cresenciano Genre , PT, DPT, CSRS 11/09/2019, 9:30 AM

## 2019-11-09 NOTE — Progress Notes (Signed)
[place] PHYSICAL MEDICINE & REHABILITATION PROGRESS NOTE  Subjective/Complaints: No new pain. Slept well  ROS: Patient denies fever, rash, sore throat, blurred vision, nausea, vomiting, diarrhea, cough, shortness of breath or chest pain,  back pain, headache, or mood change.     Objective: Vital Signs: Blood pressure (!) 145/70, pulse 79, temperature 98 F (36.7 C), resp. rate 18, height 5\' 2"  (1.575 m), weight 68 kg, SpO2 96 %. DG Shoulder Left  Result Date: 11/07/2019 CLINICAL DATA:  Pain and weakness EXAM: LEFT SHOULDER - 2+ VIEW COMPARISON:  None. FINDINGS: Frontal, oblique, and Y scapular images were obtained. No acute fracture or dislocation. There is moderate narrowing of the acromioclavicular joint. There is widening of the acromioclavicular joint suggesting acromioclavicular separation. There is no overt coracoclavicular separation. Small foci of calcification are noted in the lateral aspect of the joint. Visualized left lung clear. IMPRESSION: Osteoarthritic change, primarily in the glenohumeral joint. Suspect a degree of acromioclavicular separation on the left. No fracture or frank dislocation. Calcification in the lateral aspect of the shoulder joint is likely due to calcific tendinosis. Electronically Signed   By: 01/07/2020 III M.D.   On: 11/07/2019 10:40   Recent Labs    11/08/19 0647  WBC 7.1  HGB 9.8*  HCT 30.5*  PLT 215   No results for input(s): NA, K, CL, CO2, GLUCOSE, BUN, CREATININE, CALCIUM in the last 72 hours.  Physical Exam: BP (!) 145/70 (BP Location: Right Arm)   Pulse 79   Temp 98 F (36.7 C)   Resp 18   Ht 5\' 2"  (1.575 m)   Wt 68 kg   SpO2 96%   BMI 27.42 kg/m   Constitutional: No distress . Vital signs reviewed. HEENT: EOMI, oral membranes moist Neck: supple Cardiovascular: RRR without murmur. No JVD    Respiratory/Chest: CTA Bilaterally without wheezes or rales. Normal effort    GI/Abdomen: BS +, non-tender, non-distended Ext: no  clubbing, cyanosis, or edema Psych: pleasant and cooperative Musc: No edema in extremities.  No tenderness in extremities. Neuro: Alert Motor:  LUE: Shoulder abduction 3/5, distally 4/5, improving LLE: 4/5 proximal to distal (?  Some pain inhibition), improving  Assessment/Plan: 1. Functional deficits secondary to bilateral embolic infarcts.  Which require 3+ hours per day of interdisciplinary therapy in a comprehensive inpatient rehab setting.  Physiatrist is providing close team supervision and 24 hour management of active medical problems listed below.  Physiatrist and rehab team continue to assess barriers to discharge/monitor patient progress toward functional and medical goals  Care Tool:  Bathing    Body parts bathed by patient: Face   Body parts bathed by helper: Right arm, Buttocks Body parts n/a: Right arm, Left arm   Bathing assist Assist Level: Set up assist     Upper Body Dressing/Undressing Upper body dressing   What is the patient wearing?: Pull over shirt    Upper body assist Assist Level: Contact Guard/Touching assist    Lower Body Dressing/Undressing Lower body dressing      What is the patient wearing?: Pants, Incontinence brief     Lower body assist Assist for lower body dressing: Minimal Assistance - Patient > 75%     Toileting Toileting    Toileting assist Assist for toileting: Moderate Assistance - Patient 50 - 74%     Transfers Chair/bed transfer  Transfers assist     Chair/bed transfer assist level: Contact Guard/Touching assist     Locomotion Ambulation   Ambulation assist  Assist level: Contact Guard/Touching assist Assistive device: Walker-rolling Max distance: 53ft   Walk 10 feet activity   Assist     Assist level: Contact Guard/Touching assist Assistive device: Walker-rolling   Walk 50 feet activity   Assist Walk 50 feet with 2 turns activity did not occur: Safety/medical concerns  Assist level:  Contact Guard/Touching assist Assistive device: Walker-rolling    Walk 150 feet activity   Assist Walk 150 feet activity did not occur: Safety/medical concerns         Walk 10 feet on uneven surface  activity   Assist Walk 10 feet on uneven surfaces activity did not occur: Safety/medical concerns         Wheelchair     Assist Will patient use wheelchair at discharge?: (TBD)             Wheelchair 50 feet with 2 turns activity    Assist            Wheelchair 150 feet activity     Assist            Medical Problem List and Plan: 1.  Altered mental status left hemiparesis secondary to right MCA infarction along with a small left CR and splenium infarct embolic pattern likely due to transient cardiomyopathy with low EF after cardiac catheterization for non-STEMI with stenting complicated by groin hematoma  Continue CIR  Repeat CT unremarkable for changes 2.  Antithrombotics: -DVT/anticoagulation: SCDs             -antiplatelet therapy: Plavix 3. Pain Management: Tylenol as needed  Voltaren gel to bilateral knees ordered on 4/27, with improvement  Voltaren gel added to left shoulder (history of rotator cuff repain), with improvement 4. Mood: Amantadine 100 mg twice daily, DC'd secondary to hallucinations- resolved  Melatonin 3 mg nightly             -antipsychotic agents: agitation resolved d/c haldol 5. Neuropsych: This patient is not capable of making decisions on her own behalf. 6. Skin/Wound Care: Routine skin checks 7. Fluids/Electrolytes/Nutrition: Routine in and outs.   BMP within acceptable range on 4/28 8.  GI bleed/duodenal ulcer.  Status post EGD 10/08/2019 with epi and hemostasis clips x5.  PPI twice daily             See #9  Will discuss with GI, consider decreasing Protonix 9.  Acute blood loss anemia.    Hemoglobin 9.8 on 5/7  Continue to monitor 10.  AKI.  Resolved.  11.  Acute urinary tract infection.  Patient completed  5-day course amoxicillin 10/17/2019 12.  Post stroke dysphagia.  Dysphagia #3 thins liquids.  Follow-up speech therapy             Advance diet as tolerated  Plan to transition to carb modified diet after ability to consume regular textures 13.  Keratoconjunctivitis.  Follow-up ophthalmology services.  Completed 7-day course of erythromycin drops on 4/17. 14.  Hypertension.     Increased Coreg to 6.25 mg BID with meals  Discussed with pharmacy, started on Losartan 12.5 on 4/22, increased to 25 mg, increased to 50 on 4/28, increased to 75 on 5/1 with eventual plans for Entresto  Relatively controlled on 5/7-8 15.  Hyperlipidemia.  Lipitor 16.  Prediabetes.  Hemoglobin A1c 5.9.  SSI  Metformin 500 daily started on 5/4 CBG (last 3)  Recent Labs    11/08/19 1653 11/08/19 2054 11/09/19 0605  GLUCAP 121* 131* 124*   Reasonable control on 5/8  Plan to transition to carb modified diet when advanced to regular textures 17. Acute systolic CHF   Filed Weights   11/05/19 0617 11/07/19 0442 11/08/19 0358  Weight: 67.1 kg 68.2 kg 68 kg   Stable on 5/7 18. Hypomagnesemia  Mag 1.5 on 5/7  Supplemented IV on 4/19, again on 4/23, again on 4/27, again on 5/5  Oral supplementation initiated, increased on 4/30, increased again on 5/5  Discussed with pharmacy potential offending agents 19.  Hypokalemia  Potassium 4.0 on 4/28  20. Mod/Severe Hypoalbuminemia  Supplement initiated 21.  Urinary frequency/Neurogenic bladder  PVRs ordered, only performed x1 with minimal residual  UA unremarkable, urine culture minimal growth, repeat UA relatively unremarkable   Keflex course completed on 4/29  Enablex started on 4/30, increased on 5/2  Flomax started on 5/5  Improving overall on 5/7  Melatonin started on 5/3 due to psychological component  22. Left shoulder OA  See #3, seems improved  History of rotator cuff repair  Xray personally reviewed and with patient, OA.  Calcific tendinosis.   Will  consider steroid injection if pain recurs  LOS: 24 days A FACE TO FACE EVALUATION WAS PERFORMED  Ranelle Oyster 11/09/2019, 10:29 AM

## 2019-11-10 LAB — GLUCOSE, CAPILLARY
Glucose-Capillary: 134 mg/dL — ABNORMAL HIGH (ref 70–99)
Glucose-Capillary: 147 mg/dL — ABNORMAL HIGH (ref 70–99)
Glucose-Capillary: 152 mg/dL — ABNORMAL HIGH (ref 70–99)
Glucose-Capillary: 164 mg/dL — ABNORMAL HIGH (ref 70–99)

## 2019-11-11 ENCOUNTER — Inpatient Hospital Stay (HOSPITAL_COMMUNITY): Payer: PRIVATE HEALTH INSURANCE | Admitting: Occupational Therapy

## 2019-11-11 ENCOUNTER — Inpatient Hospital Stay (HOSPITAL_COMMUNITY): Payer: PRIVATE HEALTH INSURANCE | Admitting: Physical Therapy

## 2019-11-11 ENCOUNTER — Inpatient Hospital Stay (HOSPITAL_COMMUNITY): Payer: PRIVATE HEALTH INSURANCE | Admitting: Speech Pathology

## 2019-11-11 ENCOUNTER — Inpatient Hospital Stay (HOSPITAL_COMMUNITY): Payer: PRIVATE HEALTH INSURANCE

## 2019-11-11 LAB — GLUCOSE, CAPILLARY
Glucose-Capillary: 156 mg/dL — ABNORMAL HIGH (ref 70–99)
Glucose-Capillary: 128 mg/dL — ABNORMAL HIGH (ref 70–99)
Glucose-Capillary: 140 mg/dL — ABNORMAL HIGH (ref 70–99)

## 2019-11-11 NOTE — Plan of Care (Signed)
  Problem: RH Problem Solving Goal: LTG Patient will demonstrate problem solving for (SLP) Description: LTG:  Patient will demonstrate problem solving for basic/complex daily situations with cues  (SLP) Flowsheets (Taken 11/11/2019 0716) LTG Patient will demonstrate problem solving for: Moderate Assistance - Patient 50 - 74% Note: Downgraded due to slower than anticipated progress   Problem: RH Memory Goal: LTG Patient will use memory compensatory aids to (SLP) Description: LTG:  Patient will use memory compensatory aids to recall biographical/new, daily complex information with cues (SLP) Flowsheets (Taken 11/11/2019 0716) LTG: Patient will use memory compensatory aids to (SLP): Moderate Assistance - Patient 50 - 74% Note: Downgraded due to slower than anticipated progress   Problem: RH Awareness Goal: LTG: Patient will demonstrate awareness during functional activites type of (SLP) Description: LTG: Patient will demonstrate awareness during functional activites type of (SLP) Flowsheets (Taken 11/11/2019 0716) LTG: Patient will demonstrate awareness during cognitive/linguistic activities with assistance of (SLP): Moderate Assistance - Patient 50 - 74% Note: Downgraded due to slower than anticipated progress   Downgraded 11/11/19 due to slower than anticipated progress related to cognition

## 2019-11-11 NOTE — Progress Notes (Signed)
Restless throughout shift states she just cant sleep, relaxation methods encouraged, Incontinent at times, Monitor and assisted, Continue regime

## 2019-11-11 NOTE — Progress Notes (Signed)
[facility] PHYSICAL MEDICINE & REHABILITATION PROGRESS NOTE  Subjective/Complaints: Patient seen laying in bed this morning.  She states she slept well overnight.  Overnight, reported to be restless.  ROS: Denies CP, SOB, N/V/D  Objective: Vital Signs: Blood pressure (!) 151/58, pulse 83, temperature 98.2 F (36.8 C), temperature source Oral, resp. rate 16, height 5\' 2"  (1.575 m), weight 67.6 kg, SpO2 95 %. No results found. No results for input(s): WBC, HGB, HCT, PLT in the last 72 hours. No results for input(s): NA, K, CL, CO2, GLUCOSE, BUN, CREATININE, CALCIUM in the last 72 hours.  Physical Exam: BP (!) 151/58 (BP Location: Right Arm)   Pulse 83   Temp 98.2 F (36.8 C) (Oral)   Resp 16   Ht 5\' 2"  (1.575 m)   Wt 67.6 kg   SpO2 95%   BMI 27.26 kg/m   Constitutional: No distress . Vital signs reviewed. HENT: Normocephalic.  Atraumatic. Eyes: EOMI. No discharge. Cardiovascular: No JVD. Respiratory: Normal effort.  No stridor. GI: Non-distended. Skin: Warm and dry.  Intact. Psych: Normal mood.  Normal behavior. Musc: No edema in extremities.  No tenderness in extremities. Neuro: Alert Motor:  LUE: Shoulder abduction 3/5, distally 4/5, improving LLE: 4/5 proximal to distal (?  Some pain inhibition), stable  Assessment/Plan: 1. Functional deficits secondary to bilateral embolic infarcts.  Which require 3+ hours per day of interdisciplinary therapy in a comprehensive inpatient rehab setting.  Physiatrist is providing close team supervision and 24 hour management of active medical problems listed below.  Physiatrist and rehab team continue to assess barriers to discharge/monitor patient progress toward functional and medical goals  Care Tool:  Bathing    Body parts bathed by patient: Face   Body parts bathed by helper: Right arm, Buttocks Body parts n/a: Right arm, Left arm   Bathing assist Assist Level: Set up assist     Upper Body Dressing/Undressing Upper  body dressing   What is the patient wearing?: Pull over shirt    Upper body assist Assist Level: Contact Guard/Touching assist    Lower Body Dressing/Undressing Lower body dressing      What is the patient wearing?: Pants, Incontinence brief     Lower body assist Assist for lower body dressing: Minimal Assistance - Patient > 75%     Toileting Toileting    Toileting assist Assist for toileting: Moderate Assistance - Patient 50 - 74%     Transfers Chair/bed transfer  Transfers assist     Chair/bed transfer assist level: Minimal Assistance - Patient > 75%     Locomotion Ambulation   Ambulation assist      Assist level: Minimal Assistance - Patient > 75% Assistive device: Walker-rolling Max distance: 120 ft   Walk 10 feet activity   Assist     Assist level: Minimal Assistance - Patient > 75% Assistive device: Walker-rolling   Walk 50 feet activity   Assist Walk 50 feet with 2 turns activity did not occur: Safety/medical concerns  Assist level: Minimal Assistance - Patient > 75% Assistive device: Walker-rolling    Walk 150 feet activity   Assist Walk 150 feet activity did not occur: Safety/medical concerns         Walk 10 feet on uneven surface  activity   Assist Walk 10 feet on uneven surfaces activity did not occur: Safety/medical concerns         Wheelchair     Assist Will patient use wheelchair at discharge?: (TBD)  Wheelchair 50 feet with 2 turns activity    Assist            Wheelchair 150 feet activity     Assist            Medical Problem List and Plan: 1.  Altered mental status left hemiparesis secondary to right MCA infarction along with a small left CR and splenium infarct embolic pattern likely due to transient cardiomyopathy with low EF after cardiac catheterization for non-STEMI with stenting complicated by groin hematoma  Continue CIR  Repeat CT unremarkable for changes 2.   Antithrombotics: -DVT/anticoagulation: SCDs             -antiplatelet therapy: Plavix 3. Pain Management: Tylenol as needed  Voltaren gel to bilateral knees ordered on 4/27, with improvement  Voltaren gel added to left shoulder (history of rotator cuff repain), with improvement  Controlled with meds on 5/10 4. Mood: Amantadine 100 mg twice daily, DC'd secondary to hallucinations- resolved  Melatonin 3 mg nightly             -antipsychotic agents: agitation resolved d/c haldol 5. Neuropsych: This patient is not capable of making decisions on her own behalf. 6. Skin/Wound Care: Routine skin checks 7. Fluids/Electrolytes/Nutrition: Routine in and outs.   BMP within acceptable range on 4/28, labs ordered for tomorrow 8.  GI bleed/duodenal ulcer.  Status post EGD 10/08/2019 with epi and hemostasis clips x5.  PPI twice daily             See #9  Will follow up with GI, regarding decreasing Protonix 9.  Acute blood loss anemia.    Hemoglobin 9.8 on 5/7  Continue to monitor 10.  AKI.  Resolved.  11.  Acute urinary tract infection.  Patient completed 5-day course amoxicillin 10/17/2019 12.  Post stroke dysphagia.  Dysphagia #3 thins liquids.  Follow-up speech therapy             Advance diet as tolerated 13.  Keratoconjunctivitis.  Follow-up ophthalmology services.  Completed 7-day course of erythromycin drops on 4/17. 14.  Hypertension.     Increased Coreg to 6.25 mg BID with meals  Discussed with pharmacy, started on Losartan 12.5 on 4/22, increased to 25 mg, increased to 50 on 4/28, increased to 75 on 5/1 with eventual plans for Entresto  Elevated on 5/10 15.  Hyperlipidemia.  Lipitor 16.  Prediabetes.  Hemoglobin A1c 5.9.  SSI  Metformin 500 daily started on 5/4 CBG (last 3)  Recent Labs    11/10/19 1646 11/10/19 2112 11/11/19 0647  GLUCAP 152* 164* 156*   Mildly elevated on 5/10  Carb modified diet ordered 17. Acute systolic CHF   Filed Weights   11/07/19 0442 11/08/19 0358  11/11/19 0309  Weight: 68.2 kg 68 kg 67.6 kg   Stable on 5/10 18. Hypomagnesemia  Mag 1.5 on 5/7  Supplemented IV on 4/19, again on 4/23, again on 4/27, again on 5/5  Oral supplementation initiated, increased on 4/30, increased again on 5/5  Discussed with pharmacy potential offending agents  See #8 19.  Hypokalemia  Potassium 4.0 on 4/28, labs ordered for tomorrow   20. Mod/Severe Hypoalbuminemia  Supplement initiated 21.  Urinary frequency/Neurogenic bladder  PVRs ordered, only performed x1 with minimal residual  UA unremarkable, urine culture minimal growth, repeat UA relatively unremarkable   Keflex course completed on 4/29  Enablex started on 4/30, increased on 5/2  Flomax started on 5/5  Improving overall on 5/7  Melatonin started on  5/3 due to psychological component  22. Left shoulder OA  See #3, seems improved  History of rotator cuff repair  Xray personally reviewed and with patient, OA.  Calcific tendinosis.   Will consider steroid injection if pain recurs  Return to controlled on 5/10  LOS: 26 days A FACE TO FACE EVALUATION WAS PERFORMED  Ankit Karis Juba 11/11/2019, 9:12 AM

## 2019-11-11 NOTE — Progress Notes (Signed)
Occupational Therapy Session Note  Patient Details  Name: Brittney Kennedy MRN: [id number] Date of Birth: [birth year]  Today's Date: 11/11/2019 OT Individual Time: 0915-1000 OT Individual Time Calculation (min): 45 min    Short Term Goals: Week 4:  OT Short Term Goal 1 (Week 4): STGs = LTGs  Skilled Therapeutic Interventions/Progress Updates:  Pt received seated in w/c requesting to shower this session. Pt complete functional mobility into bathroom with RW and CGA. CGA for toileting this session as well as shower transfer to shower seat. Overall, pt requires CGA for bathing this session needing occasional cues to utilize LUE as gross assist throughout session.  Pt able to sit<>stand in shower this session to wash buttock via grab bar with MIN A for balance. Pt complete UB dressing with MIN A to orient front vs back of shirt and MIN A for LB dressing via sit<>stand for time mgmt. Pt able to don socks with set- up assist. Pt left at sink completing oral care with alarm belt activated and RN aware.   Therapy Documentation Precautions:  Precautions Precautions: Fall, Other (comment) Precaution Comments: aspiration precautions (HOB >30 degrees), significant L inattention/neglect, signifcant L hemiparesis (UE>LE with L shoulder subluxation) Restrictions Weight Bearing Restrictions: No General:   Vital Signs:  Pain: Pt with no c/o pain during session.   Therapy/Group: Individual Therapy  Angelina Pih 11/11/2019, 12:19 PM

## 2019-11-11 NOTE — Progress Notes (Signed)
Occupational Therapy Session Note  Patient Details  Name: Brittney Kennedy MRN: [id number] Date of Birth: 1950-09-08  Today's Date: 11/11/2019 OT Individual Time: 1300-1350 OT Individual Time Calculation (min): 50 min    Short Term Goals: Week 4:  OT Short Term Goal 1 (Week 4): STGs = LTGs  Skilled Therapeutic Interventions/Progress Updates:    Pt seen this session to address L visual scanning and LUE active use.  Pt received in w/c and taken to kitchen. At kitchen counter in a standing position, pt worked on L visual scanning tasks of finding different items in kitchen cupboard and on counter.  She was able to find items with no cues for approximately 80% of her visual field but for items fully on her left side she even had difficulty finding them when cued to scan to her L. To compensate, demonstrated to pt how she can use her R hand as a guide to find the end of the cabinet or counter but had minimal carryover due to decreased attention.  With practice, this should improve.   She did need a short sit down rest break and then stood to use B hands together to fold washcloths and towels.  She used L hand as a stabilizing A but needed A to use as an active A. She can grasp items in L hand but continues to have decreased attention to L arm.  Focused on isolated LUE movement patterns to gain increased shoulder control with A/AROM emphasizing sh abd and tricep extension to help her with her reaching.  Pt taken back to room to rest in wc until her next session.    Pt in chair with alarm on and all needs met.   Therapy Documentation Precautions:  Precautions Precautions: Fall, Other (comment) Precaution Comments: aspiration precautions (HOB >30 degrees), significant L inattention/neglect, signifcant L hemiparesis (UE>LE with L shoulder subluxation) Restrictions Weight Bearing Restrictions: No  Pain: Pain Assessment Pain Scale: 0-10 Pain Score: 3  Pain Type: Chronic pain Pain Location:  Arm Pain Orientation: Right;Left Pain Descriptors / Indicators: Discomfort Pain Frequency: Constant Pain Onset: On-going Patients Stated Pain Goal: 0 Pain Intervention(s): Repositioned ADL: ADL Grooming: Supervision/safety Where Assessed-Grooming: Sitting at sink Upper Body Bathing: Minimal assistance Where Assessed-Upper Body Bathing: Shower Lower Body Bathing: Minimal assistance Where Assessed-Lower Body Bathing: Shower Upper Body Dressing: Minimal assistance Where Assessed-Upper Body Dressing: Wheelchair Lower Body Dressing: Moderate assistance Where Assessed-Lower Body Dressing: Wheelchair Toileting: Moderate assistance Where Assessed-Toileting: Glass blower/designer: Psychiatric nurse Method: Counselling psychologist: Grab bars, Raised toilet seat Social research officer, government: Minimal assistance Social research officer, government Method: Heritage manager: Shower seat with back, Grab bars   Therapy/Group: Individual Therapy  [place] 11/11/2019, 10:14 AM

## 2019-11-11 NOTE — Progress Notes (Signed)
Speech Language Pathology Daily Session Note  Patient Details  Name: Brittney Kennedy MRN: [id number] Date of Birth: [birth year]  Today's Date: 11/11/2019 SLP Individual Time: 1030-1058 SLP Individual Time Calculation (min): 28 min  Short Term Goals: Week 4: SLP Short Term Goal 1 (Week 4): STG=LTG due to remaining LOS  Skilled Therapeutic Interventions: Pt was seen for skilled ST targeting cognition. Functional conversation utilized to target attention, recall, and intellectual awareness. Pt required overall Mod A verbal cues to identify 2 physical and 2 cognitive, 1 swallowing impairments. Pt demonstrated recall of last targeted ST session with Min A question cues, however decreased insight into level of difficulty of that task. Of note, pt used speech strategies with Min A verbal cues for clarification to achieve 90% intelligibility in conversation throughout session. She verbally recalled 2/3 strategies with Supervision A verbal cues. Pt required Min A verbal cues to sustain attention for 3 minute intervals today. Pt verbally recalled her room number independently and verbally navigated her way back to location of room with Min A question cues for recall but Mod A verbal cues for use of strategies for visual scanning to locate room number sign (on left of hall). Pt left sitting in chair with alarm set and needs within reach. Continue per current plan of care.         Pain Pain Assessment Pain Scale: 0-10 Pain Score: 0-No pain  Therapy/Group: Individual Therapy  Brittney Kennedy 11/11/2019, 7:02 AM

## 2019-11-11 NOTE — Progress Notes (Signed)
Physical Therapy Session Note  Patient Details  Name: Brittney Kennedy MRN: [id number] Date of Birth: 03-04-1951  Today's Date: 11/11/2019 PT Individual Time: 3419-6222 PT Individual Time Calculation (min): 73 min   Short Term Goals: Week 4:  PT Short Term Goal 1 (Week 4): STGs = LTGs due to ELOS  Skilled Therapeutic Interventions/Progress Updates:    Patient received up in [facility], pleasant and willing to participate in session today; able to complete all functional transfers and gait approximately 151f with RW/min guard to light MinA at most today, continues to require cues to attend to L side and clear L foot when gait training, also encouraged minimal distractions today as gait pattern and safety with transfers swiftly fall apart when she is distracted. Tolerated riding Nustep for 2 minutes on level 1 with BLEs due to hip pain, then tolerated gait training approximately [id number] with RW and Mod cues for L side management. Otherwise worked on PVWalt Disneyuzzle in standing with all puzzle pieces and picture of puzzle on L side with cues to improve L attention and weight shifting to L when reaching for puzzle pieces; needed skilled cues approximately 60-70% of the time to successfully complete puzzle today. [facility], seatbelt alarm active this afternoon.   Therapy Documentation Precautions:  Precautions Precautions: Fall, Other (comment) Precaution Comments: aspiration precautions (HOB >30 degrees), significant L inattention/neglect, signifcant L hemiparesis (UE>LE with L shoulder subluxation) Restrictions Weight Bearing Restrictions: No Pain: Pain Assessment Pain Scale: Faces Pain Score: 0-No pain Faces Pain Scale: Hurts a little bit Pain Type: Chronic pain Pain Location: Other (Comment)(R hip/L shoulder) Pain Orientation: Left;Right Pain Descriptors / Indicators: Discomfort Pain Onset: On-going Patients Stated Pain Goal: 0 Pain Intervention(s): Ambulation/increased  activity;Environmental changes;Emotional support;Distraction    Therapy/Group: Individual Therapy   KrWindell NorfolkDPT, PN1   Supplemental Physical Therapist CoMarlboro Meadows  Pager [phone] [phone]  11/11/2019, 3:50 PM

## 2019-11-12 ENCOUNTER — Encounter (HOSPITAL_COMMUNITY): Payer: PRIVATE HEALTH INSURANCE | Admitting: Speech Pathology

## 2019-11-12 ENCOUNTER — Inpatient Hospital Stay (HOSPITAL_COMMUNITY): Payer: PRIVATE HEALTH INSURANCE | Admitting: Physical Therapy

## 2019-11-12 ENCOUNTER — Ambulatory Visit (HOSPITAL_COMMUNITY): Payer: PRIVATE HEALTH INSURANCE | Admitting: Physical Therapy

## 2019-11-12 ENCOUNTER — Encounter (HOSPITAL_COMMUNITY): Payer: PRIVATE HEALTH INSURANCE | Admitting: Occupational Therapy

## 2019-11-12 LAB — BASIC METABOLIC PANEL
Anion gap: 11 (ref 5–15)
BUN: 16 mg/dL (ref 8–23)
CO2: 23 mmol/L (ref 22–32)
Calcium: 10.2 mg/dL (ref 8.9–10.3)
Chloride: 107 mmol/L (ref 98–111)
Creatinine, Ser: 0.83 mg/dL (ref 0.44–1.00)
GFR calc Af Amer: >60 mL/min (ref >60)
GFR calc non Af Amer: >60 mL/min (ref >60)
Glucose, Bld: 153 mg/dL — ABNORMAL HIGH (ref 70–99)
Potassium: 3.6 mmol/L (ref 3.5–5.1)
Sodium: 141 mmol/L (ref 135–145)

## 2019-11-12 LAB — GLUCOSE, CAPILLARY
Glucose-Capillary: 163 mg/dL — ABNORMAL HIGH (ref 70–99)
Glucose-Capillary: 133 mg/dL — ABNORMAL HIGH (ref 70–99)
Glucose-Capillary: 171 mg/dL — ABNORMAL HIGH (ref 70–99)

## 2019-11-12 MED ORDER — LOSARTAN POTASSIUM 50 MG PO TABS
100.0000 mg | ORAL_TABLET | Freq: Every day | ORAL | Status: DC
  Administered 2019-11-13 – 2019-11-14 (×2): 100 mg via ORAL
  Filled 2019-11-12 (×2): qty 2

## 2019-11-12 NOTE — Discharge Summary (Addendum)
Physician Discharge Summary  Patient ID: Brittney Kennedy MRN: [id number] DOB/AGE: April 06, 1951 69 y.o.  Admit date: 10/16/2019 Discharge date: 11/14/2019  Discharge Diagnoses:  Principal Problem:   Embolic infarction [facility]) Active Problems:   Right middle cerebral artery stroke (HCC)   Acute systolic congestive heart failure (HCC)   Essential hypertension   Keratoconjunctivitis   Hypomagnesemia   Labile blood pressure   Labile blood glucose   Hypokalemia   Hypoalbuminemia due to protein-calorie malnutrition (HCC)   Urinary frequency   Acute lower UTI   Neurogenic bladder   Chronic left shoulder pain   Osteoarthritis of left shoulder GI bleed/duodenal ulcer Dysphagia Keratoconjunctivitis Hyperlipidemia Prediabetes  Discharged Condition: Stable  Significant Diagnostic Studies: CT HEAD WO CONTRAST  Result Date: 10/26/2019 CLINICAL DATA:  Stroke.  Worsening confusion.  Follow-up. EXAM: CT HEAD WITHOUT CONTRAST TECHNIQUE: Contiguous axial images were obtained from the base of the skull through the vertex without intravenous contrast. COMPARISON:  10/09/2019 FINDINGS: Brain: Evolutionary changes of infarction in the right posterior temporal lobe and parietal lobe. Hemorrhage and/or calcification within the gyri of the region consistent with laminar necrosis. No evidence of frank focal hematoma or mass effect. Elsewhere, the brain shows chronic small-vessel ischemic changes of the white matter. Other small areas of acute infarction demonstrated on 10/09/2019 are not specifically visible. No hydrocephalus. No extra-axial collection. Vascular: There is atherosclerotic calcification of the major vessels at the base of the brain. 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 This could be a combination of blood  products and calcification. No focal hematoma. No mass effect. No newly seen regional insult. Electronically Signed   By: Nelson Chimes M.D.   On: 10/26/2019 15:49   DG Shoulder Left  Result Date: 11/07/2019 CLINICAL DATA:  Pain and weakness EXAM: LEFT SHOULDER - 2+ VIEW COMPARISON:  None. FINDINGS: Frontal, oblique, and Y scapular images were obtained. No acute fracture or dislocation. There is moderate narrowing of the acromioclavicular joint. There is widening of the acromioclavicular joint suggesting acromioclavicular separation. There is no overt coracoclavicular separation. Small foci of calcification are noted in the lateral aspect of the joint. Visualized left lung clear. IMPRESSION: Osteoarthritic change, primarily in the glenohumeral joint. Suspect a degree of acromioclavicular separation on the left. No fracture or frank dislocation. Calcification in the lateral aspect of the shoulder joint is likely due to calcific tendinosis. Electronically Signed   By: Lowella Grip III M.D.   On: 11/07/2019 10:40   DG Swallowing Func-Speech Pathology  Result Date: 10/29/2019 Objective Swallowing Evaluation: Type of Study: MBS-Modified Barium Swallow Study  Patient Details Name: Brittney Kennedy MRN: [id number] Date of Birth: 1951/04/27 Today's Date: 10/29/2019 Past Medical History: Past Medical History: Diagnosis Date  CAD (coronary artery disease)   a. 09/2019 NSTEMI/Cath: LM nl, LAD 90p, 108m diffuse mid-dist dzs, LCX 941mV groove/OM2, RI 90p, RCA dominant, nl, RPDA 901mF 25%, glob HK.  HFrEF (heart failure with reduced ejection fraction) [place] a. 09/2019 LV gram: EF 25%, global HK.  PUD (peptic ulcer disease)   Seasonal allergies  Past Surgical History: Past Surgical History: Procedure Laterality Date  CHOLECYSTECTOMY    CORONARY STENT INTERVENTION N/A 09/30/2019  Procedure: CORONARY STENT INTERVENTION;  Surgeon: JorMartiniqueeter M, MD;  Location: [facility];  Service: Cardiovascular;  Laterality: N/A;   ESOPHAGOGASTRODUODENOSCOPY (EGD) WITH PROPOFOL N/A 10/08/2019  Procedure: ESOPHAGOGASTRODUODENOSCOPY (EGD) WITH PROPOFOL;  Surgeon: Yetta Flock, MD;  Location: [place];  Service: Gastroenterology;  Laterality: N/A;  HEMOSTASIS CLIP PLACEMENT  10/08/2019  Procedure: HEMOSTASIS CLIP PLACEMENT;  Surgeon: Yetta Flock, MD;  Location: [place];  Service: Gastroenterology;;  HEMOSTASIS CONTROL  10/08/2019  Procedure: HEMOSTASIS CONTROL;  Surgeon: Yetta Flock, MD;  Location: [place];  Service: Gastroenterology;;  LEFT HEART CATH AND CORONARY ANGIOGRAPHY N/A 09/30/2019  Procedure: LEFT HEART CATH AND CORONARY ANGIOGRAPHY;  Surgeon: Martinique, Peter M, MD;  Location: [place] [facility];  Service: Cardiovascular;  Laterality: N/A;  LEFT HEART CATH AND CORS/GRAFTS ANGIOGRAPHY N/A 09/27/2019  Procedure: LEFT HEART CATH AND CORS/GRAFTS ANGIOGRAPHY;  Surgeon: Minna Merritts, MD;  Location: [place] [facility];  Service: Cardiovascular;  Laterality: N/A; HPI: 69 y/o female with hx of CAD, cholecystectomy, presented to ED with nausea/vomiting, epigastric pain for 1 week intermittently.  Admitted for NSTEMI, cath 3/26 revealed severe multivessel CAD and transferred to [facility].  Fall 3/27 with R groin hematoma.  Found AMS 3/28 due to pain meds.  Rapid response 3/29 due to AMS and L facial droop/weakness. CT revealed right basal ganglia infarct.  3/29 pm with worsening deficits; STAT CT head with right parietal infarct.  Assessment / Plan / Recommendation CHL IP CLINICAL IMPRESSIONS 10/29/2019 Clinical Impression --Pt presents with moderate oropharyngeal dysphagia most remarkable for deep penetration and trace aspiration (PAS scale 5 and 7) of thin barium without use of compensatory strategies due to incomplete laryngeal closure. Pt's swallow initiation is much more timely today in comparison to last MBSS, with delay to pyriform sinuses only observed on very first presentation of thin. She also  demonstrated greatly improved ability to execute a left head turn prior to swallow, which prevented aspiration in 100% of trials of thin barium with use of this strategy. Recommend pt upgrade to thin liquids but must turn head to LEFT with every sip, use slow rate, small bite/sips, continue dysphagia 2 solids and pills in puree. Pt will require full supervision to ensure use of compensatory swallow strategies.  SLP Visit Diagnosis Dysphagia, oropharyngeal phase (R13.12) Attention and concentration deficit following -- Frontal lobe and executive function deficit following -- Impact on safety and function Mild aspiration risk   CHL IP TREATMENT RECOMMENDATION 10/18/2019 Treatment Recommendations Therapy as outlined in treatment plan below   Prognosis 10/05/2019 Prognosis for Safe Diet Advancement Good Barriers to Reach Goals Severity of deficits Barriers/Prognosis Comment -- CHL IP DIET RECOMMENDATION 10/29/2019 SLP Diet Recommendations Dysphagia 2 (Fine chop) solids;Thin liquid Liquid Administration via Cup Medication Administration Whole meds with puree Compensations Minimize environmental distractions;Slow rate;Small sips/bites Postural Changes Remain semi-upright after after feeds/meals (Comment);Seated upright at 90 degrees   CHL IP OTHER RECOMMENDATIONS 10/29/2019 Recommended Consults -- Oral Care Recommendations Oral care BID Other Recommendations --   CHL IP FOLLOW UP RECOMMENDATIONS 10/18/2019 Follow up Recommendations Inpatient Rehab   CHL IP FREQUENCY AND DURATION 10/05/2019 Speech Therapy Frequency (ACUTE ONLY) min 3x week Treatment Duration 2 weeks      CHL IP ORAL PHASE 10/29/2019 Oral Phase Impaired Oral - Pudding Teaspoon -- Oral - Pudding Cup -- Oral - Honey Teaspoon -- Oral - Honey Cup -- Oral - Nectar Teaspoon -- Oral - Nectar Cup -- Oral - Nectar Straw -- Oral - Thin Teaspoon -- Oral - Thin Cup Weak lingual manipulation;Lingual pumping;Premature spillage Oral - Thin Straw NT Oral - Puree NT Oral - Mech Soft  NT Oral - Regular -- Oral -  Multi-Consistency -- Oral - Pill NT Oral Phase - Comment --  CHL IP PHARYNGEAL PHASE 10/29/2019 Pharyngeal Phase Impaired Pharyngeal- Pudding Teaspoon -- Pharyngeal -- Pharyngeal- Pudding Cup -- Pharyngeal -- Pharyngeal- Honey Teaspoon NT Pharyngeal -- Pharyngeal- Honey Cup NT Pharyngeal -- Pharyngeal- Nectar Teaspoon NT Pharyngeal -- Pharyngeal- Nectar Cup NT Pharyngeal -- Pharyngeal- Nectar Straw NT Pharyngeal -- Pharyngeal- Thin Teaspoon NT Pharyngeal -- Pharyngeal- Thin Cup Reduced airway/laryngeal closure;Penetration/Aspiration during swallow;Trace aspiration;Compensatory strategies attempted (with notebox) Pharyngeal Material enters airway, passes BELOW cords and not ejected out despite cough attempt by patient;Material enters airway, CONTACTS cords and not ejected out Pharyngeal- Thin Straw NT Pharyngeal -- Pharyngeal- Puree NT Pharyngeal -- Pharyngeal- Mechanical Soft NT Pharyngeal -- Pharyngeal- Regular -- Pharyngeal -- Pharyngeal- Multi-consistency -- Pharyngeal -- Pharyngeal- Pill NT Pharyngeal -- Pharyngeal Comment --  CHL IP CERVICAL ESOPHAGEAL PHASE 10/29/2019 Cervical Esophageal Phase WFL Pudding Teaspoon -- Pudding Cup -- Honey Teaspoon -- Honey Cup -- Nectar Teaspoon -- Nectar Cup -- Nectar Straw -- Thin Teaspoon -- Thin Cup -- Thin Straw -- Puree -- Mechanical Soft -- Regular -- Multi-consistency -- Pill -- Cervical Esophageal Comment -- Brittney Kennedy 10/29/2019, 12:56 PM              DG Swallowing Func-Speech Pathology  Result Date: 10/18/2019 Objective Swallowing Evaluation: Type of Study: MBS-Modified Barium Swallow Study  Patient Details Name: Brittney Kennedy MRN: [id number] Date of Birth: 12/20/1950 Today's Date: 10/18/2019 Time: SLP Start Time (ACUTE ONLY): 1115 -SLP Stop Time (ACUTE ONLY): 1125 SLP Time Calculation (min) (ACUTE ONLY): 10 min Past Medical History: Past Medical History: Diagnosis Date  CAD (coronary artery disease)   a. 09/2019 NSTEMI/Cath: LM nl, LAD  90p, 75m diffuse mid-dist dzs, LCX 940mV groove/OM2, RI 90p, RCA dominant, nl, RPDA 9077mF 25%, glob HK.  HFrEF (heart failure with reduced ejection fraction) ([place] a. 09/2019 LV gram: EF 25%, global HK.  PUD (peptic ulcer disease)   Seasonal allergies  Past Surgical History: Past Surgical History: Procedure Laterality Date  CHOLECYSTECTOMY    CORONARY STENT INTERVENTION N/A 09/30/2019  Procedure: CORONARY STENT INTERVENTION;  Surgeon: JorMartiniqueeter M, MD;  [facility];  Service: Cardiovascular;  Laterality: N/A;  ESOPHAGOGASTRODUODENOSCOPY (EGD) WITH PROPOFOL N/A 10/08/2019  Procedure: ESOPHAGOGASTRODUODENOSCOPY (EGD) WITH PROPOFOL;  Surgeon: ArmYetta FlockD;  [facility];  Laterality: N/A;  HEMOSTASIS CLIP PLACEMENT  10/08/2019  Procedure: HEMOSTASIS CLIP PLACEMENT;  Surgeon: ArmYetta FlockD;  [facility];;  HEMOSTASIS CONTROL  10/08/2019  Procedure: HEMOSTASIS CONTROL;  Surgeon: ArmYetta FlockD;  [facility];;  LEFT HEART CATH AND CORONARY ANGIOGRAPHY N/A 09/30/2019  Procedure: LEFT HEART CATH AND CORONARY ANGIOGRAPHY;  Surgeon: JorMartiniqueeter M, MD;  [facility];  Service: Cardiovascular;  Laterality: N/A;  LEFT HEART CATH AND CORS/GRAFTS ANGIOGRAPHY N/A 09/27/2019  Procedure: LEFT HEART CATH AND CORS/GRAFTS ANGIOGRAPHY;  Surgeon: GolMinna MerrittsD;  [facility];  Service: Cardiovascular;  Laterality: N/A; HPI: [age]o female with hx of CAD, cholecystectomy, presented to ED with nausea/vomiting, epigastric pain for 1 week intermittently.  Admitted for NSTEMI, cath 3/26 revealed severe multivessel [facility]  Found AMS 3/28 due to pain meds.  Rapid response 3/29 due to AMS and L facial droop/weakness. CT revealed right basal ganglia infarct.  3/29 pm with worsening  deficits;  STAT CT head with right parietal infarct.  Subjective: frequently needed cues to keep head still during exam Assessment / Plan / Recommendation CHL IP CLINICAL IMPRESSIONS 10/18/2019 Clinical Impression Pt continues to demonstrates a primarily sensory based pharyngeal dysphagia with mild oral phase deficits noted. Orally pt exhibited left anterior spillage iwth thin liquids via cup. Overall she was effecient with bolus cohesion and improved posterior propulsion. She continues to exhibit a delay to pyriform sinuses with both nectar thick liquids and thin liquids. When consuming larger thin liquid boluses via cup or straw, pt consistently penetrates all boluses and aspiration x 2. Pt exhibited delayed cough after aspriation events but cough was too delayed to clear aspirates. Pt was not able to effectively replicate small sips d/t cognitive deficits. Pt's positioning also freqeuntly changes d/t restlessness and attention deficits. Penetration and aspiraiton were prevented with left head turn. Pt unable to perform without SLP holding cup and straw close to pt's left shoulder when consuming. At this time, recommend pt continue dysphagia 1 diet with nectar thick liquids, medicine whole in puree, full supervision, water protocal with left head turn targeted within skilled ST sessions. SLP Visit Diagnosis Dysphagia, oropharyngeal phase (R13.12) Attention and concentration deficit following -- Frontal lobe and executive function deficit following -- Impact on safety and function Mild aspiration risk   CHL IP TREATMENT RECOMMENDATION 10/18/2019 Treatment Recommendations Therapy as outlined in treatment plan below   Prognosis 10/05/2019 Prognosis for Safe Diet Advancement Good Barriers to Reach Goals Severity of deficits Barriers/Prognosis Comment -- CHL IP DIET RECOMMENDATION 10/18/2019 SLP Diet Recommendations Dysphagia 1 (Puree) solids;Nectar thick liquid Liquid Administration via Cup Medication Administration Whole  meds with puree Compensations Minimize environmental distractions;Slow rate;Small sips/bites Postural Changes Seated upright at 90 degrees   CHL IP OTHER RECOMMENDATIONS 10/18/2019 Recommended Consults -- Oral Care Recommendations Oral care BID Other Recommendations Order thickener from pharmacy;Prohibited food (jello, ice cream, thin soups);Remove water pitcher;Have oral suction available;Clarify dietary restrictions   CHL IP FOLLOW UP RECOMMENDATIONS 10/18/2019 Follow up Recommendations Inpatient Rehab   CHL IP FREQUENCY AND DURATION 10/05/2019 Speech Therapy Frequency (ACUTE ONLY) min 3x week Treatment Duration 2 weeks      CHL IP ORAL PHASE 10/18/2019 Oral Phase Impaired Oral - Pudding Teaspoon -- Oral - Pudding Cup -- Oral - Honey Teaspoon -- Oral - Honey Cup -- Oral - Nectar Teaspoon -- Oral - Nectar Cup -- Oral - Nectar Straw -- Oral - Thin Teaspoon -- Oral - Thin Cup Left anterior bolus loss;Decreased bolus cohesion;Premature spillage Oral - Thin Straw Decreased bolus cohesion;Premature spillage Oral - Puree NT Oral - Mech Soft Weak lingual manipulation Oral - Regular -- Oral - Multi-Consistency -- Oral - Pill NT Oral Phase - Comment --  CHL IP PHARYNGEAL PHASE 10/18/2019 Pharyngeal Phase Impaired Pharyngeal- Pudding Teaspoon -- Pharyngeal -- Pharyngeal- Pudding Cup -- Pharyngeal -- Pharyngeal- Honey Teaspoon NT Pharyngeal -- Pharyngeal- Honey Cup NT Pharyngeal -- Pharyngeal- Nectar Teaspoon NT Pharyngeal -- Pharyngeal- Nectar Cup Delayed swallow initiation-pyriform sinuses Pharyngeal -- Pharyngeal- Nectar Straw NT Pharyngeal -- Pharyngeal- Thin Teaspoon NT Pharyngeal -- Pharyngeal- Thin Cup Delayed swallow initiation-pyriform sinuses;Penetration/Aspiration during swallow Pharyngeal Material enters airway, passes BELOW cords without attempt by patient to eject out (silent aspiration) Pharyngeal- Thin Straw Delayed swallow initiation-pyriform sinuses;Penetration/Aspiration during swallow Pharyngeal Material enters  airway, remains ABOVE vocal cords and not ejected out Pharyngeal- Puree NT Pharyngeal -- Pharyngeal- Mechanical Soft WFL Pharyngeal -- Pharyngeal- Regular -- Pharyngeal -- Pharyngeal- Multi-consistency -- Pharyngeal -- Pharyngeal- Pill NT Pharyngeal --  Pharyngeal Comment --  CHL IP CERVICAL ESOPHAGEAL PHASE 10/18/2019 Cervical Esophageal Phase WFL Pudding Teaspoon -- Pudding Cup -- Honey Teaspoon -- Honey Cup -- Nectar Teaspoon -- Nectar Cup -- Nectar Straw -- Thin Teaspoon -- Thin Cup -- Thin Straw -- Puree -- Mechanical Soft -- Regular -- Multi-consistency -- Pill -- Cervical Esophageal Comment -- Brittney Kennedy 10/18/2019, 1:54 PM               Labs:  Basic Metabolic Panel: Recent Labs  Lab 11/08/19 0647 11/12/19 0509 11/13/19 0552  NA  --  141  --   K  --  3.6  --   CL  --  107  --   CO2  --  23  --   GLUCOSE  --  153*  --   BUN  --  16  --   CREATININE  --  0.83  --   CALCIUM  --  10.2  --   MG 1.5*  --  1.2*    CBC: Recent Labs  Lab 11/08/19 0647  WBC 7.1  NEUTROABS 3.6  HGB 9.8*  HCT 30.5*  MCV 95.0  PLT 215    CBG: Recent Labs  Lab 11/13/19 0611 11/13/19 1148 11/13/19 1658 11/13/19 2119 11/14/19 0625  GLUCAP 141* 159* 117* 127* 146*   Family history.  Mother with CVA.  Father with multiple myeloma.  Sister with multiple myeloma.  Denies any esophageal rectal or colon cancer  Brief HPI:   Brittney Kennedy is a 69 y.o. right-handed female with unremarkable past medical history.  Patient independent prior to admission living in a 1 level home.  She lives in the [place] area with a friend.  Presented 09/27/2019 with intermittent nausea vomiting x1 week as well as epigastric substernal pain.  Admission chemistries potassium 3.0, troponin high-sensitivity 674, SARS coronavirus negative.  EKG question old septal infarct with lateral ST-T changes.  CT abdomen pelvis negative for acute intra-abdominal or pelvic pathology.  She was placed on intravenous heparin.  Cardiac  catheterization revealed severe multivessel CAD with LV dysfunction, ejection fraction 25%.  She was stented.  Acute systolic heart failure due to an ICM and follow-up management per cardiology services.  She did develop right groin hematoma after catheterization maintained on Lovenox as well as low-dose aspirin.  Neurology consulted 09/30/2019 for lethargy left facial droop left upper extremity weakness.  Cranial CT scan showed right basal ganglia infarction as well as remote appearing right pontine infarction.  CT angiogram of the neck moderate stenosis distal right M1 segment.  Moderate to severe stenosis inferior branch of right middle cerebral artery.  No large vessel occlusion.  Follow-up CT of the head 10/01/2019 showed acute infarct right parietal lobe age-indeterminate infarction right caudate.  EEG negative for seizure.  Plavix added to patient's prophylaxis for CVA.  Gastroenterology services consulted 10/08/2019 for melena BUN markedly elevated 49-50, hemoglobin dipped to 8.7 down to 7.3, WBC 17,700.  EGD upper endoscopy completed 10/08/2019 per Dr. Havery Moros showed actively bleeding duodenal ulcer with visible vessel treated with epi and hemostasis clips x5.  Latest hemoglobin stable 8.4 on 412 and renal function improved BUN 9 creatinine 0.81.  GI had recommended PPI twice daily and outpatient follow-up.  Patient's aspirin discontinued remained on Plavix.  Hospital course further complicated by UTI urine culture greater 100,000 Aerococcus viridans and patient completed 5-day course amoxicillin.  Ophthalmology consulted 10/12/2019 Dr. Dennison Mascot for keratoconjunctivitis who recommended erythromycin ophthalmic 4 times daily x7 days and warm compresses.  She was on a dysphagia #1 nectar thick liquid diet.  Therapy evaluations completed and patient was admitted for a comprehensive rehab program.   Hospital Course: Kynzley Dowson was admitted to rehab 10/16/2019 for inpatient therapies to consist of PT, ST  and OT at least three hours five days a week. Past admission physiatrist, therapy team and rehab RN have worked together to provide customized collaborative inpatient rehab.  Hospital course in regards to patient's right MCA infarction along with a small left corona radiata and splenium infarct embolic pattern likely due to transient cardiomyopathy low ejection fraction after cardiac catheterization for non-STEMI with stenting complicated by groin hematoma remained stable she would follow neurology services currently maintained on Plavix therapy.  Pain managed with use of Voltaren gel bilateral knees left shoulder with noted history of rotator cuff repair.  Mood stabilization with melatonin for sleep early hospital course Haldol needed for some agitation.  She had been placed on amantadine later discontinued due to some hallucinations that did resolve.  DVT prophylaxis with SCDs.  Hospital course GI bleed duodenal ulcer EGD 10/08/2019 with epi and hemostasis clips x5 PPI twice daily as per gastroenterology services that she would need to maintain until follow-up outpatient.  Acute blood loss anemia stable latest hemoglobin 9.8 and no bleeding episodes.  She had completed a 5-day course amoxicillin for acute UTI denying any other dysuria however she did have some urinary frequency placed on Enablex close monitoring of PVR she remained on low-dose Flomax..  Her diet was advanced to a mechanical soft thin liquid followed by speech therapy.  She had completed a 7-day course erythromycin drops for keratoconjunctivitis and would follow-up outpatient ophthalmology as needed.  Her blood pressure remained controlled on Cozaar titrated to 75 mg daily as well as Coreg 6.25 mg twice daily she would follow-up with her PCP as well as cardiology services.  There was some discussion of initiating Entresto as an outpatient.  Lipitor ongoing for hyperlipidemia.  Prediabetes hemoglobin A1c 5.9 currently maintained on Metformin carb  modified diet.  Hypomagnesemia she did receive supplemental intravenous transition to oral and again would need follow-up outpatient.   Blood pressures were monitored on TID basis and soft and controlled  Diabetes has been monitored with ac/hs CBG checks and SSI was use prn for tighter BS control.   Brittney Kennedy has made gains during rehab stay and is attending therapies  Brittney Kennedy will continue to receive follow up therapies   after discharge  Rehab course: During patient's stay in rehab weekly team conferences were held to monitor patient's progress, set goals and discuss barriers to discharge. At admission, patient required total assist supine to sit, max assist sit to supine, +2 physical assist sit to stand.  Moderate assist upper body bathing max is lower body bathing mod assist upper body dressing max is lower body dressing max assist toilet transfers  Physical exam.  Blood pressure 119/64 pulse 70 temperature 95 respirations 18 oxygen saturation 1% room air Constitutional well-developed well-nourished HEENT Head.  Normocephalic and atraumatic Eyes.  Pupils round and reactive to light no discharge.nystagmus Neck.  Supple nontender no JVD without thyromegaly Cardiac regular rate rhythm without any extra sounds or murmur heard Abdomen.  Soft nontender positive bowel sounds without rebound Respiratory effort normal no respiratory distress without wheeze Musculoskeletal no edema or tenderness in extremities Neurological.  Alert follow commands dysarthric speech limited medical historian. Motor.  Right upper extremity 5/5 proximal to distal Left upper extremity 0/5 proximal to distal Bilateral lower  extremities question 2/5 proximal distal limited due to participation.  Brittney Kennedy  has had improvement in activity tolerance, balance, postural control as well as ability to compensate for deficits. Brittney Kennedy has had improvement in functional use RUE/LUE  and RLE/LLE as well as improvement in awareness.  Patient  was able to complete all functional transfers and gait approximate 125 feet rolling walker minimal guard to light minimal assist.  Continues to require cues to attend the left side and clear left foot when gait training also encourage minimal distractions during gait.  Patient completed functional ability into the bathroom rolling walker contact-guard assist.  Contact-guard assist for toileting during session as well as shower transfer to shower seat.  Overall patient required contact-guard for bathing needing occasional cues to utilize left upper extremity as gross assist throughout sessions.  Patient able to sit stand and shower for session to wash buttocks minimal assist for balance.  Completed upper body dressing minimal assist to orient front versus back of shirt minimal assist for lower body dressing via sit to stand for time management.  Patient able to don socks with set up assist.  Patient was seen by speech therapy for functional conversation utilized to target attention and recall and intellectual awareness.  Required overall moderate verbal cues to identify 2 physical and to cognitive.  Demonstrate a recall of her last targeted ST session.  Required minimal verbal cues to sustain attention for 3 minutes.  It was discussed with family her need for supervision on discharge.  Full family teaching completed and discharged home       Disposition: Discharged home    Diet: Mechanical soft thin liquids  Special Instructions: No driving smoking or alcohol  Medications at discharge 1.  Tylenol as needed 2.  Lipitor 80 mg p.o. daily 3.  Coreg 6.25 mg p.o. twice daily 4.  Plavix 75 mg p.o. daily 5.  Enablex 15 mg p.o. nightly 6.  Voltaren gel 2 g 4 times daily 7.  Colace 100 mg p.o. twice daily 8.  Cozaar 75 mg p.o. daily 9.  Magnesium gluconate 500 mg p.o. 3 times daily 10.  Melatonin 1.5 mg p.o. nightly 11.  Glucophage 500 mg p.o. daily 12.  Multivitamin daily 13.  Nitroglycerin as  needed 14.  Protonix 40 mg p.o. twice daily 15.  Polyvinyl ophthalmic solution 1.4% 1 drop both eyes 3 times daily as needed 16.  Flomax 0.4 mg p.o. nightly   30-35 minutes were used in completion of discharge summary and discharge planning  Discharge Instructions     Ambulatory referral to Neurology   Complete by: As directed    An appointment is requested in approximately 4 weeks right MCA infarction   Ambulatory referral to Physical Medicine Rehab   Complete by: As directed    Moderate complexity follow-up 1 to 2 weeks right MCA infarction       Follow-up Information     Jamse Arn, MD Follow up.   Specialty: Physical Medicine and Rehabilitation Why: Office to call for appointment Contact information: [place] [address] [phone]         Yetta Flock, MD Follow up.   Specialty: Gastroenterology Why: Call for appointment Contact information: [place] [address] [phone]         Lonia Skinner, MD Follow up.   Specialty: Ophthalmology Why: Call for appointment Contact information: [place] [address] [phone]  Minna Merritts, MD Follow up.   Specialty: Cardiology Why: Call for appointment Contact information: [place] [address] [phone]         Jolaine Artist, MD Follow up on 11/28/2019.   Specialty: Cardiology Why: Located at Entrance C at [facility]. On the 1st floor.  at 10:30. Garage Code [employer] information: [place] [address] [phone]            Signed: Cathlyn Parsons 11/14/2019, 8:56 AM Patient was seen, face-face, and physical exam performed by me on day of discharge, greater than 30 minutes of total time spent.. Please see progress note from day of discharge as well.  Delice Lesch, MD, ABPMR

## 2019-11-12 NOTE — Progress Notes (Signed)
Speech Language Pathology Discharge Summary  Patient Details  Name: Brittney Kennedy MRN: [id number] Date of Birth: [birth year]  Today's Date: 11/13/2019 SLP Individual Time: 6553-7482 SLP Individual Time Calculation (min): 43 min   Skilled Therapeutic Interventions:  Pt was seen for skilled ST targeting cognition and finishing education with pt's family. SLP facilitated session with administration of MOCA version 7.1 as means of post-test for pt's current cognitive-linguistic function. Pt scored 21/30 (WNL = 26 or above), with most noteworthy deficits noted in sustained attention, problem solving, and short term memory. Pt required Min A verbal cues for redirection in 5-7 minute intervals throughout assessment. She did verbalize good insight into deficits in memory and visual scanning. Verbal cues provided for awareness of attention and problem solving impairments. Answered questions from pt's son regarding home activities and follow up ST focus. Pt left sitting in chair with alarm set and needs within reach, family and interpretor still present. Continue per current plan of care.     Patient has met 8 of 8 long term goals.  Patient to discharge at overall Min;Mod level.  Reasons goals not met: n/a   Clinical Impression/Discharge Summary:   Pt made slow but functional gains and met 8 out of 8 long term goals this admission. Some goals related to pt's cognitive functioning were downgraded throughout stay due to slower than anticipated progress. Pt currently requires Min-Mod assist for basic cognitive tasks due to impairments impacting her sustained attention, short term memory, emergent awareness, and basic problem solving. She will require 24/7 supervision at discharge. Pt is consuming a dysphagia 3 (mechanical soft) diet with thin liquids. She is using strategies for oral clearance of solids with only Supervision verbal cues, but increased Min A in a quiet environment is required for pt to accurately  implement left head turn with thin liquid intake. Pt is using increased vocal intensity, slow rate, and overarticulation to increase speech intelligibility to ~90% in conversation with only Min A verbal cues. Pt has demonstrated improved speech intelligibility, oral swallow function and ability to use swallow strategies to compensate for pharyngeal impairments with thins, as well as sustained attention and basic problem solving. However, given dysphagia, dysarthria, and cognitive deficits still present, recommend pt continue to receive skilled ST services upon discharge. Pt and family education is complete at this time.    Care Partner:  Caregiver Able to Provide Assistance: Yes  Type of Caregiver Assistance: Cognitive  Recommendation:  Home Health SLP;24 hour supervision/assistance  Rationale for SLP Follow Up: Maximize functional communication;Maximize cognitive function and independence;Reduce caregiver burden;Maximize swallowing safety   Equipment: none   Reasons for discharge: Discharged from hospital   Patient/Family Agrees with Progress Made and Goals Achieved: Yes    Arbutus Leas 11/13/2019, 6:56 AM

## 2019-11-12 NOTE — Progress Notes (Signed)
Occupational Therapy Session Note  Patient Details  Name: Brittney Kennedy MRN: [id number] Date of Birth: [birth year]  Today's Date: 11/12/2019 OT Individual Time: 1115-1220 OT Individual Time Calculation (min): 65 min    Short Term Goals: Week 4:  OT Short Term Goal 1 (Week 4): STGs = LTGs  Skilled Therapeutic Interventions/Progress Updates:    Pt seen this session for family education with her son and caregiver (sister in law) along with interpretor for the sister in law.   Pt received in wc and taken to ADL apartment.  Pt demonstrated: - bed mobility onto a 28 inch high mattress with CGA to get into bed and S to get out of bed.  Pt's bed is only 26 inches at home. Pt will need CGA to scoot back onto mattress. Son will purchase a bed rail pt can use for support. - stepping backwards into shower stall with RW with CGA.  Reviewed her shower stall size and son will place a grab bar on her R side. She has a sturdy shower seat in place.  -HEP of L visual scanning and compensatory techniques and LUE AROM exercises. Handouts provided. Pt demonstrated how she performs the exercises and visual scanning with cues for compensation by using her hand as a guide, or turning her head or using the light house technique. Also explained that they could use visual markers such as sticky pads to guide her.   Pt and family participated well.  Her caregiver feels comfortable assisting her and even helped her toilet earlier today.    Pt taken back to room. Lap tray, belt alarm on, call light in reach.  Explained to family that we will order a lap tray with her wc to support her L arm.    Therapy Documentation Precautions:  Precautions Precautions: Fall, Other (comment) Precaution Comments: aspiration precautions (HOB >30 degrees), significant L inattention/neglect, signifcant L hemiparesis (UE>LE with L shoulder subluxation) Restrictions Weight Bearing Restrictions: No  Pain: Pain Assessment Pain Scale:  Faces Pain Score: 0-No pain Faces Pain Scale: No hurt ADL: ADL Grooming: Supervision/safety Where Assessed-Grooming: Sitting at sink Upper Body Bathing: Minimal assistance Where Assessed-Upper Body Bathing: Shower Lower Body Bathing: Minimal assistance Where Assessed-Lower Body Bathing: Shower Upper Body Dressing: Minimal assistance Where Assessed-Upper Body Dressing: Wheelchair Lower Body Dressing: Moderate assistance Where Assessed-Lower Body Dressing: Wheelchair Toileting: Moderate assistance Where Assessed-Toileting: Teacher, adult education: Curator Method: Proofreader: Grab bars, Raised toilet seat Film/video editor: Minimal assistance Film/video editor Method: Designer, industrial/product: Shower seat with back, Grab bars   Therapy/Group: Individual Therapy  Ledarius Leeson 11/12/2019, 1:03 PM

## 2019-11-12 NOTE — Progress Notes (Addendum)
Advanced Heart Failure Rounding Note  PCP-Cardiologist: Julien Nordmann, MD   Subjective:   cath 09/27/19 3v CAD. EF 20% - right groin hematoma. U/s 3/27 negative for PSA - 3/28 altered mental status due to pain meds. Head CT, ABG and ammonia normal - 09/30/19 CVA, ACS with DES LAD and mid CX - 3/30 Headache with stat CT. EEG- no seizure, cortical dysfunction in the right hemisphere likely secondary to underlying infarct as well as moderate to severe diffuse encephalopathy, nonspecific to etiology. - 3/30 Echo EF 50-55% - 4/6 developed GIB. Hgb 7.3. Transfused x 2 units. Urgent EGD 4/6 showed an actively bleeding duodenal bulb ulcer with visible vessel, treated with epi and hemostasis clips x 5. Hemostasis achieved  -4/7 Got another unit of RBCs. Hgb up to 9.1. Placed on protonix drip.   Much improved. Able to walk 125 feet with min assist using rolling walker. Denies SOB. Denies chest pain.   Objective:   Weight Range: 67.6 kg Body mass index is 27.26 kg/m.   Vital Signs:   Temp:  [98.1 F (36.7 C)-98.4 F (36.9 C)] 98.1 F (36.7 C) (05/11 0320) Pulse Rate:  [81-87] 84 (05/11 0320) Resp:  [16-18] 18 (05/11 0320) BP: (120-147)/(59-74) 147/67 (05/11 0320) SpO2:  [96 %-100 %] 96 % (05/11 0320) Weight:  [67.6 kg] 67.6 kg (05/11 0320) Last BM Date: 11/09/19  Weight change: Filed Weights   11/08/19 0358 11/11/19 0309 11/12/19 0320  Weight: 68 kg 67.6 kg 67.6 kg    Intake/Output:   Intake/Output Summary (Last 24 hours) at 11/12/2019 1013 Last data filed at 11/11/2019 2315 Gross per 24 hour  Intake 600 ml  Output 150 ml  Net 450 ml      Physical Exam    General:  Sitting on the side of the bed. No resp difficulty HEENT: normal anicteric  Neck: supple. no JVD. Carotids 2+ bilat; no bruits. No lymphadenopathy or thryomegaly appreciated. Cor: PMI nondisplaced. Regular rate & rhythm. No rubs, gallops or murmurs. Lungs: clear no wheeze Abdomen: soft, nontender,  nondistended. No hepatosplenomegaly. No bruits or masses. Good bowel sounds. Extremities: no cyanosis, clubbing, rash, edema Neuro: alert & orientedx3, cranial nerves grossly intact. moves all 4 extremities . LUE weak. Some pain with left upper extremity movement. . Affect pleasant   Labs    CBC No results for input(s): WBC, NEUTROABS, HGB, HCT, MCV, PLT in the last 72 hours. Basic Metabolic Panel Recent Labs    33/82/50 0509  NA 141  K 3.6  CL 107  CO2 23  GLUCOSE 153*  BUN 16  CREATININE 0.83  CALCIUM 10.2   Liver Function Tests No results for input(s): AST, ALT, ALKPHOS, BILITOT, PROT, ALBUMIN in the last 72 hours. No results for input(s): LIPASE, AMYLASE in the last 72 hours. Cardiac Enzymes No results for input(s): CKTOTAL, CKMB, CKMBINDEX, TROPONINI in the last 72 hours.  BNP: BNP (last 3 results) No results for input(s): BNP in the last 8760 hours.  ProBNP (last 3 results) No results for input(s): PROBNP in the last 8760 hours.   D-Dimer No results for input(s): DDIMER in the last 72 hours. Hemoglobin A1C No results for input(s): HGBA1C in the last 72 hours. Fasting Lipid Panel No results for input(s): CHOL, HDL, LDLCALC, TRIG, CHOLHDL, LDLDIRECT in the last 72 hours. Thyroid Function Tests No results for input(s): TSH, T4TOTAL, T3FREE, THYROIDAB in the last 72 hours.  Invalid input(s): FREET3  Other results:   Imaging    No  results found.   Medications:     Scheduled Medications: . atorvastatin  80 mg Oral q1800  . carvedilol  6.25 mg Oral BID WC  . clopidogrel  75 mg Oral Q breakfast  . darifenacin  15 mg Oral QHS  . diclofenac Sodium  2 g Topical QID  . docusate sodium  100 mg Oral BID  . feeding supplement  1 Container Oral BID BM  . feeding supplement (PRO-STAT SUGAR FREE 64)  30 mL Oral BID  . insulin aspart  0-15 Units Subcutaneous TID WC  . losartan  75 mg Oral Daily  . magnesium gluconate  500 mg Oral TID  . melatonin  1.5 mg Oral  QHS  . metFORMIN  500 mg Oral Q breakfast  . multivitamin with minerals  1 tablet Oral Daily  . pantoprazole  40 mg Oral BID  . tamsulosin  0.4 mg Oral QPC supper    Infusions:   PRN Medications: acetaminophen **OR** acetaminophen, nitroGLYCERIN, polyethylene glycol, polyvinyl alcohol, Resource ThickenUp Clear, senna-docusate     Assessment/Plan   1. CAD with NSTEMI - cath with severe diffuse 3v CAD particularly on left side with severe LV dysfunction. Ideally will need CABG, however in looking at her films worry about the quality of her LAD target and also not sure she would benefit much from a graft to the RCA. Concerned about her mobility especially after recent fall an groin hematoma and low albumin - s/p PCI of pLAD w/ DES x 1 + PCI of mLCX w/ DES x 1 - CMRI- LVEF 29% RV 37% Global hypokinesis.  - 2D echo 10/02/19 showed improved EF, up to 50-55% - No chest pain.  - continue Plavix and statin. Hold aspirin with GI bleed.   2. Acute systolic HF due to NICM - Initial EF ~25% - repeat echo 3/31, EF normalized, 50-55% -Per neurology allow SBP 130-150s.  - Continue low dose carvedilol  6.25 mg bid - Increase losartan to 100 mg daily.  - BMET in am  3. R groin hematoma after fall - has hematoma at cath site.  - u/s negative for PSA - CT + hematoma. No mention of fracture but not completely imaged. - Resolved.   4. Hypokalemia/Hypomag - K 4.0 on last BMP 4/28 - Mg pending   5. Severe protein calorie malnutrition  - albumin 2.5 - getting scheduled BOOST supplements BID    6 . Stroke, Acute  -CT 3/28 No acute findings  -CT 3/29 at 0500 R basal infact, small r pontine infact.  -CT 3/30 - Acute infarct centered in the right parietal lobe correlating with perfusion defects. Age-indeterminate infarct at the right caudate that is stable from yesterday. - -PT/OT/Speech on CIR  - LUE improving with better motor strength   7. NSVT On low dose carvedilol   8.  Anemia, GI bleed.  H/O gastric ulcers years ago per son.  - Urgent EGD 4/6 showed an actively bleeding duodenal bulb ulcer with visible vessel, treated with epi and hemostasis clips x 5. Hemostasis achieved  -  Continue protonix 40 mg twice a day.  - ASA discontinued. Remains on Plavix for recently placed coronary DES  9. Agitation/ Hallucinations - Resolved.   Length of Stay: [place], NP  11/12/2019, 10:13 AM  Advanced Heart Failure Team Pager [phone] (M-F; [place])  Please contact [facility] for night-coverage after hours (4p -7a ) and weekends on [email or web address]   Patient seen and examined with the  above-signed Engineer, mining. I personally reviewed laboratory data, imaging studies and relevant notes. I independently examined the patient and formulated the important aspects of the plan. I have edited the note to reflect any of my changes or salient points. I have personally discussed the plan with the patient and/or family.  Much improved. Walking with walker. Speech improved. Much more independent. RUE remains weak. BP high. Labs ok. No CP or SOB.   Agree with increasing losartan.  Cardiac meds for d/c  Losartan 100 daily Atorva 80 Carvedilol 6.25 bid  Plavix 75 daily   Will arrange follow up.   Arvilla Meres, MD  2:31 PM

## 2019-11-12 NOTE — Discharge Instructions (Signed)
Inpatient Rehab Discharge Instructions  Brittney Kennedy Discharge date and time: No discharge date for patient encounter.   Activities/Precautions/ Functional Status: Activity: activity as tolerated Diet:  Wound Care: none needed Functional status:  ___ No restrictions     ___ Walk up steps independently ___ 24/7 supervision/assistance   ___ Walk up steps with assistance ___ Intermittent supervision/assistance  ___ Bathe/dress independently ___ Walk with walker     _x__ Bathe/dress with assistance ___ Walk Independently    ___ Shower independently ___ Walk with assistance    ___ Shower with assistance ___ No alcohol     ___ Return to work/school ________ COMMUNITY REFERRALS UPON DISCHARGE:    Home Health:   PT    OT     ST                   Agency: [facility]  Phone: [phone]    Medical Equipment/Items Ordered: Wheelchair, Conservation officer, nature, Beside Commode, Hospital Bed                                                 Agency/Supplier: [employer] [phone]  Special Instructions: No driving smoking or alcohol STROKE/TIA DISCHARGE INSTRUCTIONS SMOKING Cigarette smoking nearly doubles your risk of having a stroke & is the single most alterable risk factor  If you smoke or have smoked in the last 12 months, you are advised to quit smoking for your health.  Most of the excess cardiovascular risk related to smoking disappears within a year of stopping.  Ask you doctor about anti-smoking medications   Quit Line: 1-800-QUIT NOW  Free Smoking Cessation Classes [phone]  CHOLESTEROL Know your levels; limit fat & cholesterol in your diet  Lipid Panel     Component Value Date/Time   CHOL 99 10/01/2019 0306   TRIG 174 (H) 10/01/2019 0306   HDL 27 (L) 10/01/2019 0306   CHOLHDL 3.7 10/01/2019 0306   VLDL 35 10/01/2019 0306   LDLCALC 37 10/01/2019 0306      Many patients benefit from treatment even if their cholesterol is at goal.  Goal: Total Cholesterol (CHOL) less than  160  Goal:  Triglycerides (TRIG) less than 150  Goal:  HDL greater than 40  Goal:  LDL (LDLCALC) less than 100   BLOOD PRESSURE American Stroke Association blood pressure target is less that 120/80 mm/Hg  Your discharge blood pressure is:  BP: 131/71  Monitor your blood pressure  Limit your salt and alcohol intake  Many individuals will require more than one medication for high blood pressure  DIABETES (A1c is a blood sugar average for last 3 months) Goal HGBA1c is under 7% (HBGA1c is blood sugar average for last 3 months)  Diabetes:    Lab Results  Component Value Date   HGBA1C 5.9 (H) 09/26/2019     Your HGBA1c can be lowered with medications, healthy diet, and exercise.  Check your blood sugar as directed by your physician  Call your physician if you experience unexplained or low blood sugars.  PHYSICAL ACTIVITY/REHABILITATION Goal is 30 minutes at least 4 days per week  Activity: Increase activity slowly, Therapies: Physical Therapy: Home Health Return to work:   Activity decreases your risk of heart attack and stroke and makes your heart stronger.  It helps control your weight and blood pressure; helps you relax and can improve your  mood.  Participate in a regular exercise program.  Talk with your doctor about the best form of exercise for you (dancing, walking, swimming, cycling).  DIET/WEIGHT Goal is to maintain a healthy weight  Your discharge diet is:  Diet Order            DIET - DYS 1 Room service appropriate? Yes; Fluid consistency: Nectar Thick  Diet effective now              liquids Your height is:  Height: 5\' 2"  (157.5 cm) Your current weight is: Weight: 70.4 kg Your Body Mass Index (BMI) is:  BMI (Calculated): 28.38  Following the type of diet specifically designed for you will help prevent another stroke.  Your goal weight range is:    Your goal Body Mass Index (BMI) is 19-24.  Healthy food habits can help reduce 3 risk factors for stroke:   High cholesterol, hypertension, and excess weight.  RESOURCES Stroke/Support Group:  Call [phone]   STROKE EDUCATION PROVIDED/REVIEWED AND GIVEN TO PATIENT Stroke warning signs and symptoms How to activate emergency medical system (call 911). Medications prescribed at discharge. Need for follow-up after discharge. Personal risk factors for stroke. Pneumonia vaccine given:  Flu vaccine given:  My questions have been answered, the writing is legible, and I understand these instructions.  I will adhere to these goals & educational materials that have been provided to me after my discharge from the hospital.      My questions have been answered and I understand these instructions. I will adhere to these goals and the provided educational materials after my discharge from the hospital.  Patient/Caregiver Signature _______________________________ Date __________  Clinician Signature _______________________________________ Date __________  Please bring this form and your medication list with you to all your follow-up doctor's appointments.

## 2019-11-12 NOTE — Progress Notes (Signed)
Physical Therapy Session Note  Patient Details  Name: Brittney Kennedy MRN: [id number] Date of Birth: [birth year]  Today's Date: 11/12/2019 PT Individual Time: 1000-1045 PT Individual Time Calculation (min): 45 min   Short Term Goals: Week 4:  PT Short Term Goal 1 (Week 4): STGs = LTGs due to ELOS  Skilled Therapeutic Interventions/Progress Updates:    patient received sitting at EOB, family present and ready for education today. Spent session working on family education with daughter in Child psychotherapist guarding/walking/transferring with patient today with cues from therapist for improved safety and efficiency; have worked out system where [place] taps patient on the left side to indicate that patient needs to pay greater attention to that side. Patient was able to transfer from bed, walk approximately 32f in room, and toilet/change pants/brief while on commode with SSunday Spillershelping her and PT supervision/providing feedback and verbal cues. Overall family did a good job with guarding and managing patient today. All questions answered to family/patient satisfaction. Will plan to sign SSunday Spillersoff to help patient to the bathroom, also still planning for further family ed tomorrow. Patient left up in chair with all needs met this morning awaiting next therapist.   Therapy Documentation Precautions:  Precautions Precautions: Fall, Other (comment) Precaution Comments: aspiration precautions (HOB >30 degrees), significant L inattention/neglect, signifcant L hemiparesis (UE>LE with L shoulder subluxation) Restrictions Weight Bearing Restrictions: No   Pain: Pain Assessment Pain Scale: Faces Pain Score: 0-No pain Faces Pain Scale: No hurt    Therapy/Group: Individual Therapy   KWindell Norfolk DPT, PN1   Supplemental Physical Therapist CSelma   Pager [phone] Rehab Office [phone]   11/12/2019, 12:32 PM

## 2019-11-12 NOTE — Progress Notes (Signed)
Occupational Therapy Discharge Summary  Patient Details  Name: Brittney Kennedy MRN: [id number] Date of Birth: May 26, 1951     Patient has met 34 of 15 long term goals due to improved activity tolerance, improved balance, postural control, ability to compensate for deficits, functional use of  LEFT upper and LEFT lower extremity, improved attention, improved awareness and improved coordination.  Patient to discharge at [facility] Assist level.  Patient's care partner is independent to provide the necessary physical and cognitive assistance at discharge.    Reasons goals not met: Pt continues to need cues to scan to her L side.  Recommendation:  Patient will benefit from ongoing skilled OT services in home health setting to continue to advance functional skills in the area of BADL.  Equipment: No equipment provided - family already has needed DME  Reasons for discharge: treatment goals met  Patient/family agrees with progress made and goals achieved: Yes  OT Discharge ADL ADL Grooming: Supervision/safety Where Assessed-Grooming: Sitting at sink Upper Body Bathing: Supervision/safety Where Assessed-Upper Body Bathing: Shower Lower Body Bathing: Contact guard Where Assessed-Lower Body Bathing: Shower Upper Body Dressing: Supervision/safety Where Assessed-Upper Body Dressing: Wheelchair Lower Body Dressing: Minimal assistance Where Assessed-Lower Body Dressing: Wheelchair Toileting: Contact guard Where Assessed-Toileting: Glass blower/designer: Therapist, music Method: Counselling psychologist: Grab bars, Raised toilet seat Social research officer, government: Curator Method: Heritage manager: Civil engineer, contracting with back, Grab bars Vision Baseline Vision/History: Wears glasses Wears Glasses: At all times Patient Visual Report: Blurring of vision Ocular Range of Motion: Restricted on the left Alignment/Gaze Preference:  Gaze right Tracking/Visual Pursuits: Left eye does not track laterally;Unable to hold eye position out of midline;Decreased smoothness of horizontal tracking Visual Fields: Left visual field deficit Additional Comments: Pt has improved with her ability to track to her L but needs cuing and compensatory techniques. Perception  Inattention/Neglect: Does not attend to left visual field(improved body awareness) Praxis Praxis: Intact Cognition Overall Cognitive Status: Impaired/Different from baseline Arousal/Alertness: Awake/alert Orientation Level: Oriented X4 Attention: Sustained Focused Attention: Impaired Focused Attention Impairment: Verbal basic;Functional basic Sustained Attention: Impaired Sustained Attention Impairment: Verbal basic;Functional basic Memory: Impaired Memory Impairment: Retrieval deficit;Decreased short term memory Decreased Short Term Memory: Verbal basic;Functional basic Awareness: Impaired Awareness Impairment: Emergent impairment Problem Solving: Impaired Problem Solving Impairment: Verbal basic;Functional basic Executive Function: Sequencing;Organizing;Self Correcting Sequencing: Impaired Sequencing Impairment: Verbal basic;Functional basic Organizing: Impaired Organizing Impairment: Verbal basic;Functional basic Self Correcting: Impaired Self Correcting Impairment: Functional basic Safety/Judgment: Impaired Sensation Sensation Light Touch Impaired Details: Impaired LLE Hot/Cold: Appears Intact Proprioception Impaired Details: Impaired LUE Stereognosis: Impaired by gross assessment Coordination Gross Motor Movements are Fluid and Coordinated: No Fine Motor Movements are Fluid and Coordinated: No Coordination and Movement Description: pt is now using L hand as an active A but can not lift the arm past 40 degrees Finger Nose Finger Test: unable to complete with L hand Motor  Motor Motor: Hemiplegia;Abnormal tone;Other (comment);Abnormal postural  alignment and control Motor - Discharge Observations: improved tone in LUE, some hypertone in shoulder and wrist Mobility    CGA to complete bathroom transfers with RW Trunk/Postural Assessment  Postural Control Postural Limitations: decreased, needs CGA support in standing due to decreased LLE weakness. sitting postural control WFL  Balance Static Sitting Balance Static Sitting - Level of Assistance: 7: Independent Dynamic Sitting Balance Dynamic Sitting - Level of Assistance: 5: Stand by assistance Static Standing Balance Static Standing - Level of Assistance: 4: Min assist;5: Stand by assistance(CGA)  Dynamic Standing Balance Dynamic Standing - Level of Assistance: 4: Min assist Extremity/Trunk Assessment RUE Assessment RUE Assessment: Within Functional Limits LUE Assessment Brunstrum level for arm: Stage IV Movement is deviating from synergy Brunstrum level for hand: Stage IV Movements deviating from synergies LUE AROM (degrees) Left Shoulder Flexion: 40 Degrees Left Shoulder ABduction: 40 Degrees Left Elbow Flexion: 90 Left Wrist Extension: 20 Degrees Left Composite Finger Extension: (100%) Left Composite Finger Flexion: (100%) LUE Tone Hypotonic Details: shoulder subluxation, hypertonicity in shoulder pulling humeral head forward   [place] 11/12/2019, 3:52 PM

## 2019-11-12 NOTE — Progress Notes (Signed)
Physical Therapy Session Note  Patient Details  Name: Brittney Kennedy MRN: [id number] Date of Birth: 28-Oct-1950  Today's Date: 11/12/2019 PT Individual Time: 8569-4370 PT Individual Time Calculation (min): 72 min   Short Term Goals: Week 4:  PT Short Term Goal 1 (Week 4): STGs = LTGs due to ELOS  Skilled Therapeutic Interventions/Progress Updates:    Patient received up in [facility], pleasant and willing to participate in session today. Spent quite a bit of time focusing on gait today, including working through short obstacle course involving stepping over hockey stick and stepping up onto/down off of 2 inch box with RW, gait training approximately 16f x2 and limited by hip pain and fatigue, and progressed stair training- actually able to navigate eight four-inch steps with B rails and MinA for management of L side/balance today! Otherwise worked on functional tasks such as car transfers and further practiced safety and consistency with functional transfers today. In general, able to gait train with min guard, perform functional transfers with S-min guard, and perform steps with MinA. Continues to need cues for improved attention to L side and attention in general- very easily distracted and tangential and with very poor safety awareness when distracted. [facility], safety seatbelt active this afternoon.   Therapy Documentation Precautions:  Precautions Precautions: Fall, Other (comment) Precaution Comments: aspiration precautions (HOB >30 degrees), significant L inattention/neglect, signifcant L hemiparesis (UE>LE with L shoulder subluxation) Restrictions Weight Bearing Restrictions: No Pain: Pain Assessment Pain Scale: Faces Pain Score: 0-No pain Faces Pain Scale: No hurt    Therapy/Group: Individual Therapy   KWindell Norfolk DPT, PN1   Supplemental Physical Therapist [place]   Pager [phone] Rehab Office [phone]   11/12/2019, 3:44 PM

## 2019-11-12 NOTE — Progress Notes (Signed)
Speech Language Pathology Daily Session Note  Patient Details  Name: Brittney Kennedy MRN: [id number] Date of Birth: [birth year]  Today's Date: 11/12/2019 SLP Individual Time: 9166-0600 SLP Individual Time Calculation (min): 29 min  Short Term Goals: Week 4: SLP Short Term Goal 1 (Week 4): STG=LTG due to remaining LOS  Skilled Therapeutic Interventions: Pt was seen for skilled ST targeting swallow goals and continuance of family education with pt's son and son's sister in law. Interpretor present to assist with Spanish translation for family member. Family educated via verbal explanation and written handout regarding upgraded diet texture (dysphagia 3 - mechanical soft), as last time they were here for education pt was on dysphagia 2 diet. Answered several questions regarding specific food items in and out of compliance with dysphagia 3 textures. SLP also provided Min A verbal cues for use of left head turn and to reduce verbosity while pt was consuming ice chips (her preference). Pt exhibited 1 immediate cough throughout intake. Pt left sitting in chair with alarm set and needs within reach, family and interpretor still present. Continue per current plan of care.       Pain Pain Assessment Pain Scale: 0-10 Pain Score: 0-No pain  Therapy/Group: Individual Therapy  Brittney Kennedy 11/12/2019, 7:17 AM

## 2019-11-12 NOTE — Progress Notes (Signed)
[place] PHYSICAL MEDICINE & REHABILITATION PROGRESS NOTE  Subjective/Complaints: Patient seen laying in bed this AM.  She states she slept well overnight, but wants to sleep more.   ROS: Denies CP, SOB, N/V/D  Objective: Vital Signs: Blood pressure (!) 147/67, pulse 84, temperature 98.1 F (36.7 C), temperature source Oral, resp. rate 18, height 5\' 2"  (1.575 m), weight 67.6 kg, SpO2 96 %. No results found. No results for input(s): WBC, HGB, HCT, PLT in the last 72 hours. Recent Labs    11/12/19 0509  NA 141  K 3.6  CL 107  CO2 23  GLUCOSE 153*  BUN 16  CREATININE 0.83  CALCIUM 10.2    Physical Exam: BP (!) 147/67 (BP Location: Right Arm)   Pulse 84   Temp 98.1 F (36.7 C) (Oral)   Resp 18   Ht 5\' 2"  (1.575 m)   Wt 67.6 kg   SpO2 96%   BMI 27.26 kg/m   Constitutional: No distress . Vital signs reviewed. HENT: Normocephalic.  Atraumatic. Eyes: EOMI. No discharge. Cardiovascular: No JVD. Respiratory: Normal effort.  No stridor. GI: Non-distended. Skin: Warm and dry.  Intact. Psych: Flat. Musc: No edema in extremities.  No tenderness in extremities. Neuro: Alert Motor:  LUE: Shoulder abduction 3/5, distally 4/5, improving LLE: 4/5 proximal to distal (?  Some pain inhibition), unchanged  Assessment/Plan: 1. Functional deficits secondary to bilateral embolic infarcts.  Which require 3+ hours per day of interdisciplinary therapy in a comprehensive inpatient rehab setting.  Physiatrist is providing close team supervision and 24 hour management of active medical problems listed below.  Physiatrist and rehab team continue to assess barriers to discharge/monitor patient progress toward functional and medical goals  Care Tool:  Bathing    Body parts bathed by patient: Right arm, Left arm, Chest, Abdomen, Front perineal area, Buttocks, Right upper leg, Left upper leg, Right lower leg, Left lower leg, Face   Body parts bathed by helper: Right arm, Buttocks Body  parts n/a: Right arm, Left arm   Bathing assist Assist Level: Contact Guard/Touching assist     Upper Body Dressing/Undressing Upper body dressing   What is the patient wearing?: Pull over shirt    Upper body assist Assist Level: Minimal Assistance - Patient > 75%    Lower Body Dressing/Undressing Lower body dressing      What is the patient wearing?: Pants, Incontinence brief     Lower body assist Assist for lower body dressing: Minimal Assistance - Patient > 75%     Toileting Toileting    Toileting assist Assist for toileting: Contact Guard/Touching assist     Transfers Chair/bed transfer  Transfers assist     Chair/bed transfer assist level: Contact Guard/Touching assist     Locomotion Ambulation   Ambulation assist      Assist level: Contact Guard/Touching assist Assistive device: Walker-rolling Max distance: 156ft   Walk 10 feet activity   Assist     Assist level: Contact Guard/Touching assist Assistive device: Walker-rolling   Walk 50 feet activity   Assist Walk 50 feet with 2 turns activity did not occur: Safety/medical concerns  Assist level: Contact Guard/Touching assist Assistive device: Walker-rolling    Walk 150 feet activity   Assist Walk 150 feet activity did not occur: Safety/medical concerns         Walk 10 feet on uneven surface  activity   Assist Walk 10 feet on uneven surfaces activity did not occur: Safety/medical concerns  Wheelchair     Assist Will patient use wheelchair at discharge?: (TBD)             Wheelchair 50 feet with 2 turns activity    Assist            Wheelchair 150 feet activity     Assist            Medical Problem List and Plan: 1.  Altered mental status left hemiparesis secondary to right MCA infarction along with a small left CR and splenium infarct embolic pattern likely due to transient cardiomyopathy with low EF after cardiac catheterization for  non-STEMI with stenting complicated by groin hematoma  Continue CIR  Repeat CT unremarkable for changes 2.  Antithrombotics: -DVT/anticoagulation: SCDs             -antiplatelet therapy: Plavix 3. Pain Management: Tylenol as needed  Voltaren gel to bilateral knees ordered on 4/27, with improvement  Voltaren gel added to left shoulder (history of rotator cuff repain), with improvement  Controlled with meds on 5/11 4. Mood: Amantadine 100 mg twice daily, DC'd secondary to hallucinations- resolved  Melatonin 3 mg nightly             -antipsychotic agents: agitation resolved d/c haldol 5. Neuropsych: This patient is not capable of making decisions on her own behalf. 6. Skin/Wound Care: Routine skin checks 7. Fluids/Electrolytes/Nutrition: Routine in and outs.   BMP within acceptable range, except for glucose on 5/11 8.  GI bleed/duodenal ulcer.  Status post EGD 10/08/2019 with epi and hemostasis clips x5.  PPI twice daily             See #9  Per GI, continue Protonix BID dosing for now 9.  Acute blood loss anemia.    Hemoglobin 9.8 on 5/7  Continue to monitor 10.  AKI.  Resolved.  11.  Acute urinary tract infection.  Patient completed 5-day course amoxicillin 10/17/2019 12.  Post stroke dysphagia.  Dysphagia #3 thins liquids.  Follow-up speech therapy             Advance diet as tolerated 13.  Keratoconjunctivitis.  Follow-up ophthalmology services.  Completed 7-day course of erythromycin drops on 4/17. 14.  Hypertension.     Increased Coreg to 6.25 mg BID with meals  Discussed with pharmacy, started on Losartan 12.5 on 4/22, increased to 25 mg, increased to 50 on 4/28, increased to 75 on 5/1 with eventual plans for Entresto  Controlled on 5/11 15.  Hyperlipidemia.  Lipitor 16.  Prediabetes.  Hemoglobin A1c 5.9.  SSI  Metformin 500 daily started on 5/4 CBG (last 3)  Recent Labs    11/11/19 0647 11/11/19 1132 11/11/19 1639  GLUCAP 156* 128* 140*   Elevated on 5/11, ?improving  Carb  modified diet ordered 17. Acute systolic CHF   Filed Weights   11/08/19 0358 11/11/19 0309 11/12/19 0320  Weight: 68 kg 67.6 kg 67.6 kg   Stable on 5/11 18. Hypomagnesemia  Mag 1.5 on 5/7  Supplemented IV on 4/19, again on 4/23, again on 4/27, again on 5/5  Oral supplementation initiated, increased on 4/30, increased again on 5/5  Discussed with pharmacy potential offending agents  See #8  Labs ordered for tommorrow 19.  Hypokalemia  Potassium 3.6 on 5/11  20. Mod/Severe Hypoalbuminemia  Supplement initiated 21.  Urinary frequency/Neurogenic bladder  PVRs ordered, only performed x1 with minimal residual  UA unremarkable, urine culture minimal growth, repeat UA relatively unremarkable   Keflex course completed  on 4/29  Enablex started on 4/30, increased on 5/2  Flomax started on 5/5  Improving overall on 5/7  Melatonin started on 5/3 due to psychological component  22. Left shoulder OA  See #3, seems improved  History of rotator cuff repair  Xray personally reviewed and with patient, OA.  Calcific tendinosis.   Will consider steroid injection if pain recurs  Relatively controlled on 5/11  LOS: 27 days A FACE TO FACE EVALUATION WAS PERFORMED  Cameryn Chrisley Karis Juba 11/12/2019, 10:01 AM

## 2019-11-13 ENCOUNTER — Encounter (HOSPITAL_COMMUNITY): Payer: PRIVATE HEALTH INSURANCE

## 2019-11-13 ENCOUNTER — Inpatient Hospital Stay (HOSPITAL_COMMUNITY): Payer: PRIVATE HEALTH INSURANCE | Admitting: Speech Pathology

## 2019-11-13 ENCOUNTER — Ambulatory Visit (HOSPITAL_COMMUNITY): Payer: PRIVATE HEALTH INSURANCE | Admitting: Physical Therapy

## 2019-11-13 LAB — GLUCOSE, CAPILLARY
Glucose-Capillary: 141 mg/dL — ABNORMAL HIGH (ref 70–99)
Glucose-Capillary: 159 mg/dL — ABNORMAL HIGH (ref 70–99)
Glucose-Capillary: 117 mg/dL — ABNORMAL HIGH (ref 70–99)
Glucose-Capillary: 127 mg/dL — ABNORMAL HIGH (ref 70–99)

## 2019-11-13 LAB — MAGNESIUM: Magnesium: 1.2 mg/dL — ABNORMAL LOW (ref 1.7–2.4)

## 2019-11-13 MED ORDER — DARIFENACIN HYDROBROMIDE ER 15 MG PO TB24: 15.0000 mg | ORAL_TABLET | Freq: Every day | ORAL | 0 refills | Status: DC

## 2019-11-13 MED ORDER — LOSARTAN POTASSIUM 100 MG PO TABS: 100.0000 mg | ORAL_TABLET | Freq: Every day | ORAL | 0 refills | Status: DC

## 2019-11-13 MED ORDER — PANTOPRAZOLE SODIUM 40 MG PO TBEC
40.0000 mg | DELAYED_RELEASE_TABLET | Freq: Two times a day (BID) | ORAL | 11 refills | Status: DC
Start: 2019-11-13 — End: 2020-01-22

## 2019-11-13 MED ORDER — CARVEDILOL 6.25 MG PO TABS: 6.2500 mg | ORAL_TABLET | Freq: Two times a day (BID) | ORAL | 0 refills | Status: DC

## 2019-11-13 MED ORDER — ACETAMINOPHEN 325 MG PO TABS: 650.0000 mg | ORAL_TABLET | ORAL | Status: AC | PRN

## 2019-11-13 MED ORDER — METFORMIN HCL 500 MG PO TABS: 500.0000 mg | ORAL_TABLET | Freq: Every day | ORAL | 0 refills | Status: DC

## 2019-11-13 MED ORDER — MAGNESIUM SULFATE 4 GM/100ML IV SOLN
4.0000 g | Freq: Every day | INTRAVENOUS | Status: DC
  Administered 2019-11-13 – 2019-11-14 (×2): 4 g via INTRAVENOUS
  Filled 2019-11-13 (×2): qty 100

## 2019-11-13 MED ORDER — CLOPIDOGREL BISULFATE 75 MG PO TABS: 75.0000 mg | ORAL_TABLET | Freq: Every day | ORAL | 0 refills | Status: DC

## 2019-11-13 MED ORDER — DICLOFENAC SODIUM 1 % EX GEL: 2.0000 g | Freq: Four times a day (QID) | CUTANEOUS | 0 refills | Status: DC

## 2019-11-13 MED ORDER — MAGNESIUM GLUCONATE 500 MG PO TABS
500.0000 mg | ORAL_TABLET | Freq: Four times a day (QID) | ORAL | Status: DC
  Administered 2019-11-13 – 2019-11-14 (×4): 500 mg via ORAL
  Filled 2019-11-13 (×6): qty 1

## 2019-11-13 MED ORDER — ATORVASTATIN CALCIUM 80 MG PO TABS: 80.0000 mg | ORAL_TABLET | Freq: Every day | ORAL | 0 refills | Status: DC

## 2019-11-13 MED ORDER — TAMSULOSIN HCL 0.4 MG PO CAPS: 0.4000 mg | ORAL_CAPSULE | Freq: Every day | ORAL | 0 refills | Status: DC

## 2019-11-13 MED ORDER — NITROGLYCERIN 0.4 MG SL SUBL: 0.4000 mg | SUBLINGUAL_TABLET | SUBLINGUAL | 12 refills | Status: AC | PRN

## 2019-11-13 NOTE — Progress Notes (Signed)
Nutrition Follow-up  DOCUMENTATION CODES:   Not applicable  INTERVENTION:   -ContinueBoost Breeze poBID, each supplement provides 250 kcal and 9 grams of protein  -ContinuePro-stat 30 ml po BID, each supplement provides 100 kcal and 15 grams of protein  - MVI with minerals daily  - ContinueVital Cuisine ShakeTID, each supplement provides 520 kcal and 22 grams of protein(vanilla flavor)  - Provided education regarding the importance of adequate kcal and protein after discharge  NUTRITION DIAGNOSIS:   Increased nutrient needs related to other (therapies) as evidenced by estimated needs.  Ongoing, being addressed via supplements  GOAL:   Patient will meet greater than or equal to 90% of their needs  Progressing  MONITOR:   PO intake, Supplement acceptance, Weight trends, Labs, Diet advancement  REASON FOR ASSESSMENT:   Malnutrition Screening Tool    ASSESSMENT:   69 yo female admitted with NSTEMI, acute CHF and later had a change in mental status and found to have acute stroke. PMH includes PUD, seasonal allergies, cholecystectomy and left rotator cuff repair.  3/26- cardiaccath, new onset CHF 3/28- rapid response due to lethargy, slurred speech, CT head no acute findings 3/29-CVA: R basal infarct, smallRpontine infarct 3/30-CT acute infact centered inR parietal lobe 4/03-MBS,dysphagia 1,nectar-thick liquids 4/06- rapidresponse due to hypotension and dark tarry liquid stools; s/p EGD 4/14-admit to CIR 4/27 - MBS with recommendations for Dysphagia 2 diet with thin liquids 5/06 - diet advanced to dysphagia 3 with thin liquids  Noted plan for discharge tomorrow.  Spoke with pt and family members at bedside. Pt reports improved appetite and states that she is drinking Boost Breeze and [employer] "some of the time." Pt's son reports that he has purchased some [employer] supplements for pt to consume at home. Pt states that she gets full  easily. Provided education on ways to maximize kcal and protein intake after discharge.  Admission weight: 70.4 kg Current weight: 66.5 kg  Meal Completion: 50-75% x last 8 meals  Medications reviewed and include: colace, Boost Breeze BID, Pro-stat BID, SSI, magnesium gluconate, Metformin, MVI with minerals, protonix, IV magnesium sulfate 4 grams daily  Labs reviewed: magnesium 1.2 CBG's: 133-171 x 24 hours  Diet Order:   Diet Order            DIET DYS 3 Room service appropriate? Yes; Fluid consistency: Thin  Diet effective 1000              EDUCATION NEEDS:   Not appropriate for education at this time  Skin:  Skin Assessment: Reviewed RN Assessment  Last BM:  11/12/19  Height:   Ht Readings from Last 1 Encounters:  10/16/19 5\' 2"  (1.575 m)    Weight:   Wt Readings from Last 1 Encounters:  11/13/19 66.5 kg    BMI:  Body mass index is 26.81 kg/m.  Estimated Nutritional Needs:   Kcal:  1500-1700  Protein:  75-85 grams  Fluid:  >/= 1.5 L    01/13/20, MS, RD, LDN Inpatient Clinical Dietitian Pager: [phone] Weekend/After Hours: [phone]

## 2019-11-13 NOTE — Patient Care Conference (Signed)
Inpatient RehabilitationTeam Conference and Plan of Care Update Date: 11/13/2019   Time: 4:12 PM    Patient Name: Brittney Kennedy      Medical Record Number: [id number]  Date of Birth: 1951-04-28 Sex: Female         Room/Bed: 4W12C/4W12C-01 Payor Info: Payor: MEDICARE / Plan: MEDICARE PART A AND B / Product Type: *No Product type* /    Admit Date/Time:  10/16/2019  4:10 PM  Primary Diagnosis:  Embolic infarction [facility])  Patient Active Problem List   Diagnosis Date Noted  . Osteoarthritis of left shoulder   . Chronic left shoulder pain   . Neurogenic bladder   . Acute lower UTI   . Urinary frequency   . Hypoalbuminemia due to protein-calorie malnutrition ([place])   . Hypokalemia   . Hypomagnesemia   . Labile blood pressure   . Labile blood glucose   . Acute systolic congestive heart failure ([place])   . Essential hypertension   . Keratoconjunctivitis   . Right middle cerebral artery stroke ([place]) 10/16/2019  . Embolic infarction ([place]) 10/16/2019  . Prediabetes   . Benign essential HTN   . Dysphagia, post-stroke   . Duodenal ulcer   . Acute blood loss anemia 10/08/2019  . Upper GI bleed   . Status post coronary artery stent placement   . Malnutrition of moderate degree 10/02/2019  . Cerebral embolism with cerebral infarction 10/01/2019  . CAD (coronary artery disease) 09/27/2019  . Acute systolic CHF (congestive heart failure) ([place]) 09/27/2019  . NSTEMI (non-ST elevated myocardial infarction) ([place]) 09/26/2019    Expected Discharge Date: Expected Discharge Date: 11/14/19  Team Members Present: Physician leading conference: Dr. Delice Lesch Care Coodinator Present: Nestor Lewandowsky, RN, BSN, CRRN;Christina Sampson Goon, [place] Nurse Present: Rayne Du, LPN PT Present: Deniece Ree, PT OT Present: Willeen Cass, OT SLP Present: Jettie Booze, CF-SLP PPS Coordinator present : Ileana Ladd, Burna Mortimer, SLP     Current Status/Progress Goal Weekly Team Focus  Bowel/Bladder    Patient remains continent of bowel and bladder with occasional episodes of urgency (adult briefs)  Patient will maintain continent with regular patterns  Assess Qshift and PRN.  Q2-3H toileting.   Swallow/Nutrition/ Hydration   Dysphagia 3, thin liquids left head turn, Min A left head turn without distractions  Min A  tolerance current diet, education   ADL's   Pt is currently at goal level for self care and making excellent progress with her balance, LUE function and will be ready for discharge on thursday. Pt needs continued work with her L side awareness and LUE functional use.  min A overall with bathing, LB dressing, toileting; supervision with UB dressing, grooming; min A with transfers, standing balance  ADL training, LUE NMR, balance, L side awareness, cognition, pt/family eduction   Mobility   S-MinA bed mobility (sometimes ModA if really tired or hurting), S-min guard for transfers, min guard for gait, MinA for 8 steps with B rails. On track to meet goals.  S-CGA overall  progressing strength/balance/stair training, NMR, functional mobility tasks, L attention, family ed   Communication   Min A intelligibility 90% conversation  Min A  carryover speech strategies, education   Safety/Cognition/ Behavioral Observations  Min-Mod A  Min-Mod A  susatined attention, recall, problem solving, intellectual awareness, education   Pain   Patient reports occasional arm and shoulder pain relieved by PRN Tylenol, scheduled Diclofenac topical gel  Patient will maintain pain level less than <4 with or without activity  Assess pain  Qshift and PRN.  Facilitate Pharm and Non-pharm interventions   Skin   Patient's skin is intact  Patient will remain free of skin breakdown or infection  Assess Qshift and PRN.  Encourage strict peri care, skin care    Rehab Goals Patient on target to meet rehab goals: Yes Rehab Goals Revised: Patient on target with goals *See Care Plan and progress notes for long and  short-term goals.     Barriers to Discharge  Current Status/Progress Possible Resolutions Date Resolved   Nursing                  PT  Medical stability  on track to meet LTGs              OT                  SLP                SW   No barriers on target to meet goals          Discharge Planning/Teaching Needs:  Patient discharging 11/14/19  Education already provided   Team Discussion:  On target to discharge. Family education 11/13/19 and family concerned about magnesium level and supplements  Revisions to Treatment Plan:  Review with family awareness and attention issues, need for cues to maintain focus/safety and decrease distractions    Medical Summary Current Status: Altered mental status left hemiparesis secondary to right MCA infarction along with a small left CR and splenium infarct embolic pattern likely due to transient cardiomyopathy with low EF after cardiac catheterization for non-STEMI with stenting complicated by groin hematoma Weekly Focus/Goal: Improve mobility, BP, magnesium, prediabetes, HTN  Barriers to Discharge: Medical stability;Behavior   Possible Resolutions to Barriers: Therapies, hypomagnesemia, optimize prediabetes/HTN meds   Continued Need for Acute Rehabilitation Level of Care: The patient requires daily medical management by a physician with specialized training in physical medicine and rehabilitation for the following reasons: Direction of a multidisciplinary physical rehabilitation program to maximize functional independence : Yes Medical management of patient stability for increased activity during participation in an intensive rehabilitation regime.: Yes Analysis of laboratory values and/or radiology reports with any subsequent need for medication adjustment and/or medical intervention. : Yes   I attest that I was present, lead the team conference, and concur with the assessment and plan of the team.   Chana Bode B 11/13/2019, 4:12 PM

## 2019-11-13 NOTE — Progress Notes (Signed)
Occupational Therapy Session Note  Patient Details  Name: Brittney Kennedy MRN: [id number] Date of Birth: [birth year]  Today's Date: 11/13/2019 OT Individual Time: 1430-1510 OT Individual Time Calculation (min): 40 min   Skilled Therapeutic Interventions/Progress Updates:    Continued fam education with pt's son and caregiver. Practiced [facility] with cut up pieces of foam in a cup with elbow supported on table to aid with distal control. Pt performed task with extra time and rest breaks. Discussed other table top activities that would encourage [facility] in functional activities. Discussed ROM to promote function. Handout provided with activities they could work on. Also discussed safe kitchen tasks and lowering items needed for a more ideal reach. Also perform short distance functional ambulation with focus on turns and visual attention to the left. Pt left with family and caregiver up in the w/c.  Therapy Documentation Precautions:  Precautions Precautions: Fall, Other (comment) Precaution Comments: aspiration precautions (HOB >30 degrees), significant L inattention/neglect, signifcant L hemiparesis (UE>LE with L shoulder subluxation) Restrictions Weight Bearing Restrictions: No Pain: Pain Assessment Pain Scale: Faces Faces Pain Scale: No hurt   Therapy/Group: Individual Therapy  Roney Mans [facility] 11/13/2019, 4:00 PM

## 2019-11-13 NOTE — Progress Notes (Addendum)
PHYSICAL MEDICINE & REHABILITATION PROGRESS NOTE  Subjective/Complaints: Patient seen laying in bed this morning.  She states she slept well overnight.  She states she wants to sleep more.  She is aware of her discharge tomorrow.  She was seen by heart failure team yesterday, blood pressure medications adjusted, notes reviewed.  ROS: Denies CP, SOB, N/V/D  Objective: Vital Signs: Blood pressure (!) 113/43, pulse 75, temperature 98.2 F (36.8 C), resp. rate 18, height 5\' 2"  (1.575 m), weight 66.5 kg, SpO2 99 %. No results found. No results for input(s): WBC, HGB, HCT, PLT in the last 72 hours. Recent Labs    11/12/19 0509  NA 141  K 3.6  CL 107  CO2 23  GLUCOSE 153*  BUN 16  CREATININE 0.83  CALCIUM 10.2    Physical Exam: BP (!) 113/43 (BP Location: Left Arm)   Pulse 75   Temp 98.2 F (36.8 C)   Resp 18   Ht 5\' 2"  (1.575 m)   Wt 66.5 kg   SpO2 99%   BMI 26.81 kg/m   Constitutional: No distress . Vital signs reviewed. HENT: Normocephalic.  Atraumatic. Eyes: EOMI. No discharge. Cardiovascular: No JVD. Respiratory: Normal effort.  No stridor. GI: Non-distended. Skin: Warm and dry.  Intact. Psych: Flat. Musc: No edema in extremities.  No tenderness in extremities. Neuro: Alert Motor:  LUE: Shoulder abduction 3/5, distally 4/5, improving LLE: 4/5 proximal to distal (?  Some pain inhibition), stable  Assessment/Plan: 1. Functional deficits secondary to bilateral embolic infarcts.  Which require 3+ hours per day of interdisciplinary therapy in a comprehensive inpatient rehab setting.  Physiatrist is providing close team supervision and 24 hour management of active medical problems listed below.  Physiatrist and rehab team continue to assess barriers to discharge/monitor patient progress toward functional and medical goals  Care Tool:  Bathing    Body parts bathed by patient: Right arm, Left arm, Chest, Abdomen, Front perineal area, Buttocks, Right  upper leg, Left upper leg, Right lower leg, Left lower leg, Face   Body parts bathed by helper: Right arm, Buttocks Body parts n/a: Right arm, Left arm   Bathing assist Assist Level: Contact Guard/Touching assist     Upper Body Dressing/Undressing Upper body dressing   What is the patient wearing?: Pull over shirt    Upper body assist Assist Level: Minimal Assistance - Patient > 75%    Lower Body Dressing/Undressing Lower body dressing      What is the patient wearing?: Pants, Incontinence brief     Lower body assist Assist for lower body dressing: Minimal Assistance - Patient > 75%     Toileting Toileting    Toileting assist Assist for toileting: Contact Guard/Touching assist     Transfers Chair/bed transfer  Transfers assist     Chair/bed transfer assist level: Contact Guard/Touching assist     Locomotion Ambulation   Ambulation assist      Assist level: Contact Guard/Touching assist Assistive device: Walker-rolling Max distance: 61ft   Walk 10 feet activity   Assist     Assist level: Contact Guard/Touching assist Assistive device: Walker-rolling   Walk 50 feet activity   Assist Walk 50 feet with 2 turns activity did not occur: Safety/medical concerns  Assist level: Contact Guard/Touching assist Assistive device: Walker-rolling    Walk 150 feet activity   Assist Walk 150 feet activity did not occur: Safety/medical concerns         Walk 10 feet on uneven surface  activity   Assist Walk 10 feet on uneven surfaces activity did not occur: Safety/medical concerns         Wheelchair     Assist Will patient use wheelchair at discharge?: (TBD)             Wheelchair 50 feet with 2 turns activity    Assist            Wheelchair 150 feet activity     Assist            Medical Problem List and Plan: 1.  Altered mental status left hemiparesis secondary to right MCA infarction along with a small left CR  and splenium infarct embolic pattern likely due to transient cardiomyopathy with low EF after cardiac catheterization for non-STEMI with stenting complicated by groin hematoma  Continue CIR  Repeat CT unremarkable for changes  Team conference today to discuss current and goals and coordination of care, home and environmental barriers, and discharge planning with nursing, case manager, and therapies.    Discussed with son by telephone, updated on current status, follow-up management, magnesium supplementation 2.  Antithrombotics: -DVT/anticoagulation: SCDs             -antiplatelet therapy: Plavix 3. Pain Management: Tylenol as needed  Voltaren gel to bilateral knees ordered on 4/27, with improvement  Voltaren gel added to left shoulder (history of rotator cuff repain), with improvement  Controlled with meds on 5/12 4. Mood: Amantadine 100 mg twice daily, DC'd secondary to hallucinations- resolved  Melatonin 3 mg nightly             -antipsychotic agents: agitation resolved d/c haldol 5. Neuropsych: This patient is not capable of making decisions on her own behalf. 6. Skin/Wound Care: Routine skin checks 7. Fluids/Electrolytes/Nutrition: Routine in and outs.   BMP within acceptable range, except for glucose on 5/11 8.  GI bleed/duodenal ulcer.  Status post EGD 10/08/2019 with epi and hemostasis clips x5.  PPI twice daily             See #9  Per GI, continue Protonix BID dosing for now 9.  Acute blood loss anemia.    Hemoglobin 9.8 on 5/7  Continue to monitor 10.  AKI.  Resolved.  11.  Acute urinary tract infection.  Patient completed 5-day course amoxicillin 10/17/2019 12.  Post stroke dysphagia.  Dysphagia #3 thins liquids.  Follow-up speech therapy             Advance diet as tolerated 13.  Keratoconjunctivitis.  Follow-up ophthalmology services.  Completed 7-day course of erythromycin drops on 4/17. 14.  Hypertension.     Increased Coreg to 6.25 mg BID with meals  Discussed with  pharmacy, started on Losartan 12.5 on 4/22, increased to 25 mg, increased to 50 on 4/28, increased to 75 on 5/1, increased to 100 on 5/11  Controlled on 5/12 15.  Hyperlipidemia.  Lipitor 16.  Prediabetes.  Hemoglobin A1c 5.9.  SSI  Metformin 500 daily started on 5/4 CBG (last 3)  Recent Labs    11/12/19 1737 11/12/19 2114 11/13/19 0611  GLUCAP 133* 171* 141*   Remains slightly elevated on 5/12  Carb modified diet ordered 17. Acute systolic CHF   Filed Weights   11/11/19 0309 11/12/19 0320 11/13/19 0500  Weight: 67.6 kg 67.6 kg 66.5 kg   Stable on 5/12 18. Hypomagnesemia  Mag 1.2 on 5/12  Supplemented IV on 4/19, again on 4/23, again on 4/27, again on 5/5, again on 5/12, and  on 5/13  Oral supplementation initiated, increased on 4/30, increased again on 5/5, again on 5/12  Discussed with pharmacy potential offending agents  Discussed with nephro - continue supplementation  See #8 19.  Hypokalemia  Potassium 3.6 on 5/11  20. Mod/Severe Hypoalbuminemia  Supplement initiated 21.  Urinary frequency/Neurogenic bladder  PVRs ordered, only performed x1 with minimal residual  UA unremarkable, urine culture minimal growth, repeat UA relatively unremarkable   Keflex course completed on 4/29  Enablex started on 4/30, increased on 5/2  Flomax started on 5/5  Improving overall  Melatonin started on 5/3 due to psychological component  22. Left shoulder OA  See #3, seems improved  History of rotator cuff repair  Xray personally reviewed and with patient, OA.  Calcific tendinosis.   Will consider steroid injection if pain recurs  Relatively controlled on 5/12  > 35 minutes spent in total and counseling and coordination of care regarding functional status, magnesium, pain, etc.  LOS: 28 days A FACE TO FACE EVALUATION WAS PERFORMED  Brittney Kennedy Karis Juba 11/13/2019, 9:18 AM

## 2019-11-13 NOTE — Progress Notes (Addendum)
Physical Therapy Discharge Summary  Patient Details  Name: Brittney Kennedy MRN: [id number] Date of Birth: [birth year]  Today's Date: 11/13/2019    Patient has met 6 of 9 long term goals due to improved activity tolerance, improved balance, improved postural control, increased strength, ability to compensate for deficits, functional use of  left upper extremity and left lower extremity and improved coordination.  Patient to discharge at an ambulatory level supervision to [facility] depending on specific task.   Patient's care partner is independent to provide the necessary physical and cognitive assistance at discharge. Throughout her stay, patient has varied in levels of assist based on levels of hip pain and fatigue she is experiencing, however over the past several days has definitely demonstrated functional performance at a level of S-min guard at most (honestly all CGA provided recently by therapist has been very light bordering on supervision for functional transfers and gait).   Reasons goals not met: Did not meet gait goal as she was unable to consistently tolerate ambulation of 191f even with CGA, but was able to ambulate shorter distances with S-very light min guard level of assist; did not meet stair goal as she continues to require MinA for navigation of even a single step with RW due to UE weakness and impaired balance preventing her from manipulating RW safely. Continues to require MinA for LE management with car transfers.   Recommendation:  Patient will benefit from ongoing skilled PT services in home health setting to continue to advance safe functional mobility, address ongoing impairments in functional mobility, functional strength, balance, safety awareness and attention to L side, deficits in gait and stair training, and minimize fall risk.  Equipment: wheelchair with cushion and L half lap tray, RW, BSC, hospital bed   Reasons for discharge: treatment goals met and discharge from  hospital  Patient/family agrees with progress made and goals achieved: Yes  PT Discharge Precautions/Restrictions Precautions Precautions: Fall;Other (comment) Precaution Comments: aspiration precautions (HOB >30 degrees), significant L inattention/neglect, signifcant L hemiparesis (UE>LE with L shoulder subluxation) Restrictions Weight Bearing Restrictions: No Vital Signs Therapy Vitals Pulse Rate: 76 Resp: 20 BP: 135/65 Patient Position (if appropriate): Sitting Oxygen Therapy SpO2: 98 % O2 Device: Room Air Pain Pain Assessment Pain Scale: Faces Faces Pain Scale: No hurt Vision/Perception  Vision - Assessment Ocular Range of Motion: Restricted on the left Alignment/Gaze Preference: Gaze right Tracking/Visual Pursuits: Left eye does not track laterally;Unable to hold eye position out of midline;Decreased smoothness of horizontal tracking Additional Comments: has improved with tracking and scanning to the L but needs cues for compensatory techniques to attend and scan to L side Perception Perception: Impaired Inattention/Neglect: Does not attend to left side of body;Does not attend to left visual field Praxis Praxis: Intact Praxis Impairment Details: Motor planning Praxis-Other Comments: min impairment  Cognition Overall Cognitive Status: Impaired/Different from baseline Arousal/Alertness: Awake/alert Orientation Level: Oriented X4 Attention: Sustained Focused Attention: Impaired Sustained Attention: Impaired Memory: Impaired Problem Solving: Impaired Sequencing: Impaired Organizing: Impaired Self Correcting: Impaired Safety/Judgment: Impaired Sensation Sensation Light Touch: Impaired Detail Light Touch Impaired Details: Impaired LLE Hot/Cold: Not tested Proprioception: Impaired by gross assessment Proprioception Impaired Details: Impaired LUE;Impaired LLE Stereognosis: Impaired by gross assessment Additional Comments: continues to have difficulty with  proprioception and tracking of L LE and L UE, needs cues to use compensatory strategies and maintain safety Coordination Gross Motor Movements are Fluid and Coordinated: No Fine Motor Movements are Fluid and Coordinated: No Finger Nose Finger Test: unable to complete with L hand  Heel Shin Test: mod impairment due to weakness L LE, WFL on R LE Motor  Motor Motor: Hemiplegia;Abnormal tone;Other (comment);Abnormal postural alignment and control Motor - Discharge Observations: improved tone in LUE, some hypertone in shoulder and wrist, improved ability to maintain midline  Mobility Bed Mobility Bed Mobility: Rolling Right;Rolling Left;Supine to Sit;Sit to Supine Rolling Right: Supervision/verbal cueing(bed rails) Rolling Left: Supervision/Verbal cueing(bed rails) Supine to Sit: Supervision/Verbal cueing(bed features) Sit to Supine: Supervision/Verbal cueing(bed features) Transfers Transfers: Sit to Stand;Stand to Sit;Stand Pivot Transfers Sit to Stand: Supervision/Verbal cueing Stand to Sit: Supervision/Verbal cueing Stand Pivot Transfers: Supervision/Verbal cueing Stand Pivot Transfer Details: Verbal cues for sequencing;Verbal cues for technique;Verbal cues for precautions/safety;Verbal cues for safe use of DME/AE Locomotion  Gait Ambulation: Yes Gait Assistance: Supervision/Verbal cueing Gait Distance (Feet): 120 Feet Assistive device: Rolling walker Gait Assistance Details: Verbal cues for gait pattern;Verbal cues for safe use of DME/AE;Verbal cues for precautions/safety;Verbal cues for technique;Verbal cues for sequencing Gait Gait: Yes Gait Pattern: Impaired Gait Pattern: Step-through pattern;Decreased step length - right;Decreased stance time - left;Decreased stride length;Trendelenburg;Decreased trunk rotation;Trunk flexed;Narrow base of support;Poor foot clearance - left Stairs / Additional Locomotion Stairs: Yes Stairs Assistance: Minimal Assistance - Patient > 75% Stair  Management Technique: Two rails Number of Stairs: 8 Height of Stairs: 4 Wheelchair Mobility Wheelchair Mobility: No  Trunk/Postural Assessment  Cervical Assessment Cervical Assessment: Within Functional Limits Thoracic Assessment Thoracic Assessment: Exceptions to WFL(kyphotic) Lumbar Assessment Lumbar Assessment: Exceptions to WFL(posteiror pelvic tilt) Postural Control Postural Control: Deficits on evaluation Trunk Control: S for static and dynamic sitting balance, min guard-MinA for standing trunk control Postural Limitations: decreased, able to perform standing tasks with S-min guard and has really improved in past couple of days  Balance Balance Balance Assessed: Yes Static Sitting Balance Static Sitting - Balance Support: Feet supported Static Sitting - Level of Assistance: 7: Independent Dynamic Sitting Balance Dynamic Sitting - Level of Assistance: 5: Stand by assistance Static Standing Balance Static Standing - Level of Assistance: 5: Stand by assistance(S-CGA) Dynamic Standing Balance Dynamic Standing - Level of Assistance: 4: Min assist Extremity Assessment  RUE Assessment RUE Assessment: Not tested LUE Assessment LUE Assessment: Not tested RLE Assessment RLE Assessment: Exceptions to [facility] General Strength Comments: generally and functionally 4+/5, at times limited by chronic R hip pain LLE Assessment LLE Assessment: Exceptions to [facility] General Strength Comments: functionally 3 to 3+/5, active movement but L LE will buckle when very fatigued or when R hip is hurting   Windell Norfolk, DPT, PN1   Supplemental Physical Therapist [place]    Pager [phone] Acute Rehab Office [phone]

## 2019-11-13 NOTE — Progress Notes (Signed)
Team Conference Report to Patient/Family  Team Conference discussion was reviewed with the patient and caregiver, including goals, any changes in plan of care and target discharge date.  Patient and caregiver express understanding and are in agreement.  The patient has a target discharge date of 11/14/19. Son would like follow up call from MD.  Andria Rhein 11/13/2019, 2:46 PM

## 2019-11-13 NOTE — Progress Notes (Signed)
Social Work Patient ID: Brittney Kennedy, female   DOB: 12/14/1950, 69 y.o.   MRN: [id number]  SW received notification from [employer] patient bed order was cancelled/declined by son.

## 2019-11-13 NOTE — Progress Notes (Signed)
Occupational Therapy Session Note  Patient Details  Name: Brittney Kennedy MRN: [id number] Date of Birth: Feb 19, 1951  Today's Date: 11/13/2019 OT Individual Time: 1300-1400 OT Individual Time Calculation (min): 60 min    Short Term Goals: Week 1:  OT Short Term Goal 1 (Week 1): Pt will be able to use L hand as a stabilizing A with UB bathing. OT Short Term Goal 1 - Progress (Week 1): Progressing toward goal OT Short Term Goal 2 (Week 1): Pt will be able to find items on her food tray on L side with min cues. OT Short Term Goal 2 - Progress (Week 1): Met OT Short Term Goal 3 (Week 1): Pt will complete squat pivot to toilet with mod A. OT Short Term Goal 3 - Progress (Week 1): Met OT Short Term Goal 4 (Week 1): Pt will don a shirt with mod A. OT Short Term Goal 4 - Progress (Week 1): Met OT Short Term Goal 5 (Week 1): Pt will rise to stand with mod A. OT Short Term Goal 5 - Progress (Week 1): Met  Skilled Therapeutic Interventions/Progress Updates:    1:1. Pt received in w/c with son and aide, sylvia present. Pt agreeable to bathing and dressing. Edu re interpreter for sylvia about CVA reccovery, L inattention, providing A at hips for steadying, hemi dressing technques, standing balance, and providing minimal A during functional task to improve pt independence. Pt completes bathing/dressing with syliva. Bathing seated with A from sylvia to keep track of body parts washed and hand pt items from seated level. MIN A UB dressing with sylvia coached on hemi dressing techniques. Pt able to thread BLE into pants and underwear and MIN A provided by sylvia to stand/advance pants pas hips. Pt/sylvia edu re shower transfer and backing up to shower ledge and stepping. Pt able to demo with MIN A provided by syliva. All questions answered and pt left in w/c with belt alarm on and call light in reach  Therapy Documentation Precautions:  Precautions Precautions: Fall, Other (comment) Precaution Comments:  aspiration precautions (HOB >30 degrees), significant L inattention/neglect, signifcant L hemiparesis (UE>LE with L shoulder subluxation) Restrictions Weight Bearing Restrictions: No General:   Vital Signs:  Pain: Pain Assessment Pain Scale: 0-10 Pain Score: 0-No pain ADL: ADL Grooming: Supervision/safety Where Assessed-Grooming: Sitting at sink Upper Body Bathing: Supervision/safety Where Assessed-Upper Body Bathing: Shower Lower Body Bathing: Contact guard Where Assessed-Lower Body Bathing: Shower Upper Body Dressing: Supervision/safety Where Assessed-Upper Body Dressing: Wheelchair Lower Body Dressing: Minimal assistance Where Assessed-Lower Body Dressing: Wheelchair Toileting: Contact guard Where Assessed-Toileting: Glass blower/designer: Therapist, music Method: Counselling psychologist: Grab bars, Raised toilet seat Social research officer, government: Curator Method: Heritage manager: Civil engineer, contracting with back, Landscape architect   Exercises:   Other Treatments:     Therapy/Group: Individual Therapy  Tonny Branch 11/13/2019, 1:59 PM

## 2019-11-13 NOTE — Progress Notes (Signed)
Chaplain responded to request for notary of document.  The document was a Engineer, agricultural which is not under Chaplain's purview.  Patient and patient's family understood.  Vernell Morgans Chaplain Resident

## 2019-11-13 NOTE — Progress Notes (Signed)
Physical Therapy Session Note  Patient Details  Name: Brittney Kennedy MRN: [id number] Date of Birth: 10-Feb-1951  Today's Date: 11/13/2019 PT Individual Time: 1402-1430 PT Individual Time Calculation (min): 28 min   Short Term Goals: Week 4:  PT Short Term Goal 1 (Week 4): STGs = LTGs due to ELOS  Skilled Therapeutic Interventions/Progress Updates:    Patient received up in [facility], pleasant and willing to participate in therapy; interpreter not present as scheduled but [name] able to translate adequately enough to continue family education with Brittney Kennedy. Focused today on family education including navigation of step and car transfers- Brittney Kennedy able to guard patient well and provide adequate non-verbal cues during transfers, step navigation, and mock car transfer today. Neither the patient or family voices any questions. Patient left up in [facility] with safety seatbelt active and all needs otherwise met, family present.   Therapy Documentation Precautions:  Precautions Precautions: Fall, Other (comment) Precaution Comments: aspiration precautions (HOB >30 degrees), significant L inattention/neglect, signifcant L hemiparesis (UE>LE with L shoulder subluxation) Restrictions Weight Bearing Restrictions: No Pain: Pain Assessment Pain Scale: Faces Faces Pain Scale: No hurt    Therapy/Group: Individual Therapy   Windell Norfolk, DPT, PN1   Supplemental Physical Therapist [place]    Pager [phone] Acute Rehab Office [phone]    11/13/2019, 3:43 PM

## 2019-11-14 ENCOUNTER — Inpatient Hospital Stay (HOSPITAL_COMMUNITY): Payer: PRIVATE HEALTH INSURANCE | Admitting: Physical Therapy

## 2019-11-14 LAB — GLUCOSE, CAPILLARY
Glucose-Capillary: 146 mg/dL — ABNORMAL HIGH (ref 70–99)
Glucose-Capillary: 140 mg/dL — ABNORMAL HIGH (ref 70–99)

## 2019-11-14 MED ORDER — MAGNESIUM GLUCONATE 500 MG PO TABS: 500.0000 mg | ORAL_TABLET | Freq: Four times a day (QID) | ORAL | 0 refills | Status: DC

## 2019-11-14 NOTE — Progress Notes (Signed)
Recreational Therapy Discharge Summary Patient Details  Name: Moet Mikulski MRN: [id number] Date of Birth: 1950-09-02 Today's Date: 11/14/2019  Long term goals set: 1  Long term goals met: 1  Comments on progress toward goals: Pt has made good progress during LOS progressing to overall supervision-min assist level.  TR sessions focused on activity analysis identifying potential modifications, visual scanning, attention to task, LUE use, dynamic sitting and standing balance during leisure activities.  Pt is discharging home today with family to provide/coordinate 24 hour care.  Goal met.  Reasons for discharge: discharge from hospital  Patient/family agrees with progress made and goals achieved: Yes  Kit Mollett 11/14/2019, 8:50 AM

## 2019-11-14 NOTE — Plan of Care (Signed)
  Problem: Consults Goal: RH STROKE PATIENT EDUCATION Description: See Patient Education module for education specifics  Outcome: Completed/Met Goal: Nutrition Consult-if indicated Outcome: Completed/Met   Problem: RH BOWEL ELIMINATION Goal: RH STG MANAGE BOWEL WITH ASSISTANCE Description: STG Manage Bowel with mod I Assistance. Outcome: Completed/Met Goal: RH STG MANAGE BOWEL W/MEDICATION W/ASSISTANCE Description: STG Manage Bowel with Medication with mod I Assistance. Outcome: Completed/Met   Problem: RH BLADDER ELIMINATION Goal: RH STG MANAGE BLADDER WITH ASSISTANCE Description: STG Manage Bladder With mod I Assistance Outcome: Completed/Met   Problem: RH SKIN INTEGRITY Goal: RH STG SKIN FREE OF INFECTION/BREAKDOWN Description: Patient will not have new areas of skin break down  Outcome: Completed/Met Goal: RH STG MAINTAIN SKIN INTEGRITY WITH ASSISTANCE Description: STG Maintain Skin Integrity With mod I Assistance. Outcome: Completed/Met   Problem: RH SAFETY Goal: RH STG ADHERE TO SAFETY PRECAUTIONS W/ASSISTANCE/DEVICE Description: STG Adhere to Safety Precautions With cues/reminders assistance/Device. Outcome: Completed/Met   Problem: RH COGNITION-NURSING Goal: RH STG USES MEMORY AIDS/STRATEGIES W/ASSIST TO PROBLEM SOLVE Description: STG Uses Memory Aids/Strategies With cues/reminders Assistance to Problem Solve. Outcome: Completed/Met Goal: RH STG ANTICIPATES NEEDS/CALLS FOR ASSIST W/ASSIST/CUES Description: STG Anticipates Needs/Calls for Assist With cues/reminders Assistance/Cues. Outcome: Completed/Met   Problem: RH PAIN MANAGEMENT Goal: RH STG PAIN MANAGED AT OR BELOW PT'S PAIN GOAL Description: Pain scale <4/10 Outcome: Completed/Met   Problem: RH KNOWLEDGE DEFICIT Goal: RH STG INCREASE KNOWLEDGE OF HYPERTENSION Description: Patient will maintain SBP less than 150 with cues/reminders forusing medications, dietary restrictions and handouts  Outcome:  Completed/Met Goal: RH STG INCREASE KNOWLEDGE OF DYSPHAGIA/FLUID INTAKE Description: Patient will tolerate full diet with thin liquids with cues/reminders for restrictions Outcome: Completed/Met Goal: RH STG INCREASE KNOWLEDGE OF STROKE PROPHYLAXIS Description: Patient will adhere to medication regimen and dietary modifications Outcome: Completed/Met   Problem: Consults Goal: RH GENERAL PATIENT EDUCATION Description: See Patient Education module for education specifics. Outcome: Completed/Met   Problem: RH Vision Goal: RH LTG Vision (Specify) Outcome: Completed/Met

## 2019-11-14 NOTE — Progress Notes (Signed)
Patient discharged off of unit with all belongings. Discharge papers/instructions explained by physician assistant to family. Patient and family have no further questions at time of discharge. No complications noted at this time.  Evi Mccomb L Azaylah Stailey  

## 2019-11-14 NOTE — Progress Notes (Signed)
[facility] PHYSICAL MEDICINE & REHABILITATION PROGRESS NOTE  Subjective/Complaints: Patient seen laying in bed this morning.  She states she slept well overnight.  She is ready for discharge.  ROS: Denies CP, SOB, N/V/D  Objective: Vital Signs: Blood pressure (!) 115/41, pulse 66, temperature 97.8 F (36.6 C), temperature source Oral, resp. rate 16, height 5\' 2"  (1.575 m), weight 66.2 kg, SpO2 98 %. No results found. No results for input(s): WBC, HGB, HCT, PLT in the last 72 hours. Recent Labs    11/12/19 0509  NA 141  K 3.6  CL 107  CO2 23  GLUCOSE 153*  BUN 16  CREATININE 0.83  CALCIUM 10.2    Physical Exam: BP (!) 115/41 (BP Location: Right Arm)   Pulse 66   Temp 97.8 F (36.6 C) (Oral)   Resp 16   Ht 5\' 2"  (1.575 m)   Wt 66.2 kg   SpO2 98%   BMI 26.69 kg/m   Constitutional: No distress . Vital signs reviewed. HENT: Normocephalic.  Atraumatic. Eyes: EOMI. No discharge. Cardiovascular: No JVD. Respiratory: Normal effort.  No stridor. GI: Non-distended. Skin: Warm and dry.  Intact. Psych: Flat. Musc: No edema in extremities.  No tenderness in extremities. Neuro: Alert Motor:  LUE: Shoulder abduction 3/5, distally 4/5, unchanged LLE: 4/5 proximal to distal (?  Some pain inhibition), unchanged  Assessment/Plan: 1. Functional deficits secondary to bilateral embolic infarcts.  Which require 3+ hours per day of interdisciplinary therapy in a comprehensive inpatient rehab setting.  Physiatrist is providing close team supervision and 24 hour management of active medical problems listed below.  Physiatrist and rehab team continue to assess barriers to discharge/monitor patient progress toward functional and medical goals  Care Tool:  Bathing    Body parts bathed by patient: Right arm, Left arm, Chest, Abdomen, Front perineal area, Buttocks, Right upper leg, Left upper leg, Right lower leg, Left lower leg, Face   Body parts bathed by helper: Right arm,  Buttocks Body parts n/a: Right arm, Left arm   Bathing assist Assist Level: Contact Guard/Touching assist     Upper Body Dressing/Undressing Upper body dressing   What is the patient wearing?: Pull over shirt    Upper body assist Assist Level: Minimal Assistance - Patient > 75%    Lower Body Dressing/Undressing Lower body dressing      What is the patient wearing?: Pants, Incontinence brief     Lower body assist Assist for lower body dressing: Minimal Assistance - Patient > 75%     Toileting Toileting    Toileting assist Assist for toileting: Contact Guard/Touching assist     Transfers Chair/bed transfer  Transfers assist     Chair/bed transfer assist level: Supervision/Verbal cueing     Locomotion Ambulation   Ambulation assist      Assist level: Supervision/Verbal cueing Assistive device: Walker-rolling Max distance: 181ft   Walk 10 feet activity   Assist     Assist level: Supervision/Verbal cueing Assistive device: Walker-rolling   Walk 50 feet activity   Assist Walk 50 feet with 2 turns activity did not occur: Safety/medical concerns  Assist level: Supervision/Verbal cueing Assistive device: Walker-rolling    Walk 150 feet activity   Assist Walk 150 feet activity did not occur: Safety/medical concerns(fatigue/hip pain)         Walk 10 feet on uneven surface  activity   Assist Walk 10 feet on uneven surfaces activity did not occur: Safety/medical concerns(fatigue/hip pain)  Wheelchair     Assist Will patient use wheelchair at discharge?: Yes Type of Wheelchair: Manual Wheelchair activity did not occur: Safety/medical concerns(fatigue/pain)         Wheelchair 50 feet with 2 turns activity    Assist    Wheelchair 50 feet with 2 turns activity did not occur: Safety/medical concerns(fatigue/pain)       Wheelchair 150 feet activity     Assist Wheelchair 150 feet activity did not occur:  Safety/medical concerns(fatigue/pain)          Medical Problem List and Plan: 1.  Altered mental status left hemiparesis secondary to right MCA infarction along with a small left CR and splenium infarct embolic pattern likely due to transient cardiomyopathy with low EF after cardiac catheterization for non-STEMI with stenting complicated by groin hematoma  DC today  Will see patient for transitional care management in 1-2 weeks post-discharge  Repeat CT unremarkable for changes 2.  Antithrombotics: -DVT/anticoagulation: SCDs             -antiplatelet therapy: Plavix 3. Pain Management: Tylenol as needed  Voltaren gel to bilateral knees ordered on 4/27, with improvement  Voltaren gel added to left shoulder (history of rotator cuff repain), with improvement  Controlled with meds on 5/13 4. Mood: Amantadine 100 mg twice daily, DC'd secondary to hallucinations- resolved  Melatonin 3 mg nightly             -antipsychotic agents: agitation resolved d/c haldol 5. Neuropsych: This patient is not capable of making decisions on her own behalf. 6. Skin/Wound Care: Routine skin checks 7. Fluids/Electrolytes/Nutrition: Routine in and outs.   BMP within acceptable range, except for glucose on 5/11 8.  GI bleed/duodenal ulcer.  Status post EGD 10/08/2019 with epi and hemostasis clips x5.  PPI twice daily             See #9  Per GI, continue Protonix BID dosing for now 9.  Acute blood loss anemia.    Hemoglobin 9.8 on 5/7  Continue to monitor 10.  AKI.  Resolved.  11.  Acute urinary tract infection.  Patient completed 5-day course amoxicillin 10/17/2019 12.  Post stroke dysphagia.  Dysphagia #3 thins liquids.  Follow-up speech therapy             Advance diet as tolerated 13.  Keratoconjunctivitis.  Follow-up ophthalmology services.  Completed 7-day course of erythromycin drops on 4/17. 14.  Hypertension.     Increased Coreg to 6.25 mg BID with meals  Discussed with pharmacy, started on Losartan  12.5 on 4/22, increased to 25 mg, increased to 50 on 4/28, increased to 75 on 5/1, increased to 100 on 5/11  Relatively controlled on 5/13 15.  Hyperlipidemia.  Lipitor 16.  Prediabetes.  Hemoglobin A1c 5.9.  SSI  Metformin 500 daily started on 5/4 CBG (last 3)  Recent Labs    11/13/19 1658 11/13/19 2119 11/14/19 0625  GLUCAP 117* 127* 146*   Slightly elevated, but?  Improving on 5/13, continue to monitor and ambulatory setting with further adjustments if necessary  Carb modified diet ordered 17. Acute systolic CHF   Filed Weights   11/12/19 0320 11/13/19 0500 11/14/19 0455  Weight: 67.6 kg 66.5 kg 66.2 kg   Stable on 5/13 18. Hypomagnesemia  Mag 1.2 on 5/12  Supplemented IV on 4/19, again on 4/23, again on 4/27, again on 5/5, again on 5/12, and on 5/13  Oral supplementation initiated, increased on 4/30, increased again on 5/5, again on 5/12  Discussed with pharmacy potential offending agents  Discussed with nephro - continue supplementation  See #8 19.  Hypokalemia  Potassium 3.6 on 5/11  20. Mod/Severe Hypoalbuminemia  Supplement initiated 21.  Urinary frequency/Neurogenic bladder  PVRs ordered, only performed x1 with minimal residual  UA unremarkable, urine culture minimal growth, repeat UA relatively unremarkable   Keflex course completed on 4/29  Enablex started on 4/30, increased on 5/2  Flomax started on 5/5  Improving overall  Melatonin started on 5/3 due to psychological component  22. Left shoulder OA  See #3, seems improved  History of rotator cuff repair  Xray personally reviewed and with patient, OA.  Calcific tendinosis.   Will consider steroid injection if pain recurs  Relatively controlled on 5/13  > 30 minutes spent in total in discharge planning between myself and PA regarding aforementioned, as well discussion magnesium levels, regarding DME equipment, follow-up appointments, follow-up therapies, discharge medications, discharge recommendations  LOS:  29 days A FACE TO FACE EVALUATION WAS PERFORMED  Brittney Kennedy 11/14/2019, 9:43 AM

## 2019-11-15 ENCOUNTER — Telehealth: Payer: Self-pay | Admitting: *Deleted

## 2019-11-15 NOTE — Progress Notes (Signed)
Inpatient Rehab Care Coordinator Discharge Note Inpatient Rehabilitation Care Coordinator  Discharge Note  The overall goal for the admission was met for:   Discharge location: Yes  Length of Stay: Yes  Discharge activity level: Yes  Home/community participation: Yes  Services provided included: MD, RD, PT, OT, SLP, RN, CM, TR, Pharmacy, Neuropsych and SW  Financial Services: Medicare  Follow-up services arranged: Home Health: [facility]  Comments (or additional information): PT OT ST   Patient/Family verbalized understanding of follow-up arrangements: Yes  Individual responsible for coordination of the follow-up plan: son, Nathaneil Canary [phone]  Confirmed correct DME delivered: Dyanne Iha 11/15/2019    Dyanne Iha

## 2019-11-15 NOTE — Telephone Encounter (Signed)
Belenda Cruise from [facility] called for frequency of 2wk8.

## 2019-11-18 ENCOUNTER — Telehealth: Payer: Self-pay

## 2019-11-18 NOTE — Telephone Encounter (Signed)
[name] returned call. He tells me Brittney Kennedy does not speak spanish and neither does he. I advised him we were calling to schedule a follow up appointment. Unable to do next available on 12-04-2019. Is going out of town tomorrow and would rather call back and schedule follow up when he returns. Has our number and will call back.

## 2019-11-18 NOTE — Telephone Encounter (Signed)
-----   Message from Cooper Render, CMA sent at 11/13/2019  8:20 AM EDT ----- Regarding: FW: Patient out of hospital?  ----- Message ----- From: Cooper Render, CMA Sent: 11/11/2019 To: Cooper Render, CMA Subject: Patient out of hospital?                       ----- Message -----  From: Benancio Deeds, MD  Sent: 10/11/2019  9:25 AM EDT  To: Cooper Render, CMA  Subject: outpatient follow up               Beverely Low,  This patient will need outpatient follow up with me in 1-2 months, nonurgent. Maybe we can scheduler her in the next 1-2 weeks, I don't think she will be leaving the hospital soon but wanted to put on your radar. Thanks! Hope you are having a good week.

## 2019-11-18 NOTE — Telephone Encounter (Signed)
Patient discharged from hospital 11-14-19.  Patient is spanish speaking. Jenne Campus, front office, called to schedule him for a hospital F/U appt with [facility] for Gi bleed/Duodenal ulcer.  Left message to call back to schedule.

## 2019-11-19 ENCOUNTER — Encounter: Payer: Self-pay | Admitting: Physical Medicine & Rehabilitation

## 2019-11-19 ENCOUNTER — Other Ambulatory Visit: Payer: Self-pay

## 2019-11-19 ENCOUNTER — Ambulatory Visit (HOSPITAL_COMMUNITY)
Admission: RE | Admit: 2019-11-19 | Discharge: 2019-11-19 | Disposition: A | Discharge status: Home / Self Care | Payer: Medicare Other | Source: Ambulatory Visit | Attending: Internal Medicine | Admitting: Internal Medicine

## 2019-11-19 ENCOUNTER — Encounter: Payer: Medicare Other | Attending: Physical Medicine & Rehabilitation | Admitting: Physical Medicine & Rehabilitation

## 2019-11-19 ENCOUNTER — Encounter (HOSPITAL_COMMUNITY): Payer: Self-pay | Admitting: Internal Medicine

## 2019-11-19 VITALS — BP 148/83 | HR 72 | Temp 97.7°F | Ht 62.0 in | Wt 149.2 lb

## 2019-11-19 VITALS — BP 122/80 | HR 73 | Wt 149.2 lb

## 2019-11-19 DIAGNOSIS — I10 Essential (primary) hypertension: Secondary | ICD-10-CM | POA: Insufficient documentation

## 2019-11-19 DIAGNOSIS — I69391 Dysphagia following cerebral infarction: Secondary | ICD-10-CM | POA: Diagnosis present

## 2019-11-19 DIAGNOSIS — I2510 Atherosclerotic heart disease of native coronary artery without angina pectoris: Secondary | ICD-10-CM | POA: Insufficient documentation

## 2019-11-19 DIAGNOSIS — Z79899 Other long term (current) drug therapy: Secondary | ICD-10-CM | POA: Diagnosis not present

## 2019-11-19 DIAGNOSIS — I5022 Chronic systolic (congestive) heart failure: Secondary | ICD-10-CM | POA: Diagnosis not present

## 2019-11-19 DIAGNOSIS — Z7902 Long term (current) use of antithrombotics/antiplatelets: Secondary | ICD-10-CM | POA: Diagnosis not present

## 2019-11-19 DIAGNOSIS — N319 Neuromuscular dysfunction of bladder, unspecified: Secondary | ICD-10-CM | POA: Insufficient documentation

## 2019-11-19 DIAGNOSIS — Z955 Presence of coronary angioplasty implant and graft: Secondary | ICD-10-CM | POA: Insufficient documentation

## 2019-11-19 DIAGNOSIS — R350 Frequency of micturition: Secondary | ICD-10-CM | POA: Insufficient documentation

## 2019-11-19 DIAGNOSIS — I749 Embolism and thrombosis of unspecified artery: Secondary | ICD-10-CM | POA: Diagnosis not present

## 2019-11-19 DIAGNOSIS — I252 Old myocardial infarction: Secondary | ICD-10-CM | POA: Diagnosis not present

## 2019-11-19 DIAGNOSIS — Z7984 Long term (current) use of oral hypoglycemic drugs: Secondary | ICD-10-CM | POA: Diagnosis not present

## 2019-11-19 DIAGNOSIS — Z823 Family history of stroke: Secondary | ICD-10-CM | POA: Insufficient documentation

## 2019-11-19 DIAGNOSIS — I69354 Hemiplegia and hemiparesis following cerebral infarction affecting left non-dominant side: Secondary | ICD-10-CM | POA: Diagnosis not present

## 2019-11-19 DIAGNOSIS — K922 Gastrointestinal hemorrhage, unspecified: Secondary | ICD-10-CM | POA: Insufficient documentation

## 2019-11-19 DIAGNOSIS — R7303 Prediabetes: Secondary | ICD-10-CM | POA: Diagnosis present

## 2019-11-19 DIAGNOSIS — Z8711 Personal history of peptic ulcer disease: Secondary | ICD-10-CM | POA: Diagnosis not present

## 2019-11-19 DIAGNOSIS — I63511 Cerebral infarction due to unspecified occlusion or stenosis of right middle cerebral artery: Secondary | ICD-10-CM | POA: Diagnosis present

## 2019-11-19 DIAGNOSIS — Z807 Family history of other malignant neoplasms of lymphoid, hematopoietic and related tissues: Secondary | ICD-10-CM | POA: Diagnosis not present

## 2019-11-19 DIAGNOSIS — I5021 Acute systolic (congestive) heart failure: Secondary | ICD-10-CM | POA: Diagnosis present

## 2019-11-19 NOTE — Progress Notes (Signed)
ADVANCED HF CLINIC NOTE  Primary Cardiologist: Rockey Situ  HPI:  Brittney Kennedy is a 69 year old with history of obesity and seasonal allergies. Sh presented to [facility] in 3/21 with progressive HF symptoms. Echo EF 20% Cath on 3/26 revealed severe multivessel CAD and LV dysfxn (EF 25%) she was transferred to [facility] for CHF optimization and CT surgical eval. - LAD 95% prox with severe diffuse disease throughout - RI 90% - OM-3 90%  - mPDA 90% small diffusely diseased  ECHO EF 25-30% RV ok. Mild MR. AoV sclerosis without stenosis.   On arriving to [facility] she had a fall and developed large right groin hematoma. Heparin stopped. She subsequently suffered an acute R MCA stroke. Emergent CT showed no LVO. Moved to ICU. On arrival to ICU developed CP and anterior ST elevation and taken to cath lab. Underwent stenting of LAD and RI. F/u echo showed EF 50%  While in hospital developed UGIB on DAPT. EGD on 10/08/15 showed duodenal ulcer which wac clipped. ASA stopped. Plavix continued.  Transferred to CIR where she underwent aggressive stroke rehab for severe RUE weakness, aphasia and left-sided neglect. Made great progress. Wall in rehab BP meds also titrated to control HTN,   Here with her son, Brittney Kennedy for post hospital f/u. Remains weak on left UE >LE. Still with some left-neglect. Denies CP or SOB. Has HHPT/OT who will be coming 2x/week.   ROS: All systems negative except as listed in HPI, PMH and Problem List.  SH:  Social History   Socioeconomic History  . Marital status: Unknown    Spouse name: Not on file  . Number of children: Not on file  . Years of education: Not on file  . Highest education level: Not on file  Occupational History  . Not on file  Tobacco Use  . Smoking status: Never Smoker  . Smokeless tobacco: Never Used  Substance and Sexual Activity  . Alcohol use: Never  . Drug use: Never  . Sexual activity: Not Currently  Other Topics Concern  . Not on file  Social History Narrative    . Not on file   Social Determinants of Health   Financial Resource Strain:   . Difficulty of Paying Living Expenses:   Food Insecurity:   . Worried About Charity fundraiser in the Last Year:   . Arboriculturist in the Last Year:   Transportation Needs:   . Film/video editor (Medical):   Marland Kitchen Lack of Transportation (Non-Medical):   Physical Activity:   . Days of Exercise per Week:   . Minutes of Exercise per Session:   Stress:   . Feeling of Stress :   Social Connections:   . Frequency of Communication with Friends and Family:   . Frequency of Social Gatherings with Friends and Family:   . Attends Religious Services:   . Active Member of Clubs or Organizations:   . Attends Archivist Meetings:   Marland Kitchen Marital Status:   Intimate Partner Violence:   . Fear of Current or Ex-Partner:   . Emotionally Abused:   Marland Kitchen Physically Abused:   . Sexually Abused:     FH:  Family History  Problem Relation Age of Onset  . Stroke Mother        died @ 59  . Multiple myeloma Father        died @ 23  . Multiple myeloma Sister     Past Medical History:  Diagnosis Date  . CAD (  coronary artery disease)    a. 09/2019 NSTEMI/Cath: LM nl, LAD 90p, 46m diffuse mid-dist dzs, LCX 953mV groove/OM2, RI 90p, RCA dominant, nl, RPDA 9020mF 25%, glob HK.  . HMarland KitchenrEF (heart failure with reduced ejection fraction) [place]  a. 09/2019 LV gram: EF 25%, global HK.  . PMarland KitchenD (peptic ulcer disease)   . Seasonal allergies     Current Outpatient Medications  Medication Sig Dispense Refill  . acetaminophen (TYLENOL) 325 MG tablet Take 2 tablets (650 mg total) by mouth every 4 (four) hours as needed for mild pain or headache.    . aMarland Kitchenorvastatin (LIPITOR) 80 MG tablet Take 1 tablet (80 mg total) by mouth daily at 6 PM. 30 tablet 0  . Calcium Carbonate-Vit D-Min (CALCIUM-VITAMIN D-MINERALS) 600-800 MG-UNIT CHEW Chew by mouth.    . carvedilol (COREG) 6.25 MG tablet Take 1 tablet (6.25 mg total) by mouth 2  (two) times daily with a meal. 60 tablet 0  . clopidogrel (PLAVIX) 75 MG tablet Take 1 tablet (75 mg total) by mouth daily with breakfast. 30 tablet 0  . darifenacin (ENABLEX) 15 MG 24 hr tablet Take 1 tablet (15 mg total) by mouth at bedtime. 30 tablet 0  . diclofenac Sodium (VOLTAREN) 1 % GEL Apply 2 g topically daily as needed.    . docusate sodium (COLACE) 100 MG capsule Take 100 mg by mouth 2 (two) times daily.    . hydrocortisone cream 1 % Apply 1 application topically 2 (two) times daily as needed for itching.    . losartan (COZAAR) 100 MG tablet Take 1 tablet (100 mg total) by mouth daily. 30 tablet 0  . magnesium gluconate (MAGONATE) 500 MG tablet Take 1 tablet (500 mg total) by mouth every 6 (six) hours. 240 tablet 0  . Melatonin 10 MG CAPS Take 20 mg by mouth daily as needed.     . metFORMIN (GLUCOPHAGE) 500 MG tablet Take 1 tablet (500 mg total) by mouth daily with breakfast. 30 tablet 0  . Multiple Vitamin (MULTIVITAMIN WITH MINERALS) TABS tablet Take 1 tablet by mouth daily.    . nitroGLYCERIN (NITROSTAT) 0.4 MG SL tablet Place 1 tablet (0.4 mg total) under the tongue every 5 (five) minutes as needed for chest pain. 30 tablet 12  . pantoprazole (PROTONIX) 40 MG tablet Take 1 tablet (40 mg total) by mouth 2 (two) times daily before a meal. 60 tablet 11  . tamsulosin (FLOMAX) 0.4 MG CAPS capsule Take 1 capsule (0.4 mg total) by mouth daily after supper. 30 capsule 0   No current facility-administered medications for this encounter.    Vitals:   11/19/19 1011  BP: 122/80  Pulse: 73  SpO2: 100%  Weight: 67.7 kg (149 lb 3.2 oz)    PHYSICAL EXAM:  General:  Well appearing. No resp difficulty HEENT: normal Neck: supple. JVP flat. Carotids 2+ bilaterally; no bruits. No lymphadenopathy or thryomegaly appreciated. Cor: PMI normal. Regular rate & rhythm. No rubs, gallops or murmurs. Lungs: clear Abdomen: soft, nontender, nondistended. No hepatosplenomegaly. No bruits or masses.  Good bowel sounds. Extremities: no cyanosis, clubbing, rash, edema Neuro: alert & orientedx3, cranial nerves grossly intact. RUE weak. Affect pleasnt   ECG: NSR 75 anterolateral TWI. Personally reviewed   ASSESSMENT & PLAN: 1. CAD - Cath 3/21 with severe MV disease - s/p PCI/stent LAD and RI 3/21 - No s/s of angina - Continue statin/Plavix/b-blocker. Off evidence due to GIB  2. Transient LV dysfunction - EF 20% on  initial cath - EF normalized (50%) post stenting  3. HTN - BP running high at home at times - will follow with home cuff several times per week.  - if SBP consistently > 140. Contact me.   4. R MCA CVA - likely cardio-embolic.  - improving. Continue outpatient PT/OT  5. PUD - s/p clipping of duodenal ulcer 4/21  She would like to f/u with Dr. Rockey Situ at [facility]. We will arrange.   Glori Bickers, MD  11:45 PM

## 2019-11-19 NOTE — Telephone Encounter (Signed)
Notified approved POC.

## 2019-11-19 NOTE — Patient Instructions (Signed)
Please follow up with [facility] in [place] on June 21st at 11am  Follow up with [place] AS NEEDED

## 2019-11-19 NOTE — Progress Notes (Signed)
Subjective:    Patient ID: Brittney Kennedy, female    DOB: 12-09-1950, 69 y.o.   MRN: [id number]  HPI Right-handed female with unremarkable past medical history present for follow up for right MCA infarction along with a small left CR and splenium infarct embolic pattern likely due to transient cardiomyopathy with low EF after cardiac catheterization for non-STEMI with stenting complicated by groin hematoma.  Son provides history. At discharge she was instructed to follow up with GI and Optho, which they have yet to do.  She saw HF this AM and has schedule an appointment with Cards. She sees PCP in 01/28/20. She states her left shoulder is controlled, but states she has right shoulder pain. She states she has CTS in her right and tendonitis in her thumb, per patient.  She is still on PPI. She notes swallowing is controlled with head turns. She is still on D3 diet. BP is slightly elevated, and is planning on checking BP at home.  She has not been checking CBGs. She has not been checking her weights. She continues to have urinary freq. She is still on flomax, but picked Enablex yesterday.  Therapies: 2-3/week DME: Bedside commode Mobility: Walker at all times  Pain Inventory Average Pain 5 Pain Right Now 4 My pain is intermittent and aching  In the last 24 hours, has pain interfered with the following? General activity 4 Relation with others 0 Enjoyment of life 5 What TIME of day is your pain at its worst? evening, night Sleep (in general) Fair  Pain is worse with: unsure and some activites Pain improves with: rest and medication Relief from Meds: 5  Mobility walk with assistance use a walker how many minutes can you walk? 5 ability to climb steps?  no do you drive?  no use a wheelchair needs help with transfers Do you have any goals in this area?  yes  Function retired I need assistance with the following:  dressing, bathing, toileting, meal prep, household duties and  shopping  Neuro/Psych bladder control problems weakness trouble walking anxiety  Prior Studies hospital follow up  Physicians involved in your care hospital follow up   Family History  Problem Relation Age of Onset  . Stroke Mother        died @ 59  . Multiple myeloma Father        died @ 79  . Multiple myeloma Sister    Social History   Socioeconomic History  . Marital status: Unknown    Spouse name: Not on file  . Number of children: Not on file  . Years of education: Not on file  . Highest education level: Not on file  Occupational History  . Not on file  Tobacco Use  . Smoking status: Never Smoker  . Smokeless tobacco: Never Used  Substance and Sexual Activity  . Alcohol use: Never  . Drug use: Never  . Sexual activity: Not Currently  Other Topics Concern  . Not on file  Social History Narrative  . Not on file   Social Determinants of Health   Financial Resource Strain:   . Difficulty of Paying Living Expenses:   Food Insecurity:   . Worried About Charity fundraiser in the Last Year:   . Arboriculturist in the Last Year:   Transportation Needs:   . Film/video editor (Medical):   Marland Kitchen Lack of Transportation (Non-Medical):   Physical Activity:   . Days of Exercise per Week:   .  Minutes of Exercise per Session:   Stress:   . Feeling of Stress :   Social Connections:   . Frequency of Communication with Friends and Family:   . Frequency of Social Gatherings with Friends and Family:   . Attends Religious Services:   . Active Member of Clubs or Organizations:   . Attends Archivist Meetings:   Marland Kitchen Marital Status:    Past Surgical History:  Procedure Laterality Date  . CHOLECYSTECTOMY    . CORONARY STENT INTERVENTION N/A 09/30/2019   Procedure: CORONARY STENT INTERVENTION;  Surgeon: Martinique, Peter M, MD;  Location: [place] [facility];  Service: Cardiovascular;  Laterality: N/A;  . ESOPHAGOGASTRODUODENOSCOPY (EGD) WITH PROPOFOL N/A 10/08/2019    Procedure: ESOPHAGOGASTRODUODENOSCOPY (EGD) WITH PROPOFOL;  Surgeon: Yetta Flock, MD;  Location: [place];  Service: Gastroenterology;  Laterality: N/A;  . HEMOSTASIS CLIP PLACEMENT  10/08/2019   Procedure: HEMOSTASIS CLIP PLACEMENT;  Surgeon: Yetta Flock, MD;  Location: [place];  Service: Gastroenterology;;  . HEMOSTASIS CONTROL  10/08/2019   Procedure: HEMOSTASIS CONTROL;  Surgeon: Yetta Flock, MD;  Location: [place];  Service: Gastroenterology;;  . LEFT HEART CATH AND CORONARY ANGIOGRAPHY N/A 09/30/2019   Procedure: LEFT HEART CATH AND CORONARY ANGIOGRAPHY;  Surgeon: Martinique, Peter M, MD;  Location: [place] [facility];  Service: Cardiovascular;  Laterality: N/A;  . LEFT HEART CATH AND CORS/GRAFTS ANGIOGRAPHY N/A 09/27/2019   Procedure: LEFT HEART CATH AND CORS/GRAFTS ANGIOGRAPHY;  Surgeon: Minna Merritts, MD;  Location: [place] [facility];  Service: Cardiovascular;  Laterality: N/A;   Past Medical History:  Diagnosis Date  . CAD (coronary artery disease)    a. 09/2019 NSTEMI/Cath: LM nl, LAD 90p, 79m diffuse mid-dist dzs, LCX 934mV groove/OM2, RI 90p, RCA dominant, nl, RPDA 9039mF 25%, glob HK.  . HMarland KitchenrEF (heart failure with reduced ejection fraction) ([place]  a. 09/2019 LV gram: EF 25%, global HK.  . PMarland KitchenD (peptic ulcer disease)   . Seasonal allergies    BP (!) 148/83   Pulse 72   Temp 97.7 F (36.5 C)   Ht 5' 2"  (1.575 m)   Wt 149 lb 3.2 oz (67.7 kg)   SpO2 100%   BMI 27.29 kg/m   Opioid Risk Score:   Fall Risk Score:  `1  Depression screen PHQ 2/9  Depression screen PHQ 2/9 11/19/2019  Decreased Interest 0  Down, Depressed, Hopeless 1  PHQ - 2 Score 1  Altered sleeping 2  Tired, decreased energy 2  Change in appetite 1  Feeling bad or failure about yourself  0  Trouble concentrating 2  Moving slowly or fidgety/restless 2  Suicidal thoughts 0  PHQ-9 Score 10  Difficult doing work/chores Somewhat difficult    Review of Systems   Constitutional: Negative.   HENT: Negative.   Eyes: Negative.   Respiratory: Negative.   Cardiovascular: Negative.   Gastrointestinal: Negative.   Endocrine:       High blood sugar  Genitourinary: Positive for difficulty urinating.  Musculoskeletal: Positive for gait problem.  Neurological: Positive for weakness.  Psychiatric/Behavioral: The patient is nervous/anxious.   All other systems reviewed and are negative.      Objective:   Physical Exam Constitutional: NAD. Vital signs reviewed. HENT: Normocephalic.  Atraumatic. Eyes: EOMI. No discharge. Cardiovascular: No JVD. Respiratory: Normal effort.  No stridor. GI: Non-distended. Skin: Warm and dry.  Intact. Psych: Flat. Musc: No edema in extremities.  No tenderness in extremities. Neuro: Alert  Motor:  LUE: Shoulder abduction 3/5, distally 4+/5 RUE: Shoulder abduction 4+/5, distally 4/5 B/l LE: 4+/5 (right stronger than left)    Assessment & Plan:  Right-handed female with unremarkable past medical history present for follow up for right MCA infarction along with a small left CR and splenium infarct embolic pattern likely due to transient cardiomyopathy with low EF after cardiac catheterization for non-STEMI with stenting complicated by groin hematoma.   1. Altered mental status left hemiparesis secondary to right MCA infarction along with a small left CR and splenium infarct embolic pattern likely due to transient cardiomyopathy with low EF after cardiac catheterization for non-STEMI with stenting complicated by groin hematoma  Cont therapies  Cont follow up with heart failure  Follow up with Cards  Follow up with Neuro  2. Pain Management:   Cont tylenol as needed  Cont Voltaren gel prn  3.  GI bleed/duodenal ulcer.    Status post EGD 10/08/2019 with epi and hemostasis clips x5.  PPI twice daily             Continue Protonix BID dosing for now  Follow up with GI, needs to schedule  4.  Post stroke dysphagia.     Cont Dysphagia #3 thins liquids.    Cont therapies             Advance diet as tolerated  5.  Keratoconjunctivitis.    Follow-up ophthalmology services, needs appointment, information provided  6.  Hypertension.                Cont meds  Encouraged home monitoring  7.  Prediabetes.    Follow up with PCP  Cont metformin  8. Acute systolic CHF  Follow up with HF  Encouraged daily weight checks  9. Hypomagnesemia  Cont supplements  Will order labs  10.  Urinary frequency/Neurogenic bladder  Will refer to Urology  Cont Enablex, recently picked up med  Cont Flomax started on 5/5             Meds reviewed Referrals reviewed - needs Ophtho, Urology, GI, Neurology All questions answered  > 30 minutes spent in total with >25 in counseling in coordination of care regarding aforementioned and reviewing, as well as ordering lab work

## 2019-11-20 LAB — MAGNESIUM: Magnesium: 1.6 mg/dL (ref 1.6–2.3)

## 2019-11-21 DIAGNOSIS — I69354 Hemiplegia and hemiparesis following cerebral infarction affecting left non-dominant side: Secondary | ICD-10-CM | POA: Diagnosis not present

## 2019-11-21 DIAGNOSIS — R7303 Prediabetes: Secondary | ICD-10-CM

## 2019-11-21 DIAGNOSIS — I214 Non-ST elevation (NSTEMI) myocardial infarction: Secondary | ICD-10-CM

## 2019-11-21 DIAGNOSIS — I110 Hypertensive heart disease with heart failure: Secondary | ICD-10-CM

## 2019-11-21 DIAGNOSIS — I69311 Memory deficit following cerebral infarction: Secondary | ICD-10-CM

## 2019-11-21 DIAGNOSIS — E876 Hypokalemia: Secondary | ICD-10-CM

## 2019-11-21 DIAGNOSIS — Z8719 Personal history of other diseases of the digestive system: Secondary | ICD-10-CM

## 2019-11-21 DIAGNOSIS — I5022 Chronic systolic (congestive) heart failure: Secondary | ICD-10-CM

## 2019-11-21 DIAGNOSIS — I69391 Dysphagia following cerebral infarction: Secondary | ICD-10-CM | POA: Diagnosis not present

## 2019-11-21 DIAGNOSIS — M19012 Primary osteoarthritis, left shoulder: Secondary | ICD-10-CM

## 2019-11-21 DIAGNOSIS — R1310 Dysphagia, unspecified: Secondary | ICD-10-CM

## 2019-11-21 DIAGNOSIS — I97820 Postprocedural cerebrovascular infarction during cardiac surgery: Secondary | ICD-10-CM | POA: Diagnosis not present

## 2019-11-25 ENCOUNTER — Encounter: Payer: Medicare Other | Admitting: Physical Medicine & Rehabilitation

## 2019-11-27 ENCOUNTER — Other Ambulatory Visit: Payer: Self-pay

## 2019-11-27 ENCOUNTER — Ambulatory Visit (INDEPENDENT_AMBULATORY_CARE_PROVIDER_SITE_OTHER): Payer: Medicare Other | Admitting: Urology

## 2019-11-27 ENCOUNTER — Encounter: Payer: Self-pay | Admitting: Urology

## 2019-11-27 VITALS — BP 132/76 | HR 70 | Ht 62.0 in | Wt 179.0 lb

## 2019-11-27 DIAGNOSIS — R350 Frequency of micturition: Secondary | ICD-10-CM

## 2019-11-27 DIAGNOSIS — I749 Embolism and thrombosis of unspecified artery: Secondary | ICD-10-CM

## 2019-11-27 LAB — URINALYSIS, COMPLETE
Bilirubin, UA: NEGATIVE
Glucose, UA: NEGATIVE
Ketones, UA: NEGATIVE
Leukocytes,UA: NEGATIVE
Nitrite, UA: NEGATIVE
RBC, UA: NEGATIVE
Specific Gravity, UA: 1.025 (ref 1.005–1.030)
Urobilinogen, Ur: 0.2 mg/dL (ref 0.2–1.0)
pH, UA: 6 (ref 5.0–7.5)

## 2019-11-27 LAB — MICROSCOPIC EXAMINATION
Bacteria, UA: NONE SEEN
RBC, Urine: NONE SEEN /hpf (ref 0–2)

## 2019-11-27 LAB — BLADDER SCAN AMB NON-IMAGING: Scan Result: 0

## 2019-11-27 NOTE — Patient Instructions (Signed)
Your Home Program  General Guidelines for Pelvic Floor Exercise  Challenge your muscles to do more than they are used to doing. The quality of the exercise is more important that the number you perform.  Avoid straining, holding your breath or using buttock or leg muscles while you exercise the pelvic floor muscles.  Count out loud and continue breathing to avoid straining.  Relax your body and breathe during your exercises.  Coordinate your breathing with your pelvic floor contraction by blowing out or exhaling while you contract your pelvic floor muscles.   Concentrate on activating both the sphincters and levator ani muscles of the pelvic floor with each exercise.  Position for the Exercises  Start lying down with your knees bent and supported with pillows.  Once you've gained awareness and can feel the contractions you may perform the exercises either sitting or standing.  For example, you can do them while driving, working on the computer, or waiting in lines.  Quick Contractions - talk to your PT/OT.   Repeat this exercise xxx times.  Do the exercise xxx times per day.  Rapidly contract your pelvic floor muscles and hold for 2 seconds relax for 2 seconds.  Try to do the contraction on breathing exhalation.  Endurance Contractions  Repeat this xxx times.  Do the exercise xxx times per day.  Pull your pelvic floor muscles up and in and hold for xxx seconds then relax for xxx seconds.  Count out loud while you are holding the contraction to make sure that you are breathing throughout the exercise and not straining.  Other Exercises/Instructions    2007, [employer] Doc.37    Pelvic Floor Dysfunction  Pelvic floor dysfunction (PFD) is a condition that results when the group of muscles and connective tissues that support the organs in the pelvis (pelvic floor muscles) do not work well. These muscles and their connections form a sling that supports the colon  and bladder. In men, these muscles also support the prostate gland. In women, they also support the uterus. PFD causes pelvic floor muscles to be too weak, too tight, or a combination of both. In PFD, muscle movements are not coordinated. This condition may cause bowel or bladder problems. It may also cause pain. What are the causes? This condition may be caused by an injury to the pelvic area or by a weakening of pelvic muscles. This often results from pregnancy and childbirth or other types of strain. In many cases, the exact cause is not known. What increases the risk? The following factors may make you more likely to develop this condition:  Having a condition of chronic bladder tissue inflammation (interstitial cystitis).  Being an older person.  Being overweight.  Radiation treatment for cancer in the pelvic region.  Previous pelvic surgery, such as removal of the uterus (hysterectomy) or prostate gland (prostatectomy). What are the signs or symptoms? Symptoms of this condition vary and may include:  Bladder symptoms, such as: ? Trouble starting urination and emptying the bladder. ? Frequent urinary tract infections. ? Leaking urine when coughing, laughing, or exercising (stress incontinence). ? Having to pass urine urgently or frequently. ? Pain when passing urine.  Bowel symptoms, such as: ? Constipation. ? Urgent or frequent bowel movements. ? Incomplete bowel movements. ? Painful bowel movements. ? Leaking stool or gas.  Unexplained genital or rectal pain.  Genital or rectal muscle spasms.  Low back pain. In women, symptoms of PFD may also include:  A heavy, full,  or aching feeling in the vagina.  A bulge that protrudes into the vagina.  Pain during or after sexual intercourse. How is this diagnosed? This condition may be diagnosed based on:  Your symptoms and medical history.  A physical exam. During the exam, your health care provider may check your  pelvic muscles for tightness, spasm, pain, or weakness. This may include a rectal exam and a pelvic exam for women. In some cases, you may have diagnostic tests, such as:  Electrical muscle function tests.  Urine flow testing.  X-ray tests of bowel function.  Ultrasound of the pelvic organs. How is this treated? Treatment for this condition depends on your symptoms. Treatment options include:  Physical therapy. This may include Kegel exercises to help relax or strengthen the pelvic floor muscles.  Biofeedback. This type of therapy provides feedback on how tight your pelvic floor muscles are so that you can learn to control them.  Internal or external massage therapy.  A treatment that involves electrical stimulation of the pelvic floor muscles to help control pain (transcutaneous electrical nerve stimulation, or TENS).  Sound wave therapy (ultrasound) to reduce muscle spasms.  Medicines, such as: ? Muscle relaxants. ? Bladder control medicines. Surgery to reconstruct or support pelvic floor muscles may be an option if other treatments do not help. Follow these instructions at home: Activity  Do your usual activities as told by your health care provider. Ask your health care provider if you should modify any activities.  Do pelvic floor strengthening or relaxing exercises at home as told by your physical therapist. Lifestyle  Maintain a healthy weight.  Eat foods that are high in fiber, such as beans, whole grains, and fresh fruits and vegetables.  Limit foods that are high in fat and processed sugars, such as fried or sweet foods.  Manage stress with relaxation techniques such as yoga or meditation. General instructions  If you have problems with leakage: ? Use absorbable pads or wear padded underwear. ? Wash frequently with mild soap. ? Keep your genital and anal area as clean and dry as possible. ? Ask your health care provider if you should try a barrier cream to  prevent skin irritation.  Take warm baths to relieve pelvic muscle tension or spasms.  Take over-the-counter and prescription medicines only as told by your health care provider.  Keep all follow-up visits as told by your health care provider. This is important. Contact a health care provider if you:  Are not improving with home care.  Have signs or symptoms of PFD that get worse at home.  Develop new signs or symptoms at home.  Have signs of a urinary tract infection, such as: ? Fever. ? Chills. ? Urinary frequency. ? A burning feeling when urinating.  Have not had a bowel movement in 3 days (constipation). Summary  Pelvic floor dysfunction results when the muscles and connective tissues in your pelvic floor do not work well.  These muscles and their connections form a sling that supports your colon and bladder. In men, these muscles also support the prostate gland. In women, they also support the uterus.  PFD may be caused by an injury to the pelvic area or by a weakening of pelvic muscles.  PFD causes pelvic floor muscles to be too weak, too tight, or a combination of both. Symptoms may vary from person to person.  In most cases, PFD can be treated with physical therapies and medicines. Surgery may be an option if other treatments  do not help. This information is not intended to replace advice given to you by your health care provider. Make sure you discuss any questions you have with your health care provider. Document Revised: 01/08/2018 Document Reviewed: 01/08/2018 Elsevier Patient Education  2020 [employer].   Pelvic Floor Exercises for Bowel Control  Exercises using both the external anal sphincter and the deep pelvic floor muscles can help you to improve your bowel control. When done correctly, these exercises can tone and strengthen the muscles to help you hold back gas and prevent fecal incontinence (leakage of stool). Exercise programs take time; you may not  see any noticeable change in your bowel control immediately.  In some cases it may take several months to regain control.  Bowel Control Muscles The anus and the anal canal, has rings of muscle around it. The outer ring of muscle is called the external anal sphincter; it is a voluntary muscle which you can learn to tighten and close more efficiently. When you contract it you will feel the skin around your anus tighten and pull in as if the anus is winking. Try to keep the buttocks muscles relaxed. The inner ring around the anus is the internal anal sphincter. It is an involuntary and automatic muscle; you don't have to think to keep it closed or open.  This muscle should be closed at all times, except when you are actually trying to have a bowel movement.  In addition to the sphincter muscles, there are deeper muscles called the levator ani that form a sling from your tailbone to your pubic bone. The levator ani muscle has a specific part called the puborectalis that holds stool in until you give the signal to relax and empty.  When you contract these muscles it creates a feeling of lifting the anus inward.  External Anal Sphincter        Levator ani deep layer   Effective Exercises for Control of Gas and Bowels . Identify the specific areas of the pelvic floor muscles you need to use.  This can be done using a mirror to see if you are contracting the correct muscles or by placing the pad of your finger at or just inside the anal opening. . Develop an exercise plan for strength, endurance and quick response of the muscles and stick with it.  You must make the muscles do more than they are used to doing. . Incorporate the exercises into your daily activities.   2007, [employer] Doc.36

## 2019-11-27 NOTE — Progress Notes (Signed)
11/27/2019 11:30 AM   [name] 19-Jan-1951 [id number]  Referring provider: Jamse Arn, MD [address] [place],   [address]  Chief Complaint  Patient presents with  . Urinary Frequency    HPI:  Jeannetta was referred over for urinary frequency and microscopic hematuria.  She was recently hospitalized with a non-STEMI MI, heart failure and then suffered a right MCA infarct.  She was on tamsulosin and Enablex.  I did not see that she had urinary retention. She had some trouble voiding in hospital but was in bed and had to use a urinal and purewick. Voiding better now but she has some urgency. Noc x 5-6. No incontinence. Some constipation.   PVR normal.    A UA showed 6-10 red blood cells per high-powered field.  Urine culture less than 20,000 strep.  She underwent CT scan of the abdomen and pelvis on 09/26/2019 and 09/28/2019 with benign urinary tract.  There was a comment on delayed nephrograms on the second scan, but there was no hydronephrosis.  Her creatinine was normal at 0.7 - 0.8. She denies gross hematuria.   PMH: Past Medical History:  Diagnosis Date  . CAD (coronary artery disease)    a. 09/2019 NSTEMI/Cath: LM nl, LAD 90p, 64m diffuse mid-dist dzs, LCX 971mV groove/OM2, RI 90p, RCA dominant, nl, RPDA 9021mF 25%, glob HK.  . HMarland KitchenrEF (heart failure with reduced ejection fraction) [place]  a. 09/2019 LV gram: EF 25%, global HK.  . PMarland KitchenD (peptic ulcer disease)   . Seasonal allergies     Surgical History: Past Surgical History:  Procedure Laterality Date  . CHOLECYSTECTOMY    . CORONARY STENT INTERVENTION N/A 09/30/2019   Procedure: CORONARY STENT INTERVENTION;  Surgeon: JorMartiniqueeter M, MD;  Location: [facility];  Service: Cardiovascular;  Laterality: N/A;  . ESOPHAGOGASTRODUODENOSCOPY (EGD) WITH PROPOFOL N/A 10/08/2019   Procedure: ESOPHAGOGASTRODUODENOSCOPY (EGD) WITH PROPOFOL;  Surgeon: ArmYetta FlockD;  Location: [facility] Service: Gastroenterology;  Laterality: N/A;  . HEMOSTASIS CLIP PLACEMENT  10/08/2019   Procedure: HEMOSTASIS CLIP PLACEMENT;  Surgeon: ArmYetta FlockD;  [facility];;  . HEMOSTASIS CONTROL  10/08/2019   Procedure: HEMOSTASIS CONTROL;  Surgeon: ArmYetta FlockD;  [facility];;  . LEFT HEART CATH AND CORONARY ANGIOGRAPHY N/A 09/30/2019   Procedure: LEFT HEART CATH AND CORONARY ANGIOGRAPHY;  Surgeon: JorMartiniqueeter M, MD;  Location: [facility];  Service: Cardiovascular;  Laterality: N/A;  . LEFT HEART CATH AND CORS/GRAFTS ANGIOGRAPHY N/A 09/27/2019   Procedure: LEFT HEART CATH AND CORS/GRAFTS ANGIOGRAPHY;  Surgeon: GolMinna MerrittsD;  [facility];  Service: Cardiovascular;  Laterality: N/A;    Home Medications:  Allergies as of 11/27/2019      Reactions   Onion Anaphylaxis, Hives   Monosodium Glutamate Nausea Only   Headache Fatigue      Medication List       Accurate as of Nov 27, 2019 11:30 AM. If you have any questions, ask your nurse or doctor.        acetaminophen 325 MG tablet Commonly known as: TYLENOL Take 2 tablets (650 mg total) by mouth every 4 (four) hours as needed for mild pain or headache.   atorvastatin 80 MG tablet Commonly known as: LIPITOR Take 1 tablet (80 mg total) by mouth daily at 6 PM.   Calcium-Vitamin D-Minerals 600-800 MG-UNIT Chew  Chew by mouth.   carvedilol 6.25 MG tablet Commonly known as: COREG Take 1 tablet (6.25 mg total) by mouth 2 (two) times daily with a meal.   clopidogrel 75 MG tablet Commonly known as: PLAVIX Take 1 tablet (75 mg total) by mouth daily with breakfast.   darifenacin 15 MG 24 hr tablet Commonly known as: ENABLEX Take 1 tablet (15 mg total) by mouth at bedtime.   docusate sodium 100 MG capsule Commonly known as: COLACE Take 100 mg by mouth 2 (two) times daily.   hydrocortisone cream 1 % Apply 1  application topically 2 (two) times daily as needed for itching.   losartan 100 MG tablet Commonly known as: COZAAR Take 1 tablet (100 mg total) by mouth daily.   magnesium gluconate 500 MG tablet Commonly known as: MAGONATE Take 1 tablet (500 mg total) by mouth every 6 (six) hours.   Melatonin 10 MG Caps Take 20 mg by mouth daily as needed.   metFORMIN 500 MG tablet Commonly known as: GLUCOPHAGE Take 1 tablet (500 mg total) by mouth daily with breakfast.   multivitamin with minerals Tabs tablet Take 1 tablet by mouth daily.   nitroGLYCERIN 0.4 MG SL tablet Commonly known as: NITROSTAT Place 1 tablet (0.4 mg total) under the tongue every 5 (five) minutes as needed for chest pain.   pantoprazole 40 MG tablet Commonly known as: Protonix Take 1 tablet (40 mg total) by mouth 2 (two) times daily before a meal.   tamsulosin 0.4 MG Caps capsule Commonly known as: FLOMAX Take 1 capsule (0.4 mg total) by mouth daily after supper.   Voltaren 1 % Gel Generic drug: diclofenac Sodium Apply 2 g topically daily as needed.       Allergies:  Allergies  Allergen Reactions  . Onion Anaphylaxis and Hives  . Monosodium Glutamate Nausea Only    Headache Fatigue    Family History: Family History  Problem Relation Age of Onset  . Stroke Mother        died @ 11  . Multiple myeloma Father        died @ 67  . Multiple myeloma Sister     Social History:  reports that she has never smoked. She has never used smokeless tobacco. She reports that she does not drink alcohol or use drugs.   Physical Exam: There were no vitals taken for this visit.  Constitutional:  Alert and oriented, No acute distress. HEENT: [place] AT, moist mucus membranes.  Trachea midline, no masses. Cardiovascular: No clubbing, cyanosis, or edema. Respiratory: Normal respiratory effort, no increased work of breathing. GI: Abdomen is soft, nontender, nondistended, no abdominal masses GU: No CVA tenderness Skin: No  rashes, bruises or suspicious lesions. Neurologic: Grossly intact, no focal deficits, moving all 4 extremities. Psychiatric: Normal mood and affect.  Laboratory Data: Lab Results  Component Value Date   WBC 7.1 11/08/2019   HGB 9.8 (L) 11/08/2019   HCT 30.5 (L) 11/08/2019   MCV 95.0 11/08/2019   PLT 215 11/08/2019    Lab Results  Component Value Date   CREATININE 0.83 11/12/2019    No results found for: PSA  No results found for: TESTOSTERONE  Lab Results  Component Value Date   HGBA1C 5.9 (H) 09/26/2019    Urinalysis    Component Value Date/Time   COLORURINE YELLOW 11/03/2019 1102   APPEARANCEUR CLEAR 11/03/2019 1102   LABSPEC 1.019 11/03/2019 1102   PHURINE 5.0 11/03/2019 1102   [place] 11/03/2019 1102  HGBUR NEGATIVE 11/03/2019 1102   BILIRUBINUR NEGATIVE 11/03/2019 1102   [place] 11/03/2019 1102   PROTEINUR 100 (A) 11/03/2019 1102   NITRITE NEGATIVE 11/03/2019 1102   LEUKOCYTESUR TRACE (A) 11/03/2019 1102    Lab Results  Component Value Date   BACTERIA NONE SEEN 11/03/2019    Pertinent Imaging: CT images. Hospital notes, labs. Referring notes.  Results for orders placed during the hospital encounter of 09/27/19  DG Abd 1 View   Narrative CLINICAL DATA:  NG tube placement.  EXAM: ABDOMEN - 1 VIEW  COMPARISON:  None.  FINDINGS: The NG tube tip is seen in the lower chest at the T10 level, likely in the distal esophagus.  Bowel gas pattern is normal. Small amount of contrast in the kidneys and bladder. No acute bone abnormality.  IMPRESSION: NG tube tip in the distal esophagus.   Electronically Signed   By: Lorriane Shire M.D.   On: 10/01/2019 12:34    No results found for this or any previous visit. No results found for this or any previous visit. No results found for this or any previous visit. No results found for this or any previous visit. No results found for this or any previous visit. No results found for  this or any previous visit. No results found for this or any previous visit.  Assessment & Plan:    1. Urinary frequency Sounds like LUTS are improving. Discussed multifactorial nature of nocturia and OAB. Discussed addition of Gemtesa or Myrbetriq. Marden Noble (son, NICU nurse) on phone and helped. Also discussed PT/OT and she'll check with her PT/OT. I sent instructions. Work on constipation. Continue Enablex.  - Urinalysis, Complete - Bladder Scan (Post Void Residual) in office  2. Microscopic hematuria - I will see back for cystoscopy. CT benign.   No follow-ups on file.  Festus Aloe, MD  [facility] [address], [place], [place] [address] [phone]

## 2019-11-28 ENCOUNTER — Encounter (HOSPITAL_COMMUNITY): Payer: PRIVATE HEALTH INSURANCE | Admitting: Internal Medicine

## 2019-12-11 ENCOUNTER — Other Ambulatory Visit (HOSPITAL_COMMUNITY): Payer: Self-pay | Admitting: Pharmacist

## 2019-12-11 MED ORDER — CARVEDILOL 6.25 MG PO TABS: 6.2500 mg | ORAL_TABLET | Freq: Two times a day (BID) | ORAL | 3 refills | Status: DC

## 2019-12-11 MED ORDER — METFORMIN HCL 500 MG PO TABS: 500.0000 mg | ORAL_TABLET | Freq: Every day | ORAL | 3 refills | Status: DC

## 2019-12-11 MED ORDER — TAMSULOSIN HCL 0.4 MG PO CAPS: 0.4000 mg | ORAL_CAPSULE | Freq: Every day | ORAL | 2 refills | Status: DC

## 2019-12-11 MED ORDER — ATORVASTATIN CALCIUM 80 MG PO TABS: 80.0000 mg | ORAL_TABLET | Freq: Every day | ORAL | 3 refills | Status: AC

## 2019-12-11 MED ORDER — DARIFENACIN HYDROBROMIDE ER 15 MG PO TB24: 15.0000 mg | ORAL_TABLET | Freq: Every day | ORAL | 2 refills | Status: DC

## 2019-12-11 MED ORDER — CLOPIDOGREL BISULFATE 75 MG PO TABS: 75.0000 mg | ORAL_TABLET | Freq: Every day | ORAL | 3 refills | Status: AC

## 2019-12-11 MED ORDER — LOSARTAN POTASSIUM 100 MG PO TABS: 100.0000 mg | ORAL_TABLET | Freq: Every day | ORAL | 3 refills | Status: DC

## 2019-12-16 ENCOUNTER — Telehealth: Payer: Self-pay

## 2019-12-16 NOTE — Telephone Encounter (Signed)
Britany, ST from [facility] called recommending modified barium swallow study for patient. Patient lives in [place] if can be scheduled close to her house.

## 2019-12-16 NOTE — Telephone Encounter (Signed)
We can make a referral.  Thanks.

## 2019-12-17 ENCOUNTER — Encounter: Payer: Medicare Other | Attending: Physical Medicine & Rehabilitation | Admitting: Physical Medicine & Rehabilitation

## 2019-12-17 ENCOUNTER — Telehealth: Payer: Self-pay | Admitting: Physical Medicine & Rehabilitation

## 2019-12-17 ENCOUNTER — Encounter: Payer: Self-pay | Admitting: Physical Medicine & Rehabilitation

## 2019-12-17 ENCOUNTER — Other Ambulatory Visit: Payer: Self-pay

## 2019-12-17 VITALS — BP 150/77 | HR 64 | Temp 98.7°F | Ht 62.0 in | Wt 148.8 lb

## 2019-12-17 DIAGNOSIS — I5021 Acute systolic (congestive) heart failure: Secondary | ICD-10-CM | POA: Diagnosis not present

## 2019-12-17 DIAGNOSIS — I749 Embolism and thrombosis of unspecified artery: Secondary | ICD-10-CM | POA: Diagnosis not present

## 2019-12-17 DIAGNOSIS — I10 Essential (primary) hypertension: Secondary | ICD-10-CM | POA: Insufficient documentation

## 2019-12-17 DIAGNOSIS — R7303 Prediabetes: Secondary | ICD-10-CM | POA: Insufficient documentation

## 2019-12-17 DIAGNOSIS — I69391 Dysphagia following cerebral infarction: Secondary | ICD-10-CM

## 2019-12-17 DIAGNOSIS — I63511 Cerebral infarction due to unspecified occlusion or stenosis of right middle cerebral artery: Secondary | ICD-10-CM | POA: Diagnosis not present

## 2019-12-17 DIAGNOSIS — N319 Neuromuscular dysfunction of bladder, unspecified: Secondary | ICD-10-CM

## 2019-12-17 DIAGNOSIS — R350 Frequency of micturition: Secondary | ICD-10-CM | POA: Insufficient documentation

## 2019-12-17 DIAGNOSIS — K922 Gastrointestinal hemorrhage, unspecified: Secondary | ICD-10-CM | POA: Insufficient documentation

## 2019-12-17 NOTE — Telephone Encounter (Signed)
Referral placed.

## 2019-12-17 NOTE — Addendum Note (Signed)
Addended by: Becky Sax on: 12/17/2019 04:07 PM   Modules accepted: Orders

## 2019-12-17 NOTE — Progress Notes (Signed)
Subjective:    Patient ID: Brittney Kennedy, female    DOB: December 17, 1950, 69 y.o.   MRN: [id number]  TELEHEALTH NOTE  Due to national recommendations of social distancing due to COVID 19, an audio/video telehealth visit is felt to be most appropriate for this patient at this time.  See Chart message from today for the patient's consent to telehealth from [employer].     I verified that I am speaking with the correct person using two identifiers.  Location of patient: Home Location of provider: Office Method of communication: Webex Names of participants : Zorita Pang scheduling, Jasmine December obtaining consent and vitals if available Established patient  HPI Right-handed female with unremarkable past medical history present for follow up for right MCA infarction along with a small left CR and splenium infarct embolic pattern likely due to transient cardiomyopathy with low EF after cardiac catheterization for non-STEMI with stenting complicated by groin hematoma.  Last clinic on 11/19/19.  Sone supplements history. Since that time, she states she is still in therapies.  Communicated with son regarding meds. She is following up with Cards.  She has not seen Neurology.  She did not follow up GI.  Shoulder pain is exacerbated by humidity. She os occasionally using gel. She has had some coughing, concerns associated with eating.  Communicated with SLP, plans for MBS. She has not followed up with Optho. BP is mildly elevated. She obtained Mag level.  She saw Urology, and is following up. She notes improvement in strength.  Pain Inventory Average Pain 5 Pain Right Now 4 My pain is intermittent and aching  In the last 24 hours, has pain interfered with the following? General activity 0 Relation with others 0 Enjoyment of life 0 What TIME of day is your pain at its worst? evening, night Sleep (in general) Fair  Pain is worse with: unsure and some activites Pain  improves with: rest and medication Relief from Meds: 5  Mobility walk with assistance use a walker how many minutes can you walk? 5 ability to climb steps?  no do you drive?  no use a wheelchair needs help with transfers Do you have any goals in this area?  yes  Function retired I need assistance with the following:  meal prep, household duties and shopping  Neuro/Psych bladder control problems weakness trouble walking anxiety  Prior Studies Any changes since last visit?  no  Physicians involved in your care [place] therapies and Urologist   Family History  Problem Relation Age of Onset  . Stroke Mother        died @ 77  . Multiple myeloma Father        died @ 29  . Multiple myeloma Sister    Social History   Socioeconomic History  . Marital status: Divorced    Spouse name: Not on file  . Number of children: Not on file  . Years of education: Not on file  . Highest education level: Not on file  Occupational History  . Not on file  Tobacco Use  . Smoking status: Never Smoker  . Smokeless tobacco: Never Used  Substance and Sexual Activity  . Alcohol use: Never  . Drug use: Never  . Sexual activity: Not Currently  Other Topics Concern  . Not on file  Social History Narrative  . Not on file   Social Determinants of Health   Financial Resource Strain:   . Difficulty of Paying Living Expenses:  Food Insecurity:   . Worried About Charity fundraiser in the Last Year:   . Arboriculturist in the Last Year:   Transportation Needs:   . Film/video editor (Medical):   Marland Kitchen Lack of Transportation (Non-Medical):   Physical Activity:   . Days of Exercise per Week:   . Minutes of Exercise per Session:   Stress:   . Feeling of Stress :   Social Connections:   . Frequency of Communication with Friends and Family:   . Frequency of Social Gatherings with Friends and Family:   . Attends Religious Services:   . Active Member of Clubs or Organizations:   .  Attends Archivist Meetings:   Marland Kitchen Marital Status:    Past Surgical History:  Procedure Laterality Date  . CHOLECYSTECTOMY    . CORONARY STENT INTERVENTION N/A 09/30/2019   Procedure: CORONARY STENT INTERVENTION;  Surgeon: Martinique, Peter M, MD;  Location: [facility];  Service: Cardiovascular;  Laterality: N/A;  . ESOPHAGOGASTRODUODENOSCOPY (EGD) WITH PROPOFOL N/A 10/08/2019   Procedure: ESOPHAGOGASTRODUODENOSCOPY (EGD) WITH PROPOFOL;  Surgeon: Yetta Flock, MD;  Location: [facility];  Service: Gastroenterology;  Laterality: N/A;  . HEMOSTASIS CLIP PLACEMENT  10/08/2019   Procedure: HEMOSTASIS CLIP PLACEMENT;  Surgeon: Yetta Flock, MD;  Location: [facility];  Service: Gastroenterology;;  . HEMOSTASIS CONTROL  10/08/2019   Procedure: HEMOSTASIS CONTROL;  Surgeon: Yetta Flock, MD;  Location: [facility];  Service: Gastroenterology;;  . LEFT HEART CATH AND CORONARY ANGIOGRAPHY N/A 09/30/2019   Procedure: LEFT HEART CATH AND CORONARY ANGIOGRAPHY;  Surgeon: Martinique, Peter M, MD;  Location: [facility];  Service: Cardiovascular;  Laterality: N/A;  . LEFT HEART CATH AND CORS/GRAFTS ANGIOGRAPHY N/A 09/27/2019   Procedure: LEFT HEART CATH AND CORS/GRAFTS ANGIOGRAPHY;  Surgeon: Minna Merritts, MD;  Location: [facility];  Service: Cardiovascular;  Laterality: N/A;   Past Medical History:  Diagnosis Date  . CAD (coronary artery disease)    a. 09/2019 NSTEMI/Cath: LM nl, LAD 90p, 63m diffuse mid-dist dzs, LCX 958mV groove/OM2, RI 90p, RCA dominant, nl, RPDA 9055mF 25%, glob HK.  . HMarland KitchenrEF (heart failure with reduced ejection fraction) ([facility]  a. 09/2019 LV gram: EF 25%, global HK.  . PMarland KitchenD (peptic ulcer disease)   . Seasonal allergies    BP (!) 150/77 Comment: pt reported, webex  Pulse 64 Comment: pt reported, webex  Temp 98.7 F (37.1 C)   Ht 5' 2"  (1.575 m) Comment: pt reported, webex  Wt 148 lb 12.8 oz (67.5 kg) Comment: pt reported, webex   BMI 27.22 kg/m   Opioid Risk Score:   Fall Risk Score:  `1  Depression screen PHQ 2/9  Depression screen PHQ 2/9 11/19/2019  Decreased Interest 0  Down, Depressed, Hopeless 1  PHQ - 2 Score 1  Altered sleeping 2  Tired, decreased energy 2  Change in appetite 1  Feeling bad or failure about yourself  0  Trouble concentrating 2  Moving slowly or fidgety/restless 2  Suicidal thoughts 0  PHQ-9 Score 10  Difficult doing work/chores Somewhat difficult    Review of Systems  Constitutional: Negative.   HENT: Negative.   Eyes: Negative.   Respiratory: Negative.   Cardiovascular: Negative.   Gastrointestinal: Negative.   Endocrine:       High blood sugar  Genitourinary: Positive for difficulty urinating.  Musculoskeletal: Positive for gait problem.  Neurological: Positive for weakness.  Psychiatric/Behavioral:  The patient is nervous/anxious.   All other systems reviewed and are negative.      Objective:   Physical Exam Constitutional: NAD. Vital signs reviewed. HENT: Normocephalic.  Atraumatic. Eyes: EOMI. No discharge. Cardiovascular: No JVD. Respiratory: Normal effort.  No stridor. Psych: Flat. Neuro: Alert     Assessment & Plan:  Right-handed female with unremarkable past medical history present for follow up for right MCA infarction along with a small left CR and splenium infarct embolic pattern likely due to transient cardiomyopathy with low EF after cardiac catheterization for non-STEMI with stenting complicated by groin hematoma.     1. Altered mental status left hemiparesis secondary to right MCA infarction along with a small left CR and splenium infarct embolic pattern likely due to transient cardiomyopathy with low EF after cardiac catheterization for non-STEMI with stenting complicated by groin hematoma             Cont therapies             Continue follow up with heart failure             Follow up with Neuro, needs appointment   2. Pain Management:               Cont tylenol as needed             Cont Voltaren gel prn, encourage more frequent use   3.  GI bleed/duodenal ulcer.               Status post EGD 10/08/2019 with epi and hemostasis clips x5.  PPI twice daily             Continue Protonix BID dosing for now             Follow up with GI, needs to schedule, reminded   4.  Post stroke dysphagia.               Cont Dysphagia #3 thins liquids, plan for MBS             Cont therapies             Advance diet as tolerated   5.  Keratoconjunctivitis.               Follow-up ophthalmology services, needs appointment, information previously provided, reminded   6.  Hypertension.                Continue meds             Elevated on home monitoring  Encourage follow-up with Cards    7.  Prediabetes.               Follow up with PCP             Continue metformin   8. Acute systolic CHF             Continue to follow up with HF   9. Hypomagnesemia             Cont supplements             Labs on 5/18 with magnesium 1.6   10.  Urinary frequency/Neurogenic bladder             Continue follow-up with urology             Continue Enablex             Cont Flomax started on 5/5

## 2019-12-17 NOTE — Telephone Encounter (Signed)
Patient would like to have another Telehealth visit, due to transportation issues. She does not driver herself and her son lives in [place]

## 2019-12-17 NOTE — Telephone Encounter (Signed)
That's fine

## 2019-12-19 ENCOUNTER — Telehealth: Payer: Self-pay | Admitting: *Deleted

## 2019-12-19 ENCOUNTER — Other Ambulatory Visit: Payer: Self-pay | Admitting: Neurology

## 2019-12-19 DIAGNOSIS — I63511 Cerebral infarction due to unspecified occlusion or stenosis of right middle cerebral artery: Secondary | ICD-10-CM

## 2019-12-19 DIAGNOSIS — I69391 Dysphagia following cerebral infarction: Secondary | ICD-10-CM

## 2019-12-19 DIAGNOSIS — I6340 Cerebral infarction due to embolism of unspecified cerebral artery: Secondary | ICD-10-CM

## 2019-12-19 NOTE — Telephone Encounter (Signed)
Brittany Bullins ST called to request POC 2wk2 beginning 12/30/19. Approval given. She is also requesting a MBS order be placed for [facility])  I have spoken with Marylu Lund and it will be scheduled for 12/31/19 @12 :30 arriving at the [facility] Registration and should be there approx an hour. Regular breakfast and lite lunch. I have spoke with her son and given him this information.

## 2019-12-23 ENCOUNTER — Encounter: Payer: Self-pay | Admitting: Physician Assistant

## 2019-12-23 ENCOUNTER — Ambulatory Visit (INDEPENDENT_AMBULATORY_CARE_PROVIDER_SITE_OTHER): Payer: Medicare Other | Admitting: Physician Assistant

## 2019-12-23 ENCOUNTER — Other Ambulatory Visit: Payer: Self-pay

## 2019-12-23 VITALS — BP 148/62 | HR 74 | Ht 62.0 in | Wt 151.4 lb

## 2019-12-23 DIAGNOSIS — I10 Essential (primary) hypertension: Secondary | ICD-10-CM

## 2019-12-23 DIAGNOSIS — I2510 Atherosclerotic heart disease of native coronary artery without angina pectoris: Secondary | ICD-10-CM

## 2019-12-23 DIAGNOSIS — I214 Non-ST elevation (NSTEMI) myocardial infarction: Secondary | ICD-10-CM

## 2019-12-23 DIAGNOSIS — I5021 Acute systolic (congestive) heart failure: Secondary | ICD-10-CM

## 2019-12-23 DIAGNOSIS — Z8719 Personal history of other diseases of the digestive system: Secondary | ICD-10-CM

## 2019-12-23 DIAGNOSIS — I5022 Chronic systolic (congestive) heart failure: Secondary | ICD-10-CM | POA: Diagnosis not present

## 2019-12-23 DIAGNOSIS — Z955 Presence of coronary angioplasty implant and graft: Secondary | ICD-10-CM

## 2019-12-23 DIAGNOSIS — Z8673 Personal history of transient ischemic attack (TIA), and cerebral infarction without residual deficits: Secondary | ICD-10-CM

## 2019-12-23 MED ORDER — CARVEDILOL 12.5 MG PO TABS: 12.5000 mg | ORAL_TABLET | Freq: Two times a day (BID) | ORAL | 3 refills | Status: DC

## 2019-12-23 NOTE — Patient Instructions (Signed)
.  Medication Instructions:   Your physician has recommended you make the following change in your medication:   1.  INCREASE your Carvedilol (Coreg): Take 1 tablet (12.5 mg total) by mouth 2 (two) times daily with a meal.   *If you need a refill on your cardiac medications before your next appointment, please call your pharmacy*   Lab Work: Your physician recommends that you have labs drawn today. If you have labs (blood work) drawn today and your tests are completely normal, you will receive your results only by: Marland Kitchen MyChart Message (if you have MyChart) OR . A paper copy in the mail If you have any lab test that is abnormal or we need to change your treatment, we will call you to review the results.   Testing/Procedures: None Ordered.   Follow-Up: At [facility], you and your health needs are our priority.  As part of our continuing mission to provide you with exceptional heart care, we have created designated Provider Care Teams.  These Care Teams include your primary Cardiologist (physician) and Advanced Practice Providers (APPs -  Physician Assistants and Nurse Practitioners) who all work together to provide you with the care you need, when you need it.  We recommend signing up for the patient portal called "MyChart".  Sign up information is provided on this After Visit Summary.  MyChart is used to connect with patients for Virtual Visits (Telemedicine).  Patients are able to view lab/test results, encounter notes, upcoming appointments, etc.  Non-urgent messages can be sent to your provider as well.   To learn more about what you can do with MyChart, go to [email or web address].    Your next appointment:   1 month(s)  The format for your next appointment:   In Person  Provider:    You may see Julien Nordmann, MD or one of the following Advanced Practice Providers on your designated Care Team:    Nicolasa Ducking, NP  Eula Listen, PA-C  Marisue Ivan, PA-C  Gillian Shields, NP    Other Instructions  If your feeling dizzy, please let our office know.

## 2019-12-24 LAB — BASIC METABOLIC PANEL
BUN/Creatinine Ratio: 19 (ref 12–28)
BUN: 15 mg/dL (ref 8–27)
CO2: 22 mmol/L (ref 20–29)
Calcium: 10.6 mg/dL — ABNORMAL HIGH (ref 8.7–10.3)
Chloride: 103 mmol/L (ref 96–106)
Creatinine, Ser: 0.78 mg/dL (ref 0.57–1.00)
GFR calc Af Amer: 90 mL/min/{1.73_m2} (ref >59)
GFR calc non Af Amer: 78 mL/min/{1.73_m2} (ref >59)
Glucose: 122 mg/dL — ABNORMAL HIGH (ref 65–99)
Potassium: 4.6 mmol/L (ref 3.5–5.2)
Sodium: 141 mmol/L (ref 134–144)

## 2019-12-24 LAB — CBC
Hematocrit: 39.6 % (ref 34.0–46.6)
Hemoglobin: 13.2 g/dL (ref 11.1–15.9)
MCH: 29 pg (ref 26.6–33.0)
MCHC: 33.3 g/dL (ref 31.5–35.7)
MCV: 87 fL (ref 79–97)
Platelets: 234 10*3/uL (ref 150–450)
RBC: 4.55 x10E6/uL (ref 3.77–5.28)
RDW: 13 % (ref 11.7–15.4)
WBC: 7.7 10*3/uL (ref 3.4–10.8)

## 2019-12-24 NOTE — Progress Notes (Signed)
Office Visit    Patient Name: Brittney Kennedy Date of Encounter: 12/24/2019  Primary Care Provider:  Patient, No Pcp Per Primary Cardiologist:  Julien Nordmann, MD  Chief Complaint    Chief Complaint  Patient presents with  . other    No complaints today. Meds reviewed verbally with pt.    69 yo female with PMH of CAD, HFrEF (EF 20%), obesity, seasonal allergie, and seen today for follow-up and to establish care with the [place] office.  Past Medical History    Past Medical History:  Diagnosis Date  . CAD (coronary artery disease)    a. 09/2019 NSTEMI/Cath: LM nl, LAD 90p, 51m, diffuse mid-dist dzs, LCX 78m AV groove/OM2, RI 90p, RCA dominant, nl, RPDA 70m, EF 25%, glob HK.  Marland Kitchen HFrEF (heart failure with reduced ejection fraction) (HCC)    a. 09/2019 LV gram: EF 25%, global HK.  Marland Kitchen PUD (peptic ulcer disease)   . Seasonal allergies    Past Surgical History:  Procedure Laterality Date  . CHOLECYSTECTOMY    . CORONARY STENT INTERVENTION N/A 09/30/2019   Procedure: CORONARY STENT INTERVENTION;  Surgeon: Swaziland, Peter M, MD;  Location: [facility] INVASIVE CV LAB;  Service: Cardiovascular;  Laterality: N/A;  . ESOPHAGOGASTRODUODENOSCOPY (EGD) WITH PROPOFOL N/A 10/08/2019   Procedure: ESOPHAGOGASTRODUODENOSCOPY (EGD) WITH PROPOFOL;  Surgeon: Benancio Deeds, MD;  Location: [facility] ENDOSCOPY;  Service: Gastroenterology;  Laterality: N/A;  . HEMOSTASIS CLIP PLACEMENT  10/08/2019   Procedure: HEMOSTASIS CLIP PLACEMENT;  Surgeon: Benancio Deeds, MD;  Location: [facility] ENDOSCOPY;  Service: Gastroenterology;;  . HEMOSTASIS CONTROL  10/08/2019   Procedure: HEMOSTASIS CONTROL;  Surgeon: Benancio Deeds, MD;  Location: [facility] ENDOSCOPY;  Service: Gastroenterology;;  . LEFT HEART CATH AND CORONARY ANGIOGRAPHY N/A 09/30/2019   Procedure: LEFT HEART CATH AND CORONARY ANGIOGRAPHY;  Surgeon: Swaziland, Peter M, MD;  Location: [facility] INVASIVE CV LAB;  Service: Cardiovascular;  Laterality: N/A;  . LEFT HEART CATH AND  CORS/GRAFTS ANGIOGRAPHY N/A 09/27/2019   Procedure: LEFT HEART CATH AND CORS/GRAFTS ANGIOGRAPHY;  Surgeon: Antonieta Iba, MD;  Location: [facility] INVASIVE CV LAB;  Service: Cardiovascular;  Laterality: N/A;    Allergies  Allergies  Allergen Reactions  . Onion Anaphylaxis and Hives  . Monosodium Glutamate Nausea Only    Headache Fatigue    History of Present Illness    Brittney Kennedy is a 69 y.o. female with PMH as above.   She presented to [facility] 09/22/2019 with progressive heart failure symptoms.  Echo showed EF 20%, mild MR, and aortic valve sclerosis without stenosis.  Cardiac cath 3/26 showed severe multivessel CAD and LV dysfunction (EF 25%).  LHC showed pLAD 90% stenosed with severe diffuse disease throughout (dLAD1 90%s, dLAD2, 50%s), OM3 90%, ramus lesion 90%, and rPDA 90%, small, and diffusely diseased.  She was transferred to [facility] in [place] for CHF/volume optimization and CT surgical evaluation.    On arrival at [facility], she had a fall and developed a large right groin hematoma.  Heparin was stopped and she was subsequently suffered an acute R MCA stroke.  On review of notes, it was thought this R MCA CVA was likely cardioembolic.  She was moved to the ICU.  On arrival to the ICU, she developed chest pain and anterior ST elevation and was taken to the catheterization lab. Repeat cath showed pLAD 90%s with DESx1 and mLCx 95%s with DES x1. Follow-up echo showed EF 50%.  While in the hospital, she developed upper GI bleed on  DAPT.  EGD 10/08/2015 showed duodenal ulcer, which was subsequently clipped.  ASA was stopped and Plavix continued.  She was transferred to CIR, where she underwent aggressive stroke rehabilitation for severe right upper extremity weakness, aphasia, and left-sided neglect.    She was seen at post hospital follow-up 11/19/2019 by Dr. Augustina Mood and was joined by her son, [name].  It was noted she remained weak on the left upper extremity more than lower extremity.   She still had some left-sided neglect.  She denied any chest pain or shortness of breath.  She reported having home health care and OT coming 2 times per week.  Her BP was running high at home at times with recommendation to increase her antihypertensive if SBP consistently above 140.   Today, she returns to clinic and reports she is doing well. She denies chest pain, palpitations, dyspnea, pnd, orthopnea, n, v, dizziness, syncope, edema, weight gain, or early satiety. She reports medication compliance. No s/sx of bleeding. SBP 130s at home per patient and home DBP 60s with clinic BP today 148/62. She has a blue bird that she reports has been keeping her company at home. Of note, previous sx before her cath were nausea, which she states today has not recurred.    Home Medications    Prior to Admission medications   Medication Sig Start Date End Date Taking? Authorizing Provider  acetaminophen (TYLENOL) 325 MG tablet Take 2 tablets (650 mg total) by mouth every 4 (four) hours as needed for mild pain or headache. 11/13/19  Yes Angiulli, Mcarthur Rossetti, PA-C  atorvastatin (LIPITOR) 80 MG tablet Take 1 tablet (80 mg total) by mouth daily at 6 PM. 12/11/19  Yes Bensimhon, Bevelyn Buckles, MD  Calcium Carbonate-Vit D-Min (CALCIUM-VITAMIN D-MINERALS) 600-800 MG-UNIT CHEW Chew by mouth.   Yes [provider]  carvedilol (COREG) 12.5 MG tablet Take 1 tablet (12.5 mg total) by mouth 2 (two) times daily with a meal. 12/23/19  Yes Michaelle Birks, Afsa Meany D, PA-C  clopidogrel (PLAVIX) 75 MG tablet Take 1 tablet (75 mg total) by mouth daily with breakfast. 12/11/19  Yes Bensimhon, Bevelyn Buckles, MD  darifenacin (ENABLEX) 15 MG 24 hr tablet Take 1 tablet (15 mg total) by mouth at bedtime. 12/11/19  Yes Bensimhon, Bevelyn Buckles, MD  diclofenac Sodium (VOLTAREN) 1 % GEL Apply 2 g topically daily as needed.   Yes [provider]  docusate sodium (COLACE) 100 MG capsule Take 100 mg by mouth 2 (two) times daily.   Yes [provider]  hydrocortisone cream 1 % Apply 1 application topically 2 (two) times daily as needed for itching.   Yes [provider]  losartan (COZAAR) 100 MG tablet Take 1 tablet (100 mg total) by mouth daily. 12/11/19  Yes Bensimhon, Bevelyn Buckles, MD  magnesium gluconate (MAGONATE) 500 MG tablet Take 1 tablet (500 mg total) by mouth every 6 (six) hours. 11/14/19  Yes Angiulli, Mcarthur Rossetti, PA-C  Melatonin 10 MG CAPS Take 20 mg by mouth daily as needed.    Yes [provider]  metFORMIN (GLUCOPHAGE) 500 MG tablet Take 1 tablet (500 mg total) by mouth daily with breakfast. 12/11/19  Yes Bensimhon, Bevelyn Buckles, MD  Multiple Vitamin (MULTIVITAMIN WITH MINERALS) TABS tablet Take 1 tablet by mouth daily.   Yes [provider]  nitroGLYCERIN (NITROSTAT) 0.4 MG SL tablet Place 1 tablet (0.4 mg total) under the tongue every 5 (five) minutes as needed for chest pain. 11/13/19  Yes Angiulli, Mcarthur Rossetti,  PA-C  pantoprazole (PROTONIX) 40 MG tablet Take 1 tablet (40 mg total) by mouth 2 (two) times daily before a meal. 11/13/19 11/12/20 Yes Angiulli, Mcarthur Rossetti, PA-C  tamsulosin (FLOMAX) 0.4 MG CAPS capsule Take 1 capsule (0.4 mg total) by mouth daily after supper. 12/11/19  Yes Bensimhon, Bevelyn Buckles, MD    Review of Systems    She denies chest pain, palpitations, dyspnea, pnd, orthopnea, n, v, dizziness, syncope, edema, weight gain, or early satiety.   All other systems reviewed and are otherwise negative except as noted above.  Physical Exam    VS:  BP (!) 148/62 (BP Location: Right Arm, Patient Position: Sitting, Cuff Size: Normal)   Pulse 74   Ht 5\' 2"  (1.575 m)   Wt 151 lb 6 oz (68.7 kg)   SpO2 98%   BMI 27.69 kg/m  , BMI Body mass index is 27.69 kg/m. GEN: Well nourished, well developed, in no acute distress. Seated in wheelchair. HEENT: normal. Neck: Supple, no JVD, carotid bruits, or masses. Cardiac: RRR, no murmurs, rubs, or gallops. No clubbing, cyanosis, edema.  Radials/DP/PT 2+ and equal  bilaterally.  Respiratory:  Respirations regular and unlabored, clear to auscultation bilaterally. GI: Soft, nontender, nondistended, BS + x 4. MS: no deformity or atrophy. Skin: warm and dry, no rash. Neuro:  No visualized deficits.  Psych: Normal affect.  Accessory Clinical Findings    ECG personally reviewed by me today - NSR, low voltage EKG, 74bpm, anterolateral TWI as seen in previous EKGs, poor conduction via aVF versus lead placement, poor R wave progression in III, aVF, V1-VIII- no acute changes.  VITALS Reviewed today   Temp Readings from Last 3 Encounters:  12/17/19 98.7 F (37.1 C)  11/19/19 97.7 F (36.5 C)  11/14/19 97.8 F (36.6 C) (Oral)   BP Readings from Last 3 Encounters:  12/23/19 (!) 148/62  12/17/19 (!) 150/77  11/27/19 132/76   Pulse Readings from Last 3 Encounters:  12/23/19 74  12/17/19 64  11/27/19 70    Wt Readings from Last 3 Encounters:  12/23/19 151 lb 6 oz (68.7 kg)  12/17/19 148 lb 12.8 oz (67.5 kg)  11/27/19 179 lb (81.2 kg)     LABS  reviewed today   Lab Results  Component Value Date   WBC 7.1 11/08/2019   HGB 9.8 (L) 11/08/2019   HCT 30.5 (L) 11/08/2019   MCV 95.0 11/08/2019   PLT 215 11/08/2019   Lab Results  Component Value Date   CREATININE 0.83 11/12/2019   BUN 16 11/12/2019   NA 141 11/12/2019   K 3.6 11/12/2019   CL 107 11/12/2019   CO2 23 11/12/2019   Lab Results  Component Value Date   ALT 19 10/17/2019   AST 34 10/17/2019   ALKPHOS 75 10/17/2019   BILITOT 1.1 10/17/2019   Lab Results  Component Value Date   CHOL 99 10/01/2019   HDL 27 (L) 10/01/2019   LDLCALC 37 10/01/2019   TRIG 174 (H) 10/01/2019   CHOLHDL 3.7 10/01/2019    Lab Results  Component Value Date   HGBA1C 5.9 (H) 09/26/2019   Lab Results  Component Value Date   TSH 0.048 (L) 09/28/2019     STUDIES/PROCEDURES reviewed today   Echo 10/02/19 1. Left ventricular ejection fraction, by estimation, is 50 to 55%. The  left ventricle  has low normal function. The left ventricle demonstrates  regional wall motion abnormalities (see scoring diagram/findings for  description). There is mild concentric  left  ventricular hypertrophy. Left ventricular diastolic parameters are  indeterminate. Elevated left ventricular end-diastolic pressure. There is  mild hypokinesis of the left ventricular, entire anteroseptal wall. There  is mild hypokinesis of the left  ventricular, mid-apical anterior wall.  2. Right ventricular systolic function is normal. The right ventricular  size is normal. Tricuspid regurgitation signal is inadequate for assessing  PA pressure.  3. The mitral valve is normal in structure. Trivial mitral valve  regurgitation. No evidence of mitral stenosis.  4. The aortic valve is tricuspid. Aortic valve regurgitation is not  visualized. No aortic stenosis is present.   LHC 09/30/19  Proximal LAD is 90% stenosed  A drug-eluting stent was successfully placed using a STENT RESOLUTE ONYX 3.0X12.  Post intervention, there is a 0% residual stenosis.  Mid Cx lesion is 95% stenosed.  A drug-eluting stent was successfully placed using a STENT RESOLUTE ONYX 2.5X26.  Post intervention, there is a 0% residual stenosis. 1. Successful PCI of the proximal LAD with DES x 1 2. Successful PCI of the mid LCx with DES x 1.  Plan: DAPT for one year. Will continue IV Cangrelor until NG in place and patient can be loaded with oral Plavix.   LHC 09/27/19  The left ventricular ejection fraction is less than 25% by visual estimate.  LV end diastolic pressure is severely elevated.  There is severe left ventricular systolic dysfunction.  RPDA lesion is 90% stenosed.  Ramus lesion is 90% stenosed.  3rd Mrg lesion is 90% stenosed.  Dist LAD-1 lesion is 80% stenosed.  Dist LAD-2 lesion is 50% stenosed.  Prox LAD lesion is 90% stenosed.    Assessment & Plan    Severe 3v CAD s/p PCI / DES to LAD and mLCx --No CP or  angina. Cath 09/2019 as above with DES x2 to LAD and mLCx. Continue Plavix, BB, and high intensity statin. Most recent LDL 37 and at goal. Liver function stable. No ASA given h/o recent GIB. Will check a CBC on the Plavix. Continue PRN SL nitro as needed for CP. Continue PT/OT and increase activity as tolerated. She has not completed cardiac rehab, per patient report. Will reassess at RTC and once she is stronger, given CVA as above.   HFrEF, improved LV function s/p revascularization --No s/sx of worsening heart failure. Euvolemic and well compensated on exam.  Echo EF 20% prior to PCI with most recent echo showing low normal EF 50-55% s/p stenting, mild concentric LVH, elevated LVEDP, mild hypokinesis of the LV and entire anteroseptal /mid apical anterior wall. Continue losartan 100mg  daily. She is not currently on a standing diuretic and without indication to start one today. Reassess volume status at RTC. Will work to get BP under control as directly below.  HTN --BP sub-optimal today. Reports home BP well controlled. Increase Coreg to 12.5mg  BID.  Continue with losartan 100mg  daily.  History of CVA --Suspected cardioembolic --Continue PT/OT   PUD/GIB --S/p clipping of duodenal ulcer 10/2019. Denies s/sx of GIB today. No ASA as above. Will check a CBC on Plavix.  DM2 --Continue metformin.  Medication changes: Increased BB to Coreg 12.5mg  BID. Labs ordered: BMET, CBC. Studies / Imaging ordered: None. Future considerations: Periodic echo. Disposition: RTC 1 month.     Arvil Chaco, PA-C

## 2019-12-27 ENCOUNTER — Encounter: Payer: Self-pay | Admitting: Urology

## 2019-12-27 ENCOUNTER — Other Ambulatory Visit: Payer: Self-pay

## 2019-12-27 ENCOUNTER — Telehealth: Payer: Self-pay | Admitting: *Deleted

## 2019-12-27 ENCOUNTER — Ambulatory Visit (INDEPENDENT_AMBULATORY_CARE_PROVIDER_SITE_OTHER): Payer: Medicare Other | Admitting: Urology

## 2019-12-27 VITALS — BP 145/66 | HR 66 | Ht 62.0 in | Wt 152.5 lb

## 2019-12-27 DIAGNOSIS — R350 Frequency of micturition: Secondary | ICD-10-CM

## 2019-12-27 DIAGNOSIS — R3129 Other microscopic hematuria: Secondary | ICD-10-CM

## 2019-12-27 NOTE — Progress Notes (Signed)
   12/27/19  CC:  Chief Complaint  Patient presents with  . Cysto    HPI:  F/u -   1) urinary frequency - She was recently hospitalized 05/21 with a non-STEMI MI, heart failure and then suffered a right MCA infarct.  She was on tamsulosin and Enablex.  I did not see that she had urinary retention. She had some trouble voiding in hospital but was in bed and had to use a urinal and purewick. Voiding better now but she has some urgency. Noc x 5-6. No incontinence. Some constipation.   PVR normal.    We discussed PT and she was going to discuss with OT staff at her SNF. She continued Enablex.   2) microscopic hematuria - A UA showed 6-10 red blood cells per high-powered field.  Urine culture less than 20,000 strep.  She underwent CT scan of the abdomen and pelvis on 09/26/2019 and 09/28/2019 with benign urinary tract.  There was a comment on delayed nephrograms on the second scan, but there was no hydronephrosis.  Her creatinine was normal at 0.7 - 0.8. She denies gross hematuria.   She returns for cystoscopy. She continues enablex and working with OT. Some bowel and urinary urgency when ambulating but manageable. Ua clear today.  Blood pressure (!) 145/66, pulse 66, height 5\' 2"  (1.575 m), weight 152 lb 8 oz (69.2 kg). NED. A&Ox3.   No respiratory distress   Abd soft, NT, ND Normal external genitalia with patent urethral meatus  Cystoscopy Procedure Note  Patient identification was confirmed, informed consent was obtained, and patient was prepped using Betadine solution.  Lidocaine jelly was administered per urethral meatus.    Procedure: - Flexible cystoscope introduced, without any difficulty.   - Thorough search of the bladder revealed:    normal urethral meatus    normal urothelium    no stones    no ulcers     no tumors    no urethral polyps    no trabeculation  - Ureteral orifices were normal in position and appearance.  Post-Procedure: - Patient tolerated the  procedure well  Assessment/ Plan:  Urgency - stable  MH - benign eval     No follow-ups on file.  , MD

## 2019-12-27 NOTE — Telephone Encounter (Signed)
Will OT requests to move final vist to next week because patient had doctors appot this week.  Approval given.

## 2019-12-27 NOTE — Patient Instructions (Signed)

## 2019-12-30 LAB — MICROSCOPIC EXAMINATION
Bacteria, UA: NONE SEEN
RBC, Urine: NONE SEEN /hpf (ref 0–2)

## 2019-12-30 LAB — URINALYSIS, COMPLETE
Bilirubin, UA: NEGATIVE
Glucose, UA: NEGATIVE
Ketones, UA: NEGATIVE
Leukocytes,UA: NEGATIVE
Nitrite, UA: NEGATIVE
RBC, UA: NEGATIVE
Specific Gravity, UA: 1.02 (ref 1.005–1.030)
Urobilinogen, Ur: 0.2 mg/dL (ref 0.2–1.0)
pH, UA: 6.5 (ref 5.0–7.5)

## 2019-12-31 ENCOUNTER — Ambulatory Visit: Payer: Medicare Other

## 2020-01-01 ENCOUNTER — Telehealth: Payer: Self-pay

## 2020-01-01 NOTE — Telephone Encounter (Signed)
Verbal okay given for Speech Therapy 1:1wk. Patient does have a follow up appointment to see Dr. Allena Katz.   Patient to see PCP next month.

## 2020-01-10 ENCOUNTER — Telehealth: Payer: Self-pay | Admitting: *Deleted

## 2020-01-10 NOTE — Telephone Encounter (Signed)
[facility] called and is requesting POC ST 1wk4 beginning 01/13/20. Approval given.

## 2020-01-13 ENCOUNTER — Ambulatory Visit
Admission: RE | Admit: 2020-01-13 | Discharge: 2020-01-13 | Disposition: A | Discharge status: Home / Self Care | Payer: Medicare Other | Source: Ambulatory Visit | Attending: Physical Medicine & Rehabilitation | Admitting: Physical Medicine & Rehabilitation

## 2020-01-13 ENCOUNTER — Other Ambulatory Visit: Payer: Self-pay

## 2020-01-13 DIAGNOSIS — I6340 Cerebral infarction due to embolism of unspecified cerebral artery: Secondary | ICD-10-CM | POA: Diagnosis present

## 2020-01-13 DIAGNOSIS — I63511 Cerebral infarction due to unspecified occlusion or stenosis of right middle cerebral artery: Secondary | ICD-10-CM | POA: Diagnosis present

## 2020-01-13 DIAGNOSIS — I69391 Dysphagia following cerebral infarction: Secondary | ICD-10-CM | POA: Insufficient documentation

## 2020-01-13 NOTE — Therapy (Signed)
[facility] DIAGNOSTIC RADIOLOGY [address] [place], [place], [address] Phone: [phone]   Fax:     Modified Barium Swallow  Patient Details  Name: Tonique Mendonca MRN: [id number] Date of Birth: [birth year] No data recorded  Encounter Date: 01/13/2020   End of Session - 01/13/20 1324    Visit Number 1    Number of Visits 1    Date for SLP Re-Evaluation 01/13/20           Past Medical History:  Diagnosis Date   CAD (coronary artery disease)    a. 09/2019 NSTEMI/Cath: LM nl, LAD 90p, 29m, diffuse mid-dist dzs, LCX 25m AV groove/OM2, RI 90p, RCA dominant, nl, RPDA 70m, EF 25%, glob HK.   HFrEF (heart failure with reduced ejection fraction) (HCC)    a. 09/2019 LV gram: EF 25%, global HK.   PUD (peptic ulcer disease)    Seasonal allergies     Past Surgical History:  Procedure Laterality Date   CHOLECYSTECTOMY     CORONARY STENT INTERVENTION N/A 09/30/2019   Procedure: CORONARY STENT INTERVENTION;  Surgeon: Swaziland, Peter M, MD;  Location: [facility] INVASIVE CV LAB;  Service: Cardiovascular;  Laterality: N/A;   ESOPHAGOGASTRODUODENOSCOPY (EGD) WITH PROPOFOL N/A 10/08/2019   Procedure: ESOPHAGOGASTRODUODENOSCOPY (EGD) WITH PROPOFOL;  Surgeon: Benancio Deeds, MD;  Location: [facility] ENDOSCOPY;  Service: Gastroenterology;  Laterality: N/A;   HEMOSTASIS CLIP PLACEMENT  10/08/2019   Procedure: HEMOSTASIS CLIP PLACEMENT;  Surgeon: Benancio Deeds, MD;  Location: [facility] ENDOSCOPY;  Service: Gastroenterology;;   HEMOSTASIS CONTROL  10/08/2019   Procedure: HEMOSTASIS CONTROL;  Surgeon: Benancio Deeds, MD;  Location: [facility] ENDOSCOPY;  Service: Gastroenterology;;   LEFT HEART CATH AND CORONARY ANGIOGRAPHY N/A 09/30/2019   Procedure: LEFT HEART CATH AND CORONARY ANGIOGRAPHY;  Surgeon: Swaziland, Peter M, MD;  Location: [facility] INVASIVE CV LAB;  Service: Cardiovascular;  Laterality: N/A;   LEFT HEART CATH AND CORS/GRAFTS ANGIOGRAPHY N/A 09/27/2019   Procedure:  LEFT HEART CATH AND CORS/GRAFTS ANGIOGRAPHY;  Surgeon: Antonieta Iba, MD;  Location: [facility] INVASIVE CV LAB;  Service: Cardiovascular;  Laterality: N/A;    There were no vitals filed for this visit.   Subjective: Patient behavior: (alertness, ability to follow instructions, etc.):  The patient is alert, hyper-verbal, and able to follow directions.  Chief complaint: 69 year old woman S/P right MCA CVA 09/30/2019.  Her last MBS was 10/29/2019 showing deep laryngeal penetration and trace aspiration of thin liquids when not using left head turn strategy.  She was released from inpatient rehab on dysphagia 3 diet with thin liquids using left head turn.  The patient is very talkative and may be at risk for talking while eating.   Objective:  Radiological Procedure: A videoflouroscopic evaluation of oral-preparatory, reflex initiation, and pharyngeal phases of the swallow was performed; as well as a screening of the upper esophageal phase.  I. POSTURE: Upright in MBS chair  II. VIEW: Lateral  III. COMPENSATORY STRATEGIES: N/A  IV. BOLUSES ADMINISTERED:   Thin Liquid: 1 small, 3 rapid consecutive   Nectar-thick Liquid: 1 moderate   Honey-thick Liquid: DNT   Puree: 1 teaspoon, 1 heaping teaspoon    Mechanical Soft: 1/4 graham cracker in applesauce  V. RESULTS OF EVALUATION: A. ORAL PREPARATORY PHASE: (The lips, tongue, and velum are observed for strength and coordination)       **Overall Severity Rating: within normal limits  B. SWALLOW INITIATION/REFLEX: (The reflex is normal if triggered by the time the bolus reached the base  of the tongue)  **Overall Severity Rating: Mild; triggers while falling from the valleculae to the pyriform sinuses  C. PHARYNGEAL PHASE: (Pharyngeal function is normal if the bolus shows rapid, smooth, and continuous transit through the pharynx and there is no pharyngeal residue after the swallow)  **Overall Severity Rating: within normal  limits  D. LARYNGEAL PENETRATION: (Material entering into the laryngeal inlet/vestibule but not aspirated) Transient with thin liquid   E. ASPIRATION: None  F. ESOPHAGEAL PHASE: (Screening of the upper esophagus) No observed abnormality within the viewable cervical esophagus  ASSESSMENT: This 69 year old woman; S/P CVA 09/30/2019; is presenting with minimal oropharyngeal dysphagia characterized by delayed pharyngeal swallow initiation and transient laryngeal penetration.  With exception of excessive talking, oral control of the bolus including oral hold, rotary mastication, and anterior to posterior transfer is within functional limits.   Aspects of the pharyngeal stage of swallowing including tongue base retraction, hyolaryngeal excursion, epiglottic inversion, and duration/amplitude of UES opening are within normal limits.  There is no observed pharyngeal residue or tracheal aspiration without use of left head turn.  The patient is not at risk for prandial aspiration.  The patient had one coughing episode during the study that did not appear to be elicited by aspiration as she had completed 3 rapid consecutive swallows before coughing.  The patient reports nocturnal coughing as well as coughing after swallowing.  This is suggestive of a heightened cough reflex sensitivity; she may benefit from evaluation for chronic cough.  PLAN/RECOMMENDATIONS:   A. Diet: Regular (after SLP clinical assessment to insure excessive talking does not interfere with safe swallowing)   B. Swallowing Precautions: Attend to swallowing   C. Recommended consultation to: evaluation for chronic cough   D. Therapy recommendations: assess for safety of regular diet   E. Results and recommendations were discussed with the patient immediately following the study and the final report routed to the referring MD.   Patient will benefit from skilled therapeutic intervention in order to improve the following deficits and  impairments:   Dysphagia, post-stroke - Plan: DG SWALLOW FUNC OP MEDICARE SPEECH PATH, DG SWALLOW FUNC OP MEDICARE SPEECH PATH  Right middle cerebral artery stroke (HCC) - Plan: DG SWALLOW FUNC OP MEDICARE SPEECH PATH, DG SWALLOW FUNC OP MEDICARE SPEECH PATH  Cerebral infarction due to embolism of cerebral artery (HCC) - Plan: DG SWALLOW FUNC OP MEDICARE SPEECH PATH, DG SWALLOW FUNC OP MEDICARE SPEECH PATH        Problem List Patient Active Problem List   Diagnosis Date Noted   Osteoarthritis of left shoulder    Chronic left shoulder pain    Neurogenic bladder    Acute lower UTI    Urinary frequency    Hypoalbuminemia due to protein-calorie malnutrition (HCC)    Hypokalemia    Hypomagnesemia    Labile blood pressure    Labile blood glucose    Acute systolic congestive heart failure (HCC)    Essential hypertension    Keratoconjunctivitis    Right middle cerebral artery stroke (HCC) 10/16/2019   Embolic infarction (HCC) 10/16/2019   Prediabetes    Benign essential HTN    Dysphagia, post-stroke    Duodenal ulcer    Acute blood loss anemia 10/08/2019   Upper GI bleed    Status post coronary artery stent placement    Malnutrition of moderate degree 10/02/2019   Cerebral embolism with cerebral infarction 10/01/2019   CAD (coronary artery disease) 09/27/2019   Acute systolic CHF (congestive heart failure) (HCC) 09/27/2019  NSTEMI (non-ST elevated myocardial infarction) (HCC) 09/26/2019   Dollene Primrose, MS/CCC- SLP  Leandrew Koyanagi 01/13/2020, 1:25 PM  [facility] DIAGNOSTIC RADIOLOGY [address] [place], [place], [address] Phone: [phone]   Fax:     Name: Kalany Diekmann MRN: [id number] Date of Birth: April 12, 1951

## 2020-01-22 ENCOUNTER — Encounter: Payer: Self-pay | Admitting: Physician Assistant

## 2020-01-22 ENCOUNTER — Ambulatory Visit (INDEPENDENT_AMBULATORY_CARE_PROVIDER_SITE_OTHER): Payer: Medicare Other | Admitting: Physician Assistant

## 2020-01-22 VITALS — BP 140/60 | HR 64 | Ht 62.0 in | Wt 162.0 lb

## 2020-01-22 DIAGNOSIS — K269 Duodenal ulcer, unspecified as acute or chronic, without hemorrhage or perforation: Secondary | ICD-10-CM | POA: Diagnosis not present

## 2020-01-22 DIAGNOSIS — D62 Acute posthemorrhagic anemia: Secondary | ICD-10-CM

## 2020-01-22 DIAGNOSIS — I749 Embolism and thrombosis of unspecified artery: Secondary | ICD-10-CM | POA: Diagnosis not present

## 2020-01-22 NOTE — Progress Notes (Signed)
Chief Complaint: Follow-up hospitalization for upper GI bleed  HPI:    Brittney Kennedy is a 68 year old female with a past medical history as listed below, assigned to Dr. Havery Moros during recent hospitalization, who presents to clinic today for follow-up after an upper GI bleed.    10/08/2019 patient seen in the hospital by her service for melena and acute on chronic anemia.  At that time and had a recent NSTEMI with cath 09/27/2019.  She was started on Plavix after drug-eluting stent placement.  Also had an acute right basal ganglia CVA 3/30.  EGD was pursued with the patient on her anticoagulation.    10/08/2019 EGD with red and clotted blood in the fundus and body of the stomach.  Oozing duodenal ulcer with visible vessel treated with epinephrine and 5 clips.    10/11/2019 hemoglobin was noted to fluctuate over the past days but stable in the past 24 hours and BUN is trending down.  Plavix was continued given recent coronary stenting.  H. pylori IgG serology was negative.  At that time her IV Protonix was stopped and she was changed to 40 mg p.o. twice daily.    Patient had another stroke and remained in the hospital for some time.    12/23/2019 BMP normal, CBC normal with a hemoglobin of 13.2.    Today, the patient presents to clinic accompanied by her son who is an Therapist, sports down in [place] and they explain that she has been doing well.  Patient tells me that she has had a duodenal ulcer forever and "it has never been really treated".  In the past she has tried to avoid certain foods which caused problems but she has started to add these back in including peanut butter.  She continues her Pantoprazole 40 mg twice a day and is having normal bowel movements with no abdominal pain, nausea or vomiting.    Denies fever chills.     Past Medical History:  Diagnosis Date  . CAD (coronary artery disease)    a. 09/2019 NSTEMI/Cath: LM nl, LAD 90p, 81m diffuse mid-dist dzs, LCX 970mV groove/OM2, RI 90p, RCA dominant,  nl, RPDA 9033mF 25%, glob HK.  . HMarland KitchenrEF (heart failure with reduced ejection fraction) [place]  a. 09/2019 LV gram: EF 25%, global HK.  . PMarland KitchenD (peptic ulcer disease)   . Seasonal allergies     Past Surgical History:  Procedure Laterality Date  . CHOLECYSTECTOMY    . CORONARY STENT INTERVENTION N/A 09/30/2019   Procedure: CORONARY STENT INTERVENTION;  Surgeon: JorMartiniqueeter M, MD;  Location: [facility];  Service: Cardiovascular;  Laterality: N/A;  . ESOPHAGOGASTRODUODENOSCOPY (EGD) WITH PROPOFOL N/A 10/08/2019   Procedure: ESOPHAGOGASTRODUODENOSCOPY (EGD) WITH PROPOFOL;  Surgeon: ArmYetta FlockD;  [facility];  Laterality: N/A;  . HEMOSTASIS CLIP PLACEMENT  10/08/2019   Procedure: HEMOSTASIS CLIP PLACEMENT;  Surgeon: ArmYetta FlockD;  [facility];;  . HEMOSTASIS CONTROL  10/08/2019   Procedure: HEMOSTASIS CONTROL;  Surgeon: ArmYetta FlockD;  [facility];;  . LEFT HEART CATH AND CORONARY ANGIOGRAPHY N/A 09/30/2019   Procedure: LEFT HEART CATH AND CORONARY ANGIOGRAPHY;  Surgeon: JorMartiniqueeter M, MD;  [facility];  Service: Cardiovascular;  Laterality: N/A;  . LEFT HEART CATH AND CORS/GRAFTS ANGIOGRAPHY N/A 09/27/2019   Procedure: LEFT HEART CATH AND CORS/GRAFTS ANGIOGRAPHY;  Surgeon: GolMinna MerrittsD;  [facility];  Service: Cardiovascular;  Laterality: N/A;    Current Outpatient Medications  Medication Sig Dispense Refill  . acetaminophen (TYLENOL) 325 MG tablet Take 2 tablets (650 mg total) by mouth every 4 (four) hours as needed for mild pain or headache.    Marland Kitchen atorvastatin (LIPITOR) 80 MG tablet Take 1 tablet (80 mg total) by mouth daily at 6 PM. 30 tablet 3  . Calcium Carbonate-Vit D-Min (CALCIUM-VITAMIN D-MINERALS) 600-800 MG-UNIT CHEW Chew by mouth.    . carvedilol (COREG) 12.5 MG tablet Take 1 tablet  (12.5 mg total) by mouth 2 (two) times daily with a meal. 60 tablet 3  . clopidogrel (PLAVIX) 75 MG tablet Take 1 tablet (75 mg total) by mouth daily with breakfast. 30 tablet 3  . darifenacin (ENABLEX) 15 MG 24 hr tablet Take 1 tablet (15 mg total) by mouth at bedtime. 30 tablet 2  . diclofenac Sodium (VOLTAREN) 1 % GEL Apply 2 g topically daily as needed.    . docusate sodium (COLACE) 100 MG capsule Take 100 mg by mouth 2 (two) times daily.    . hydrocortisone cream 1 % Apply 1 application topically 2 (two) times daily as needed for itching.    . losartan (COZAAR) 100 MG tablet Take 1 tablet (100 mg total) by mouth daily. 30 tablet 3  . magnesium gluconate (MAGONATE) 500 MG tablet Take 1 tablet (500 mg total) by mouth every 6 (six) hours. 240 tablet 0  . Melatonin 10 MG CAPS Take 20 mg by mouth daily as needed.     . metFORMIN (GLUCOPHAGE) 500 MG tablet Take 1 tablet (500 mg total) by mouth daily with breakfast. 30 tablet 3  . Multiple Vitamin (MULTIVITAMIN WITH MINERALS) TABS tablet Take 1 tablet by mouth daily.    . nitroGLYCERIN (NITROSTAT) 0.4 MG SL tablet Place 1 tablet (0.4 mg total) under the tongue every 5 (five) minutes as needed for chest pain. 30 tablet 12  . pantoprazole (PROTONIX) 40 MG tablet Take 1 tablet (40 mg total) by mouth 2 (two) times daily before a meal. 60 tablet 11  . tamsulosin (FLOMAX) 0.4 MG CAPS capsule Take 1 capsule (0.4 mg total) by mouth daily after supper. 30 capsule 2   No current facility-administered medications for this visit.    Allergies as of 01/22/2020 - Review Complete 12/27/2019  Allergen Reaction Noted  . Onion Anaphylaxis and Hives 10/09/2019  . Monosodium glutamate Nausea Only 10/09/2019    Family History  Problem Relation Age of Onset  . Stroke Mother        died @ 58  . Multiple myeloma Father        died @ 69  . Multiple myeloma Sister     Social History   Socioeconomic History  . Marital status: Divorced    Spouse name: Not on  file  . Number of children: Not on file  . Years of education: Not on file  . Highest education level: Not on file  Occupational History  . Not on file  Tobacco Use  . Smoking status: Never Smoker  . Smokeless tobacco: Never Used  Substance and Sexual Activity  . Alcohol use: Never  . Drug use: Never  . Sexual activity: Not Currently  Other Topics Concern  . Not on file  Social History Narrative  . Not on file   Social Determinants of Health   Financial Resource Strain:   . Difficulty of Paying Living Expenses:   Food Insecurity:   .  Worried About Charity fundraiser in the Last Year:   . Arboriculturist in the Last Year:   Transportation Needs:   . Film/video editor (Medical):   Marland Kitchen Lack of Transportation (Non-Medical):   Physical Activity:   . Days of Exercise per Week:   . Minutes of Exercise per Session:   Stress:   . Feeling of Stress :   Social Connections:   . Frequency of Communication with Friends and Family:   . Frequency of Social Gatherings with Friends and Family:   . Attends Religious Services:   . Active Member of Clubs or Organizations:   . Attends Archivist Meetings:   Marland Kitchen Marital Status:   Intimate Partner Violence:   . Fear of Current or Ex-Partner:   . Emotionally Abused:   Marland Kitchen Physically Abused:   . Sexually Abused:     Review of Systems:    Constitutional: No weight loss, fever or chills Cardiovascular: No chest pain Respiratory: No SOB Gastrointestinal: See HPI and otherwise negative   Physical Exam:  Vital signs: BP 140/60   Pulse 64   Ht 5' 2"  (1.575 m)   Wt 162 lb (73.5 kg)   BMI 29.63 kg/m   Constitutional:   Pleasant elderly Caucasian female appears to be in NAD, Well developed, Well nourished, alert and cooperative Respiratory: Respirations even and unlabored. Lungs clear to auscultation bilaterally.   No wheezes, crackles, or rhonchi.  Cardiovascular: Normal S1, S2. No MRG. Regular rate and rhythm. No peripheral  edema, cyanosis or pallor.  Gastrointestinal:  Soft, nondistended, nontender. No rebound or guarding. Normal bowel sounds. No appreciable masses or hepatomegaly. Rectal:  Not performed.  Psychiatric: Demonstrates good judgement and reason without abnormal affect or behaviors.  RELEVANT LABS AND IMAGING: CBC    Component Value Date/Time   WBC 7.7 12/23/2019 1211   WBC 7.1 11/08/2019 0647   RBC 4.55 12/23/2019 1211   RBC 3.21 (L) 11/08/2019 0647   HGB 13.2 12/23/2019 1211   HCT 39.6 12/23/2019 1211   PLT 234 12/23/2019 1211   MCV 87 12/23/2019 1211   MCH 29.0 12/23/2019 1211   MCH 30.5 11/08/2019 0647   MCHC 33.3 12/23/2019 1211   MCHC 32.1 11/08/2019 0647   RDW 13.0 12/23/2019 1211   LYMPHSABS 2.4 11/08/2019 0647   MONOABS 0.9 11/08/2019 0647   EOSABS 0.2 11/08/2019 0647   BASOSABS 0.0 11/08/2019 0647    CMP     Component Value Date/Time   NA 141 12/23/2019 1211   K 4.6 12/23/2019 1211   CL 103 12/23/2019 1211   CO2 22 12/23/2019 1211   GLUCOSE 122 (H) 12/23/2019 1211   GLUCOSE 153 (H) 11/12/2019 0509   BUN 15 12/23/2019 1211   CREATININE 0.78 12/23/2019 1211   CALCIUM 10.6 (H) 12/23/2019 1211   PROT 5.4 (L) 10/17/2019 0516   ALBUMIN 2.5 (L) 10/17/2019 0516   AST 34 10/17/2019 0516   ALT 19 10/17/2019 0516   ALKPHOS 75 10/17/2019 0516   BILITOT 1.1 10/17/2019 0516   GFRNONAA 78 12/23/2019 1211   GFRAA 90 12/23/2019 1211    Assessment: 1.  Duodenal ulcer: Seen at time of EGD in the hospital in April, no further symptoms per patient 2.  Acute blood loss anemia: Resolved  Plan: 1.  Discussed that the patient can decrease her Pantoprazole down to 40 mg once daily, 30-60 minutes before breakfast.  Patient son tells me she has enough of this medicine at home.  2.  It is okay for her to reintroduce foods such as tuna salad, peanut butter and oatmeal into her diet as long as these do not bother her.  Did discuss that she should completely avoid NSAIDs. 3.  Patient son  asks where she should follow-up in the future for GI issues because she lives in [place] it is somewhat difficult for her to get to our clinic.  Explained that I not feel she needs to establish care with a different GI office right now, but they are welcome to do so if they would like. 4.  Discussed that if the patient sees any further melena or starts with nausea, vomiting or abdominal pain then she needs to call and let us know and or proceed to the ER. 5.  Patient to follow in clinic as needed in the future with Dr. Havery Moros or myself.  Ellouise Newer, PA-C [facility] 01/22/2020, 10:38 AM

## 2020-01-22 NOTE — Patient Instructions (Addendum)
If you are age 68 or older, your body mass index should be between 23-30. Your Body mass index is 29.63 kg/m. If this is out of the aforementioned range listed, please consider follow up with your Primary Care Provider.  If you are age 55 or younger, your body mass index should be between 19-25. Your Body mass index is 29.63 kg/m. If this is out of the aformentioned range listed, please consider follow up with your Primary Care Provider.   Decrease Pantoprazole 40 mg to once daily 30-60 minutes before breakfast.   Follow up as needed.

## 2020-01-23 NOTE — Progress Notes (Signed)
Agree with assessment and plan as outlined.  

## 2020-01-30 ENCOUNTER — Other Ambulatory Visit: Payer: Self-pay

## 2020-01-30 ENCOUNTER — Encounter: Payer: Medicare Other | Attending: Physical Medicine & Rehabilitation | Admitting: Physical Medicine & Rehabilitation

## 2020-01-30 ENCOUNTER — Encounter: Payer: Self-pay | Admitting: Physical Medicine & Rehabilitation

## 2020-01-30 VITALS — BP 130/64 | Ht 62.0 in

## 2020-01-30 DIAGNOSIS — I10 Essential (primary) hypertension: Secondary | ICD-10-CM | POA: Insufficient documentation

## 2020-01-30 DIAGNOSIS — I63511 Cerebral infarction due to unspecified occlusion or stenosis of right middle cerebral artery: Secondary | ICD-10-CM | POA: Diagnosis not present

## 2020-01-30 DIAGNOSIS — K922 Gastrointestinal hemorrhage, unspecified: Secondary | ICD-10-CM | POA: Insufficient documentation

## 2020-01-30 DIAGNOSIS — I6340 Cerebral infarction due to embolism of unspecified cerebral artery: Secondary | ICD-10-CM | POA: Diagnosis not present

## 2020-01-30 DIAGNOSIS — R7303 Prediabetes: Secondary | ICD-10-CM | POA: Insufficient documentation

## 2020-01-30 DIAGNOSIS — I5021 Acute systolic (congestive) heart failure: Secondary | ICD-10-CM | POA: Insufficient documentation

## 2020-01-30 DIAGNOSIS — I749 Embolism and thrombosis of unspecified artery: Secondary | ICD-10-CM

## 2020-01-30 DIAGNOSIS — I69391 Dysphagia following cerebral infarction: Secondary | ICD-10-CM | POA: Insufficient documentation

## 2020-01-30 DIAGNOSIS — R350 Frequency of micturition: Secondary | ICD-10-CM | POA: Insufficient documentation

## 2020-01-30 DIAGNOSIS — N319 Neuromuscular dysfunction of bladder, unspecified: Secondary | ICD-10-CM | POA: Insufficient documentation

## 2020-01-30 NOTE — Progress Notes (Signed)
Subjective:    Patient ID: Brittney Kennedy, female    DOB: [birth year], 69 y.o.   MRN: [id number]  TELEHEALTH NOTE  Due to national recommendations of social distancing due to COVID 19, an audio/video telehealth visit is felt to be most appropriate for this patient at this time.  See Chart message from today for the patient's consent to telehealth from [facility].     I verified that I am speaking with the correct person using two identifiers.  Location of patient: Home Location of provider: Office Method of communication: My Chart Video visit Names of participants : Zorita Pang scheduling, [place] Scrape obtaining consent and vitals if available Established patient  HPI Right-handed female with unremarkable past medical history present for follow up for right MCA infarction along with a small left CR and splenium infarct embolic pattern likely due to transient cardiomyopathy with low EF after cardiac catheterization for non-STEMI with stenting complicated by groin hematoma.  Last clinic on 12/17/19.  Son supplements history via conference call. Since that time, pt states she completed therapies. She is follow up with Cards. She still has not seen Neurology. She is not using Voltaren gel because she does not want her bird to get it on her toes.  She states she cannot wear short sleeves because her bird climbs into her clothes.  She passes her swallow and is eating regular diet without difficulty. She saw GI and Protonix is now 1/day.  She continues to have urinary frequency.  She sees Ophtho next month. Had 1 fall trying to pick something up.   Pain Inventory Average Pain 4 Pain Right Now 3 My pain is intermittent and aching  In the last 24 hours, has pain interfered with the following? General activity 0 Relation with others 0 Enjoyment of life 0 What TIME of day is your pain at its worst? Anytime. Sleep (in general) Fair  Pain is worse with: some  activites and high humidity. Pain improves with: medication and Tylenol Relief from Meds: 1  Mobility walk with assistance use a walker how many minutes can you walk? 5 ability to climb steps?  yes do you drive?  no use a wheelchair needs help with transfers Do you have any goals in this area?  yes  Function retired I need assistance with the following:  meal prep, household duties and shopping  Neuro/Psych bladder control problems bowel control problems weakness trouble walking anxiety  Prior Studies Any changes since last visit?  no  Physicians involved in your care Any changes since last visit?  yes Dr. Jiles Garter PCP   Family History  Problem Relation Age of Onset  . Stroke Mother        died @ 35  . Multiple myeloma Father        died @ 41  . Multiple myeloma Sister    Social History   Socioeconomic History  . Marital status: Divorced    Spouse name: Not on file  . Number of children: 1  . Years of education: Not on file  . Highest education level: Not on file  Occupational History  . Not on file  Tobacco Use  . Smoking status: Never Smoker  . Smokeless tobacco: Never Used  Vaping Use  . Vaping Use: Never used  Substance and Sexual Activity  . Alcohol use: Never  . Drug use: Never  . Sexual activity: Not Currently  Other Topics Concern  . Not on file  Social History Narrative  . Not on file   Social Determinants of Health   Financial Resource Strain:   . Difficulty of Paying Living Expenses:   Food Insecurity:   . Worried About Charity fundraiser in the Last Year:   . Arboriculturist in the Last Year:   Transportation Needs:   . Film/video editor (Medical):   Marland Kitchen Lack of Transportation (Non-Medical):   Physical Activity:   . Days of Exercise per Week:   . Minutes of Exercise per Session:   Stress:   . Feeling of Stress :   Social Connections:   . Frequency of Communication with Friends and Family:   . Frequency of  Social Gatherings with Friends and Family:   . Attends Religious Services:   . Active Member of Clubs or Organizations:   . Attends Archivist Meetings:   Marland Kitchen Marital Status:    Past Surgical History:  Procedure Laterality Date  . CHOLECYSTECTOMY    . CORONARY STENT INTERVENTION N/A 09/30/2019   Procedure: CORONARY STENT INTERVENTION;  Surgeon: Martinique, Peter M, MD;  Location: [facility];  Service: Cardiovascular;  Laterality: N/A;  . ESOPHAGOGASTRODUODENOSCOPY (EGD) WITH PROPOFOL N/A 10/08/2019   Procedure: ESOPHAGOGASTRODUODENOSCOPY (EGD) WITH PROPOFOL;  Surgeon: Yetta Flock, MD;  Location: [facility];  Service: Gastroenterology;  Laterality: N/A;  . HEMOSTASIS CLIP PLACEMENT  10/08/2019   Procedure: HEMOSTASIS CLIP PLACEMENT;  Surgeon: Yetta Flock, MD;  Location: [facility];  Service: Gastroenterology;;  . HEMOSTASIS CONTROL  10/08/2019   Procedure: HEMOSTASIS CONTROL;  Surgeon: Yetta Flock, MD;  Location: [facility];  Service: Gastroenterology;;  . LEFT HEART CATH AND CORONARY ANGIOGRAPHY N/A 09/30/2019   Procedure: LEFT HEART CATH AND CORONARY ANGIOGRAPHY;  Surgeon: Martinique, Peter M, MD;  Location: [facility];  Service: Cardiovascular;  Laterality: N/A;  . LEFT HEART CATH AND CORS/GRAFTS ANGIOGRAPHY N/A 09/27/2019   Procedure: LEFT HEART CATH AND CORS/GRAFTS ANGIOGRAPHY;  Surgeon: Minna Merritts, MD;  Location: [facility];  Service: Cardiovascular;  Laterality: N/A;   Past Medical History:  Diagnosis Date  . CAD (coronary artery disease)    a. 09/2019 NSTEMI/Cath: LM nl, LAD 90p, 82m diffuse mid-dist dzs, LCX 94mV groove/OM2, RI 90p, RCA dominant, nl, RPDA 9083mF 25%, glob HK.  . HMarland KitchenrEF (heart failure with reduced ejection fraction) [place]  a. 09/2019 LV gram: EF 25%, global HK.  . PMarland KitchenD (peptic ulcer disease)   . Seasonal allergies    BP (!) 130/64 Comment: Per patient video visit  Ht _0  (1.575 m)   BMI 29.63 kg/m     Opioid Risk Score:   Fall Risk Score:  `1  Depression screen PHQ 2/9  Depression screen PHQ[facility] System9 01/30/2020 11/19/2019  Decreased Interest 0 0  Down, Depressed, Hopeless 0 1  PHQ - 2 Score 0 1  Altered sleeping - 2  Tired, decreased energy - 2  Change in appetite - 1  Feeling bad or failure about yourself  - 0  Trouble concentrating - 2  Moving slowly or fidgety/restless - 2  Suicidal thoughts - 0  PHQ-9 Score - 10  Difficult doing work/chores - Somewhat difficult    Review of Systems  Constitutional: Negative.   HENT: Negative.   Eyes: Negative.   Respiratory: Negative.   Cardiovascular: Negative.   Gastrointestinal: Negative.   Endocrine:       High blood sugar  Genitourinary: Positive for  difficulty urinating and urgency.  Musculoskeletal: Positive for gait problem.  Skin: Negative.   Neurological: Positive for weakness.  Psychiatric/Behavioral: The patient is not nervous/anxious.   All other systems reviewed and are negative.      Objective:   Physical Exam Constitutional: NAD. Vital signs reviewed. HENT: Normocephalic.  Atraumatic. Eyes: EOMI. No discharge. Cardiovascular: No JVD. Respiratory: Normal effort.  No stridor. Psych: Slightly tangential. Neuro: Alert     Assessment & Plan:  Right-handed female with unremarkable past medical history present for follow up for right MCA infarction along with a small left CR and splenium infarct embolic pattern likely due to transient cardiomyopathy with low EF after cardiac catheterization for non-STEMI with stenting complicated by groin hematoma.  1. Altered mental status left hemiparesis secondary to right MCA infarction along with a small left CR and splenium infarct embolic pattern likely due to transient cardiomyopathy with low EF after cardiac catheterization for non-STEMI with stenting complicated by groin hematoma             Completed therapies, continue HEP             Continue follow up with heart  failure             Follow up with Neuro, needs appointment, reminded again- Can see Dr. Manuella Ghazi, Neurology, in [place]   2. Pain Management of left shoulder:              Cont tylenol as needed             Encouraged use of Voltaren gel, however, she is worried it will get on her bird's feet   3.  GI bleed/duodenal ulcer.               Status post EGD 10/08/2019 with epi and hemostasis clips x5.  PPI, now daily             Continue to follow up with GI   4.  Post stroke dysphagia: Resolved              Now on regular thins  5.  Keratoconjunctivitis.               Ophthalmology appointment next month  6. Hypomagnesemia             Continue supplements             Labs on 5/18 with magnesium 1.6  Patient to have labs drawn again, encourage follow up to ensure Mag level also   7.  Urinary frequency/Neurogenic bladder             Follow-up with urology             Continue Enablex             Cont Flomax started on 5/5

## 2020-01-31 ENCOUNTER — Encounter: Payer: Self-pay | Admitting: Physician Assistant

## 2020-01-31 ENCOUNTER — Ambulatory Visit (INDEPENDENT_AMBULATORY_CARE_PROVIDER_SITE_OTHER): Payer: Medicare Other | Admitting: Physician Assistant

## 2020-01-31 ENCOUNTER — Other Ambulatory Visit: Payer: Self-pay

## 2020-01-31 VITALS — BP 136/72 | HR 67 | Ht 62.0 in | Wt 163.0 lb

## 2020-01-31 DIAGNOSIS — I10 Essential (primary) hypertension: Secondary | ICD-10-CM | POA: Diagnosis not present

## 2020-01-31 DIAGNOSIS — I5022 Chronic systolic (congestive) heart failure: Secondary | ICD-10-CM | POA: Diagnosis not present

## 2020-01-31 DIAGNOSIS — Z8719 Personal history of other diseases of the digestive system: Secondary | ICD-10-CM

## 2020-01-31 DIAGNOSIS — Z8673 Personal history of transient ischemic attack (TIA), and cerebral infarction without residual deficits: Secondary | ICD-10-CM | POA: Diagnosis not present

## 2020-01-31 DIAGNOSIS — I2510 Atherosclerotic heart disease of native coronary artery without angina pectoris: Secondary | ICD-10-CM | POA: Diagnosis not present

## 2020-01-31 DIAGNOSIS — Z955 Presence of coronary angioplasty implant and graft: Secondary | ICD-10-CM

## 2020-01-31 MED ORDER — CARVEDILOL 25 MG PO TABS: 25.0000 mg | ORAL_TABLET | Freq: Two times a day (BID) | ORAL | 1 refills | Status: AC

## 2020-01-31 NOTE — Patient Instructions (Signed)
Medication Instructions:  - Your physician has recommended you make the following change in your medication:   1) INCREASE coreg (carvedilol) to 25 mg- take 1 tablet by mouth twice daily   *If you need a refill on your cardiac medications before your next appointment, please call your pharmacy*   Lab Work: - Your physician recommends that you have lab work today: BMP  If you have labs (blood work) drawn today and your tests are completely normal, you will receive your results only by: Marland Kitchen MyChart Message (if you have MyChart) OR . A paper copy in the mail If you have any lab test that is abnormal or we need to change your treatment, we will call you to review the results.   Testing/Procedures: - none ordered   Follow-Up: At [facility], you and your health needs are our priority.  As part of our continuing mission to provide you with exceptional heart care, we have created designated Provider Care Teams.  These Care Teams include your primary Cardiologist (physician) and Advanced Practice Providers (APPs -  Physician Assistants and Nurse Practitioners) who all work together to provide you with the care you need, when you need it.  We recommend signing up for the patient portal called "MyChart".  Sign up information is provided on this After Visit Summary.  MyChart is used to connect with patients for Virtual Visits (Telemedicine).  Patients are able to view lab/test results, encounter notes, upcoming appointments, etc.  Non-urgent messages can be sent to your provider as well.   To learn more about what you can do with MyChart, go to [email or web address].    Your next appointment:   2-3 months   The format for your next appointment:   In Person  Provider:    You may see Julien Nordmann, MD or one of the following Advanced Practice Providers on your designated Care Team:    Nicolasa Ducking, NP  Eula Listen, PA-C  Marisue Ivan, PA-C    Other Instructions n/a

## 2020-01-31 NOTE — Progress Notes (Signed)
Office Visit    Patient Name: Brittney Kennedy Date of Encounter: 01/31/2020  Primary Care Provider:  Patient, No Pcp Per Primary Cardiologist:  Brittney Nordmann, MD  Chief Complaint    No chief complaint on file.   69 yo female with PMH of CAD, HFrEF (EF 20%), obesity, seasonal allergies, and seen today for follow-up.  Past Medical History    Past Medical History:  Diagnosis Date  . CAD (coronary artery disease)    a. 09/2019 NSTEMI/Cath: LM nl, LAD 90p, 24m, diffuse mid-dist dzs, LCX 8m AV groove/OM2, RI 90p, RCA dominant, nl, RPDA 2m, EF 25%, glob HK.  Marland Kitchen HFrEF (heart failure with reduced ejection fraction) (HCC)    a. 09/2019 LV gram: EF 25%, global HK.  Marland Kitchen PUD (peptic ulcer disease)   . Seasonal allergies    Past Surgical History:  Procedure Laterality Date  . CHOLECYSTECTOMY    . CORONARY STENT INTERVENTION N/A 09/30/2019   Procedure: CORONARY STENT INTERVENTION;  Surgeon: Kennedy, Brittney M, MD;  Location: [facility] INVASIVE CV LAB;  Service: Cardiovascular;  Laterality: N/A;  . ESOPHAGOGASTRODUODENOSCOPY (EGD) WITH PROPOFOL N/A 10/08/2019   Procedure: ESOPHAGOGASTRODUODENOSCOPY (EGD) WITH PROPOFOL;  Surgeon: Brittney Deeds, MD;  Location: [facility] ENDOSCOPY;  Service: Gastroenterology;  Laterality: N/A;  . HEMOSTASIS CLIP PLACEMENT  10/08/2019   Procedure: HEMOSTASIS CLIP PLACEMENT;  Surgeon: Brittney Deeds, MD;  Location: [facility] ENDOSCOPY;  Service: Gastroenterology;;  . HEMOSTASIS CONTROL  10/08/2019   Procedure: HEMOSTASIS CONTROL;  Surgeon: Brittney Deeds, MD;  Location: [facility] ENDOSCOPY;  Service: Gastroenterology;;  . LEFT HEART CATH AND CORONARY ANGIOGRAPHY N/A 09/30/2019   Procedure: LEFT HEART CATH AND CORONARY ANGIOGRAPHY;  Surgeon: Kennedy, Brittney M, MD;  Location: [facility] INVASIVE CV LAB;  Service: Cardiovascular;  Laterality: N/A;  . LEFT HEART CATH AND CORS/GRAFTS ANGIOGRAPHY N/A 09/27/2019   Procedure: LEFT HEART CATH AND CORS/GRAFTS ANGIOGRAPHY;  Surgeon: Brittney Iba, MD;   Location: [facility] INVASIVE CV LAB;  Service: Cardiovascular;  Laterality: N/A;    Allergies  Allergies  Allergen Reactions  . Onion Anaphylaxis and Hives  . Monosodium Glutamate Nausea Only    Headache Fatigue    History of Present Illness    Brittney Kennedy is a 69 y.o. female with PMH as above.   She presented to [facility] 09/22/2019 with progressive heart failure symptoms.  Echo showed EF 20%, mild MR, and aortic valve sclerosis without stenosis.  Cardiac cath 3/26 showed severe multivessel CAD and LV dysfunction (EF 25%).  LHC showed pLAD 90% stenosed with severe diffuse disease throughout (dLAD1 90%s, dLAD2, 50%s), OM3 90%, ramus lesion 90%, and rPDA 90%, small, and diffusely diseased.  She was transferred to [facility] in [place] for CHF/volume optimization and CT surgical evaluation.    On arrival at [facility], she had a fall and developed a large right groin hematoma.  Heparin was stopped and she was subsequently suffered an acute R MCA stroke.  On review of notes, it was thought this R MCA CVA was likely cardioembolic.  She was moved to the ICU.  On arrival to the ICU, she developed chest pain and anterior ST elevation and was taken to the catheterization lab. Repeat cath showed pLAD 90%s with DESx1 and mLCx 95%s with DES x1. Follow-up echo showed EF 50%.  While in the hospital, she developed upper GI bleed on DAPT.  EGD 10/08/2015 showed duodenal ulcer, which was subsequently clipped.  ASA was stopped and Plavix continued.  She was transferred to CIR,  where she underwent aggressive stroke rehabilitation for severe right upper extremity weakness, aphasia, and left-sided neglect.    She was seen at post hospital follow-up 11/19/2019 by Dr. Adriana Kennedy and was joined by her son, Brittney Kennedy.  It was noted she remained weak on the left upper extremity more than lower extremity.  She still had some left-sided neglect.  She denied any chest pain or shortness of breath.  She reported having home health care  and OT coming 2 times per week.  Her BP was running high at home at times with recommendation to increase her antihypertensive if SBP consistently above 140.   When last seen in clinic, she was doing well. SBP 130s at home per patient and home DBP 60s with clinic BP today 148/62. She had a blue bird that was keeping her company at home. Of note, previous sx before her cath were nausea, which had not recurred.    Today, she returns to clinic and reports that she is doing fine from a cardiac standpoint.  She denies any chest pain, racing heart rate, palpitations, near syncope, or syncope.  She does report some soreness in her chest after banging into something that is not concerning to her.  She has not had to use her sublingual nitro.  She reports compliance with her Plavix.  She reports ongoing diarrhea since coming home from the hospital, which she attributes to magnesium.  We discussed that the benefits of magnesium may not outweigh those the risk of ongoing diarrhea.  She reports that she originally struggled with the increase to her Coreg, as her assistant did not understand English and therefore it was difficult to get her to increase the dose.  No shortness of breath or dyspnea.  She is working with a therapist to do daily stretches and exercises.  She reports her weight at home is 161.6 pounds.  She is trying to cut down on salt and is using salt substitutes.    Home Medications    Prior to Admission medications   Medication Sig Start Date End Date Taking? Authorizing Provider  acetaminophen (TYLENOL) 325 MG tablet Take 2 tablets (650 mg total) by mouth every 4 (four) hours as needed for mild pain or headache. 11/13/19  Yes Kennedy, Brittney Rossetti, PA-C  atorvastatin (LIPITOR) 80 MG tablet Take 1 tablet (80 mg total) by mouth daily at 6 PM. 12/11/19  Yes Kennedy, Brittney Buckles, MD  Calcium Carbonate-Vit D-Min (CALCIUM-VITAMIN D-MINERALS) 600-800 MG-UNIT CHEW Chew by mouth.   Yes [provider]    carvedilol (COREG) 12.5 MG tablet Take 1 tablet (12.5 mg total) by mouth 2 (two) times daily with a meal. 12/23/19  Yes Michaelle Birks, Jonus Coble D, PA-C  clopidogrel (PLAVIX) 75 MG tablet Take 1 tablet (75 mg total) by mouth daily with breakfast. 12/11/19  Yes Kennedy, Brittney Buckles, MD  darifenacin (ENABLEX) 15 MG 24 hr tablet Take 1 tablet (15 mg total) by mouth at bedtime. 12/11/19  Yes Kennedy, Brittney Buckles, MD  diclofenac Sodium (VOLTAREN) 1 % GEL Apply 2 g topically daily as needed.   Yes [provider]  docusate sodium (COLACE) 100 MG capsule Take 100 mg by mouth 2 (two) times daily.   Yes [provider]  hydrocortisone cream 1 % Apply 1 application topically 2 (two) times daily as needed for itching.   Yes [provider]  losartan (COZAAR) 100 MG tablet Take 1 tablet (100 mg total) by mouth daily. 12/11/19  Yes Kennedy, Brittney Buckles, MD  magnesium gluconate (MAGONATE) 500 MG tablet Take 1 tablet (500 mg total) by mouth every 6 (six) hours. 11/14/19  Yes Kennedy, Brittney Rossetti, PA-C  Melatonin 10 MG CAPS Take 20 mg by mouth daily as needed.    Yes [provider]  metFORMIN (GLUCOPHAGE) 500 MG tablet Take 1 tablet (500 mg total) by mouth daily with breakfast. 12/11/19  Yes Kennedy, Brittney Buckles, MD  Multiple Vitamin (MULTIVITAMIN WITH MINERALS) TABS tablet Take 1 tablet by mouth daily.   Yes [provider]  nitroGLYCERIN (NITROSTAT) 0.4 MG SL tablet Place 1 tablet (0.4 mg total) under the tongue every 5 (five) minutes as needed for chest pain. 11/13/19  Yes Kennedy, Brittney Rossetti, PA-C  pantoprazole (PROTONIX) 40 MG tablet Take 1 tablet (40 mg total) by mouth 2 (two) times daily before a meal. 11/13/19 11/12/20 Yes Kennedy, Brittney Rossetti, PA-C  tamsulosin (FLOMAX) 0.4 MG CAPS capsule Take 1 capsule (0.4 mg total) by mouth daily after supper. 12/11/19  Yes Kennedy, Brittney Buckles, MD    Review of Systems    She denies palpitations, dyspnea, pnd, orthopnea, n, v, dizziness, syncope, edema,  weight gain, or early satiety.  She reports residual chest soreness after banging into something and not attributed to angina but rather MSK etiology.  She reports ongoing diarrhea since coming home from the hospital and attributed to magnesium supplements.  She reports increasing energy, especially with her PT exercises.  All other systems reviewed and are otherwise negative except as noted above.  Physical Exam    VS:  BP (!) 136/72   Pulse 67   Ht 5\' 2"  (1.575 m)   Wt 163 lb (73.9 kg)   SpO2 99%   BMI 29.81 kg/m  , BMI Body mass index is 29.81 kg/m. GEN: Well nourished, well developed, in no acute distress. Seated in wheelchair. HEENT: normal. Neck: Supple, no JVD, carotid bruits, or masses. Cardiac: RRR, no murmurs, rubs, or gallops. No clubbing, cyanosis, edema.  Radials/DP/PT 2+ and equal bilaterally.  Respiratory:  Respirations regular and unlabored, clear to auscultation bilaterally. GI: Soft, nontender, nondistended, BS + x 4. MS: no deformity or atrophy. Skin: warm and dry, no rash. Neuro:  No visualized deficits.  Psych: Normal affect.  Accessory Clinical Findings    ECG personally reviewed by me today - NSR, low voltage EKG, 65bpm, nonspecific ST/T changesI- no acute changes.  VITALS Reviewed today   Temp Readings from Last 3 Encounters:  12/17/19 98.7 F (37.1 C)  11/19/19 97.7 F (36.5 C)  11/14/19 97.8 F (36.6 C) (Oral)   BP Readings from Last 3 Encounters:  01/31/20 (!) 136/72  01/30/20 (!) 130/64  01/22/20 140/60   Pulse Readings from Last 3 Encounters:  01/31/20 67  01/22/20 64  12/27/19 66    Wt Readings from Last 3 Encounters:  01/31/20 163 lb (73.9 kg)  01/22/20 162 lb (73.5 kg)  12/27/19 152 lb 8 oz (69.2 kg)     LABS  reviewed today   Lab Results  Component Value Date   WBC 7.7 12/23/2019   HGB 13.2 12/23/2019   HCT 39.6 12/23/2019   MCV 87 12/23/2019   PLT 234 12/23/2019   Lab Results  Component Value Date   CREATININE 0.78  12/23/2019   BUN 15 12/23/2019   NA 141 12/23/2019   K 4.6 12/23/2019   CL 103 12/23/2019   CO2 22 12/23/2019   Lab Results  Component Value Date   ALT 19 10/17/2019   AST  34 10/17/2019   ALKPHOS 75 10/17/2019   BILITOT 1.1 10/17/2019   Lab Results  Component Value Date   CHOL 99 10/01/2019   HDL 27 (L) 10/01/2019   LDLCALC 37 10/01/2019   TRIG 174 (H) 10/01/2019   CHOLHDL 3.7 10/01/2019    Lab Results  Component Value Date   HGBA1C 5.9 (H) 09/26/2019   Lab Results  Component Value Date   TSH 0.048 (L) 09/28/2019     STUDIES/PROCEDURES reviewed today   Echo 10/02/19 1. Left ventricular ejection fraction, by estimation, is 50 to 55%. The  left ventricle has low normal function. The left ventricle demonstrates  regional wall motion abnormalities (see scoring diagram/findings for  description). There is mild concentric  left ventricular hypertrophy. Left ventricular diastolic parameters are  indeterminate. Elevated left ventricular end-diastolic pressure. There is  mild hypokinesis of the left ventricular, entire anteroseptal wall. There  is mild hypokinesis of the left  ventricular, mid-apical anterior wall.  2. Right ventricular systolic function is normal. The right ventricular  size is normal. Tricuspid regurgitation signal is inadequate for assessing  PA pressure.  3. The mitral valve is normal in structure. Trivial mitral valve  regurgitation. No evidence of mitral stenosis.  4. The aortic valve is tricuspid. Aortic valve regurgitation is not  visualized. No aortic stenosis is present.   LHC 09/30/19  Proximal LAD is 90% stenosed  A drug-eluting stent was successfully placed using a STENT RESOLUTE ONYX 3.0X12.  Post intervention, there is a 0% residual stenosis.  Mid Cx lesion is 95% stenosed.  A drug-eluting stent was successfully placed using a STENT RESOLUTE ONYX 2.5X26.  Post intervention, there is a 0% residual stenosis. 1. Successful PCI of  the proximal LAD with DES x 1 2. Successful PCI of the mid LCx with DES x 1.  Plan: DAPT for one year. Will continue IV Cangrelor until NG in place and patient can be loaded with oral Plavix.   LHC 09/27/19  The left ventricular ejection fraction is less than 25% by visual estimate.  LV end diastolic pressure is severely elevated.  There is severe left ventricular systolic dysfunction.  RPDA lesion is 90% stenosed.  Ramus lesion is 90% stenosed.  3rd Mrg lesion is 90% stenosed.  Dist LAD-1 lesion is 80% stenosed.  Dist LAD-2 lesion is 50% stenosed.  Prox LAD lesion is 90% stenosed.    Assessment & Plan    Severe 3v CAD s/p PCI / DES to LAD and mLCx --No CP or angina. Cath 09/2019 as above with DES x2 to LAD and mLCx. Continue Plavix, BB at increased dose, and high intensity statin. Most recent LDL 37 and at goal. Liver function stable. No ASA given h/o recent GIB. PRN SL nitro. Continue PT/OT and increase activity as tolerated. She did not obtain cardiac rehab but is satisfied with her PT/OT.   HFrEF, improved LV function s/p revascularization --No s/sx of worsening heart failure. Euvolemic and well compensated on exam.  Echo EF 20% prior to PCI with most recent echo showing low normal EF 50-55% s/p stenting, mild concentric LVH, elevated LVEDP, mild hypokinesis of the LV and entire anteroseptal /mid apical anterior wall. Continue increased BB, losartan  daily. She is not currently on a standing diuretic and without indication to start one today. Reassess volume status at RTC.   HTN --BP mildly elevated. Reports home BP well controlled. Continue Coreg at increased dose of  BID and losartan  daily.  History of CVA --Suspected  cardioembolic --Continue PT/OT   PUD/GIB --S/p clipping of duodenal ulcer 10/2019. Denies s/sx of GIB today. No ASA as above.   DM2 --Continue metformin.  Medication changes: Increased BB to Coreg 12.5mg  BID. Labs ordered: None Studies  / Imaging ordered: None. Future considerations: Periodic echo. Disposition: RTC 3 months.   Lennon Alstrom, PA-C

## 2020-02-01 LAB — BASIC METABOLIC PANEL
BUN/Creatinine Ratio: 32 — ABNORMAL HIGH (ref 12–28)
BUN: 29 mg/dL — ABNORMAL HIGH (ref 8–27)
CO2: 26 mmol/L (ref 20–29)
Calcium: 10.5 mg/dL — ABNORMAL HIGH (ref 8.7–10.3)
Chloride: 100 mmol/L (ref 96–106)
Creatinine, Ser: 0.92 mg/dL (ref 0.57–1.00)
GFR calc Af Amer: 74 mL/min/{1.73_m2} (ref >59)
GFR calc non Af Amer: 64 mL/min/{1.73_m2} (ref >59)
Glucose: 154 mg/dL — ABNORMAL HIGH (ref 65–99)
Potassium: 5.2 mmol/L (ref 3.5–5.2)
Sodium: 142 mmol/L (ref 134–144)

## 2020-02-05 ENCOUNTER — Telehealth: Payer: Self-pay

## 2020-02-05 NOTE — Telephone Encounter (Signed)
Attempted to call patient. LMTCB 02/05/2020   

## 2020-02-05 NOTE — Telephone Encounter (Signed)
-----   Message from Lennon Alstrom, PA-C sent at 02/05/2020  3:14 PM EDT ----- Potassium elevated. Please have her repeat a BMET to confirm. She noted frequent diarrhea on her magnesium supplement, so let's also check a magnesium on her. If this value is at goal, we can likely discontinue it.

## 2020-02-10 NOTE — Telephone Encounter (Signed)
Attempted to call patient. LMTCB 02/10/2020   

## 2020-02-18 NOTE — Telephone Encounter (Signed)
Attempted to call patient. LMTCB 02/18/2020  3rd attempt.  Letter sent.  Dear [name],   ?   This letter is to inform you to call the office for multiple missed calls to discuss results. We have made 3 attempts to reach you by phone @ [phone] and left multiple messages.   ?   Please return call to clinic so we can discuss your plan of care. If you would like to update your account with any new numbers you can do so at that time.    ?   Thank you for allowing [place] to care for you.   ?   ?   Sincerely,   ?   Makaylyn Sinyard RN

## 2020-03-10 ENCOUNTER — Other Ambulatory Visit (HOSPITAL_COMMUNITY): Payer: Self-pay | Admitting: Internal Medicine

## 2020-04-17 ENCOUNTER — Other Ambulatory Visit: Payer: Self-pay

## 2020-04-17 MED ORDER — PANTOPRAZOLE SODIUM 40 MG PO TBEC: 40.0000 mg | DELAYED_RELEASE_TABLET | Freq: Every day | ORAL | 3 refills | Status: DC

## 2020-04-18 NOTE — Progress Notes (Deleted)
Cardiology Office Note  Date:  04/18/2020   ID:  Brittney Kennedy, DOB December 19, 1950, MRN [id number]  PCP:  Patient, No Pcp Per   No chief complaint on file.   HPI:  Brittney Kennedy is a 69 y.o. female with PMH of  CAD,   [place] 09/22/2019 with progressive heart failure symptoms.    EF 20%,  Cardiac cath 3/26 showed severe multivessel CAD  Transferred to [facility], Suffered a fall, developed a groin hematoma,  Acute Right MCA stroke,  then anterior ST elevation, pLAD 90% with DES and mLCx 95%s with DES  EF 50%.   upper GI bleed on DAPT.   EGD 10/08/2015 showed duodenal ulcer, which was subsequently clipped.   She presents for f/u of her CAD   Cath 09/27/19 Severe three-vessel native coronary disease Severe proximal LAD, and distal LAD Severe AV groove circumflex/OM 2 disease Severe mid PDA disease Severe high OM/ramus disease Left ventricular end-diastolic pressure 35   PMH:   has a past medical history of CAD (coronary artery disease), HFrEF (heart failure with reduced ejection fraction) ([place]), PUD (peptic ulcer disease), and Seasonal allergies.  PSH:    Past Surgical History:  Procedure Laterality Date  . CHOLECYSTECTOMY    . CORONARY STENT INTERVENTION N/A 09/30/2019   Procedure: CORONARY STENT INTERVENTION;  Surgeon: Martinique, Peter M, MD;  Location: [place] CV LAB;  Service: Cardiovascular;  Laterality: N/A;  . ESOPHAGOGASTRODUODENOSCOPY (EGD) WITH PROPOFOL N/A 10/08/2019   Procedure: ESOPHAGOGASTRODUODENOSCOPY (EGD) WITH PROPOFOL;  Surgeon: Yetta Flock, MD;  Location: [place];  Service: Gastroenterology;  Laterality: N/A;  . HEMOSTASIS CLIP PLACEMENT  10/08/2019   Procedure: HEMOSTASIS CLIP PLACEMENT;  Surgeon: Yetta Flock, MD;  Location: [place];  Service: Gastroenterology;;  . HEMOSTASIS CONTROL  10/08/2019   Procedure: HEMOSTASIS CONTROL;  Surgeon: Yetta Flock, MD;  Location: [facility];  Service: Gastroenterology;;  . LEFT HEART CATH AND  CORONARY ANGIOGRAPHY N/A 09/30/2019   Procedure: LEFT HEART CATH AND CORONARY ANGIOGRAPHY;  Surgeon: Martinique, Peter M, MD;  Location: [place] CV LAB;  Service: Cardiovascular;  Laterality: N/A;  . LEFT HEART CATH AND CORS/GRAFTS ANGIOGRAPHY N/A 09/27/2019   Procedure: LEFT HEART CATH AND CORS/GRAFTS ANGIOGRAPHY;  Surgeon: Minna Merritts, MD;  Location: [place] CV LAB;  Service: Cardiovascular;  Laterality: N/A;    Current Outpatient Medications  Medication Sig Dispense Refill  . acetaminophen (TYLENOL) 325 MG tablet Take 2 tablets (650 mg total) by mouth every 4 (four) hours as needed for mild pain or headache.    Marland Kitchen atorvastatin (LIPITOR) 80 MG tablet Take 1 tablet (80 mg total) by mouth daily at 6 PM. 30 tablet 3  . Calcium Carbonate-Vit D-Min (CALCIUM-VITAMIN D-MINERALS) 600-800 MG-UNIT CHEW Chew by mouth.    . carvedilol (COREG) 25 MG tablet Take 1 tablet (25 mg total) by mouth 2 (two) times daily. 180 tablet 1  . clopidogrel (PLAVIX) 75 MG tablet Take 1 tablet (75 mg total) by mouth daily with breakfast. 30 tablet 3  . darifenacin (ENABLEX) 15 MG 24 hr tablet Take 1 tablet (15 mg total) by mouth at bedtime. 30 tablet 2  . diclofenac Sodium (VOLTAREN) 1 % GEL Apply 2 g topically daily as needed.    . docusate sodium (COLACE) 100 MG capsule Take 100 mg by mouth 2 (two) times daily.    . hydrocortisone cream 1 % Apply 1 application topically 2 (two) times daily as needed for itching.    . losartan (COZAAR) 100 MG  tablet Take 1 tablet (100 mg total) by mouth daily. 30 tablet 3  . magnesium gluconate (MAGONATE) 500 MG tablet Take 500 mg by mouth every 8 (eight) hours.    . Melatonin 10 MG CAPS Take 20 mg by mouth daily as needed.     . metFORMIN (GLUCOPHAGE) 500 MG tablet Take 1 tablet (500 mg total) by mouth daily with breakfast. 30 tablet 3  . Multiple Vitamin (MULTIVITAMIN WITH MINERALS) TABS tablet Take 1 tablet by mouth daily.    . nitroGLYCERIN (NITROSTAT) 0.4 MG SL tablet Place  1 tablet (0.4 mg total) under the tongue every 5 (five) minutes as needed for chest pain. 30 tablet 12  . pantoprazole (PROTONIX) 40 MG tablet Take 1 tablet (40 mg total) by mouth daily before breakfast. 90 tablet 3  . tamsulosin (FLOMAX) 0.4 MG CAPS capsule Take 1 capsule (0.4 mg total) by mouth daily after supper. 30 capsule 2   No current facility-administered medications for this visit.     Allergies:   Onion and Monosodium glutamate   Social History:  The patient  reports that she has never smoked. She has never used smokeless tobacco. She reports that she does not drink alcohol and does not use drugs.   Family History:   family history includes Multiple myeloma in her father and sister; Stroke in her mother.    Review of Systems: ROS   PHYSICAL EXAM: VS:  There were no vitals taken for this visit. , BMI There is no height or weight on file to calculate BMI. GEN: Well nourished, well developed, in no acute distress HEENT: normal Neck: no JVD, carotid bruits, or masses Cardiac: RRR; no murmurs, rubs, or gallops,no edema  Respiratory:  clear to auscultation bilaterally, normal work of breathing GI: soft, nontender, nondistended, + BS MS: no deformity or atrophy Skin: warm and dry, no rash Neuro:  Strength and sensation are intact Psych: euthymic mood, full affect    Recent Labs: 09/28/2019: TSH 0.048 10/17/2019: ALT 19 11/19/2019: Magnesium 1.6 12/23/2019: Hemoglobin 13.2; Platelets 234 01/31/2020: BUN 29; Creatinine, Ser 0.92; Potassium 5.2; Sodium 142    Lipid Panel Lab Results  Component Value Date   CHOL 99 10/01/2019   HDL 27 (L) 10/01/2019   LDLCALC 37 10/01/2019   TRIG 174 (H) 10/01/2019      Wt Readings from Last 3 Encounters:  01/31/20 163 lb (73.9 kg)  01/22/20 162 lb (73.5 kg)  12/27/19 152 lb 8 oz (69.2 kg)       ASSESSMENT AND PLAN:  Problem List Items Addressed This Visit    None       Disposition:   F/U  12 months   Total encounter  time more than 25 minutes  Greater than 50% was spent in counseling and coordination of care with the patient    Signed, Brittney Kennedy, M.D., Ph.D. [place], [place]

## 2020-04-20 ENCOUNTER — Ambulatory Visit: Payer: Medicare Other | Admitting: Cardiovascular Disease

## 2020-04-20 DIAGNOSIS — E782 Mixed hyperlipidemia: Secondary | ICD-10-CM

## 2020-04-20 DIAGNOSIS — I25118 Atherosclerotic heart disease of native coronary artery with other forms of angina pectoris: Secondary | ICD-10-CM

## 2020-04-20 DIAGNOSIS — I10 Essential (primary) hypertension: Secondary | ICD-10-CM

## 2020-04-20 DIAGNOSIS — Z8719 Personal history of other diseases of the digestive system: Secondary | ICD-10-CM

## 2020-04-20 DIAGNOSIS — I63411 Cerebral infarction due to embolism of right middle cerebral artery: Secondary | ICD-10-CM

## 2020-04-20 MED ORDER — PANTOPRAZOLE SODIUM 40 MG PO TBEC: 40.0000 mg | DELAYED_RELEASE_TABLET | Freq: Every day | ORAL | 3 refills | Status: AC

## 2020-04-20 NOTE — Addendum Note (Signed)
Addended by: Jeanine Luz on: 04/20/2020 11:43 AM   Modules accepted: Orders

## 2020-04-21 NOTE — Progress Notes (Signed)
Cardiology Office Note  Date:  04/22/2020   ID:  Brittney Kennedy, DOB April 29, 1951, MRN [id number]  PCP:  Patient, No Pcp Per   Chief Complaint  Patient presents with  . Follow-up    2-3 month  Pt states no new Sx.    HPI:  Brittney Kennedy is a 69 y.o. female with PMH of  CAD,   [place] 09/22/2019 with progressive heart failure symptoms.    EF 20%,  Cardiac cath 3/26 showed severe multivessel CAD  Transferred to [facility], Suffered a fall, developed a groin hematoma,  Acute Right MCA stroke,  then anterior ST elevation, pLAD 90% with DES and mLCx 95%s with DES  EF 50%.   upper GI bleed on DAPT.   EGD 10/08/2015 showed duodenal ulcer, which was subsequently clipped.   She presents for f/u of her CAD  Presents today with her son who is visiting from [place] by arthritis, [place] out of wheelchair once uses a walker No regular exercise Significant gait instability No regular exercise program  Before am meds, BP can run high Improved after medications Pulse 60 on average  Lab work reviewed HBA1C 7.0 rommate cooks desserts She eats the Chief Technology Officer. Blames the roommate  Working on venturing out to do some walking outside the house on the sidewalk  EKG personally reviewed by myself on todays visit Shows sinus bradycardia rate 57 bpm nonspecific T wave abnormality 1 and aVL  Cath 09/27/19 Severe three-vessel native coronary disease Severe proximal LAD, and distal LAD Severe AV groove circumflex/OM 2 disease Severe mid PDA disease Severe high OM/ramus disease Left ventricular end-diastolic pressure 35   PMH:   has a past medical history of CAD (coronary artery disease), HFrEF (heart failure with reduced ejection fraction) ([place]), PUD (peptic ulcer disease), and Seasonal allergies.  PSH:    Past Surgical History:  Procedure Laterality Date  . CHOLECYSTECTOMY    . CORONARY STENT INTERVENTION N/A 09/30/2019   Procedure: CORONARY STENT INTERVENTION;  Surgeon: Martinique,  Peter M, MD;  Location: [place] [facility];  Service: Cardiovascular;  Laterality: N/A;  . ESOPHAGOGASTRODUODENOSCOPY (EGD) WITH PROPOFOL N/A 10/08/2019   Procedure: ESOPHAGOGASTRODUODENOSCOPY (EGD) WITH PROPOFOL;  Surgeon: Yetta Flock, MD;  Location: [facility];  Service: Gastroenterology;  Laterality: N/A;  . HEMOSTASIS CLIP PLACEMENT  10/08/2019   Procedure: HEMOSTASIS CLIP PLACEMENT;  Surgeon: Yetta Flock, MD;  Location: [place];  Service: Gastroenterology;;  . HEMOSTASIS CONTROL  10/08/2019   Procedure: HEMOSTASIS CONTROL;  Surgeon: Yetta Flock, MD;  Location: [facility];  Service: Gastroenterology;;  . LEFT HEART CATH AND CORONARY ANGIOGRAPHY N/A 09/30/2019   Procedure: LEFT HEART CATH AND CORONARY ANGIOGRAPHY;  Surgeon: Martinique, Peter M, MD;  Location: [place] [facility];  Service: Cardiovascular;  Laterality: N/A;  . LEFT HEART CATH AND CORS/GRAFTS ANGIOGRAPHY N/A 09/27/2019   Procedure: LEFT HEART CATH AND CORS/GRAFTS ANGIOGRAPHY;  Surgeon: Minna Merritts, MD;  Location: [place] [facility];  Service: Cardiovascular;  Laterality: N/A;    Current Outpatient Medications  Medication Sig Dispense Refill  . acetaminophen (TYLENOL) 325 MG tablet Take 2 tablets (650 mg total) by mouth every 4 (four) hours as needed for mild pain or headache.    Marland Kitchen atorvastatin (LIPITOR) 80 MG tablet Take 1 tablet (80 mg total) by mouth daily at 6 PM. 30 tablet 3  . Calcium Carbonate-Vit D-Min (CALCIUM-VITAMIN D-MINERALS) 600-800 MG-UNIT CHEW Chew by mouth.    . carvedilol (COREG) 25 MG tablet Take 1 tablet (  25 mg total) by mouth 2 (two) times daily. 180 tablet 1  . clopidogrel (PLAVIX) 75 MG tablet Take 1 tablet (75 mg total) by mouth daily with breakfast. 30 tablet 3  . darifenacin (ENABLEX) 15 MG 24 hr tablet Take 1 tablet (15 mg total) by mouth at bedtime. 30 tablet 2  . diclofenac Sodium (VOLTAREN) 1 % GEL Apply 2 g topically daily as needed.    . docusate sodium (COLACE) 100  MG capsule Take 100 mg by mouth 2 (two) times daily.    . hydrocortisone cream 1 % Apply 1 application topically 2 (two) times daily as needed for itching.    . losartan (COZAAR) 100 MG tablet Take 1 tablet (100 mg total) by mouth daily. 30 tablet 3  . magnesium gluconate (MAGONATE) 500 MG tablet Take 500 mg by mouth every 8 (eight) hours.    . Melatonin 10 MG CAPS Take 20 mg by mouth daily as needed.     . metFORMIN (GLUCOPHAGE) 500 MG tablet Take 1 tablet (500 mg total) by mouth daily with breakfast. 30 tablet 3  . Multiple Vitamin (MULTIVITAMIN WITH MINERALS) TABS tablet Take 1 tablet by mouth daily.    . nitroGLYCERIN (NITROSTAT) 0.4 MG SL tablet Place 1 tablet (0.4 mg total) under the tongue every 5 (five) minutes as needed for chest pain. 30 tablet 12  . pantoprazole (PROTONIX) 40 MG tablet Take 1 tablet (40 mg total) by mouth daily before breakfast. 90 tablet 3  . tamsulosin (FLOMAX) 0.4 MG CAPS capsule Take 1 capsule (0.4 mg total) by mouth daily after supper. 30 capsule 2   No current facility-administered medications for this visit.     Allergies:   Onion and Monosodium glutamate   Social History:  The patient  reports that she has never smoked. She has never used smokeless tobacco. She reports that she does not drink alcohol and does not use drugs.   Family History:   family history includes Multiple myeloma in her father and sister; Stroke in her mother.    Review of Systems: Review of Systems  Constitutional: Negative.   HENT: Negative.   Respiratory: Negative.   Cardiovascular: Negative.   Gastrointestinal: Negative.   Musculoskeletal: Negative.        Gait instability  Neurological: Negative.   Psychiatric/Behavioral: Negative.   All other systems reviewed and are negative.    PHYSICAL EXAM: VS:  BP 122/60   Pulse (!) 57   Ht 5' 2"  (1.575 m)   Wt 170 lb 9.6 oz (77.4 kg)   BMI 31.20 kg/m  , BMI Body mass index is 31.2 kg/m. Constitutional:  oriented to  person, place, and time. No distress.  HENT:  Head: Grossly normal Eyes:  no discharge. No scleral icterus.  Neck: No JVD, no carotid bruits  Cardiovascular: Regular rate and rhythm, no murmurs appreciated Pulmonary/Chest: Clear to auscultation bilaterally, no wheezes or rails Abdominal: Soft.  no distension.  no tenderness.  Musculoskeletal: Normal range of motion Neurological:  normal muscle tone. Coordination normal. No atrophy Skin: Skin warm and dry Psychiatric: normal affect, pleasant   Recent Labs: 09/28/2019: TSH 0.048 10/17/2019: ALT 19 11/19/2019: Magnesium 1.6 12/23/2019: Hemoglobin 13.2; Platelets 234 01/31/2020: BUN 29; Creatinine, Ser 0.92; Potassium 5.2; Sodium 142    Lipid Panel Lab Results  Component Value Date   CHOL 99 10/01/2019   HDL 27 (L) 10/01/2019   LDLCALC 37 10/01/2019   TRIG 174 (H) 10/01/2019      Wt Readings  from Last 3 Encounters:  04/22/20 170 lb 9.6 oz (77.4 kg)  01/31/20 163 lb (73.9 kg)  01/22/20 162 lb (73.5 kg)     ASSESSMENT AND PLAN:  Problem List Items Addressed This Visit      Cardiology Problems   Essential hypertension   CAD (coronary artery disease) - Primary   Relevant Orders   EKG 12-Lead   Cerebral embolism with cerebral infarction    Other Visit Diagnoses    Chronic systolic heart failure (HCC)       Mixed hyperlipidemia         CAD with stable angina Cholesterol at goal HBA1C elevated 7.0, long discussion with her concerning her diet and need for weight loss No anginal sx No further testing at this time  Diabetes type 2 with complications Z3P elevated, Discussed changing her diet, eating too much dessert provided by her roommate If unable to get numbers down primary care may need to increase the Metformin  Weakness Lifestyle modification recommended  Upper GI bleed Denies any recurrent bleeding   Total encounter time more than 25 minutes  Greater than 50% was spent in counseling and coordination of  care with the patient    Signed, Esmond Plants, M.D., Ph.D. [place], [place]

## 2020-04-22 ENCOUNTER — Encounter: Payer: Self-pay | Admitting: Cardiovascular Disease

## 2020-04-22 ENCOUNTER — Other Ambulatory Visit: Payer: Self-pay

## 2020-04-22 ENCOUNTER — Ambulatory Visit (INDEPENDENT_AMBULATORY_CARE_PROVIDER_SITE_OTHER): Payer: Medicare Other | Admitting: Cardiovascular Disease

## 2020-04-22 VITALS — BP 122/60 | HR 57 | Ht 62.0 in | Wt 170.6 lb

## 2020-04-22 DIAGNOSIS — I63411 Cerebral infarction due to embolism of right middle cerebral artery: Secondary | ICD-10-CM | POA: Diagnosis not present

## 2020-04-22 DIAGNOSIS — I25118 Atherosclerotic heart disease of native coronary artery with other forms of angina pectoris: Secondary | ICD-10-CM

## 2020-04-22 DIAGNOSIS — I749 Embolism and thrombosis of unspecified artery: Secondary | ICD-10-CM

## 2020-04-22 DIAGNOSIS — I10 Essential (primary) hypertension: Secondary | ICD-10-CM | POA: Diagnosis not present

## 2020-04-22 DIAGNOSIS — I5022 Chronic systolic (congestive) heart failure: Secondary | ICD-10-CM | POA: Diagnosis not present

## 2020-04-22 DIAGNOSIS — E782 Mixed hyperlipidemia: Secondary | ICD-10-CM

## 2020-04-22 NOTE — Patient Instructions (Addendum)
Medication Instructions:  No changes  If you need a refill on your cardiac medications before your next appointment, please call your pharmacy.    Lab work: No new labs needed   If you have labs (blood work) drawn today and your tests are completely normal, you will receive your results only by: . MyChart Message (if you have MyChart) OR . A paper copy in the mail If you have any lab test that is abnormal or we need to change your treatment, we will call you to review the results.   Testing/Procedures: No new testing needed   Follow-Up: At [facility], you and your health needs are our priority.  As part of our continuing mission to provide you with exceptional heart care, we have created designated Provider Care Teams.  These Care Teams include your primary Cardiologist (physician) and Advanced Practice Providers (APPs -  Physician Assistants and Nurse Practitioners) who all work together to provide you with the care you need, when you need it.  . You will need a follow up appointment in 6 months  . Providers on your designated Care Team:   . Christopher Berge, NP . Ryan Dunn, PA-C . Jacquelyn Visser, PA-C  Any Other Special Instructions Will Be Listed Below (If Applicable).  COVID-19 Vaccine Information can be found at: [email or web address] For questions related to vaccine distribution or appointments, please email [email or web address] or call [phone].     

## 2020-04-28 ENCOUNTER — Ambulatory Visit: Payer: Medicare Other | Admitting: Cardiovascular Disease

## 2020-06-24 ENCOUNTER — Other Ambulatory Visit: Payer: Self-pay

## 2020-06-24 ENCOUNTER — Ambulatory Visit (INDEPENDENT_AMBULATORY_CARE_PROVIDER_SITE_OTHER): Payer: Medicare Other | Admitting: Urology

## 2020-06-24 ENCOUNTER — Encounter: Payer: Self-pay | Admitting: Urology

## 2020-06-24 VITALS — BP 148/78 | HR 64 | Ht 62.0 in | Wt 167.0 lb

## 2020-06-24 DIAGNOSIS — R3915 Urgency of urination: Secondary | ICD-10-CM

## 2020-06-24 DIAGNOSIS — I749 Embolism and thrombosis of unspecified artery: Secondary | ICD-10-CM

## 2020-06-24 LAB — URINALYSIS, COMPLETE
Bilirubin, UA: NEGATIVE
Glucose, UA: NEGATIVE
Ketones, UA: NEGATIVE
Leukocytes,UA: NEGATIVE
Nitrite, UA: NEGATIVE
Protein,UA: NEGATIVE
RBC, UA: NEGATIVE
Specific Gravity, UA: 1.01 (ref 1.005–1.030)
Urobilinogen, Ur: 1 mg/dL (ref 0.2–1.0)
pH, UA: 6.5 (ref 5.0–7.5)

## 2020-06-24 LAB — MICROSCOPIC EXAMINATION: Bacteria, UA: NONE SEEN

## 2020-06-24 NOTE — Progress Notes (Signed)
06/24/2020 3:08 PM   Brittney Kennedy [birth year] [id number]  Referring provider: No referring provider defined for this encounter.  Chief Complaint  Patient presents with  . Follow-up    HPI:  F/u -   1) urinary frequency - She was recently hospitalized 05/21 with a non-STEMI MI, heart failure and then suffered a right MCA infarct. She was on tamsulosin and Enablex. I did not see that she had urinary retention. She had some trouble voiding in hospital but was in bed and had to use a urinal and purewick. Voiding better now but she has some urgency. Noc x 5-6. No incontinence. Some constipation.   PVR normal.  We discussed PT and she continued working with OT at her SNF. She continued Enablex.   2) microscopic hematuria - A UA showed 6-10 red blood cells per high-powered field. Urine culture less than 20,000 strep. She underwent CT scan of the abdomen and pelvis on 09/26/2019 and 09/28/2019 with benign urinary tract. There was a comment on delayed nephrograms on the second scan, but there was no hydronephrosis. Her creatinine was normal at 0.7 - 0.8. She denies gross hematuria.Cystoscopy was benign 06/21. F/u UA no hematuria.   She returns in management of the above. UA is clear today. She has some RUQ discomfort. She's had some constipation. No gross hematuria. She does have some urgency and UUI but it is manageable. She does OK as long as she "pays attention to the urge" to void. She continues tamsulosin and has a good stream.   PMH: Past Medical History:  Diagnosis Date  . CAD (coronary artery disease)    a. 09/2019 NSTEMI/Cath: LM nl, LAD 90p, 40m diffuse mid-dist dzs, LCX 979mV groove/OM2, RI 90p, RCA dominant, nl, RPDA 9055mF 25%, glob HK.  . HMarland KitchenrEF (heart failure with reduced ejection fraction) ([facility]  a. 09/2019 LV gram: EF 25%, global HK.  . PMarland KitchenD (peptic ulcer disease)   . Seasonal allergies     Surgical History: Past Surgical History:  Procedure Laterality  Date  . CHOLECYSTECTOMY    . CORONARY STENT INTERVENTION N/A 09/30/2019   Procedure: CORONARY STENT INTERVENTION;  Surgeon: JorMartiniqueeter M, MD;  [facility];  Service: Cardiovascular;  Laterality: N/A;  . ESOPHAGOGASTRODUODENOSCOPY (EGD) WITH PROPOFOL N/A 10/08/2019   Procedure: ESOPHAGOGASTRODUODENOSCOPY (EGD) WITH PROPOFOL;  Surgeon: ArmYetta FlockD;  [facility];  Laterality: N/A;  . HEMOSTASIS CLIP PLACEMENT  10/08/2019   Procedure: HEMOSTASIS CLIP PLACEMENT;  Surgeon: ArmYetta FlockD;  [facility];;  . HEMOSTASIS CONTROL  10/08/2019   Procedure: HEMOSTASIS CONTROL;  Surgeon: ArmYetta FlockD;  [facility];;  . LEFT HEART CATH AND CORONARY ANGIOGRAPHY N/A 09/30/2019   Procedure: LEFT HEART CATH AND CORONARY ANGIOGRAPHY;  Surgeon: JorMartiniqueeter M, MD;  [facility];  Service: Cardiovascular;  Laterality: N/A;  . LEFT HEART CATH AND CORS/GRAFTS ANGIOGRAPHY N/A 09/27/2019   Procedure: LEFT HEART CATH AND CORS/GRAFTS ANGIOGRAPHY;  Surgeon: GolMinna MerrittsD;  [facility];  Service: Cardiovascular;  Laterality: N/A;    Home Medications:  Allergies as of 06/24/2020      Reactions   Onion Anaphylaxis, Hives   Monosodium Glutamate Nausea Only   Headache Fatigue      Medication List       Accurate as of June 24, 2020  3:08 PM. If you have any  questions, ask your nurse or doctor.        acetaminophen 325 MG tablet Commonly known as: TYLENOL Take 2 tablets (650 mg total) by mouth every 4 (four) hours as needed for mild pain or headache.   atorvastatin 80 MG tablet Commonly known as: LIPITOR Take 1 tablet (80 mg total) by mouth daily at 6 PM.   Calcium-Vitamin D-Minerals 600-800 MG-UNIT Chew Chew by mouth.   carvedilol 25 MG tablet Commonly known as: COREG Take 1 tablet (25 mg total) by  mouth 2 (two) times daily.   clopidogrel 75 MG tablet Commonly known as: PLAVIX Take 1 tablet (75 mg total) by mouth daily with breakfast.   darifenacin 15 MG 24 hr tablet Commonly known as: ENABLEX Take 1 tablet (15 mg total) by mouth at bedtime.   diclofenac Sodium 1 % Gel Commonly known as: VOLTAREN Apply 2 g topically daily as needed.   docusate sodium 100 MG capsule Commonly known as: COLACE Take 100 mg by mouth 2 (two) times daily.   glipiZIDE 5 MG tablet Commonly known as: GLUCOTROL Take by mouth.   hydrocortisone cream 1 % Apply 1 application topically 2 (two) times daily as needed for itching.   losartan 100 MG tablet Commonly known as: COZAAR Take 1 tablet (100 mg total) by mouth daily.   magnesium gluconate 500 MG tablet Commonly known as: MAGONATE Take 500 mg by mouth every 8 (eight) hours.   Melatonin 10 MG Caps Take 20 mg by mouth daily as needed.   metFORMIN 500 MG tablet Commonly known as: GLUCOPHAGE Take 1 tablet (500 mg total) by mouth daily with breakfast.   multivitamin with minerals Tabs tablet Take 1 tablet by mouth daily.   nitroGLYCERIN 0.4 MG SL tablet Commonly known as: NITROSTAT Place 1 tablet (0.4 mg total) under the tongue every 5 (five) minutes as needed for chest pain.   pantoprazole 40 MG tablet Commonly known as: PROTONIX Take 1 tablet (40 mg total) by mouth daily before breakfast.   tamsulosin 0.4 MG Caps capsule Commonly known as: FLOMAX Take 1 capsule (0.4 mg total) by mouth daily after supper.       Allergies:  Allergies  Allergen Reactions  . Onion Anaphylaxis and Hives  . Monosodium Glutamate Nausea Only    Headache Fatigue    Family History: Family History  Problem Relation Age of Onset  . Stroke Mother        died @ 39  . Multiple myeloma Father        died @ 35  . Multiple myeloma Sister     Social History:  reports that she has never smoked. She has never used smokeless tobacco. She reports that  she does not drink alcohol and does not use drugs.   Physical Exam: BP (!) 148/78 (BP Location: Left Arm, Patient Position: Sitting, Cuff Size: Normal)   Pulse 64   Ht 5' 2"  (1.575 m)   Wt 167 lb (75.8 kg)   BMI 30.54 kg/m   Constitutional:  Alert and oriented, No acute distress. HEENT: [place] AT, moist mucus membranes.  Trachea midline, no masses. Cardiovascular: No clubbing, cyanosis, or edema. Respiratory: Normal respiratory effort, no increased work of breathing. GI: Abdomen is soft, nontender, nondistended, no abdominal masses GU: No CVA tenderness Skin: No rashes, bruises or suspicious lesions. Neurologic: Grossly intact, no focal deficits, moving all 4 extremities. Psychiatric: Normal mood and affect.  Laboratory Data: Lab Results  Component Value Date   WBC 7.7 12/23/2019  HGB 13.2 12/23/2019   HCT 39.6 12/23/2019   MCV 87 12/23/2019   PLT 234 12/23/2019    Lab Results  Component Value Date   CREATININE 0.92 01/31/2020    No results found for: PSA  No results found for: TESTOSTERONE  Lab Results  Component Value Date   HGBA1C 5.9 (H) 09/26/2019    Urinalysis    Component Value Date/Time   COLORURINE YELLOW 11/03/2019 1102   APPEARANCEUR Clear 12/27/2019 1429   LABSPEC 1.019 11/03/2019 1102   PHURINE 5.0 11/03/2019 1102   GLUCOSEU Negative 12/27/2019 1429   HGBUR NEGATIVE 11/03/2019 1102   BILIRUBINUR Negative 12/27/2019 1429   KETONESUR NEGATIVE 11/03/2019 1102   PROTEINUR 2+ (A) 12/27/2019 1429   PROTEINUR 100 (A) 11/03/2019 1102   NITRITE Negative 12/27/2019 1429   NITRITE NEGATIVE 11/03/2019 1102   LEUKOCYTESUR Negative 12/27/2019 1429   LEUKOCYTESUR TRACE (A) 11/03/2019 1102    Lab Results  Component Value Date   LABMICR See below: 12/27/2019   WBCUA 0-5 12/27/2019   LABEPIT 0-10 12/27/2019   BACTERIA None seen 12/27/2019    Pertinent Imaging: n/a Results for orders placed during the hospital encounter of 09/27/19  DG Abd 1  View  Narrative CLINICAL DATA:  NG tube placement.  EXAM: ABDOMEN - 1 VIEW  COMPARISON:  None.  FINDINGS: The NG tube tip is seen in the lower chest at the T10 level, likely in the distal esophagus.  Bowel gas pattern is normal. Small amount of contrast in the kidneys and bladder. No acute bone abnormality.  IMPRESSION: NG tube tip in the distal esophagus.   Electronically Signed By: Lorriane Shire M.D. On: 10/01/2019 12:34  No results found for this or any previous visit.  No results found for this or any previous visit.  No results found for this or any previous visit.  No results found for this or any previous visit.  No results found for this or any previous visit.  No results found for this or any previous visit.  No results found for this or any previous visit.   Assessment & Plan:    1. Urgency of micturition - continue darifenacin, She continues tamsulosin as well. Disc again nature r/b of anticholinergics with pt and son Marden Noble. She will stop tamsulosin. F/u in 6 mo with a PVR.   - Urinalysis, Complete   No follow-ups on file.  Festus Aloe, MD  [facility] [address] [place], [place] [address] [phone]

## 2020-08-06 ENCOUNTER — Other Ambulatory Visit: Payer: Self-pay

## 2020-08-06 ENCOUNTER — Encounter: Payer: Self-pay | Admitting: Ophthalmology

## 2020-08-07 ENCOUNTER — Other Ambulatory Visit
Admission: RE | Admit: 2020-08-07 | Discharge: 2020-08-07 | Disposition: A | Discharge status: Home / Self Care | Payer: Medicare HMO | Source: Ambulatory Visit | Attending: Ophthalmology | Admitting: Ophthalmology

## 2020-08-07 DIAGNOSIS — Z20822 Contact with and (suspected) exposure to covid-19: Secondary | ICD-10-CM | POA: Diagnosis not present

## 2020-08-07 DIAGNOSIS — Z01812 Encounter for preprocedural laboratory examination: Secondary | ICD-10-CM | POA: Diagnosis not present

## 2020-08-07 NOTE — Discharge Instructions (Signed)

## 2020-08-07 NOTE — Anesthesia Preprocedure Evaluation (Addendum)
Anesthesia Evaluation  Patient identified by MRN, date of birth, ID band Patient awake    Reviewed: Allergy & Precautions, H&P , NPO status , Patient's Chart, lab work & pertinent test results  Airway Mallampati: II  TM Distance: >3 FB Neck ROM: full    Dental no notable dental hx.    Pulmonary neg pulmonary ROS,    Pulmonary exam normal        Cardiovascular hypertension, On Medications + CAD, + Past MI and +CHF   Rhythm:regular Rate:Normal  TTE 10/02/19: 50 to 55% with LVH (improved from EF 20-25% at time of MI)   Neuro/Psych CVA, No Residual Symptoms negative neurological ROS     GI/Hepatic Neg liver ROS,   Endo/Other  diabetes, Well Controlled, Type 2  Renal/GU      Musculoskeletal   Abdominal   Peds  Hematology  (+) Blood dyscrasia, anemia ,   Anesthesia Other Findings Last cardiology visit 04/22/20: CAD with stable angina Cholesterol at goal HBA1C elevated 7.0, long discussion with her concerning her diet and need for weight loss No anginal sx No further testing at this time  Reproductive/Obstetrics                            Anesthesia Physical Anesthesia Plan  ASA: III  Anesthesia Plan: MAC   Post-op Pain Management:    Induction:   PONV Risk Score and Plan:   Airway Management Planned:   Additional Equipment:   Intra-op Plan:   Post-operative Plan:   Informed Consent: I have reviewed the patients History and Physical, chart, labs and discussed the procedure including the risks, benefits and alternatives for the proposed anesthesia with the patient or authorized representative who has indicated his/her understanding and acceptance.       Plan Discussed with:   Anesthesia Plan Comments:         Anesthesia Quick Evaluation

## 2020-08-08 LAB — SARS CORONAVIRUS 2 (TAT 6-24 HRS): SARS Coronavirus 2: NEGATIVE

## 2020-08-10 ENCOUNTER — Other Ambulatory Visit: Payer: Self-pay | Admitting: Physician Assistant

## 2020-08-10 MED ORDER — DARIFENACIN HYDROBROMIDE ER 15 MG PO TB24: 15.0000 mg | ORAL_TABLET | Freq: Every day | ORAL | 2 refills | Status: DC

## 2020-08-10 NOTE — Progress Notes (Signed)
Patient notified. Patient would like to wait for insurance PA.

## 2020-08-10 NOTE — Progress Notes (Signed)
Please contact the patient and inform her that I have refilled her darifenacin. This should trigger a prior authorization with the pharmacy so we can provide any necessary documentation to her insurance company. Additionally, there is a GoodRx coupon available online that she can use to bring the cost of this medication down at [employer].

## 2020-08-11 ENCOUNTER — Encounter
Admission: RE | Disposition: A | Discharge status: Home / Self Care | Payer: Self-pay | Source: Home / Self Care | Attending: Ophthalmology

## 2020-08-11 ENCOUNTER — Ambulatory Visit: Payer: Medicare HMO | Admitting: Anesthesiology

## 2020-08-11 ENCOUNTER — Ambulatory Visit
Admission: RE | Admit: 2020-08-11 | Discharge: 2020-08-11 | Disposition: A | Discharge status: Home / Self Care | Payer: Medicare HMO | Attending: Ophthalmology | Admitting: Ophthalmology

## 2020-08-11 ENCOUNTER — Encounter: Payer: Self-pay | Admitting: Ophthalmology

## 2020-08-11 ENCOUNTER — Other Ambulatory Visit: Payer: Self-pay

## 2020-08-11 DIAGNOSIS — H25811 Combined forms of age-related cataract, right eye: Secondary | ICD-10-CM | POA: Diagnosis not present

## 2020-08-11 DIAGNOSIS — E1136 Type 2 diabetes mellitus with diabetic cataract: Secondary | ICD-10-CM | POA: Diagnosis not present

## 2020-08-11 DIAGNOSIS — Z7984 Long term (current) use of oral hypoglycemic drugs: Secondary | ICD-10-CM | POA: Diagnosis not present

## 2020-08-11 DIAGNOSIS — Z91018 Allergy to other foods: Secondary | ICD-10-CM | POA: Insufficient documentation

## 2020-08-11 DIAGNOSIS — H2511 Age-related nuclear cataract, right eye: Secondary | ICD-10-CM | POA: Diagnosis not present

## 2020-08-11 DIAGNOSIS — Z79899 Other long term (current) drug therapy: Secondary | ICD-10-CM | POA: Diagnosis not present

## 2020-08-11 DIAGNOSIS — Z87892 Personal history of anaphylaxis: Secondary | ICD-10-CM | POA: Diagnosis not present

## 2020-08-11 DIAGNOSIS — Z7902 Long term (current) use of antithrombotics/antiplatelets: Secondary | ICD-10-CM | POA: Insufficient documentation

## 2020-08-11 HISTORY — DX: Weakness: R53.1

## 2020-08-11 HISTORY — DX: Other amnesia: R41.3

## 2020-08-11 HISTORY — DX: Dependence on other enabling machines and devices: Z99.89

## 2020-08-11 HISTORY — DX: Type 2 diabetes mellitus without complications: E11.9

## 2020-08-11 HISTORY — PX: CATARACT EXTRACTION W/PHACO: SHX586

## 2020-08-11 HISTORY — DX: Presence of dental prosthetic device (complete) (partial): Z97.2

## 2020-08-11 HISTORY — DX: Essential (primary) hypertension: I10

## 2020-08-11 LAB — GLUCOSE, CAPILLARY
Glucose-Capillary: 123 mg/dL — ABNORMAL HIGH (ref 70–99)
Glucose-Capillary: 144 mg/dL — ABNORMAL HIGH (ref 70–99)

## 2020-08-11 SURGERY — PHACOEMULSIFICATION, CATARACT, WITH IOL INSERTION
Anesthesia: Monitor Anesthesia Care | Site: Eye | Laterality: Right

## 2020-08-11 MED ORDER — FENTANYL CITRATE (PF) 100 MCG/2ML IJ SOLN
INTRAMUSCULAR | Status: DC | PRN
  Administered 2020-08-11: 50 ug via INTRAVENOUS

## 2020-08-11 MED ORDER — BRIMONIDINE TARTRATE-TIMOLOL 0.2-0.5 % OP SOLN
OPHTHALMIC | Status: DC | PRN
  Administered 2020-08-11: 1 [drp] via OPHTHALMIC

## 2020-08-11 MED ORDER — NA CHONDROIT SULF-NA HYALURON 40-17 MG/ML IO SOLN
INTRAOCULAR | Status: DC | PRN
  Administered 2020-08-11: 1 mL via INTRAOCULAR

## 2020-08-11 MED ORDER — LIDOCAINE HCL (PF) 2 % IJ SOLN
INTRAOCULAR | Status: DC | PRN
  Administered 2020-08-11: 1 mL

## 2020-08-11 MED ORDER — TETRACAINE HCL 0.5 % OP SOLN
1.0000 [drp] | OPHTHALMIC | Status: DC | PRN
  Administered 2020-08-11 (×3): 1 [drp] via OPHTHALMIC

## 2020-08-11 MED ORDER — EPINEPHRINE PF 1 MG/ML IJ SOLN
INTRAOCULAR | Status: DC | PRN
  Administered 2020-08-11: 63 mL via OPHTHALMIC

## 2020-08-11 MED ORDER — ARMC OPHTHALMIC DILATING DROPS
1.0000 "application " | OPHTHALMIC | Status: DC | PRN
  Administered 2020-08-11 (×3): 1 via OPHTHALMIC

## 2020-08-11 MED ORDER — MOXIFLOXACIN HCL 0.5 % OP SOLN
OPHTHALMIC | Status: DC | PRN
  Administered 2020-08-11: 0.2 mL via OPHTHALMIC

## 2020-08-11 MED ORDER — LACTATED RINGERS IV SOLN: INTRAVENOUS | Status: DC

## 2020-08-11 MED ORDER — MIDAZOLAM HCL 2 MG/2ML IJ SOLN
INTRAMUSCULAR | Status: DC | PRN
  Administered 2020-08-11: 2 mg via INTRAVENOUS

## 2020-08-11 SURGICAL SUPPLY — 19 items
CANNULA ANT/CHMB 27G (MISCELLANEOUS) ×2 IMPLANT
CANNULA ANT/CHMB 27GA (MISCELLANEOUS) ×4 IMPLANT
GLOVE SURG LX 8.0 MICRO (GLOVE) ×1
GLOVE SURG LX STRL 8.0 MICRO (GLOVE) ×1 IMPLANT
GLOVE SURG TRIUMPH 8.0 PF LTX (GLOVE) ×2 IMPLANT
GOWN STRL REUS W/ TWL LRG LVL3 (GOWN DISPOSABLE) ×2 IMPLANT
GOWN STRL REUS W/TWL LRG LVL3 (GOWN DISPOSABLE) ×4
LENS IOL TECNIS EYHANCE 14.5 (Intraocular Lens) ×1 IMPLANT
MARKER SKIN DUAL TIP RULER LAB (MISCELLANEOUS) ×2 IMPLANT
NDL FILTER BLUNT 18X1 1/2 (NEEDLE) ×1 IMPLANT
NEEDLE FILTER BLUNT 18X 1/2SAF (NEEDLE) ×1
NEEDLE FILTER BLUNT 18X1 1/2 (NEEDLE) ×1 IMPLANT
PACK EYE AFTER SURG (MISCELLANEOUS) ×2 IMPLANT
PACK OPTHALMIC (MISCELLANEOUS) ×2 IMPLANT
PACK PORFILIO (MISCELLANEOUS) ×2 IMPLANT
SYR 3ML LL SCALE MARK (SYRINGE) ×2 IMPLANT
SYR TB 1ML LUER SLIP (SYRINGE) ×2 IMPLANT
WATER STERILE IRR 250ML POUR (IV SOLUTION) ×2 IMPLANT
WIPE NON LINTING 3.25X3.25 (MISCELLANEOUS) ×2 IMPLANT

## 2020-08-11 NOTE — Anesthesia Procedure Notes (Signed)
Procedure Name: MAC Date/Time: 08/11/2020 7:35 AM Performed by: Jeannene Patella, CRNA Pre-anesthesia Checklist: Patient identified, Emergency Drugs available, Suction available, Timeout performed and Patient being monitored Patient Re-evaluated:Patient Re-evaluated prior to induction Oxygen Delivery Method: Nasal cannula Placement Confirmation: positive ETCO2

## 2020-08-11 NOTE — Op Note (Signed)
PREOPERATIVE DIAGNOSIS:  Nuclear sclerotic cataract of the right eye.   POSTOPERATIVE DIAGNOSIS:  H25.11 Cataract   OPERATIVE PROCEDURE:@   SURGEON:  Galen Manila, MD.   ANESTHESIA:  Anesthesiologist: Jolayne Panther, MD CRNA: Jinny Blossom, CRNA  1.      Managed anesthesia care. 2.      0.21ml of Shugarcaine was instilled in the eye following the paracentesis.   COMPLICATIONS:  None.   TECHNIQUE:   Stop and chop   DESCRIPTION OF PROCEDURE:  The patient was examined and consented in the preoperative holding area where the aforementioned topical anesthesia was applied to the right eye and then brought back to the Operating Room where the right eye was prepped and draped in the usual sterile ophthalmic fashion and a lid speculum was placed. A paracentesis was created with the side port blade and the anterior chamber was filled with viscoelastic. A near clear corneal incision was performed with the steel keratome. A continuous curvilinear capsulorrhexis was performed with a cystotome followed by the capsulorrhexis forceps. Hydrodissection and hydrodelineation were carried out with BSS on a blunt cannula. The lens was removed in a stop and chop  technique and the remaining cortical material was removed with the irrigation-aspiration handpiece. The capsular bag was inflated with viscoelastic and the Technis [id number]  lens was placed in the capsular bag without complication. The remaining viscoelastic was removed from the eye with the irrigation-aspiration handpiece. The wounds were hydrated. The anterior chamber was flushed with BSS and the eye was inflated to physiologic pressure. 0.8ml of Vigamox was placed in the anterior chamber. The wounds were found to be water tight. The eye was dressed with Combigan. The patient was given protective glasses to wear throughout the day and a shield with which to sleep tonight. The patient was also given drops with which to begin a drop regimen today and  will follow-up with me in one day. Implant Name Type Inv. Item Serial No. Manufacturer Lot No. LRB No. Used Action  LENS IOL TECNIS EYHANCE 14.5 - [id number] Intraocular Lens LENS IOL TECNIS EYHANCE 14.5 [id number] JOHNSON   Right 1 Implanted   Procedure(s) with comments: CATARACT EXTRACTION PHACO AND INTRAOCULAR LENS PLACEMENT (IOC) RIGHT DIABETIC 6.42 00:44.0 (Right) - Diabetic - oral meds  Electronically signed: Galen Manila 08/11/2020 7:47 AM

## 2020-08-11 NOTE — H&P (Signed)
[facility]   Primary Care Physician:  Kirk Ruths, MD Ophthalmologist: Dr. George Ina  Pre-Procedure History & Physical: HPI:  Brittney Kennedy is a 70 y.o. female here for cataract surgery.   Past Medical History:  Diagnosis Date  . CAD (coronary artery disease)    a. 09/2019 NSTEMI/Cath: LM nl, LAD 90p, 27m diffuse mid-dist dzs, LCX 96mV groove/OM2, RI 90p, RCA dominant, nl, RPDA 9014mF 25%, glob HK.  . Diabetes mellitus, type 2 ([place]CC[place] . HFrEF (heart failure with reduced ejection fraction) ([place]CC[place]  a. 09/2019 LV gram: EF 25%, global HK.  . HMarland Kitchenpertension   . Memory impairment    S/P stroke 09/2019  . Myocardial infarction ([place]CC[place]3/20[place]  . PUD (peptic ulcer disease)   . Seasonal allergies   . Stroke ([place]CC[place]  . Walker as ambulation aid   . Weakness of left side of body    S/P stroke 09/2019  . Wears dentures    full upper and lower    Past Surgical History:  Procedure Laterality Date  . CHOLECYSTECTOMY    . CORONARY STENT INTERVENTION N/A 09/30/2019   Procedure: CORONARY STENT INTERVENTION;  Surgeon: JorMartiniqueeter M, MD;  [facility] [place] [facility];  Service: Cardiovascular;  Laterality: N/A;  . ESOPHAGOGASTRODUODENOSCOPY (EGD) WITH PROPOFOL N/A 10/08/2019   Procedure: ESOPHAGOGASTRODUODENOSCOPY (EGD) WITH PROPOFOL;  Surgeon: ArmYetta FlockD;  [facility];  Laterality: N/A;  . HEMOSTASIS CLIP PLACEMENT  10/08/2019   Procedure: HEMOSTASIS CLIP PLACEMENT;  Surgeon: ArmYetta FlockD;  [facility] [place];;  . HEMOSTASIS CONTROL  10/08/2019   Procedure: HEMOSTASIS CONTROL;  Surgeon: ArmYetta FlockD;  [facility] [place];;  . LEFT HEART CATH AND CORONARY ANGIOGRAPHY N/A 09/30/2019   Procedure: LEFT HEART CATH AND CORONARY ANGIOGRAPHY;  Surgeon: JorMartiniqueeter M, MD;  [facility] [place] [facility];  Service: Cardiovascular;  Laterality: N/A;  .  LEFT HEART CATH AND CORS/GRAFTS ANGIOGRAPHY N/A 09/27/2019   Procedure: LEFT HEART CATH AND CORS/GRAFTS ANGIOGRAPHY;  Surgeon: GolMinna MerrittsD;  [facility];  Service: Cardiovascular;  Laterality: N/A;    Prior to Admission medications   Medication Sig Start Date End Date Taking? Authorizing Provider  acetaminophen (TYLENOL) 325 MG tablet Take 2 tablets (650 mg total) by mouth every 4 (four) hours as needed for mild pain or headache. 11/13/19  Yes Angiulli, DanLavon PaganiniA-C  atorvastatin (LIPITOR) 80 MG tablet Take 1 tablet (80 mg total) by mouth daily at 6 PM. 12/11/19  Yes Bensimhon, DanShaune PascalD  Calcium Carbonate-Vit D-Min (CALCIUM-VITAMIN D-MINERALS) 600-800 MG-UNIT CHEW Chew by mouth.   Yes [provider]  carvedilol (COREG) 25 MG tablet Take 1 tablet (25 mg total) by mouth 2 (two) times daily. 01/31/20 04/30/20 Yes Visser, Jacquelyn D, PA-C  clopidogrel (PLAVIX) 75 MG tablet Take 1 tablet (75 mg total) by mouth daily with breakfast. 12/11/19  Yes Bensimhon, DanShaune PascalD  darifenacin (ENABLEX) 15 MG 24 hr tablet Take 1 tablet (15 mg total) by mouth at bedtime. 08/10/20  Yes Vaillancourt, Samantha, PA-C  diclofenac Sodium (VOLTAREN) 1 % GEL Apply 2 g topically daily as needed.   Yes [provider]  docusate sodium (COLACE) 100 MG capsule Take 100 mg by mouth 2 (two) times daily.   Yes [provider]  glipiZIDE (GLUCOTROL) 5 MG tablet Take by mouth. 06/19/20 06/19/21 Yes [provider]  hydrocortisone cream 1 % Apply 1 application topically 2 (two) times daily as needed for itching.   Yes [provider]  losartan (COZAAR) 100 MG tablet Take 1 tablet (100 mg total) by mouth daily. 12/11/19  Yes Bensimhon, Shaune Pascal, MD  magnesium gluconate (MAGONATE) 500 MG tablet Take 500 mg by mouth every 8 (eight) hours.   Yes [provider]  Melatonin 10 MG CAPS Take 20 mg by mouth daily as needed.    Yes [provider]   metFORMIN (GLUCOPHAGE) 500 MG tablet Take 1 tablet (500 mg total) by mouth daily with breakfast. Patient taking differently: Take 500 mg by mouth 2 (two) times daily with a meal. 12/11/19  Yes Bensimhon, Shaune Pascal, MD  Multiple Vitamin (MULTIVITAMIN WITH MINERALS) TABS tablet Take 1 tablet by mouth daily.   Yes [provider]  nitroGLYCERIN (NITROSTAT) 0.4 MG SL tablet Place 1 tablet (0.4 mg total) under the tongue every 5 (five) minutes as needed for chest pain. 11/13/19  Yes Angiulli, Lavon Paganini, PA-C  pantoprazole (PROTONIX) 40 MG tablet Take 1 tablet (40 mg total) by mouth daily before breakfast. 04/20/20  Yes Irene Shipper, MD    Allergies as of 04/22/2020 - Review Complete 04/22/2020  Allergen Reaction Noted  . Onion Anaphylaxis and Hives 10/09/2019  . Monosodium glutamate Nausea Only 10/09/2019    Family History  Problem Relation Age of Onset  . Stroke Mother        died @ 44  . Multiple myeloma Father        died @ 59  . Multiple myeloma Sister     Social History   Socioeconomic History  . Marital status: Divorced    Spouse name: Not on file  . Number of children: 1  . Years of education: Not on file  . Highest education level: Not on file  Occupational History  . Not on file  Tobacco Use  . Smoking status: Never Smoker  . Smokeless tobacco: Never Used  Vaping Use  . Vaping Use: Never used  Substance and Sexual Activity  . Alcohol use: Never  . Drug use: Never  . Sexual activity: Not Currently  Other Topics Concern  . Not on file  Social History Narrative  . Not on file   Social Determinants of Health   Financial Resource Strain: Not on file  Food Insecurity: Not on file  Transportation Needs: Not on file  Physical Activity: Not on file  Stress: Not on file  Social Connections: Not on file  Intimate Partner Violence: Not on file    Review of Systems: See HPI, otherwise negative ROS  Physical Exam: BP (!) 151/63   Pulse 60   Temp 98.2 F  (36.8 C) (Temporal)   Resp 18   Ht 5' 2"  (1.575 m)   Wt 75.8 kg   SpO2 99%   BMI 30.54 kg/m  General:   Alert,  pleasant and cooperative in NAD Head:  Normocephalic and atraumatic. Respiratory:  Normal work of breathing.  Impression/Plan: Brittney Kennedy is here for cataract surgery.  Risks, benefits, limitations, and alternatives regarding cataract surgery have been reviewed with the patient.  Questions have been answered.  All parties agreeable.   Birder Robson, MD  08/11/2020, 7:21 AM

## 2020-08-11 NOTE — Anesthesia Postprocedure Evaluation (Signed)
Anesthesia Post Note  Patient: Brittney Kennedy  Procedure(s) Performed: CATARACT EXTRACTION PHACO AND INTRAOCULAR LENS PLACEMENT (IOC) RIGHT DIABETIC 6.42 00:44.0 (Right Eye)     Patient location during evaluation: PACU Anesthesia Type: MAC Level of consciousness: awake and alert Pain management: pain level controlled Vital Signs Assessment: post-procedure vital signs reviewed and stable Respiratory status: spontaneous breathing Cardiovascular status: stable Anesthetic complications: no   No complications documented.  Gillian Scarce

## 2020-08-11 NOTE — Transfer of Care (Signed)
Immediate Anesthesia Transfer of Care Note  Patient: Brittney Kennedy  Procedure(s) Performed: CATARACT EXTRACTION PHACO AND INTRAOCULAR LENS PLACEMENT (IOC) RIGHT DIABETIC 6.42 00:44.0 (Right Eye)  Patient Location: PACU  Anesthesia Type: MAC  Level of Consciousness: awake, alert  and patient cooperative  Airway and Oxygen Therapy: Patient Spontanous Breathing and Patient connected to supplemental oxygen  Post-op Assessment: Post-op Vital signs reviewed, Patient's Cardiovascular Status Stable, Respiratory Function Stable, Patent Airway and No signs of Nausea or vomiting  Post-op Vital Signs: Reviewed and stable  Complications: No complications documented.

## 2020-08-12 ENCOUNTER — Encounter: Payer: Self-pay | Admitting: Ophthalmology

## 2020-08-14 ENCOUNTER — Telehealth: Payer: Self-pay

## 2020-08-14 DIAGNOSIS — R350 Frequency of micturition: Secondary | ICD-10-CM

## 2020-08-14 DIAGNOSIS — R3915 Urgency of urination: Secondary | ICD-10-CM

## 2020-08-14 MED ORDER — DARIFENACIN HYDROBROMIDE ER 15 MG PO TB24: 15.0000 mg | ORAL_TABLET | Freq: Every day | ORAL | 2 refills | Status: DC

## 2020-08-14 NOTE — Telephone Encounter (Signed)
Incoming call on triage line from pt's son who is POA requesting that the pt's RX for Enablex be sent to mail order pharmacy due to insurance. RX sent to [facility] mail order.

## 2020-09-02 ENCOUNTER — Telehealth: Payer: Self-pay

## 2020-09-02 DIAGNOSIS — R3915 Urgency of urination: Secondary | ICD-10-CM

## 2020-09-02 NOTE — Telephone Encounter (Signed)
Incoming message on triage voicemail in regards to an Rx being denied. Returned call and [facility] for patient to return call to figure out denial.

## 2020-09-03 MED ORDER — OXYBUTYNIN CHLORIDE ER 10 MG PO TB24: 10.0000 mg | ORAL_TABLET | Freq: Every day | ORAL | 0 refills | Status: DC

## 2020-09-03 NOTE — Telephone Encounter (Signed)
[facility] notifying patient. Instructed patient to return call should they have any questions.

## 2020-09-03 NOTE — Telephone Encounter (Signed)
Incoming call on triage line from pt's son stating that he received a denial letter from [facility] for [place], pending PA and more information from [place]. He states that Oxybutynin is listed as an alternative on letter.

## 2020-09-03 NOTE — Telephone Encounter (Signed)
Oxybutynin XL 10mg  daily sent to [facility] mail order pharmacy.

## 2020-09-07 DIAGNOSIS — Z961 Presence of intraocular lens: Secondary | ICD-10-CM | POA: Diagnosis not present

## 2020-09-07 DIAGNOSIS — R6889 Other general symptoms and signs: Secondary | ICD-10-CM | POA: Diagnosis not present

## 2020-09-29 DIAGNOSIS — R6889 Other general symptoms and signs: Secondary | ICD-10-CM | POA: Diagnosis not present

## 2020-09-29 DIAGNOSIS — E118 Type 2 diabetes mellitus with unspecified complications: Secondary | ICD-10-CM | POA: Diagnosis not present

## 2020-09-29 DIAGNOSIS — I5022 Chronic systolic (congestive) heart failure: Secondary | ICD-10-CM | POA: Diagnosis not present

## 2020-09-29 DIAGNOSIS — I2510 Atherosclerotic heart disease of native coronary artery without angina pectoris: Secondary | ICD-10-CM | POA: Diagnosis not present

## 2020-10-14 DIAGNOSIS — H16209 Unspecified keratoconjunctivitis, unspecified eye: Secondary | ICD-10-CM | POA: Diagnosis not present

## 2020-10-14 DIAGNOSIS — I5021 Acute systolic (congestive) heart failure: Secondary | ICD-10-CM | POA: Diagnosis not present

## 2020-10-14 DIAGNOSIS — I63511 Cerebral infarction due to unspecified occlusion or stenosis of right middle cerebral artery: Secondary | ICD-10-CM | POA: Diagnosis not present

## 2020-10-14 DIAGNOSIS — E876 Hypokalemia: Secondary | ICD-10-CM | POA: Diagnosis not present

## 2020-10-14 DIAGNOSIS — R7309 Other abnormal glucose: Secondary | ICD-10-CM | POA: Diagnosis not present

## 2020-10-14 DIAGNOSIS — I10 Essential (primary) hypertension: Secondary | ICD-10-CM | POA: Diagnosis not present

## 2020-10-14 DIAGNOSIS — I749 Embolism and thrombosis of unspecified artery: Secondary | ICD-10-CM | POA: Diagnosis not present

## 2020-10-14 DIAGNOSIS — R0989 Other specified symptoms and signs involving the circulatory and respiratory systems: Secondary | ICD-10-CM | POA: Diagnosis not present

## 2020-11-13 DIAGNOSIS — I2510 Atherosclerotic heart disease of native coronary artery without angina pectoris: Secondary | ICD-10-CM | POA: Diagnosis not present

## 2020-11-13 DIAGNOSIS — E118 Type 2 diabetes mellitus with unspecified complications: Secondary | ICD-10-CM | POA: Diagnosis not present

## 2020-11-13 DIAGNOSIS — I700 Atherosclerosis of aorta: Secondary | ICD-10-CM | POA: Diagnosis not present

## 2020-11-13 DIAGNOSIS — R6889 Other general symptoms and signs: Secondary | ICD-10-CM | POA: Diagnosis not present

## 2020-11-13 DIAGNOSIS — E876 Hypokalemia: Secondary | ICD-10-CM | POA: Diagnosis not present

## 2020-11-13 DIAGNOSIS — I5021 Acute systolic (congestive) heart failure: Secondary | ICD-10-CM | POA: Diagnosis not present

## 2020-11-13 DIAGNOSIS — R0781 Pleurodynia: Secondary | ICD-10-CM | POA: Diagnosis not present

## 2020-11-13 DIAGNOSIS — W010XXA Fall on same level from slipping, tripping and stumbling without subsequent striking against object, initial encounter: Secondary | ICD-10-CM | POA: Diagnosis not present

## 2020-11-13 DIAGNOSIS — I63511 Cerebral infarction due to unspecified occlusion or stenosis of right middle cerebral artery: Secondary | ICD-10-CM | POA: Diagnosis not present

## 2020-11-13 DIAGNOSIS — M25551 Pain in right hip: Secondary | ICD-10-CM | POA: Diagnosis not present

## 2020-11-13 DIAGNOSIS — I749 Embolism and thrombosis of unspecified artery: Secondary | ICD-10-CM | POA: Diagnosis not present

## 2020-11-13 DIAGNOSIS — I5022 Chronic systolic (congestive) heart failure: Secondary | ICD-10-CM | POA: Diagnosis not present

## 2020-11-13 DIAGNOSIS — R0989 Other specified symptoms and signs involving the circulatory and respiratory systems: Secondary | ICD-10-CM | POA: Diagnosis not present

## 2020-11-13 DIAGNOSIS — I10 Essential (primary) hypertension: Secondary | ICD-10-CM | POA: Diagnosis not present

## 2020-11-13 DIAGNOSIS — H16209 Unspecified keratoconjunctivitis, unspecified eye: Secondary | ICD-10-CM | POA: Diagnosis not present

## 2020-11-13 DIAGNOSIS — R7309 Other abnormal glucose: Secondary | ICD-10-CM | POA: Diagnosis not present

## 2020-11-22 ENCOUNTER — Other Ambulatory Visit: Payer: Self-pay

## 2020-11-22 DIAGNOSIS — R3915 Urgency of urination: Secondary | ICD-10-CM

## 2020-11-24 MED ORDER — OXYBUTYNIN CHLORIDE ER 10 MG PO TB24: 10.0000 mg | ORAL_TABLET | Freq: Every day | ORAL | 0 refills | Status: DC

## 2020-12-14 DIAGNOSIS — R7309 Other abnormal glucose: Secondary | ICD-10-CM | POA: Diagnosis not present

## 2020-12-14 DIAGNOSIS — I63511 Cerebral infarction due to unspecified occlusion or stenosis of right middle cerebral artery: Secondary | ICD-10-CM | POA: Diagnosis not present

## 2020-12-14 DIAGNOSIS — E876 Hypokalemia: Secondary | ICD-10-CM | POA: Diagnosis not present

## 2020-12-14 DIAGNOSIS — I749 Embolism and thrombosis of unspecified artery: Secondary | ICD-10-CM | POA: Diagnosis not present

## 2020-12-14 DIAGNOSIS — I5021 Acute systolic (congestive) heart failure: Secondary | ICD-10-CM | POA: Diagnosis not present

## 2020-12-14 DIAGNOSIS — H16209 Unspecified keratoconjunctivitis, unspecified eye: Secondary | ICD-10-CM | POA: Diagnosis not present

## 2020-12-14 DIAGNOSIS — I10 Essential (primary) hypertension: Secondary | ICD-10-CM | POA: Diagnosis not present

## 2020-12-14 DIAGNOSIS — R0989 Other specified symptoms and signs involving the circulatory and respiratory systems: Secondary | ICD-10-CM | POA: Diagnosis not present

## 2020-12-24 ENCOUNTER — Ambulatory Visit: Payer: Medicare Other | Admitting: Physician Assistant

## 2020-12-28 ENCOUNTER — Ambulatory Visit (INDEPENDENT_AMBULATORY_CARE_PROVIDER_SITE_OTHER): Payer: Medicare HMO | Admitting: Physician Assistant

## 2020-12-28 ENCOUNTER — Other Ambulatory Visit: Payer: Self-pay

## 2020-12-28 ENCOUNTER — Encounter: Payer: Self-pay | Admitting: Physician Assistant

## 2020-12-28 VITALS — BP 140/75 | HR 58 | Ht 63.0 in | Wt 173.0 lb

## 2020-12-28 DIAGNOSIS — R3915 Urgency of urination: Secondary | ICD-10-CM | POA: Diagnosis not present

## 2020-12-28 DIAGNOSIS — R6889 Other general symptoms and signs: Secondary | ICD-10-CM | POA: Diagnosis not present

## 2020-12-28 LAB — BLADDER SCAN AMB NON-IMAGING

## 2020-12-28 MED ORDER — OXYBUTYNIN CHLORIDE ER 10 MG PO TB24: 10.0000 mg | ORAL_TABLET | Freq: Every day | ORAL | 11 refills | Status: DC

## 2020-12-28 NOTE — Progress Notes (Signed)
12/28/2020 4:14 PM   Brittney Kennedy 11-Dec-1950 [id number]  CC: Chief Complaint  Patient presents with   Follow-up   HPI: Brittney Kennedy is a 70 y.o. female with PMH OAB wet and microscopic hematuria who presents today for symptom recheck on oxybutynin XL 10 mg daily.  She was previously also on Flomax, which has been discontinued.  Today she reports symptomatic improvement on oxybutynin.  She describes urinary frequency and nocturia approximately every 2 hours as well as rare urge incontinence if she delays voiding.    She wishes to continue the medication.  She reports constipation following her stroke, unsure if worse on oxybutynin.  She denies bothersome dry mouth and dry eye.  PVR 4 mL.  PMH: Past Medical History:  Diagnosis Date   CAD (coronary artery disease)    a. 09/2019 NSTEMI/Cath: LM nl, LAD 90p, 99m diffuse mid-dist dzs, LCX 961mV groove/OM2, RI 90p, RCA dominant, nl, RPDA 9040mF 25%, glob HK.   Diabetes mellitus, type 2 (HCC)    HFrEF (heart failure with reduced ejection fraction) ([place]  a. 09/2019 LV gram: EF 25%, global HK.   Hypertension    Memory impairment    S/P stroke 09/2019   [facility]   PUD (peptic ulcer disease)    Seasonal allergies    Stroke ([place]CC[place]3/20[place]   Walker as ambulation aid    Weakness of left side of body    S/P stroke 09/2019   Wears dentures    full upper and lower    Surgical History: Past Surgical History:  Procedure Laterality Date   CATARACT EXTRACTION W/PHACO Right 08/11/2020   Procedure: CATARACT EXTRACTION PHACO AND INTRAOCULAR LENS PLACEMENT (IOCHannaIGHT DIABETIC 6.42 00:44.0;  Surgeon: PorBirder RobsonD;  [facility];  Laterality: Right;  Diabetic - oral meds   CHOLECYSTECTOMY     CORONARY STENT INTERVENTION N/A 09/30/2019   Procedure: CORONARY STENT INTERVENTION;  Surgeon: JorMartiniqueeter M, MD;  [facility] [place] [facility];  Service: Cardiovascular;   Laterality: N/A;   ESOPHAGOGASTRODUODENOSCOPY (EGD) WITH PROPOFOL N/A 10/08/2019   Procedure: ESOPHAGOGASTRODUODENOSCOPY (EGD) WITH PROPOFOL;  Surgeon: ArmYetta FlockD;  [facility];  Laterality: N/A;   HEMOSTASIS CLIP PLACEMENT  10/08/2019   Procedure: HEMOSTASIS CLIP PLACEMENT;  Surgeon: ArmYetta FlockD;  [facility] [place];;   HEMOSTASIS CONTROL  10/08/2019   Procedure: HEMOSTASIS CONTROL;  Surgeon: ArmYetta FlockD;  [facility] [place];;   LEFT HEART CATH AND CORONARY ANGIOGRAPHY N/A 09/30/2019   Procedure: LEFT HEART CATH AND CORONARY ANGIOGRAPHY;  Surgeon: JorMartiniqueeter M, MD;  [facility] [place] [facility];  Service: Cardiovascular;  Laterality: N/A;   LEFT HEART CATH AND CORS/GRAFTS ANGIOGRAPHY N/A 09/27/2019   Procedure: LEFT HEART CATH AND CORS/GRAFTS ANGIOGRAPHY;  Surgeon: GolMinna MerrittsD;  [facility];  Service: Cardiovascular;  Laterality: N/A;    Home Medications:  Allergies as of 12/28/2020       Reactions   Onion Anaphylaxis, Hives   (Raw onions)   Monosodium Glutamate Nausea Only   Headache Fatigue        Medication List        Accurate as of December 28, 2020  4:14 PM. If you have any questions, ask your nurse or doctor.          acetaminophen 325 MG tablet Commonly known as: TYLENOL  Take 2 tablets (650 mg total) by mouth every 4 (four) hours as needed for mild pain or headache.   atorvastatin 80 MG tablet Commonly known as: LIPITOR Take 1 tablet (80 mg total) by mouth daily at 6 PM.   Calcium-Vitamin D-Minerals 600-800 MG-UNIT Chew Chew by mouth.   carvedilol 25 MG tablet Commonly known as: COREG Take 1 tablet (25 mg total) by mouth 2 (two) times daily.   clopidogrel 75 MG tablet Commonly known as: PLAVIX Take 1 tablet (75 mg total) by mouth daily with breakfast.   diclofenac Sodium 1 % Gel Commonly known  as: VOLTAREN Apply 2 g topically daily as needed.   docusate sodium 100 MG capsule Commonly known as: COLACE Take 100 mg by mouth 2 (two) times daily.   glipiZIDE 5 MG tablet Commonly known as: GLUCOTROL Take by mouth.   hydrocortisone cream 1 % Apply 1 application topically 2 (two) times daily as needed for itching.   losartan 100 MG tablet Commonly known as: COZAAR Take 1 tablet (100 mg total) by mouth daily.   magnesium gluconate 500 MG tablet Commonly known as: MAGONATE Take 500 mg by mouth every 8 (eight) hours.   Melatonin 10 MG Caps Take 20 mg by mouth daily as needed.   metFORMIN 500 MG tablet Commonly known as: GLUCOPHAGE Take 1 tablet (500 mg total) by mouth daily with breakfast. What changed: when to take this   multivitamin with minerals Tabs tablet Take 1 tablet by mouth daily.   nitroGLYCERIN 0.4 MG SL tablet Commonly known as: NITROSTAT Place 1 tablet (0.4 mg total) under the tongue every 5 (five) minutes as needed for chest pain.   oxybutynin 10 MG 24 hr tablet Commonly known as: DITROPAN-XL Take 1 tablet (10 mg total) by mouth daily.   pantoprazole 40 MG tablet Commonly known as: PROTONIX Take 1 tablet (40 mg total) by mouth daily before breakfast.        Allergies:  Allergies  Allergen Reactions   Onion Anaphylaxis and Hives    (Raw onions)   Monosodium Glutamate Nausea Only    Headache Fatigue    Family History: Family History  Problem Relation Age of Onset   Stroke Mother        died @ 74   Multiple myeloma Father        died @ 17   Multiple myeloma Sister     Social History:   reports that she has never smoked. She has never used smokeless tobacco. She reports that she does not drink alcohol and does not use drugs.  Physical Exam: BP 140/75   Pulse (!) 58   Ht 5' 3"  (1.6 m)   Wt 173 lb (78.5 kg)   BMI 30.65 kg/m   Constitutional:  Alert and oriented, no acute distress, nontoxic appearing HEENT: [place],  AT Cardiovascular: No clubbing, cyanosis, or edema Respiratory: Normal respiratory effort, no increased work of breathing Skin: No rashes, bruises or suspicious lesions Neurologic: Grossly intact, no focal deficits, moving all 4 extremities Psychiatric: Normal mood and affect  Laboratory Data: Results for orders placed or performed in visit on 12/28/20  Bladder Scan (Post Void Residual) in office  Result Value Ref Range   Scan Result 65m    Assessment & Plan:   1. Urgency of micturition Symptomatic improvement on oxybutynin XL 10 mg daily.  Patient is pleased with her progress and no obvious side effects of this medication.  Okay to continue.  We will plan for  symptom recheck with PVR and UA in 1 year. - Bladder Scan (Post Void Residual) in office - oxybutynin (DITROPAN-XL) 10 MG 24 hr tablet; Take 1 tablet (10 mg total) by mouth daily.  Dispense: 30 tablet; Refill: 11  Return in about 1 year (around 12/28/2021) for Symptom recheck with PVR and UA.  Debroah Loop, PA-C  [facility] [address], [place], [place] [address] [phone]

## 2021-01-13 DIAGNOSIS — I749 Embolism and thrombosis of unspecified artery: Secondary | ICD-10-CM | POA: Diagnosis not present

## 2021-01-13 DIAGNOSIS — R7309 Other abnormal glucose: Secondary | ICD-10-CM | POA: Diagnosis not present

## 2021-01-13 DIAGNOSIS — I5021 Acute systolic (congestive) heart failure: Secondary | ICD-10-CM | POA: Diagnosis not present

## 2021-01-13 DIAGNOSIS — E876 Hypokalemia: Secondary | ICD-10-CM | POA: Diagnosis not present

## 2021-01-13 DIAGNOSIS — I63511 Cerebral infarction due to unspecified occlusion or stenosis of right middle cerebral artery: Secondary | ICD-10-CM | POA: Diagnosis not present

## 2021-01-13 DIAGNOSIS — R0989 Other specified symptoms and signs involving the circulatory and respiratory systems: Secondary | ICD-10-CM | POA: Diagnosis not present

## 2021-01-13 DIAGNOSIS — H16209 Unspecified keratoconjunctivitis, unspecified eye: Secondary | ICD-10-CM | POA: Diagnosis not present

## 2021-01-13 DIAGNOSIS — I10 Essential (primary) hypertension: Secondary | ICD-10-CM | POA: Diagnosis not present

## 2021-01-28 DIAGNOSIS — E118 Type 2 diabetes mellitus with unspecified complications: Secondary | ICD-10-CM | POA: Diagnosis not present

## 2021-01-28 DIAGNOSIS — I2510 Atherosclerotic heart disease of native coronary artery without angina pectoris: Secondary | ICD-10-CM | POA: Diagnosis not present

## 2021-01-28 DIAGNOSIS — Z0000 Encounter for general adult medical examination without abnormal findings: Secondary | ICD-10-CM | POA: Diagnosis not present

## 2021-01-28 DIAGNOSIS — I5022 Chronic systolic (congestive) heart failure: Secondary | ICD-10-CM | POA: Diagnosis not present

## 2021-01-28 DIAGNOSIS — R6889 Other general symptoms and signs: Secondary | ICD-10-CM | POA: Diagnosis not present

## 2021-02-13 DIAGNOSIS — I5021 Acute systolic (congestive) heart failure: Secondary | ICD-10-CM | POA: Diagnosis not present

## 2021-02-13 DIAGNOSIS — R0989 Other specified symptoms and signs involving the circulatory and respiratory systems: Secondary | ICD-10-CM | POA: Diagnosis not present

## 2021-02-13 DIAGNOSIS — I10 Essential (primary) hypertension: Secondary | ICD-10-CM | POA: Diagnosis not present

## 2021-02-13 DIAGNOSIS — I63511 Cerebral infarction due to unspecified occlusion or stenosis of right middle cerebral artery: Secondary | ICD-10-CM | POA: Diagnosis not present

## 2021-02-13 DIAGNOSIS — R7309 Other abnormal glucose: Secondary | ICD-10-CM | POA: Diagnosis not present

## 2021-02-13 DIAGNOSIS — I749 Embolism and thrombosis of unspecified artery: Secondary | ICD-10-CM | POA: Diagnosis not present

## 2021-02-13 DIAGNOSIS — E876 Hypokalemia: Secondary | ICD-10-CM | POA: Diagnosis not present

## 2021-02-13 DIAGNOSIS — H16209 Unspecified keratoconjunctivitis, unspecified eye: Secondary | ICD-10-CM | POA: Diagnosis not present

## 2021-03-16 DIAGNOSIS — I63511 Cerebral infarction due to unspecified occlusion or stenosis of right middle cerebral artery: Secondary | ICD-10-CM | POA: Diagnosis not present

## 2021-03-16 DIAGNOSIS — R0989 Other specified symptoms and signs involving the circulatory and respiratory systems: Secondary | ICD-10-CM | POA: Diagnosis not present

## 2021-03-16 DIAGNOSIS — I10 Essential (primary) hypertension: Secondary | ICD-10-CM | POA: Diagnosis not present

## 2021-03-16 DIAGNOSIS — I5021 Acute systolic (congestive) heart failure: Secondary | ICD-10-CM | POA: Diagnosis not present

## 2021-03-16 DIAGNOSIS — E876 Hypokalemia: Secondary | ICD-10-CM | POA: Diagnosis not present

## 2021-03-16 DIAGNOSIS — H16209 Unspecified keratoconjunctivitis, unspecified eye: Secondary | ICD-10-CM | POA: Diagnosis not present

## 2021-03-16 DIAGNOSIS — I749 Embolism and thrombosis of unspecified artery: Secondary | ICD-10-CM | POA: Diagnosis not present

## 2021-03-16 DIAGNOSIS — R7309 Other abnormal glucose: Secondary | ICD-10-CM | POA: Diagnosis not present

## 2021-04-15 DIAGNOSIS — R0989 Other specified symptoms and signs involving the circulatory and respiratory systems: Secondary | ICD-10-CM | POA: Diagnosis not present

## 2021-04-15 DIAGNOSIS — R7309 Other abnormal glucose: Secondary | ICD-10-CM | POA: Diagnosis not present

## 2021-04-15 DIAGNOSIS — I5021 Acute systolic (congestive) heart failure: Secondary | ICD-10-CM | POA: Diagnosis not present

## 2021-04-15 DIAGNOSIS — H16209 Unspecified keratoconjunctivitis, unspecified eye: Secondary | ICD-10-CM | POA: Diagnosis not present

## 2021-04-15 DIAGNOSIS — I749 Embolism and thrombosis of unspecified artery: Secondary | ICD-10-CM | POA: Diagnosis not present

## 2021-04-15 DIAGNOSIS — I63511 Cerebral infarction due to unspecified occlusion or stenosis of right middle cerebral artery: Secondary | ICD-10-CM | POA: Diagnosis not present

## 2021-04-15 DIAGNOSIS — E876 Hypokalemia: Secondary | ICD-10-CM | POA: Diagnosis not present

## 2021-04-15 DIAGNOSIS — I10 Essential (primary) hypertension: Secondary | ICD-10-CM | POA: Diagnosis not present

## 2021-05-16 DIAGNOSIS — R0989 Other specified symptoms and signs involving the circulatory and respiratory systems: Secondary | ICD-10-CM | POA: Diagnosis not present

## 2021-05-16 DIAGNOSIS — R7309 Other abnormal glucose: Secondary | ICD-10-CM | POA: Diagnosis not present

## 2021-05-16 DIAGNOSIS — I63511 Cerebral infarction due to unspecified occlusion or stenosis of right middle cerebral artery: Secondary | ICD-10-CM | POA: Diagnosis not present

## 2021-05-16 DIAGNOSIS — H16209 Unspecified keratoconjunctivitis, unspecified eye: Secondary | ICD-10-CM | POA: Diagnosis not present

## 2021-05-16 DIAGNOSIS — I5021 Acute systolic (congestive) heart failure: Secondary | ICD-10-CM | POA: Diagnosis not present

## 2021-05-16 DIAGNOSIS — I749 Embolism and thrombosis of unspecified artery: Secondary | ICD-10-CM | POA: Diagnosis not present

## 2021-05-16 DIAGNOSIS — I10 Essential (primary) hypertension: Secondary | ICD-10-CM | POA: Diagnosis not present

## 2021-05-16 DIAGNOSIS — E876 Hypokalemia: Secondary | ICD-10-CM | POA: Diagnosis not present

## 2021-06-10 DIAGNOSIS — R6889 Other general symptoms and signs: Secondary | ICD-10-CM | POA: Diagnosis not present

## 2021-06-21 DIAGNOSIS — E118 Type 2 diabetes mellitus with unspecified complications: Secondary | ICD-10-CM | POA: Diagnosis not present

## 2021-06-21 DIAGNOSIS — G8929 Other chronic pain: Secondary | ICD-10-CM | POA: Diagnosis not present

## 2021-06-21 DIAGNOSIS — I5022 Chronic systolic (congestive) heart failure: Secondary | ICD-10-CM | POA: Diagnosis not present

## 2021-06-21 DIAGNOSIS — I2510 Atherosclerotic heart disease of native coronary artery without angina pectoris: Secondary | ICD-10-CM | POA: Diagnosis not present

## 2021-06-21 DIAGNOSIS — Z8673 Personal history of transient ischemic attack (TIA), and cerebral infarction without residual deficits: Secondary | ICD-10-CM | POA: Diagnosis not present

## 2021-06-21 DIAGNOSIS — Z1231 Encounter for screening mammogram for malignant neoplasm of breast: Secondary | ICD-10-CM | POA: Diagnosis not present

## 2021-06-21 DIAGNOSIS — R6889 Other general symptoms and signs: Secondary | ICD-10-CM | POA: Diagnosis not present

## 2021-06-21 DIAGNOSIS — M25552 Pain in left hip: Secondary | ICD-10-CM | POA: Diagnosis not present

## 2021-06-22 ENCOUNTER — Other Ambulatory Visit: Payer: Self-pay | Admitting: Internal Medicine

## 2021-06-22 DIAGNOSIS — Z1231 Encounter for screening mammogram for malignant neoplasm of breast: Secondary | ICD-10-CM

## 2021-07-12 ENCOUNTER — Ambulatory Visit
Admission: RE | Admit: 2021-07-12 | Discharge: 2021-07-12 | Disposition: A | Discharge status: Home / Self Care | Payer: Medicare HMO | Source: Ambulatory Visit | Attending: Orthopedic Surgery | Admitting: Orthopedic Surgery

## 2021-07-12 ENCOUNTER — Other Ambulatory Visit (HOSPITAL_COMMUNITY): Payer: Self-pay | Admitting: Orthopedic Surgery

## 2021-07-12 DIAGNOSIS — M25551 Pain in right hip: Secondary | ICD-10-CM | POA: Insufficient documentation

## 2021-07-21 DIAGNOSIS — R3915 Urgency of urination: Secondary | ICD-10-CM

## 2021-07-21 MED ORDER — OXYBUTYNIN CHLORIDE ER 10 MG PO TB24: 10.0000 mg | ORAL_TABLET | Freq: Every day | ORAL | 3 refills | Status: DC

## 2021-08-13 ENCOUNTER — Other Ambulatory Visit: Payer: Self-pay | Admitting: Orthopedic Surgery

## 2021-08-13 DIAGNOSIS — M545 Low back pain, unspecified: Secondary | ICD-10-CM

## 2021-08-13 DIAGNOSIS — M4316 Spondylolisthesis, lumbar region: Secondary | ICD-10-CM

## 2021-08-13 DIAGNOSIS — M5136 Other intervertebral disc degeneration, lumbar region: Secondary | ICD-10-CM

## 2021-08-13 DIAGNOSIS — M47816 Spondylosis without myelopathy or radiculopathy, lumbar region: Secondary | ICD-10-CM

## 2021-08-26 ENCOUNTER — Other Ambulatory Visit: Payer: Self-pay

## 2021-08-26 ENCOUNTER — Ambulatory Visit
Admission: RE | Admit: 2021-08-26 | Discharge: 2021-08-26 | Disposition: A | Discharge status: Home / Self Care | Payer: Medicare HMO | Source: Ambulatory Visit | Attending: Orthopedic Surgery | Admitting: Orthopedic Surgery

## 2021-08-26 DIAGNOSIS — M47816 Spondylosis without myelopathy or radiculopathy, lumbar region: Secondary | ICD-10-CM | POA: Diagnosis present

## 2021-08-26 DIAGNOSIS — M5136 Other intervertebral disc degeneration, lumbar region: Secondary | ICD-10-CM | POA: Insufficient documentation

## 2021-08-26 DIAGNOSIS — M4316 Spondylolisthesis, lumbar region: Secondary | ICD-10-CM | POA: Insufficient documentation

## 2021-08-26 DIAGNOSIS — M545 Low back pain, unspecified: Secondary | ICD-10-CM | POA: Insufficient documentation

## 2021-09-04 ENCOUNTER — Inpatient Hospital Stay
Admission: EM | Admit: 2021-09-04 | Discharge: 2021-09-08 | DRG: 689 | Disposition: A | Discharge status: Skilled Nursing Facility | Payer: Medicare HMO | Attending: Internal Medicine | Admitting: Internal Medicine

## 2021-09-04 ENCOUNTER — Other Ambulatory Visit: Payer: Self-pay

## 2021-09-04 ENCOUNTER — Emergency Department: Payer: Medicare HMO

## 2021-09-04 DIAGNOSIS — E860 Dehydration: Secondary | ICD-10-CM | POA: Diagnosis present

## 2021-09-04 DIAGNOSIS — Z7984 Long term (current) use of oral hypoglycemic drugs: Secondary | ICD-10-CM

## 2021-09-04 DIAGNOSIS — E8721 Acute metabolic acidosis: Secondary | ICD-10-CM | POA: Diagnosis present

## 2021-09-04 DIAGNOSIS — N179 Acute kidney failure, unspecified: Secondary | ICD-10-CM | POA: Diagnosis present

## 2021-09-04 DIAGNOSIS — Z7902 Long term (current) use of antithrombotics/antiplatelets: Secondary | ICD-10-CM

## 2021-09-04 DIAGNOSIS — Z20822 Contact with and (suspected) exposure to covid-19: Secondary | ICD-10-CM | POA: Diagnosis present

## 2021-09-04 DIAGNOSIS — I5022 Chronic systolic (congestive) heart failure: Secondary | ICD-10-CM | POA: Diagnosis present

## 2021-09-04 DIAGNOSIS — R4182 Altered mental status, unspecified: Secondary | ICD-10-CM

## 2021-09-04 DIAGNOSIS — I69354 Hemiplegia and hemiparesis following cerebral infarction affecting left non-dominant side: Secondary | ICD-10-CM

## 2021-09-04 DIAGNOSIS — N390 Urinary tract infection, site not specified: Principal | ICD-10-CM | POA: Diagnosis present

## 2021-09-04 DIAGNOSIS — J302 Other seasonal allergic rhinitis: Secondary | ICD-10-CM | POA: Diagnosis present

## 2021-09-04 DIAGNOSIS — Z8711 Personal history of peptic ulcer disease: Secondary | ICD-10-CM

## 2021-09-04 DIAGNOSIS — I2510 Atherosclerotic heart disease of native coronary artery without angina pectoris: Secondary | ICD-10-CM | POA: Diagnosis present

## 2021-09-04 DIAGNOSIS — G9341 Metabolic encephalopathy: Secondary | ICD-10-CM | POA: Diagnosis present

## 2021-09-04 DIAGNOSIS — Z961 Presence of intraocular lens: Secondary | ICD-10-CM | POA: Diagnosis present

## 2021-09-04 DIAGNOSIS — I5020 Unspecified systolic (congestive) heart failure: Secondary | ICD-10-CM

## 2021-09-04 DIAGNOSIS — Z79899 Other long term (current) drug therapy: Secondary | ICD-10-CM

## 2021-09-04 DIAGNOSIS — Z9841 Cataract extraction status, right eye: Secondary | ICD-10-CM

## 2021-09-04 DIAGNOSIS — Z91018 Allergy to other foods: Secondary | ICD-10-CM

## 2021-09-04 DIAGNOSIS — R197 Diarrhea, unspecified: Secondary | ICD-10-CM | POA: Diagnosis present

## 2021-09-04 DIAGNOSIS — Z955 Presence of coronary angioplasty implant and graft: Secondary | ICD-10-CM

## 2021-09-04 DIAGNOSIS — E8729 Other acidosis: Secondary | ICD-10-CM | POA: Diagnosis present

## 2021-09-04 DIAGNOSIS — M48061 Spinal stenosis, lumbar region without neurogenic claudication: Secondary | ICD-10-CM

## 2021-09-04 DIAGNOSIS — Z9102 Food additives allergy status: Secondary | ICD-10-CM

## 2021-09-04 DIAGNOSIS — B9620 Unspecified Escherichia coli [E. coli] as the cause of diseases classified elsewhere: Secondary | ICD-10-CM | POA: Diagnosis present

## 2021-09-04 DIAGNOSIS — R5381 Other malaise: Secondary | ICD-10-CM

## 2021-09-04 DIAGNOSIS — K279 Peptic ulcer, site unspecified, unspecified as acute or chronic, without hemorrhage or perforation: Secondary | ICD-10-CM | POA: Diagnosis present

## 2021-09-04 DIAGNOSIS — I10 Essential (primary) hypertension: Secondary | ICD-10-CM | POA: Diagnosis present

## 2021-09-04 DIAGNOSIS — E119 Type 2 diabetes mellitus without complications: Secondary | ICD-10-CM | POA: Diagnosis present

## 2021-09-04 DIAGNOSIS — R4781 Slurred speech: Secondary | ICD-10-CM | POA: Diagnosis present

## 2021-09-04 DIAGNOSIS — I252 Old myocardial infarction: Secondary | ICD-10-CM

## 2021-09-04 DIAGNOSIS — R824 Acetonuria: Secondary | ICD-10-CM | POA: Diagnosis present

## 2021-09-04 DIAGNOSIS — Z9049 Acquired absence of other specified parts of digestive tract: Secondary | ICD-10-CM

## 2021-09-04 DIAGNOSIS — Z823 Family history of stroke: Secondary | ICD-10-CM

## 2021-09-04 DIAGNOSIS — I110 Hypertensive heart disease with heart failure: Secondary | ICD-10-CM | POA: Diagnosis present

## 2021-09-04 LAB — CBC
HCT: 41.2 % (ref 36.0–46.0)
Hemoglobin: 13.3 g/dL (ref 12.0–15.0)
MCH: 30 pg (ref 26.0–34.0)
MCHC: 32.3 g/dL (ref 30.0–36.0)
MCV: 93 fL (ref 80.0–100.0)
Platelets: 271 10*3/uL (ref 150–400)
RBC: 4.43 MIL/uL (ref 3.87–5.11)
RDW: 15.7 % — ABNORMAL HIGH (ref 11.5–15.5)
WBC: 13.5 10*3/uL — ABNORMAL HIGH (ref 4.0–10.5)
nRBC: 0 % (ref 0.0–0.2)

## 2021-09-04 LAB — COMPREHENSIVE METABOLIC PANEL
ALT: 18 U/L (ref 0–44)
AST: 39 U/L (ref 15–41)
Albumin: 3.4 g/dL — ABNORMAL LOW (ref 3.5–5.0)
Alkaline Phosphatase: 46 U/L (ref 38–126)
Anion gap: 16 — ABNORMAL HIGH (ref 5–15)
BUN: 13 mg/dL (ref 8–23)
CO2: 8 mmol/L — ABNORMAL LOW (ref 22–32)
Calcium: 10.3 mg/dL (ref 8.9–10.3)
Chloride: 116 mmol/L — ABNORMAL HIGH (ref 98–111)
Creatinine, Ser: 1.1 mg/dL — ABNORMAL HIGH (ref 0.44–1.00)
GFR, Estimated: 54 mL/min — ABNORMAL LOW (ref >60)
Glucose, Bld: 121 mg/dL — ABNORMAL HIGH (ref 70–99)
Potassium: 3.5 mmol/L (ref 3.5–5.1)
Sodium: 140 mmol/L (ref 135–145)
Total Bilirubin: 0.7 mg/dL (ref 0.3–1.2)
Total Protein: 6.4 g/dL — ABNORMAL LOW (ref 6.5–8.1)

## 2021-09-04 LAB — ETHANOL: Alcohol, Ethyl (B): <10 mg/dL (ref <10)

## 2021-09-04 MED ORDER — ONDANSETRON HCL 4 MG/2ML IJ SOLN
4.0000 mg | Freq: Once | INTRAMUSCULAR | Status: AC
  Administered 2021-09-05: 4 mg via INTRAVENOUS
  Filled 2021-09-04: qty 2

## 2021-09-04 MED ORDER — SODIUM CHLORIDE 0.9 % IV SOLN: INTRAVENOUS | Status: DC

## 2021-09-04 MED ORDER — SODIUM CHLORIDE 0.9 % IV BOLUS
1000.0000 mL | Freq: Once | INTRAVENOUS | Status: AC
  Administered 2021-09-04: 1000 mL via INTRAVENOUS

## 2021-09-04 NOTE — ED Provider Notes (Signed)
11:30 PM  Assumed care at shift change.  Patient here with altered mental status.  Labs show a metabolic acidosis with elevated anion gap, leukocytosis of 13,000.  Normal BUN, glucose 121, ethanol level negative.  CT of the head reviewed by myself and radiologist and shows no acute intracranial abnormality.  We will obtain additional blood work, repeat BMP.  Will hydrate.  Will obtain urinalysis.  Patient may need admission. ? ?2:00 AM  Pt's repeat labs confirm bicarb of 8 but now anion gap is closed.  Her blood glucose is 111.  Salicylate level normal.  Lactic normal.  Normal rectal temp.  VBG shows metabolic acidosis.  Daughter and patient reports she has had diarrhea but does not sound like it has been copious amounts.  She does have a UTI here today.  We will start her on Rocephin and add on a urine culture.  Will discuss with the hospitalist for admission. ? ?I am not convinced that this is DKA.  Discussed with hospitalist who agrees.  We will hold insulin at this time and continue hydration. ? ? ?Consulted and discussed patient's case with hospitalist, Dr. Para March.  I have recommended admission and consulting physician agrees and will place admission orders.  Patient (and family if present) agree with this plan.  ? ?I reviewed all nursing notes, vitals, pertinent previous records.  All labs, EKGs, imaging ordered have been independently reviewed and interpreted by myself. ? ? ? ?CRITICAL CARE ?Performed by: Baxter Hire Asja Frommer ? ? ?Total critical care time: 45 minutes ? ?Critical care time was exclusive of separately billable procedures and treating other patients. ? ?Critical care was necessary to treat or prevent imminent or life-threatening deterioration. ? ?Critical care was time spent personally by me on the following activities: development of treatment plan with patient and/or surrogate as well as nursing, discussions with consultants, evaluation of patient's response to treatment, examination of patient,  obtaining history from patient or surrogate, ordering and performing treatments and interventions, ordering and review of laboratory studies, ordering and review of radiographic studies, pulse oximetry and re-evaluation of patient's condition. ? ?  ?Kody Brandl, Layla Maw, DO ?09/05/21 1660 ? ?

## 2021-09-04 NOTE — ED Notes (Signed)
EDP at bedside  

## 2021-09-04 NOTE — ED Provider Notes (Signed)
? ?[facility] ?Provider Note ? ? ? Event Date/Time  ? First MD Initiated Contact with Patient 09/04/21 1901   ?  (approximate) ? ?History  ? ?Chief Complaint: Weakness and Altered Mental Status ? ?HPI ? ?Brittney Kennedy is a 71 y.o. female with a past medical history of CAD, diabetes, hypertension, left-sided weakness from a prior stroke 3/21, presents to the emergency department for altered mental state.  According to EMS patient's roommate called EMS for slurred speech and confusion.  The patient does have somewhat slurred speech.  She is also noted to have left-sided deficits.  When asked if she normally has left-sided weakness she states I do not remember.  There is documented left-sided weakness from prior notes. ? ?Physical Exam  ? ?Triage Vital Signs: ?ED Triage Vitals  ?Enc Vitals Group  ?   BP 09/04/21 1845 116/72  ?   Pulse Rate 09/04/21 1845 70  ?   Resp 09/04/21 1845 16  ?   Temp 09/04/21 1913 97.9 ?F (36.6 ?C)  ?   Temp Source 09/04/21 1913 Oral  ?   SpO2 09/04/21 1845 98 %  ?   Weight 09/04/21 1847 160 lb (72.6 kg)  ?   Height 09/04/21 1847 5\' 2"  (1.575 m)  ?   Head Circumference --   ?   Peak Flow --   ?   Pain Score 09/04/21 1846 5  ?   Pain Loc --   ?   Pain Edu? --   ?   Excl. in [place]? --   ? ? ?Most recent vital signs: ?Vitals:  ? 09/04/21 1900 09/04/21 1913  ?BP: (!) 108/59   ?Pulse: 66   ?Resp:    ?Temp:  97.9 ?F (36.6 ?C)  ?SpO2: 100%   ? ? ?General: Awake, no distress.  Patient does have mildly slurred speech ?CV:  Good peripheral perfusion.  Regular rate and rhythm  ?Resp:  Normal effort.  Equal breath sounds bilaterally.  ?Abd:  No distention.  Soft, nontender.  No rebound or guarding. ?Other:  Patient has left upper extremity weakness with a fairly moderate pronator drift.  Slight decrease in strength in lifting her left leg compared to her right leg.  Having trouble following more complicated neurologic testing, difficult to assign an NIH stroke scale. ? ? ?ED Results /  Procedures / Treatments  ? ?EKG ? ?EKG viewed and interpreted by myself shows a normal sinus rhythm at 75 bpm with a narrow QRS, normal axis, normal intervals besides slight QTc prolongation, no concerning ST changes. ? ?RADIOLOGY ? ?I have personally reviewed the CT images.  Patient appears to have a remote right posterior cerebral infarct, I do not see any acute bleed or other significant abnormality my evaluation. ?Radiology is read the CT is no acute intracranial abnormality chronic right parieto-occipital infarct. ? ? ?MEDICATIONS ORDERED IN ED: ?Medications - No data to display ? ? ?IMPRESSION / MDM / ASSESSMENT AND PLAN / ED COURSE  ?I reviewed the triage vital signs and the nursing notes. ? ?Patient presents to the emergency department for slurred speech and confusion over baseline.  EMS states roommate called due to slurred speech and patient appeared to be somewhat confused.  Here the patient does have mildly slurred speech.  Is awake alert she answers questions but does appear somewhat confused.  When asked if she normally has weakness in her left arm she states "I do not remember."  Last known normal per EMS was likely  last night.  Currently awaiting family/friend arrival for further history regarding the patient's baseline.  We will proceed with further work-up including lab work, CT scan of the head, urine, COVID swab.  We will IV hydrate while awaiting results.  Patient agreeable to plan. ? ?Patient's labs have begun to result ethanol is negative.  Patient has a slight leukocytosis at 13,000.  On the patient's chemistry she appears to have a low bicarb and slightly elevated anion gap.  Patient care signed out to oncoming provider Dr. Leonides Schanz.  Additional lab work has been ordered as well as a urine urine drug screen VBG repeat chemistry.  Patient has received IV fluids in the interim.  Reassuringly CT scan of the head looks normal, EKG is reassuring.  Disposition pending ? ?FINAL CLINICAL IMPRESSION(S) /  ED DIAGNOSES  ? ?Altered mental status ?Confusion ? ? ?Note:  This document was prepared using Dragon voice recognition software and may include unintentional dictation errors. ?  Harvest Dark, MD ?09/04/21 2354 ? ?

## 2021-09-04 NOTE — ED Triage Notes (Signed)
Pt to ED [facility], pt from home ?Roommate called because speech was slurred sometime between 10a-2pm ? ?18g L forearm ?Hx stroke, chronic deficit L hand/arm (weakness) ?Pt complains of weakness since 2 weeks (histories conflicting, pt and roommate) ?Initial BP was 90/40, then 110/52 before IVF given ? ?Other VS normal, CBG 140 ? ?Pt talks nonstop, this is her baseline per roommate ? ?Pt telling same stories about birds used to have, unknown if pt has dementia ?

## 2021-09-05 DIAGNOSIS — Z8711 Personal history of peptic ulcer disease: Secondary | ICD-10-CM | POA: Diagnosis not present

## 2021-09-05 DIAGNOSIS — Z91018 Allergy to other foods: Secondary | ICD-10-CM | POA: Diagnosis not present

## 2021-09-05 DIAGNOSIS — N3000 Acute cystitis without hematuria: Secondary | ICD-10-CM

## 2021-09-05 DIAGNOSIS — I5022 Chronic systolic (congestive) heart failure: Secondary | ICD-10-CM | POA: Insufficient documentation

## 2021-09-05 DIAGNOSIS — K279 Peptic ulcer, site unspecified, unspecified as acute or chronic, without hemorrhage or perforation: Secondary | ICD-10-CM | POA: Diagnosis present

## 2021-09-05 DIAGNOSIS — E8729 Other acidosis: Secondary | ICD-10-CM | POA: Diagnosis present

## 2021-09-05 DIAGNOSIS — B9620 Unspecified Escherichia coli [E. coli] as the cause of diseases classified elsewhere: Secondary | ICD-10-CM | POA: Diagnosis present

## 2021-09-05 DIAGNOSIS — R824 Acetonuria: Secondary | ICD-10-CM | POA: Diagnosis present

## 2021-09-05 DIAGNOSIS — Z9049 Acquired absence of other specified parts of digestive tract: Secondary | ICD-10-CM | POA: Diagnosis not present

## 2021-09-05 DIAGNOSIS — Z955 Presence of coronary angioplasty implant and graft: Secondary | ICD-10-CM | POA: Diagnosis not present

## 2021-09-05 DIAGNOSIS — R4781 Slurred speech: Secondary | ICD-10-CM | POA: Diagnosis present

## 2021-09-05 DIAGNOSIS — N179 Acute kidney failure, unspecified: Secondary | ICD-10-CM

## 2021-09-05 DIAGNOSIS — I2510 Atherosclerotic heart disease of native coronary artery without angina pectoris: Secondary | ICD-10-CM | POA: Diagnosis present

## 2021-09-05 DIAGNOSIS — I252 Old myocardial infarction: Secondary | ICD-10-CM | POA: Diagnosis not present

## 2021-09-05 DIAGNOSIS — N390 Urinary tract infection, site not specified: Secondary | ICD-10-CM | POA: Diagnosis present

## 2021-09-05 DIAGNOSIS — Z20822 Contact with and (suspected) exposure to covid-19: Secondary | ICD-10-CM | POA: Diagnosis present

## 2021-09-05 DIAGNOSIS — R197 Diarrhea, unspecified: Secondary | ICD-10-CM | POA: Diagnosis present

## 2021-09-05 DIAGNOSIS — E119 Type 2 diabetes mellitus without complications: Secondary | ICD-10-CM | POA: Diagnosis present

## 2021-09-05 DIAGNOSIS — I110 Hypertensive heart disease with heart failure: Secondary | ICD-10-CM | POA: Diagnosis present

## 2021-09-05 DIAGNOSIS — M48061 Spinal stenosis, lumbar region without neurogenic claudication: Secondary | ICD-10-CM | POA: Diagnosis not present

## 2021-09-05 DIAGNOSIS — G9341 Metabolic encephalopathy: Secondary | ICD-10-CM

## 2021-09-05 DIAGNOSIS — I5020 Unspecified systolic (congestive) heart failure: Secondary | ICD-10-CM

## 2021-09-05 DIAGNOSIS — J302 Other seasonal allergic rhinitis: Secondary | ICD-10-CM | POA: Diagnosis present

## 2021-09-05 DIAGNOSIS — I10 Essential (primary) hypertension: Secondary | ICD-10-CM | POA: Diagnosis not present

## 2021-09-05 DIAGNOSIS — Z961 Presence of intraocular lens: Secondary | ICD-10-CM | POA: Diagnosis present

## 2021-09-05 DIAGNOSIS — I69354 Hemiplegia and hemiparesis following cerebral infarction affecting left non-dominant side: Secondary | ICD-10-CM | POA: Diagnosis not present

## 2021-09-05 DIAGNOSIS — Z9841 Cataract extraction status, right eye: Secondary | ICD-10-CM | POA: Diagnosis not present

## 2021-09-05 DIAGNOSIS — E860 Dehydration: Secondary | ICD-10-CM | POA: Diagnosis present

## 2021-09-05 DIAGNOSIS — E8721 Acute metabolic acidosis: Secondary | ICD-10-CM | POA: Diagnosis present

## 2021-09-05 LAB — BASIC METABOLIC PANEL
Anion gap: 14 (ref 5–15)
Anion gap: 18 — ABNORMAL HIGH (ref 5–15)
BUN: 12 mg/dL (ref 8–23)
BUN: 10 mg/dL (ref 8–23)
CO2: 8 mmol/L — ABNORMAL LOW (ref 22–32)
CO2: 12 mmol/L — ABNORMAL LOW (ref 22–32)
Calcium: 9.7 mg/dL (ref 8.9–10.3)
Calcium: 9 mg/dL (ref 8.9–10.3)
Chloride: 121 mmol/L — ABNORMAL HIGH (ref 98–111)
Chloride: 115 mmol/L — ABNORMAL HIGH (ref 98–111)
Creatinine, Ser: 1.06 mg/dL — ABNORMAL HIGH (ref 0.44–1.00)
Creatinine, Ser: 0.81 mg/dL (ref 0.44–1.00)
GFR, Estimated: 57 mL/min — ABNORMAL LOW (ref >60)
GFR, Estimated: >60 mL/min (ref >60)
Glucose, Bld: 111 mg/dL — ABNORMAL HIGH (ref 70–99)
Glucose, Bld: 122 mg/dL — ABNORMAL HIGH (ref 70–99)
Potassium: 3.4 mmol/L — ABNORMAL LOW (ref 3.5–5.1)
Potassium: 3 mmol/L — ABNORMAL LOW (ref 3.5–5.1)
Sodium: 143 mmol/L (ref 135–145)
Sodium: 145 mmol/L (ref 135–145)

## 2021-09-05 LAB — BLOOD GAS, VENOUS
Acid-base deficit: 18.5 mmol/L — ABNORMAL HIGH (ref 0.0–2.0)
Bicarbonate: 7.4 mmol/L — ABNORMAL LOW (ref 20.0–28.0)
O2 Saturation: 89.6 %
Patient temperature: 37
pCO2, Ven: 19 mmHg — CL (ref 44–60)
pH, Ven: 7.2 — ABNORMAL LOW (ref 7.25–7.43)
pO2, Ven: 55 mmHg — ABNORMAL HIGH (ref 32–45)

## 2021-09-05 LAB — URINE DRUG SCREEN, QUALITATIVE (ARMC ONLY)
Amphetamines, Ur Screen: NOT DETECTED
Barbiturates, Ur Screen: NOT DETECTED
Benzodiazepine, Ur Scrn: NOT DETECTED
Cannabinoid 50 Ng, Ur ~~LOC~~: NOT DETECTED
Cocaine Metabolite,Ur ~~LOC~~: NOT DETECTED
MDMA (Ecstasy)Ur Screen: NOT DETECTED
Methadone Scn, Ur: NOT DETECTED
Opiate, Ur Screen: NOT DETECTED
Phencyclidine (PCP) Ur S: NOT DETECTED
Tricyclic, Ur Screen: NOT DETECTED

## 2021-09-05 LAB — URINALYSIS, COMPLETE (UACMP) WITH MICROSCOPIC
Bilirubin Urine: NEGATIVE
Glucose, UA: NEGATIVE mg/dL
Hgb urine dipstick: NEGATIVE
Ketones, ur: 20 mg/dL — AB
Nitrite: POSITIVE — AB
Protein, ur: 30 mg/dL — AB
Specific Gravity, Urine: 1.024 (ref 1.005–1.030)
WBC, UA: >50 WBC/hpf — ABNORMAL HIGH (ref 0–5)
pH: 5 (ref 5.0–8.0)

## 2021-09-05 LAB — GLUCOSE, CAPILLARY
Glucose-Capillary: 107 mg/dL — ABNORMAL HIGH (ref 70–99)
Glucose-Capillary: 112 mg/dL — ABNORMAL HIGH (ref 70–99)

## 2021-09-05 LAB — CBG MONITORING, ED
Glucose-Capillary: 116 mg/dL — ABNORMAL HIGH (ref 70–99)
Glucose-Capillary: 101 mg/dL — ABNORMAL HIGH (ref 70–99)
Glucose-Capillary: 101 mg/dL — ABNORMAL HIGH (ref 70–99)
Glucose-Capillary: 121 mg/dL — ABNORMAL HIGH (ref 70–99)

## 2021-09-05 LAB — CK: Total CK: 69 U/L (ref 38–234)

## 2021-09-05 LAB — LACTIC ACID, PLASMA: Lactic Acid, Venous: 1.5 mmol/L (ref 0.5–1.9)

## 2021-09-05 LAB — MAGNESIUM: Magnesium: 1.5 mg/dL — ABNORMAL LOW (ref 1.7–2.4)

## 2021-09-05 LAB — RESP PANEL BY RT-PCR (FLU A&B, COVID) ARPGX2
Influenza A by PCR: NEGATIVE
Influenza B by PCR: NEGATIVE
SARS Coronavirus 2 by RT PCR: NEGATIVE

## 2021-09-05 LAB — SALICYLATE LEVEL: Salicylate Lvl: <7 mg/dL — ABNORMAL LOW (ref 7.0–30.0)

## 2021-09-05 MED ORDER — ACETAMINOPHEN 325 MG PO TABS: 650.0000 mg | ORAL_TABLET | Freq: Four times a day (QID) | ORAL | Status: DC | PRN

## 2021-09-05 MED ORDER — STERILE WATER FOR INJECTION IV SOLN
INTRAVENOUS | Status: DC
  Filled 2021-09-05 (×3): qty 1000
  Filled 2021-09-05: qty 150
  Filled 2021-09-05 (×2): qty 1000

## 2021-09-05 MED ORDER — SODIUM CHLORIDE 0.9 % IV SOLN
1.0000 g | INTRAVENOUS | Status: DC
  Administered 2021-09-06 – 2021-09-07 (×2): 1 g via INTRAVENOUS
  Filled 2021-09-05: qty 1
  Filled 2021-09-05 (×2): qty 10

## 2021-09-05 MED ORDER — ONDANSETRON HCL 4 MG/2ML IJ SOLN: 4.0000 mg | Freq: Four times a day (QID) | INTRAMUSCULAR | Status: DC | PRN

## 2021-09-05 MED ORDER — POTASSIUM CHLORIDE 10 MEQ/100ML IV SOLN
10.0000 meq | INTRAVENOUS | Status: AC
  Administered 2021-09-05 (×4): 10 meq via INTRAVENOUS
  Filled 2021-09-05 (×3): qty 100

## 2021-09-05 MED ORDER — POTASSIUM CHLORIDE 20 MEQ PO PACK
40.0000 meq | PACK | Freq: Once | ORAL | Status: AC
  Administered 2021-09-05: 40 meq via ORAL
  Filled 2021-09-05: qty 2

## 2021-09-05 MED ORDER — CLOPIDOGREL BISULFATE 75 MG PO TABS
75.0000 mg | ORAL_TABLET | Freq: Every day | ORAL | Status: DC
  Administered 2021-09-05 – 2021-09-08 (×4): 75 mg via ORAL
  Filled 2021-09-05 (×4): qty 1

## 2021-09-05 MED ORDER — STERILE WATER FOR INJECTION IV SOLN
INTRAVENOUS | Status: DC
  Filled 2021-09-05 (×2): qty 1000

## 2021-09-05 MED ORDER — MORPHINE SULFATE (PF) 2 MG/ML IV SOLN
2.0000 mg | INTRAVENOUS | Status: DC | PRN
  Administered 2021-09-05 – 2021-09-06 (×6): 2 mg via INTRAVENOUS
  Filled 2021-09-05 (×6): qty 1

## 2021-09-05 MED ORDER — CARVEDILOL 25 MG PO TABS
25.0000 mg | ORAL_TABLET | Freq: Two times a day (BID) | ORAL | Status: DC
  Administered 2021-09-05 – 2021-09-08 (×6): 25 mg via ORAL
  Filled 2021-09-05 (×7): qty 1

## 2021-09-05 MED ORDER — INSULIN ASPART 100 UNIT/ML IJ SOLN
0.0000 [IU] | INTRAMUSCULAR | Status: DC
  Administered 2021-09-06: 1 [IU] via SUBCUTANEOUS
  Administered 2021-09-06: 3 [IU] via SUBCUTANEOUS
  Administered 2021-09-06 – 2021-09-07 (×2): 1 [IU] via SUBCUTANEOUS
  Administered 2021-09-07 (×2): 2 [IU] via SUBCUTANEOUS
  Administered 2021-09-07: 5 [IU] via SUBCUTANEOUS
  Filled 2021-09-05 (×7): qty 1

## 2021-09-05 MED ORDER — ENOXAPARIN SODIUM 40 MG/0.4ML IJ SOSY
40.0000 mg | PREFILLED_SYRINGE | INTRAMUSCULAR | Status: DC
  Administered 2021-09-05 – 2021-09-08 (×4): 40 mg via SUBCUTANEOUS
  Filled 2021-09-05 (×4): qty 0.4

## 2021-09-05 MED ORDER — SODIUM CHLORIDE 0.9 % IV SOLN
2.0000 g | Freq: Once | INTRAVENOUS | Status: AC
  Administered 2021-09-05: 2 g via INTRAVENOUS
  Filled 2021-09-05: qty 20

## 2021-09-05 MED ORDER — PANTOPRAZOLE SODIUM 40 MG PO TBEC
40.0000 mg | DELAYED_RELEASE_TABLET | Freq: Every day | ORAL | Status: DC
  Administered 2021-09-05 – 2021-09-08 (×4): 40 mg via ORAL
  Filled 2021-09-05 (×4): qty 1

## 2021-09-05 MED ORDER — ACETAMINOPHEN 650 MG RE SUPP: 650.0000 mg | Freq: Four times a day (QID) | RECTAL | Status: DC | PRN

## 2021-09-05 MED ORDER — ONDANSETRON HCL 4 MG PO TABS: 4.0000 mg | ORAL_TABLET | Freq: Four times a day (QID) | ORAL | Status: DC | PRN

## 2021-09-05 MED ORDER — SODIUM CHLORIDE 0.9 % IV BOLUS (SEPSIS): 1000.0000 mL | Freq: Once | INTRAVENOUS | Status: DC

## 2021-09-05 NOTE — ED Notes (Signed)
Patient placed on purewick at this time. 

## 2021-09-05 NOTE — Assessment & Plan Note (Addendum)
-   patient symptoms include AMS ?- etiology considered due to metabolic/septic ?-Continue treatment as noted above ?- Continue delirium precautions ?-Mentation much improved and almost back to baseline per son ?

## 2021-09-05 NOTE — Assessment & Plan Note (Addendum)
-   Blood pressure stable ?- Home med rec to be resumed ?

## 2021-09-05 NOTE — Assessment & Plan Note (Addendum)
-   Bicarb 8 and elevated anion gap ?- UDS, alcohol, salicylate levels negative ?- Etiology suspected due to recent GI losses and some probable dehydration in setting of UTI ?-Responded well to bicarb drip.  Transition to LR today ?

## 2021-09-05 NOTE — Assessment & Plan Note (Addendum)
-   UA noted with positive nitrite, moderate LE, greater than 50 WBC, hyaline casts present ?- Continue Rocephin ?- Follow-up urine culture: Growing E. coli, follow-up sensitivities ?

## 2021-09-05 NOTE — Assessment & Plan Note (Addendum)
Continue Protonix °

## 2021-09-05 NOTE — Hospital Course (Addendum)
[name] is a 70 yo female with PMH CAD, DMII, HFrEF, HTN, PUD, CVA who presented to the ER with weakness and altered mental status.  She was reported to have some slurring of speech and confusion as well as some recent diarrhea.  No sick contacts were noted.  She has not been on any recent antibiotics. ?UA was suggestive of infection and she was started on Rocephin for presumed UTI.  She also had presence of a significant metabolic acidosis also considered due to her underlying infection and was started on a bicarb drip. ?

## 2021-09-05 NOTE — H&P (Signed)
History and Physical    Patient: Brittney Kennedy JEH:[id number] DOB: 1950-11-30 DOA: 09/04/2021 DOS: the patient was seen and examined on 09/05/2021 PCP: Kirk Ruths, MD  Patient coming from: Home  Chief Complaint:  Chief Complaint  Patient presents with   Weakness   Altered Mental Status    HPI: Brittney Kennedy is a 71 y.o. female with medical history significant for DM, HFrEF, HTN, PUD, CAD with history of stent angioplasty who presents to the ED for evaluation of altered mental status, described as slurred speech and confusion.  Patient reports a 4-day history of diarrhea.  She denies vomiting, fever or chills but history limited due to altered mental status. ED course: On arrival vitals within normal limits Blood work significant for bicarb of 8 with anion gap of 16, creatinine 1.10, glucose 121, WBC 13,000 with normal hemoglobin 13.3.  VBG with pH 7.2, PCO2 19.  Urinalysis with positive nitrite and moderate leukocyte esterase and 20 ketones.  Lactic acid 1.5, EtOH less than 10, salicylate level less than 7 and UDS clean.  Following hydration with 1 L NS, bicarb still 8 and anion gap normalized at 14 and creatinine 1.06. EKG, personally viewed and interpreted: NSR at 75 with no acute ST-T wave changes CT head with no acute intracranial abnormality.  Chronic right parieto-occipital infarct Patient given a dose of Rocephin.  Hospitalist consulted for admission.   Review of Systems: unable to review all systems due to the inability of the patient to answer questions. Limited by confusion. Past Medical History:  Diagnosis Date   CAD (coronary artery disease)    a. 09/2019 NSTEMI/Cath: LM nl, LAD 90p, 16m diffuse mid-dist dzs, LCX 923mV groove/OM2, RI 90p, RCA dominant, nl, RPDA 9022mF 25%, glob HK.   Diabetes mellitus, type 2 (HCC)    HFrEF (heart failure with reduced [place] fraction) [place]  a. 09/2019 LV gram: EF 25%, global HK.   Hypertension    Memory impairment    S/P  stroke 09/2019   [facility]   PUD (peptic ulcer disease)    Seasonal allergies    Stroke ([facility]   Walker as ambulation aid    Weakness of left side of body    S/P stroke 09/2019   Wears dentures    full upper and lower   Past Surgical History:  Procedure Laterality Date   CATARACT EXTRACTION W/PHACO Right 08/11/2020   Procedure: CATARACT EXTRACTION PHACO AND INTRAOCULAR LENS PLACEMENT ([place] 6.42 00:44.0;  Surgeon: PorBirder RobsonD;  [facility];  Laterality: Right;  Diabetic - oral meds   CHOLECYSTECTOMY     CORONARY STENT INTERVENTION N/A 09/30/2019   Procedure: CORONARY STENT INTERVENTION;  Surgeon: JorMartiniqueeter M, MD;  Location: [facility];  Service: Cardiovascular;  Laterality: N/A;   ESOPHAGOGASTRODUODENOSCOPY (EGD) WITH PROPOFOL N/A 10/08/2019   Procedure: ESOPHAGOGASTRODUODENOSCOPY (EGD) WITH PROPOFOL;  Surgeon: ArmYetta FlockD;  [facility];  Laterality: N/A;   HEMOSTASIS CLIP PLACEMENT  10/08/2019   Procedure: HEMOSTASIS CLIP PLACEMENT;  Surgeon: ArmYetta FlockD;  [facility] [place];;   HEMOSTASIS CONTROL  10/08/2019   Procedure: HEMOSTASIS CONTROL;  Surgeon: ArmYetta FlockD;  [facility];  Service: Gastroenterology;;   LEFT HEART CATH AND CORONARY ANGIOGRAPHY N/A 09/30/2019   Procedure: LEFT HEART CATH AND CORONARY ANGIOGRAPHY;  Surgeon: JorMartiniqueeter M, MD;  Location[facility]  [facility];  Service: Cardiovascular;  Laterality: N/A;   LEFT HEART CATH AND CORS/GRAFTS ANGIOGRAPHY N/A 09/27/2019   Procedure: LEFT HEART CATH AND CORS/GRAFTS ANGIOGRAPHY;  Surgeon: Minna Merritts, MD;  Location: [facility] [facility] LAB;  Service: Cardiovascular;  Laterality: N/A;   Social History:  reports that she has never smoked. She has never used smokeless tobacco. She reports that she does not drink  alcohol and does not use drugs.  Allergies  Allergen Reactions   Onion Anaphylaxis and Hives    (Raw onions)   Monosodium Glutamate Nausea Only    Headache Fatigue    Family History  Problem Relation Age of Onset   Stroke Mother        died @ 81   Multiple myeloma Father        died @ 93   Multiple myeloma Sister     Prior to Admission medications   Medication Sig Start Date End Date Taking? Authorizing Provider  acetaminophen (TYLENOL) 325 MG tablet Take 2 tablets (650 mg total) by mouth every 4 (four) hours as needed for mild pain or headache. 11/13/19   Angiulli, Lavon Paganini, PA-C  atorvastatin (LIPITOR) 80 MG tablet Take 1 tablet (80 mg total) by mouth daily at 6 PM. 12/11/19   Bensimhon, Shaune Pascal, MD  Calcium Carbonate-Vit D-Min (CALCIUM-VITAMIN D-MINERALS) 600-800 MG-UNIT CHEW Chew by mouth.    [provider]  carvedilol (COREG) 25 MG tablet Take 1 tablet (25 mg total) by mouth 2 (two) times daily. 01/31/20 04/30/20  Marrianne Mood D, PA-C  clopidogrel (PLAVIX) 75 MG tablet Take 1 tablet (75 mg total) by mouth daily with breakfast. 12/11/19   Bensimhon, Shaune Pascal, MD  diclofenac Sodium (VOLTAREN) 1 % GEL Apply 2 g topically daily as needed.    [provider]  docusate sodium (COLACE) 100 MG capsule Take 100 mg by mouth 2 (two) times daily.    [provider]  glipiZIDE (GLUCOTROL) 5 MG tablet Take by mouth. 06/19/20 06/19/21  [provider]  hydrocortisone cream 1 % Apply 1 application topically 2 (two) times daily as needed for itching.    [provider]  losartan (COZAAR) 100 MG tablet Take 1 tablet (100 mg total) by mouth daily. 12/11/19   Bensimhon, Shaune Pascal, MD  magnesium gluconate (MAGONATE) 500 MG tablet Take 500 mg by mouth every 8 (eight) hours.    [provider]  Melatonin 10 MG CAPS Take 20 mg by mouth daily as needed.     [provider]  metFORMIN (GLUCOPHAGE) 500 MG tablet Take 1 tablet (500 mg total) by  mouth daily with breakfast. Patient taking differently: Take 500 mg by mouth 2 (two) times daily with a meal. 12/11/19   Bensimhon, Shaune Pascal, MD  Multiple Vitamin (MULTIVITAMIN WITH MINERALS) TABS tablet Take 1 tablet by mouth daily.    [provider]  nitroGLYCERIN (NITROSTAT) 0.4 MG SL tablet Place 1 tablet (0.4 mg total) under the tongue every 5 (five) minutes as needed for chest pain. 11/13/19   Angiulli, Lavon Paganini, PA-C  oxybutynin (DITROPAN-XL) 10 MG 24 hr tablet Take 1 tablet (10 mg total) by mouth daily. 07/21/21   McGowan, Hunt Oris, PA-C  pantoprazole (PROTONIX) 40 MG tablet Take 1 tablet (40 mg total) by mouth daily before breakfast. 04/20/20   Irene Shipper, MD    Physical Exam: Vitals:   09/04/21 1900 09/04/21 1913 09/04/21 2224 09/05/21 0047  BP: (!) 108/59  131/74 (!) 160/144  Pulse:  66  80 88  Resp:   20 20  Temp:  97.9 F (36.6 C) 97.8 F (36.6 C) 99.1 F (37.3 C)  TempSrc:  Oral Oral Rectal  SpO2: 100%  97% 100%  Weight:      Height:       Physical Exam Vitals and nursing note reviewed.  Constitutional:      General: She is not in acute distress.    Appearance: She is ill-appearing.  HENT:     Head: Normocephalic and atraumatic.  Cardiovascular:     Rate and Rhythm: Normal rate and regular rhythm.     Pulses: Normal pulses.     Heart sounds: Normal heart sounds. No murmur heard. Pulmonary:     Effort: Pulmonary effort is normal.     Breath sounds: Normal breath sounds. No wheezing or rhonchi.  Abdominal:     General: Bowel sounds are normal.     Palpations: Abdomen is soft.     Tenderness: There is no abdominal tenderness.  Musculoskeletal:        General: No swelling or tenderness. Normal range of motion.     Cervical back: Normal range of motion and neck supple.  Skin:    General: Skin is warm and dry.  Neurological:     General: No focal deficit present.     Mental Status: She is alert. Mental status is at baseline. She is disoriented.   Psychiatric:        Mood and Affect: Mood normal.        Behavior: Behavior normal.     Data Reviewed: Relevant notes from primary care and specialist visits, past discharge summaries as available in EHR, including Care Everywhere. Prior diagnostic testing as pertinent to current admission diagnoses Updated medications and problem lists for reconciliation ED course, including vitals, labs, imaging, treatment and response to treatment Triage notes, nursing and pharmacy notes and ED provider's notes Notable results as noted in HPI   Assessment and Plan: * High anion gap metabolic acidosis Patient had very borderline elevation in anion gap to 16 that improved following a 1 L NS bolus UDS alcohol and salicylate acid levels unremarkable.  Mild ketonuria Suspect related to acute diarrheal illness Euglycemic ketoacidosis not suspected at this time.  Alcoholic ketoacidosis not suspected Will hydrate with sodium bicarb  AKI (acute kidney injury) ([place]) Suspect prerenal Lab Results  Component Value Date   CREATININE 1.06 (H) 09/05/2021   CREATININE 1.10 (H) 09/04/2021   CREATININE 0.92 01/31/2020    IV hydration and monitor Hold home losartan and metformin  Acute metabolic encephalopathy Secondary to acute illness, dehydration We will keep n.p.o. overnight until more alert and safe to swallow Neurochecks Fall and aspiration precautions Delirium precautions  UTI (urinary tract infection) IV Rocephin Follow cultures  Diabetes mellitus, type 2 (HCC) Sliding scale insulin coverage  HFrEF (heart failure with reduced ejection fraction) (HCC) Currently compensated Monitor for fluid overload in view of IV hydration  Essential hypertension Hydralazine IV as needed until able to resume oral home meds of losartan and carvedilol  PUD (peptic ulcer disease) Resume Protonix in the a.m.  CAD (coronary artery disease) To resume home meds of atorvastatin, carvedilol, losartan and  NTG when more alert       Advance Care Planning:   Code Status: Prior   Consults: none  Family Communication: none  Severity of Illness: The appropriate patient status for this patient is INPATIENT. Inpatient status is judged to be reasonable and necessary in  order to provide the required intensity of service to ensure the patient's safety. The patient's presenting symptoms, physical exam findings, and initial radiographic and laboratory data in the context of their chronic comorbidities is felt to place them at high risk for further clinical deterioration. Furthermore, it is not anticipated that the patient will be medically stable for discharge from the hospital within 2 midnights of admission.   * I certify that at the point of admission it is my clinical judgment that the patient will require inpatient hospital care spanning beyond 2 midnights from the point of admission due to high intensity of service, high risk for further deterioration and high frequency of surveillance required.*  Author: Athena Masse, MD 09/05/2021 2:39 AM  For on call review [email or web address].

## 2021-09-05 NOTE — Assessment & Plan Note (Addendum)
Currently compensated ?Monitor for fluid overload in view of IV hydration ?

## 2021-09-05 NOTE — Assessment & Plan Note (Addendum)
-   baseline creatinine ~ 0.7 ?- patient presents with increase in creat >0.3 mg/dL above baseline, creat increase >1.5x baseline presumed to have occurred within past 7 days PTA ?-Creatinine 1.1 on admission ?- s/p bicarb drip; transition to LR today ?- Trend BMP ? ?

## 2021-09-05 NOTE — Progress Notes (Signed)
Progress Note   Patient: Brittney Kennedy AJO:[id number] DOB: 1951/01/12 DOA: 09/04/2021     0 DOS: the patient was seen and examined on 09/05/2021   Brief hospital course: [name] is a 71 yo female with PMH CAD, DMII, HFrEF, HTN, PUD, CVA who presented to the ER with weakness and altered mental status.  She was reported to have some slurring of speech and confusion as well as some recent diarrhea.  No sick contacts were noted.  She has not been on any recent antibiotics. UA was suggestive of infection and she was started on Rocephin for presumed UTI.  She also had presence of a significant metabolic acidosis also considered due to her underlying infection and was started on a bicarb drip.  Assessment and Plan: * High anion gap metabolic acidosis - Bicarb 8 and elevated anion gap - UDS, alcohol, salicylate levels negative - Etiology suspected due to recent GI losses and some probable dehydration in setting of UTI - Continue bicarb drip and repeat BMP this afternoon  AKI (acute kidney injury) (HCC) - baseline creatinine ~ 0.7 - patient presents with increase in creat >0.3 mg/dL above baseline, creat increase >1.5x baseline presumed to have occurred within past 7 days PTA -Creatinine 1.1 on admission - Continue bicarb drip - Trend BMP   UTI (urinary tract infection) - UA noted with positive nitrite, moderate LE, greater than 50 WBC, hyaline casts present - Continue Rocephin - Follow-up urine culture  Acute metabolic encephalopathy - patient symptoms include AMS - etiology considered due to metabolic/septic -Continue treatment as noted above - Continue delirium precautions   Diabetes mellitus, type 2 (HCC) - Continue SSI and CBG monitoring  HFrEF (heart failure with reduced ejection fraction) (HCC) Currently compensated Monitor for fluid overload in view of IV hydration  Essential hypertension - Blood pressure stable - Home med rec to be resumed  PUD (peptic ulcer  disease) - Continue Protonix  CAD (coronary artery disease) - Blood pressure stable and mentation improving - We will start to resume home meds        Subjective: Seen in the ER this morning.  She was able to answer questions appropriately and confusion seemed to be improved.  She also stated that she was feeling a little better compared to when she came in. She states that her son lives out of town and is coming today to see her as she is in the hospital.  Physical Exam: Vitals:   09/05/21 0527 09/05/21 0730 09/05/21 0900 09/05/21 1030  BP: 129/79 140/66 128/61 134/63  Pulse: 85 76 76 75  Resp: 20 18 18 18   Temp: 98.2 F (36.8 C)     TempSrc: Oral     SpO2: 100% 99% 99% 96%  Weight:      Height:       Physical Exam Constitutional:      General: She is not in acute distress.    Comments: Fatigued appearing  HENT:     Head: Normocephalic and atraumatic.     Mouth/Throat:     Mouth: Mucous membranes are moist.  Eyes:     Extraocular Movements: Extraocular movements intact.  Cardiovascular:     Rate and Rhythm: Normal rate and regular rhythm.  Pulmonary:     Effort: Pulmonary effort is normal.     Breath sounds: Normal breath sounds.  Abdominal:     General: Bowel sounds are normal. There is no distension.     Palpations: Abdomen is soft.  Tenderness: There is no abdominal tenderness.  Musculoskeletal:        General: Normal range of motion.     Cervical back: Normal range of motion and neck supple.  Skin:    General: Skin is warm and dry.  Neurological:     General: No focal deficit present.     Mental Status: She is alert.  Psychiatric:        Mood and Affect: Mood normal.        Behavior: Behavior normal.     Data Reviewed:  Results for orders placed or performed during the hospital encounter of 09/04/21 (from the past 24 hour(s))  Comprehensive metabolic panel     Status: Abnormal   Collection Time: 09/04/21  6:49 PM  Result Value Ref Range    Sodium 140 135 - 145 mmol/L   Potassium 3.5 3.5 - 5.1 mmol/L   Chloride 116 (H) 98 - 111 mmol/L   CO2 8 (L) 22 - 32 mmol/L   Glucose, Bld 121 (H) 70 - 99 mg/dL   BUN 13 8 - 23 mg/dL   Creatinine, Ser 1.61 (H) 0.44 - 1.00 mg/dL   Calcium 09.6 8.9 - 04.5 mg/dL   Total Protein 6.4 (L) 6.5 - 8.1 g/dL   Albumin 3.4 (L) 3.5 - 5.0 g/dL   AST 39 15 - 41 U/L   ALT 18 0 - 44 U/L   Alkaline Phosphatase 46 38 - 126 U/L   Total Bilirubin 0.7 0.3 - 1.2 mg/dL   GFR, Estimated 54 (L) >60 mL/min   Anion gap 16 (H) 5 - 15  CBC     Status: Abnormal   Collection Time: 09/04/21  6:49 PM  Result Value Ref Range   WBC 13.5 (H) 4.0 - 10.5 K/uL   RBC 4.43 3.87 - 5.11 MIL/uL   Hemoglobin 13.3 12.0 - 15.0 g/dL   HCT 40.9 81.1 - 91.4 %   MCV 93.0 80.0 - 100.0 fL   MCH 30.0 26.0 - 34.0 pg   MCHC 32.3 30.0 - 36.0 g/dL   RDW 78.2 (H) 95.6 - 21.3 %   Platelets 271 150 - 400 K/uL   nRBC 0.0 0.0 - 0.2 %  Ethanol     Status: None   Collection Time: 09/04/21  8:04 PM  Result Value Ref Range   Alcohol, Ethyl (B) <10 <10 mg/dL  Urinalysis, Complete w Microscopic     Status: Abnormal   Collection Time: 09/05/21 12:47 AM  Result Value Ref Range   Color, Urine YELLOW (A) YELLOW   APPearance CLOUDY (A) CLEAR   Specific Gravity, Urine 1.024 1.005 - 1.030   pH 5.0 5.0 - 8.0   Glucose, UA NEGATIVE NEGATIVE mg/dL   Hgb urine dipstick NEGATIVE NEGATIVE   Bilirubin Urine NEGATIVE NEGATIVE   Ketones, ur 20 (A) NEGATIVE mg/dL   Protein, ur 30 (A) NEGATIVE mg/dL   Nitrite POSITIVE (A) NEGATIVE   Leukocytes,Ua MODERATE (A) NEGATIVE   RBC / HPF 11-20 0 - 5 RBC/hpf   WBC, UA >50 (H) 0 - 5 WBC/hpf   Bacteria, UA RARE (A) NONE SEEN   Squamous Epithelial / LPF 0-5 0 - 5   Mucus PRESENT    Hyaline Casts, UA PRESENT   Urine Drug Screen, Qualitative ([facility] only)     Status: None   Collection Time: 09/05/21 12:47 AM  Result Value Ref Range   Tricyclic, Ur Screen NONE DETECTED NONE DETECTED   Amphetamines, Ur Screen NONE  DETECTED  NONE DETECTED   MDMA (Ecstasy)Ur Screen NONE DETECTED NONE DETECTED   Cocaine Metabolite,Ur Iron NONE DETECTED NONE DETECTED   Opiate, Ur Screen NONE DETECTED NONE DETECTED   Phencyclidine (PCP) Ur S NONE DETECTED NONE DETECTED   Cannabinoid 50 Ng, Ur [place] NONE DETECTED NONE DETECTED   Barbiturates, Ur Screen NONE DETECTED NONE DETECTED   Benzodiazepine, Ur Scrn NONE DETECTED NONE DETECTED   Methadone Scn, Ur NONE DETECTED NONE DETECTED  Blood gas, venous     Status: Abnormal (Preliminary result)   Collection Time: 09/05/21 12:59 AM  Result Value Ref Range   pH, Ven 7.2 (L) 7.25 - 7.43   pCO2, Ven 19 (LL) 44 - 60 mmHg   pO2, Ven 55 (H) 32 - 45 mmHg   Bicarbonate 7.4 (L) 20.0 - 28.0 mmol/L   Acid-base deficit 18.5 (H) 0.0 - 2.0 mmol/L   O2 Saturation 89.6 %   Patient temperature 37.0    Collection site VENOUS    Drawn by PENDING   Lactic acid, plasma     Status: None   Collection Time: 09/05/21 12:59 AM  Result Value Ref Range   Lactic Acid, Venous 1.5 0.5 - 1.9 mmol/L  Salicylate level     Status: Abnormal   Collection Time: 09/05/21 12:59 AM  Result Value Ref Range   Salicylate Lvl <7.0 (L) 7.0 - 30.0 mg/dL  Basic metabolic panel     Status: Abnormal   Collection Time: 09/05/21 12:59 AM  Result Value Ref Range   Sodium 143 135 - 145 mmol/L   Potassium 3.4 (L) 3.5 - 5.1 mmol/L   Chloride 121 (H) 98 - 111 mmol/L   CO2 8 (L) 22 - 32 mmol/L   Glucose, Bld 111 (H) 70 - 99 mg/dL   BUN 12 8 - 23 mg/dL   Creatinine, Ser 8.29 (H) 0.44 - 1.00 mg/dL   Calcium 9.7 8.9 - 93.7 mg/dL   GFR, Estimated 57 (L) >60 mL/min   Anion gap 14 5 - 15  CK     Status: None   Collection Time: 09/05/21 12:59 AM  Result Value Ref Range   Total CK 69 38 - 234 U/L  Magnesium     Status: Abnormal   Collection Time: 09/05/21 12:59 AM  Result Value Ref Range   Magnesium 1.5 (L) 1.7 - 2.4 mg/dL  Resp Panel by RT-PCR (Flu A&B, Covid) Nasopharyngeal Swab     Status: None   Collection Time: 09/05/21   1:47 AM   Specimen: Nasopharyngeal Swab; Nasopharyngeal(NP) swabs in vial transport medium  Result Value Ref Range   SARS Coronavirus 2 by RT PCR NEGATIVE NEGATIVE   Influenza A by PCR NEGATIVE NEGATIVE   Influenza B by PCR NEGATIVE NEGATIVE  CBG monitoring, ED     Status: Abnormal   Collection Time: 09/05/21  4:32 AM  Result Value Ref Range   Glucose-Capillary 116 (H) 70 - 99 mg/dL  CBG monitoring, ED     Status: Abnormal   Collection Time: 09/05/21  7:35 AM  Result Value Ref Range   Glucose-Capillary 101 (H) 70 - 99 mg/dL    I have Reviewed nursing notes, Vitals, and Lab results since pt's last encounter. Pertinent lab results : see above I have ordered test including BMP, CBC, Mg I have reviewed the last note from staff over past 24 hours I have discussed pt's care plan and test results with nursing staff, case manager   Family Communication:   Disposition: Status is: Inpatient  Remains inpatient appropriate because: Treatment as outlined in A&P   Planned Discharge Destination: Home   Antimicrobials: Rocephin 09/05/2021 >> current  Consultants:   Procedures:    DVT ppx:  enoxaparin (LOVENOX) injection 40 mg Start: 09/05/21 0800     Code Status: Full Code    Author: Lewie Chamber, MD 09/05/2021 11:50 AM  For on call review [email or web address].

## 2021-09-05 NOTE — Assessment & Plan Note (Addendum)
-   Continue SSI and CBG monitoring ?

## 2021-09-05 NOTE — Assessment & Plan Note (Addendum)
-   Blood pressure stable and mentation improving ?- We will start to resume home meds ?

## 2021-09-05 NOTE — ED Notes (Signed)
Update given to patient's son at this time. 

## 2021-09-06 DIAGNOSIS — N179 Acute kidney failure, unspecified: Secondary | ICD-10-CM | POA: Diagnosis not present

## 2021-09-06 DIAGNOSIS — M48061 Spinal stenosis, lumbar region without neurogenic claudication: Secondary | ICD-10-CM | POA: Diagnosis not present

## 2021-09-06 DIAGNOSIS — R5381 Other malaise: Secondary | ICD-10-CM

## 2021-09-06 LAB — CBC WITH DIFFERENTIAL/PLATELET
Abs Immature Granulocytes: 0.12 10*3/uL — ABNORMAL HIGH (ref 0.00–0.07)
Basophils Absolute: 0.1 10*3/uL (ref 0.0–0.1)
Basophils Relative: 1 %
Eosinophils Absolute: 0.1 10*3/uL (ref 0.0–0.5)
Eosinophils Relative: 1 %
HCT: 34.3 % — ABNORMAL LOW (ref 36.0–46.0)
Hemoglobin: 12.1 g/dL (ref 12.0–15.0)
Immature Granulocytes: 1 %
Lymphocytes Relative: 26 %
Lymphs Abs: 2.8 10*3/uL (ref 0.7–4.0)
MCH: 31.2 pg (ref 26.0–34.0)
MCHC: 35.3 g/dL (ref 30.0–36.0)
MCV: 88.4 fL (ref 80.0–100.0)
Monocytes Absolute: 1.1 10*3/uL — ABNORMAL HIGH (ref 0.1–1.0)
Monocytes Relative: 10 %
Neutro Abs: 6.6 10*3/uL (ref 1.7–7.7)
Neutrophils Relative %: 61 %
Platelets: 225 10*3/uL (ref 150–400)
RBC: 3.88 MIL/uL (ref 3.87–5.11)
RDW: 15.7 % — ABNORMAL HIGH (ref 11.5–15.5)
WBC: 11 10*3/uL — ABNORMAL HIGH (ref 4.0–10.5)
nRBC: 0 % (ref 0.0–0.2)

## 2021-09-06 LAB — BASIC METABOLIC PANEL
Anion gap: 10 (ref 5–15)
BUN: 7 mg/dL — ABNORMAL LOW (ref 8–23)
CO2: 22 mmol/L (ref 22–32)
Calcium: 8.4 mg/dL — ABNORMAL LOW (ref 8.9–10.3)
Chloride: 109 mmol/L (ref 98–111)
Creatinine, Ser: 0.7 mg/dL (ref 0.44–1.00)
GFR, Estimated: >60 mL/min (ref >60)
Glucose, Bld: 110 mg/dL — ABNORMAL HIGH (ref 70–99)
Potassium: 3.1 mmol/L — ABNORMAL LOW (ref 3.5–5.1)
Sodium: 141 mmol/L (ref 135–145)

## 2021-09-06 LAB — MAGNESIUM: Magnesium: 1.2 mg/dL — ABNORMAL LOW (ref 1.7–2.4)

## 2021-09-06 LAB — HEMOGLOBIN A1C
Hgb A1c MFr Bld: 6 % — ABNORMAL HIGH (ref 4.8–5.6)
Mean Plasma Glucose: 126 mg/dL

## 2021-09-06 LAB — GLUCOSE, CAPILLARY
Glucose-Capillary: 99 mg/dL (ref 70–99)
Glucose-Capillary: 108 mg/dL — ABNORMAL HIGH (ref 70–99)
Glucose-Capillary: 210 mg/dL — ABNORMAL HIGH (ref 70–99)
Glucose-Capillary: 121 mg/dL — ABNORMAL HIGH (ref 70–99)
Glucose-Capillary: 140 mg/dL — ABNORMAL HIGH (ref 70–99)

## 2021-09-06 LAB — HIV ANTIBODY (ROUTINE TESTING W REFLEX): HIV Screen 4th Generation wRfx: NONREACTIVE

## 2021-09-06 MED ORDER — MAGNESIUM SULFATE 4 GM/100ML IV SOLN
4.0000 g | Freq: Once | INTRAVENOUS | Status: AC
  Administered 2021-09-06: 4 g via INTRAVENOUS
  Filled 2021-09-06: qty 100

## 2021-09-06 MED ORDER — ADULT MULTIVITAMIN W/MINERALS CH
1.0000 | ORAL_TABLET | Freq: Every day | ORAL | Status: DC
  Administered 2021-09-07 – 2021-09-08 (×2): 1 via ORAL
  Filled 2021-09-06 (×2): qty 1

## 2021-09-06 MED ORDER — MELATONIN 5 MG PO TABS
2.5000 mg | ORAL_TABLET | Freq: Every evening | ORAL | Status: DC | PRN
  Administered 2021-09-06: 2.5 mg via ORAL
  Filled 2021-09-06: qty 1

## 2021-09-06 MED ORDER — ACETAMINOPHEN 325 MG PO TABS
650.0000 mg | ORAL_TABLET | ORAL | Status: DC | PRN
Start: 2021-09-06 — End: 2021-09-08

## 2021-09-06 MED ORDER — NAPHAZOLINE-PHENIRAMINE 0.025-0.3 % OP SOLN
1.0000 [drp] | Freq: Four times a day (QID) | OPHTHALMIC | Status: DC | PRN
  Filled 2021-09-06: qty 5

## 2021-09-06 MED ORDER — LACTATED RINGERS IV SOLN: INTRAVENOUS | Status: DC

## 2021-09-06 MED ORDER — MAGNESIUM SULFATE 2 GM/50ML IV SOLN
2.0000 g | Freq: Once | INTRAVENOUS | Status: AC
  Administered 2021-09-06: 2 g via INTRAVENOUS
  Filled 2021-09-06: qty 50

## 2021-09-06 MED ORDER — TRAMADOL HCL 50 MG PO TABS
50.0000 mg | ORAL_TABLET | Freq: Four times a day (QID) | ORAL | Status: DC | PRN
  Administered 2021-09-06 – 2021-09-08 (×6): 50 mg via ORAL
  Filled 2021-09-06 (×6): qty 1

## 2021-09-06 MED ORDER — POTASSIUM CHLORIDE CRYS ER 20 MEQ PO TBCR
40.0000 meq | EXTENDED_RELEASE_TABLET | ORAL | Status: AC
Start: 2021-09-06 — End: 2021-09-06
  Administered 2021-09-06 (×2): 40 meq via ORAL
  Filled 2021-09-06 (×2): qty 2

## 2021-09-06 MED ORDER — ACETAMINOPHEN 650 MG RE SUPP: 650.0000 mg | Freq: Four times a day (QID) | RECTAL | Status: DC | PRN

## 2021-09-06 MED ORDER — ENSURE ENLIVE PO LIQD
237.0000 mL | Freq: Three times a day (TID) | ORAL | Status: DC
  Administered 2021-09-06 – 2021-09-08 (×6): 237 mL via ORAL

## 2021-09-06 NOTE — Progress Notes (Addendum)
Progress Note   Patient: Brittney Kennedy EZM:[id number] DOB: [birth year] DOA: 09/04/2021     1 DOS: the patient was seen and examined on 09/06/2021   Brief hospital course: [name] is a 71 yo female with PMH CAD, DMII, HFrEF, HTN, PUD, CVA who presented to the ER with weakness and altered mental status.  She was reported to have some slurring of speech and confusion as well as some recent diarrhea.  No sick contacts were noted.  She has not been on any recent antibiotics. UA was suggestive of infection and she was started on Rocephin for presumed UTI.  She also had presence of a significant metabolic acidosis also considered due to her underlying infection and was started on a bicarb drip.  Assessment and Plan: * UTI (urinary tract infection) - UA noted with positive nitrite, moderate LE, greater than 50 WBC, hyaline casts present - Continue Rocephin - Follow-up urine culture: Growing E. coli, follow-up sensitivities  AKI (acute kidney injury) (HCC) - baseline creatinine ~ 0.7 - patient presents with increase in creat >0.3 mg/dL above baseline, creat increase >1.5x baseline presumed to have occurred within past 7 days PTA -Creatinine 1.1 on admission - s/p bicarb drip; transition to LR today - Trend BMP   High anion gap metabolic acidosis - Bicarb 8 and elevated anion gap - UDS, alcohol, salicylate levels negative - Etiology suspected due to recent GI losses and some probable dehydration in setting of UTI -Responded well to bicarb drip.  Transition to LR today  Acute metabolic encephalopathy - patient symptoms include AMS - etiology considered due to metabolic/septic -Continue treatment as noted above - Continue delirium precautions -Mentation much improved and almost back to baseline per son  Physical deconditioning - Patient's son reports that patient lives at home with a friend and she has become less active notably over the past 1 year. -He is expecting her to likely require  going to rehab at this point but possibly needing long-term care - PT/OT have evaluated patient and are recommending rehab at this time - Continue treatment for UTI and patient will probably be medically stable for discharge on Tuesday or Wednesday  Diabetes mellitus, type 2 (HCC) - Continue SSI and CBG monitoring  HFrEF (heart failure with reduced ejection fraction) (HCC) Currently compensated Monitor for fluid overload in view of IV hydration  Essential hypertension - Blood pressure stable - Home med rec to be resumed  PUD (peptic ulcer disease) - Continue Protonix  CAD (coronary artery disease) - Blood pressure stable and mentation improving - We will start to resume home meds      Subjective: No events overnight.  Mentation continues to improve.  Her son arrived from [place] last night and updated him in person this morning.  Tentative plan will be for continuing to treat UTI then pursuing discharge to rehab.  Physical Exam: Vitals:   09/05/21 1742 09/05/21 2050 09/06/21 0405 09/06/21 0746  BP: 112/66 98/61 (!) 105/57 (!) 101/59  Pulse: 72 74 70 67  Resp: 19 17 18 18   Temp: 98 F (36.7 C) 98.8 F (37.1 C) 98.5 F (36.9 C) 98.3 F (36.8 C)  TempSrc: Oral Oral Oral   SpO2: 100% 100% 99% 99%  Weight: 70.4 kg     Height: 5\' 2"  (1.575 m)      Physical Exam Constitutional:      General: She is not in acute distress.    Comments: Fatigued appearing but improving  HENT:     Head:  Normocephalic and atraumatic.     Mouth/Throat:     Mouth: Mucous membranes are moist.  Eyes:     Extraocular Movements: Extraocular movements intact.  Cardiovascular:     Rate and Rhythm: Normal rate and regular rhythm.  Pulmonary:     Effort: Pulmonary effort is normal.     Breath sounds: Normal breath sounds.  Abdominal:     General: Bowel sounds are normal. There is no distension.     Palpations: Abdomen is soft.     Tenderness: There is no abdominal tenderness.   Musculoskeletal:        General: Normal range of motion.     Cervical back: Normal range of motion and neck supple.  Skin:    General: Skin is warm and dry.  Neurological:     General: No focal deficit present.     Mental Status: She is alert.  Psychiatric:        Mood and Affect: Mood normal.        Behavior: Behavior normal.     Data Reviewed:  Results for orders placed or performed during the hospital encounter of 09/04/21 (from the past 24 hour(s))  Basic metabolic panel     Status: Abnormal   Collection Time: 09/05/21  2:45 PM  Result Value Ref Range   Sodium 145 135 - 145 mmol/L   Potassium 3.0 (L) 3.5 - 5.1 mmol/L   Chloride 115 (H) 98 - 111 mmol/L   CO2 12 (L) 22 - 32 mmol/L   Glucose, Bld 122 (H) 70 - 99 mg/dL   BUN 10 8 - 23 mg/dL   Creatinine, Ser 6.06 0.44 - 1.00 mg/dL   Calcium 9.0 8.9 - 00.4 mg/dL   GFR, Estimated >59 >97 mL/min   Anion gap 18 (H) 5 - 15  CBG monitoring, ED     Status: Abnormal   Collection Time: 09/05/21  4:25 PM  Result Value Ref Range   Glucose-Capillary 121 (H) 70 - 99 mg/dL  Glucose, capillary     Status: Abnormal   Collection Time: 09/05/21  8:33 PM  Result Value Ref Range   Glucose-Capillary 107 (H) 70 - 99 mg/dL  Glucose, capillary     Status: Abnormal   Collection Time: 09/05/21 11:31 PM  Result Value Ref Range   Glucose-Capillary 112 (H) 70 - 99 mg/dL  Glucose, capillary     Status: None   Collection Time: 09/06/21  4:02 AM  Result Value Ref Range   Glucose-Capillary 99 70 - 99 mg/dL  HIV Antibody (routine testing w rflx)     Status: None   Collection Time: 09/06/21  5:50 AM  Result Value Ref Range   HIV Screen 4th Generation wRfx Non Reactive Non Reactive  Basic metabolic panel     Status: Abnormal   Collection Time: 09/06/21  5:50 AM  Result Value Ref Range   Sodium 141 135 - 145 mmol/L   Potassium 3.1 (L) 3.5 - 5.1 mmol/L   Chloride 109 98 - 111 mmol/L   CO2 22 22 - 32 mmol/L   Glucose, Bld 110 (H) 70 - 99 mg/dL    BUN 7 (L) 8 - 23 mg/dL   Creatinine, Ser 7.41 0.44 - 1.00 mg/dL   Calcium 8.4 (L) 8.9 - 10.3 mg/dL   GFR, Estimated >42 >39 mL/min   Anion gap 10 5 - 15  CBC with Differential/Platelet     Status: Abnormal   Collection Time: 09/06/21  5:50 AM  Result  Value Ref Range   WBC 11.0 (H) 4.0 - 10.5 K/uL   RBC 3.88 3.87 - 5.11 MIL/uL   Hemoglobin 12.1 12.0 - 15.0 g/dL   HCT 01.7 (L) 79.3 - 90.3 %   MCV 88.4 80.0 - 100.0 fL   MCH 31.2 26.0 - 34.0 pg   MCHC 35.3 30.0 - 36.0 g/dL   RDW 00.9 (H) 23.3 - 00.7 %   Platelets 225 150 - 400 K/uL   nRBC 0.0 0.0 - 0.2 %   Neutrophils Relative % 61 %   Neutro Abs 6.6 1.7 - 7.7 K/uL   Lymphocytes Relative 26 %   Lymphs Abs 2.8 0.7 - 4.0 K/uL   Monocytes Relative 10 %   Monocytes Absolute 1.1 (H) 0.1 - 1.0 K/uL   Eosinophils Relative 1 %   Eosinophils Absolute 0.1 0.0 - 0.5 K/uL   Basophils Relative 1 %   Basophils Absolute 0.1 0.0 - 0.1 K/uL   Immature Granulocytes 1 %   Abs Immature Granulocytes 0.12 (H) 0.00 - 0.07 K/uL  Magnesium     Status: Abnormal   Collection Time: 09/06/21  5:50 AM  Result Value Ref Range   Magnesium 1.2 (L) 1.7 - 2.4 mg/dL  Glucose, capillary     Status: Abnormal   Collection Time: 09/06/21  7:48 AM  Result Value Ref Range   Glucose-Capillary 108 (H) 70 - 99 mg/dL  Glucose, capillary     Status: Abnormal   Collection Time: 09/06/21 11:46 AM  Result Value Ref Range   Glucose-Capillary 210 (H) 70 - 99 mg/dL    I have Reviewed nursing notes, Vitals, and Lab results since pt's last encounter. Pertinent lab results : see above I have ordered test including BMP, CBC, Mg I have reviewed the last note from staff over past 24 hours I have discussed pt's care plan and test results with nursing staff, case manager   Family Communication:   Disposition: Status is: Inpatient Remains inpatient appropriate because: Treatment as outlined in A&P   Planned Discharge Destination: Skilled nursing  facility   Antimicrobials: Rocephin 09/05/2021 >> current  Consultants:   Procedures:    DVT ppx:  enoxaparin (LOVENOX) injection 40 mg Start: 09/05/21 0800     Code Status: Full Code    Author: Lewie Chamber, MD 09/06/2021 2:44 PM  For on call review [email or web address].

## 2021-09-06 NOTE — Assessment & Plan Note (Signed)
-   Patient's son reports that patient lives at home with a friend and she has become less active notably over the past 1 year. ?-He is expecting her to likely require going to rehab at this point but possibly needing long-term care ?- PT/OT have evaluated patient and are recommending rehab at this time ?- Continue treatment for UTI and patient will probably be medically stable for discharge on Tuesday or Wednesday ?

## 2021-09-06 NOTE — TOC Initial Note (Signed)
Transition of Care (TOC) - Initial/Assessment Note  ? ? ?Patient Details  ?Name: Brittney Kennedy ?MRN: [id number] ?Date of Birth: 1951/04/29 ? ?Transition of Care (TOC) CM/SW Contact:    ?Chapman Fitch, RN ?Phone Number: ?09/06/2021, 3:38 PM ? ?Clinical Narrative:                 ?Admitted for: UTI ?Admitted from: From home with roommate ?PCP: Dareen Piano ?[facility] ?Current home health/prior home health/DME: [employer], bsc, shower seat ? ?Son lives in [place].  He is currently visiting. Per son patient uses [employer] transport or [employer] to get to appointments. Typically is able to ambulate with a rollator but has been having difficulty the last couple of months.  ? ?Therapy recommending SNF.   ?Agreeable to bed search ?Existing PASRR ?Fl2 sent for signature ?Bed search initiated  ? ?Expected Discharge Plan: Skilled Nursing Facility ?Barriers to Discharge: Continued Medical Work up ? ? ?Patient Goals and CMS Choice ?  ?  ?  ? ?Expected Discharge Plan and Services ?Expected Discharge Plan: Skilled Nursing Facility ?  ?Discharge Planning Services: CM Consult ?  ?Living arrangements for the past 2 months: Single Family Home ?                ?  ?  ?  ?  ?  ?  ?  ?  ?  ?  ? ?Prior Living Arrangements/Services ?Living arrangements for the past 2 months: Single Family Home ?Lives with:: Roommate ?Patient language and need for interpreter reviewed:: Yes ?Do you feel safe going back to the place where you live?: Yes      ?Need for Family Participation in Patient Care: Yes (Comment) ?Care giver support system in place?: Yes (comment) ?Current home services: DME ?Criminal Activity/Legal Involvement Pertinent to Current Situation/Hospitalization: No - Comment as needed ? ?Activities of Daily Living ?Home Assistive Devices/Equipment: Dentures (specify type), Walker (specify type) ?ADL Screening (condition at time of admission) ?Patient's cognitive ability adequate to safely complete daily activities?: Yes ?Is the patient deaf or have  difficulty hearing?: No ?Does the patient have difficulty seeing, even when wearing glasses/contacts?: No ?Does the patient have difficulty concentrating, remembering, or making decisions?: No ?Patient able to express need for assistance with ADLs?: Yes ?Does the patient have difficulty dressing or bathing?: No ?Independently performs ADLs?: Yes (appropriate for developmental age) ?Does the patient have difficulty walking or climbing stairs?: Yes ?Weakness of Legs: Both ?Weakness of Arms/Hands: None ? ?Permission Sought/Granted ?  ?  ?   ?   ?   ?   ? ?Emotional Assessment ?  ?  ?  ?Orientation: : Oriented to Self, Oriented to  Time, Oriented to Situation ?Alcohol / Substance Use: Not Applicable ?Psych Involvement: No (comment) ? ?Admission diagnosis:  Acute UTI [N39.0] ?High anion gap metabolic acidosis [E87.29] ?Increased anion gap metabolic acidosis [E87.29] ?Altered mental status, unspecified altered mental status type [R41.82] ?Patient Active Problem List  ? Diagnosis Date Noted  ? Physical deconditioning 09/06/2021  ? Lumbar stenosis 09/06/2021  ? High anion gap metabolic acidosis 09/05/2021  ? Acute metabolic encephalopathy 09/05/2021  ? Chronic systolic CHF (congestive heart failure) (HCC) 09/05/2021  ? HFrEF (heart failure with reduced ejection fraction) (HCC)   ? Diabetes mellitus, type 2 (HCC)   ? AKI (acute kidney injury) (HCC)   ? Osteoarthritis of left shoulder   ? Chronic left shoulder pain   ? Neurogenic bladder   ? UTI (urinary tract infection)   ? Urinary frequency   ?  Hypoalbuminemia due to protein-calorie malnutrition (HCC)   ? Hypokalemia   ? Hypomagnesemia   ? Labile blood pressure   ? Labile blood glucose   ? Acute systolic congestive heart failure (HCC)   ? Essential hypertension   ? Keratoconjunctivitis   ? Right middle cerebral artery stroke (HCC) 10/16/2019  ? Embolic infarction (HCC) 10/16/2019  ? Prediabetes   ? Benign essential HTN   ? Dysphagia, post-stroke   ? PUD (peptic ulcer  disease)   ? Acute blood loss anemia 10/08/2019  ? Upper GI bleed   ? Status post coronary artery stent placement   ? Malnutrition of moderate degree 10/02/2019  ? Cerebral embolism with cerebral infarction 10/01/2019  ? CAD (coronary artery disease) 09/27/2019  ? Acute systolic CHF (congestive heart failure) (HCC) 09/27/2019  ? NSTEMI (non-ST elevated myocardial infarction) (HCC) 09/26/2019  ? ?PCP:  Lauro Regulus, MD ?Pharmacy:   ?[facility] - [place], [place] - [facility] Portal to Reg[facility] ?[address] [facility] ?[place] [address] ?Phone: [phone] Fax: [phone] ? ? ? ? ?Social Determinants of Health (SDOH) Interventions ?  ? ?Readmission Risk Interventions ?No flowsheet data found. ? ? ?

## 2021-09-06 NOTE — Progress Notes (Signed)
Initial Nutrition Assessment ? ?DOCUMENTATION CODES:  ? ?Not applicable ? ?INTERVENTION:  ? ?Ensure Enlive po BID, each supplement provides 350 kcal and 20 grams of protein. ? ?MVI po daily  ? ?Pt at high refeed risk; recommend monitor potassium, magnesium and phosphorus labs daily until stable ? ?NUTRITION DIAGNOSIS:  ? ?Inadequate oral intake related to acute illness as evidenced by per patient/family report. ? ?GOAL:  ? ?Patient will meet greater than or equal to 90% of their needs ? ?MONITOR:  ? ?PO intake, Supplement acceptance, Labs, Weight trends, I & O's, Skin ? ?REASON FOR ASSESSMENT:  ? ?Malnutrition Screening Tool ?  ? ?ASSESSMENT:  ? ?71 y/o female with h/o DM, CHF, HTN, PUD, MI and CAD who is admitted with UTI and AMS. ? ?Met with pt in room today. Pt reports poor appetite and oral intake for one month pta. Pt reports that her appetite is improving in hospital. Pt reports that she ate a grilled cheese sandwich for lunch and reports that she has ordered her supper. Pt reports that she does not drink supplements at home but she is willing to try chocolate or strawberry Ensure in hospital. RD will add supplements and MVI to help pt meet her estimated needs. Pt is at high refeed risk. Of note, pt wears dentures. Per chart, pt is down 18lbs(10%) since June; RD unsure how recently weight loss occurred.  ? ?Medications reviewed and include: plavix, lovenox, insulin, protonix, ceftriaxone, LRS _0 /hr ? ?Labs reviewed: K 3.1(L), BUN 7(L), Mg 1.2(L) ?Wbc- 11.0(H) ?Cbgs- 210, 108, 99 x 24 hrs ?AIC 6.0(H)- 3/5 ? ?NUTRITION - FOCUSED PHYSICAL EXAM: ? ?Flowsheet Row Most Recent Value  ?Orbital Region No depletion  ?Upper Arm Region No depletion  ?Thoracic and Lumbar Region No depletion  ?Buccal Region No depletion  ?Temple Region No depletion  ?Clavicle Bone Region Mild depletion  ?Clavicle and Acromion Bone Region Mild depletion  ?Scapular Bone Region Mild depletion  ?Dorsal Hand No depletion  ?Patellar Region  Moderate depletion  ?Anterior Thigh Region Moderate depletion  ?Posterior Calf Region Moderate depletion  ?Edema (RD Assessment) None  ?Hair Reviewed  ?Eyes Reviewed  ?Mouth Reviewed  ?Skin Reviewed  ?Nails Reviewed  ? ?Diet Order:   ?Diet Order   ? ?       ?  Diet Carb Modified Fluid consistency: Thin; Room service appropriate? Yes  Diet effective now       ?  ? ?  ?  ? ?  ? ?EDUCATION NEEDS:  ? ?Education needs have been addressed ? ?Skin:  Skin Assessment: Reviewed RN Assessment ? ?Last BM:  3/4 ? ?Height:  ? ?Ht Readings from Last 1 Encounters:  ?09/05/21 _1  (1.575 m)  ? ? ?Weight:  ? ?Wt Readings from Last 1 Encounters:  ?09/05/21 70.4 kg  ? ? ?Ideal Body Weight:  50 kg ? ?BMI:  Body mass index is 28.39 kg/m?. ? ?Estimated Nutritional Needs:  ? ?Kcal:  1500-1700kcal/day ? ?Protein:  75-85g/day ? ?Fluid:  1.3-1.5L/day ? ?Koleen Distance MS, RD, LDN ?Please refer to [facility]ION for RD and/or RD on-call/weekend/after hours pager ? ?

## 2021-09-06 NOTE — Evaluation (Signed)
Occupational Therapy Evaluation Patient Details Name: Brittney Kennedy MRN: [id number] DOB: 06-02-1951 Today's Date: 09/06/2021   History of Present Illness Pt is a 71 y.o. female who presents to ED w/ complaints of weakness and AMS. Admitted for metabolic acidosis, AKI, UTI, and acute metabolic encephalopathy. PmHx: CAD, T2DM, HTN, HFrEF, Left-sided weakness from prior stroke 3/21.   Clinical Impression   Pt seen for OT evaluation this date. Upon arrival to room, pt awake and seated upright in recliner reporting urge to urinate (pt forgetting that she had Purewick on, but agreeable to attempt transfer to/from [facility]). Pt's son [name]) and roommate present throughout. At baseline, pt performs toileting/grooming tasks independently, receives MIN A from roommate for bathing and dressing, and uses a rollator for functional mobility of household distances (son reports pt was walking to/from mailbox until her fall in Nov '22). Pt currently requires MOD/MIN A for transfers to/from [facility] via stand pivot transfer, MIN GUARD for seated peri-care, and MAX A for sit>stand LB dressing d/t current functional impairments (See OT Problem List below). Son is eager for pt to return to PLOF. Due to functional impairments listed above, pt would benefit from additional skilled OT services to maximize return to PLOF and minimize risk of future falls, injury, caregiver burden, and readmission. Upon discharge, recommend SNF unless pt's family/roommate are able to provide A LOT of physical assistance for ADLs and functional mobility.    Recommendations for follow up therapy are one component of a multi-disciplinary discharge planning process, led by the attending physician.  Recommendations may be updated based on patient status, additional functional criteria and insurance authorization.   Follow Up Recommendations  Skilled nursing-short term rehab (<3 hours/day)    Assistance Recommended at Discharge Intermittent  Supervision/Assistance  Patient can return home with the following A lot of help with walking and/or transfers;A lot of help with bathing/dressing/bathroom;Assistance with cooking/housework    Functional Status Assessment  Patient has had a recent decline in their functional status and demonstrates the ability to make significant improvements in function in a reasonable and predictable amount of time.  Equipment Recommendations  None recommended by OT       Precautions / Restrictions Precautions Precautions: Fall Restrictions Weight Bearing Restrictions: No      Mobility Bed Mobility               General bed mobility comments: not assessed, pt in recliner pre/post session    Transfers Overall transfer level: Needs assistance Equipment used: Rolling walker (2 wheels) Transfers: Sit to/from Stand, Bed to chair/wheelchair/BSC Sit to Stand: Min assist, Mod assist Stand pivot transfers: Mod assist, Min assist         General transfer comment: Required MOD A to stand pivot transfer to [facility] (placed on L side). Progressed to requiring only MIN A during transfer from [facility] to recliner (recliner placed on R side)      Balance Overall balance assessment: Needs assistance Sitting-balance support: No upper extremity supported, Feet supported Sitting balance-Leahy Scale: Fair Sitting balance - Comments: requires supervision for reaching within BOS   Standing balance support: Bilateral upper extremity supported, During functional activity, Reliant on assistive device for balance Standing balance-Leahy Scale: Fair Standing balance comment: Able to maintain static standing balance with MIN GUARD                           ADL either performed or assessed with clinical judgement   ADL Overall ADL's : Needs  assistance/impaired                     Lower Body Dressing: Maximal assistance;Sit to/from stand Lower Body Dressing Details (indicate cue type and reason):  requires assistance to don/doff underwear Toilet Transfer: Moderate assistance;Minimal assistance;Stand-pivot;BSC/3in1;Rolling walker (2 wheels) Toilet Transfer Details (indicate cue type and reason): Requires MOD A to transfer to [facility] (placed on L side). Progressed to requiring only MIN A during transfer FROM BSC to recliner (recliner placed on R side) Toileting- Clothing Manipulation and Hygiene: Min guard;Sitting/lateral lean               Vision Ability to See in Adequate Light: 0 Adequate Patient Visual Report: No change from baseline              Pertinent Vitals/Pain Pain Assessment Pain Assessment: Faces Faces Pain Scale: Hurts a little bit Pain Location: L hip/knee Pain Descriptors / Indicators: Aching Pain Intervention(s): Limited activity within patient's tolerance, Monitored during session     Hand Dominance     Extremity/Trunk Assessment Upper Extremity Assessment Upper Extremity Assessment: Generalized weakness (baseline L-sided weakness from prior stroke)   Lower Extremity Assessment Lower Extremity Assessment: Generalized weakness;Defer to PT evaluation (baseline L-sided weakness from prior stroke)       Communication Communication Communication: Other (comment) (slurred speech)   Cognition Arousal/Alertness: Awake/alert Behavior During Therapy: WFL for tasks assessed/performed Overall Cognitive Status: Difficult to assess                                 General Comments: Requires increased processing time. Difficult to fully assess d/t slurred speech, however family/friend confirming that all PLOF information provided by pt correct.                Home Living Family/patient expects to be discharged to:: Private residence Living Arrangements: Non-relatives/Friends (lives with roommate/friend) Available Help at Discharge: Family Type of Home: House Home Access: Stairs to enter     Home Layout: One level     Bathroom  Shower/Tub: Producer, television/film/video: Standard     Home Equipment: Rollator (4 wheels);BSC/3in1;Shower seat;Grab bars - tub/shower   Additional Comments: Some home set-up information obtained from prior admission; confirm with pt/family when able      Prior Functioning/Environment Prior Level of Function : Needs assist       Physical Assist : ADLs (physical)   ADLs (physical): Bathing;Dressing;IADLs Mobility Comments: Pt reports that she was using rollator for functional mobility of household distances (son reports pt was walking to/from mailbox until her fall in Nov '22) ADLs Comments: Pt reports that she requires MIN A from friend/roommate for bathing (to dry off) and dressing. Pt reports she is independent with toileting        OT Problem List: Decreased strength;Decreased activity tolerance;Impaired balance (sitting and/or standing);Decreased safety awareness;Pain      OT Treatment/Interventions: Self-care/ADL training;Therapeutic exercise;Energy conservation;Therapeutic activities;DME and/or AE instruction;Patient/family education;Balance training    OT Goals(Current goals can be found in the care plan section) Acute Rehab OT Goals Patient Stated Goal: to regain functional independence OT Goal Formulation: With patient/family Time For Goal Achievement: 09/20/21 Potential to Achieve Goals: Good ADL Goals Pt Will Perform Grooming: with supervision;standing Pt Will Transfer to Toilet: with supervision;stand pivot transfer;bedside commode Pt Will Perform Toileting - Clothing Manipulation and hygiene: with supervision;with set-up;sitting/lateral leans  OT Frequency: Min 2X/week  AM-PAC OT "6 Clicks" Daily Activity     Outcome Measure Help from another person eating meals?: None Help from another person taking care of personal grooming?: A Little Help from another person toileting, which includes using toliet, bedpan, or urinal?: A Lot Help from another  person bathing (including washing, rinsing, drying)?: A Lot Help from another person to put on and taking off regular upper body clothing?: A Little Help from another person to put on and taking off regular lower body clothing?: A Lot 6 Click Score: 16   End of Session Equipment Utilized During Treatment: Gait belt;Rolling walker (2 wheels) Nurse Communication: Mobility status  Activity Tolerance: Patient tolerated treatment well Patient left: in bed;with call bell/phone within reach;with chair alarm set;with family/visitor present  OT Visit Diagnosis: Unsteadiness on feet (R26.81);History of falling (Z91.81);Muscle weakness (generalized) (M62.81);Pain Pain - Right/Left: Left Pain - part of body: Leg                Time: 5397-6734 OT Time Calculation (min): 30 min Charges:  OT General Charges $OT Visit: 1 Visit OT Evaluation $OT Eval Moderate Complexity: 1 Mod OT Treatments $Self Care/Home Management : 8-22 mins  Matthew Folks, OTR/L [facility] [phone]

## 2021-09-06 NOTE — Assessment & Plan Note (Signed)
-   Recent MRI L-spine on 08/26/2021 showing L3/4 mild stenosis with disc bulge affecting L3 nerve exit. L4/5 mild B/L neural foraminal narrowing. L5 exiting new root is laterally displaced by asymmetric disc bulge ?- needs ongoing PT ?- son wishes for patient to not have too much narcotic as she has history of pain seeking tendency ?- continue Tylenol PRN; d/c morphine and only use tramadol PRN ?

## 2021-09-06 NOTE — NC FL2 (Signed)
?[place] MEDICAID FL2 LEVEL OF CARE SCREENING TOOL  ?  ? ?IDENTIFICATION  ?Patient Name: ?Brittney Kennedy Birthdate: Nov 16, 1950 Sex: female Admission Date (Current Location): ?09/04/2021  ?[place] Number: ? [place] ?  Facility and Address:  ?  ?     Provider Number: ?[id number]  ?Attending Physician Name and Address:  ?Lewie Chamber, MD ? Relative Name and Phone Number:  ?  ?   ?Current Level of Care: ?Hospital Recommended Level of Care: ?Skilled Nursing Facility Prior Approval Number: ?  ? ?Date Approved/Denied: ?  PASRR Number: ?[id number] A ? ?Discharge Plan: ?SNF ?  ? ?Current Diagnoses: ?Patient Active Problem List  ? Diagnosis Date Noted  ? Physical deconditioning 09/06/2021  ? Lumbar stenosis 09/06/2021  ? High anion gap metabolic acidosis 09/05/2021  ? Acute metabolic encephalopathy 09/05/2021  ? Chronic systolic CHF (congestive heart failure) (HCC) 09/05/2021  ? HFrEF (heart failure with reduced ejection fraction) (HCC)   ? Diabetes mellitus, type 2 (HCC)   ? AKI (acute kidney injury) (HCC)   ? Osteoarthritis of left shoulder   ? Chronic left shoulder pain   ? Neurogenic bladder   ? UTI (urinary tract infection)   ? Urinary frequency   ? Hypoalbuminemia due to protein-calorie malnutrition (HCC)   ? Hypokalemia   ? Hypomagnesemia   ? Labile blood pressure   ? Labile blood glucose   ? Acute systolic congestive heart failure (HCC)   ? Essential hypertension   ? Keratoconjunctivitis   ? Right middle cerebral artery stroke (HCC) 10/16/2019  ? Embolic infarction (HCC) 10/16/2019  ? Prediabetes   ? Benign essential HTN   ? Dysphagia, post-stroke   ? PUD (peptic ulcer disease)   ? Acute blood loss anemia 10/08/2019  ? Upper GI bleed   ? Status post coronary artery stent placement   ? Malnutrition of moderate degree 10/02/2019  ? Cerebral embolism with cerebral infarction 10/01/2019  ? CAD (coronary artery disease) 09/27/2019  ? Acute systolic CHF (congestive heart failure) (HCC) 09/27/2019  ? NSTEMI  (non-ST elevated myocardial infarction) (HCC) 09/26/2019  ? ? ?Orientation RESPIRATION BLADDER Height & Weight   ?  ?Self, Situation, Place ? Normal Incontinent, External catheter Weight: 70.4 kg ?Height:  5\' 2"  (157.5 cm)  ?BEHAVIORAL SYMPTOMS/MOOD NEUROLOGICAL BOWEL NUTRITION STATUS  ?    Incontinent Diet (Carb modified)  ?AMBULATORY STATUS COMMUNICATION OF NEEDS Skin   ?Extensive Assist Verbally Normal ?  ?  ?  ?    ?     ?     ? ? ?Personal Care Assistance Level of Assistance  ?    ?  ?  ?   ? ?Functional Limitations Info  ?    ?  ?   ? ? ?SPECIAL CARE FACTORS FREQUENCY  ?PT (By licensed PT), OT (By licensed OT)   ?  ?  ?  ?  ?  ?  ?   ? ? ?Contractures Contractures Info: Not present  ? ? ?Additional Factors Info  ?Code Status, Allergies Code Status Info: FULL ?Allergies Info: Onion, Monosodium Glutamate ?  ?  ?  ?   ? ?Current Medications (09/06/2021):  This is the current hospital active medication list ?Current Facility-Administered Medications  ?Medication Dose Route Frequency Provider Last Rate Last Admin  ? acetaminophen (TYLENOL) tablet 650 mg  650 mg Oral Q4H PRN 11/06/2021, MD      ? carvedilol (COREG) tablet 25 mg  25 mg Oral BID Lewie Chamber, MD   25  mg at 09/06/21 3149  ? cefTRIAXone (ROCEPHIN) 1 g in sodium chloride 0.9 % 100 mL IVPB  1 g Intravenous Q24H Andris Baumann, MD   Stopped at 09/06/21 0135  ? clopidogrel (PLAVIX) tablet 75 mg  75 mg Oral Daily Lewie Chamber, MD   75 mg at 09/06/21 7026  ? enoxaparin (LOVENOX) injection 40 mg  40 mg Subcutaneous Q24H Andris Baumann, MD   40 mg at 09/06/21 3785  ? insulin aspart (novoLOG) injection 0-9 Units  0-9 Units Subcutaneous Q4H Andris Baumann, MD   3 Units at 09/06/21 1231  ? lactated ringers infusion   Intravenous Continuous Lewie Chamber, MD 100 mL/hr at 09/06/21 0819 New Bag at 09/06/21 0819  ? ondansetron (ZOFRAN) tablet 4 mg  4 mg Oral Q6H PRN Andris Baumann, MD      ? Or  ? ondansetron [facility]) injection 4 mg  4 mg Intravenous Q6H  PRN Andris Baumann, MD      ? pantoprazole (PROTONIX) EC tablet 40 mg  40 mg Oral Daily Lewie Chamber, MD   40 mg at 09/06/21 8850  ? traMADol (ULTRAM) tablet 50 mg  50 mg Oral Q6H PRN Lewie Chamber, MD   50 mg at 09/06/21 1231  ? ? ? ?Discharge Medications: ?Please see discharge summary for a list of discharge medications. ? ?Relevant Imaging Results: ? ?Relevant Lab Results: ? ? ?Additional Information ?SS#: [id number] ? ?Chapman Fitch, RN ? ? ? ? ?

## 2021-09-06 NOTE — Evaluation (Signed)
Physical Therapy Evaluation ?Patient Details ?Name: Brittney Kennedy ?MRN: [id number] ?DOB: 1950/07/17 ?Today's Date: 09/06/2021 ? ?History of Present Illness ? Pt is a 71 y.o. female who presents to ED w/ complaints of weakness and AMS. Admitted for metabolic acidosis, AKI, UTI, and acute metabolic encephalopathy. PmHx: CAD, T2DM, HTN, HFrEF, Left-sided weakness from prior stroke 3/21. ?  ?Clinical Impression ? Pt is awake and resting in bed, beginning to eat her breakfast, upon PT entrance into room for evaluation. Pt is A&Ox4 and denies any c/o pain at rest. Family (son and roommate) enter room during evaluation and are able to help provide Pt's PLOF. Prior to hospitalization Pt was living in a 1-story home w/ her roommate. Pt used a rollator at baseline as well as required assistance PRN from roommate for ADLs.  ? ?Pt was able to perform bed mobility w/ minA; required assistance for trunk elevation, but was not necessary. Pt was able to sit EOB and progressed to sit to stand w/ minA using RW. Pt was able to take a few side-steps to transfer to recliner w/CGA and RW, but impulsively began to sit down in recliner before she was in a safe and proper position due to Pt report of "My legs are hurting and are tired, theyre just giving out". Pt will benefit from continued skilled PT in order to increase LE strength/endurance, improve mobility/gait, and restore PLOF. Current discharge recommendation to SNF is appropriate due to the level of assistance required by the patient to ensure safety and improve overall function. ?   ?   ? ?Recommendations for follow up therapy are one component of a multi-disciplinary discharge planning process, led by the attending physician.  Recommendations may be updated based on patient status, additional functional criteria and insurance authorization. ? ?Follow Up Recommendations Skilled nursing-short term rehab (<3 hours/day) ? ?  ?Assistance Recommended at Discharge Intermittent  Supervision/Assistance  ?Patient can return home with the following ? A little help with walking and/or transfers;A little help with bathing/dressing/bathroom;Assistance with cooking/housework;Assist for transportation;Help with stairs or ramp for entrance;Direct supervision/assist for medications management ? ?  ?Equipment Recommendations Rolling walker (2 wheels)  ?Recommendations for Other Services ?    ?  ?Functional Status Assessment Patient has had a recent decline in their functional status and demonstrates the ability to make significant improvements in function in a reasonable and predictable amount of time.  ? ?  ?Precautions / Restrictions Precautions ?Precautions: Fall ?Restrictions ?Weight Bearing Restrictions: No  ? ?  ? ?Mobility ? Bed Mobility ?Overal bed mobility: Needs Assistance ?Bed Mobility: Supine to Sit ?  ?  ?Supine to sit: Min assist ?  ?  ?  ?  ? ?Transfers ?Overall transfer level: Needs assistance ?Equipment used: Rolling walker (2 wheels) ?Transfers: Sit to/from Stand, Bed to chair/wheelchair/BSC ?Sit to Stand: Min assist ?  ?Step pivot transfers: Min guard ?  ?  ?  ?General transfer comment: CGA for step pivot w/ RW to recliner ?  ? ?Ambulation/Gait ?  ?  ?  ?  ?  ?  ?  ?  ? ?Stairs ?  ?  ?  ?  ?  ? ?Wheelchair Mobility ?  ? ?Modified Rankin (Stroke Patients Only) ?  ? ?  ? ?Balance Overall balance assessment: Needs assistance ?Sitting-balance support: Feet supported, Bilateral upper extremity supported ?Sitting balance-Leahy Scale: Fair ?  ?  ?Standing balance support: Bilateral upper extremity supported, During functional activity, Reliant on assistive device for balance ?Standing balance-Leahy Scale: Fair ?  ?  ?  ?  ?  ?  ?  ?  ?  ?  ?  ?  ?   ? ? ? ?  Pertinent Vitals/Pain Pain Assessment ?Pain Assessment: Faces ?Faces Pain Scale: Hurts a little bit ?Pain Intervention(s): Limited activity within patient's tolerance, Monitored during session  ? ? ?Home Living Family/patient expects to  be discharged to:: Private residence ?Living Arrangements: Non-relatives/Friends (lives w/ roommate/friend) ?Available Help at Discharge: Family ?Type of Home: House ?Home Access: Stairs to enter ?  ?  ?  ?Home Layout: One level ?Home Equipment: Rollator (4 wheels);BSC/3in1;Shower seat;Grab bars - tub/shower ?Additional Comments: Some home set-up information obtained from prior admission; confirm with pt/family when able  ?  ?Prior Function Prior Level of Function : Needs assist ?  ?  ?  ?Physical Assist : ADLs (physical) ?  ?ADLs (physical): Bathing;Dressing;IADLs ?Mobility Comments: Pt reports that she was using rollator for functional mobility of household distances (son reports pt was walking to/from mailbox until her fall in Nov '22) ?ADLs Comments: Pt reports that she requires MIN A from friend/roommate for bathing (to dry off) and dressing. Pt reports she is independent with toileting ?  ? ? ?Hand Dominance  ?   ? ?  ?Extremity/Trunk Assessment  ? Upper Extremity Assessment ?Upper Extremity Assessment: Generalized weakness (baseline L-sided weakness from prior stroke) ?  ? ?Lower Extremity Assessment ?Lower Extremity Assessment: Generalized weakness ?LLE Deficits / Details: baseline L-sided weakness from prior stroke ?  ? ?   ?Communication  ? Communication: Other (comment) (slurred speech)  ?Cognition Arousal/Alertness: Awake/alert ?Behavior During Therapy: [facility] for tasks assessed/performed ?Overall Cognitive Status: Difficult to assess ?  ?  ?  ?  ?  ?  ?  ?  ?  ?  ?  ?  ?  ?  ?  ?  ?General Comments: Requires increased processing time. Difficult to fully assess d/t slurred speech, however family/friend confirming that all PLOF information provided by pt correct. ?  ?  ? ?  ?General Comments   ? ?  ?Exercises    ? ?Assessment/Plan  ?  ?PT Assessment Patient needs continued PT services  ?PT Problem List Decreased strength;Decreased mobility;Decreased safety awareness;Decreased range of motion;Decreased  coordination;Decreased activity tolerance;Decreased balance;Decreased cognition;Pain ? ?   ?  ?PT Treatment Interventions DME instruction;Therapeutic exercise;Gait training;Balance training;Stair training;Neuromuscular re-education;Functional mobility training;Therapeutic activities;Patient/family education   ? ?PT Goals (Current goals can be found in the Care Plan section)  ?Acute Rehab PT Goals ?Patient Stated Goal: decrease pain and increase strength to go home ?PT Goal Formulation: With patient ?Time For Goal Achievement: 09/20/21 ?Potential to Achieve Goals: Good ? ?  ?Frequency Min 2X/week ?  ? ? ?Co-evaluation   ?  ?  ?  ?  ? ? ?  ?AM-PAC PT "6 Clicks" Mobility  ?Outcome Measure Help needed turning from your back to your side while in a flat bed without using bedrails?: A Little ?Help needed moving from lying on your back to sitting on the side of a flat bed without using bedrails?: A Little ?Help needed moving to and from a bed to a chair (including a wheelchair)?: A Little ?Help needed standing up from a chair using your arms (e.g., wheelchair or bedside chair)?: A Little ?Help needed to walk in hospital room?: A Lot ?Help needed climbing 3-5 steps with a railing? : A Lot ?6 Click Score: 16 ? ?  ?End of Session Equipment Utilized During Treatment: Gait belt ?Activity Tolerance: Patient tolerated treatment well;Patient limited by fatigue ?Patient left: in chair;with chair alarm set;with call bell/phone within reach;with family/visitor present ?Nurse Communication: Mobility status ?PT Visit Diagnosis: Unsteadiness on  feet (R26.81);History of falling (Z91.81);Other abnormalities of gait and mobility (R26.89);Muscle weakness (generalized) (M62.81);Pain ?Pain - Right/Left: Right ?Pain - part of body: Hip ?  ? ?Time: 7782-4235 ?PT Time Calculation (min) (ACUTE ONLY): 22 min ? ? ?Charges:     ?  ?  ?   ? ?Jeralyn Bennett, SPT ?09/06/2021, 1:02 PM ? ?

## 2021-09-07 LAB — BASIC METABOLIC PANEL
Anion gap: 8 (ref 5–15)
BUN: 10 mg/dL (ref 8–23)
CO2: 23 mmol/L (ref 22–32)
Calcium: 8.3 mg/dL — ABNORMAL LOW (ref 8.9–10.3)
Chloride: 107 mmol/L (ref 98–111)
Creatinine, Ser: 0.7 mg/dL (ref 0.44–1.00)
GFR, Estimated: >60 mL/min (ref >60)
Glucose, Bld: 112 mg/dL — ABNORMAL HIGH (ref 70–99)
Potassium: 4.4 mmol/L (ref 3.5–5.1)
Sodium: 138 mmol/L (ref 135–145)

## 2021-09-07 LAB — URINE CULTURE: Culture: >=100000 — AB

## 2021-09-07 LAB — PHOSPHORUS: Phosphorus: 1.4 mg/dL — ABNORMAL LOW (ref 2.5–4.6)

## 2021-09-07 LAB — GLUCOSE, CAPILLARY
Glucose-Capillary: 113 mg/dL — ABNORMAL HIGH (ref 70–99)
Glucose-Capillary: 121 mg/dL — ABNORMAL HIGH (ref 70–99)
Glucose-Capillary: 161 mg/dL — ABNORMAL HIGH (ref 70–99)
Glucose-Capillary: 162 mg/dL — ABNORMAL HIGH (ref 70–99)
Glucose-Capillary: 173 mg/dL — ABNORMAL HIGH (ref 70–99)
Glucose-Capillary: 254 mg/dL — ABNORMAL HIGH (ref 70–99)

## 2021-09-07 LAB — CBC WITH DIFFERENTIAL/PLATELET
Abs Immature Granulocytes: 0.11 10*3/uL — ABNORMAL HIGH (ref 0.00–0.07)
Basophils Absolute: 0.1 10*3/uL (ref 0.0–0.1)
Basophils Relative: 1 %
Eosinophils Absolute: 0.2 10*3/uL (ref 0.0–0.5)
Eosinophils Relative: 2 %
HCT: 32.2 % — ABNORMAL LOW (ref 36.0–46.0)
Hemoglobin: 11 g/dL — ABNORMAL LOW (ref 12.0–15.0)
Immature Granulocytes: 1 %
Lymphocytes Relative: 25 %
Lymphs Abs: 2.1 10*3/uL (ref 0.7–4.0)
MCH: 30.8 pg (ref 26.0–34.0)
MCHC: 34.2 g/dL (ref 30.0–36.0)
MCV: 90.2 fL (ref 80.0–100.0)
Monocytes Absolute: 1 10*3/uL (ref 0.1–1.0)
Monocytes Relative: 11 %
Neutro Abs: 5.2 10*3/uL (ref 1.7–7.7)
Neutrophils Relative %: 60 %
Platelets: 211 10*3/uL (ref 150–400)
RBC: 3.57 MIL/uL — ABNORMAL LOW (ref 3.87–5.11)
RDW: 15.9 % — ABNORMAL HIGH (ref 11.5–15.5)
WBC: 8.7 10*3/uL (ref 4.0–10.5)
nRBC: 0 % (ref 0.0–0.2)

## 2021-09-07 LAB — MAGNESIUM: Magnesium: 2.2 mg/dL (ref 1.7–2.4)

## 2021-09-07 MED ORDER — POTASSIUM PHOSPHATES 15 MMOLE/5ML IV SOLN
30.0000 mmol | Freq: Once | INTRAVENOUS | Status: AC
  Administered 2021-09-07: 30 mmol via INTRAVENOUS
  Filled 2021-09-07: qty 10

## 2021-09-07 MED ORDER — AMOXICILLIN-POT CLAVULANATE 875-125 MG PO TABS
1.0000 | ORAL_TABLET | Freq: Two times a day (BID) | ORAL | Status: DC
  Administered 2021-09-08: 1 via ORAL
  Filled 2021-09-07: qty 1

## 2021-09-07 MED ORDER — MAGNESIUM GLUCONATE 500 MG PO TABS
500.0000 mg | ORAL_TABLET | Freq: Three times a day (TID) | ORAL | Status: DC
  Administered 2021-09-07 – 2021-09-08 (×3): 500 mg via ORAL
  Filled 2021-09-07 (×5): qty 1

## 2021-09-07 MED ORDER — DICLOFENAC SODIUM 1 % EX GEL
2.0000 g | Freq: Every day | CUTANEOUS | Status: DC | PRN
  Filled 2021-09-07: qty 100

## 2021-09-07 MED ORDER — INSULIN ASPART 100 UNIT/ML IJ SOLN
0.0000 [IU] | Freq: Three times a day (TID) | INTRAMUSCULAR | Status: DC
  Administered 2021-09-07 – 2021-09-08 (×2): 2 [IU] via SUBCUTANEOUS
  Administered 2021-09-08: 1 [IU] via SUBCUTANEOUS
  Filled 2021-09-07 (×3): qty 1

## 2021-09-07 MED ORDER — ATORVASTATIN CALCIUM 20 MG PO TABS
80.0000 mg | ORAL_TABLET | Freq: Every day | ORAL | Status: DC
Start: 2021-09-07 — End: 2021-09-08
  Administered 2021-09-07: 80 mg via ORAL
  Filled 2021-09-07: qty 4

## 2021-09-07 MED ORDER — METFORMIN HCL 500 MG PO TABS
500.0000 mg | ORAL_TABLET | Freq: Every day | ORAL | Status: DC
  Administered 2021-09-08: 500 mg via ORAL
  Filled 2021-09-07: qty 1

## 2021-09-07 MED ORDER — K PHOS MONO-SOD PHOS DI & MONO 155-852-130 MG PO TABS
250.0000 mg | ORAL_TABLET | Freq: Once | ORAL | Status: AC
  Administered 2021-09-07: 250 mg via ORAL
  Filled 2021-09-07: qty 1

## 2021-09-07 MED ORDER — POLYVINYL ALCOHOL 1.4 % OP SOLN
1.0000 [drp] | OPHTHALMIC | Status: DC | PRN
  Administered 2021-09-07: 1 [drp] via OPHTHALMIC
  Filled 2021-09-07: qty 15

## 2021-09-07 MED ORDER — OXYBUTYNIN CHLORIDE ER 10 MG PO TB24
10.0000 mg | ORAL_TABLET | Freq: Every day | ORAL | Status: DC
  Administered 2021-09-07 – 2021-09-08 (×2): 10 mg via ORAL
  Filled 2021-09-07 (×2): qty 1

## 2021-09-07 NOTE — TOC Progression Note (Addendum)
Transition of Care (TOC) - Progression Note  ? ? ?Patient Details  ?Name: Brittney Kennedy ?MRN: [id number] ?Date of Birth: 1951-03-08 ? ?Transition of Care (TOC) CM/SW Contact  ?Chapman Fitch, RN ?Phone Number: ?09/07/2021, 9:47 AM ? ?Clinical Narrative:    ?Bed offer of [place] presented to son ? ?Confirmed with [facility] and [facility] that they are in network with [facility].  Asked for both of their admissions coordinator to review. Son would like to wait to hear back from these facilities before making a decision ? ?Update:  [facility] is able to offer.  Son to call me back with his decision.  ?Reached out to MD to determine when patient is anticipated medically ready for DC ?Will Need [facility] for SNF ?Expected Discharge Plan: Skilled Nursing Facility ?Barriers to Discharge: Continued Medical Work up ? ?Expected Discharge Plan and Services ?Expected Discharge Plan: Skilled Nursing Facility ?  ?Discharge Planning Services: CM Consult ?  ?Living arrangements for the past 2 months: Single Family Home ?                ?  ?  ?  ?  ?  ?  ?  ?  ?  ?  ? ? ?Social Determinants of Health (SDOH) Interventions ?  ? ?Readmission Risk Interventions ?No flowsheet data found. ? ?

## 2021-09-07 NOTE — TOC Progression Note (Signed)
Transition of Care (TOC) - Progression Note  ? ? ?Patient Details  ?Name: Brittney Kennedy ?MRN: [id number] ?Date of Birth: 12-30-1950 ? ?Transition of Care (TOC) CM/SW Contact  ?Beverly Sessions, RN ?Phone Number: ?09/07/2021, 12:30 PM ? ?Clinical Narrative:    ?[facility] ?[facility] CMA will start R[facility] Bread today  ? ? ?Expected Discharge Plan: [place] ?Barriers to Discharge: Continued Medical Work up ? ?Expected Discharge Plan and Services ?Expected Discharge Plan: [place] ?  ?Discharge Planning Services: CM Consult ?  ?Living arrangements for the past 2 months: [place] ?                ?  ?  ?  ?  ?  ?  ?  ?  ?  ?  ? ? ?Social Determinants of Health (SDOH) Interventions ?  ? ?Readmission Risk Interventions ?No flowsheet data found. ? ?

## 2021-09-07 NOTE — Progress Notes (Signed)
[facility]  - [place] at [facility] ? ? ?PATIENT NAME: Brittney Kennedy   ? ?MR#:  [id number] ? ?DATE OF BIRTH:  06-18-1951 ? ?SUBJECTIVE:  ? ?patient overall improving. Son at bedside. Will sleepy since patient tells me somebody around the floor was making noise could not catch up on her sleep. Denies any other complaints. Working with physical therapy. No fever ? ? ?VITALS:  ?Blood pressure 114/61, pulse 63, temperature 98.5 ?F (36.9 ?C), resp. rate 18, height 5\' 2"  (1.575 m), weight 70.4 kg, SpO2 95 %. ? ?PHYSICAL EXAMINATION:  ? ?GENERAL:  71 y.o.-year-old patient lying in the bed with no acute distress. Obese ?LUNGS: Normal breath sounds bilaterally, no wheezing, rales, rhonchi.  ?CARDIOVASCULAR: S1, S2 normal. No murmurs, rubs, or gallops.  ?ABDOMEN: Soft, nontender, nondistended. Bowel sounds present.  ?EXTREMITIES: No  edema b/l.    ?NEUROLOGIC: nonfocal  patient is alert and awake. weak ?SKIN: No obvious rash, lesion, or ulcer.  ? ?LABORATORY PANEL:  ?CBC ?Recent Labs  ?Lab 09/07/21 ?0445  ?WBC 8.7  ?HGB 11.0*  ?HCT 32.2*  ?PLT 211  ? ? ?Chemistries  ?Recent Labs  ?Lab 09/04/21 ?1849 09/05/21 ?11/05/21 09/07/21 ?0445  ?NA 140   < > 138  ?K 3.5   < > 4.4  ?CL 116*   < > 107  ?CO2 8*   < > 23  ?GLUCOSE 121*   < > 112*  ?BUN 13   < > 10  ?CREATININE 1.10*   < > 0.70  ?CALCIUM 10.3   < > 8.3*  ?MG  --    < > 2.2  ?AST 39  --   --   ?ALT 18  --   --   ?ALKPHOS 46  --   --   ?BILITOT 0.7  --   --   ? < > = values in this interval not displayed.  ? ?Cardiac Enzymes ?No results for input(s): TROPONINI in the last 168 hours. ?RADIOLOGY:  ?No results found. ? ?Assessment and Plan ?Ms. 11/07/21 is a 71 yo female with PMH CAD, DMII, HFrEF, HTN, PUD, CVA who presented to the ER with weakness and altered mental status.  She was reported to have some slurring of speech and confusion as well as some recent diarrhea.  No sick contacts were noted.  She has not been on any recent antibiotics. ?UA was suggestive of  infection and she was started on Rocephin for presumed UTI. ? ?E. coli UTI ?-- patient was on IV Rocephin now switch to oral Augmentin ?-- afebrile ?-- denies any urinary symptoms ?-- urine culture growing E. Coli ? ?acute kidney injury ?acute metabolic acidosis ?-- baseline creatinine 0.7 ?-- presents with creatinine of 1.1 on admission ?-- status post bicarb drip ?-- patient drinking adequate fluids. Will DC IV fluids ? ?acute metabolic encephalopathy ?-- multifactorial ?--- mentation much improved and almost back to baseline percent ? ?generalized weakness with physical deconditioning due to chronic DJD ?-- patient will be going to rehab ? ?type II diabetes ?-- continue SSI and resume metformin/glipizide at discharge ? ?Hypertension ?-- stable ?-- resume Coreg ? ?overall improving remain stable will discharged to rehab tomorrow or when insurance authorization available ? ? ?Procedures: ?Family communication : son at bedside ?Consults : none ?CODE STATUS: full ?DVT Prophylaxis : ?Level of care: Med-Surg ?Status is: Inpatient enoxaparin ?Remains inpatient appropriate because: patient medical improving and stable for discharge once insurance authorization available. ? ?  ? ?TOTAL TIME TAKING CARE  OF THIS PATIENT: 25 minutes.  ?>50% time spent on counselling and coordination of care ? ?Note: This dictation was prepared with Dragon dictation along with smaller phrase technology. Any transcriptional errors that result from this process are unintentional. ? ?Enedina Finner M.D  ? ? ?[facility]  ? ?CC: ?Primary care physician; Lauro Regulus, MD  ?

## 2021-09-07 NOTE — Progress Notes (Signed)
Physical Therapy Treatment ?Patient Details ?Name: Brittney Kennedy ?MRN: [id number] ?DOB: 1950-10-18 ?Today's Date: 09/07/2021 ? ? ?History of Present Illness Pt is a 71 y.o. female who presents to ED w/ complaints of weakness and AMS. Admitted for metabolic acidosis, AKI, UTI, and acute metabolic encephalopathy. PmHx: CAD, T2DM, HTN, HFrEF, Left-sided weakness from prior stroke 3/21. ? ?  ?PT Comments  ? ? Pt awake and alert resting in bed upon PT entrance into room for today's session. She reports c/o of R hip pain at rest, but does not provide a quantitative value; grimaces and moans throughout session. She can perform bed mobility w/ minA to sit EOB; relying but not necessary assistance from PT for trunk elevation. Once seated EOB she can perform sit to stand w/ minA using RW to transfer to recliner. During step-pivot transfer w/ CGA and RW, Pt complains throughout about R leg pain stating "it feels like its going to give out", however, no buckling is noted. Pt sits in recliner and refuses to attempt any ambulation, instead is willing to perform LE exercises (see "General LE Exercise" section) in recliner. Pt will benefit from continued skilled PT in order to increase LE strength/endurance, improve mobility/gait, and restore PLOF. Current discharge recommendation remains appropriate due to the level of assistance required by the patient to ensure safety and improve overall function. ?  ?Recommendations for follow up therapy are one component of a multi-disciplinary discharge planning process, led by the attending physician.  Recommendations may be updated based on patient status, additional functional criteria and insurance authorization. ? ?Follow Up Recommendations ? Skilled nursing-short term rehab (<3 hours/day) ?  ?  ?Assistance Recommended at Discharge Intermittent Supervision/Assistance  ?Patient can return home with the following A little help with walking and/or transfers;A little help with  bathing/dressing/bathroom;Assistance with cooking/housework;Assist for transportation;Help with stairs or ramp for entrance;Direct supervision/assist for medications management ?  ?Equipment Recommendations ? Rolling walker (2 wheels)  ?  ?Recommendations for Other Services   ? ? ?  ?Precautions / Restrictions Precautions ?Precautions: Fall ?Restrictions ?Weight Bearing Restrictions: No  ?  ? ?Mobility ? Bed Mobility ?Overal bed mobility: Needs Assistance ?Bed Mobility: Supine to Sit ?  ?  ?Supine to sit: Min assist ?  ?  ?  ?  ? ?Transfers ?Overall transfer level: Needs assistance ?Equipment used: Rolling walker (2 wheels) ?Transfers: Sit to/from Stand, Bed to chair/wheelchair/BSC ?Sit to Stand: Min assist ?  ?Step pivot transfers: Min guard ?  ?  ?  ?General transfer comment: CGA for step pivot w/ RW to recliner ?  ? ?Ambulation/Gait ?  ?  ?  ?  ?  ?  ?  ?  ? ? ?Stairs ?  ?  ?  ?  ?  ? ? ?Wheelchair Mobility ?  ? ?Modified Rankin (Stroke Patients Only) ?  ? ? ?  ?Balance Overall balance assessment: Needs assistance ?Sitting-balance support: Feet supported, Bilateral upper extremity supported ?Sitting balance-Leahy Scale: Fair ?  ?  ?Standing balance support: Bilateral upper extremity supported, During functional activity, Reliant on assistive device for balance ?Standing balance-Leahy Scale: Fair ?Standing balance comment: Able to maintain static standing balance with CGA ?  ?  ?  ?  ?  ?  ?  ?  ?  ?  ?  ?  ? ?  ?Cognition Arousal/Alertness: Awake/alert ?Behavior During Therapy: [facility] for tasks assessed/performed ?Overall Cognitive Status: Difficult to assess ?  ?  ?  ?  ?  ?  ?  ?  ?  ?  ?  ?  ?  ?  ?  ?  ?  General Comments: Requires increased processing time. Difficult to fully assess d/t slurred speech ?  ?  ? ?  ?Exercises General Exercises - Lower Extremity ?Long Arc Quad: AROM, Both, 10 reps ?Straight Leg Raises: AROM, Both, 10 reps ?Hip Flexion/Marching: AROM, Both, 10 reps ? ?  ?General Comments   ?  ?   ? ?Pertinent Vitals/Pain Pain Assessment ?Pain Assessment: Faces ?Faces Pain Scale: Hurts little more ?Pain Location: R hip ?Pain Descriptors / Indicators: Aching, Guarding, Sore ?Pain Intervention(s): Limited activity within patient's tolerance, Monitored during session  ? ? ?Home Living   ?  ?  ?  ?  ?  ?  ?  ?  ?  ?   ?  ?Prior Function    ?  ?  ?   ? ?PT Goals (current goals can now be found in the care plan section) Progress towards PT goals: Progressing toward goals ? ?  ?Frequency ? ? ? Min 2X/week ? ? ? ?  ?PT Plan Current plan remains appropriate  ? ? ?Co-evaluation   ?  ?  ?  ?  ? ?  ?AM-PAC PT "6 Clicks" Mobility   ?Outcome Measure ? Help needed turning from your back to your side while in a flat bed without using bedrails?: A Little ?Help needed moving from lying on your back to sitting on the side of a flat bed without using bedrails?: A Little ?Help needed moving to and from a bed to a chair (including a wheelchair)?: A Little ?Help needed standing up from a chair using your arms (e.g., wheelchair or bedside chair)?: A Little ?Help needed to walk in hospital room?: A Lot ?Help needed climbing 3-5 steps with a railing? : A Lot ?6 Click Score: 16 ? ?  ?End of Session Equipment Utilized During Treatment: Gait belt ?Activity Tolerance: Patient tolerated treatment well;Patient limited by fatigue ?Patient left: in chair;with chair alarm set;with call bell/phone within reach;with family/visitor present ?Nurse Communication: Mobility status ?PT Visit Diagnosis: Unsteadiness on feet (R26.81);History of falling (Z91.81);Other abnormalities of gait and mobility (R26.89);Muscle weakness (generalized) (M62.81);Pain ?Pain - Right/Left: Right ?Pain - part of body: Hip ?  ? ? ?Time: 4098-1191 ?PT Time Calculation (min) (ACUTE ONLY): 15 min ? ?Charges:             ?          ? ? ?Jeralyn Bennett, SPT ?09/07/2021, 11:20 AM ? ?

## 2021-09-07 NOTE — Progress Notes (Signed)
Occupational Therapy Treatment ?Patient Details ?Name: Brittney Kennedy ?MRN: [id number] ?DOB: 12/01/1950 ?Today's Date: 09/07/2021 ? ? ?History of present illness Pt is a 71 y.o. female who presents to ED w/ complaints of weakness and AMS. Admitted for metabolic acidosis, AKI, UTI, and acute metabolic encephalopathy. PmHx: CAD, T2DM, HTN, HFrEF, Left-sided weakness from prior stroke 3/21. ?  ?OT comments ? Chart reviewed, pt greeted in bed agreeable to OT tx session. Tx session targeted progressing functional strength and endurance through ADL tasks/ therapeutic activity. Pt demonstrated improvements in transfers, STS from bed>chair on this date. Pt educated on importance of mobility, participation in therapeutic tasks. Pt is left in bedside chair, NAD, all needs met. OT will continue to follow acutely.   ? ?Recommendations for follow up therapy are one component of a multi-disciplinary discharge planning process, led by the attending physician.  Recommendations may be updated based on patient status, additional functional criteria and insurance authorization. ?   ?Follow Up Recommendations ? Skilled nursing-short term rehab (<3 hours/day)  ?  ?Assistance Recommended at Discharge Intermittent Supervision/Assistance  ?Patient can return home with the following ? A lot of help with walking and/or transfers;A lot of help with bathing/dressing/bathroom;Assistance with cooking/housework ?  ?Equipment Recommendations ? None recommended by OT  ?  ?Recommendations for Other Services   ? ?  ?Precautions / Restrictions Precautions ?Precautions: Fall ?Restrictions ?Weight Bearing Restrictions: No  ? ? ?  ? ?Mobility Bed Mobility ?Overal bed mobility: Needs Assistance ?Bed Mobility: Supine to Sit ?  ?  ?Supine to sit: Min assist ?  ?  ?  ?  ? ?Transfers ?Overall transfer level: Needs assistance ?Equipment used: Rolling walker (2 wheels) ?Transfers: Sit to/from Stand ?Sit to Stand: Min guard, From elevated surface (from bed) ?  ?  ?   ?  ?  ?  ?  ?  ?Balance Overall balance assessment: Needs assistance ?Sitting-balance support: Feet supported, Bilateral upper extremity supported ?Sitting balance-Leahy Scale: Fair ?  ?  ?Standing balance support: Bilateral upper extremity supported, During functional activity, Reliant on assistive device for balance ?Standing balance-Leahy Scale: Fair ?  ?  ?  ?  ?  ?  ?  ?  ?  ?  ?  ?  ?   ? ?ADL either performed or assessed with clinical judgement  ? ?ADL Overall ADL's : Needs assistance/impaired ?  ?  ?Grooming: Wash/dry hands;Wash/dry face;Sitting;Set up ?  ?  ?  ?  ?  ?Upper Body Dressing : Minimal assistance;Sitting ?  ?Lower Body Dressing: Sitting/lateral leans;Moderate assistance ?Lower Body Dressing Details (indicate cue type and reason): socks at edge of bed ?Toilet Transfer: Min guard;BSC/3in1;Stand-pivot ?Toilet Transfer Details (indicate cue type and reason): simulated, to bedside chair ?  ?  ?  ?  ?Functional mobility during ADLs: Minimal assistance;Rolling walker (2 wheels) ?General ADL Comments: attempted STS from bedside chair 7x with MIN-MOD A, MIN A 4 attempts, MOD A 3 attempts with fatigue ?  ? ?Extremity/Trunk Assessment   ?  ?  ?  ?  ?  ? ?Vision   ?  ?  ?Perception   ?  ?Praxis   ?  ? ?Cognition Arousal/Alertness: Awake/alert ?Behavior During Therapy: WFL for tasks assessed/performed, Flat affect ?  ?  ?  ?  ?  ?  ?  ?  ?  ?  ?  ?  ?  ?  ?  ?  ?  ?General Comments: flat, appears alert and oriented. Fair self awareness. ?  ?  ?   ?  Exercises   ? ?  ?Shoulder Instructions   ? ? ?  ?General Comments    ? ? ?Pertinent Vitals/ Pain       Pain Assessment ?Pain Score: 7  ?Pain Location: R hip ?Pain Descriptors / Indicators: Aching, Sore ?Pain Intervention(s): Limited activity within patient's tolerance, Monitored during session, Repositioned ? ?Home Living   ?  ?  ?  ?  ?  ?  ?  ?  ?  ?  ?  ?  ?  ?  ?  ?  ?  ?  ? ?  ?Prior Functioning/Environment    ?  ?  ?  ?   ? ?Frequency ?    ? ? ? ? ?   ?Progress Toward Goals ? ?OT Goals(current goals can now be found in the care plan section) ? Progress towards OT goals: Progressing toward goals ? ?Acute Rehab OT Goals ?Patient Stated Goal: get stronger ?OT Goal Formulation: With patient/family ?Time For Goal Achievement: 09/21/21 ?Potential to Achieve Goals: Good  ?Plan Discharge plan remains appropriate   ? ?Co-evaluation ? ? ?   ?  ?  ?  ?  ? ?  ?AM-PAC OT "6 Clicks" Daily Activity     ?Outcome Measure ? ? Help from another person eating meals?: None ?Help from another person taking care of personal grooming?: None ?Help from another person toileting, which includes using toliet, bedpan, or urinal?: A Lot ?Help from another person bathing (including washing, rinsing, drying)?: A Lot ?Help from another person to put on and taking off regular upper body clothing?: A Little ?Help from another person to put on and taking off regular lower body clothing?: A Lot ?6 Click Score: 17 ? ?  ?End of Session Equipment Utilized During Treatment: Rolling walker (2 wheels) ? ?OT Visit Diagnosis: Unsteadiness on feet (R26.81);History of falling (Z91.81);Muscle weakness (generalized) (M62.81);Pain ?  ?Activity Tolerance Patient tolerated treatment well ?  ?Patient Left in chair;with call bell/phone within reach;with chair alarm set;with family/visitor present ?  ?Nurse Communication Mobility status ?  ? ?   ? ?Time: 2542-7062 ?OT Time Calculation (min): 35 min ? ?Charges: OT General Charges ?$OT Visit: 1 Visit ?OT Treatments ?$Self Care/Home Management : 8-22 mins ?$Therapeutic Activity: 8-22 mins ? ?Shanon Payor, OTD OTR/L  ?09/07/21, 3:20 PM  ?

## 2021-09-07 NOTE — Plan of Care (Signed)
  Problem: Health Behavior/Discharge Planning: Goal: Ability to manage health-related needs will improve Outcome: Progressing   Problem: Clinical Measurements: Goal: Ability to maintain clinical measurements within normal limits will improve Outcome: Progressing   

## 2021-09-07 NOTE — Progress Notes (Signed)
Order received from Dr Allena Katz to change CBG's to [facility] and HS and an order for eye drops per the patients request ?

## 2021-09-08 DIAGNOSIS — I10 Essential (primary) hypertension: Secondary | ICD-10-CM

## 2021-09-08 LAB — PHOSPHORUS: Phosphorus: 3.2 mg/dL (ref 2.5–4.6)

## 2021-09-08 LAB — GLUCOSE, CAPILLARY
Glucose-Capillary: 140 mg/dL — ABNORMAL HIGH (ref 70–99)
Glucose-Capillary: 183 mg/dL — ABNORMAL HIGH (ref 70–99)
Glucose-Capillary: 143 mg/dL — ABNORMAL HIGH (ref 70–99)

## 2021-09-08 LAB — RESP PANEL BY RT-PCR (FLU A&B, COVID) ARPGX2
Influenza A by PCR: NEGATIVE
Influenza B by PCR: NEGATIVE
SARS Coronavirus 2 by RT PCR: NEGATIVE

## 2021-09-08 LAB — MAGNESIUM: Magnesium: 1.6 mg/dL — ABNORMAL LOW (ref 1.7–2.4)

## 2021-09-08 MED ORDER — LOSARTAN POTASSIUM 25 MG PO TABS: 25.0000 mg | ORAL_TABLET | Freq: Every day | ORAL | 2 refills | Status: AC

## 2021-09-08 MED ORDER — TRAMADOL HCL 50 MG PO TABS
50.0000 mg | ORAL_TABLET | Freq: Three times a day (TID) | ORAL | 0 refills | Status: DC | PRN
Start: 2021-09-08 — End: 2022-01-25

## 2021-09-08 MED ORDER — AMOXICILLIN-POT CLAVULANATE 875-125 MG PO TABS: 1.0000 | ORAL_TABLET | Freq: Two times a day (BID) | ORAL | 0 refills | Status: AC

## 2021-09-08 MED ORDER — METFORMIN HCL 500 MG PO TABS: 500.0000 mg | ORAL_TABLET | Freq: Two times a day (BID) | ORAL | 0 refills | Status: AC

## 2021-09-08 NOTE — Progress Notes (Signed)
Attempted to call report to [facility]  (4 times)  @ [phone] with no answer just voicemail.  EMS in route will continue to call facility and leave  my direct number with EMS to provide receiving nurse for questions and updates. ?

## 2021-09-08 NOTE — Care Management Important Message (Signed)
Important Message ? ?Patient Details  ?Name: Brittney Kennedy ?MRN: [id number] ?Date of Birth: [birth year] ? ? ?Medicare Important Message Given:  Yes ? ? ? ? ?Johnell Comings ?09/08/2021, 11:22 AM ?

## 2021-09-08 NOTE — TOC Transition Note (Signed)
Transition of Care (TOC) - CM/SW Discharge Note ? ? ?Patient Details  ?Name: Brittney Kennedy ?MRN: [id number] ?Date of Birth: 1951/06/23 ? ?Transition of Care (TOC) CM/SW Contact:  ?Chapman Fitch, RN ?Phone Number: ?09/08/2021, 3:06 PM ? ? ?Clinical Narrative:    ?Patient will DC to: [facility] ?Anticipated DC date:09/08/21 ? ?Family notified:[name] ?Transport [id number] ? ?Per MD patient ready for DC to . RN, patient, patient's family, and facility notified of DC. Discharge Summary sent to facility. RN given number for report. DC packet on chart. Ambulance transport requested for patient.  ?TOC signing off. ? ?Bevelyn Ngo RNCM ?[phone] ? ? ? ?  ?Barriers to Discharge: Continued Medical Work up ? ? ?Patient Goals and CMS Choice ?  ?  ?  ? ?Discharge Placement ?  ?           ?  ?  ?  ?  ? ?Discharge Plan and Services ?  ?Discharge Planning Services: CM Consult ?           ?  ?  ?  ?  ?  ?  ?  ?  ?  ?  ? ?Social Determinants of Health (SDOH) Interventions ?  ? ? ?Readmission Risk Interventions ?No flowsheet data found. ? ? ? ? ?

## 2021-09-08 NOTE — TOC Progression Note (Signed)
Transition of Care (TOC) - Progression Note  ? ? ?Patient Details  ?Name: Brittney Kennedy ?MRN: [id number] ?Date of Birth: Mar 14, 1951 ? ?Transition of Care (TOC) CM/SW Contact  ?Beverly Sessions, RN ?Phone Number: ?09/08/2021, 10:47 AM ? ?Clinical Narrative:    ? ?Insurance auth for SNF pending  ?Will need repeat covid test within 48 hours of discharge  ? ?Expected Discharge Plan: [place] ?Barriers to Discharge: Continued Medical Work up ? ?Expected Discharge Plan and Services ?Expected Discharge Plan: [place] ?  ?Discharge Planning Services: CM Consult ?  ?Living arrangements for the past 2 months: [place] ?                ?  ?  ?  ?  ?  ?  ?  ?  ?  ?  ? ? ?Social Determinants of Health (SDOH) Interventions ?  ? ?Readmission Risk Interventions ?No flowsheet data found. ? ?

## 2021-09-08 NOTE — Discharge Summary (Signed)
Physician Discharge Summary   Patient: Brittney Kennedy MRN: [id number] DOB: 1951/05/22  Admit date:     09/04/2021  Discharge date: 09/08/21  Discharge Physician: Fritzi Mandes   PCP: Kirk Ruths, MD   Recommendations at discharge:    follow-up Dr. Ouida Sills in 1 to 2 week  Discharge Diagnoses: E. coli UTI acute kidney injury with acute metabolic acidosis improving acute metabolic encephalopathy resolved    Hospital Course: Brittney Kennedy is a 71 yo female with PMH CAD, DMII, HFrEF, HTN, PUD, CVA who presented to the ER with weakness and altered mental status.  She was reported to have some slurring of speech and confusion as well as some recent diarrhea.  No sick contacts were noted.  She has not been on any recent antibiotics. UA was suggestive of infection and she was started on Rocephin for presumed UTI.   E. coli UTI -- patient was on IV Rocephin now switch to oral Augmentin (total 7 days) -- afebrile -- denies any urinary symptoms -- urine culture growing E. Coli   acute kidney injury acute metabolic acidosis -- baseline creatinine 0.7 -- presents with creatinine of 1.1 on admission -- status post bicarb drip -- patient drinking adequate fluids. Will DC IV fluids   acute metabolic encephalopathy -- multifactorial --- mentation much improved and almost back to baseline percent   generalized weakness with physical deconditioning due to chronic DJD -- patient will be going to rehab   type II diabetes -- continue SSI and resume metformin/glipizide at discharge   Hypertension -- stable -- resume Coreg --resumed losartan at low dose 25 mg (was on 100 mg ) with holding parameters   overall improving remain stable  D/c to rehab today D/w son at bedside       Consultants: none Procedures performed: none  Disposition: Skilled nursing facility Diet recommendation:  Discharge Diet Orders (From admission, onward)     Start     Ordered   09/08/21 0000  Diet -  low sodium heart healthy        09/08/21 1326           Cardiac and Carb modified diet DISCHARGE MEDICATION: Allergies as of 09/08/2021       Reactions   Onion Anaphylaxis, Hives   (Raw onions)   Monosodium Glutamate Nausea Only   Headache Fatigue        Medication List     TAKE these medications    acetaminophen 325 MG tablet Commonly known as: TYLENOL Take 2 tablets (650 mg total) by mouth every 4 (four) hours as needed for mild pain or headache.   amoxicillin-clavulanate 875-125 MG tablet Commonly known as: AUGMENTIN Take 1 tablet by mouth every 12 (twelve) hours for 4 days.   atorvastatin 80 MG tablet Commonly known as: LIPITOR Take 1 tablet (80 mg total) by mouth daily at 6 PM.   Calcium-Vitamin D-Minerals 600-800 MG-UNIT Chew Chew by mouth.   carvedilol 25 MG tablet Commonly known as: COREG Take 1 tablet (25 mg total) by mouth 2 (two) times daily.   clopidogrel 75 MG tablet Commonly known as: PLAVIX Take 1 tablet (75 mg total) by mouth daily with breakfast.   diclofenac Sodium 1 % Gel Commonly known as: VOLTAREN Apply 2 g topically daily as needed.   docusate sodium 100 MG capsule Commonly known as: COLACE Take 100 mg by mouth 2 (two) times daily.   glipiZIDE 5 MG tablet Commonly known as: GLUCOTROL Take by mouth.   hydrocortisone  cream 1 % Apply 1 application topically 2 (two) times daily as needed for itching.   losartan 25 MG tablet Commonly known as: Cozaar Take 1 tablet (25 mg total) by mouth daily. HOLD if SBP <120 What changed:  medication strength how much to take additional instructions   magnesium gluconate 500 MG tablet Commonly known as: MAGONATE Take 500 mg by mouth every 8 (eight) hours.   Melatonin 10 MG Caps Take 20 mg by mouth daily as needed.   metFORMIN 500 MG tablet Commonly known as: GLUCOPHAGE Take 1 tablet (500 mg total) by mouth 2 (two) times daily with a meal.   multivitamin with minerals Tabs  tablet Take 1 tablet by mouth daily.   nitroGLYCERIN 0.4 MG SL tablet Commonly known as: NITROSTAT Place 1 tablet (0.4 mg total) under the tongue every 5 (five) minutes as needed for chest pain.   oxybutynin 10 MG 24 hr tablet Commonly known as: DITROPAN-XL Take 1 tablet (10 mg total) by mouth daily.   pantoprazole 40 MG tablet Commonly known as: PROTONIX Take 1 tablet (40 mg total) by mouth daily before breakfast.   traMADol 50 MG tablet Commonly known as: ULTRAM Take 1 tablet (50 mg total) by mouth every 8 (eight) hours as needed for moderate pain or severe pain.        Contact information for follow-up providers     Kirk Ruths, MD. Schedule an appointment as soon as possible for a visit in 1 week(s).   Specialty: Internal Medicine Why: hosp f/u Contact information: [place] [address] [phone]         Minna Merritts, MD .   Specialty: Cardiology Contact information: [place] [address] [phone]              Contact information for after-discharge care     Destination     [facility] .   Service: Skilled Nursing Contact information: [address] [place] [phone]                    Discharge Exam: Danley Danker Weights   09/04/21 1847 09/05/21 1742  Weight: 72.6 kg 70.4 kg     Condition at discharge: fair  The results of significant diagnostics from this hospitalization (including imaging, microbiology, ancillary and laboratory) are listed below for reference.   Imaging Studies: CT HEAD WO CONTRAST (5MM)  Result Date: 09/04/2021 CLINICAL DATA:  Slurred speech and weakness. EXAM: CT HEAD WITHOUT CONTRAST TECHNIQUE: Contiguous axial images were obtained from the base of the skull through the vertex without intravenous contrast. RADIATION DOSE REDUCTION: This exam was performed  according to the departmental dose-optimization program which includes automated exposure control, adjustment of the mA and/or kV according to patient size and/or use of iterative reconstruction technique. COMPARISON:  October 26, 2019 FINDINGS: Brain: There is mild cerebral atrophy with widening of the extra-axial spaces and ventricular dilatation. There are areas of decreased attenuation within the white matter tracts of the supratentorial brain, consistent with microvascular disease changes. A chronic right parietooccipital infarct is seen. Vascular: No hyperdense vessel or unexpected calcification. Skull: Normal. Negative for fracture or focal lesion. Sinuses/Orbits: No acute finding. Other: None. IMPRESSION: 1. No acute intracranial abnormality. 2. Chronic right parietooccipital infarct. Electronically Signed   By: Virgina Norfolk M.D.   On: 09/04/2021 19:41   MR LUMBAR SPINE WO CONTRAST  Result Date: 08/27/2021 CLINICAL DATA:  Low back pain, some right hip pain EXAM: MRI LUMBAR SPINE WITHOUT CONTRAST TECHNIQUE: Multiplanar, multisequence MR imaging of the lumbar spine was performed. No intravenous contrast was administered. COMPARISON:  None. FINDINGS: Segmentation: The last rib-bearing vertebral body is presumed to be T12. There are 5 lumbar-type vertebral bodies with lumbarization of S1, with a disc space at S1-S2. Alignment: Levocurvature. Grade 1 anterolisthesis L5 on S1, with possible left pars defect. Vertebrae:  No acute fracture or suspicious osseous lesion. Conus medullaris and cauda equina: Conus extends to the L1-L2 level. Conus and cauda equina appear normal. Paraspinal and other soft tissues: Renal cysts. Fatty atrophy of the inferior paraspinous muscles. No lymphadenopathy. Disc levels: T12-L1: No significant disc bulge. No spinal canal stenosis or neural foraminal narrowing. L1-L2: No significant disc bulge. No spinal canal stenosis or neural foraminal narrowing. L2-L3: No significant disc  bulge. No spinal canal stenosis or neural foraminal narrowing. L3-L4: Disc height loss and broad-based, somewhat right eccentric disc bulge with superimposed central protrusion. Mild spinal canal stenosis. No neural foraminal narrowing; however the exiting L3 nerves likely contact the lateral aspects of the disc bulge. L4-L5: Mild disc bulge. Mild facet arthropathy. No spinal canal stenosis. Mild bilateral neural foraminal narrowing. L5-S1: Grade 1 anterolisthesis with disc unroofing and right eccentric disc bulge. Severe facet arthropathy. No spinal canal stenosis. Narrowing of the lateral recesses. No neural foraminal narrowing; however, the exiting right L5 nerve is contacted and displaced by the disc bulge. The disc bulge may also contact the exiting left L5 nerve. IMPRESSION: 1. L3-L4 mild spinal canal stenosis. The exiting L3 nerves may contact the lateral aspect of the disc bulge at this level. 2. L4-L5 mild bilateral neural foraminal narrowing. 3. The exiting right L5 nerve is laterally displaced by the asymmetric disc bulge; the disc bulge may also contact the exiting left L5 nerve. In addition, narrowing of the lateral recesses at this level could affect the descending S1 nerve roots. 4. Transitional anatomy. Please correlate with imaging if any intervention is planned. Electronically Signed   By: Merilyn Baba M.D.   On: 08/27/2021 02:55    Microbiology: Results for orders placed or performed during the hospital encounter of 09/04/21  Urine Culture     Status: Abnormal   Collection Time: 09/05/21  1:38 AM   Specimen: Urine, Clean Catch  Result Value Ref Range Status   Specimen Description   Final    URINE, CLEAN CATCH Performed at [facility], [address] [place], [place] [address]    Special Requests   Final    NONE Performed at [facility], [address] [place], [place] [address]    Culture >=100,000 COLONIES/mL ESCHERICHIA COLI (A)  Final   Report Status  09/07/2021 FINAL  Final   Organism ID, Bacteria ESCHERICHIA COLI (A)  Final      Susceptibility   Escherichia coli - MIC*    AMPICILLIN 8 SENSITIVE Sensitive     CEFAZOLIN <=4 SENSITIVE Sensitive     CEFEPIME <=0.12 SENSITIVE Sensitive     CEFTRIAXONE <=0.25 SENSITIVE Sensitive     CIPROFLOXACIN <=0.25 SENSITIVE Sensitive     GENTAMICIN <=1 SENSITIVE Sensitive     IMIPENEM <=0.25 SENSITIVE Sensitive     NITROFURANTOIN <=16 SENSITIVE Sensitive     TRIMETH/SULFA <=20 SENSITIVE Sensitive     AMPICILLIN/SULBACTAM 4 SENSITIVE Sensitive     PIP/TAZO <=4 SENSITIVE Sensitive     * >=100,000 COLONIES/mL ESCHERICHIA COLI  Resp Panel by RT-PCR (Flu A&B, Covid) Nasopharyngeal Swab     Status: None   Collection Time: 09/05/21  1:47 AM   Specimen: Nasopharyngeal Swab; Nasopharyngeal(NP) swabs in vial transport medium  Result Value Ref Range Status   SARS Coronavirus 2 by RT PCR NEGATIVE NEGATIVE Final    Comment: (NOTE) SARS-CoV-2 target nucleic acids are NOT DETECTED.  The SARS-CoV-2 RNA is generally detectable in upper respiratory specimens during the acute phase of infection. The lowest concentration of SARS-CoV-2 viral copies this assay can detect is 138 copies/mL. A negative result does not preclude SARS-Cov-2 infection and should not be used as the sole basis for treatment or other patient management decisions. A negative result may occur with  improper specimen collection/handling, submission of specimen other than nasopharyngeal swab, presence of viral mutation(s) within the areas targeted by this assay, and inadequate number of viral copies(<138 copies/mL). A negative result must be combined with clinical observations, patient history, and epidemiological information. The expected result is Negative.  Fact Sheet for Patients:  EntrepreneurPulse.com.au  Fact Sheet for Healthcare Providers:  IncredibleEmployment.be  This test is no t yet approved  or cleared by the [place] FDA and  has been authorized for detection and/or diagnosis of SARS-CoV-2 by FDA under an Emergency Use Authorization (EUA). This EUA will remain  in effect (meaning this test can be used) for the duration of the COVID-19 declaration under Section 564(b)(1) of the Act, 21 U.S.C.section 360bbb-3(b)(1), unless the authorization is terminated  or revoked sooner.       Influenza A by PCR NEGATIVE NEGATIVE Final   Influenza B by PCR NEGATIVE NEGATIVE Final    Comment: (NOTE) The Xpert Xpress SARS-CoV-2/FLU/RSV plus assay is intended as an aid in the diagnosis of influenza from Nasopharyngeal swab specimens and should not be used as a sole basis for treatment. Nasal washings and aspirates are unacceptable for Xpert Xpress SARS-CoV-2/FLU/RSV testing.  Fact Sheet for Patients: EntrepreneurPulse.com.au  Fact Sheet for Healthcare Providers: IncredibleEmployment.be  This test is not yet approved or cleared by the [place] FDA and has been authorized for detection and/or diagnosis of SARS-CoV-2 by FDA under an Emergency Use Authorization (EUA). This EUA will remain in effect (meaning this test can be used) for the duration of the COVID-19 declaration under Section 564(b)(1) of the Act, 21 U.S.C. section 360bbb-3(b)(1), unless the authorization is terminated or revoked.  Performed at [facility], [address] [place], [place] [address]     Labs: CBC: Recent Labs  Lab 09/04/21 1849 09/06/21 0550 09/07/21 0445  WBC 13.5* 11.0* 8.7  NEUTROABS  --  6.6 5.2  HGB 13.3 12.1 11.0*  HCT 41.2 34.3* 32.2*  MCV 93.0 88.4 90.2  PLT 271 225 [id number]   Basic Metabolic Panel: Recent Labs  Lab 09/04/21 1849 09/05/21 0059 09/05/21 1445 09/06/21 0550 09/07/21 0445 09/08/21 0526  NA 140 143 145 141 138  --   K 3.5 3.4* 3.0* 3.1* 4.4  --   CL 116* 121* 115* 109 107  --   CO2 8* 8* 12* 22 23  --   GLUCOSE  121* 111* 122* 110* 112*  --   BUN 13 12 10  7* 10  --   CREATININE 1.10* 1.06* 0.81 0.70 0.70  --   CALCIUM 10.3 9.7 9.0 8.4* 8.3*  --   MG  --  1.5*  --  1.2* 2.2 1.6*  PHOS  --   --   --   --  1.4*  3.2   Liver Function Tests: Recent Labs  Lab 09/04/21 1849  AST 39  ALT 18  ALKPHOS 46  BILITOT 0.7  PROT 6.4*  ALBUMIN 3.4*   CBG: Recent Labs  Lab 09/07/21 1146 09/07/21 1647 09/07/21 2037 09/08/21 0813 09/08/21 1134  GLUCAP 254* 121* 162* 140* 183*    Discharge time spent: greater than 30 minutes.  Signed: Fritzi Mandes, MD [facility] 09/08/2021

## 2021-10-01 ENCOUNTER — Other Ambulatory Visit: Payer: Self-pay | Admitting: Internal Medicine

## 2021-10-01 DIAGNOSIS — N289 Disorder of kidney and ureter, unspecified: Secondary | ICD-10-CM

## 2021-10-11 ENCOUNTER — Ambulatory Visit: Payer: Medicare HMO | Admitting: Cardiovascular Disease

## 2021-10-19 ENCOUNTER — Ambulatory Visit
Admission: RE | Admit: 2021-10-19 | Discharge: 2021-10-19 | Disposition: A | Discharge status: Home / Self Care | Payer: Medicare HMO | Source: Ambulatory Visit | Attending: Internal Medicine | Admitting: Internal Medicine

## 2021-10-19 ENCOUNTER — Other Ambulatory Visit: Payer: Medicare HMO

## 2021-10-19 DIAGNOSIS — N289 Disorder of kidney and ureter, unspecified: Secondary | ICD-10-CM

## 2021-12-13 ENCOUNTER — Telehealth: Payer: Self-pay | Admitting: Cardiovascular Disease

## 2021-12-13 NOTE — Telephone Encounter (Signed)
3 attempts to schedule fu appt from recall list.   Deleting recall.   

## 2021-12-28 ENCOUNTER — Encounter: Payer: Self-pay | Admitting: Physician Assistant

## 2021-12-28 ENCOUNTER — Ambulatory Visit (INDEPENDENT_AMBULATORY_CARE_PROVIDER_SITE_OTHER): Payer: Medicare HMO | Admitting: Physician Assistant

## 2021-12-28 VITALS — BP 133/80 | HR 94 | Ht 62.0 in | Wt 150.0 lb

## 2021-12-28 DIAGNOSIS — R350 Frequency of micturition: Secondary | ICD-10-CM | POA: Diagnosis not present

## 2021-12-28 DIAGNOSIS — R8271 Bacteriuria: Secondary | ICD-10-CM

## 2021-12-28 LAB — MICROSCOPIC EXAMINATION
Epithelial Cells (non renal): NONE SEEN /hpf (ref 0–10)
WBC, UA: >30 /hpf — AB (ref 0–5)

## 2021-12-28 LAB — URINALYSIS, COMPLETE
Bilirubin, UA: NEGATIVE
Glucose, UA: NEGATIVE
Ketones, UA: NEGATIVE
Nitrite, UA: POSITIVE — AB
RBC, UA: NEGATIVE
Specific Gravity, UA: 1.02 (ref 1.005–1.030)
Urobilinogen, Ur: 0.2 mg/dL (ref 0.2–1.0)
pH, UA: 5.5 (ref 5.0–7.5)

## 2021-12-28 LAB — BLADDER SCAN AMB NON-IMAGING

## 2021-12-28 MED ORDER — MIRABEGRON ER 25 MG PO TB24: 25.0000 mg | ORAL_TABLET | Freq: Every day | ORAL | 0 refills | Status: DC

## 2021-12-31 LAB — CULTURE, URINE COMPREHENSIVE

## 2022-01-05 IMAGING — CT CT CHEST W/O CM
3 series · 11 of 36 positions shown, 15 images · non-contrast
Comparison: CT scan of the abdomen dated 09/26/2019

CLINICAL DATA: Thoracic aortic disease. Pre operative planning for
CABG.

EXAM:
CT CHEST, ABDOMEN AND PELVIS WITHOUT CONTRAST
TECHNIQUE: Multidetector CT imaging of the chest, abdomen and pelvis was
performed following the standard protocol without IV contrast.

[Series 5: lungs · axial · 0.73mm/px · z∈[+39,+283]mm · 8 of 144 slices shown]
[im 11/144  soft-tissue]
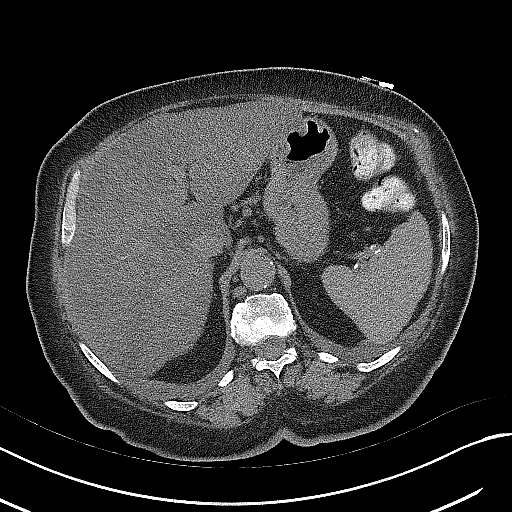
[im 27/144  soft-tissue]
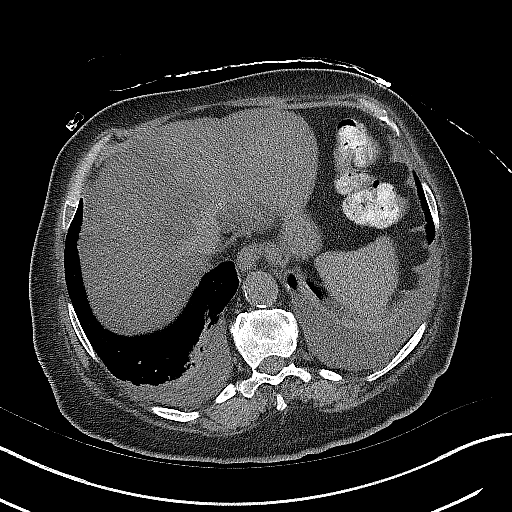
[im 48/144  soft-tissue]
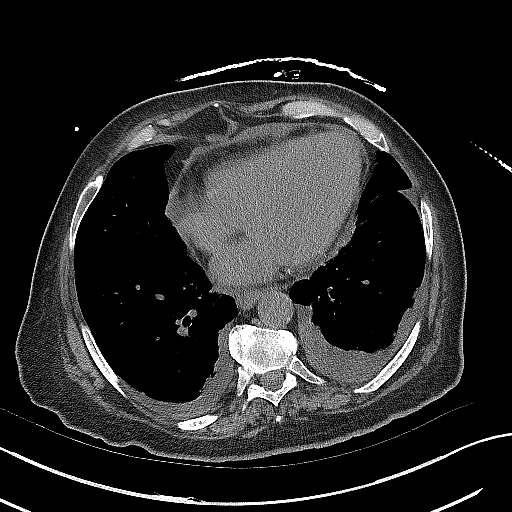
[im 64/144  soft-tissue]
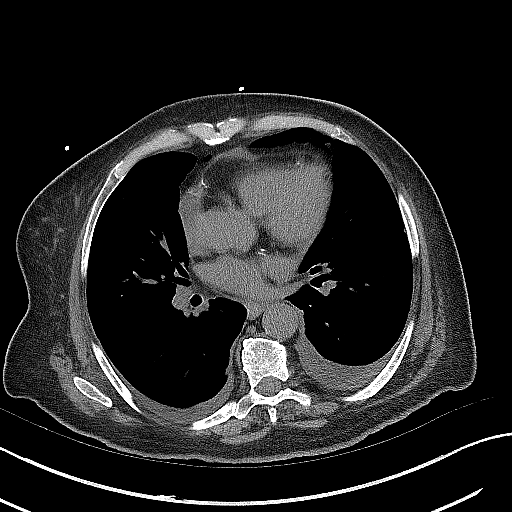
[im 80/144  soft-tissue]
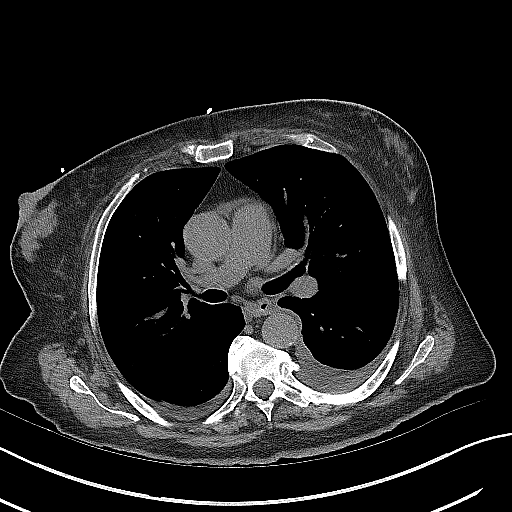
[im 96/144  soft-tissue]
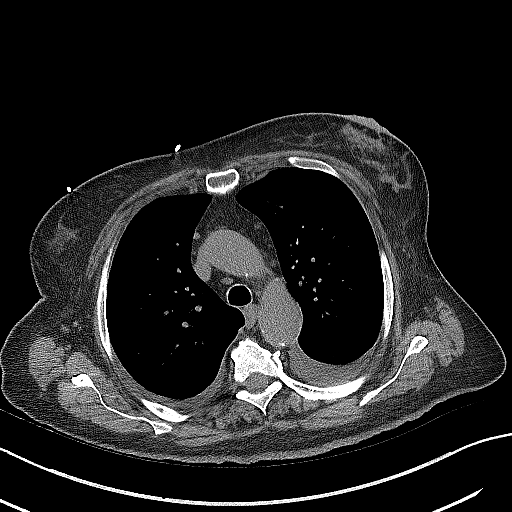
[im 117/144  soft-tissue]
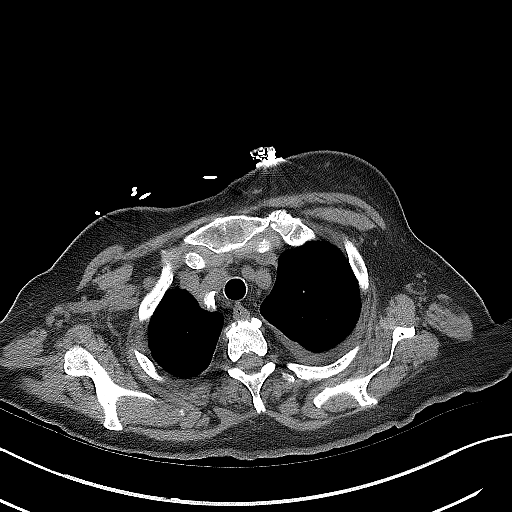
[im 133/144  soft-tissue]
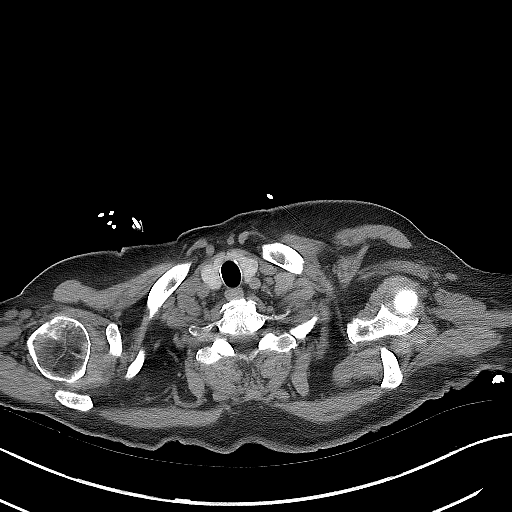

[Series 7: sag · sagittal · 0.64mm/px · 1 of 113 slices shown, 2 images]
[im 38/113  soft-tissue]
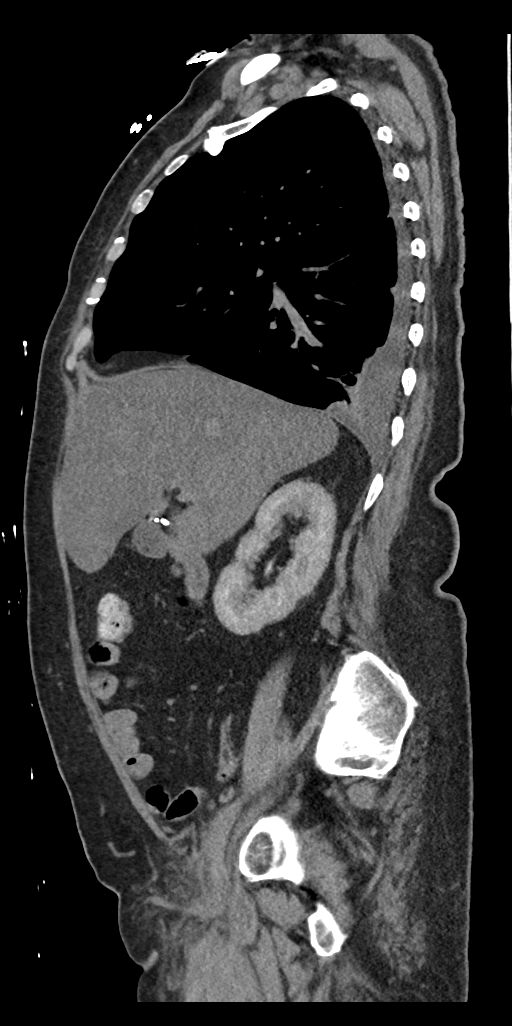
[im 38/113  bone]
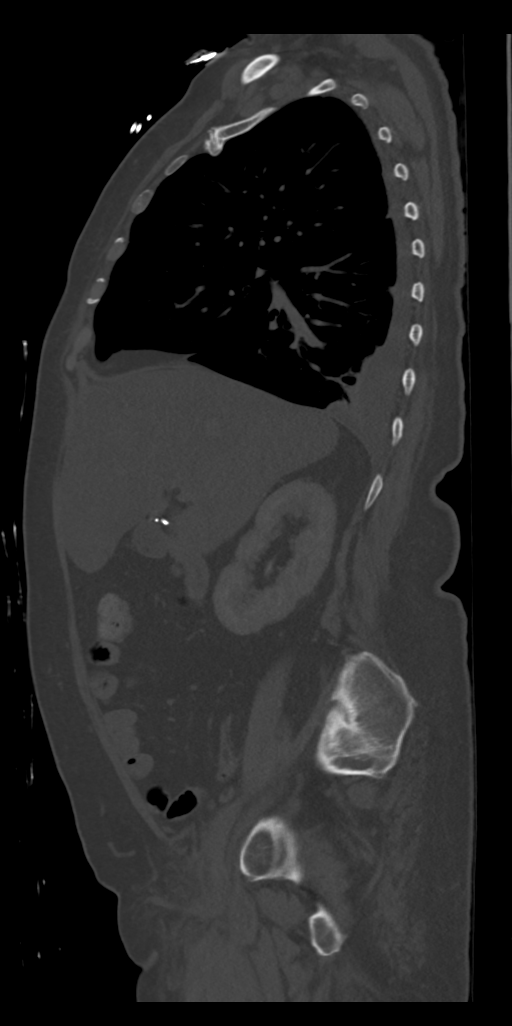

[Series 8: cap without · axial · non-contrast · 0.73mm/px · z∈[-363,-333]mm · 2 of 18 slices shown, 5 images]
[im 6/18  soft-tissue]
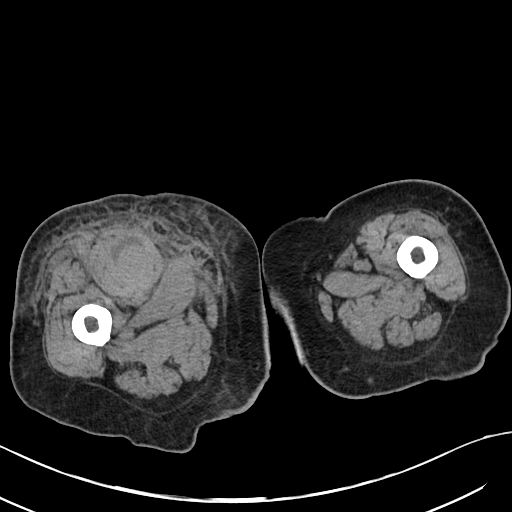
[im 6/18  lung]
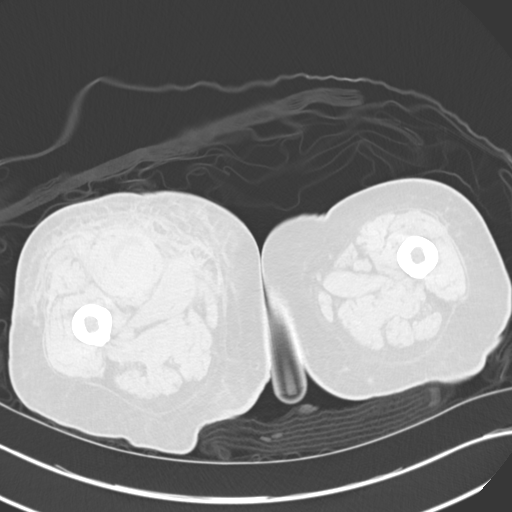
[im 6/18  bone]
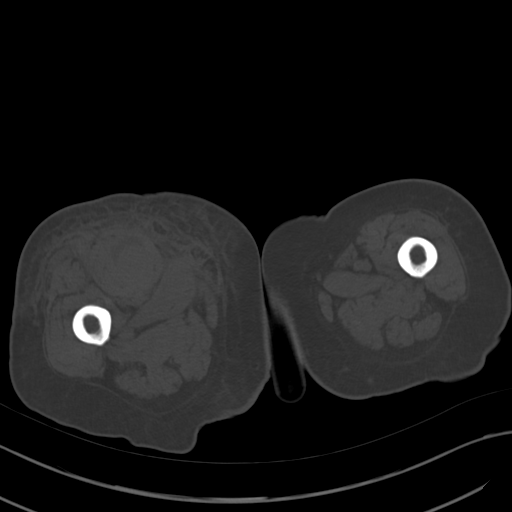
[im 12/18  soft-tissue]
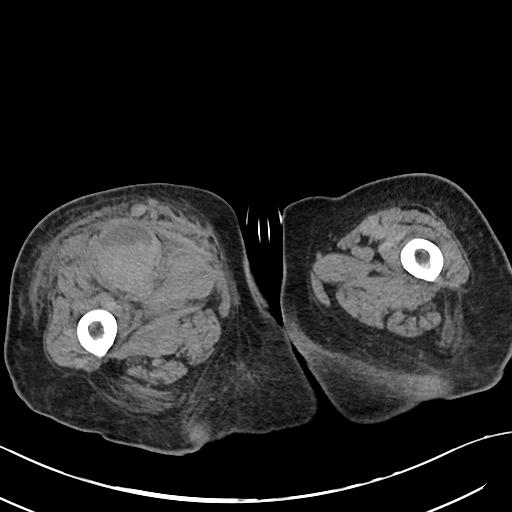
[im 12/18  lung]
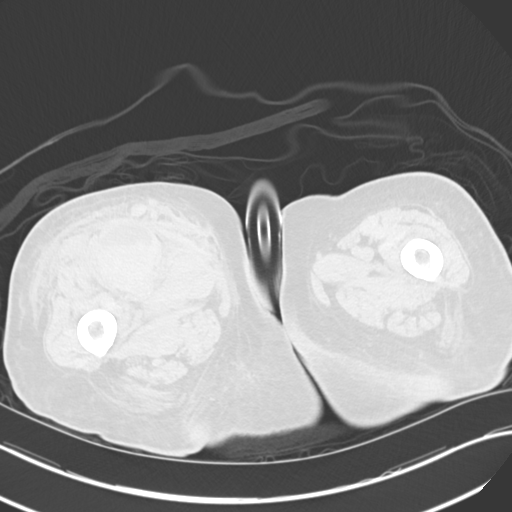

[11 of 36 positions shown; findings below may reference images not displayed]

FINDINGS: CT CHEST FINDINGS

Cardiovascular: Aortic atherosclerosis without dilatation of the
thoracic aorta. Coronary artery calcifications. Heart is at the
upper limits of normal in size. Small pericardial effusion.

Mediastinum/Nodes: 9 mm nodule in the right lobe of the thyroid
gland. Trachea is normal. Esophagus is normal.

Lungs/Pleura: Small bilateral pleural effusions minimal secondary
atelectasis at the bases. Lungs are otherwise clear.

Musculoskeletal: No chest wall mass or suspicious bone lesions
identified.

CT ABDOMEN PELVIS FINDINGS

Hepatobiliary: Slight hepatic steatosis. Cholecystectomy. No biliary
ductal dilatation.

Pancreas: Normal.

Spleen: Normal.

Adrenals/Urinary Tract: Adrenal glands are normal. There are
persistent nephrograms bilaterally without hydronephrosis. Tiny area
of linear enhancement in the medial aspect of the upper pole is not
felt to be significant.

Stomach/Bowel: Stomach is within normal limits. Appendix appears
normal. No evidence of bowel wall thickening, distention, or
inflammatory changes. There are a few diverticula in the left side
of the colon. No diverticulitis.

Vascular/Lymphatic: Aortic atherosclerosis. No enlarged abdominal or
pelvic lymph nodes.

Reproductive: Uterus and ovaries appear normal. Patient's history
reports abdominal hysterectomy but that does not appear to be
accurate.

Other: No abdominal wall hernia or abnormality. No abdominopelvic
ascites.

Musculoskeletal: There is an incompletely visualized 5 cm
intramuscular hematoma in the right rectus femoris muscle. There is
slight hemorrhage in the adjacent adductor longus muscle without a
defined hematoma.
IMPRESSION: 1. Aortic atherosclerosis.  No aneurysmal dilatation or dissection.
2. Incompletely visualized 5 cm intramuscular hematoma in the right
rectus femoris muscle. Slight hemorrhage in the adjacent adductor
longus muscle without a defined hematoma.
3. Persistent nephrograms bilaterally which could represent acute
tubular necrosis.
4. Small bilateral pleural effusions with minimal secondary
atelectasis at the bases.
5. Slight hepatic steatosis.
6. 9 mm nodule in the right lobe of the thyroid gland. No follow-up
recommended. This recommendation follows ACR consensus guidelines:
Managing Incidental Thyroid Nodules Detected on Imaging: White Paper
of [REDACTED]. [HOSPITAL]

Aortic Atherosclerosis ([id number]).

## 2022-01-06 IMAGING — CT CT HEAD W/O CM
2 series · 15 of 40 positions shown, 18 images · non-contrast
Comparison: None.

CLINICAL DATA: Altered mental status

EXAM:
CT HEAD WITHOUT CONTRAST
TECHNIQUE: Contiguous axial images were obtained from the base of the skull
through the vertex without intravenous contrast.

[Series 3: head wo · axial · 0.43mm/px · z∈[-190,-66]mm · 12 of 31 slices shown, 15 images]
[im 3/31  brain]
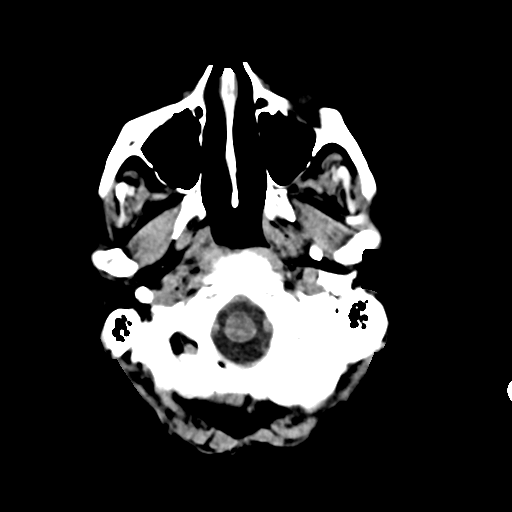
[im 3/31  bone]
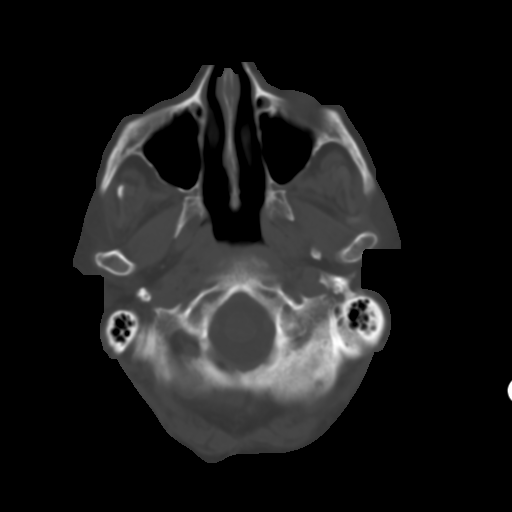
[im 5/31  brain]
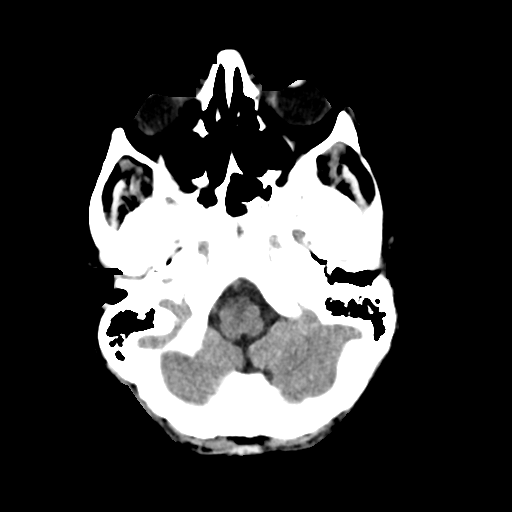
[im 7/31  brain]
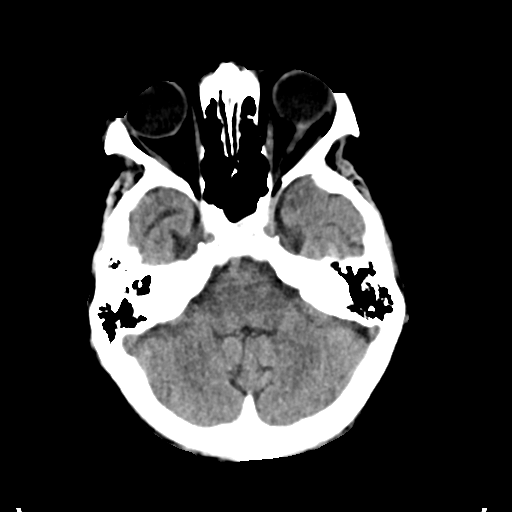
[im 10/31  brain]
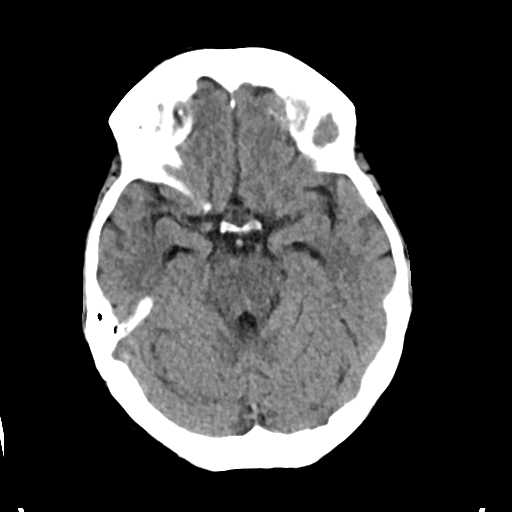
[im 12/31  brain]
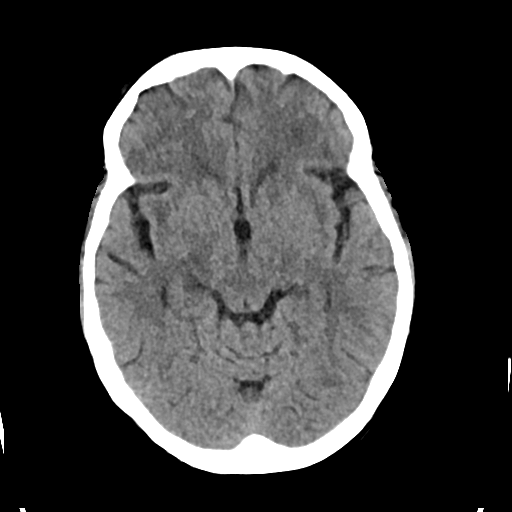
[im 12/31  bone]
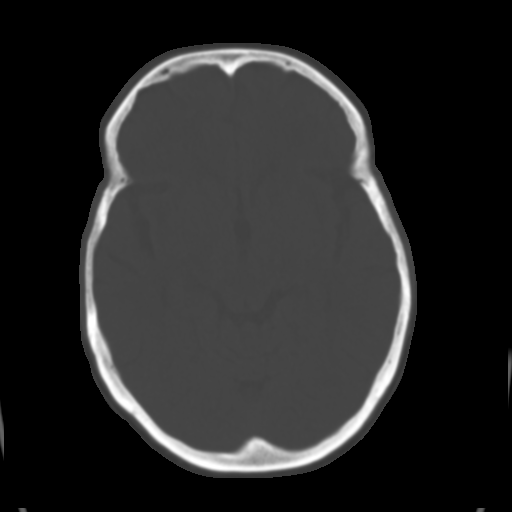
[im 14/31  brain]
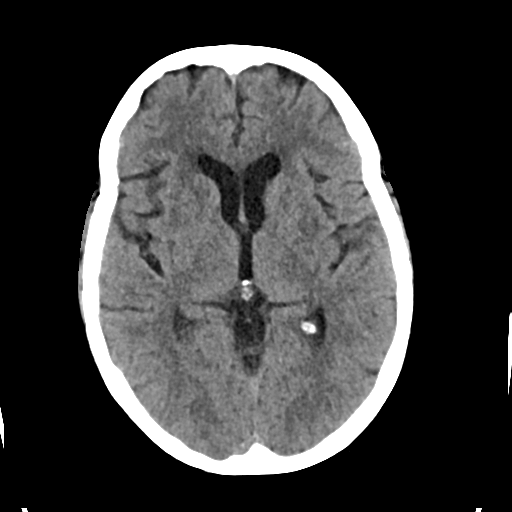
[im 17/31  brain]
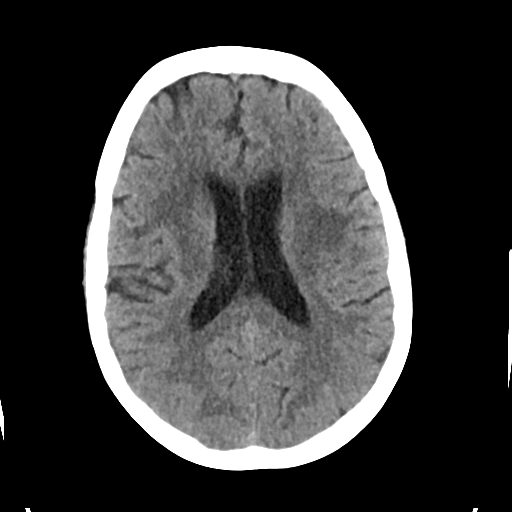
[im 19/31  brain]
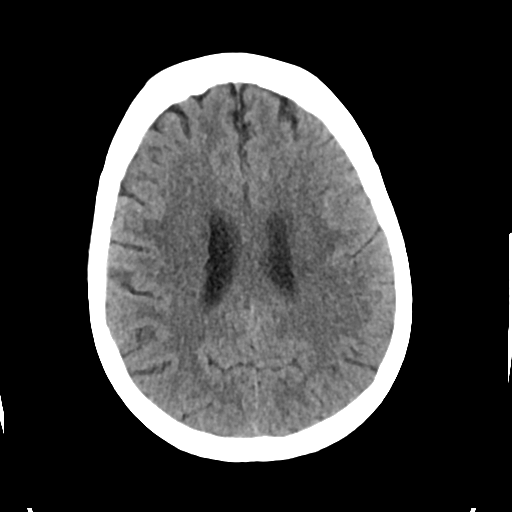
[im 21/31  brain]
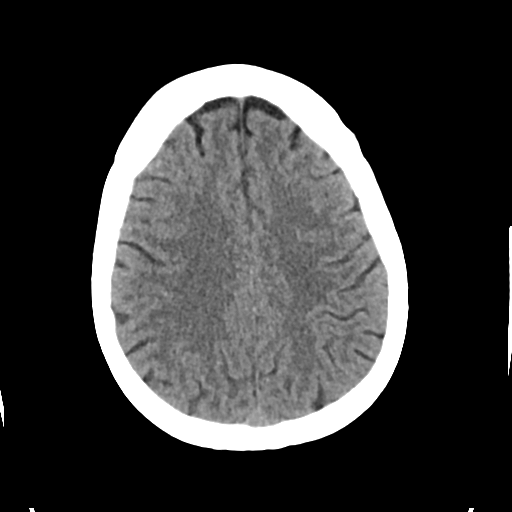
[im 21/31  bone]
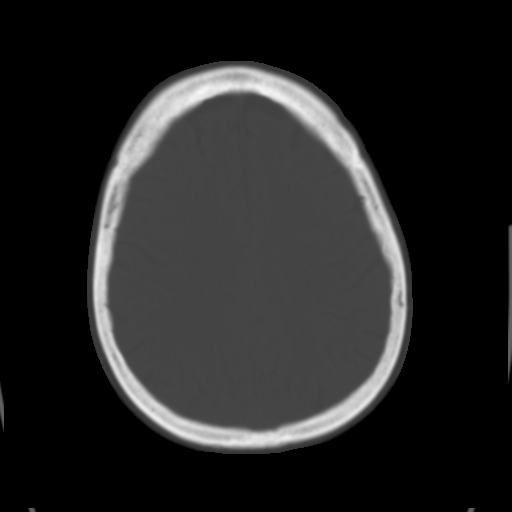
[im 24/31  brain]
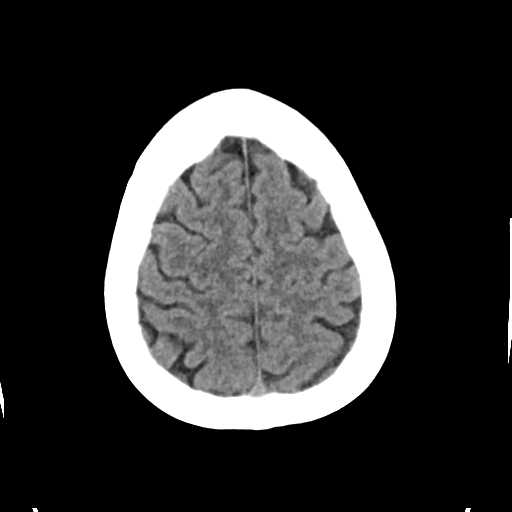
[im 26/31  brain]
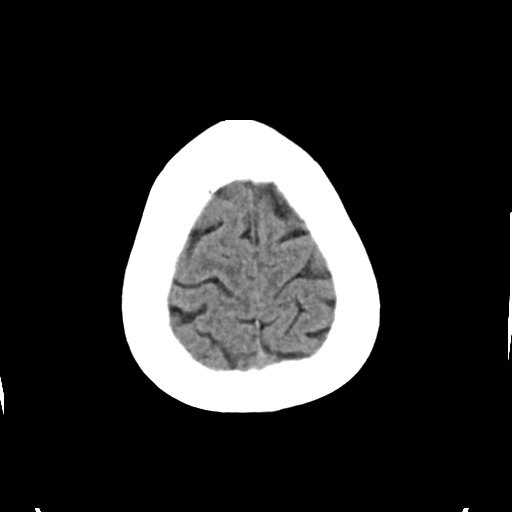
[im 28/31  brain]
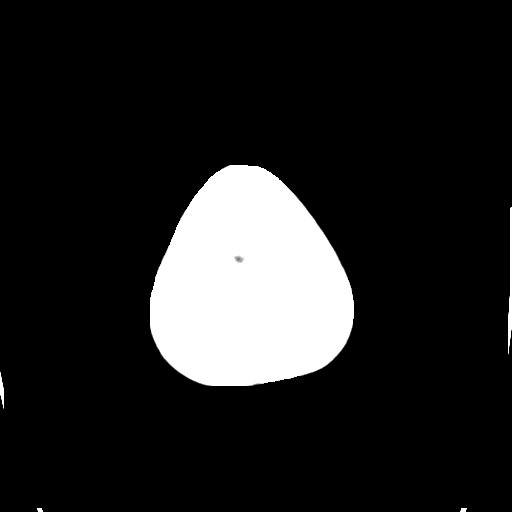

[Series 5: cor soft · coronal · 0.33mm/px · 3 of 63 slices shown]
[im 21/63  brain]
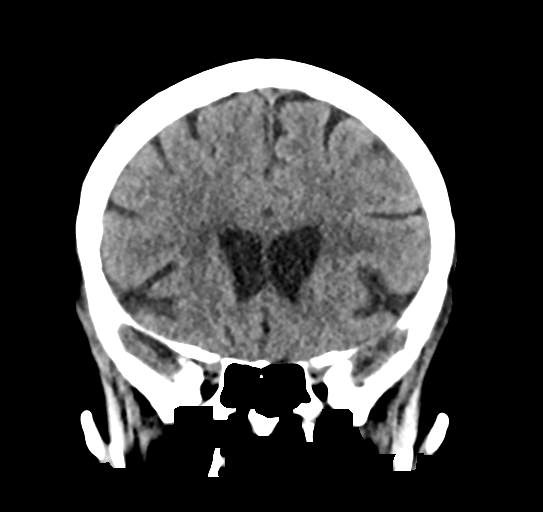
[im 28/63  brain]
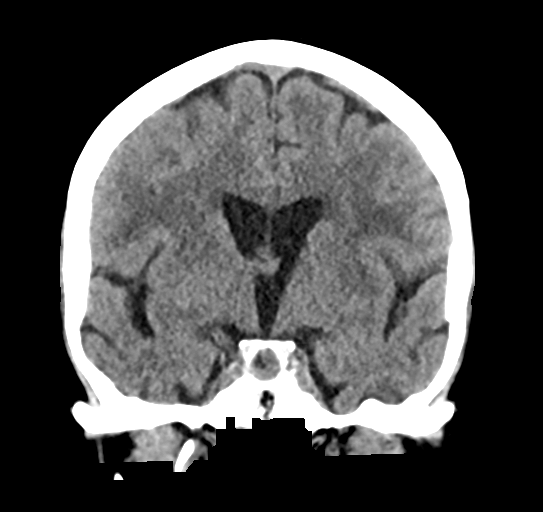
[im 35/63  brain]
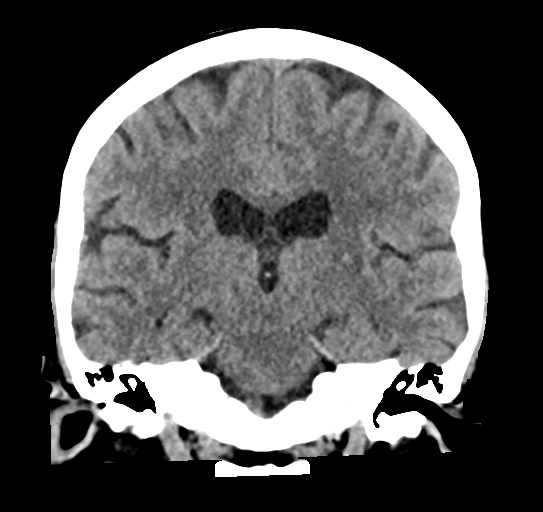

[15 of 40 positions shown; findings below may reference images not displayed]

FINDINGS: Brain: No evidence of acute infarction, hemorrhage, extra-axial
collection, ventriculomegaly, or mass effect. Generalized cerebral
atrophy. Periventricular white matter low attenuation likely
secondary to microangiopathy.

Vascular: Cerebrovascular atherosclerotic calcifications are noted.

Skull: Negative for fracture or focal lesion.

Sinuses/Orbits: Visualized portions of the orbits are unremarkable.
Visualized portions of the paranasal sinuses are unremarkable.
Visualized portions of the mastoid air cells are unremarkable.

Other: None.
IMPRESSION: 1. No acute intracranial pathology.
2. Chronic microvascular disease and cerebral atrophy.

## 2022-01-18 ENCOUNTER — Other Ambulatory Visit: Payer: Self-pay | Admitting: *Deleted

## 2022-01-18 DIAGNOSIS — R350 Frequency of micturition: Secondary | ICD-10-CM

## 2022-01-18 MED ORDER — MIRABEGRON ER 25 MG PO TB24: 25.0000 mg | ORAL_TABLET | Freq: Every day | ORAL | 11 refills | Status: DC

## 2022-01-25 ENCOUNTER — Ambulatory Visit (INDEPENDENT_AMBULATORY_CARE_PROVIDER_SITE_OTHER): Payer: Medicare HMO | Admitting: Physician Assistant

## 2022-01-25 ENCOUNTER — Encounter: Payer: Self-pay | Admitting: Physician Assistant

## 2022-01-25 VITALS — BP 135/80 | HR 69 | Ht 62.0 in | Wt 154.0 lb

## 2022-01-25 DIAGNOSIS — R600 Localized edema: Secondary | ICD-10-CM | POA: Diagnosis not present

## 2022-01-25 DIAGNOSIS — R350 Frequency of micturition: Secondary | ICD-10-CM

## 2022-01-25 DIAGNOSIS — R351 Nocturia: Secondary | ICD-10-CM | POA: Diagnosis not present

## 2022-01-25 LAB — BLADDER SCAN AMB NON-IMAGING: Scan Result: 2

## 2022-01-25 NOTE — Progress Notes (Signed)
01/25/2022 1:58 PM   Brittney Kennedy 08-04-1950 [id number]  CC: Chief Complaint  Patient presents with   Urinary Frequency   HPI: Brittney Kennedy is a 71 y.o. female with PMH OAB wet and microscopic hematuria who presents today for symptom recheck on Myrbetriq 25 mg.  She is accompanied today by her son, a Marine scientist, who contributes to HPI.  Today she reports she is unsure if the Myrbetriq made a significant improvement in her urinary symptoms, however it is cost prohibitive for her at $300 a month through her insurance.    She reports occasional urgency and urge incontinence but is primarily bothered by her nocturia, x2-3.  She states she only voids about 3 times during the day.  She denies a history of BLE edema or OSA. PVR 18m.  Overall, she is unsure how much medications have helped her bladder symptoms, she cannot remember what her symptoms were like before she was on them.  PMH: Past Medical History:  Diagnosis Date   CAD (coronary artery disease)    a. 09/2019 NSTEMI/Cath: LM nl, LAD 90p, 568mdiffuse mid-dist dzs, LCX 9066m groove/OM2, RI 90p, RCA dominant, nl, RPDA 37m49m 25%, glob HK.   Diabetes mellitus, type 2 (HCC)    HFrEF (heart failure with reduced ejection fraction) (HCC)[place] a. 09/2019 LV gram: EF 25%, global HK.   Hypertension    Memory impairment    S/P stroke 09/2019   [facility]   PUD (peptic ulcer disease)    Seasonal allergies    Stroke (HCC)[place]/2021   Walker as ambulation aid    Weakness of left side of body    S/P stroke 09/2019   Wears dentures    full upper and lower    Surgical History: Past Surgical History:  Procedure Laterality Date   CATARACT EXTRACTION W/PHACO Right 08/11/2020   Procedure: CATARACT EXTRACTION PHACO AND INTRAOCULAR LENS PLACEMENT (IOC)[place] 6.42 00:44.0;  Surgeon: PorfBirder Robson;  [place] [place];  Laterality: Right;  Diabetic - oral meds    CHOLECYSTECTOMY     CORONARY STENT INTERVENTION N/A 09/30/2019   Procedure: CORONARY STENT INTERVENTION;  Surgeon: JordMartiniqueter M, MD;  [facility];  Service: Cardiovascular;  Laterality: N/A;   ESOPHAGOGASTRODUODENOSCOPY (EGD) WITH PROPOFOL N/A 10/08/2019   Procedure: ESOPHAGOGASTRODUODENOSCOPY (EGD) WITH PROPOFOL;  Surgeon: ArmbYetta Flock;  [facility]: [facility] Gastroenterology;  Laterality: N/A;   HEMOSTASIS CLIP PLACEMENT  10/08/2019   Procedure: HEMOSTASIS CLIP PLACEMENT;  Surgeon: ArmbYetta Flock;  [facility] Gastroenterology;;   HEMOSTASIS CONTROL  10/08/2019   Procedure: HEMOSTASIS CONTROL;  Surgeon: ArmbYetta Flock;  Location: [facility]ervice: Gastroenterology;;   LEFT HEART CATH AND CORONARY ANGIOGRAPHY N/A 09/30/2019   Procedure: LEFT HEART CATH AND CORONARY ANGIOGRAPHY;  Surgeon: JordMartiniqueter M, MD;  [facility];  Service: Cardiovascular;  Laterality: N/A;   LEFT HEART CATH AND CORS/GRAFTS ANGIOGRAPHY N/A 09/27/2019   Procedure: LEFT HEART CATH AND CORS/GRAFTS ANGIOGRAPHY;  Surgeon: GollMinna Merritts;  Location: [facility];  Service: Cardiovascular;  Laterality: N/A;    Home Medications:  Allergies as of 01/25/2022       Reactions   Onion Anaphylaxis, Hives   (Raw onions)   Monosodium Glutamate Nausea Only   Headache Fatigue        Medication List  Accurate as of January 25, 2022  1:58 PM. If you have any questions, ask your nurse or doctor.          STOP taking these medications    docusate sodium 100 MG capsule Commonly known as: COLACE Stopped by: Debroah Loop, PA-C   traMADol 50 MG tablet Commonly known as: ULTRAM Stopped by: Debroah Loop, PA-C       TAKE these medications    acetaminophen 325 MG tablet Commonly known as: TYLENOL Take 2 tablets (650 mg total) by mouth every 4 (four) hours as needed for mild pain or  headache.   atorvastatin 80 MG tablet Commonly known as: LIPITOR Take 1 tablet (80 mg total) by mouth daily at 6 PM.   Calcium-Vitamin D-Minerals 600-800 MG-UNIT Chew Chew by mouth.   carvedilol 25 MG tablet Commonly known as: COREG Take 1 tablet (25 mg total) by mouth 2 (two) times daily.   clopidogrel 75 MG tablet Commonly known as: PLAVIX Take 1 tablet (75 mg total) by mouth daily with breakfast.   diclofenac Sodium 1 % Gel Commonly known as: VOLTAREN Apply 2 g topically daily as needed.   glipiZIDE 5 MG tablet Commonly known as: GLUCOTROL Take by mouth.   hydrocortisone cream 1 % Apply 1 application topically 2 (two) times daily as needed for itching.   losartan 25 MG tablet Commonly known as: Cozaar Take 1 tablet (25 mg total) by mouth daily. HOLD if SBP <120   magnesium gluconate 500 MG tablet Commonly known as: MAGONATE Take 500 mg by mouth every 8 (eight) hours.   metFORMIN 500 MG tablet Commonly known as: GLUCOPHAGE Take 1 tablet (500 mg total) by mouth 2 (two) times daily with a meal.   mirabegron ER 25 MG Tb24 tablet Commonly known as: MYRBETRIQ Take 1 tablet (25 mg total) by mouth daily.   multivitamin with minerals Tabs tablet Take 1 tablet by mouth daily.   nitroGLYCERIN 0.4 MG SL tablet Commonly known as: NITROSTAT Place 1 tablet (0.4 mg total) under the tongue every 5 (five) minutes as needed for chest pain.   pantoprazole 40 MG tablet Commonly known as: PROTONIX Take 1 tablet (40 mg total) by mouth daily before breakfast.        Allergies:  Allergies  Allergen Reactions   Onion Anaphylaxis and Hives    (Raw onions)   Monosodium Glutamate Nausea Only    Headache Fatigue    Family History: Family History  Problem Relation Age of Onset   Stroke Mother        died @ 95   Multiple myeloma Father        died @ 87   Multiple myeloma Sister     Social History:   reports that she has never smoked. She has never used smokeless  tobacco. She reports that she does not drink alcohol and does not use drugs.  Physical Exam: BP 135/80   Pulse 69   Ht 5' 2"  (1.575 m)   Wt 154 lb (69.9 kg)   BMI 28.17 kg/m   Constitutional:  Alert and oriented, no acute distress, nontoxic appearing HEENT: [place], AT Cardiovascular: No clubbing, cyanosis.  2+ pitting edema of the BLEs, L>R. Respiratory: Normal respiratory effort, no increased work of breathing Skin: No rashes, bruises or suspicious lesions Neurologic: Grossly intact, no focal deficits, moving all 4 extremities Psychiatric: Normal mood and affect  Laboratory Data: Results for orders placed or performed in visit on 01/25/22  Bladder Scan (Post Void  Residual) in office  Result Value Ref Range   Scan Result 2    Assessment & Plan:   1. Urinary frequency Unclear symptomatic improvement on Myrbetriq, however this medication is cost prohibitive.  We discussed that a 90-day supply through the mail order pharmacy would be cheaper and they are considering this alternative.  Given that patient does not recall her baseline urinary symptoms and is unsure which therapies have helped her, we mutually decided to proceed with a drug holiday for 6 weeks with plans for symptom recheck upon completion.  We discussed that my recommended therapeutic options for her include Myrbetriq, trospium, PTNS, Botox, or InterStim.  I do not recommend keeping her on other antimuscarinics given her age.  They are not interested in pursuing InterStim or Botox but may be interested in PTNS.  It does not look like her insurance will cover Gemtesa. - Bladder Scan (Post Void Residual) in office  2. Nocturia We discussed that isolated nocturia tends to be multifactorial in etiology and particularly challenging to treat.  Likely associated with #3 below.  Will address with pharmacotherapy as above.  3. Bilateral lower extremity edema Patient denies, however noted on physical exam today.  We discussed that  this could be contributing to #2 above. Encouraged her to wear compressive socks or prop her feet up at the end of the day before bedtime.  Return in about 6 weeks (around 03/08/2022) for Sx recheck, UA, PVR on drug holiday.  Debroah Loop, PA-C  [facility] [address], [place], [place] [address] [phone]

## 2022-02-17 ENCOUNTER — Other Ambulatory Visit: Payer: Self-pay | Admitting: Physician Assistant

## 2022-02-17 ENCOUNTER — Other Ambulatory Visit: Payer: Self-pay | Admitting: *Deleted

## 2022-02-17 MED ORDER — TROSPIUM CHLORIDE ER 60 MG PO CP24: 1.0000 | ORAL_CAPSULE | Freq: Every day | ORAL | 0 refills | Status: DC

## 2022-02-17 NOTE — Telephone Encounter (Signed)
Spoke with patient and advised results rx sent to pharmacy by e-script  

## 2022-02-17 NOTE — Telephone Encounter (Signed)
Spoke with patient again and advised that I sent rx [facility]

## 2022-03-09 ENCOUNTER — Ambulatory Visit: Payer: PRIVATE HEALTH INSURANCE | Admitting: Physician Assistant

## 2022-03-22 ENCOUNTER — Ambulatory Visit: Payer: PRIVATE HEALTH INSURANCE | Admitting: Physician Assistant

## 2022-03-23 ENCOUNTER — Ambulatory Visit: Payer: PRIVATE HEALTH INSURANCE | Admitting: Physician Assistant

## 2022-03-25 ENCOUNTER — Encounter: Payer: Self-pay | Admitting: Physician Assistant

## 2022-03-25 ENCOUNTER — Ambulatory Visit (INDEPENDENT_AMBULATORY_CARE_PROVIDER_SITE_OTHER): Payer: Medicare HMO | Admitting: Physician Assistant

## 2022-03-25 VITALS — BP 137/82 | HR 67 | Ht 62.0 in | Wt 162.0 lb

## 2022-03-25 DIAGNOSIS — R350 Frequency of micturition: Secondary | ICD-10-CM | POA: Diagnosis not present

## 2022-03-25 DIAGNOSIS — R8271 Bacteriuria: Secondary | ICD-10-CM

## 2022-03-25 LAB — MICROSCOPIC EXAMINATION: WBC, UA: >30 /hpf — AB (ref 0–5)

## 2022-03-25 LAB — URINALYSIS, COMPLETE
Bilirubin, UA: NEGATIVE
Glucose, UA: NEGATIVE
Ketones, UA: NEGATIVE
Nitrite, UA: NEGATIVE
Specific Gravity, UA: 1.015 (ref 1.005–1.030)
Urobilinogen, Ur: 0.2 mg/dL (ref 0.2–1.0)
pH, UA: 5.5 (ref 5.0–7.5)

## 2022-03-25 LAB — BLADDER SCAN AMB NON-IMAGING

## 2022-03-25 MED ORDER — MIRABEGRON ER 50 MG PO TB24: 50.0000 mg | ORAL_TABLET | Freq: Every day | ORAL | 3 refills | Status: DC

## 2022-03-25 NOTE — Patient Instructions (Signed)
[facility] [address] [place], [place] [address]

## 2022-03-25 NOTE — Progress Notes (Unsigned)
03/25/2022 4:19 PM   Brittney Kennedy 1950/11/26 [id number]  CC: Chief Complaint  Patient presents with   Urinary Frequency   HPI: Brittney Kennedy is a 71 y.o. female with PMH OAB wet and microscopic hematuria who presents today for symptom recheck on trospium ER 60 mg daily.  She was previously on Myrbetriq 25 mg but stopped this due to cost.  She is accompanied today by her son, Brittney Kennedy, who contributes to HPI.   Today she reports she thinks she has had improvement in her urinary urgency on trospium.  She continues to have daytime frequency and nocturia every 2 hours.  She is still having urge incontinence.  She has also been doing Kegel exercises this month, so she is unsure if the medication or Kegel exercises are responsible for her symptomatic improvement.  She denies dysuria, flank pain, fever, chills, nausea, or vomiting.  She reports constipation and dry mouth in the past month.  It sounds like she may have some chronic constipation at baseline.  She has been fluid restricting due to her OAB symptoms, but sucks on ice cubes for management of her dry mouth.  [place] recently moved to [place] in [place] and resides in independent living there.  Getting to [facility] from [place] poses a bit of a challenge, as our clinic is outside of the transportation radius from her retirement home.  In-office UA today positive for 1+ blood, 2+ protein, and 1+ leukocytes; urine microscopy with >30 WBCs/HPF, 3-10 RBCs/HPF, and any bacteria. PVR 68m.  PMH: Past Medical History:  Diagnosis Date   CAD (coronary artery disease)    a. 09/2019 NSTEMI/Cath: LM nl, LAD 90p, 578mdiffuse mid-dist dzs, LCX 9028m groove/OM2, RI 90p, RCA dominant, nl, RPDA 2m72m 25%, glob HK.   Diabetes mellitus, type 2 (HCC)    HFrEF (heart failure with reduced ejection fraction) (HCC)[place] a. 09/2019 LV gram: EF 25%, global HK.   Hypertension    Memory impairment    S/P stroke 09/2019   [facility]   PUD (peptic ulcer disease)    Seasonal allergies    Stroke (HCC)[place]/2021   Walker as ambulation aid    Weakness of left side of body    S/P stroke 09/2019   Wears dentures    full upper and lower    Surgical History: Past Surgical History:  Procedure Laterality Date   CATARACT EXTRACTION W/PHACO Right 08/11/2020   Procedure: CATARACT EXTRACTION PHACO AND INTRAOCULAR LENS PLACEMENT (IOC)[place] 6.42 00:44.0;  Surgeon: PorfBirder Robson;  [place] [facility] [facility];  Laterality: Right;  Diabetic - oral meds   CHOLECYSTECTOMY     CORONARY STENT INTERVENTION N/A 09/30/2019   Procedure: CORONARY STENT INTERVENTION;  Surgeon: JordMartiniqueter M, MD;  [facility];  Service: Cardiovascular;  Laterality: N/A;   ESOPHAGOGASTRODUODENOSCOPY (EGD) WITH PROPOFOL N/A 10/08/2019   Procedure: ESOPHAGOGASTRODUODENOSCOPY (EGD) WITH PROPOFOL;  Surgeon: ArmbYetta Flock;  [facility]ology;  Laterality: N/A;   HEMOSTASIS CLIP PLACEMENT  10/08/2019   Procedure: HEMOSTASIS CLIP PLACEMENT;  Surgeon: ArmbYetta Flock;  [facility] [place] Gastroenterology;;   HEMOSTASIS CONTROL  10/08/2019   Procedure: HEMOSTASIS CONTROL;  Surgeon: ArmbYetta Flock;  [facility] Gastroenterology;;   LEFT HEART CATH AND CORONARY ANGIOGRAPHY N/A 09/30/2019   Procedure: LEFT HEART CATH AND CORONARY ANGIOGRAPHY;  Surgeon: JordMartiniqueter M, MD;  Location: [place] [facility] LAB;  Service: Cardiovascular;  Laterality: N/A;   LEFT HEART CATH AND CORS/GRAFTS ANGIOGRAPHY N/A 09/27/2019   Procedure: LEFT HEART CATH AND CORS/GRAFTS ANGIOGRAPHY;  Surgeon: Minna Merritts, MD;  Location: [place] CV LAB;  Service: Cardiovascular;  Laterality: N/A;    Home Medications:  Allergies as of 03/25/2022       Reactions   Onion Anaphylaxis, Hives   (Raw onions)   Monosodium Glutamate Nausea  Only   Headache Fatigue        Medication List        Accurate as of March 25, 2022  4:19 PM. If you have any questions, ask your nurse or doctor.          STOP taking these medications    Trospium Chloride 60 MG Cp24 Stopped by: Debroah Loop, PA-C       TAKE these medications    acetaminophen 325 MG tablet Commonly known as: TYLENOL Take 2 tablets (650 mg total) by mouth every 4 (four) hours as needed for mild pain or headache.   atorvastatin 80 MG tablet Commonly known as: LIPITOR Take 1 tablet (80 mg total) by mouth daily at 6 PM.   Calcium-Vitamin D-Minerals 600-800 MG-UNIT Chew Chew by mouth.   carvedilol 25 MG tablet Commonly known as: COREG Take 1 tablet (25 mg total) by mouth 2 (two) times daily.   clopidogrel 75 MG tablet Commonly known as: PLAVIX Take 1 tablet (75 mg total) by mouth daily with breakfast.   diclofenac Sodium 1 % Gel Commonly known as: VOLTAREN Apply 2 g topically daily as needed.   glipiZIDE 5 MG tablet Commonly known as: GLUCOTROL Take by mouth.   hydrocortisone cream 1 % Apply 1 application topically 2 (two) times daily as needed for itching.   losartan 25 MG tablet Commonly known as: Cozaar Take 1 tablet (25 mg total) by mouth daily. HOLD if SBP <120   magnesium gluconate 500 MG tablet Commonly known as: MAGONATE Take 500 mg by mouth every 8 (eight) hours.   metFORMIN 500 MG tablet Commonly known as: GLUCOPHAGE Take 1 tablet (500 mg total) by mouth 2 (two) times daily with a meal.   mirabegron ER 50 MG Tb24 tablet Commonly known as: MYRBETRIQ Take 1 tablet (50 mg total) by mouth daily. Started by: Debroah Loop, PA-C   multivitamin with minerals Tabs tablet Take 1 tablet by mouth daily.   nitroGLYCERIN 0.4 MG SL tablet Commonly known as: NITROSTAT Place 1 tablet (0.4 mg total) under the tongue every 5 (five) minutes as needed for chest pain.   pantoprazole 40 MG tablet Commonly known  as: PROTONIX Take 1 tablet (40 mg total) by mouth daily before breakfast.        Allergies:  Allergies  Allergen Reactions   Onion Anaphylaxis and Hives    (Raw onions)   Monosodium Glutamate Nausea Only    Headache Fatigue    Family History: Family History  Problem Relation Age of Onset   Stroke Mother        died @ 66   Multiple myeloma Father        died @ 51   Multiple myeloma Sister     Social History:   reports that she has never smoked. She has never used smokeless tobacco. She reports that she does not drink alcohol and does not use drugs.  Physical Exam: BP 137/82   Pulse 67   Ht 5' 2"  (1.575 m)  Wt 162 lb (73.5 kg)   BMI 29.63 kg/m   Constitutional:  Alert and oriented, no acute distress, nontoxic appearing HEENT: [place], AT Cardiovascular: No clubbing, cyanosis, or edema Respiratory: Normal respiratory effort, no increased work of breathing Skin: No rashes, bruises or suspicious lesions Neurologic: Grossly intact, no focal deficits, moving all 4 extremities Psychiatric: Normal mood and affect  Laboratory Data: Results for orders placed or performed in visit on 03/25/22  CULTURE, URINE COMPREHENSIVE   Specimen: Urine   UR  Result Value Ref Range   Urine Culture, Comprehensive Preliminary report (A)    Organism ID, Bacteria Gram negative rods (A)    Organism ID, Bacteria Comment   Microscopic Examination   Urine  Result Value Ref Range   WBC, UA >30 (A) 0 - 5 /hpf   RBC, Urine 3-10 (A) 0 - 2 /hpf   Epithelial Cells (non renal) 0-10 0 - 10 /hpf   Bacteria, UA Many (A) None seen/Few  Urinalysis, Complete  Result Value Ref Range   Specific Gravity, UA 1.015 1.005 - 1.030   pH, UA 5.5 5.0 - 7.5   Color, UA Yellow Yellow   Appearance Ur Cloudy (A) Clear   Leukocytes,UA 1+ (A) Negative   Protein,UA 2+ (A) Negative/Trace   Glucose, UA Negative Negative   Ketones, UA Negative Negative   RBC, UA 1+ (A) Negative   Bilirubin, UA Negative Negative    Urobilinogen, Ur 0.2 0.2 - 1.0 mg/dL   Nitrite, UA Negative Negative   Microscopic Examination See below:   Bladder Scan (Post Void Residual) in office  Result Value Ref Range   Scan Result 0ML    Assessment & Plan:   1. Urinary frequency Slight symptomatic improvement on trospium, however she is having constipation and dry mouth.  We had a lengthy conversation about treatment options today including continuing trospium versus going back to beta 3 agonists versus PTNS.  She will have a hard time coming to clinic regularly for PTNS due to residing in [place].  We discussed transferring care to [facility] so that she has a local urologist, but they wish to defer this at this time.  Ultimately, we elected to go back on Myrbetriq 50 mg daily.  It is slightly cheaper if I send in a 90-day supply to her mail order pharmacy at $400 per 90 days.  I also gave them some samples today. - Bladder Scan (Post Void Residual) in office - mirabegron ER (MYRBETRIQ) 50 MG TB24 tablet; Take 1 tablet (50 mg total) by mouth daily.  Dispense: 90 tablet; Refill: 3  2. Asymptomatic bacteriuria Not clinically infected today.  We discussed signs of infection that would warrant treatment.  We will send for culture today in case she develops symptoms and contact the patient when they become available to confirm that she remains asymptomatic. - Urinalysis, Complete - CULTURE, URINE COMPREHENSIVE   Return in about 1 year (around 03/26/2023) for Annual OAB f/u with PVR.  Debroah Loop, PA-C  [facility] [address], [place], [place] [address] [phone]

## 2022-03-28 LAB — CULTURE, URINE COMPREHENSIVE

## 2023-03-27 ENCOUNTER — Ambulatory Visit (INDEPENDENT_AMBULATORY_CARE_PROVIDER_SITE_OTHER): Payer: Medicare HMO | Admitting: Physician Assistant

## 2023-03-27 VITALS — BP 145/74 | HR 72 | Ht 62.0 in | Wt 178.6 lb

## 2023-03-27 DIAGNOSIS — N3281 Overactive bladder: Secondary | ICD-10-CM

## 2023-03-27 LAB — BLADDER SCAN AMB NON-IMAGING: Scan Result: 14

## 2023-03-27 MED ORDER — MIRABEGRON ER 50 MG PO TB24: 50.0000 mg | ORAL_TABLET | Freq: Every day | ORAL | 3 refills | Status: AC

## 2023-03-27 NOTE — Progress Notes (Signed)
03/27/2023 1:54 PM   Brittney Kennedy 12-03-1950 [id number]  CC: Chief Complaint  Patient presents with   Over Active Bladder   HPI: Brittney Kennedy is a 72 y.o. female with PMH OAB wet and microscopic hematuria who presents today for annual follow-up on Myrbetriq 50 mg.  She is accompanied today by her son, who contributes to HPI.  Today she reports her urinary symptoms are reasonably well-controlled on Myrbetriq.  The cost went down considerably earlier this year when it became available generic.  She denies UTIs or gross hematuria in the past year.  She continues to live in [place], so coming here is a bit of a challenge.  They would like to continue care with our practice for now.  PVR 14mL.  PMH: Past Medical History:  Diagnosis Date   CAD (coronary artery disease)    a. 09/2019 NSTEMI/Cath: LM nl, LAD 90p, 24m, diffuse mid-dist dzs, LCX 38m AV groove/OM2, RI 90p, RCA dominant, nl, RPDA 63m, EF 25%, glob HK.   Diabetes mellitus, type 2 (HCC)    HFrEF (heart failure with reduced ejection fraction) (HCC)    a. 09/2019 LV gram: EF 25%, global HK.   Hypertension    Memory impairment    S/P stroke 09/2019   Myocardial infarction [facility]) 09/2019   PUD (peptic ulcer disease)    Seasonal allergies    Stroke (HCC) 09/2019   Walker as ambulation aid    Weakness of left side of body    S/P stroke 09/2019   Wears dentures    full upper and lower    Surgical History: Past Surgical History:  Procedure Laterality Date   CATARACT EXTRACTION W/PHACO Right 08/11/2020   Procedure: CATARACT EXTRACTION PHACO AND INTRAOCULAR LENS PLACEMENT (IOC) RIGHT DIABETIC 6.42 00:44.0;  Surgeon: Galen Manila, MD;  Location: [facility];  Service: Ophthalmology;  Laterality: Right;  Diabetic - oral meds   CHOLECYSTECTOMY     CORONARY STENT INTERVENTION N/A 09/30/2019   Procedure: CORONARY STENT INTERVENTION;  Surgeon: Swaziland, Peter M, MD;  Location: [facility] INVASIVE CV LAB;  Service:  Cardiovascular;  Laterality: N/A;   ESOPHAGOGASTRODUODENOSCOPY (EGD) WITH PROPOFOL N/A 10/08/2019   Procedure: ESOPHAGOGASTRODUODENOSCOPY (EGD) WITH PROPOFOL;  Surgeon: Benancio Deeds, MD;  Location: [facility] ENDOSCOPY;  Service: Gastroenterology;  Laterality: N/A;   HEMOSTASIS CLIP PLACEMENT  10/08/2019   Procedure: HEMOSTASIS CLIP PLACEMENT;  Surgeon: Benancio Deeds, MD;  Location: [facility] ENDOSCOPY;  Service: Gastroenterology;;   HEMOSTASIS CONTROL  10/08/2019   Procedure: HEMOSTASIS CONTROL;  Surgeon: Benancio Deeds, MD;  Location: [facility] ENDOSCOPY;  Service: Gastroenterology;;   LEFT HEART CATH AND CORONARY ANGIOGRAPHY N/A 09/30/2019   Procedure: LEFT HEART CATH AND CORONARY ANGIOGRAPHY;  Surgeon: Swaziland, Peter M, MD;  Location: [facility] INVASIVE CV LAB;  Service: Cardiovascular;  Laterality: N/A;   LEFT HEART CATH AND CORS/GRAFTS ANGIOGRAPHY N/A 09/27/2019   Procedure: LEFT HEART CATH AND CORS/GRAFTS ANGIOGRAPHY;  Surgeon: Antonieta Iba, MD;  Location: [facility] INVASIVE CV LAB;  Service: Cardiovascular;  Laterality: N/A;    Home Medications:  Allergies as of 03/27/2023       Reactions   Onion Anaphylaxis, Hives   (Raw onions)   Monosodium Glutamate Nausea Only   Headache Fatigue        Medication List        Accurate as of March 27, 2023  1:54 PM. If you have any questions, ask your nurse or doctor.          STOP taking  these medications    glipiZIDE 5 MG tablet Commonly known as: GLUCOTROL Stopped by: Carman Ching       TAKE these medications    acetaminophen 325 MG tablet Commonly known as: TYLENOL Take 2 tablets (650 mg total) by mouth every 4 (four) hours as needed for mild pain or headache.   atorvastatin 80 MG tablet Commonly known as: LIPITOR Take 1 tablet (80 mg total) by mouth daily at 6 PM.   Calcium-Vitamin D-Minerals 600-800 MG-UNIT Chew Chew by mouth.   carvedilol 25 MG tablet Commonly known as: COREG Take 1 tablet (25 mg total) by mouth  2 (two) times daily.   clopidogrel 75 MG tablet Commonly known as: PLAVIX Take 1 tablet (75 mg total) by mouth daily with breakfast.   diclofenac Sodium 1 % Gel Commonly known as: VOLTAREN Apply 2 g topically daily as needed.   hydrocortisone cream 1 % Apply 1 application topically 2 (two) times daily as needed for itching.   losartan 25 MG tablet Commonly known as: Cozaar Take 1 tablet (25 mg total) by mouth daily. HOLD if SBP <120   magnesium gluconate 500 MG tablet Commonly known as: MAGONATE Take 500 mg by mouth every 8 (eight) hours.   metFORMIN 500 MG tablet Commonly known as: GLUCOPHAGE Take 1 tablet (500 mg total) by mouth 2 (two) times daily with a meal.   mirabegron ER 50 MG Tb24 tablet Commonly known as: MYRBETRIQ Take 1 tablet (50 mg total) by mouth daily.   multivitamin with minerals Tabs tablet Take 1 tablet by mouth daily.   nitroGLYCERIN 0.4 MG SL tablet Commonly known as: NITROSTAT Place 1 tablet (0.4 mg total) under the tongue every 5 (five) minutes as needed for chest pain.   pantoprazole 40 MG tablet Commonly known as: PROTONIX Take 1 tablet (40 mg total) by mouth daily before breakfast.        Allergies:  Allergies  Allergen Reactions   Onion Anaphylaxis and Hives    (Raw onions)   Monosodium Glutamate Nausea Only    Headache Fatigue    Family History: Family History  Problem Relation Age of Onset   Stroke Mother        died @ 1   Multiple myeloma Father        died @ 59   Multiple myeloma Sister     Social History:   reports that she has never smoked. She has never used smokeless tobacco. She reports that she does not drink alcohol and does not use drugs.  Physical Exam: BP (!) 145/74   Pulse 72   Ht 5\' 2"  (1.575 m)   Wt 178 lb 9 oz (81 kg)   BMI 32.66 kg/m   Constitutional:  Alert and oriented, no acute distress, nontoxic appearing HEENT: [place], AT Cardiovascular: No clubbing, cyanosis, or edema Respiratory: Normal  respiratory effort, no increased work of breathing Skin: No rashes, bruises or suspicious lesions Neurologic: Grossly intact, no focal deficits, moving all 4 extremities Psychiatric: Normal mood and affect  Laboratory Data: Results for orders placed or performed in visit on 03/27/23  Bladder Scan (Post Void Residual) in office  Result Value Ref Range   Scan Result 14 ml    Assessment & Plan:   1. OAB (overactive bladder) Reasonably good control on Myrbetriq 50 mg daily.  She is emptying appropriately.  Will refill for another year.  We discussed referring her to [facility]rology [facility] if needed in the future.  I do think  she might want a benefit from PTNS, but getting to our clinic is too challenging for her for such frequent office visits. - Bladder Scan (Post Void Residual) in office - mirabegron ER (MYRBETRIQ) 50 MG TB24 tablet; Take 1 tablet (50 mg total) by mouth daily.  Dispense: 90 tablet; Refill: 3   Return in about 1 year (around 03/26/2024) for Annual OAB f/u with UA, PVR.  Carman Ching, PA-C  [facility] [place] [address] [place], [place] [address] [phone]

## 2023-05-06 ENCOUNTER — Other Ambulatory Visit: Payer: Self-pay | Admitting: Physician Assistant

## 2023-05-06 DIAGNOSIS — N3281 Overactive bladder: Secondary | ICD-10-CM

## 2024-03-05 ENCOUNTER — Ambulatory Visit: Admitting: Physician Assistant

## 2024-03-06 ENCOUNTER — Ambulatory Visit: Admitting: Physician Assistant

## 2024-03-25 ENCOUNTER — Ambulatory Visit: Payer: Self-pay | Admitting: Physician Assistant

## 2024-05-06 ENCOUNTER — Other Ambulatory Visit: Payer: Self-pay | Admitting: Physician Assistant

## 2024-05-06 DIAGNOSIS — N3281 Overactive bladder: Secondary | ICD-10-CM
